# Patient Record
Sex: Male | Born: 1965 | Race: White | Hispanic: No | Smoking: Current every day smoker
Health system: Southern US, Community
[De-identification: ages and names within clinical notes are randomized; demographics above are authoritative.]

## PROBLEM LIST (undated history)

## (undated) ENCOUNTER — Emergency Department (HOSPITAL_COMMUNITY): Admission: EM | Source: Home / Self Care

## (undated) DIAGNOSIS — M4800 Spinal stenosis, site unspecified: Secondary | ICD-10-CM

## (undated) DIAGNOSIS — K219 Gastro-esophageal reflux disease without esophagitis: Secondary | ICD-10-CM

## (undated) DIAGNOSIS — F4310 Post-traumatic stress disorder, unspecified: Secondary | ICD-10-CM

## (undated) DIAGNOSIS — K5900 Constipation, unspecified: Secondary | ICD-10-CM

## (undated) DIAGNOSIS — I2699 Other pulmonary embolism without acute cor pulmonale: Secondary | ICD-10-CM

## (undated) DIAGNOSIS — I10 Essential (primary) hypertension: Secondary | ICD-10-CM

## (undated) DIAGNOSIS — IMO0002 Reserved for concepts with insufficient information to code with codable children: Secondary | ICD-10-CM

## (undated) DIAGNOSIS — IMO0001 Reserved for inherently not codable concepts without codable children: Secondary | ICD-10-CM

## (undated) DIAGNOSIS — Z720 Tobacco use: Secondary | ICD-10-CM

## (undated) DIAGNOSIS — F419 Anxiety disorder, unspecified: Secondary | ICD-10-CM

## (undated) DIAGNOSIS — F1910 Other psychoactive substance abuse, uncomplicated: Secondary | ICD-10-CM

## (undated) DIAGNOSIS — F32A Depression, unspecified: Secondary | ICD-10-CM

## (undated) DIAGNOSIS — G709 Myoneural disorder, unspecified: Secondary | ICD-10-CM

## (undated) DIAGNOSIS — R000 Tachycardia, unspecified: Secondary | ICD-10-CM

## (undated) DIAGNOSIS — F329 Major depressive disorder, single episode, unspecified: Secondary | ICD-10-CM

## (undated) DIAGNOSIS — R569 Unspecified convulsions: Secondary | ICD-10-CM

## (undated) DIAGNOSIS — R079 Chest pain, unspecified: Secondary | ICD-10-CM

## (undated) DIAGNOSIS — E669 Obesity, unspecified: Secondary | ICD-10-CM

## (undated) DIAGNOSIS — T1491XA Suicide attempt, initial encounter: Secondary | ICD-10-CM

## (undated) DIAGNOSIS — R32 Unspecified urinary incontinence: Secondary | ICD-10-CM

## (undated) HISTORY — DX: Chest pain, unspecified: R07.9

## (undated) HISTORY — DX: Other psychoactive substance abuse, uncomplicated: F19.10

## (undated) HISTORY — PX: WISDOM TOOTH EXTRACTION: SHX21

## (undated) HISTORY — DX: Major depressive disorder, single episode, unspecified: F32.9

## (undated) HISTORY — DX: Myoneural disorder, unspecified: G70.9

## (undated) HISTORY — DX: Depression, unspecified: F32.A

---

## 1998-02-05 ENCOUNTER — Emergency Department (HOSPITAL_COMMUNITY): Admission: EM | Admit: 1998-02-05 | Discharge: 1998-02-05 | Payer: Self-pay | Admitting: Internal Medicine

## 1998-05-21 ENCOUNTER — Emergency Department (HOSPITAL_COMMUNITY): Admission: EM | Admit: 1998-05-21 | Discharge: 1998-05-21 | Payer: Self-pay | Admitting: Emergency Medicine

## 1999-01-12 ENCOUNTER — Emergency Department (HOSPITAL_COMMUNITY): Admission: EM | Admit: 1999-01-12 | Discharge: 1999-01-12 | Payer: Self-pay | Admitting: *Deleted

## 1999-01-19 ENCOUNTER — Encounter: Payer: Self-pay | Admitting: Emergency Medicine

## 1999-01-19 ENCOUNTER — Emergency Department (HOSPITAL_COMMUNITY): Admission: EM | Admit: 1999-01-19 | Discharge: 1999-01-20 | Payer: Self-pay | Admitting: Emergency Medicine

## 1999-08-01 ENCOUNTER — Encounter: Payer: Self-pay | Admitting: Emergency Medicine

## 1999-08-01 ENCOUNTER — Emergency Department (HOSPITAL_COMMUNITY): Admission: EM | Admit: 1999-08-01 | Discharge: 1999-08-01 | Payer: Self-pay

## 1999-08-04 ENCOUNTER — Emergency Department (HOSPITAL_COMMUNITY): Admission: EM | Admit: 1999-08-04 | Discharge: 1999-08-04 | Payer: Self-pay | Admitting: Emergency Medicine

## 1999-08-04 ENCOUNTER — Encounter: Payer: Self-pay | Admitting: Emergency Medicine

## 2000-08-03 ENCOUNTER — Emergency Department (HOSPITAL_COMMUNITY): Admission: EM | Admit: 2000-08-03 | Discharge: 2000-08-03 | Payer: Self-pay | Admitting: Emergency Medicine

## 2000-09-05 ENCOUNTER — Emergency Department (HOSPITAL_COMMUNITY): Admission: EM | Admit: 2000-09-05 | Discharge: 2000-09-05 | Payer: Self-pay | Admitting: Emergency Medicine

## 2000-09-05 ENCOUNTER — Encounter: Payer: Self-pay | Admitting: Emergency Medicine

## 2000-09-16 ENCOUNTER — Encounter: Admission: RE | Admit: 2000-09-16 | Discharge: 2000-09-16 | Payer: Self-pay | Admitting: Internal Medicine

## 2000-09-30 ENCOUNTER — Encounter: Admission: RE | Admit: 2000-09-30 | Discharge: 2000-09-30 | Payer: Self-pay | Admitting: Internal Medicine

## 2000-10-17 ENCOUNTER — Encounter: Admission: RE | Admit: 2000-10-17 | Discharge: 2000-10-17 | Payer: Self-pay | Admitting: Internal Medicine

## 2000-11-19 ENCOUNTER — Encounter: Admission: RE | Admit: 2000-11-19 | Discharge: 2000-11-19 | Payer: Self-pay | Admitting: Internal Medicine

## 2000-12-02 ENCOUNTER — Encounter: Admission: RE | Admit: 2000-12-02 | Discharge: 2000-12-02 | Payer: Self-pay | Admitting: Internal Medicine

## 2001-03-13 ENCOUNTER — Encounter: Admission: RE | Admit: 2001-03-13 | Discharge: 2001-03-13 | Payer: Self-pay | Admitting: *Deleted

## 2001-03-31 ENCOUNTER — Emergency Department (HOSPITAL_COMMUNITY): Admission: EM | Admit: 2001-03-31 | Discharge: 2001-03-31 | Payer: Self-pay | Admitting: *Deleted

## 2001-04-10 ENCOUNTER — Encounter: Admission: RE | Admit: 2001-04-10 | Discharge: 2001-04-10 | Payer: Self-pay | Admitting: Internal Medicine

## 2001-04-21 ENCOUNTER — Emergency Department (HOSPITAL_COMMUNITY): Admission: EM | Admit: 2001-04-21 | Discharge: 2001-04-21 | Payer: Self-pay

## 2001-05-08 ENCOUNTER — Ambulatory Visit (HOSPITAL_COMMUNITY): Admission: RE | Admit: 2001-05-08 | Discharge: 2001-05-08 | Payer: Self-pay | Admitting: *Deleted

## 2001-05-17 ENCOUNTER — Ambulatory Visit (HOSPITAL_COMMUNITY): Admission: RE | Admit: 2001-05-17 | Discharge: 2001-05-17 | Payer: Self-pay | Admitting: *Deleted

## 2001-05-17 ENCOUNTER — Encounter: Payer: Self-pay | Admitting: *Deleted

## 2001-05-25 ENCOUNTER — Encounter: Admission: RE | Admit: 2001-05-25 | Discharge: 2001-05-25 | Payer: Self-pay | Admitting: *Deleted

## 2001-07-09 ENCOUNTER — Encounter: Admission: RE | Admit: 2001-07-09 | Discharge: 2001-07-09 | Payer: Self-pay | Admitting: Internal Medicine

## 2001-07-15 ENCOUNTER — Encounter: Admission: RE | Admit: 2001-07-15 | Discharge: 2001-07-15 | Payer: Self-pay | Admitting: Internal Medicine

## 2001-07-19 ENCOUNTER — Encounter: Payer: Self-pay | Admitting: Emergency Medicine

## 2001-07-19 ENCOUNTER — Emergency Department (HOSPITAL_COMMUNITY): Admission: EM | Admit: 2001-07-19 | Discharge: 2001-07-19 | Payer: Self-pay | Admitting: Emergency Medicine

## 2001-07-22 ENCOUNTER — Encounter: Admission: RE | Admit: 2001-07-22 | Discharge: 2001-07-22 | Payer: Self-pay | Admitting: Internal Medicine

## 2001-07-30 ENCOUNTER — Emergency Department (HOSPITAL_COMMUNITY): Admission: EM | Admit: 2001-07-30 | Discharge: 2001-07-30 | Payer: Self-pay | Admitting: Emergency Medicine

## 2001-07-30 ENCOUNTER — Encounter: Payer: Self-pay | Admitting: Emergency Medicine

## 2001-07-31 ENCOUNTER — Encounter: Admission: RE | Admit: 2001-07-31 | Discharge: 2001-07-31 | Payer: Self-pay | Admitting: Internal Medicine

## 2001-09-10 ENCOUNTER — Encounter: Admission: RE | Admit: 2001-09-10 | Discharge: 2001-09-10 | Payer: Self-pay | Admitting: Internal Medicine

## 2001-09-25 ENCOUNTER — Emergency Department (HOSPITAL_COMMUNITY): Admission: EM | Admit: 2001-09-25 | Discharge: 2001-09-25 | Payer: Self-pay | Admitting: Emergency Medicine

## 2001-09-30 ENCOUNTER — Encounter: Admission: RE | Admit: 2001-09-30 | Discharge: 2001-09-30 | Payer: Self-pay | Admitting: Internal Medicine

## 2001-11-23 ENCOUNTER — Encounter: Admission: RE | Admit: 2001-11-23 | Discharge: 2001-11-23 | Payer: Self-pay | Admitting: Internal Medicine

## 2002-01-11 ENCOUNTER — Encounter: Admission: RE | Admit: 2002-01-11 | Discharge: 2002-01-11 | Payer: Self-pay | Admitting: Internal Medicine

## 2002-02-21 ENCOUNTER — Emergency Department (HOSPITAL_COMMUNITY): Admission: EM | Admit: 2002-02-21 | Discharge: 2002-02-21 | Payer: Self-pay | Admitting: Emergency Medicine

## 2002-02-21 ENCOUNTER — Encounter: Payer: Self-pay | Admitting: *Deleted

## 2002-03-05 ENCOUNTER — Encounter: Admission: RE | Admit: 2002-03-05 | Discharge: 2002-03-05 | Payer: Self-pay | Admitting: Internal Medicine

## 2002-03-12 ENCOUNTER — Encounter: Admission: RE | Admit: 2002-03-12 | Discharge: 2002-03-12 | Payer: Self-pay | Admitting: Internal Medicine

## 2002-03-26 ENCOUNTER — Encounter: Admission: RE | Admit: 2002-03-26 | Discharge: 2002-03-26 | Payer: Self-pay | Admitting: Internal Medicine

## 2002-06-10 ENCOUNTER — Encounter: Admission: RE | Admit: 2002-06-10 | Discharge: 2002-06-10 | Payer: Self-pay | Admitting: Internal Medicine

## 2002-07-07 ENCOUNTER — Encounter: Admission: RE | Admit: 2002-07-07 | Discharge: 2002-07-07 | Payer: Self-pay | Admitting: Internal Medicine

## 2002-07-17 ENCOUNTER — Emergency Department (HOSPITAL_COMMUNITY): Admission: EM | Admit: 2002-07-17 | Discharge: 2002-07-17 | Payer: Self-pay | Admitting: Emergency Medicine

## 2002-08-06 ENCOUNTER — Encounter: Admission: RE | Admit: 2002-08-06 | Discharge: 2002-08-06 | Payer: Self-pay | Admitting: Internal Medicine

## 2002-09-16 ENCOUNTER — Encounter: Admission: RE | Admit: 2002-09-16 | Discharge: 2002-09-16 | Payer: Self-pay | Admitting: Internal Medicine

## 2002-11-10 ENCOUNTER — Encounter: Admission: RE | Admit: 2002-11-10 | Discharge: 2002-11-10 | Payer: Self-pay | Admitting: Internal Medicine

## 2002-11-10 ENCOUNTER — Encounter: Payer: Self-pay | Admitting: Internal Medicine

## 2002-11-10 ENCOUNTER — Ambulatory Visit (HOSPITAL_COMMUNITY): Admission: RE | Admit: 2002-11-10 | Discharge: 2002-11-10 | Payer: Self-pay | Admitting: Internal Medicine

## 2002-11-15 ENCOUNTER — Encounter: Payer: Self-pay | Admitting: Internal Medicine

## 2002-11-15 ENCOUNTER — Ambulatory Visit (HOSPITAL_COMMUNITY): Admission: RE | Admit: 2002-11-15 | Discharge: 2002-11-15 | Payer: Self-pay | Admitting: Internal Medicine

## 2002-11-17 ENCOUNTER — Encounter: Admission: RE | Admit: 2002-11-17 | Discharge: 2002-11-17 | Payer: Self-pay | Admitting: Internal Medicine

## 2002-11-30 ENCOUNTER — Encounter: Admission: RE | Admit: 2002-11-30 | Discharge: 2002-11-30 | Payer: Self-pay | Admitting: Internal Medicine

## 2002-12-19 ENCOUNTER — Emergency Department (HOSPITAL_COMMUNITY): Admission: EM | Admit: 2002-12-19 | Discharge: 2002-12-19 | Payer: Self-pay | Admitting: Emergency Medicine

## 2002-12-21 ENCOUNTER — Encounter: Payer: Self-pay | Admitting: Emergency Medicine

## 2002-12-21 ENCOUNTER — Inpatient Hospital Stay (HOSPITAL_COMMUNITY): Admission: EM | Admit: 2002-12-21 | Discharge: 2002-12-22 | Payer: Self-pay | Admitting: Emergency Medicine

## 2002-12-23 ENCOUNTER — Encounter: Admission: RE | Admit: 2002-12-23 | Discharge: 2002-12-23 | Payer: Self-pay | Admitting: Internal Medicine

## 2003-02-04 ENCOUNTER — Emergency Department (HOSPITAL_COMMUNITY): Admission: EM | Admit: 2003-02-04 | Discharge: 2003-02-04 | Payer: Self-pay | Admitting: Emergency Medicine

## 2003-04-07 ENCOUNTER — Encounter: Admission: RE | Admit: 2003-04-07 | Discharge: 2003-04-07 | Payer: Self-pay | Admitting: Internal Medicine

## 2003-04-13 ENCOUNTER — Encounter: Admission: RE | Admit: 2003-04-13 | Discharge: 2003-04-13 | Payer: Self-pay | Admitting: *Deleted

## 2003-06-13 ENCOUNTER — Encounter: Admission: RE | Admit: 2003-06-13 | Discharge: 2003-06-13 | Payer: Self-pay | Admitting: Internal Medicine

## 2003-06-16 ENCOUNTER — Encounter: Admission: RE | Admit: 2003-06-16 | Discharge: 2003-06-16 | Payer: Self-pay | Admitting: Internal Medicine

## 2003-06-16 ENCOUNTER — Ambulatory Visit (HOSPITAL_COMMUNITY): Admission: RE | Admit: 2003-06-16 | Discharge: 2003-06-16 | Payer: Self-pay | Admitting: Internal Medicine

## 2003-08-10 ENCOUNTER — Encounter: Admission: RE | Admit: 2003-08-10 | Discharge: 2003-08-10 | Payer: Self-pay | Admitting: Internal Medicine

## 2003-10-10 ENCOUNTER — Encounter: Admission: RE | Admit: 2003-10-10 | Discharge: 2003-10-10 | Payer: Self-pay | Admitting: Internal Medicine

## 2003-11-09 ENCOUNTER — Ambulatory Visit: Payer: Self-pay | Admitting: Internal Medicine

## 2003-12-19 ENCOUNTER — Emergency Department (HOSPITAL_COMMUNITY): Admission: EM | Admit: 2003-12-19 | Discharge: 2003-12-20 | Payer: Self-pay | Admitting: Emergency Medicine

## 2003-12-31 ENCOUNTER — Emergency Department (HOSPITAL_COMMUNITY): Admission: EM | Admit: 2003-12-31 | Discharge: 2003-12-31 | Payer: Self-pay | Admitting: Emergency Medicine

## 2004-02-08 ENCOUNTER — Emergency Department (HOSPITAL_COMMUNITY): Admission: EM | Admit: 2004-02-08 | Discharge: 2004-02-08 | Payer: Self-pay | Admitting: Emergency Medicine

## 2004-03-18 ENCOUNTER — Emergency Department (HOSPITAL_COMMUNITY): Admission: EM | Admit: 2004-03-18 | Discharge: 2004-03-18 | Payer: Self-pay | Admitting: Emergency Medicine

## 2004-05-22 ENCOUNTER — Emergency Department (HOSPITAL_COMMUNITY): Admission: EM | Admit: 2004-05-22 | Discharge: 2004-05-22 | Payer: Self-pay | Admitting: Emergency Medicine

## 2004-07-02 ENCOUNTER — Emergency Department (HOSPITAL_COMMUNITY): Admission: EM | Admit: 2004-07-02 | Discharge: 2004-07-02 | Payer: Self-pay | Admitting: Emergency Medicine

## 2004-08-20 ENCOUNTER — Ambulatory Visit: Payer: Self-pay | Admitting: Internal Medicine

## 2005-10-14 ENCOUNTER — Emergency Department (HOSPITAL_COMMUNITY): Admission: EM | Admit: 2005-10-14 | Discharge: 2005-10-14 | Payer: Self-pay | Admitting: Emergency Medicine

## 2005-10-20 ENCOUNTER — Emergency Department (HOSPITAL_COMMUNITY): Admission: EM | Admit: 2005-10-20 | Discharge: 2005-10-20 | Payer: Self-pay | Admitting: Emergency Medicine

## 2005-10-24 ENCOUNTER — Emergency Department (HOSPITAL_COMMUNITY): Admission: EM | Admit: 2005-10-24 | Discharge: 2005-10-24 | Payer: Self-pay | Admitting: Emergency Medicine

## 2005-10-28 ENCOUNTER — Emergency Department (HOSPITAL_COMMUNITY): Admission: EM | Admit: 2005-10-28 | Discharge: 2005-10-28 | Payer: Self-pay | Admitting: Emergency Medicine

## 2006-02-18 DIAGNOSIS — K219 Gastro-esophageal reflux disease without esophagitis: Secondary | ICD-10-CM | POA: Insufficient documentation

## 2006-02-18 DIAGNOSIS — F3289 Other specified depressive episodes: Secondary | ICD-10-CM | POA: Insufficient documentation

## 2006-02-18 DIAGNOSIS — R519 Headache, unspecified: Secondary | ICD-10-CM | POA: Insufficient documentation

## 2006-02-18 DIAGNOSIS — R51 Headache: Secondary | ICD-10-CM

## 2006-02-18 DIAGNOSIS — E7800 Pure hypercholesterolemia, unspecified: Secondary | ICD-10-CM | POA: Insufficient documentation

## 2006-02-18 DIAGNOSIS — F329 Major depressive disorder, single episode, unspecified: Secondary | ICD-10-CM

## 2006-02-18 DIAGNOSIS — E876 Hypokalemia: Secondary | ICD-10-CM

## 2006-03-06 ENCOUNTER — Ambulatory Visit: Payer: Self-pay | Admitting: Internal Medicine

## 2006-04-01 ENCOUNTER — Ambulatory Visit: Payer: Self-pay | Admitting: Internal Medicine

## 2006-04-03 ENCOUNTER — Ambulatory Visit (HOSPITAL_COMMUNITY): Admission: RE | Admit: 2006-04-03 | Discharge: 2006-04-03 | Payer: Self-pay | Admitting: Internal Medicine

## 2006-05-02 ENCOUNTER — Ambulatory Visit: Payer: Self-pay | Admitting: Internal Medicine

## 2006-05-22 ENCOUNTER — Ambulatory Visit: Payer: Self-pay | Admitting: Internal Medicine

## 2006-10-20 ENCOUNTER — Ambulatory Visit: Payer: Self-pay | Admitting: Internal Medicine

## 2006-11-19 ENCOUNTER — Encounter (INDEPENDENT_AMBULATORY_CARE_PROVIDER_SITE_OTHER): Payer: Self-pay | Admitting: *Deleted

## 2007-03-25 ENCOUNTER — Ambulatory Visit: Payer: Self-pay | Admitting: Internal Medicine

## 2007-03-25 LAB — CONVERTED CEMR LAB
BUN: 8 mg/dL (ref 6–23)
CO2: 27 meq/L (ref 19–32)
Chloride: 96 meq/L (ref 96–112)
Cholesterol: 184 mg/dL (ref 0–200)
Creatinine, Ser: 0.96 mg/dL (ref 0.40–1.50)
Glucose, Bld: 133 mg/dL — ABNORMAL HIGH (ref 70–99)
Sodium: 138 meq/L (ref 135–145)
Total Bilirubin: 0.7 mg/dL (ref 0.3–1.2)
Total CHOL/HDL Ratio: 5.6
VLDL: 58 mg/dL — ABNORMAL HIGH (ref 0–40)

## 2007-05-28 ENCOUNTER — Ambulatory Visit: Payer: Self-pay | Admitting: Internal Medicine

## 2007-05-29 ENCOUNTER — Ambulatory Visit: Payer: Self-pay | Admitting: *Deleted

## 2007-07-23 ENCOUNTER — Ambulatory Visit: Payer: Self-pay | Admitting: Internal Medicine

## 2007-08-26 ENCOUNTER — Emergency Department (HOSPITAL_COMMUNITY): Admission: EM | Admit: 2007-08-26 | Discharge: 2007-08-26 | Payer: Self-pay | Admitting: Emergency Medicine

## 2007-11-24 ENCOUNTER — Ambulatory Visit: Payer: Self-pay | Admitting: Internal Medicine

## 2007-11-25 ENCOUNTER — Encounter (INDEPENDENT_AMBULATORY_CARE_PROVIDER_SITE_OTHER): Payer: Self-pay | Admitting: Internal Medicine

## 2007-11-25 LAB — CONVERTED CEMR LAB
Leukocytes, UA: NEGATIVE
PSA: 0.33 ng/mL (ref 0.10–4.00)
Protein, ur: 30 mg/dL — AB

## 2008-01-07 ENCOUNTER — Emergency Department (HOSPITAL_COMMUNITY): Admission: EM | Admit: 2008-01-07 | Discharge: 2008-01-07 | Payer: Self-pay | Admitting: Emergency Medicine

## 2008-01-13 ENCOUNTER — Emergency Department (HOSPITAL_COMMUNITY): Admission: EM | Admit: 2008-01-13 | Discharge: 2008-01-13 | Payer: Self-pay | Admitting: Emergency Medicine

## 2008-03-27 ENCOUNTER — Emergency Department (HOSPITAL_COMMUNITY): Admission: EM | Admit: 2008-03-27 | Discharge: 2008-03-27 | Payer: Self-pay | Admitting: Emergency Medicine

## 2008-04-07 ENCOUNTER — Emergency Department (HOSPITAL_COMMUNITY): Admission: EM | Admit: 2008-04-07 | Discharge: 2008-04-08 | Payer: Self-pay | Admitting: Emergency Medicine

## 2008-04-11 ENCOUNTER — Emergency Department (HOSPITAL_COMMUNITY): Admission: EM | Admit: 2008-04-11 | Discharge: 2008-04-11 | Payer: Self-pay | Admitting: Emergency Medicine

## 2008-04-20 ENCOUNTER — Ambulatory Visit: Payer: Self-pay | Admitting: Internal Medicine

## 2008-11-15 ENCOUNTER — Ambulatory Visit: Payer: Self-pay | Admitting: Internal Medicine

## 2008-11-15 LAB — CONVERTED CEMR LAB
ALT: 25 units/L (ref 0–53)
AST: 32 units/L (ref 0–37)
Albumin: 3.9 g/dL (ref 3.5–5.2)
Alkaline Phosphatase: 107 units/L (ref 39–117)
CO2: 29 meq/L (ref 19–32)
Chloride: 101 meq/L (ref 96–112)
Glucose, Bld: 115 mg/dL — ABNORMAL HIGH (ref 70–99)
Total Bilirubin: 0.4 mg/dL (ref 0.3–1.2)
Triglycerides: 337 mg/dL — ABNORMAL HIGH (ref <150)

## 2009-07-24 ENCOUNTER — Ambulatory Visit: Payer: Self-pay | Admitting: Internal Medicine

## 2009-11-30 ENCOUNTER — Telehealth (INDEPENDENT_AMBULATORY_CARE_PROVIDER_SITE_OTHER): Payer: Self-pay | Admitting: *Deleted

## 2010-03-24 ENCOUNTER — Encounter: Payer: Self-pay | Admitting: *Deleted

## 2010-04-03 NOTE — Progress Notes (Signed)
   Walk in Patient Form Recieved " Pt dropped Off paper for Travis [name]" sent to [facility]  November 30, 2009 2:07 PM

## 2010-07-20 NOTE — Discharge Summary (Signed)
Travis [name], MINIX NO.:  [id number]   MEDICAL RECORD NO.:  [id number]                   PATIENT TYPE:  INP   LOCATION:  3738                                 FACILITY:  [facility]   PHYSICIAN:  Fransisco Hertz, M.D.               DATE OF BIRTH:  Jan 04, 1966   DATE OF ADMISSION:  12/21/2002  DATE OF DISCHARGE:  12/22/2002                                 DISCHARGE SUMMARY   DISCHARGE DIAGNOSES:  1. Chest pain, noncardiac.  2. Chronic low back pain.  3. Hypertension.  4. Depression.  5. Anxiety.   There were no consults or procedures done while in the hospital.   MEDICATIONS:  1. Hydrochlorothiazide 25 mg p.o. q.d.  2. Cozaar 100 mg p.o. q.d. The patient was to fill this for a few days and     discuss with Dr. Alfonse Alpers about the possibility of changing to an ACE     inhibitor secondary to cost reasons.  3. Vicodin HP; the patient given #6 with no refills as he has a pain     contract with Dr. Alfonse Alpers in the outpatient clinic.  4. K-Dur 10 mEq one p.o. q.d.  5. Prilosec OTC one q.d.   DIET:  Low fat, low sugar, and carbohydrate diet.   FOLLOW UP:  With Dr. Alfonse Alpers at 3:30 p.m. on December 23, 2002.   BRIEF HISTORY:  Travis [name] is a 45 year old white male who fell on his  sacrum two days prior to admission and then several hours later started  having chest pain and pressure like someone sitting on his chest. It was  nonradiating, substernal, associated with nausea but no diaphoresis,  worsened with staying in one position but was better with movement. He  states the pain was not relieved by his pain medications, Vicodin HP, which  he was taking at home but was relieved by the Vicodin in the emergency  department. He was admitted for rule out MI secondary to his story as well  as his risk factors of hypertension, obesity, and smoking two packs a day  for eight years for which he quit two months ago and positive family history  of cardiac disease in both  parents and siblings. He also had initial blood  pressure of 165/119 which was relieved by his home doses of his medications;  had not been taking them secondary to financial reasons for the past one  week.   HOSPITAL COURSE:  1. Chest pain. The patient was admitted to rule out MI with serial enzymes     as well as EKGs. MI was ruled out, and the patient was sent home with     precautions and reassurance and without chest pain as it was improved and     relieved with Vicodin.  2. Hypertension. Not well controlled likely secondary to not taking     medications secondary to  cost.  Is awaiting health insurance in the next     few weeks. Continued him on his home medications in hospital with     improved but still not ideally controlled of 140/98. He had an already     scheduled appointment the day after discharge with his primary care     Sharyah Bostwick, and they will discuss this at this time. He was discharged on     hospital day #2 in good     condition. He states that he was back at baseline with no chest pain and     would followup with Dr. Alfonse Alpers. Followup should especially focus on     patient's psychological depression and anxiety symptoms as this seems to     be quite an issue for him in his every day life as well as how he     functions with his chronic pain.      Ace Gins, MD                        Fransisco Hertz, M.D.    JS/MEDQ  D:  01/18/2003  T:  01/19/2003  Job:  [id number]

## 2010-12-04 LAB — POCT CARDIAC MARKERS
CKMB, poc: <1 — ABNORMAL LOW
Myoglobin, poc: 99.2

## 2010-12-04 LAB — POCT I-STAT, CHEM 8
Chloride: 101
Creatinine, Ser: 0.8
Glucose, Bld: 126 — ABNORMAL HIGH
HCT: 48
Potassium: 3.4 — ABNORMAL LOW

## 2010-12-11 ENCOUNTER — Other Ambulatory Visit: Payer: Self-pay | Admitting: Internal Medicine

## 2010-12-11 NOTE — Telephone Encounter (Signed)
Not an [facility] patient.

## 2010-12-17 ENCOUNTER — Other Ambulatory Visit: Payer: Self-pay | Admitting: Internal Medicine

## 2011-03-20 ENCOUNTER — Emergency Department (HOSPITAL_COMMUNITY)
Admission: EM | Admit: 2011-03-20 | Discharge: 2011-03-20 | Disposition: A | Discharge status: Home / Self Care | Payer: Self-pay | Attending: Emergency Medicine | Admitting: Emergency Medicine

## 2011-03-20 ENCOUNTER — Encounter (HOSPITAL_COMMUNITY): Payer: Self-pay | Admitting: Emergency Medicine

## 2011-03-20 ENCOUNTER — Other Ambulatory Visit: Payer: Self-pay

## 2011-03-20 DIAGNOSIS — F410 Panic disorder [episodic paroxysmal anxiety] without agoraphobia: Secondary | ICD-10-CM

## 2011-03-20 DIAGNOSIS — I10 Essential (primary) hypertension: Secondary | ICD-10-CM | POA: Insufficient documentation

## 2011-03-20 DIAGNOSIS — R4589 Other symptoms and signs involving emotional state: Secondary | ICD-10-CM | POA: Insufficient documentation

## 2011-03-20 DIAGNOSIS — I498 Other specified cardiac arrhythmias: Secondary | ICD-10-CM | POA: Insufficient documentation

## 2011-03-20 HISTORY — DX: Essential (primary) hypertension: I10

## 2011-03-20 MED ORDER — LORAZEPAM 1 MG PO TABS
1.0000 mg | ORAL_TABLET | Freq: Once | ORAL | Status: AC
  Administered 2011-03-20: 1 mg via ORAL
  Filled 2011-03-20: qty 1

## 2011-03-20 MED ORDER — LORAZEPAM 1 MG PO TABS: 1.0000 mg | ORAL_TABLET | Freq: Three times a day (TID) | ORAL | Status: AC | PRN

## 2011-03-20 NOTE — ED Notes (Signed)
Pt states that his father had a stroke on fri and is in the hospital and that today he began having a racing heart and breathing heavy, states that he has been dx with panic attacks and is suppose to see md on 1/21 for this but states that he can not calm down, was on bp meds and ativan previously but not now.

## 2011-03-20 NOTE — ED Provider Notes (Signed)
History     CSN: [id number]  Arrival date & time 03/20/11  0825   First MD Initiated Contact with Patient 03/20/11 [phone]      Chief Complaint  Patient presents with  . Panic Attack    (Consider location/radiation/quality/duration/timing/severity/associated sxs/prior treatment) HPI  46 year old male presenting to the ED complaining of panic attack. Patient states he has a history of panic attack. His father was recently diagnosed with having a stroke and currently in the hospital. Patient believes that this may be the precipitant to his panic attack. He described sensation of heart beating fast, increase anxiety, and unable to sleep.  Patient denies smoking, alcohol use, or recreational drug use. He denies homicidal or suicidal ideation. He denies any change in his medication. Patient denies headache, chest pain, abdominal pain, or dysuria. Patient denies rash. Patient is scheduled to see his primary care doctor on January 21 for further management of his panic attack.  Past Medical History  Diagnosis Date  . Thyroid disease   . Hypertension     No past surgical history on file.  No family history on file.  History  Substance Use Topics  . Smoking status: Former Games developer  . Smokeless tobacco: Not on file  . Alcohol Use: No      Review of Systems  All other systems reviewed and are negative.    Allergies  Penicillins  Home Medications   Current Outpatient Rx  Name Route Sig Dispense Refill  . ACETAMINOPHEN 500 MG PO TABS Oral Take 1,000 mg by mouth every 6 (six) hours as needed. For pain.    Marland Kitchen VITAMIN B-12 PO Oral Take 1 tablet by mouth daily.    Marland Kitchen HYDROCHLOROTHIAZIDE PO Oral Take 1 tablet by mouth daily.    . IBUPROFEN 200 MG PO TABS Oral Take 600 mg by mouth every 6 (six) hours as needed. For pain.    Marland Kitchen FISH OIL PO Oral Take 1 capsule by mouth daily.      BP 148/102  Pulse 122  Temp(Src) 98.4 F (36.9 C) (Oral)  Resp 24  SpO2 98%  Physical Exam  Nursing  note and vitals reviewed. Constitutional:       Awake, alert, nontoxic appearance  HENT:  Head: Atraumatic.  Eyes: Right eye exhibits no discharge. Left eye exhibits no discharge.  Neck: Neck supple.  Pulmonary/Chest: Effort normal. He exhibits no tenderness.  Abdominal: There is no tenderness. There is no rebound.  Musculoskeletal: He exhibits no tenderness.       Baseline ROM, no obvious new focal weakness  Neurological:       Mental status and motor strength appears baseline for patient and situation  Skin: No rash noted.  Psychiatric: He has a normal mood and affect.    ED Course  Procedures (including critical care time)  Labs Reviewed - No data to display No results found.   No diagnosis found.   Date: 03/20/2011  Rate: 107  Rhythm: sinus tachycardia  QRS Axis: normal  Intervals: normal  ST/T Wave abnormalities: nonspecific T wave changes  Conduction Disutrbances: normal  Narrative Interpretation:   Old EKG Reviewed: unchanged    MDM  Hx of panic attack, with recent exacerbation.  No concerning behavioral red flags finding.  Tachycardia and tremors but otherwise in no other acute distress.  PO ativan given.  Will give short course of ativan at discharge.  Pt agrees to f/u with PCP as previously scheduled for further evaluation.    11:32 AM Patient  state he felt much better after the administrations of Ativan. Patient is to follow up with his doctor shortly.      Fayrene Helper, PA-C 03/20/11 1132

## 2011-03-21 NOTE — ED Provider Notes (Signed)
Medical screening examination/treatment/procedure(s) were conducted as a shared visit with non-physician practitioner(s) and myself.  I personally evaluated the patient during the encounter  Pt c/o 'anxiety attack', states long hx same and feels the same. Recent stressors. Denies depression or thoughts of self harm. No head intol, sweats, wt change. No change in meds x states has no medication for anxiety. Alert, anxious. Chest cta. No thyromegaly. Rrr. No g/r/m. abd soft nt.   Suzi Roots, MD 03/21/11 0730

## 2011-04-15 ENCOUNTER — Emergency Department (HOSPITAL_COMMUNITY)
Admission: EM | Admit: 2011-04-15 | Discharge: 2011-04-15 | Disposition: A | Discharge status: Home / Self Care | Payer: Self-pay | Attending: Emergency Medicine | Admitting: Emergency Medicine

## 2011-04-15 ENCOUNTER — Encounter (HOSPITAL_COMMUNITY): Payer: Self-pay | Admitting: Emergency Medicine

## 2011-04-15 DIAGNOSIS — F419 Anxiety disorder, unspecified: Secondary | ICD-10-CM

## 2011-04-15 DIAGNOSIS — I10 Essential (primary) hypertension: Secondary | ICD-10-CM | POA: Insufficient documentation

## 2011-04-15 DIAGNOSIS — F17200 Nicotine dependence, unspecified, uncomplicated: Secondary | ICD-10-CM | POA: Insufficient documentation

## 2011-04-15 DIAGNOSIS — E079 Disorder of thyroid, unspecified: Secondary | ICD-10-CM | POA: Insufficient documentation

## 2011-04-15 DIAGNOSIS — F411 Generalized anxiety disorder: Secondary | ICD-10-CM | POA: Insufficient documentation

## 2011-04-15 DIAGNOSIS — Z79899 Other long term (current) drug therapy: Secondary | ICD-10-CM | POA: Insufficient documentation

## 2011-04-15 HISTORY — DX: Tachycardia, unspecified: R00.0

## 2011-04-15 HISTORY — DX: Anxiety disorder, unspecified: F41.9

## 2011-04-15 MED ORDER — LORAZEPAM 1 MG PO TABS: 1.0000 mg | ORAL_TABLET | Freq: Three times a day (TID) | ORAL | Status: AC | PRN

## 2011-04-15 NOTE — ED Notes (Signed)
Pt stated that he has had and increase in financial, medical, and family concerns. "scheduled for appt at [facility]"

## 2011-04-15 NOTE — ED Provider Notes (Signed)
History     CSN: [id number]  Arrival date & time 04/15/11  0907   First MD Initiated Contact with Patient 04/15/11 [phone]      Chief Complaint  Patient presents with  . Anxiety    Pt reports hx of anxiety.Increased shaking over last 24 hrs. Out of medication. [facility] pt    (Consider location/radiation/quality/duration/timing/severity/associated sxs/prior treatment) Patient is a 46 y.o. male presenting with anxiety. The history is provided by the patient.  Anxiety   The patient states that he has had increased anxiety over the last week due to some personal financial issues and his mother moving in with him. He states that he has no suicidal or homicidal tendencies. The patient states that he usually takes ativan for his anxiety. He states that he has an appointment set up with his doctor and [employer] psych services.  No hallucinations or substance abuse Past Medical History  Diagnosis Date  . Thyroid disease   . Hypertension   . Anxiety   . Tachycardia - pulse     History reviewed. No pertinent past surgical history.  Family History  Problem Relation Age of Onset  . Stroke Mother   . Hypertension Mother   . Stroke Father   . Hypertension Father     History  Substance Use Topics  . Smoking status: Current Some Day Smoker  . Smokeless tobacco: Not on file  . Alcohol Use: No      Review of Systems All pertinent positives and negatives reviewed in the history of present illness  Allergies  Penicillins  Home Medications   Current Outpatient Rx  Name Route Sig Dispense Refill  . ACETAMINOPHEN 500 MG PO TABS Oral Take 1,000 mg by mouth every 6 (six) hours as needed. For pain.    Marland Kitchen VITAMIN B-12 PO Oral Take 1 tablet by mouth daily.    Marland Kitchen HYDROCHLOROTHIAZIDE 25 MG PO TABS Oral Take 25 mg by mouth daily.    . IBUPROFEN 200 MG PO TABS Oral Take 600 mg by mouth every 6 (six) hours as needed. For pain.    Marland Kitchen LISINOPRIL 20 MG PO TABS Oral Take 20 mg by mouth daily.      Marland Kitchen FISH OIL PO Oral Take 1 capsule by mouth daily.      BP 145/103  Pulse 120  Temp(Src) 98.1 F (36.7 C) (Oral)  Resp 16  Wt 300 lb (136.079 kg)  SpO2 93%  Physical Exam  Constitutional: He appears well-developed and well-nourished. No distress.  Cardiovascular: Normal rate, regular rhythm and normal heart sounds.  Exam reveals no gallop and no friction rub.   No murmur heard. Pulmonary/Chest: Effort normal and breath sounds normal. No respiratory distress. He has no wheezes. He has no rales.  Psychiatric: His speech is normal and behavior is normal. Judgment and thought content normal. His mood appears anxious. Cognition and memory are normal. He does not exhibit a depressed mood.    ED Course  Procedures (including critical care time)  The patient asked to call his PCP and psych doctor for possible move of his appointment. The patient is stable at this time. Told to return here as needed.         MDM          Carlyle Dolly, PA-C 04/15/11 1014

## 2011-04-15 NOTE — ED Provider Notes (Signed)
Medical screening examination/treatment/procedure(s) were performed by non-physician practitioner and as supervising physician I was immediately available for consultation/collaboration. Devoria Albe, MD, Armando Gang   Ward Givens, MD 04/15/11 [phone]

## 2011-05-02 ENCOUNTER — Encounter (HOSPITAL_COMMUNITY): Payer: Self-pay | Admitting: *Deleted

## 2011-05-02 ENCOUNTER — Emergency Department (HOSPITAL_COMMUNITY)
Admission: EM | Admit: 2011-05-02 | Discharge: 2011-05-02 | Disposition: A | Discharge status: Home / Self Care | Payer: Self-pay | Attending: Emergency Medicine | Admitting: Emergency Medicine

## 2011-05-02 DIAGNOSIS — F410 Panic disorder [episodic paroxysmal anxiety] without agoraphobia: Secondary | ICD-10-CM | POA: Insufficient documentation

## 2011-05-02 DIAGNOSIS — F17200 Nicotine dependence, unspecified, uncomplicated: Secondary | ICD-10-CM | POA: Insufficient documentation

## 2011-05-02 MED ORDER — LORAZEPAM 1 MG PO TABS: 1.0000 mg | ORAL_TABLET | Freq: Three times a day (TID) | ORAL | Status: AC | PRN
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
## 2011-05-02 NOTE — Discharge Instructions (Signed)
Anxiety and Panic Attacks     Anxiety is your body's way of reacting to real danger or something you think is a danger. It may be fear or worry over a situation like losing your job. Sometimes the cause is not known. A panic attack is made up of physical signs like sweating, shaking, or chest pain. Anxiety and panic attacks may start suddenly. They may be strong. They may come at any time of day, even while sleeping. They may come at any time of life. Panic attacks are scary, but they do not harm you physically.   HOME CARE  · Avoid any known causes of your anxiety.   · Try to relax. Yoga may help. Tell yourself everything will be okay.   · Exercise often.   · Get expert advice and help (therapy) to stop anxiety or attacks from happening.   · Avoid caffeine, alcohol, and drugs.   · Only take medicine as told by your doctor.   GET HELP RIGHT AWAY IF:  · Your attacks seem different than normal attacks.   · Your problems are getting worse or concern you.   MAKE SURE YOU:  · Understand these instructions.   · Will watch your condition.   · Will get help right away if you are not doing well or get worse.   Document Released: 03/23/2010 Document Revised: 10/31/2010 Document Reviewed: 03/23/2010  ExitCare® Patient Information ©2012 [employer].

## 2011-05-02 NOTE — ED Notes (Signed)
Pt states "I am supposed to go to court for disability in 2 wks, I'm trying to get [employer], the last time I was here I got 15 ativan and I've made them last until now"

## 2011-05-02 NOTE — ED Provider Notes (Signed)
History     CSN: [id number]  Arrival date & time 05/02/11  1211   First MD Initiated Contact with Patient 05/02/11 1433      Chief Complaint  Patient presents with  . Panic Attack    (Consider location/radiation/quality/duration/timing/severity/associated sxs/prior treatment) HPI  46 year old male with history of anxiety, panic attack, and multiple prior ER visits for panic attack is here presents ED with chief complaints of panic attack. She states that for the past 3 months he has been having increased panic attack. He accounts for this attack due to family stress with his dad having strokes and other family issues.  States that he feels like he may die whenever the panic attack  strikes oh he knows that he will not die. He denies SI/HI. He denies self medicating with anything else. He has been to the ED 3 times recently for same complaints.  Each time he was instructed to followup with a primary care provider for further management. Patient states [facility] has [facility] however he is no longer able to afford care. He states "I am scheduling for a disability court in 2 weeks so that I can get Medicaid".  Denies fever, headache, chest pain, shortness of breath, nausea, vomiting, diarrhea, abdominal pain.  Past Medical History  Diagnosis Date  . Thyroid disease   . Hypertension   . Anxiety   . Tachycardia - pulse     History reviewed. No pertinent past surgical history.  Family History  Problem Relation Age of Onset  . Stroke Mother   . Hypertension Mother   . Stroke Father   . Hypertension Father     History  Substance Use Topics  . Smoking status: Current Some Day Smoker -- 0.5 packs/day  . Smokeless tobacco: Not on file  . Alcohol Use: No      Review of Systems  All other systems reviewed and are negative.    Allergies  Penicillins  Home Medications   Current Outpatient Rx  Name Route Sig Dispense Refill  . ACETAMINOPHEN 500 MG PO TABS Oral  Take 1,000 mg by mouth every 6 (six) hours as needed. For pain.    Marland Kitchen VITAMIN B-12 PO Oral Take 1 tablet by mouth daily.    Marland Kitchen HYDROCHLOROTHIAZIDE 25 MG PO TABS Oral Take 25 mg by mouth daily.    . IBUPROFEN 200 MG PO TABS Oral Take 600 mg by mouth every 6 (six) hours as needed. For pain.    Marland Kitchen LISINOPRIL 20 MG PO TABS Oral Take 20 mg by mouth daily.    Marland Kitchen FISH OIL PO Oral Take 1 capsule by mouth daily.      BP 166/112  Pulse 84  Temp(Src) 98.8 F (37.1 C) (Oral)  Resp 18  Wt 308 lb (139.708 kg)  SpO2 99%  Physical Exam  Nursing note and vitals reviewed. Constitutional: He appears well-developed and well-nourished. No distress.       Awake, alert, nontoxic appearance  HENT:  Head: Atraumatic.  Eyes: Conjunctivae are normal. Right eye exhibits no discharge. Left eye exhibits no discharge.  Neck: Normal range of motion. Neck supple.  Cardiovascular: Normal rate and regular rhythm.   Pulmonary/Chest: Effort normal. No respiratory distress. He exhibits no tenderness.  Abdominal: Soft. There is no tenderness. There is no rebound.  Musculoskeletal: He exhibits no tenderness.       ROM appears intact, no obvious focal weakness  Neurological: He is alert.  Skin: Skin is warm and  dry. No rash noted.  Psychiatric: He has a normal mood and affect. His speech is normal and behavior is normal. Judgment and thought content normal. His mood appears not anxious. Cognition and memory are normal.    ED Course  Procedures (including critical care time)  Labs Reviewed - No data to display No results found.   No diagnosis found.    MDM  Patient with history of panic attack, however he is in no acute distress. Moderate amount of time was spent discussing the risk of prolonged benzodiazepine use, including risk, with emphasis dependencies and withdrawal. I also want to encourage patient to have further follow up by a specialist to manage his chronic condition and to treat the source of his panic  attack. Patient promised to seek further care for his condition. I agree to provide with him in a short course of Ativan and states that this is probably the last time will offer benzodiazepine in the ER setting.  Pt voice understanding and agrees with plan.  He is currently safe to be discharge.    Resource information was given by nurse with clear follow up instruction.      Fayrene Helper, PA-C 05/02/11 1457  Fayrene Helper, PA-C 05/02/11 1459

## 2011-05-02 NOTE — ED Provider Notes (Signed)
Medical screening examination/treatment/procedure(s) were performed by non-physician practitioner and as supervising physician I was immediately available for consultation/collaboration. Devoria Albe, MD, Armando Gang   Ward Givens, MD 05/02/11 [phone]

## 2011-05-17 ENCOUNTER — Encounter (HOSPITAL_COMMUNITY): Payer: Self-pay | Admitting: Emergency Medicine

## 2011-05-17 ENCOUNTER — Emergency Department (HOSPITAL_COMMUNITY)
Admission: EM | Admit: 2011-05-17 | Discharge: 2011-05-18 | Disposition: A | Discharge status: Other Acute Inpatient Hospital | Payer: Self-pay | Attending: Family Medicine | Admitting: Family Medicine

## 2011-05-17 DIAGNOSIS — Z79899 Other long term (current) drug therapy: Secondary | ICD-10-CM | POA: Insufficient documentation

## 2011-05-17 DIAGNOSIS — M549 Dorsalgia, unspecified: Secondary | ICD-10-CM | POA: Insufficient documentation

## 2011-05-17 DIAGNOSIS — I10 Essential (primary) hypertension: Secondary | ICD-10-CM | POA: Insufficient documentation

## 2011-05-17 DIAGNOSIS — F1110 Opioid abuse, uncomplicated: Secondary | ICD-10-CM | POA: Insufficient documentation

## 2011-05-17 LAB — COMPREHENSIVE METABOLIC PANEL WITH GFR
Alkaline Phosphatase: 118 U/L — ABNORMAL HIGH (ref 39–117)
BUN: 7 mg/dL (ref 6–23)
Calcium: 8.8 mg/dL (ref 8.4–10.5)
Creatinine, Ser: 0.69 mg/dL (ref 0.50–1.35)
GFR calc Af Amer: >90 mL/min (ref >90)
Glucose, Bld: 125 mg/dL — ABNORMAL HIGH (ref 70–99)
Potassium: 3.9 meq/L (ref 3.5–5.1)
Total Protein: 7.1 g/dL (ref 6.0–8.3)

## 2011-05-17 LAB — RAPID URINE DRUG SCREEN, HOSP PERFORMED
Amphetamines: NOT DETECTED
Barbiturates: NOT DETECTED
Benzodiazepines: POSITIVE — AB
Cocaine: NOT DETECTED
Opiates: NOT DETECTED
Tetrahydrocannabinol: NOT DETECTED

## 2011-05-17 LAB — DIFFERENTIAL
Basophils Absolute: 0 10*3/uL (ref 0.0–0.1)
Basophils Relative: 0 % (ref 0–1)
Eosinophils Absolute: 0.3 K/uL (ref 0.0–0.7)
Eosinophils Relative: 4 % (ref 0–5)
Lymphocytes Relative: 46 % (ref 12–46)
Lymphs Abs: 2.9 K/uL (ref 0.7–4.0)
Monocytes Absolute: 0.5 K/uL (ref 0.1–1.0)
Monocytes Relative: 8 % (ref 3–12)
Neutro Abs: 2.7 10*3/uL (ref 1.7–7.7)
Neutrophils Relative %: 42 % — ABNORMAL LOW (ref 43–77)

## 2011-05-17 LAB — CBC
HCT: 40.3 % (ref 39.0–52.0)
Hemoglobin: 13 g/dL (ref 13.0–17.0)
MCH: 28.7 pg (ref 26.0–34.0)
MCHC: 32.3 g/dL (ref 30.0–36.0)
MCV: 89 fL (ref 78.0–100.0)
Platelets: 128 10*3/uL — ABNORMAL LOW (ref 150–400)
RBC: 4.53 MIL/uL (ref 4.22–5.81)
RDW: 14.2 % (ref 11.5–15.5)
WBC: 6.4 10*3/uL (ref 4.0–10.5)

## 2011-05-17 LAB — ETHANOL: Alcohol, Ethyl (B): <11 mg/dL (ref 0–11)

## 2011-05-17 LAB — COMPREHENSIVE METABOLIC PANEL
ALT: 17 U/L (ref 0–53)
AST: 23 U/L (ref 0–37)
Albumin: 3.3 g/dL — ABNORMAL LOW (ref 3.5–5.2)
CO2: 30 mEq/L (ref 19–32)
Chloride: 100 mEq/L (ref 96–112)
GFR calc non Af Amer: >90 mL/min (ref >90)
Sodium: 136 mEq/L (ref 135–145)
Total Bilirubin: 0.3 mg/dL (ref 0.3–1.2)

## 2011-05-17 MED ORDER — ALUM & MAG HYDROXIDE-SIMETH 200-200-20 MG/5ML PO SUSP
30.0000 mL | ORAL | Status: DC | PRN
  Administered 2011-05-18: 30 mL via ORAL
  Filled 2011-05-17: qty 30

## 2011-05-17 MED ORDER — ZOLPIDEM TARTRATE 5 MG PO TABS: 5.0000 mg | ORAL_TABLET | Freq: Every evening | ORAL | Status: DC | PRN

## 2011-05-17 MED ORDER — LORAZEPAM 1 MG PO TABS
1.0000 mg | ORAL_TABLET | Freq: Three times a day (TID) | ORAL | Status: DC | PRN
  Administered 2011-05-17 – 2011-05-18 (×2): 1 mg via ORAL
  Filled 2011-05-17 (×2): qty 1

## 2011-05-17 MED ORDER — ACETAMINOPHEN 325 MG PO TABS: 650.0000 mg | ORAL_TABLET | ORAL | Status: DC | PRN

## 2011-05-17 MED ORDER — IBUPROFEN 200 MG PO TABS: 600.0000 mg | ORAL_TABLET | Freq: Three times a day (TID) | ORAL | Status: DC | PRN

## 2011-05-17 MED ORDER — ONDANSETRON HCL 8 MG PO TABS: 4.0000 mg | ORAL_TABLET | Freq: Three times a day (TID) | ORAL | Status: DC | PRN

## 2011-05-17 NOTE — ED Notes (Signed)
Diet ordered 

## 2011-05-17 NOTE — ED Provider Notes (Signed)
History     CSN: [id number]  Arrival date & time 05/17/11  8469   First MD Initiated Contact with Patient 05/17/11 [phone]      Chief Complaint  Patient presents with  . Medical Clearance    (Consider location/radiation/quality/duration/timing/severity/associated sxs/prior treatment) The history is provided by the patient.   46 year old male with a history of hypertension and anxiety presents to the emergency department with a chief complaint of methadone addiction and a request for medical clearance so that he can have detox at [facility] in [place]. Denies SI, HI, or hallucinations. Denies use of alcohol or any recreational drugs. C/o back pain, which he reports is chronic and unchanged without new injury.  Past Medical History  Diagnosis Date  . Thyroid disease   . Hypertension   . Anxiety   . Tachycardia - pulse     History reviewed. No pertinent past surgical history.  Family History  Problem Relation Age of Onset  . Stroke Mother   . Hypertension Mother   . Stroke Father   . Hypertension Father     History  Substance Use Topics  . Smoking status: Current Some Day Smoker -- 0.5 packs/day  . Smokeless tobacco: Not on file  . Alcohol Use: No      Review of Systems  Constitutional: Negative for fever and chills.  HENT: Negative for sore throat, neck pain and neck stiffness.   Respiratory: Negative for cough and shortness of breath.   Cardiovascular: Negative for chest pain.  Gastrointestinal: Negative for nausea, vomiting and abdominal pain.  Genitourinary: Negative for dysuria and flank pain.  Musculoskeletal: Positive for back pain. Negative for gait problem.  Skin: Negative for rash and wound.  Neurological: Negative for dizziness, syncope, weakness and headaches.  Psychiatric/Behavioral:       See HPI, otherwise negative    Allergies  Penicillins  Home Medications   Current Outpatient Rx  Name Route Sig Dispense Refill  . ACETAMINOPHEN 500 MG PO TABS  Oral Take 1,000 mg by mouth every 6 (six) hours as needed. For pain.    Marland Kitchen HYDROCHLOROTHIAZIDE 25 MG PO TABS Oral Take 25 mg by mouth daily.    . IBUPROFEN 200 MG PO TABS Oral Take 600 mg by mouth every 6 (six) hours as needed. For pain.    Marland Kitchen LISINOPRIL 20 MG PO TABS Oral Take 20 mg by mouth daily.    Marland Kitchen LORAZEPAM 1 MG PO TABS Oral Take 1 mg by mouth 3 (three) times daily.    Marland Kitchen METHADONE HCL 10 MG/ML PO CONC Oral Take 110 mg by mouth daily.    Marland Kitchen VITAMIN B-12 PO Oral Take 1 tablet by mouth daily.    Marland Kitchen FISH OIL PO Oral Take 1 capsule by mouth daily.      BP 138/88  Pulse 96  Temp 98.8 F (37.1 C)  Resp 16  SpO2 99%  Physical Exam  Nursing note and vitals reviewed. Constitutional: He is oriented to person, place, and time. He appears well-developed and well-nourished. No distress.  HENT:  Head: Normocephalic and atraumatic.  Right Ear: External ear normal.  Left Ear: External ear normal.  Mouth/Throat: Oropharynx is clear and moist.  Eyes: Conjunctivae are normal. Pupils are equal, round, and reactive to light.  Neck: Normal range of motion. Neck supple.  Cardiovascular: Normal rate and regular rhythm.   Pulmonary/Chest: Effort normal. No respiratory distress.  Abdominal: Soft. Bowel sounds are normal. He exhibits no distension. There is no tenderness.  Musculoskeletal:  Normal range of motion. He exhibits no edema and no tenderness.  Neurological: He is alert and oriented to person, place, and time. No cranial nerve deficit.  Skin: Skin is warm and dry.  Psychiatric: He has a normal mood and affect.    ED Course  Procedures (including critical care time)  Labs Reviewed  CBC - Abnormal; Notable for the following:    Platelets 128 (*)    All other components within normal limits  DIFFERENTIAL - Abnormal; Notable for the following:    Neutrophils Relative 42 (*)    All other components within normal limits  COMPREHENSIVE METABOLIC PANEL - Abnormal; Notable for the following:     Glucose, Bld 125 (*)    Albumin 3.3 (*)    Alkaline Phosphatase 118 (*)    All other components within normal limits  URINE RAPID DRUG SCREEN (HOSP PERFORMED) - Abnormal; Notable for the following:    Benzodiazepines POSITIVE (*)    All other components within normal limits  ETHANOL   No results found.     MDM  Medical clearance, voluntary, would like to go to [facility]. Clearance labs ordered.    12:17 PM Spoke with Irving Burton, ACT assessment counselor who will see pt as he is medically cleared.   4:04 PM Report given to EDP Steinl, who will set dispo once pt is cleared to go to [facility] (or other facility if unable to be placed at [facility]).  Shaaron Adler, [place] 05/17/11 1605

## 2011-05-17 NOTE — BH Assessment (Addendum)
Assessment Note   Travis [name] is an 46 y.o. male that is requesting detox from Methadone.  Pt reports daily use of 110 mg Methadone by the [facility].  Pt has begun to seek treatment for his use because "I cannot afford it and I cannot function with it."  Pt reports using this for three years after attempting to quit his Opiate Dependence, including Hydrocodone and Oxycontin.  Pt reports increased panic attacks x3 when attempting to quit and withdrawals, including crawling skin, nausea, shakiness, fever/chills, and decreased appetite.  Pt denies any SI, HI, or any active psychosis and is able to contract for safety.  Pt is in the process of applying for disability, but has not yet been accepted.  Writer will refer to [facility] and [facility] for possible inpatient treatment and detox.  Dr. And nursing staff notified and agreeable with pending disposition.     Axis I: Opioid Dependence and GAD Axis II: Deferred Axis III:  Past Medical History  Diagnosis Date  . Thyroid disease   . Hypertension   . Anxiety   . Tachycardia - pulse    Axis IV: other psychosocial or environmental problems, problems related to social environment and problems with primary support group Axis V: 31-40 impairment in reality testing  Past Medical History:  Past Medical History  Diagnosis Date  . Thyroid disease   . Hypertension   . Anxiety   . Tachycardia - pulse     History reviewed. No pertinent past surgical history.  Family History:  Family History  Problem Relation Age of Onset  . Stroke Mother   . Hypertension Mother   . Stroke Father   . Hypertension Father     Social History:  reports that he has been smoking.  He does not have any smokeless tobacco history on file. He reports that he does not drink alcohol or use illicit drugs.  Additional Social History:  Alcohol / Drug Use Pain Medications: yes Prescriptions: not prescribed Over the Counter: no History of alcohol / drug use?:  Yes Substance #1 Name of Substance 1: Methadone 1 - Age of First Use: 37 1 - Amount (size/oz): 110mg  1 - Frequency: QD 1 - Duration: six years + 1 - Last Use / Amount: yesterday Substance #2 Name of Substance 2: Opiates  2 - Age of First Use: 20's 2 - Amount (size/oz): Codones and Oxycontin "as much as I could get" 2 - Frequency: QD 2 - Duration: years ago 2 - Last Use / Amount: several years ago Allergies:  Allergies  Allergen Reactions  . Penicillins Hives, Itching and Other (See Comments)    Hallucinations.    Home Medications:  No current facility-administered medications on file as of 05/17/2011.   Medications Prior to Admission  Medication Sig Dispense Refill  . acetaminophen (TYLENOL) 500 MG tablet Take 1,000 mg by mouth every 6 (six) hours as needed. For pain.      . hydrochlorothiazide (HYDRODIURIL) 25 MG tablet Take 25 mg by mouth daily.      Marland Kitchen ibuprofen (ADVIL,MOTRIN) 200 MG tablet Take 600 mg by mouth every 6 (six) hours as needed. For pain.      Marland Kitchen lisinopril (PRINIVIL,ZESTRIL) 20 MG tablet Take 20 mg by mouth daily.      . Cyanocobalamin (VITAMIN B-12 PO) Take 1 tablet by mouth daily.      . Omega-3 Fatty Acids (FISH OIL PO) Take 1 capsule by mouth daily.        OB/GYN  Status:  No LMP for male patient.        Risk to self Substance abuse history and/or treatment for substance abuse?: Yes        Mental Status Report Motor Activity: Unremarkable                         Advance Directives (For Healthcare) Advance Directive: Patient does not have advance directive          Disposition:     On Site Evaluation by:   Reviewed with Physician:     Angelica Ran 05/17/2011 3:37 PM  Addendum:  Writer contacted both [facility] and [facility] for possible treatment and pt was declined by both facilities for the amount that he is using.  Needs a higher level of medical care per both facilities.

## 2011-05-17 NOTE — ED Provider Notes (Signed)
Act team indicates pt accepted to [facility], bed ready, dr Corliss Parish.  Pt alert, content, no distress.   Suzi Roots, MD 05/17/11 2252

## 2011-05-17 NOTE — ED Notes (Signed)
Received report assumed patient care, walking round completed,  Patient resting.

## 2011-05-17 NOTE — ED Notes (Signed)
Denies SI or HI 

## 2011-05-17 NOTE — ED Notes (Signed)
Informed patient and/or family of status. Resting, eyes closed, easily aroused.  No voiced complaints presently. NAD. Awaiting bed assignment.

## 2011-05-17 NOTE — ED Notes (Signed)
Here for med clearance states was told by  [facility] to come here for detox for methadone

## 2011-05-17 NOTE — ED Notes (Signed)
Pt requesting detox from methadone. 3 yr history daily methadone use of 110mg  every morning. Reports has chronic back pain, was taking oxycontin & then placed on methadone instead. Pt states has tried on own to quit but unsuccessful.  Also states was informed by [facility] to go to ED to be medically cleared first & then he could go to [facility]. Denies SI, HI, A/V hallucinations. Denies alcohol, any other substance abuse.

## 2011-05-18 ENCOUNTER — Inpatient Hospital Stay (HOSPITAL_COMMUNITY)
Admission: AD | Admit: 2011-05-18 | Discharge: 2011-05-21 | DRG: 897 | Disposition: A | Discharge status: Home / Self Care | Payer: PRIVATE HEALTH INSURANCE | Source: Ambulatory Visit | Attending: Psychiatry | Admitting: Psychiatry

## 2011-05-18 ENCOUNTER — Encounter (HOSPITAL_COMMUNITY): Payer: Self-pay | Admitting: *Deleted

## 2011-05-18 DIAGNOSIS — Z880 Allergy status to penicillin: Secondary | ICD-10-CM

## 2011-05-18 DIAGNOSIS — F329 Major depressive disorder, single episode, unspecified: Secondary | ICD-10-CM

## 2011-05-18 DIAGNOSIS — F1123 Opioid dependence with withdrawal: Secondary | ICD-10-CM

## 2011-05-18 DIAGNOSIS — R51 Headache: Secondary | ICD-10-CM

## 2011-05-18 DIAGNOSIS — M48061 Spinal stenosis, lumbar region without neurogenic claudication: Secondary | ICD-10-CM

## 2011-05-18 DIAGNOSIS — K219 Gastro-esophageal reflux disease without esophagitis: Secondary | ICD-10-CM

## 2011-05-18 DIAGNOSIS — F19939 Other psychoactive substance use, unspecified with withdrawal, unspecified: Secondary | ICD-10-CM

## 2011-05-18 DIAGNOSIS — F17200 Nicotine dependence, unspecified, uncomplicated: Secondary | ICD-10-CM

## 2011-05-18 DIAGNOSIS — E079 Disorder of thyroid, unspecified: Secondary | ICD-10-CM

## 2011-05-18 DIAGNOSIS — I10 Essential (primary) hypertension: Secondary | ICD-10-CM

## 2011-05-18 DIAGNOSIS — M545 Low back pain: Secondary | ICD-10-CM

## 2011-05-18 DIAGNOSIS — F1120 Opioid dependence, uncomplicated: Principal | ICD-10-CM

## 2011-05-18 DIAGNOSIS — E876 Hypokalemia: Secondary | ICD-10-CM

## 2011-05-18 DIAGNOSIS — E7800 Pure hypercholesterolemia, unspecified: Secondary | ICD-10-CM

## 2011-05-18 DIAGNOSIS — F411 Generalized anxiety disorder: Secondary | ICD-10-CM

## 2011-05-18 MED ORDER — DICYCLOMINE HCL 20 MG PO TABS: 20.0000 mg | ORAL_TABLET | ORAL | Status: DC | PRN

## 2011-05-18 MED ORDER — ACETAMINOPHEN 325 MG PO TABS
650.0000 mg | ORAL_TABLET | Freq: Four times a day (QID) | ORAL | Status: DC | PRN
  Administered 2011-05-18 – 2011-05-20 (×9): 650 mg via ORAL

## 2011-05-18 MED ORDER — LISINOPRIL 20 MG PO TABS
20.0000 mg | ORAL_TABLET | Freq: Every day | ORAL | Status: DC
  Administered 2011-05-18 – 2011-05-21 (×5): 20 mg via ORAL
  Filled 2011-05-18 (×2): qty 1
  Filled 2011-05-18: qty 14
  Filled 2011-05-18 (×3): qty 1

## 2011-05-18 MED ORDER — NICOTINE 21 MG/24HR TD PT24
MEDICATED_PATCH | TRANSDERMAL | Status: AC
  Administered 2011-05-19: 21 mg via TRANSDERMAL
  Filled 2011-05-18: qty 1

## 2011-05-18 MED ORDER — CLONIDINE HCL 0.1 MG PO TABS
0.1000 mg | ORAL_TABLET | Freq: Every day | ORAL | Status: DC
  Filled 2011-05-18 (×2): qty 1

## 2011-05-18 MED ORDER — METHOCARBAMOL 500 MG PO TABS
500.0000 mg | ORAL_TABLET | Freq: Three times a day (TID) | ORAL | Status: DC | PRN
  Administered 2011-05-18 – 2011-05-21 (×8): 500 mg via ORAL
  Filled 2011-05-18 (×9): qty 1

## 2011-05-18 MED ORDER — CLONIDINE HCL 0.1 MG PO TABS
0.1000 mg | ORAL_TABLET | ORAL | Status: DC
  Administered 2011-05-20 – 2011-05-21 (×3): 0.1 mg via ORAL
  Filled 2011-05-18 (×4): qty 1

## 2011-05-18 MED ORDER — NICOTINE 21 MG/24HR TD PT24: 21.0000 mg | MEDICATED_PATCH | Freq: Every day | TRANSDERMAL | Status: DC

## 2011-05-18 MED ORDER — LOPERAMIDE HCL 2 MG PO CAPS: 2.0000 mg | ORAL_CAPSULE | ORAL | Status: DC | PRN

## 2011-05-18 MED ORDER — HYDROCHLOROTHIAZIDE 25 MG PO TABS
25.0000 mg | ORAL_TABLET | Freq: Every day | ORAL | Status: DC
  Administered 2011-05-18 – 2011-05-20 (×3): 25 mg via ORAL
  Filled 2011-05-18: qty 14
  Filled 2011-05-18 (×5): qty 1

## 2011-05-18 MED ORDER — CLONIDINE HCL 0.1 MG PO TABS
0.1000 mg | ORAL_TABLET | Freq: Four times a day (QID) | ORAL | Status: AC
  Administered 2011-05-18 – 2011-05-19 (×7): 0.1 mg via ORAL
  Filled 2011-05-18 (×8): qty 1

## 2011-05-18 MED ORDER — NAPROXEN 500 MG PO TABS
500.0000 mg | ORAL_TABLET | Freq: Two times a day (BID) | ORAL | Status: DC | PRN
  Administered 2011-05-18: 500 mg via ORAL
  Filled 2011-05-18: qty 1

## 2011-05-18 MED ORDER — ONDANSETRON 4 MG PO TBDP
4.0000 mg | ORAL_TABLET | Freq: Four times a day (QID) | ORAL | Status: DC | PRN
  Administered 2011-05-18: 4 mg via ORAL

## 2011-05-18 MED ORDER — MAGNESIUM HYDROXIDE 400 MG/5ML PO SUSP: 30.0000 mL | Freq: Every day | ORAL | Status: DC | PRN

## 2011-05-18 MED ORDER — ALUM & MAG HYDROXIDE-SIMETH 200-200-20 MG/5ML PO SUSP: 30.0000 mL | ORAL | Status: DC | PRN

## 2011-05-18 MED ORDER — HYDROXYZINE HCL 25 MG PO TABS
25.0000 mg | ORAL_TABLET | Freq: Four times a day (QID) | ORAL | Status: DC | PRN
  Administered 2011-05-18 – 2011-05-21 (×7): 25 mg via ORAL
  Filled 2011-05-18: qty 1

## 2011-05-18 MED ORDER — NICOTINE 21 MG/24HR TD PT24
21.0000 mg | MEDICATED_PATCH | Freq: Every day | TRANSDERMAL | Status: DC
  Administered 2011-05-19 – 2011-05-21 (×3): 21 mg via TRANSDERMAL
  Filled 2011-05-18 (×6): qty 1

## 2011-05-18 NOTE — H&P (Signed)
  Pt was seen by me today and I agree with the key elements documented in H&P.  

## 2011-05-18 NOTE — Progress Notes (Signed)
Pt isolative to room this am.  Explained that this would be a difficult 3 days and allowed pt to sleep this shift due to late early am admit.  Rates depression at 7 and hopelessness at 3. Denies SI. C/O tremors,agitation and chills r/t withdrawal.  Compliant with scheduled protocols.  Appetite is poor.  Cont to assess detox.  Support offered and 15' checks cont for safety.

## 2011-05-18 NOTE — BH Assessment (Signed)
Pt has been accepted to [facility].  Support paperwork complete and faxed to [facility].  Travis [name] is an 46 y.o. male that is requesting detox from Methadone. Pt reports daily use of 110 mg Methadone by the [facility]. Pt has begun to seek treatment for his use because "I cannot afford it and I cannot function with it." Pt reports using this for three years after attempting to quit his Opiate Dependence, including Hydrocodone and Oxycontin. Pt reports increased panic attacks x3 when attempting to quit and withdrawals, including crawling skin, nausea, shakiness, fever/chills, and decreased appetite. Pt denies any SI, HI, or any active psychosis and is able to contract for safety. Pt is in the process of applying for disability, but has not yet been accepted. Writer will refer to [facility] and [facility] for possible inpatient treatment and detox. Dr. And nursing staff notified and agreeable with pending disposition.  Axis I: Opioid Dependence and GAD  Axis II: Deferred  Axis III:  Past Medical History   Diagnosis  Date   .  Thyroid disease    .  Hypertension    .  Anxiety    .  Tachycardia - pulse     Axis IV: other psychosocial or environmental problems, problems related to social environment and problems with primary support group  Axis V: 31-40 impairment in reality testing   Past Medical History:  Past Medical History  Diagnosis Date  . Thyroid disease   . Hypertension   . Anxiety   . Tachycardia - pulse     History reviewed. No pertinent past surgical history.  Family History:  Family History  Problem Relation Age of Onset  . Stroke Mother   . Hypertension Mother   . Stroke Father   . Hypertension Father     Social History:  reports that he has been smoking.  He does not have any smokeless tobacco history on file. He reports that he does not drink alcohol or use illicit drugs.  Additional Social History:  Alcohol / Drug Use Pain Medications: yes Prescriptions: not  prescribed Over the Counter: no History of alcohol / drug use?: Yes Substance #1 Name of Substance 1: Methadone 1 - Age of First Use: 37 1 - Amount (size/oz): 110mg  1 - Frequency: QD 1 - Duration: six years + 1 - Last Use / Amount: yesterday Substance #2 Name of Substance 2: Opiates  2 - Age of First Use: 20's 2 - Amount (size/oz): Codones and Oxycontin "as much as I could get" 2 - Frequency: QD 2 - Duration: years ago 2 - Last Use / Amount: several years ago Allergies:  Allergies  Allergen Reactions  . Penicillins Hives, Itching and Other (See Comments)    Hallucinations.    Home Medications:  Medications Prior to Admission  Medication Dose Route Frequency Provider Last Rate Last Dose  . acetaminophen (TYLENOL) tablet 650 mg  650 mg Oral Q4H PRN Shaaron Adler, PA-C      . alum & mag hydroxide-simeth (MAALOX/MYLANTA) 200-200-20 MG/5ML suspension 30 mL  30 mL Oral PRN Shaaron Adler, PA-C      . ibuprofen (ADVIL,MOTRIN) tablet 600 mg  600 mg Oral Q8H PRN Shaaron Adler, PA-C      . LORazepam (ATIVAN) tablet 1 mg  1 mg Oral Q8H PRN Shaaron Adler, PA-C   1 mg at 05/17/11 1727  . ondansetron (ZOFRAN) tablet 4 mg  4 mg Oral Q8H PRN Shaaron Adler, PA-C      .  zolpidem (AMBIEN) tablet 5 mg  5 mg Oral QHS PRN Shaaron Adler, PA-C       Medications Prior to Admission  Medication Sig Dispense Refill  . acetaminophen (TYLENOL) 500 MG tablet Take 1,000 mg by mouth every 6 (six) hours as needed. For pain.      . hydrochlorothiazide (HYDRODIURIL) 25 MG tablet Take 25 mg by mouth daily.      Marland Kitchen ibuprofen (ADVIL,MOTRIN) 200 MG tablet Take 600 mg by mouth every 6 (six) hours as needed. For pain.      Marland Kitchen lisinopril (PRINIVIL,ZESTRIL) 20 MG tablet Take 20 mg by mouth daily.      . Cyanocobalamin (VITAMIN B-12 PO) Take 1 tablet by mouth daily.      . Omega-3 Fatty Acids (FISH OIL PO) Take 1 capsule by mouth daily.        OB/GYN Status:   No LMP for male patient.  General Assessment Data Location of Assessment: [facility] ED     Risk to self Substance abuse history and/or treatment for substance abuse?: Yes        Mental Status Report Motor Activity: Unremarkable                         Advance Directives (For Healthcare) Advance Directive: Patient does not have advance directive          Disposition:  Disposition Disposition of Patient: Inpatient treatment program Type of inpatient treatment program: Adult  On Site Evaluation by:   Reviewed with Physician:     Steward Ros 05/18/2011 12:48 AM

## 2011-05-18 NOTE — Progress Notes (Signed)
Patient ID: Travis [name], male   DOB: 01-29-1966, 46 y.o.   MRN: [id number]  [facility] Notes:  (Counselor/Nursing/MHT/Case Management/Adjunct)  05/18/2011 1:15 PM  Type of Therapy:  Group Therapy, Dance/Movement Therapy   Participation Level:  Did Not Attend  Kym Groom
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
## 2011-05-18 NOTE — ED Provider Notes (Signed)
Medical screening examination/treatment/procedure(s) were performed by non-physician practitioner and as supervising physician I was immediately available for consultation/collaboration.   Gavin Pound. Oletta Lamas, MD 05/18/11 [phone]

## 2011-05-18 NOTE — BHH Counselor (Addendum)
Adult Comprehensive Assessment  Patient ID: Jelan Batterton, male   DOB: 11-Mar-1965, 46 y.o.   MRN: [id number]  Information Source: Information source: Patient  Current Stressors:  Educational / Learning stressors: 11th grade education Employment / Job issues: Pt. does  not work Family Relationships: Divored for 20 yrs. Financial / Lack of resources (include bankruptcy): Disability case is pending  Housing / Lack of housing: Lives with mother Physical health (include injuries & life threatening diseases): Has back problems.  Has been out of work for 8 yrs. due to back problems Social relationships: N/A Substance abuse: Detoxing from [employer] Bereavement / Loss: N/A  Living/Environment/Situation:  Living Arrangements: Parent (mother) Living conditions (as described by patient or guardian): OK How long has patient lived in current situation?: 10 yrs What is atmosphere in current home: Supportive  Family History:  Marital status: Divorced Divorced, when?: 20 yrs What types of issues is patient dealing with in the relationship?: Difference of opinions Additional relationship information: N/A Does patient have children?: No  Childhood History:  By whom was/is the patient raised?: Both parents Additional childhood history information: N/A Description of patient's relationship with caregiver when they were a child: Good Patient's description of current relationship with people who raised him/her: Good Does patient have siblings?: Yes Number of Siblings: 7  (2 brothers and 5 sisters) Description of patient's current relationship with siblings: Good Did patient suffer any verbal/emotional/physical/sexual abuse as a child?: No Did patient suffer from severe childhood neglect?: No Has patient ever been sexually abused/assaulted/raped as an adolescent or adult?: No Was the patient ever a victim of a crime or a disaster?: No Witnessed domestic violence?: No Has patient been effected by  domestic violence as an adult?: No  Education:  Highest grade of school patient has completed: 11 th grade Currently a student?: No Learning disability?: No  Employment/Work Situation:   Employment situation: Unemployed Patient's job has been impacted by current illness: No What is the longest time patient has a held a job?: 8 yrs ago 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) Has patient ever been in the [employer]?: No Has patient ever served in Buyer, retail?: No  Financial Resources:   Surveyor, quantity resources: Sales executive Does patient have a Lawyer or guardian?: No  Alcohol/Substance Abuse:   What has been your use of drugs/alcohol within the last 12 months?:  110 millagrams  of methodone a day If attempted suicide, did drugs/alcohol play a role in this?: No Alcohol/Substance Abuse Treatment Hx: Denies past history If yes, describe treatment: N/A Has alcohol/substance abuse ever caused legal problems?: No  Social Support System:   Conservation officer, nature Support System: Good Describe Community Support System: Mother Type of faith/religion: N/A How does patient's faith help to cope with current illness?: N/A  Leisure/Recreation:   Leisure and Hobbies: Watch television  Strengths/Needs:   What things does the patient do well?: Can't think of any In what areas does patient struggle / problems for patient: Addiction  Discharge Plan:   Does patient have access to transportation?: Yes (Mother) Will patient be returning to same living situation after discharge?: Yes Currently receiving community mental health services: Yes (From Whom) ([facility]) If no, would patient like referral for services when discharged?: Yes (What county?) Medical sales representative) Does patient have financial barriers related to discharge medications?: No  Summary/Recommendations:   Summary and Recommendations (to be completed by the evaluator): Pt is a 46 yr. old male.   Recommendations for treatment include crisis stabilization, case  mgmt, medication mgmt. psycoeduction to teach coping skills and group therapy.  Rhunette Croft. 05/18/2011 Signed by Gevena Mart

## 2011-05-18 NOTE — ED Notes (Signed)
ACT team member Marchelle Folks talking with patient about transfer to [facility]

## 2011-05-18 NOTE — BHH Suicide Risk Assessment (Signed)
Suicide Risk Assessment  Admission Assessment     Demographic factors:   male 4 Current Mental Status:     Objective: Appearance: Disheveled   Psychomotor Activity: Increased   Eye Contact:: Fair   Speech: Clear and Coherent   Volume: Normal   Mood: anxious/depressed   Affect: Congruent   Thought Process: clear rational goal oriented - get detoxed so he can go to [facility]   Orientation: Full   Thought Content: No AVH/psychosis   Suicidal Thoughts: No   Homicidal Thoughts: No   Judgement: Intact   Insight: Good     Loss Factors:   family life, conflict with family due to methadone and drugs Historical Factors:   opiods Risk Reduction Factors:   family, good social support  CLINICAL FACTORS:   Alcohol/Substance Abuse/Dependencies  COGNITIVE FEATURES THAT CONTRIBUTE TO RISK:  Closed-mindedness    SUICIDE RISK:   Mild:  Suicidal ideation of limited frequency, intensity, duration, and specificity.  There are no identifiable plans, no associated intent, mild dysphoria and related symptoms, good self-control (both objective and subjective assessment), few other risk factors, and identifiable protective factors, including available and accessible social support.  PLAN OF CARE:   Admit to medically support through methadone withdrawal using the clonidine protocol.  Help get to bed at [facility].   Wonda Cerise 05/18/2011, 5:48 PM
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
## 2011-05-19 MED ORDER — LIDOCAINE 5 % EX PTCH
1.0000 | MEDICATED_PATCH | CUTANEOUS | Status: DC
  Administered 2011-05-19 – 2011-05-20 (×2): 1 via TRANSDERMAL
  Filled 2011-05-19 (×5): qty 1

## 2011-05-19 NOTE — Progress Notes (Signed)
Patient ID: Travis [name], male   DOB: [birth year], 46 y.o.   MRN: [id number] Has been in bed during the evening, states the ativan that he had been on had helped his back spasms and has had trouble getting comfortable, and trouble with his anxiety.  Has been medicated for back spasms and given ice packs as well.  Sleeping at intervals. Remains depressed , sad, flat. Will continue to monitor.

## 2011-05-19 NOTE — Progress Notes (Signed)
[facility] MD Progress Note  05/19/2011 10:27 AM  Diagnosis:   Axis I: See current hospital problem list Axis II: Deferred Axis III:  Past Medical History  Diagnosis Date  . Thyroid disease   . Hypertension   . Anxiety   . Tachycardia - pulse    Axis IV: Unchanged Axis V: 41-50 serious symptoms  ADL's:  Intact  Sleep: Poor  Appetite:  Poor  Suicidal Ideation:  None Homicidal Ideation:  None  Subjective: Pt lying in bed c/o back pain, and symptoms typical of opioid w/d.  Poor sleep, poor appetite, cramps, sweats, chills, vomiting.  Does not understand why he was taken off Ativan.  Denies SI/HI or AVH.  AEB (as evidenced by):  Mental Status Examination/Evaluation: Objective:  Appearance: Disheveled  Eye Contact::  Good  Speech:  Clear and Coherent  Volume:  Normal  Mood:  Anxious, Depressed and Irritable  Affect:  Congruent  Thought Process:  Circumstantial  Orientation:  Full  Thought Content:  WDL  Suicidal Thoughts:  No  Homicidal Thoughts:  No  Memory:  Remote;   Good  Judgement:  Impaired  Insight:  Lacking  Psychomotor Activity:  Decreased and Psychomotor Retardation  Concentration:  Good  Recall:  Good  Akathisia:  No  Handed:    AIMS (if indicated):     Assets:  Communication Skills Desire for Improvement  Sleep:  Number of Hours: 6.75    Vital Signs:Blood pressure 113/79, pulse 85, temperature 98 F (36.7 C), temperature source Oral, resp. rate 18, height 5\' 11"  (1.803 m), weight 145.151 kg (320 lb). Current Medications: Current Facility-Administered Medications  Medication Dose Route Frequency Provider Last Rate Last Dose  . acetaminophen (TYLENOL) tablet 650 mg  650 mg Oral Q6H PRN Ronny Bacon, MD   650 mg at 05/19/11 0626  . alum & mag hydroxide-simeth (MAALOX/MYLANTA) 200-200-20 MG/5ML suspension 30 mL  30 mL Oral Q4H PRN Curlene Labrum Readling, MD      . cloNIDine (CATAPRES) tablet 0.1 mg  0.1 mg Oral QID Curlene Labrum Readling, MD   0.1 mg at 05/19/11  1610   Followed by  . cloNIDine (CATAPRES) tablet 0.1 mg  0.1 mg Oral BH-qamhs Randy D Readling, MD       Followed by  . cloNIDine (CATAPRES) tablet 0.1 mg  0.1 mg Oral QAC breakfast Curlene Labrum Readling, MD      . dicyclomine (BENTYL) tablet 20 mg  20 mg Oral Q4H PRN Curlene Labrum Readling, MD      . hydrochlorothiazide (HYDRODIURIL) tablet 25 mg  25 mg Oral Q breakfast Curlene Labrum Readling, MD   25 mg at 05/19/11 9604  . hydrOXYzine (ATARAX/VISTARIL) tablet 25 mg  25 mg Oral Q6H PRN Ronny Bacon, MD   25 mg at 05/18/11 2100  . lisinopril (PRINIVIL,ZESTRIL) tablet 20 mg  20 mg Oral Q breakfast Curlene Labrum Readling, MD   20 mg at 05/19/11 0811  . loperamide (IMODIUM) capsule 2-4 mg  2-4 mg Oral PRN Curlene Labrum Readling, MD      . magnesium hydroxide (MILK OF MAGNESIA) suspension 30 mL  30 mL Oral Daily PRN Curlene Labrum Readling, MD      . methocarbamol (ROBAXIN) tablet 500 mg  500 mg Oral Q8H PRN Curlene Labrum Readling, MD   500 mg at 05/19/11 5409  . naproxen (NAPROSYN) tablet 500 mg  500 mg Oral BID PRN Ronny Bacon, MD   500 mg at 05/18/11 2100  . nicotine (NICODERM  CQ - dosed in mg/24 hours) patch 21 mg  21 mg Transdermal Daily Alyson Kuroski-Mazzei, DO   21 mg at 05/19/11 0817  . ondansetron (ZOFRAN-ODT) disintegrating tablet 4 mg  4 mg Oral Q6H PRN Ronny Bacon, MD   4 mg at 05/18/11 0749  . DISCONTD: nicotine (NICODERM CQ - dosed in mg/24 hours) patch 21 mg  21 mg Transdermal Daily Mickie D. Adams, PA        Lab Results: No results found for this or any previous visit (from the past 48 hour(s)).  Physical Findings: AIMS:  , ,  ,  ,    CIWA:    COWS:  COWS Total Score: 8   Treatment Plan Summary: Daily contact with patient to assess and evaluate symptoms and progress in treatment Medication management  Plan: Continue medical detox.  Consider follow-up referrals.  Mardi Cannady 05/19/2011, 10:27 AM

## 2011-05-19 NOTE — Progress Notes (Signed)
Pt is experiencing severe opiate withdrawal with symptoms of tremor,fatigue,irritability,chills and anxiety.  Compliant with scheduled detox protocol.  Pt states that prn pain medications are ineffective.  Support offered as well as heat packs.  Suggested warm bath.  Fluids encouraged.  Cont to ensure physical safety and cont 15' checks for safety.

## 2011-05-19 NOTE — Progress Notes (Signed)
Patient ID: Travis [name], male   DOB: 1965/10/01, 46 y.o.   MRN: [id number]  [facility] Group Notes:  (Counselor/Nursing/MHT/Case Management/Adjunct)  05/19/2011 1:15 PM  Type of Therapy:  Group Therapy, Dance/Movement Therapy   Participation Level:  Did Not Attend   Rhunette Croft

## 2011-05-19 NOTE — Progress Notes (Signed)
[facility] Adult Inpatient Family/Significant Other Suicide Prevention Education  Suicide Prevention Education:  Contact Attempts: Lucendia Herrlich [phone]- has been identified by the patient as the family member/significant other with whom the patient will be residing, and identified as the person(s) who will aid the patient in the event of a mental health crisis.  With written consent from the patient, two attempts were made to provide suicide prevention education, prior to and/or following the patient's discharge.  We were unsuccessful in providing suicide prevention education.  A suicide education pamphlet was given to the patient to share with family/significant other.  Date and time of first attempt: by Lamar Blinks on 05/19/11 at 5:14 p.m. Date and time of second attempt  Neila Gear 05/19/2011, 5:24 PM

## 2011-05-20 DIAGNOSIS — F1120 Opioid dependence, uncomplicated: Principal | ICD-10-CM

## 2011-05-20 MED ORDER — CHLORDIAZEPOXIDE HCL 25 MG PO CAPS
25.0000 mg | ORAL_CAPSULE | Freq: Four times a day (QID) | ORAL | Status: AC
  Administered 2011-05-20 (×3): 25 mg via ORAL
  Filled 2011-05-20 (×2): qty 1

## 2011-05-20 MED ORDER — GABAPENTIN 400 MG PO CAPS
400.0000 mg | ORAL_CAPSULE | Freq: Three times a day (TID) | ORAL | Status: DC
  Administered 2011-05-20 – 2011-05-21 (×3): 400 mg via ORAL
  Filled 2011-05-20 (×2): qty 1
  Filled 2011-05-20 (×3): qty 42
  Filled 2011-05-20 (×3): qty 1

## 2011-05-20 MED ORDER — CHLORDIAZEPOXIDE HCL 25 MG PO CAPS: 25.0000 mg | ORAL_CAPSULE | Freq: Once | ORAL | Status: DC

## 2011-05-20 MED ORDER — CHLORDIAZEPOXIDE HCL 25 MG PO CAPS
25.0000 mg | ORAL_CAPSULE | Freq: Three times a day (TID) | ORAL | Status: DC
  Administered 2011-05-21 (×2): 25 mg via ORAL
  Filled 2011-05-20 (×3): qty 1

## 2011-05-20 MED ORDER — CHLORDIAZEPOXIDE HCL 25 MG PO CAPS: 25.0000 mg | ORAL_CAPSULE | ORAL | Status: DC

## 2011-05-20 MED ORDER — CHLORDIAZEPOXIDE HCL 25 MG PO CAPS: 25.0000 mg | ORAL_CAPSULE | Freq: Every day | ORAL | Status: DC

## 2011-05-20 NOTE — Progress Notes (Signed)
[facility] Group Notes:  (Counselor/Nursing/MHT/Case Management/Adjunct)  05/20/2011 2:19 PM  Type of Therapy:  Group Therapy at 11:00 AM and  1:15PM  Participation Level:  Did Not Attend    Clide Dales 05/20/2011, 2:19 PM

## 2011-05-20 NOTE — Progress Notes (Signed)
[facility] MD Progress Note  05/20/2011 1:41 PM  S/O: Pt in pain, discussed non addictive alternatives for management.  Still in sig opioid and benzo w/d.  Patient seen and evaluated. Chart and labs reviewed. Patient stated that his mood was "not good". His affect was mood congruent and anxious re pain. He denied any current thoughts of self injurious behavior, suicidal ideation or homicidal ideation. There were no auditory or visual hallucinations, paranoia, delusional thought processes, or mania noted.  Thought process was linear and goal directed.  His speech was normal rate, tone and volume. Eye contact was good. Judgment and insight are fair.  Patient has been up and engaged on the unit.  No acute safety concerns reported from team.  Sleep:  Number of Hours: 6.75    Vital Signs:Blood pressure 152/104, pulse 81, temperature 97 F (36.1 C), temperature source Oral, resp. rate 18, height 5\' 11"  (1.803 m), weight 145.151 kg (320 lb).  Lab Results: No results found for this or any previous visit (from the past 48 hour(s)).  Physical Findings: CIWA:  CIWA-Ar Total: 4  COWS:  COWS Total Score: 11   A/P:  Opioid Dependence & W/D; Benzodiazepine W/D; Chronic Pain   -Initiated librium taper for w/d off Ativan.  Will consult cardiology, will obtain collateral re potential cardiac Hx. WPW.  Tx options pending consultation. - Neurontin initiated for pain control.  Medication education completed.  Pros, cons, risks, potential side effects and benefits (including no treatment) were discussed with pt.  Pt agreeable with the plan.  See orders.  Discussed with team. -Pt requesting d/s tomorrow, will see in team.  Current issues with family and living situation.  Lupe Carney 05/20/2011, 1:41 PM

## 2011-05-20 NOTE — Progress Notes (Signed)
Counselor spoke with patient's mother, Ms Kapena Hamme, at [phone] who reports patient needs to complete a treatment program after completing detox before returning to her home.   Mother was also concerned about repair at her apartment, requested counselor ask patient to contact her. Counselor gave patient message.  Clide Dales 05/20/2011.2:15 PM

## 2011-05-20 NOTE — Progress Notes (Addendum)
Patient ID: Travis [name], male   DOB: 05-03-1965, 46 y.o.   MRN: [id number] Has spent most of the day and evening in bed, states is having a great deal of pain and spasms in his back, usually rates it at 10 nearly always, and at best around 7-8.Has been pleasant, but minimal participation, didn't attend group, and states if tomorrow isn't better, may have to sign himself out. Has received several prn meds for pain and anxiety, lido patch was started tonight. Has been appropriate and appreciative,but remains uncomfortable. Will continue to monitor.

## 2011-05-20 NOTE — Discharge Planning (Signed)
Farid attended AM group, good participation.  Here for "methadone taper."  Then went on to explain that his last dose of methadone from [facility] was last Wed., and he is not on a taper.  Requested to d/c while also c/o significant pain.  Assured him he would see Dr today to find out about possible pain management trials here, and he could then make a decision about whether it makes sense to leave or stay.  Agreeable to this.

## 2011-05-20 NOTE — H&P (Signed)
Cosigned for Dr. Rasul. 

## 2011-05-20 NOTE — Progress Notes (Signed)
Pt was in bed upon first assessment.  He rated his depression a 1 and hopelessness a 3 and his anxiety a 10 on his self-inventory.  He signed his 72 hour request for discharge at 1100 today doctor aware.  He is worried about his 44 year old mother that he usually takes care of but his brother is looking after her while he is in the hospital.  He is also wanting his pain medications back he stated,"I didn't realize how much the medications help my pain until I got off them".  He was placed on the librium protocol to cover the ativan that he was taking at home and started on neurontin to help with his pain and anxiety.  Pt has had several prn medications today mainly for his pain he denies any symptoms of withdrawal thus far.  He feels all his anxiety and pain is related to his back problems of his L4-L5 disks.  See mar and pain for his meds given.

## 2011-05-20 NOTE — Progress Notes (Signed)
[facility] Adult Inpatient Family/Significant Other Suicide Prevention Education  Suicide Prevention Education:  Education Completed;Kahlil Cowans, pt's mother, at [phone]  has been identified by the patient as the family member/significant other with whom the patient will be residing, and identified as the person(s) who will aid the patient in the event of a mental health crisis (suicidal ideations/suicide attempt).  With written consent from the patient, the family member/significant other has been provided the following suicide prevention education, prior to the and/or following the discharge of the patient.  The suicide prevention education provided includes the following:  Suicide risk factors  Suicide prevention and interventions  National Suicide Hotline telephone number  [facility] assessment telephone number  [facility] Emergency Assistance 911  [facility] and/or Residential Mobile Crisis Unit telephone number  Request made of family/significant other to:  Remove weapons (e.g., guns, rifles, knives), all items previously/currently identified as safety concern.    Remove drugs/medications (over-the-counter, prescriptions, illicit drugs), all items previously/currently identified as a safety concern.  The family member/significant other verbalizes understanding of the suicide prevention education information provided.  The family member/significant other agrees to remove the items of safety concern listed above.  Clide Dales 05/20/2011, 1:03 PM

## 2011-05-21 MED ORDER — GABAPENTIN 400 MG PO CAPS: 400.0000 mg | ORAL_CAPSULE | Freq: Three times a day (TID) | ORAL | Status: DC

## 2011-05-21 NOTE — Progress Notes (Signed)
[facility] Group Notes:  (Counselor/Nursing/MHT/Case Management/Adjunct)  05/21/2011 12:53 PM  Type of Therapy:  Group Therapy  Participation Level:  Active  Participation Quality:  Appropriate  Affect:  Appropriate  Cognitive:  Alert and Oriented  Insight:  Limited  Engagement in Group:  Good  Engagement in Therapy:  Limited  Modes of Intervention:  Clarification, Education and Support  Summary of Progress/Problems:  This was patient's first counseling group attendance that writer is aware of for stay. Patient contributed at several points and shared "difficulty dealing with perceptions and judgements form others, especially family members."  Patient shared one of the biggest obstacles for him in goal of recovery will be "physical pain, and then there is the emotional pain."   Chanetta Marshall asked if what he heard was true 'that people with addiction cannot experience joy like normal people.'   Clide Dales 05/21/2011, 1:02 PM

## 2011-05-21 NOTE — Treatment Plan (Signed)
Interdisciplinary Treatment Plan Update (Adult)  Date: 05/21/2011  Time Reviewed: 8:22 AM   Progress in Treatment: Attending groups: Yes Participating in groups: Yes Taking medication as prescribed: Yes Tolerating medication: Yes   Family/Significant other contact made: Yes   Patient understands diagnosis:  Yes  As evidenced by asking for help with getting off methadone, pain management Discussing patient identified problems/goals with staff:  Yes Medical problems stabilized or resolved:  Yes Denies suicidal/homicidal ideation: Yes  On self inventory and in tx team Issues/concerns per patient self-inventory:  Yes  Anxiety 5, but this is down from 10 yesterday  Rates pain at a 9  Concerned about affording meds as he has no medicaid Other:  New problem(s) identified: N/A  Reason for Continuation of Hospitalization: Other; describe D/C today  Interventions implemented related to continuation of hospitalization:   Additional comments:  Estimated length of stay:D/C today  Discharge Plan: Return home  Follow up [facility], [facility]  New goal(s): N/A  Review of initial/current patient goals per problem list:   1.  Goal(s):Get d/ced ASAP  Met:  Yes  Target date:3/19  As evidenced by:D/C order  2.  Goal (s): safely detox from benzos and opiates  Met:  Yes  Target date:3/21  As evidenced ZO:XWRUE states he is no longer experiencing "cravings", and plans to use benzos at d/c  3.  Goal(s):  Met:  Yes  Target date:  As evidenced by:  4.  Goal(s):  Met:  Yes  Target date:  As evidenced by:  Attendees: Patient:  Travis [name] 05/21/2011 8:22 AM  Family:     Physician:  Lupe Carney 05/21/2011 8:22 AM   Nursing:  Roswell Miners  05/21/2011 8:22 AM   Case Manager:  Richelle Ito, LCSW 05/21/2011 8:22 AM   Counselor:  Ronda Fairly, LCSWA 05/21/2011 8:22 AM   Other: Verne Spurr  05/21/2011 8:22 AM  Other:     Other:     Other:      Scribe for Treatment  Team:   Ida Rogue, 05/21/2011 8:22 AM

## 2011-05-21 NOTE — Progress Notes (Signed)
[facility] Case Management Discharge Plan:  Will you be returning to the same living situation after discharge: Yes,  home At discharge, do you have transportation home?:Yes,  family Do you have the ability to pay for your medications:Yes,  from [facility] and [employer] Information:     Release of information consent forms completed and in the chart;  [name].  [name]:  Follow-up Information    Follow up with [facility].      Follow up with [facility] on 05/21/2011.   Contact information:   [name]  [phone]         Patient denies SI/HI:   Yes,  yes    Safety Planning and Suicide Prevention discussed:  Yes,  yes  Barrier to discharge identified:No.  Summary and Recommendations:   Travis [name] 05/21/2011, 10:11 AM

## 2011-05-21 NOTE — Discharge Summary (Signed)
Physician Discharge Summary Note  Patient:  Travis [name] is an 46 y.o., male MRN:  [id number] DOB:  [birth year] Patient phone:  [phone] (home)  Patient address:   [address] [name] [address],   Date of Admission:  05/18/2011 Date of Discharge: 05/21/2011  Reason for Admission:Detox  Discharge Diagnoses: Principal Problem:  *Methadone withdrawal   Axis Diagnosis:  Discharge Diagnoses:  AXIS I: Opioid Dependence; Benzodiazepine Dependence; Chronic Pain; r/o Anxiety Disorder NOS  AXIS II: Deferred  AXIS III:  Past Medical History   Diagnosis  Date   .  Thyroid disease    .  Hypertension    .  Anxiety    .  Tachycardia - pulse    AXIS IV: moderate  AXIS V: 50  Level of Care:  OP  Hospital Course:  [place] was admitted for detox from Methadone.  He was placed on a standard Clonidine protocol and supportive medication for symptoms.  He was also placed on a Librium protocol for the increased use of benzodiazepines as well.  He participated in group and unit programming including AA and NA.  He signed a 72 hour request for discharge shortly after arriving stating that he needed to get home to take care of his elderly mother.  He met with the treatment team and requested discharge, even though he was not through his detox protocol.  He denied SI/HI and showed limited insight into his illness.  He was felt safe to be discharged home as he did not meet the requirements for involuntary commitment in [place].  He was informed that medically he would benefit from completion of his detox, but was not interested and wanted to leave.  Consults:  None  Significant Diagnostic Studies:  None  Discharge Vitals:   Blood pressure 132/92, pulse 83, temperature 97.5 F (36.4 C), temperature source Oral, resp. rate 20, height 5\' 11"  (1.803 m), weight 145.151 kg (320 lb).  Mental Status Exam: See Mental Status Examination and Suicide Risk Assessment completed by Attending  Physician prior to discharge.  Discharge destination:  Home  Is patient on multiple antipsychotic therapies at discharge:  No   Has Patient had three or more failed trials of antipsychotic monotherapy by history:  No  Recommended Plan for Multiple Antipsychotic Therapies: none  Discharge Orders    Future Orders Please Complete By Expires   Diet - low sodium heart healthy      Increase activity slowly      Discharge instructions      Comments:   Take all medication as prescribed.     Medication List  As of 05/21/2011 10:20 AM   STOP taking these medications         acetaminophen 500 MG tablet      ibuprofen 200 MG tablet      LORazepam 1 MG tablet      methadone 10 MG/ML solution         TAKE these medications      Indication    gabapentin 400 MG capsule   Commonly known as: NEURONTIN   Take 1 capsule (400 mg total) by mouth 3 (three) times daily.    Indication: Neuropathic Pain      hydrochlorothiazide 25 MG tablet   Commonly known as: HYDRODIURIL   Take 25 mg by mouth daily.       lisinopril 20 MG tablet   Commonly known as: PRINIVIL,ZESTRIL   Take 20 mg by mouth daily.  VITAMIN B-12 PO   Take 1 tablet by mouth daily.          ASK your doctor about these medications      Indication    FISH OIL PO   Take 1 capsule by mouth daily.            Follow-up Information    Follow up with [facility].      Follow up with [facility] on 05/21/2011.   Contact information:   [address]  [phone]         Follow-up recommendations:  Travis:  It was recommended that [facility] stay in detox but he declined.  He was educated that he could return to the ED if he changed his mind.  Comments:  Mandy is at increased risk for relapse due to limited insight.  Signed: Rona Ravens. Ernestyne Caldwell PAC For Dr. Lupe Carney 05/21/2011, 10:20 AM

## 2011-05-21 NOTE — Progress Notes (Signed)
Pt was discharged home today.  He denied any S/I H/I or A/V hallucinations.    He was given f/u appointment, rx, sample medications, and hotline info booklet.  He voiced understanding to all instructions provided.  He declined the need for smoking cessation materials.  

## 2011-05-21 NOTE — Progress Notes (Signed)
Patient ID: Travis [name], male   DOB: [birth year], 46 y.o.   MRN: [id number]  Patient was pleasant and cooperative during the assessment. Pt informed writer to keep the lidoderm patch, because "It doesn't work anyway".  Writer suggested to pt that the patch along with other meds and position changes (bed adj) may alleviate some of the pain. Pt accepted the suggestion and writer went to the room and showed pt how to adj his bed. Support and encouragement was offered.

## 2011-05-21 NOTE — [facility] Suicide Risk Assessment (Addendum)
Suicide Risk Assessment  Discharge Assessment      Demographic factors: male, age 46  Current Mental Status Per Physician: Patient seen and evaluated in treatment team. Chart reviewed. Patient stated that his mood was "fine".  Denied w/d s/s and requested discharge. His affect was mood congruent and euthymic. He denied any current thoughts of self injurious behavior, suicidal ideation or homicidal ideation. He denied any significant depressive signs or symptoms at this time.  C/o pain and in need of further pain management with non addictive medication options.  Discussed combination of Cymbalta and Neurontin.  Pt not willing to stay for further Tx. There were no auditory or visual hallucinations, paranoia, delusional thought processes, or mania noted.  Thought process was linear and goal directed.  No psychomotor agitation or retardation was noted. His speech was normal rate, tone and volume. Eye contact was good. Judgment and insight are limited.  Patient has been up and engaged on the unit.  No acute safety concerns reported from team.  Slept better with use of neurontin.  Denied any current cravings or w/d s/s.    Loss Factors: family life, conflict with family due to methadone use and drugs; mother supported residential Tx which pt declined  Historical Factors: Opioid and Benzodiazepine Dependence  Risk Reduction Factors:  family, good social support; willing to f/u with [facility] and [facility]; applying for Medicaid  Discharge Diagnoses: AXIS I: Opioid Dependence; Benzodiazepine Dependence; Chronic Pain; r/o Anxiety Disorder NOS  AXIS II:  Deferred AXIS III:   Past Medical History  Diagnosis Date  . Thyroid disease   . Hypertension   . Anxiety   . Tachycardia - pulse   AXIS IV:  moderate AXIS V: 50  Cognitive Features That Contribute To Risk: limited insight.   Suicide Risk: Patient is currently viewed as a low risk of harm to himself and others in light of his history and risk  factors. There are no acute safety concerns and he is stable for discharge. His continued sobriety, medication management and followup will mitigate against any potential risk in the future. He is agreeable with the plan.  Discussed with the team.  Plan Of Care/Follow-up Recommendations: Pt seen and evaluated in treatment team. Chart reviewed.  Pt requesting discharge. Pt contracting for safety and does not currently meet [place] involuntary commitment criteria for continued hospitalization against his will.  Mental health treatment, medication management and continued sobriety will mitigate against the potential increased risk of harm to self and/or others in the future.  Discussed the importance of recovery further with pt, as well as, tools to move forward in a healthy & safe manner.  Pt agreeable with the plan.  Discussed with the team.  Please see orders, follow up appointments per AVS and full discharge summary to be completed by physician extender.  Recommend follow up with AA/NA.  Diet: Low sodium.  Activity: As tolerated.     Kuroski-Mazzei, Purva Vessell 05/21/2011, 10:30 AM

## 2011-05-24 NOTE — Progress Notes (Signed)
Patient Discharge Instructions:  Psychiatric Admission Assessment Note Provided,  05/24/2011 After Visit Summary (AVS) Provided,  05/24/2011 Face Sheet Provided, 05/24/2011 Faxed/Sent to the Next Level Care provider:  05/24/2011 Sent Suicide Risk Assessment - Discharge Assessment 05/24/2011  Faxed to [facility] @ [phone] And to [facility] @ [phone]  Wandra Scot, 05/24/2011, 6:01 PM

## 2011-06-06 ENCOUNTER — Emergency Department (HOSPITAL_COMMUNITY): Payer: Self-pay

## 2011-06-06 ENCOUNTER — Encounter (HOSPITAL_COMMUNITY): Payer: Self-pay | Admitting: Emergency Medicine

## 2011-06-06 ENCOUNTER — Emergency Department (HOSPITAL_COMMUNITY)
Admission: EM | Admit: 2011-06-06 | Discharge: 2011-06-06 | Disposition: A | Discharge status: Home / Self Care | Payer: Self-pay | Attending: Emergency Medicine | Admitting: Emergency Medicine

## 2011-06-06 DIAGNOSIS — Z79899 Other long term (current) drug therapy: Secondary | ICD-10-CM | POA: Insufficient documentation

## 2011-06-06 DIAGNOSIS — R141 Gas pain: Secondary | ICD-10-CM | POA: Insufficient documentation

## 2011-06-06 DIAGNOSIS — R142 Eructation: Secondary | ICD-10-CM | POA: Insufficient documentation

## 2011-06-06 DIAGNOSIS — I10 Essential (primary) hypertension: Secondary | ICD-10-CM | POA: Insufficient documentation

## 2011-06-06 DIAGNOSIS — R109 Unspecified abdominal pain: Secondary | ICD-10-CM | POA: Insufficient documentation

## 2011-06-06 LAB — DIFFERENTIAL
Basophils Absolute: 0 10*3/uL (ref 0.0–0.1)
Basophils Relative: 0 % (ref 0–1)
Eosinophils Relative: 3 % (ref 0–5)
Lymphocytes Relative: 36 % (ref 12–46)
Neutro Abs: 4 10*3/uL (ref 1.7–7.7)

## 2011-06-06 LAB — CBC
MCHC: 32.3 g/dL (ref 30.0–36.0)
Platelets: 157 10*3/uL (ref 150–400)
RDW: 14.1 % (ref 11.5–15.5)
WBC: 7.2 10*3/uL (ref 4.0–10.5)

## 2011-06-06 LAB — COMPREHENSIVE METABOLIC PANEL
ALT: 19 U/L (ref 0–53)
AST: 23 U/L (ref 0–37)
Albumin: 3.8 g/dL (ref 3.5–5.2)
CO2: 28 mEq/L (ref 19–32)
Calcium: 9.3 mg/dL (ref 8.4–10.5)
Chloride: 101 mEq/L (ref 96–112)
Creatinine, Ser: 0.87 mg/dL (ref 0.50–1.35)
GFR calc non Af Amer: >90 mL/min (ref >90)
Sodium: 137 mEq/L (ref 135–145)

## 2011-06-06 MED ORDER — ONDANSETRON HCL 4 MG/2ML IJ SOLN
4.0000 mg | Freq: Once | INTRAMUSCULAR | Status: AC
  Administered 2011-06-06: 4 mg via INTRAVENOUS
  Filled 2011-06-06: qty 2

## 2011-06-06 MED ORDER — MORPHINE SULFATE 4 MG/ML IJ SOLN
6.0000 mg | Freq: Once | INTRAMUSCULAR | Status: AC
  Administered 2011-06-06: 6 mg via INTRAVENOUS
  Filled 2011-06-06: qty 2

## 2011-06-06 MED ORDER — IOHEXOL 300 MG/ML  SOLN
125.0000 mL | Freq: Once | INTRAMUSCULAR | Status: AC | PRN
  Administered 2011-06-06: 125 mL via INTRAVENOUS

## 2011-06-06 MED ORDER — HYDROCODONE-ACETAMINOPHEN 5-325 MG PO TABS: 1.0000 | ORAL_TABLET | Freq: Four times a day (QID) | ORAL | Status: DC | PRN

## 2011-06-06 NOTE — ED Provider Notes (Signed)
Medical screening examination/treatment/procedure(s) were performed by non-physician practitioner and as supervising physician I was immediately available for consultation/collaboration.   Gavin Pound. [address]ne [address], MD 06/06/11 1055

## 2011-06-06 NOTE — ED Provider Notes (Signed)
History     CSN: [id number]  Arrival date & time 06/06/11  0407   First MD Initiated Contact with Patient 06/06/11 [phone]      Chief Complaint  Patient presents with  . Abdominal Pain    (Consider location/radiation/quality/duration/timing/severity/associated sxs/prior treatment) HPI Comments: The patient reports he has had 10/10 sharp constant abdominal pain and swelling x 3 days.  States he is scared to eat because he doesn't want to make it worse, is not taking his medications (except HCTZ) for same reason.  He took milk of magnesia to try to "clear it all out" and it caused diarrhea but did not help the pain.  States he did not feel constipated at the time. Denies fevers, N/V, hematochezia, melena, urinary symptoms.  Has hx ulcers, states this is much worse than any pain he had from ulcers.   Patient is a 46 y.o. male presenting with abdominal pain. The history is provided by the patient.  Abdominal Pain The primary symptoms of the illness include abdominal pain. The primary symptoms of the illness do not include fever or dysuria.  Symptoms associated with the illness do not include urgency or frequency.    Past Medical History  Diagnosis Date  . Thyroid disease   . Hypertension   . Anxiety   . Tachycardia - pulse     History reviewed. No pertinent past surgical history.  Family History  Problem Relation Age of Onset  . Stroke Mother   . Hypertension Mother   . Stroke Father   . Hypertension Father     History  Substance Use Topics  . Smoking status: Current Everyday Smoker -- 1.0 packs/day    Types: Cigarettes  . Smokeless tobacco: Not on file  . Alcohol Use: No      Review of Systems  Constitutional: Negative for fever.  Gastrointestinal: Positive for abdominal pain. Negative for blood in stool and anal bleeding.  Genitourinary: Negative for dysuria, urgency and frequency.  All other systems reviewed and are negative.    Allergies  Penicillins  Home  Medications   Current Outpatient Rx  Name Route Sig Dispense Refill  . VITAMIN B-12 PO Oral Take 1 tablet by mouth daily.    Marland Kitchen GABAPENTIN 400 MG PO CAPS Oral Take 1 capsule (400 mg total) by mouth 3 (three) times daily. 90 capsule 0  . HYDROCHLOROTHIAZIDE 25 MG PO TABS Oral Take 25 mg by mouth daily.    Marland Kitchen LISINOPRIL 20 MG PO TABS Oral Take 20 mg by mouth daily.    Marland Kitchen LORAZEPAM 1 MG PO TABS Oral Take 1 mg by mouth 3 (three) times daily. Panic attacks      BP 136/84  Pulse 78  Temp(Src) 98.1 F (36.7 C) (Oral)  Resp 20  SpO2 99%  Physical Exam  Nursing note and vitals reviewed. Constitutional: He is oriented to person, place, and time. He appears well-developed and well-nourished. No distress.  HENT:  Head: Normocephalic and atraumatic.  Neck: Neck supple.  Cardiovascular: Normal rate, regular rhythm and normal heart sounds.   Pulmonary/Chest: Breath sounds normal. No respiratory distress. He has no wheezes. He has no rales. He exhibits no tenderness.  Abdominal: Soft. Bowel sounds are normal. He exhibits no distension and no mass. There is tenderness in the left upper quadrant and left lower quadrant. There is no rigidity, no rebound and no guarding.  Neurological: He is alert and oriented to person, place, and time. He exhibits normal muscle tone.  Psychiatric: He  has a normal mood and affect. His behavior is normal. Judgment and thought content normal.    ED Course  Procedures (including critical care time)   Labs Reviewed  CBC  DIFFERENTIAL  COMPREHENSIVE METABOLIC PANEL  LIPASE, BLOOD   Ct Abdomen Pelvis W Contrast  06/06/2011  *RADIOLOGY REPORT*  Clinical Data: Left abdominal pain  CT ABDOMEN AND PELVIS WITH CONTRAST  Technique:  Multidetector CT imaging of the abdomen and pelvis was performed following the standard protocol during bolus administration of intravenous contrast.  Contrast:  125 ml Omnipaque-300  Comparison: 06/06/2011  Findings: Minimal dependent subpleural  atelectasis.  Normal heart size.  No pericardial or pleural effusion.  No hiatal hernia.  Abdomen:  Liver, gallbladder, slightly prominent biliary system, pancreas, spleen, adrenal glands, and left kidney are within normal limits and demonstrate no acute finding.  Incidental hypodense 4.7- cm right renal cyst in the mid to lower pole.  No obstructing urinary tract, mass or hydronephrosis. Also, there is a small 12 mm parapelvic cyst in the right kidney lower pole, image 19 on the delayed series.  Negative for free fluid, fluid collection, hemorrhage, hematoma, or adenopathy.  No bowel obstruction, dilatation, ileus or free air.  Normal appendix.  Pelvis:  No pelvic free fluid, fluid collection, hemorrhage, hematoma, distal bowel acute process, inguinal abnormality, or hernia.  Urinary bladder unremarkable.  Mild degenerative changes of the spine.  No acute osseous abnormality.  IMPRESSION: Nonspecific mild biliary prominence.  Right renal cysts  No acute intra-abdominal or pelvic process  Original Report Authenticated By: Judie Petit. Ruel Favors, M.D.   Dg Abd Acute W/chest  06/06/2011  *RADIOLOGY REPORT*  Clinical Data: Left lower quadrant pain for 3 days, history hypertension  ACUTE ABDOMEN SERIES (ABDOMEN 2 VIEW & CHEST 1 VIEW)  Comparison: Chest radiograph 01/13/2008  Findings: Upper-normal size of cardiac silhouette. Mediastinal contours and pulmonary vascularity normal. Lungs clear. No pleural effusion or pneumothorax. Diffuse osseous demineralization. Contrast within small bowel loops. Nonobstructive bowel gas pattern. No bowel dilatation, bowel wall thickening or free intraperitoneal air. No definite urinary tract calcification.  IMPRESSION: No acute abnormalities.  Original Report Authenticated By: Lollie Marrow, M.D.    7:31 AM Patient seen and examined, labs, imaging, medications ordered.  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      1. Abdominal pain       MDM    Afebrile patient with 3 days of abdominal pain, no associated symptoms.  Workup unremarkable.  Pt d/c home with pain medication, PCP follow up, to return for worsening condition.  Patient verbalizes understanding and agrees with plan.          Travis [name], [place] 06/06/11 1046

## 2011-06-06 NOTE — ED Notes (Signed)
Brought in by EMS from home with c/o abdominal pain. Pt reports that he has been having abdominal; pain for 2 days now, states he had a good BM yesterday, states "I have chronic back pain but I never had this pain before", abdominal pain most severe on epigastric area. Denies nausea, vomiting or diarrhea.

## 2011-06-06 NOTE — ED Notes (Signed)
[id number]<AV> Expected date:06/06/11<BR> Expected time: 3:49 AM<BR> Means of arrival:Ambulance<BR> Comments:<BR> abd pain

## 2011-06-06 NOTE — Discharge Instructions (Signed)
Please read the information below.  Follow up with [facility] as planned, or sooner if possible.  You may return to the ER at any time for worsening condition or any new symptoms that concern you.    Abdominal Pain Abdominal pain can be caused by many things. Your caregiver decides the seriousness of your pain by an examination and possibly blood tests and X-rays. Many cases can be observed and treated at home. Most abdominal pain is not caused by a disease and will probably improve without treatment. However, in many cases, more time must pass before a clear cause of the pain can be found. Before that point, it may not be known if you need more testing, or if hospitalization or surgery is needed. HOME CARE INSTRUCTIONS   Do not take laxatives unless directed by your caregiver.   Take pain medicine only as directed by your caregiver.   Only take over-the-counter or prescription medicines for pain, discomfort, or fever as directed by your caregiver.   Try a clear liquid diet (broth, tea, or water) for as long as directed by your caregiver. Slowly move to a bland diet as tolerated.  SEEK IMMEDIATE MEDICAL CARE IF:   The pain does not go away.   You have a fever.   You keep throwing up (vomiting).   The pain is felt only in portions of the abdomen. Pain in the right side could possibly be appendicitis. In an adult, pain in the left lower portion of the abdomen could be colitis or diverticulitis.   You pass bloody or black tarry stools.  MAKE SURE YOU:   Understand these instructions.   Will watch your condition.   Will get help right away if you are not doing well or get worse.  Document Released: 11/28/2004 Document Revised: 02/07/2011 Document Reviewed: 10/07/2007 [facility] Patient Information 2012 [place], [place].

## 2011-06-06 NOTE — ED Notes (Signed)
Patient transported to CT 

## 2011-06-06 NOTE — ED Notes (Signed)
Patient brought in from home by EMS with c/o peri-umbilical ABD pain. Denies N/V/D/fever/chills. Hx of stomach ulcers and was recently discharged from a methadone clinic.

## 2011-06-14 ENCOUNTER — Encounter (HOSPITAL_COMMUNITY): Payer: Self-pay | Admitting: Emergency Medicine

## 2011-06-14 ENCOUNTER — Emergency Department (HOSPITAL_COMMUNITY)
Admission: EM | Admit: 2011-06-14 | Discharge: 2011-06-14 | Disposition: A | Discharge status: Home / Self Care | Payer: Self-pay | Attending: Emergency Medicine | Admitting: Emergency Medicine

## 2011-06-14 DIAGNOSIS — E079 Disorder of thyroid, unspecified: Secondary | ICD-10-CM | POA: Insufficient documentation

## 2011-06-14 DIAGNOSIS — I10 Essential (primary) hypertension: Secondary | ICD-10-CM | POA: Insufficient documentation

## 2011-06-14 DIAGNOSIS — Z79899 Other long term (current) drug therapy: Secondary | ICD-10-CM | POA: Insufficient documentation

## 2011-06-14 DIAGNOSIS — F17200 Nicotine dependence, unspecified, uncomplicated: Secondary | ICD-10-CM | POA: Insufficient documentation

## 2011-06-14 MED ORDER — CLONIDINE HCL 0.1 MG PO TABS: 0.1000 mg | ORAL_TABLET | Freq: Two times a day (BID) | ORAL | Status: DC

## 2011-06-14 NOTE — ED Provider Notes (Signed)
History     CSN: [id number]  Arrival date & time 06/14/11  1336   First MD Initiated Contact with Patient 06/14/11 1528      Chief Complaint  Patient presents with  . Neck Pain  . Hypertension    (Consider location/radiation/quality/duration/timing/severity/associated sxs/prior treatment) HPI Comments: Patient presents with hypertension and medication refill. Patient states that he has taken lisinopril, HCTZ, and clonidine for blood pressure for the past 8 years. Patient was recently had his medications refilled at [facility] however he was not given a prescription for clonidine. Patient notes that his blood pressure was elevated last night to 180 systolic. This caused him to be anxious. He comes to the emergency department today for his clonidine or prescription. Patient also states he's been on narcotic pain medications in the past for chronic neck pain however he is now off of these and is not here for pain medicine today. Patient denies severe headache, trouble talking, weakness in extremities, loss of eyesight, chest pain, shortness of breath, change in urination.  The history is provided by the patient.    Past Medical History  Diagnosis Date  . Thyroid disease   . Hypertension   . Anxiety   . Tachycardia - pulse     History reviewed. No pertinent past surgical history.  Family History  Problem Relation Age of Onset  . Stroke Mother   . Hypertension Mother   . Stroke Father   . Hypertension Father     History  Substance Use Topics  . Smoking status: Current Everyday Smoker -- 1.0 packs/day    Types: Cigarettes  . Smokeless tobacco: Not on file  . Alcohol Use: No      Review of Systems  HENT: Positive for neck pain.   Eyes: Negative for visual disturbance.  Respiratory: Negative for shortness of breath.   Cardiovascular: Negative for chest pain and leg swelling.  Genitourinary: Negative for decreased urine volume.  Neurological: Negative for dizziness,  syncope, speech difficulty, weakness, numbness and headaches.    Allergies  Penicillins  Home Medications   Current Outpatient Rx  Name Route Sig Dispense Refill  . HYDROCHLOROTHIAZIDE 25 MG PO TABS Oral Take 25 mg by mouth daily.    Marland Kitchen LISINOPRIL 20 MG PO TABS Oral Take 20 mg by mouth daily.    Marland Kitchen METHOCARBAMOL 500 MG PO TABS Oral Take 1,000 mg by mouth 4 (four) times daily as needed. For muscle relaxant.    . TRAMADOL HCL 50 MG PO TABS Oral Take 50 mg by mouth every 6 (six) hours as needed. For pain.    Marland Kitchen CLONIDINE HCL 0.1 MG PO TABS Oral Take 1 tablet (0.1 mg total) by mouth 2 (two) times daily. 60 tablet 0    BP 140/103  Pulse 102  Resp 18  SpO2 100%  Physical Exam  Nursing note and vitals reviewed. Constitutional: He is oriented to person, place, and time. He appears well-developed and well-nourished.  HENT:  Head: Normocephalic and atraumatic.  Eyes: Conjunctivae are normal.  Neck: Normal range of motion. Neck supple.  Cardiovascular: Normal rate and intact distal pulses.   No murmur heard. Pulmonary/Chest: Effort normal and breath sounds normal. No respiratory distress. He has no wheezes.  Neurological: He is alert and oriented to person, place, and time. He has normal strength. No cranial nerve deficit or sensory deficit. Gait normal. GCS eye subscore is 4. GCS verbal subscore is 5. GCS motor subscore is 6.  Skin: Skin is warm and  dry.  Psychiatric: He has a normal mood and affect.    ED Course  Procedures (including critical care time)  Labs Reviewed - No data to display No results found.   1. Hypertension    3:46 PM Patient seen and examined.    Vital signs reviewed and are as follows: Filed Vitals:   06/14/11 1543  BP: 140/103  Pulse:   Resp:   BP 140/103  Pulse 102  Resp 18  SpO2 100%  Prescription for clonidine given. Patient counseled on warning signs of high blood pressure that should cause him to return to the emergency department. Patient  urged to follow up with his primary care doctor to get all of his prescriptions properly refill. Patient verbalizes understanding and agrees with this plan.   MDM  Patient here for blood pressure medication refill, given. Patient has no signs of end organ damage in relation to his high blood pressure. Blood pressure is 140/103 today. Patient appears well at discharge.        [place], [place] 06/14/11 [phone]

## 2011-06-14 NOTE — Progress Notes (Signed)
self pay [facility] with [facility] came in with high bp [facility] with [facility] Referral to [facility] case manager-Pt confirms he is self pay [place] resident CM and [facility] community liaison spoke with him Pt offered [facility] services to assist with finding a [place] self pay provider Pt accepted information

## 2011-06-14 NOTE — ED Notes (Signed)
Pt states that his bp has been high and was started on medication yesterday and bp 135/97 now , states that he was on methadone for this pain and that he forgot to ask for his clonidine refill for the pst 3 weeks now. No headache

## 2011-06-14 NOTE — Discharge Instructions (Signed)
Please read and follow all provided instructions.  Your diagnoses today include:  1. Hypertension     Tests performed today include:  Vital signs. See below for your results today.   Medications prescribed:   Clonidine - blood pressure medication  Home care instructions:  Follow any educational materials contained in this packet.  Follow-up instructions: Please follow-up with your primary care provider in the next 1 week for further evaluation of your symptoms and medication refills.   If you do not have a primary care doctor -- see below for referral information.   Return instructions:   Please return to the Emergency Department if you experience worsening symptoms.   Return with severe headache, weakness in extremities, trouble talking, vision loss, chest pain, trouble breathing, or difficulty urinating.  Please return if you have any other emergent concerns.  Additional Information:  Your vital signs today were: BP 140/103  Pulse 102  Resp 18  SpO2 100% If your blood pressure (BP) was elevated above 135/85 this visit, please have this repeated by your doctor within one month. -------------- No Primary Care Doctor Call [facility]  [phone] Other agencies that provide inexpensive medical care    [facility]  [phone]    [facility]  [phone]    [facility]  [phone]    [facility]  [phone]    [facility]  [phone]    [facility]  [phone] -------------- RESOURCE GUIDE:  Dental Problems  Patients with Medicaid: [facility] [phone] [address]                                            [phone] [address] Phone:  [phone]                                                   Phone:  [phone]  If unable to pay or uninsured, contact:  [facility] or [facility]. to become qualified for the adult dental clinic.  Chronic Pain  Problems Contact [facility]  [phone] Patients need to be referred by their primary care doctor.  Insufficient Money for Medicine Contact [employer]:  call "211" or [facility] [phone].  Psychological Services [facility]  [phone] [name]  [phone] [facility]   [phone] (emergency services [phone])  Substance Abuse Resources [facility]  [phone] [facility] [phone] [facility] [phone] Floydene Flock [phone] Residential & Outpatient Substance Abuse Program  [phone]  Abuse/Neglect [facility] Child Abuse Hotline 501-531-1516 [facility] Child Abuse Hotline [phone] (After Hours)  Emergency Shelter [facility] [phone]  Maternity Homes [facility] [phone] [facility] [phone]  [facility] Resources  [facility]     [employer]                          [facility] Dept. [address] [place]  [address]      [name]  Blondell Reveal Phone:  [phone]                                   Phone:  [phone]                 Phone:  [phone]  [facility] Phone:  [phone]  [facility] Child Abuse Hotline 867-151-8639 [phone] (After Hours)

## 2011-06-15 NOTE — ED Provider Notes (Signed)
Medical screening examination/treatment/procedure(s) were performed by non-physician practitioner and as supervising physician I was immediately available for consultation/collaboration.  Raeford Razor, MD 06/15/11 [phone]

## 2011-07-10 ENCOUNTER — Emergency Department (HOSPITAL_COMMUNITY)
Admission: EM | Admit: 2011-07-10 | Discharge: 2011-07-10 | Disposition: A | Discharge status: Home / Self Care | Payer: Self-pay | Attending: Emergency Medicine | Admitting: Emergency Medicine

## 2011-07-10 ENCOUNTER — Encounter (HOSPITAL_COMMUNITY): Payer: Self-pay | Admitting: Emergency Medicine

## 2011-07-10 ENCOUNTER — Emergency Department (HOSPITAL_COMMUNITY): Payer: Self-pay

## 2011-07-10 DIAGNOSIS — Z79899 Other long term (current) drug therapy: Secondary | ICD-10-CM | POA: Insufficient documentation

## 2011-07-10 DIAGNOSIS — M545 Low back pain, unspecified: Secondary | ICD-10-CM | POA: Insufficient documentation

## 2011-07-10 DIAGNOSIS — M549 Dorsalgia, unspecified: Secondary | ICD-10-CM

## 2011-07-10 DIAGNOSIS — F17200 Nicotine dependence, unspecified, uncomplicated: Secondary | ICD-10-CM | POA: Insufficient documentation

## 2011-07-10 DIAGNOSIS — F411 Generalized anxiety disorder: Secondary | ICD-10-CM | POA: Insufficient documentation

## 2011-07-10 DIAGNOSIS — W19XXXA Unspecified fall, initial encounter: Secondary | ICD-10-CM

## 2011-07-10 DIAGNOSIS — I10 Essential (primary) hypertension: Secondary | ICD-10-CM | POA: Insufficient documentation

## 2011-07-10 DIAGNOSIS — R209 Unspecified disturbances of skin sensation: Secondary | ICD-10-CM | POA: Insufficient documentation

## 2011-07-10 DIAGNOSIS — E079 Disorder of thyroid, unspecified: Secondary | ICD-10-CM | POA: Insufficient documentation

## 2011-07-10 DIAGNOSIS — IMO0002 Reserved for concepts with insufficient information to code with codable children: Secondary | ICD-10-CM | POA: Insufficient documentation

## 2011-07-10 HISTORY — DX: Reserved for concepts with insufficient information to code with codable children: IMO0002

## 2011-07-10 HISTORY — DX: Spinal stenosis, site unspecified: M48.00

## 2011-07-10 MED ORDER — OXYCODONE-ACETAMINOPHEN 5-325 MG PO TABS
1.0000 | ORAL_TABLET | ORAL | Status: AC | PRN
Start: 2011-07-10 — End: 2011-07-20

## 2011-07-10 MED ORDER — ONDANSETRON 8 MG PO TBDP
8.0000 mg | ORAL_TABLET | Freq: Once | ORAL | Status: AC
  Administered 2011-07-10: 8 mg via ORAL
  Filled 2011-07-10: qty 1

## 2011-07-10 MED ORDER — HYDROMORPHONE HCL PF 2 MG/ML IJ SOLN
2.0000 mg | Freq: Once | INTRAMUSCULAR | Status: AC
  Administered 2011-07-10: 2 mg via INTRAMUSCULAR
  Filled 2011-07-10: qty 1

## 2011-07-10 NOTE — ED Notes (Signed)
Pt presenting to ed with c/o fall last night with left leg pain 10/10. Pt states his leg gave out and he fell

## 2011-07-10 NOTE — ED Notes (Signed)
[id number]<JX> Expected date:<BR> Expected time:<BR> Means of arrival:Ambulance<BR> Comments:<BR> Back Pain

## 2011-07-10 NOTE — ED Provider Notes (Signed)
History     CSN: [id number]  Arrival date & time 07/10/11  4098   First MD Initiated Contact with Patient 07/10/11 [phone]      Chief Complaint  Patient presents with  . Fall  . Back Pain    (Consider location/radiation/quality/duration/timing/severity/associated sxs/prior treatment) HPI Comments: Anay Rathe is a 46 y.o. male who had sudden onset of worsening low back pain last night. This occurred after a fall. The fall was caused by left leg numbness. This is a recurrent problem for the patient. He has spinal stenosis causing numbness. He is awaiting treatment by a neurosurgeon after his disability insurance becomes active.  He has also been troubled by problems in the home, including having to move because of a sewer leak. He tried Ultram for the pain, but it did not work. He denies bowel or urine incontinence at this time. There is no perianal anesthesia.  Patient is a 46 y.o. male presenting with fall and back pain. The history is provided by the patient.  Fall  Back Pain     Past Medical History  Diagnosis Date  . Thyroid disease   . Hypertension   . Anxiety   . Tachycardia - pulse   . Degenerative disk disease   . Spinal stenosis     No past surgical history on file.  Family History  Problem Relation Age of Onset  . Stroke Mother   . Hypertension Mother   . Stroke Father   . Hypertension Father     History  Substance Use Topics  . Smoking status: Current Everyday Smoker -- 1.0 packs/day    Types: Cigarettes  . Smokeless tobacco: Not on file  . Alcohol Use: No      Review of Systems  Musculoskeletal: Positive for back pain.  All other systems reviewed and are negative.    Allergies  Penicillins  Home Medications   Current Outpatient Rx  Name Route Sig Dispense Refill  . CLONIDINE HCL 0.1 MG PO TABS Oral Take 0.1 mg by mouth 2 (two) times daily.    . IBUPROFEN 200 MG PO TABS Oral Take 600 mg by mouth every 6 (six) hours as needed. For pain.      Marland Kitchen LISINOPRIL 20 MG PO TABS Oral Take 20 mg by mouth daily.    Marland Kitchen LORAZEPAM 1 MG PO TABS Oral Take 1 mg by mouth daily.    Marland Kitchen METHOCARBAMOL 500 MG PO TABS Oral Take 1,000 mg by mouth 4 (four) times daily as needed. For muscle relaxant.    . TRAMADOL HCL 50 MG PO TABS Oral Take 50 mg by mouth every 6 (six) hours as needed. For pain.    . OXYCODONE-ACETAMINOPHEN 5-325 MG PO TABS Oral Take 1 tablet by mouth every 4 (four) hours as needed for pain. 20 tablet 0    BP 128/89  Pulse 109  Temp(Src) 98.5 F (36.9 C) (Oral)  Resp 20  SpO2 95%  Physical Exam  Nursing note and vitals reviewed. Constitutional: He is oriented to person, place, and time. He appears well-developed and well-nourished.  HENT:  Head: Normocephalic and atraumatic.  Right Ear: External ear normal.  Left Ear: External ear normal.  Eyes: Conjunctivae and EOM are normal. Pupils are equal, round, and reactive to light.  Neck: Normal range of motion and phonation normal. Neck supple.  Cardiovascular: Normal rate, regular rhythm, normal heart sounds and intact distal pulses.   Pulmonary/Chest: Effort normal and breath sounds normal. He exhibits no bony tenderness.  Abdominal: Soft. Normal appearance. There is no tenderness.  Musculoskeletal: Normal range of motion.       Bilateral lumbar tenderness to light touch. He is able to sit on the stretcher without difficulty. Positive straight leg raising on the left at 15 while sitting.   Neurological: He is alert and oriented to person, place, and time. He has normal strength. No cranial nerve deficit or sensory deficit. He exhibits normal muscle tone. Coordination normal.       Intact extremity sensation  Skin: Skin is warm, dry and intact.  Psychiatric: He has a normal mood and affect. His behavior is normal. Judgment and thought content normal.    ED Course  Procedures (including critical care time)   Emergency department treatment: IM Dilaudid and oral Zofran  Labs  Reviewed - No data to display No results found.   1. Fall   2. Back pain       MDM  Aggravation of chronic low back pain, secondary to fall, doubt fracture, worsening spinal stenosis, cauda equina syndrome, discitis. Patient stable for discharge with outpatient management.   Plan: Home Medications- Percocet; Home Treatments- heat treatment; Recommended follow up- PCP prn        Flint Melter, MD 07/13/11 2101

## 2011-07-10 NOTE — Discharge Instructions (Signed)
Use heat on the sore area 3 times a day.  Back Exercises Back exercises help treat and prevent back injuries. The goal of back exercises is to increase the strength of your abdominal and back muscles and the flexibility of your back. These exercises should be started when you no longer have back pain. Back exercises include:  Pelvic Tilt. Lie on your back with your knees bent. Tilt your pelvis until the lower part of your back is against the floor. Hold this position 5 to 10 sec and repeat 5 to 10 times.   Knee to Chest. Pull first 1 knee up against your chest and hold for 20 to 30 seconds, repeat this with the other knee, and then both knees. This may be done with the other leg straight or bent, whichever feels better.   Sit-Ups or Curl-Ups. Bend your knees 90 degrees. Start with tilting your pelvis, and do a partial, slow sit-up, lifting your trunk only 30 to 45 degrees off the floor. Take at least 2 to 3 seconds for each sit-up. Do not do sit-ups with your knees out straight. If partial sit-ups are difficult, simply do the above but with only tightening your abdominal muscles and holding it as directed.   Hip-Lift. Lie on your back with your knees flexed 90 degrees. Push down with your feet and shoulders as you raise your hips a couple inches off the floor; hold for 10 seconds, repeat 5 to 10 times.   Back arches. Lie on your stomach, propping yourself up on bent elbows. Slowly press on your hands, causing an arch in your low back. Repeat 3 to 5 times. Any initial stiffness and discomfort should lessen with repetition over time.   Shoulder-Lifts. Lie face down with arms beside your body. Keep hips and torso pressed to floor as you slowly lift your head and shoulders off the floor.  Do not overdo your exercises, especially in the beginning. Exercises may cause you some mild back discomfort which lasts for a few minutes; however, if the pain is more severe, or lasts for more than 15 minutes, do not  continue exercises until you see your caregiver. Improvement with exercise therapy for back problems is slow.  See your caregivers for assistance with developing a proper back exercise program. Document Released: 03/28/2004 Document Revised: 02/07/2011 Document Reviewed: 02/18/2005 [facility] Patient Information 2012 [place], [place].Back Pain, Adult Low back pain is very common. About 1 in 5 people have back pain.The cause of low back pain is rarely dangerous. The pain often gets better over time.About half of people with a sudden onset of back pain feel better in just 2 weeks. About 8 in 10 people feel better by 6 weeks.  CAUSES Some common causes of back pain include:  Strain of the muscles or ligaments supporting the spine.   Wear and tear (degeneration) of the spinal discs.   Arthritis.   Direct injury to the back.  DIAGNOSIS Most of the time, the direct cause of low back pain is not known.However, back pain can be treated effectively even when the exact cause of the pain is unknown.Answering your caregiver's questions about your overall health and symptoms is one of the most accurate ways to make sure the cause of your pain is not dangerous. If your caregiver needs more information, he or she may order lab work or imaging tests (X-rays or MRIs).However, even if imaging tests show changes in your back, this usually does not require surgery. HOME CARE INSTRUCTIONS  For many people, back pain returns.Since low back pain is rarely dangerous, it is often a condition that people can learn to [facility] their own.   Remain active. It is stressful on the back to sit or stand in one place. Do not sit, drive, or stand in one place for more than 30 minutes at a time. Take short walks on level surfaces as soon as pain allows.Try to increase the length of time you walk each day.   Do not stay in bed.Resting more than 1 or 2 days can delay your recovery.   Do not avoid exercise or work.Your body  is made to move.It is not dangerous to be active, even though your back may hurt.Your back will likely heal faster if you return to being active before your pain is gone.   Pay attention to your body when you bend and lift. Many people have less discomfortwhen lifting if they bend their knees, keep the load close to their bodies,and avoid twisting. Often, the most comfortable positions are those that put less stress on your recovering back.   Find a comfortable position to sleep. Use a firm mattress and lie on your side with your knees slightly bent. If you lie on your back, put a pillow under your knees.   Only take over-the-counter or prescription medicines as directed by your caregiver. Over-the-counter medicines to reduce pain and inflammation are often the most helpful.Your caregiver may prescribe muscle relaxant drugs.These medicines help dull your pain so you can more quickly return to your normal activities and healthy exercise.   Put ice on the injured area.   Put ice in a plastic bag.   Place a towel between your skin and the bag.   Leave the ice on for 15 to 20 minutes, 3 to 4 times a day for the first 2 to 3 days. After that, ice and heat may be alternated to reduce pain and spasms.   Ask your caregiver about trying back exercises and gentle massage. This may be of some benefit.   Avoid feeling anxious or stressed.Stress increases muscle tension and can worsen back pain.It is important to recognize when you are anxious or stressed and learn ways to manage it.Exercise is a great option.  SEEK MEDICAL CARE IF:  You have pain that is not relieved with rest or medicine.   You have pain that does not improve in 1 week.   You have new symptoms.   You are generally not feeling well.  SEEK IMMEDIATE MEDICAL CARE IF:   You have pain that radiates from your back into your legs.   You develop new bowel or bladder control problems.   You have unusual weakness or numbness  in your arms or legs.   You develop nausea or vomiting.   You develop abdominal pain.   You feel faint.  Document Released: 02/18/2005 Document Revised: 02/07/2011 Document Reviewed: 07/09/2010 [facility] Patient Information 2012 [place], [place].

## 2011-07-25 ENCOUNTER — Encounter (HOSPITAL_COMMUNITY): Payer: Self-pay | Admitting: Emergency Medicine

## 2011-07-25 ENCOUNTER — Emergency Department (HOSPITAL_COMMUNITY)
Admission: EM | Admit: 2011-07-25 | Discharge: 2011-07-25 | Disposition: A | Discharge status: Home / Self Care | Payer: Self-pay | Attending: Emergency Medicine | Admitting: Emergency Medicine

## 2011-07-25 DIAGNOSIS — M549 Dorsalgia, unspecified: Secondary | ICD-10-CM | POA: Insufficient documentation

## 2011-07-25 DIAGNOSIS — I10 Essential (primary) hypertension: Secondary | ICD-10-CM | POA: Insufficient documentation

## 2011-07-25 DIAGNOSIS — T148XXA Other injury of unspecified body region, initial encounter: Secondary | ICD-10-CM | POA: Insufficient documentation

## 2011-07-25 DIAGNOSIS — G8929 Other chronic pain: Secondary | ICD-10-CM | POA: Insufficient documentation

## 2011-07-25 DIAGNOSIS — W108XXA Fall (on) (from) other stairs and steps, initial encounter: Secondary | ICD-10-CM | POA: Insufficient documentation

## 2011-07-25 DIAGNOSIS — F17200 Nicotine dependence, unspecified, uncomplicated: Secondary | ICD-10-CM | POA: Insufficient documentation

## 2011-07-25 MED ORDER — HYDROCODONE-ACETAMINOPHEN 5-325 MG PO TABS
2.0000 | ORAL_TABLET | Freq: Once | ORAL | Status: AC
  Administered 2011-07-25: 2 via ORAL
  Filled 2011-07-25: qty 2

## 2011-07-25 MED ORDER — KETOROLAC TROMETHAMINE 30 MG/ML IJ SOLN: 30.0000 mg | Freq: Once | INTRAMUSCULAR | Status: DC

## 2011-07-25 MED ORDER — HYDROCODONE-ACETAMINOPHEN 5-500 MG PO TABS: 1.0000 | ORAL_TABLET | Freq: Four times a day (QID) | ORAL | Status: AC | PRN

## 2011-07-25 MED ORDER — CYCLOBENZAPRINE HCL 10 MG PO TABS: 10.0000 mg | ORAL_TABLET | Freq: Two times a day (BID) | ORAL | Status: AC | PRN

## 2011-07-25 MED ORDER — IBUPROFEN 800 MG PO TABS: 800.0000 mg | ORAL_TABLET | Freq: Three times a day (TID) | ORAL | Status: DC

## 2011-07-25 NOTE — ED Notes (Signed)
Pt with 10 year back pain hx reports that his back started hurting last night and this am more than usual at 10/10. Pain is in the middle of his back and radiates down left leg. Pt also reports he slipped this am and the sudden movement made him hurt worse.

## 2011-07-25 NOTE — ED Provider Notes (Signed)
History     CSN: [id number]  Arrival date & time 07/25/11  1157   First MD Initiated Contact with Patient 07/25/11 1219      Chief Complaint  Patient presents with  . Back Pain    (Consider location/radiation/quality/duration/timing/severity/associated sxs/prior treatment) Patient is a 46 y.o. male presenting with back pain. The history is provided by the patient and medical records.  Back Pain   Patient with hx of chronic back pain presents to the ER today complaining that he slipped and twisted his lower back while in the shower this morning which created acute on chronic back pain. Patient denies falling to ground or hitting head. He took nothing for pain PTA. States pain is aggravated by movement. Patient had MRI ordered by his PCP recently which he brought with him to ER. Patient denies extremity numbness/tingling/weakness, loss of bowel or bladder function or saddle seat paresthesias. Patient states he is applying for disability so he can pay for the pain management that was recommended by PCP.   Past Medical History  Diagnosis Date  . Thyroid disease   . Hypertension   . Anxiety   . Tachycardia - pulse   . Degenerative disk disease   . Spinal stenosis     History reviewed. No pertinent past surgical history.  Family History  Problem Relation Age of Onset  . Stroke Mother   . Hypertension Mother   . Stroke Father   . Hypertension Father     History  Substance Use Topics  . Smoking status: Current Everyday Smoker -- 1.0 packs/day    Types: Cigarettes  . Smokeless tobacco: Not on file  . Alcohol Use: No      Review of Systems  Musculoskeletal: Positive for back pain.  All other systems reviewed and are negative.    Allergies  Penicillins  Home Medications   Current Outpatient Rx  Name Route Sig Dispense Refill  . CLONIDINE HCL 0.1 MG PO TABS Oral Take 0.1 mg by mouth 2 (two) times daily.    . CYCLOBENZAPRINE HCL 10 MG PO TABS Oral Take 1 tablet (10  mg total) by mouth 2 (two) times daily as needed for muscle spasms. 20 tablet 0  . HYDROCODONE-ACETAMINOPHEN 5-500 MG PO TABS Oral Take 1-2 tablets by mouth every 6 (six) hours as needed for pain. 6 tablet 0  . IBUPROFEN 200 MG PO TABS Oral Take 600 mg by mouth every 6 (six) hours as needed. For pain.    . IBUPROFEN 800 MG PO TABS Oral Take 1 tablet (800 mg total) by mouth 3 (three) times daily. Take 800mg  by mouth at breakfast, lunch and dinner for the next 5 days 21 tablet 0  . LISINOPRIL 20 MG PO TABS Oral Take 20 mg by mouth daily.    Marland Kitchen LORAZEPAM 1 MG PO TABS Oral Take 1 mg by mouth daily.    Marland Kitchen METHOCARBAMOL 500 MG PO TABS Oral Take 1,000 mg by mouth 4 (four) times daily as needed. For muscle relaxant.    . TRAMADOL HCL 50 MG PO TABS Oral Take 50 mg by mouth every 6 (six) hours as needed. For pain.      BP 149/115  Pulse 117  Temp(Src) 98.7 F (37.1 C) (Oral)  Resp 18  SpO2 99%  Physical Exam  Nursing note and vitals reviewed. Constitutional: He is oriented to person, place, and time. He appears well-developed and well-nourished. No distress.  HENT:  Head: Normocephalic and atraumatic.  Eyes: Conjunctivae are  normal.  Neck: Normal range of motion. Neck supple.  Cardiovascular: Normal rate, regular rhythm, normal heart sounds and intact distal pulses.  Exam reveals no gallop and no friction rub.   No murmur heard. Pulmonary/Chest: Effort normal and breath sounds normal. No respiratory distress. He has no wheezes. He has no rales. He exhibits no tenderness.  Abdominal: Soft. Bowel sounds are normal. He exhibits no distension and no mass. There is no tenderness. There is no rebound and no guarding.  Musculoskeletal: Normal range of motion. He exhibits tenderness. He exhibits no edema.       TTP of lumbar paraspinal region but no midline spinal TTP. 5/5 strength of bilateral UE and LE with normal reflexes and sensation.   Neurological: He is alert and oriented to person, place, and  time. He has normal reflexes. Coordination normal.  Skin: Skin is warm and dry. No rash noted. He is not diaphoretic. No erythema.  Psychiatric: He has a normal mood and affect.    ED Course  Procedures (including critical care time)  IM toradol, PO hydrocodone-acetaminophen  Labs Reviewed - No data to display No results found.   1. Chronic back pain   2. Muscle strain       MDM  Acute on chronic back pain with muscle strain like injury but no signs or symptoms of cauda equine or central cord compression. ambulating without difficulty. I spoke at length with patient about need for follow up with PCP or pain management for future management of chronic back pain. He voices his understanding and is agreeable to plan. Will prescribe limited number of narcotic pain meds.   Patient brought copy of recent MRI with no urgent or emergent findings but some degenerative disk and mild stenosis.        [place], [place] 07/25/11 1241

## 2011-07-25 NOTE — Discharge Instructions (Signed)
Take ibuprofen as directed for inflammation and pain with hydrocodone-acetaminophen for breakthrough pain and flexeril for muscle relaxation but do not drive or operate machinery with hydrocodone or flexeril use. Ice to areas of soreness for the next few days and then may move to heat. Expect to be sore for the next few day and follow up with primary care physician for recheck of ongoing symptoms and for further discussion of your chronic pain management but return to ER for emergent changing or worsening of symptoms.    Chronic Back Pain When back pain lasts longer than 3 months, it is called chronic back pain.This pain can be frustrating, but the cause of the pain is rarely dangerous.People with chronic back pain often go through certain periods that are more intense (flare-ups). CAUSES Chronic back pain can be caused by wear and tear (degeneration) on different structures in your back. These structures may include bones, ligaments, or discs. This degeneration may result in more pressure being placed on the nerves that travel to your legs and feet. This can lead to pain traveling from the low back down the back of the legs. When pain lasts longer than 3 months, it is not unusual for people to experience anxiety or depression. Anxiety and depression can also contribute to low back pain. TREATMENT  Establish a regular exercise plan. This is critical to improving your functional level.   Have a self-management plan for when you flare-up. Flare-ups rarely require a medical visit. Regular exercise will help reduce the intensity and frequency of your flare-ups.   Manage how you feel about your back pain and the rest of your life. Anxiety, depression, and feeling that you cannot alter your back pain have been shown to make back pain more intense and debilitating.   Medicines should never be your only treatment. They should be used along with other treatments to help you return to a more active lifestyle.    Procedures such as injections or surgery may be helpful but are rarely necessary. You may be able to get the same results with physical therapy or chiropractic care.  HOME CARE INSTRUCTIONS  Avoid bending, heavy lifting, prolonged sitting, and activities which make the problem worse.   Continue normal activity as much as possible.   Take brief periods of rest throughout the day to reduce your pain during flare-ups.   Follow your back exercise rehabilitation program. This can help reduce symptoms and prevent more pain.   Only take over-the-counter or prescription medicines as directed by your caregiver. Muscle relaxants are sometimes prescribed. Narcotic pain medicine is discouraged for long-term pain, since addiction is a possible outcome.   If you smoke, quit.   Eat healthy foods and maintain a recommended body weight.  SEEK IMMEDIATE MEDICAL CARE IF:   You have weakness or numbness in one of your legs or feet.   You have trouble controlling your bladder or bowels.   You develop nausea, vomiting, abdominal pain, shortness of breath, or fainting.  Document Released: 03/28/2004 Document Revised: 02/07/2011 Document Reviewed: 02/02/2011 [facility] Patient Information 2012 [place], [place].

## 2011-07-25 NOTE — ED Notes (Signed)
Hx back pain. Had fall this am which pulled back and increased pain.

## 2011-07-27 NOTE — ED Provider Notes (Signed)
Medical screening examination/treatment/procedure(s) were performed by non-physician practitioner and as supervising physician I was immediately available for consultation/collaboration.   Akeira Lahm L Harly Pipkins, MD 07/27/11 0737 

## 2011-09-27 ENCOUNTER — Emergency Department (HOSPITAL_COMMUNITY)
Admission: EM | Admit: 2011-09-27 | Discharge: 2011-09-27 | Disposition: A | Discharge status: Home / Self Care | Payer: Self-pay | Attending: Emergency Medicine | Admitting: Emergency Medicine

## 2011-09-27 ENCOUNTER — Encounter (HOSPITAL_COMMUNITY): Payer: Self-pay | Admitting: *Deleted

## 2011-09-27 ENCOUNTER — Emergency Department (HOSPITAL_COMMUNITY): Payer: Self-pay

## 2011-09-27 DIAGNOSIS — E079 Disorder of thyroid, unspecified: Secondary | ICD-10-CM | POA: Insufficient documentation

## 2011-09-27 DIAGNOSIS — M25529 Pain in unspecified elbow: Secondary | ICD-10-CM | POA: Insufficient documentation

## 2011-09-27 DIAGNOSIS — I10 Essential (primary) hypertension: Secondary | ICD-10-CM | POA: Insufficient documentation

## 2011-09-27 DIAGNOSIS — F17200 Nicotine dependence, unspecified, uncomplicated: Secondary | ICD-10-CM | POA: Insufficient documentation

## 2011-09-27 DIAGNOSIS — IMO0002 Reserved for concepts with insufficient information to code with codable children: Secondary | ICD-10-CM | POA: Insufficient documentation

## 2011-09-27 MED ORDER — CYCLOBENZAPRINE HCL 10 MG PO TABS
5.0000 mg | ORAL_TABLET | Freq: Once | ORAL | Status: AC
  Administered 2011-09-27: 5 mg via ORAL
  Filled 2011-09-27: qty 1

## 2011-09-27 MED ORDER — TRAMADOL HCL 50 MG PO TABS
50.0000 mg | ORAL_TABLET | Freq: Four times a day (QID) | ORAL | Status: AC | PRN
Start: 2011-09-27 — End: 2011-10-07

## 2011-09-27 MED ORDER — PREDNISONE 20 MG PO TABS
60.0000 mg | ORAL_TABLET | Freq: Once | ORAL | Status: AC
Start: 2011-09-27 — End: 2011-09-27
  Administered 2011-09-27: 60 mg via ORAL
  Filled 2011-09-27: qty 3

## 2011-09-27 MED ORDER — CYCLOBENZAPRINE HCL 10 MG PO TABS: 10.0000 mg | ORAL_TABLET | Freq: Two times a day (BID) | ORAL | Status: AC | PRN

## 2011-09-27 MED ORDER — PREDNISONE 10 MG PO TABS: 60.0000 mg | ORAL_TABLET | Freq: Every day | ORAL | Status: DC

## 2011-09-27 NOTE — ED Provider Notes (Signed)
Medical screening examination/treatment/procedure(s) were performed by non-physician practitioner and as supervising physician I was immediately available for consultation/collaboration.  Cheri Guppy, MD 09/27/11 [phone]

## 2011-09-27 NOTE — ED Notes (Signed)
Pt reports pain to left arm/shoulder x 4 weeks, pain worse over last few days. Hx wrist fracture. No new injury. Took hydrocodone x 2 last night, with some relief.

## 2011-09-27 NOTE — ED Provider Notes (Signed)
History     CSN: [id number]  Arrival date & time 09/27/11  1420   First MD Initiated Contact with Patient 09/27/11 1507      Chief Complaint  Patient presents with  . Arm Pain  . Shoulder Pain    (Consider location/radiation/quality/duration/timing/severity/associated sxs/prior treatment) HPI  Pt to the ER with complaints of left elbow pain for 1 month that has been much worse over the past couple of days. He denies injury. He denies swelling. He denies redness or rash. He states it hurts when he moves it even the little bit. He has a shoulder sling at home which she says did not help his pain actually made it worse. In states that the pain is shooting down towards his wrist. 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 NAD and VSS  Past Medical History  Diagnosis Date  . Thyroid disease   . Hypertension   . Anxiety   . Tachycardia - pulse   . Degenerative disk disease   . Spinal stenosis     History reviewed. No pertinent past surgical history.  Family History  Problem Relation Age of Onset  . Stroke Mother   . Hypertension Mother   . Stroke Father   . Hypertension Father     History  Substance Use Topics  . Smoking status: Current Everyday Smoker -- 1.0 packs/day    Types: Cigarettes  . Smokeless tobacco: Not on file  . Alcohol Use: No      Review of Systems   HEENT: denies blurry vision or change in hearing PULMONARY: Denies difficulty breathing and SOB CARDIAC: denies chest pain or heart palpitations MUSCULOSKELETAL:  denies being unable to ambulate ABDOMEN AL: denies abdominal pain GU: denies loss of bowel or urinary control NEURO: denies numbness and tingling in extremities SKIN: no new rashes PSYCH: patient denies anxiety or depression. NECK: Pt denies having neck pain     Allergies  Penicillins  Home Medications   Current Outpatient Rx  Name Route Sig Dispense Refill  . CLONIDINE HCL 0.1 MG PO TABS Oral Take 0.1  mg by mouth 2 (two) times daily.    Marland Kitchen HYDROCODONE-ACETAMINOPHEN 5-325 MG PO TABS Oral Take 2 tablets by mouth every 6 (six) hours as needed. For pain.    . IBUPROFEN 200 MG PO TABS Oral Take 600 mg by mouth every 6 (six) hours as needed. For pain.    Marland Kitchen LISINOPRIL 20 MG PO TABS Oral Take 20 mg by mouth daily.    . TRAMADOL HCL 50 MG PO TABS Oral Take 50 mg by mouth every 6 (six) hours as needed. For pain.    . CYCLOBENZAPRINE HCL 10 MG PO TABS Oral Take 1 tablet (10 mg total) by mouth 2 (two) times daily as needed for muscle spasms. 20 tablet 0  . PREDNISONE 10 MG PO TABS Oral Take 6 tablets (60 mg total) by mouth daily. 18 tablet 0  . TRAMADOL HCL 50 MG PO TABS Oral Take 1 tablet (50 mg total) by mouth every 6 (six) hours as needed for pain. 15 tablet 0    BP 157/109  Pulse 72  Temp 98.4 F (36.9 C)  Resp 16  SpO2 97%  Physical Exam  Nursing note and vitals reviewed. Constitutional: He appears well-developed and well-nourished. No distress.  HENT:  Head: Normocephalic and atraumatic.  Eyes: Pupils are equal, round, and reactive to light.  Neck: Normal range of motion. Neck supple.  Cardiovascular: Normal  rate and regular rhythm.   Pulmonary/Chest: Effort normal.  Abdominal: Soft.  Musculoskeletal:       Left forearm: He exhibits tenderness. He exhibits no bony tenderness, no swelling, no edema, no deformity and no laceration.       Pt has range with motion, muscle is soft, no rash, redness, induration, erythema or swelling. Pain with all ROM.  Neurological: He is alert.  Skin: Skin is warm and dry.    ED Course  Procedures (including critical care time)  Labs Reviewed - No data to display Dg Elbow Complete Left  09/27/2011  *RADIOLOGY REPORT*  Clinical Data: 46 year old male with left elbow pain and swelling.  LEFT ELBOW - COMPLETE 3+ VIEW  Comparison: 12/19/2003  Findings: No evidence of acute fracture, subluxation or dislocation identified.  No joint effusion noted.  No  radio-opaque foreign bodies are present.  No focal bony lesions are noted.  Minimal degenerative changes of the humeral-ulnar joint noted.  IMPRESSION: No evidence of acute abnormality.  Minimal degenerative changes.  Original Report Authenticated By: Rosendo Gros, M.D.     1. Elbow pain       MDM  Pt requests Rx for Tramadol, which I have prescribed for him. He has been given a referral to Dr. Turner Daniels with Ortho for further work-up. He has also been given flexeril and prednisone for 5 days. Pt has shoulder sling at home.   Pt has been advised of the symptoms that warrant their return to the ED. Patient has voiced understanding and has agreed to follow-up with the PCP or specialist.      Dorthula Matas, PA 09/27/11 1625

## 2011-10-28 ENCOUNTER — Encounter (HOSPITAL_COMMUNITY): Payer: Self-pay | Admitting: Emergency Medicine

## 2011-10-28 ENCOUNTER — Emergency Department (HOSPITAL_COMMUNITY)
Admission: EM | Admit: 2011-10-28 | Discharge: 2011-10-28 | Disposition: A | Discharge status: Home / Self Care | Payer: Self-pay | Attending: Emergency Medicine | Admitting: Emergency Medicine

## 2011-10-28 DIAGNOSIS — IMO0002 Reserved for concepts with insufficient information to code with codable children: Secondary | ICD-10-CM | POA: Insufficient documentation

## 2011-10-28 DIAGNOSIS — F411 Generalized anxiety disorder: Secondary | ICD-10-CM | POA: Insufficient documentation

## 2011-10-28 DIAGNOSIS — F17200 Nicotine dependence, unspecified, uncomplicated: Secondary | ICD-10-CM | POA: Insufficient documentation

## 2011-10-28 DIAGNOSIS — I10 Essential (primary) hypertension: Secondary | ICD-10-CM | POA: Insufficient documentation

## 2011-10-28 DIAGNOSIS — E079 Disorder of thyroid, unspecified: Secondary | ICD-10-CM | POA: Insufficient documentation

## 2011-10-28 DIAGNOSIS — M79603 Pain in arm, unspecified: Secondary | ICD-10-CM

## 2011-10-28 DIAGNOSIS — M79609 Pain in unspecified limb: Secondary | ICD-10-CM | POA: Insufficient documentation

## 2011-10-28 MED ORDER — LISINOPRIL 20 MG PO TABS: 20.0000 mg | ORAL_TABLET | Freq: Every day | ORAL | Status: DC

## 2011-10-28 MED ORDER — OXYCODONE-ACETAMINOPHEN 5-325 MG PO TABS
2.0000 | ORAL_TABLET | Freq: Once | ORAL | Status: AC
  Administered 2011-10-28: 2 via ORAL
  Filled 2011-10-28: qty 2

## 2011-10-28 MED ORDER — CLONIDINE HCL 0.2 MG PO TABS: 0.2000 mg | ORAL_TABLET | Freq: Two times a day (BID) | ORAL | Status: DC

## 2011-10-28 MED ORDER — TRAMADOL HCL 50 MG PO TABS: 50.0000 mg | ORAL_TABLET | Freq: Four times a day (QID) | ORAL | Status: AC | PRN

## 2011-10-28 NOTE — ED Notes (Signed)
Pt reports he is also out of BP meds for 2 weeks.

## 2011-10-28 NOTE — ED Notes (Signed)
Pt told to see DR Ranell Patrick and left with 3 prescriptions and walked to D/C window

## 2011-10-28 NOTE — ED Notes (Signed)
Pt reports 4 week hx of left arm pain that radiates to his neck and back. Pt came here to and was told to go to [facility] but it has closed. Pt reports pain 10/10 left arm.

## 2011-10-28 NOTE — ED Provider Notes (Signed)
History     CSN: [id number]  Arrival date & time 10/28/11  1114   First MD Initiated Contact with Patient 10/28/11 1215      No chief complaint on file.   (Consider location/radiation/quality/duration/timing/severity/associated sxs/prior treatment) HPI Comments: Travis [name] 46 y.o. male   The chief complaint is :Left arm pain    Patient with history of chronic neck and back pain presents with cc 2 months of left arm pain radiating from his neck and upper back.  He was seen here 2 months ago for the same complaint.  He has a history of chronic pain/DDD of the spine and was followed at [facility] for his chronic conditions which include anxiety and HTN.  Patient states that tramadol he was given at last visit was effective in managing his pain.  He rates pain at 10/10 today.  Describes it as sharp and shooting. Denies paresthesia or weakness.Pain is unchanged since last visit. He also states that he has not taken his antihypertensives for the last 2 weeks.  Denies fevers, chills, nausea, vomiting, diarrhea, abdominal pain, CP, SOB, Visual changes, Headache, dizzinesss, lightheadedness, or weakness.    The history is provided by the patient and medical records. No language interpreter was used.    Past Medical History  Diagnosis Date  . Thyroid disease   . Hypertension   . Anxiety   . Tachycardia - pulse   . Degenerative disk disease   . Spinal stenosis     History reviewed. No pertinent past surgical history.  Family History  Problem Relation Age of Onset  . Stroke Mother   . Hypertension Mother   . Stroke Father   . Hypertension Father     History  Substance Use Topics  . Smoking status: Current Everyday Smoker -- 1.0 packs/day    Types: Cigarettes  . Smokeless tobacco: Not on file  . Alcohol Use: No      Review of Systems  Constitutional: Negative for fever and chills.  Eyes: Negative for visual disturbance.  Respiratory: Negative for shortness of  breath.   Cardiovascular: Negative for chest pain.  Gastrointestinal: Negative for nausea, abdominal pain, diarrhea and constipation.  Musculoskeletal: Positive for back pain. Negative for joint swelling.  Psychiatric/Behavioral: The patient is nervous/anxious.     Allergies  Penicillins  Home Medications   Current Outpatient Rx  Name Route Sig Dispense Refill  . ALPRAZOLAM 1 MG PO TABS Oral Take 1 mg by mouth daily.    Marland Kitchen CLONIDINE HCL 0.1 MG PO TABS Oral Take 0.1 mg by mouth 2 (two) times daily.    . IBUPROFEN 200 MG PO TABS Oral Take 600 mg by mouth every 6 (six) hours as needed. For pain    . LISINOPRIL 20 MG PO TABS Oral Take 20 mg by mouth daily.    Marland Kitchen CLONIDINE HCL 0.2 MG PO TABS Oral Take 1 tablet (0.2 mg total) by mouth 2 (two) times daily. 60 tablet 0  . LISINOPRIL 20 MG PO TABS Oral Take 1 tablet (20 mg total) by mouth daily. 30 tablet 0  . PREDNISONE 10 MG PO TABS Oral Take 6 tablets (60 mg total) by mouth daily. 18 tablet 0  . TRAMADOL HCL 50 MG PO TABS Oral Take 1 tablet (50 mg total) by mouth every 6 (six) hours as needed for pain. 30 tablet 0    BP 158/104  Pulse 107  Temp 98.7 F (37.1 C)  Resp 18  SpO2 100%  Physical Exam  Nursing note and vitals reviewed. Constitutional: He appears well-developed and well-nourished. No distress.  HENT:  Head: Normocephalic and atraumatic.  Eyes: Conjunctivae are normal. No scleral icterus.  Neck: Normal range of motion. Neck supple.  Cardiovascular: Normal rate, regular rhythm and normal heart sounds.   Pulmonary/Chest: Effort normal and breath sounds normal. No respiratory distress.  Abdominal: Soft. There is no tenderness.  Musculoskeletal: Normal range of motion. He exhibits no edema.       ROM is at baseline per patient. ROM limited by pain.  Neurological: He is alert.  Skin: Skin is warm and dry. He is not diaphoretic.  Psychiatric: His behavior is normal.    ED Course  Procedures (including critical care  time)  Labs Reviewed - No data to display No results found.  BP 158/104  Pulse 107  Temp 98.7 F (37.1 C)  Resp 18  SpO2 100%  Imaging at las visit did not reveal acute pathology. Pain is likely radicular as a result of his DDD.  There are no red flag sxs that warrant further imaging at this time.  I will refill the patient's tramadol and antihypertensives.  He has asxs hypertension at this time. i have instrcuted him to take his medication as prescried and discussed the sxs of stroke. I also discussed checking BP daily and reurning if his pressures do not resolve after taking his medications. All questions answered fully.  1. Arm pain   2. Hypertension       MDM  Discussed reasons to seek immediate care. Patient expresses understanding and agrees with plan.         Arthor Captain, PA-C 10/28/11 2235

## 2011-10-29 NOTE — ED Provider Notes (Signed)
Medical screening examination/treatment/procedure(s) were performed by non-physician practitioner and as supervising physician I was immediately available for consultation/collaboration.   Marwan T Powers, MD 10/29/11 0708 

## 2011-11-23 ENCOUNTER — Encounter (HOSPITAL_COMMUNITY): Payer: Self-pay | Admitting: Emergency Medicine

## 2011-11-23 ENCOUNTER — Emergency Department (HOSPITAL_COMMUNITY)
Admission: EM | Admit: 2011-11-23 | Discharge: 2011-11-23 | Disposition: A | Discharge status: Home / Self Care | Payer: Self-pay | Attending: Emergency Medicine | Admitting: Emergency Medicine

## 2011-11-23 DIAGNOSIS — IMO0002 Reserved for concepts with insufficient information to code with codable children: Secondary | ICD-10-CM | POA: Insufficient documentation

## 2011-11-23 DIAGNOSIS — M549 Dorsalgia, unspecified: Secondary | ICD-10-CM

## 2011-11-23 DIAGNOSIS — F411 Generalized anxiety disorder: Secondary | ICD-10-CM | POA: Insufficient documentation

## 2011-11-23 DIAGNOSIS — G8929 Other chronic pain: Secondary | ICD-10-CM | POA: Insufficient documentation

## 2011-11-23 DIAGNOSIS — E079 Disorder of thyroid, unspecified: Secondary | ICD-10-CM | POA: Insufficient documentation

## 2011-11-23 DIAGNOSIS — S335XXA Sprain of ligaments of lumbar spine, initial encounter: Secondary | ICD-10-CM | POA: Insufficient documentation

## 2011-11-23 DIAGNOSIS — X500XXA Overexertion from strenuous movement or load, initial encounter: Secondary | ICD-10-CM | POA: Insufficient documentation

## 2011-11-23 DIAGNOSIS — F17200 Nicotine dependence, unspecified, uncomplicated: Secondary | ICD-10-CM | POA: Insufficient documentation

## 2011-11-23 DIAGNOSIS — S39012A Strain of muscle, fascia and tendon of lower back, initial encounter: Secondary | ICD-10-CM

## 2011-11-23 DIAGNOSIS — Z880 Allergy status to penicillin: Secondary | ICD-10-CM | POA: Insufficient documentation

## 2011-11-23 DIAGNOSIS — I10 Essential (primary) hypertension: Secondary | ICD-10-CM | POA: Insufficient documentation

## 2011-11-23 MED ORDER — TRAMADOL HCL 50 MG PO TABS: 50.0000 mg | ORAL_TABLET | Freq: Four times a day (QID) | ORAL | Status: DC | PRN

## 2011-11-23 NOTE — ED Provider Notes (Signed)
Medical screening examination/treatment/procedure(s) were performed by non-physician practitioner and as supervising physician I was immediately available for consultation/collaboration.    Jacobus Colvin R Dajanay Northrup, MD 11/23/11 1634 

## 2011-11-23 NOTE — ED Provider Notes (Signed)
History     CSN: [id number]  Arrival date & time 11/23/11  1113   First MD Initiated Contact with Patient 11/23/11 1144      Chief Complaint  Patient presents with  . Back Pain    (Consider location/radiation/quality/duration/timing/severity/associated sxs/prior treatment) HPI Comments: 46 year old male presents emergency department with low back pain that he has had for 15 years worsening after pulling a muscle while tying his shoe yesterday. [facility], however they closed. He received one month worth of tramadol one month ago when he was seen in the emergency department. He was given the name of clinics to followup with, and states he is on the list to be seen at the outpatient clinic at [facility]. Denies any numbness or tingling down his legs. Admits to pain shooting down the left leg with ambulation. Denies any bowel or bladder dysfunction or saddle anesthesia.  Patient is a 46 y.o. male presenting with back pain. The history is provided by the patient.  Back Pain  Pertinent negatives include no fever, no numbness and no abdominal pain.    Past Medical History  Diagnosis Date  . Thyroid disease   . Hypertension   . Anxiety   . Tachycardia - pulse   . Degenerative disk disease   . Spinal stenosis   . Degenerative disk disease     History reviewed. No pertinent past surgical history.  Family History  Problem Relation Age of Onset  . Stroke Mother   . Hypertension Mother   . Stroke Father   . Hypertension Father     History  Substance Use Topics  . Smoking status: Current Every Day Smoker -- 0.2 packs/day    Types: Cigarettes  . Smokeless tobacco: Not on file  . Alcohol Use: No      Review of Systems  Constitutional: Negative for fever and chills.  HENT: Negative for neck pain and neck stiffness.   Gastrointestinal: Negative for nausea and abdominal pain.       No bowel dysfunction  Genitourinary:       No  bladder dysfunction.  Musculoskeletal: Positive for back pain. Negative for gait problem.  Skin: Negative for color change.  Neurological: Negative for numbness.    Allergies  Penicillins and Toradol  Home Medications   Current Outpatient Rx  Name Route Sig Dispense Refill  . CLONIDINE HCL 0.2 MG PO TABS Oral Take 0.2 mg by mouth 2 (two) times daily.    . IBUPROFEN 200 MG PO TABS Oral Take 600 mg by mouth every 6 (six) hours as needed. For pain    . LISINOPRIL 20 MG PO TABS Oral Take 20 mg by mouth daily.      BP 106/85  Pulse 76  Temp 98.2 F (36.8 C) (Oral)  Resp 16  Ht 5\' 10"  (1.778 m)  Wt 280 lb (127.007 kg)  BMI 40.18 kg/m2  SpO2 96%  Physical Exam  Constitutional: He is oriented to person, place, and time. He appears well-developed and well-nourished. No distress.  HENT:  Head: Normocephalic and atraumatic.  Eyes: Conjunctivae normal are normal.  Neck: Normal range of motion.  Cardiovascular: Normal rate, regular rhythm, normal heart sounds and intact distal pulses.   Pulmonary/Chest: Effort normal and breath sounds normal.  Musculoskeletal:       Lumbar back: He exhibits decreased range of motion (due to pain) and tenderness (left paraspinal muscles). He exhibits no bony tenderness, no edema,  no deformity, no spasm and normal pulse.  Neurological: He is alert and oriented to person, place, and time. He has normal strength and normal reflexes. No sensory deficit. Gait normal.  Psychiatric: His speech is normal. His affect is blunt. He is slowed. He exhibits a depressed mood.       Poor eye contact    ED Course  Procedures (including critical care time)  Labs Reviewed - No data to display No results found.   1. Back strain   2. Chronic back pain       MDM  45 year old male with chronic pain. No red flags concerning patient's back pain. No s/s of central cord compression or cauda equina. Lower extremities are neurovascularly intact and patient is  ambulating without difficulty. He is aware that he can keep getting his pain medication in the emergency department. I will give him some tramadol, however he understands he is to followup with the primary care physician.        Trevor Mace, PA-C 11/23/11 1228

## 2011-11-23 NOTE — ED Notes (Signed)
Pt presents to ED c/o lower back pain. Pt has DDD and spinal stenosis-chronic back pain that has increased this am. Pt denies any urinary symptoms,N/V or fever. Pt in NAD, A&Ox4.

## 2011-12-10 ENCOUNTER — Encounter (HOSPITAL_COMMUNITY): Payer: Self-pay | Admitting: Physical Medicine and Rehabilitation

## 2011-12-10 ENCOUNTER — Emergency Department (HOSPITAL_COMMUNITY)
Admission: EM | Admit: 2011-12-10 | Discharge: 2011-12-10 | Disposition: A | Discharge status: Home / Self Care | Payer: Self-pay | Attending: Emergency Medicine | Admitting: Emergency Medicine

## 2011-12-10 DIAGNOSIS — I10 Essential (primary) hypertension: Secondary | ICD-10-CM | POA: Insufficient documentation

## 2011-12-10 DIAGNOSIS — E079 Disorder of thyroid, unspecified: Secondary | ICD-10-CM | POA: Insufficient documentation

## 2011-12-10 DIAGNOSIS — M48061 Spinal stenosis, lumbar region without neurogenic claudication: Secondary | ICD-10-CM

## 2011-12-10 DIAGNOSIS — IMO0002 Reserved for concepts with insufficient information to code with codable children: Secondary | ICD-10-CM | POA: Insufficient documentation

## 2011-12-10 DIAGNOSIS — F17200 Nicotine dependence, unspecified, uncomplicated: Secondary | ICD-10-CM | POA: Insufficient documentation

## 2011-12-10 LAB — POCT I-STAT, CHEM 8
BUN: 4 mg/dL — ABNORMAL LOW (ref 6–23)
Creatinine, Ser: 0.9 mg/dL (ref 0.50–1.35)
Hemoglobin: 17.3 g/dL — ABNORMAL HIGH (ref 13.0–17.0)
Potassium: 3.4 mEq/L — ABNORMAL LOW (ref 3.5–5.1)
Sodium: 141 mEq/L (ref 135–145)

## 2011-12-10 MED ORDER — CLONIDINE HCL 0.2 MG PO TABS: 0.2000 mg | ORAL_TABLET | Freq: Two times a day (BID) | ORAL | Status: DC

## 2011-12-10 MED ORDER — HYDROMORPHONE HCL PF 1 MG/ML IJ SOLN
1.0000 mg | Freq: Once | INTRAMUSCULAR | Status: AC
  Administered 2011-12-10: 1 mg via INTRAMUSCULAR
  Filled 2011-12-10: qty 1

## 2011-12-10 MED ORDER — LISINOPRIL 20 MG PO TABS: 20.0000 mg | ORAL_TABLET | Freq: Every day | ORAL | Status: DC

## 2011-12-10 MED ORDER — TRAMADOL HCL 50 MG PO TABS: 50.0000 mg | ORAL_TABLET | Freq: Four times a day (QID) | ORAL | Status: DC | PRN

## 2011-12-10 NOTE — ED Notes (Signed)
Pt presents to department for evaluation of chronic lower back pain and hypertension. States back pain recently became worse after he ran out of pain medications. Also states he hasn't taken his BP medications in several weeks. 10/10 pain at the time. He is alert and oriented x4. Ambulatory to triage.

## 2011-12-10 NOTE — ED Notes (Signed)
Patient is also here due to not having any access to get his bp medications.  He was being seen by [facility] and now has no resources

## 2011-12-10 NOTE — ED Provider Notes (Signed)
History     CSN: [id number]  Arrival date & time 12/10/11  0740   First MD Initiated Contact with Patient 12/10/11 0818      Chief Complaint  Patient presents with  . Back Pain  . Hypertension    (Consider location/radiation/quality/duration/timing/severity/associated sxs/prior treatment) Patient is a 46 y.o. male presenting with back pain. The history is provided by the patient.  Back Pain  This is a chronic problem. Episode onset: worse for the last 2 days. The problem occurs constantly. The problem has not changed since onset.The pain is associated with falling. The pain is present in the lumbar spine. The pain radiates to the left thigh, left knee and left foot. The pain is at a severity of 10/10. The pain is severe. The symptoms are aggravated by bending, twisting and certain positions. The pain is the same all the time. Stiffness is present all day. Pertinent negatives include no chest pain, no fever, no numbness, no weight loss, no headaches, no abdominal pain, no abdominal swelling, no bowel incontinence, no perianal numbness, no bladder incontinence, no paresthesias, no paresis and no tingling. He has tried nothing for the symptoms. The treatment provided no relief. Risk factors include obesity, lack of exercise and a history of steroid use.    Past Medical History  Diagnosis Date  . Thyroid disease   . Hypertension   . Anxiety   . Tachycardia - pulse   . Degenerative disk disease   . Spinal stenosis   . Degenerative disk disease     No past surgical history on file.  Family History  Problem Relation Age of Onset  . Stroke Mother   . Hypertension Mother   . Stroke Father   . Hypertension Father     History  Substance Use Topics  . Smoking status: Current Every Day Smoker -- 0.2 packs/day    Types: Cigarettes  . Smokeless tobacco: Not on file  . Alcohol Use: No      Review of Systems  Constitutional: Negative for fever and weight loss.  Respiratory:  Negative for cough and shortness of breath.   Cardiovascular: Negative for chest pain.  Gastrointestinal: Negative for nausea, vomiting, abdominal pain, diarrhea and bowel incontinence.  Genitourinary: Negative for bladder incontinence.  Musculoskeletal: Positive for back pain.  Neurological: Negative for tingling, numbness, headaches and paresthesias.  All other systems reviewed and are negative.    Allergies  Penicillins and Toradol  Home Medications   Current Outpatient Rx  Name Route Sig Dispense Refill  . CLONIDINE HCL 0.2 MG PO TABS Oral Take 0.2 mg by mouth 2 (two) times daily.    . IBUPROFEN 200 MG PO TABS Oral Take 600 mg by mouth every 6 (six) hours as needed. For pain    . LISINOPRIL 20 MG PO TABS Oral Take 20 mg by mouth daily.    . TRAMADOL HCL 50 MG PO TABS Oral Take 1 tablet (50 mg total) by mouth every 6 (six) hours as needed for pain. 20 tablet 0    BP 171/121  Pulse 119  Temp 98.7 F (37.1 C) (Oral)  Resp 20  SpO2 97%  Physical Exam  Nursing note and vitals reviewed. Constitutional: He is oriented to person, place, and time. He appears well-developed and well-nourished. No distress.  HENT:  Head: Normocephalic and atraumatic.  Eyes: Pupils are equal, round, and reactive to light.  Cardiovascular: Normal rate and normal heart sounds.   Pulmonary/Chest: Effort normal and breath sounds normal. No  respiratory distress.  Abdominal: Soft. He exhibits no distension. There is no tenderness.  Musculoskeletal: Normal range of motion.  Neurological: He is alert and oriented to person, place, and time. No cranial nerve deficit. He exhibits normal muscle tone. Coordination normal.  Skin: Skin is warm and dry.     Psychiatric: He has a normal mood and affect.    ED Course  Procedures (including critical care time)  Labs Reviewed  POCT I-STAT, CHEM 8 - Abnormal; Notable for the following:    Potassium 3.4 (*)     BUN 4 (*)     Glucose, Bld 104 (*)      Calcium, Ion 1.10 (*)     Hemoglobin 17.3 (*)     All other components within normal limits   No results found.   1. HYPERTENSION   2. STENOSIS, LUMBAR SPINE       MDM  8:18 AM Pt seen and examined. Pt with chronic back pain related to stenosis and degenerative disc disease. He used to get tramadol and BP meds [facility] but has not been able to find a doctor since (is on list at outpatient clinic). Will treat acute pain (had a fall two days ago. Pt with mild decrease in strength of leg leg likely related to pain and chronic left leg pain. No red flag symptoms so do not feel patient needs imaging at this point. Will get Istat Cr before putting patient back on his lisinopril. Will give short Rx (15 pills) of tramadol to get patient through acute pain.  Cr normal. Pt requests clonidine not lisinopril.        Daleen Bo, MD 12/10/11 [phone]

## 2011-12-11 NOTE — ED Provider Notes (Signed)
I saw and evaluated the patient, reviewed the resident's note and I agree with the findings and plan.  Pain treated. Likely msk back pain. Abdomen benign. Well appearing. Home with BP meds  Lyanne Co, MD 12/11/11 [phone]

## 2011-12-17 ENCOUNTER — Emergency Department (HOSPITAL_COMMUNITY)
Admission: EM | Admit: 2011-12-17 | Discharge: 2011-12-17 | Disposition: A | Discharge status: Home / Self Care | Payer: Self-pay | Attending: Emergency Medicine | Admitting: Emergency Medicine

## 2011-12-17 DIAGNOSIS — IMO0002 Reserved for concepts with insufficient information to code with codable children: Secondary | ICD-10-CM | POA: Insufficient documentation

## 2011-12-17 DIAGNOSIS — F411 Generalized anxiety disorder: Secondary | ICD-10-CM | POA: Insufficient documentation

## 2011-12-17 DIAGNOSIS — I10 Essential (primary) hypertension: Secondary | ICD-10-CM | POA: Insufficient documentation

## 2011-12-17 DIAGNOSIS — M549 Dorsalgia, unspecified: Secondary | ICD-10-CM

## 2011-12-17 DIAGNOSIS — F17200 Nicotine dependence, unspecified, uncomplicated: Secondary | ICD-10-CM | POA: Insufficient documentation

## 2011-12-17 DIAGNOSIS — E079 Disorder of thyroid, unspecified: Secondary | ICD-10-CM | POA: Insufficient documentation

## 2011-12-17 MED ORDER — TRAMADOL HCL 50 MG PO TABS: 50.0000 mg | ORAL_TABLET | Freq: Four times a day (QID) | ORAL | Status: DC | PRN

## 2011-12-17 NOTE — ED Provider Notes (Signed)
History     CSN: [id number]  Arrival date & time 12/17/11  0711   First MD Initiated Contact with Patient 12/17/11 0719      Chief Complaint  Patient presents with  . Back Pain    (Consider location/radiation/quality/duration/timing/severity/associated sxs/prior treatment) Patient is a 46 y.o. male presenting with back pain. The history is provided by the patient.  Back Pain  Pertinent negatives include no chest pain, no fever, no numbness, no headaches, no abdominal pain and no weakness.  pt with hx ddd, chronic low back pain states is out of meds, pcp used to be [facility], and needs refill of his pain med. States had near fall recently that exacerbated his pain. Constant, dull, non radiating. Worse w bending, turning. No radicular pain. No numbness/weakness. No gi or gu c/o. No fever or chills.     Past Medical History  Diagnosis Date  . Thyroid disease   . Hypertension   . Anxiety   . Tachycardia - pulse   . Degenerative disk disease   . Spinal stenosis   . Degenerative disk disease     No past surgical history on file.  Family History  Problem Relation Age of Onset  . Stroke Mother   . Hypertension Mother   . Stroke Father   . Hypertension Father     History  Substance Use Topics  . Smoking status: Current Every Day Smoker -- 0.2 packs/day    Types: Cigarettes  . Smokeless tobacco: Not on file  . Alcohol Use: No      Review of Systems  Constitutional: Negative for fever.  HENT: Negative for neck pain.   Eyes: Negative for redness.  Respiratory: Negative for shortness of breath.   Cardiovascular: Negative for chest pain.  Gastrointestinal: Negative for abdominal pain.  Genitourinary: Negative for flank pain.  Musculoskeletal: Positive for back pain.  Skin: Negative for rash.  Neurological: Negative for weakness, numbness and headaches.  Hematological: Does not bruise/bleed easily.  Psychiatric/Behavioral: Negative for confusion.    Allergies    Blueberry flavor; Penicillins; and Toradol  Home Medications   Current Outpatient Rx  Name Route Sig Dispense Refill  . CLONIDINE HCL 0.2 MG PO TABS Oral Take 1 tablet (0.2 mg total) by mouth 2 (two) times daily. 60 tablet 1  . IBUPROFEN 200 MG PO TABS Oral Take 600 mg by mouth every 6 (six) hours as needed. For pain    . LISINOPRIL 20 MG PO TABS Oral Take 1 tablet (20 mg total) by mouth daily. 30 tablet 0  . SALINE NASAL SPRAY 0.65 % NA SOLN Nasal Place 1 spray into the nose as needed. For stuffy nose    . TRAMADOL HCL 50 MG PO TABS Oral Take 1 tablet (50 mg total) by mouth every 6 (six) hours as needed for pain. 15 tablet 0    BP 139/105  Pulse 101  Temp 98.3 F (36.8 C) (Oral)  Resp 20  SpO2 98%  Physical Exam  Nursing note and vitals reviewed. Constitutional: He is oriented to person, place, and time. He appears well-developed and well-nourished. No distress.  HENT:  Head: Atraumatic.  Eyes: Conjunctivae normal are normal.  Neck: Neck supple. No tracheal deviation present.  Cardiovascular: Normal rate.   Pulmonary/Chest: Effort normal. No accessory muscle usage. No respiratory distress.  Abdominal: Soft. Bowel sounds are normal. He exhibits no distension and no mass. There is no tenderness. There is no rebound and no guarding.  Genitourinary:  No cva tenderness  Musculoskeletal: Normal range of motion. He exhibits no edema and no tenderness.       CTLS spine, non tender, aligned, no step off. Lumbar muscle tenderness  Neurological: He is alert and oriented to person, place, and time. He displays normal reflexes.       Motor intact bil. Steady gait   Skin: Skin is warm and dry.  Psychiatric: He has a normal mood and affect.    ED Course  Procedures (including critical care time)     MDM  Spine nt.  negative  Pt says has his bp med, needs refill of his pain med.   Discussed need pcp follow up, incl for future med refills.         Suzi Roots,  MD 12/17/11 [phone]

## 2011-12-17 NOTE — ED Notes (Addendum)
Pt [facility] pain that radiates down left leg.  Pt was seen here at this ED last week for same and got his tramdol pain meds filled at that time.  Pt has run out of pain med and states he cannot walk well when doesn't have meds.  Pt has MRI report.  Pt states no dr in [place] can take new patient.  Pt does not have a confirmed dr appt.  Pt also co muscle spasms in lower back

## 2011-12-22 ENCOUNTER — Encounter (HOSPITAL_COMMUNITY): Payer: Self-pay | Admitting: Nurse Practitioner

## 2011-12-22 ENCOUNTER — Emergency Department (HOSPITAL_COMMUNITY)
Admission: EM | Admit: 2011-12-22 | Discharge: 2011-12-22 | Disposition: A | Discharge status: Home / Self Care | Payer: Self-pay | Attending: Emergency Medicine | Admitting: Emergency Medicine

## 2011-12-22 DIAGNOSIS — E079 Disorder of thyroid, unspecified: Secondary | ICD-10-CM | POA: Insufficient documentation

## 2011-12-22 DIAGNOSIS — E669 Obesity, unspecified: Secondary | ICD-10-CM | POA: Insufficient documentation

## 2011-12-22 DIAGNOSIS — M549 Dorsalgia, unspecified: Secondary | ICD-10-CM | POA: Insufficient documentation

## 2011-12-22 DIAGNOSIS — F17200 Nicotine dependence, unspecified, uncomplicated: Secondary | ICD-10-CM | POA: Insufficient documentation

## 2011-12-22 DIAGNOSIS — I10 Essential (primary) hypertension: Secondary | ICD-10-CM | POA: Insufficient documentation

## 2011-12-22 MED ORDER — TRAMADOL HCL 50 MG PO TABS: 50.0000 mg | ORAL_TABLET | Freq: Four times a day (QID) | ORAL | Status: DC | PRN

## 2011-12-22 MED ORDER — LISINOPRIL 20 MG PO TABS: 20.0000 mg | ORAL_TABLET | Freq: Every day | ORAL | Status: DC

## 2011-12-22 MED ORDER — CLONIDINE HCL 0.2 MG PO TABS: 0.2000 mg | ORAL_TABLET | Freq: Two times a day (BID) | ORAL | Status: DC

## 2011-12-22 NOTE — ED Notes (Signed)
Pt reports hx spinal stenosis and deg disk. Larey Seat a few weeks ago and having severe lower back, shoulder, upper back pain since. States his pain had been controlled with daily tramadol but [facility] closed and he cant get a refill. States he was here earlier in week for pain shot but pain persists. ambulatory with cane

## 2011-12-22 NOTE — ED Provider Notes (Signed)
History   This chart was scribed for Travis Booze, MD by Sofie Rower. The patient was seen in room TR08C/TR08C and the patient's care was started at 11:11AM.     CSN: [id number]  Arrival date & time 12/22/11  1004   First MD Initiated Contact with Patient 12/22/11 1111      Chief Complaint  Patient presents with  . Back Pain    (Consider location/radiation/quality/duration/timing/severity/associated sxs/prior treatment) Patient is a 46 y.o. male presenting with back pain. The history is provided by the patient. No language interpreter was used.  Back Pain  This is a recurrent problem. The current episode started more than 1 week ago. The problem occurs constantly. The problem has been gradually worsening. The pain is associated with falling. The pain is present in the lumbar spine. The pain does not radiate. The pain is moderate. The pain is the same all the time. Pertinent negatives include no tingling and no weakness. He has tried nothing for the symptoms. The treatment provided no relief.    Travis [name] is a 46 y.o. male , with a hx of spinal stenosis and degenerative disk disease, who presents to the Emergency Department complaining of  sudden, progressively worsening, back pain, located at the lower back, onset one week ago.  Associated symptoms include weakness within the lower back. The pt reports he fell last week, impacted upon his lower back, and has been in pain ever since. Additionally, the pt informs he has been seen at [facility], however, has been unable to get his medications refilled.  Modifying factors include taking tramadol which provides moderate relief of back pain.   The pt denies tingling and bowel problems associated with the back pain.   The pt is a current everyday smoker (0.2 packs/day), however, he does not drink alcohol.     Past Medical History  Diagnosis Date  . Thyroid disease   . Hypertension   . Anxiety   . Tachycardia - pulse   . Degenerative  disk disease   . Spinal stenosis   . Degenerative disk disease     History reviewed. No pertinent past surgical history.  Family History  Problem Relation Age of Onset  . Stroke Mother   . Hypertension Mother   . Stroke Father   . Hypertension Father     History  Substance Use Topics  . Smoking status: Current Every Day Smoker -- 0.2 packs/day    Types: Cigarettes  . Smokeless tobacco: Not on file  . Alcohol Use: No      Review of Systems  Musculoskeletal: Positive for back pain.  Neurological: Negative for tingling and weakness.  All other systems reviewed and are negative.    Allergies  Blueberry flavor; Penicillins; and Toradol  Home Medications   Current Outpatient Rx  Name Route Sig Dispense Refill  . CLONIDINE HCL 0.2 MG PO TABS Oral Take 1 tablet (0.2 mg total) by mouth 2 (two) times daily. 60 tablet 1  . IBUPROFEN 200 MG PO TABS Oral Take 600 mg by mouth every 6 (six) hours as needed. For pain    . LISINOPRIL 20 MG PO TABS Oral Take 1 tablet (20 mg total) by mouth daily. 30 tablet 0  . SALINE NASAL SPRAY 0.65 % NA SOLN Nasal Place 1 spray into the nose as needed. For stuffy nose    . TRAMADOL HCL 50 MG PO TABS Oral Take 50 mg by mouth every 4 (four) hours as needed. pain  BP 160/109  Pulse 88  Temp 98.9 F (37.2 C) (Oral)  Resp 20  SpO2 98%  Physical Exam  Nursing note and vitals reviewed. Constitutional: He is oriented to person, place, and time. He appears well-developed and well-nourished.       Pt is obese.   HENT:  Head: Atraumatic.  Nose: Nose normal.  Eyes: Conjunctivae normal and EOM are normal.  Neck: Normal range of motion.  Cardiovascular: Normal rate, regular rhythm and normal heart sounds.   Pulmonary/Chest: Effort normal and breath sounds normal.  Abdominal: Soft. Bowel sounds are normal.  Musculoskeletal: Normal range of motion. He exhibits tenderness.       Lumbar back: He exhibits tenderness and spasm.       Moderate  Lumbar tenderness detected, mild peri lumbar spasm.   Neurological: He is alert and oriented to person, place, and time.  Skin: Skin is warm and dry.  Psychiatric: He has a normal mood and affect. His behavior is normal.       ED Course  Procedures (including critical care time)  DIAGNOSTIC STUDIES: Oxygen Saturation is 98% on room air, normal by my interpretation.    COORDINATION OF CARE:   11:20 AM- Treatment plan concerning pain management discussed with patient. Pt agrees with treatment.     1. Back pain   2. Hypertension       MDM  Chronic back pain. Prior records are reviewed and he has multiple ED visits for back pain. Last MRI was done in 2008 and showed only mild to spinal stenosis but multiple areas of arthritis and disc degeneration. He states he has an appointment with a new PCP on November 1, so he is given a prescription for sufficient tramadol and clonidine and lisinopril to last until then.    I personally performed the services described in this documentation, which was scribed in my presence. The recorded information has been reviewed and considered.     Travis Booze, MD 12/22/11 1225

## 2012-01-04 ENCOUNTER — Emergency Department (HOSPITAL_COMMUNITY)
Admission: EM | Admit: 2012-01-04 | Discharge: 2012-01-04 | Disposition: A | Discharge status: Home / Self Care | Payer: Self-pay | Attending: Emergency Medicine | Admitting: Emergency Medicine

## 2012-01-04 ENCOUNTER — Encounter (HOSPITAL_COMMUNITY): Payer: Self-pay | Admitting: *Deleted

## 2012-01-04 ENCOUNTER — Emergency Department (HOSPITAL_COMMUNITY): Payer: Self-pay

## 2012-01-04 DIAGNOSIS — IMO0002 Reserved for concepts with insufficient information to code with codable children: Secondary | ICD-10-CM | POA: Insufficient documentation

## 2012-01-04 DIAGNOSIS — F411 Generalized anxiety disorder: Secondary | ICD-10-CM | POA: Insufficient documentation

## 2012-01-04 DIAGNOSIS — G8929 Other chronic pain: Secondary | ICD-10-CM | POA: Insufficient documentation

## 2012-01-04 DIAGNOSIS — M549 Dorsalgia, unspecified: Secondary | ICD-10-CM

## 2012-01-04 DIAGNOSIS — I10 Essential (primary) hypertension: Secondary | ICD-10-CM | POA: Insufficient documentation

## 2012-01-04 DIAGNOSIS — F17200 Nicotine dependence, unspecified, uncomplicated: Secondary | ICD-10-CM | POA: Insufficient documentation

## 2012-01-04 DIAGNOSIS — Z79899 Other long term (current) drug therapy: Secondary | ICD-10-CM | POA: Insufficient documentation

## 2012-01-04 DIAGNOSIS — Z791 Long term (current) use of non-steroidal anti-inflammatories (NSAID): Secondary | ICD-10-CM | POA: Insufficient documentation

## 2012-01-04 MED ORDER — TRAMADOL HCL 50 MG PO TABS
100.0000 mg | ORAL_TABLET | Freq: Once | ORAL | Status: AC
  Administered 2012-01-04: 100 mg via ORAL
  Filled 2012-01-04: qty 2

## 2012-01-04 MED ORDER — TRAMADOL HCL 50 MG PO TABS: 50.0000 mg | ORAL_TABLET | Freq: Four times a day (QID) | ORAL | Status: DC | PRN

## 2012-01-04 NOTE — ED Notes (Signed)
Reports in chronic lower/middle back pain radiates to left leg for 8 years.  Rated pain scale 7/10 on a good day. Seen @ [facility] ED 2 weeks ago had all prescriptions filled. Ultram 50mg  rx is out for the last 2 days. Stated he is suppose to take 50mg  QID however has been instructed to take 6 tablets doing this for 6 years.

## 2012-01-04 NOTE — ED Provider Notes (Signed)
History     CSN: [id number]  Arrival date & time 01/04/12  4098   First MD Initiated Contact with Patient 01/04/12 1030      Chief Complaint  Patient presents with  . Back Pain    (Consider location/radiation/quality/duration/timing/severity/associated sxs/prior treatment) HPI Patient reports he tripped and fell 2 days ago landing on his buttocks. Since the event he complains of low nonradiating back pain worse with movement or changing positions. No loss of bladder or bowel control no new weakness patient suffers from chronic left leg weakness which is unchanged. He also reports spasms in both arms which he's had for greater than 6 months, also unchanged no treatment prior to coming here. Has not had any pain medicine. Has run out of the Tramadol prescribed in the past. This is his tramadol works well for his chronic back pain   Past Medical History  Diagnosis Date  . Thyroid disease   . Hypertension   . Anxiety   . Tachycardia - pulse   . Degenerative disk disease   . Spinal stenosis   . Degenerative disk disease     History reviewed. No pertinent past surgical history.  Family History  Problem Relation Age of Onset  . Stroke Mother   . Hypertension Mother   . Stroke Father   . Hypertension Father   . Dementia Father   . Diabetes Father     History  Substance Use Topics  . Smoking status: Current Every Day Smoker -- 0.2 packs/day    Types: Cigarettes  . Smokeless tobacco: Not on file  . Alcohol Use: No      Review of Systems  Constitutional: Negative.   HENT: Negative.   Respiratory: Negative.   Cardiovascular: Negative.   Gastrointestinal: Negative.   Musculoskeletal: Positive for myalgias and back pain.       Walks with cane  Skin: Negative.   Neurological: Negative.   Hematological: Negative.   Psychiatric/Behavioral: Negative.   All other systems reviewed and are negative.    Allergies  Blueberry flavor; Penicillins; and Toradol  Home  Medications   Current Outpatient Rx  Name Route Sig Dispense Refill  . CLONIDINE HCL 0.2 MG PO TABS Oral Take 0.2 mg by mouth 2 (two) times daily.    . IBUPROFEN 200 MG PO TABS Oral Take 600 mg by mouth every 6 (six) hours as needed. For pain    . LISINOPRIL 20 MG PO TABS Oral Take 20 mg by mouth daily.    . TRAMADOL HCL 50 MG PO TABS Oral Take 50 mg by mouth every 4 (four) hours as needed. pain      BP 167/111  Pulse 102  Temp 98.7 F (37.1 C) (Oral)  Resp 16  SpO2 100%  Physical Exam  Nursing note and vitals reviewed. Constitutional: He appears well-developed and well-nourished. He appears distressed.       Appears mildly uncomfortable Glasgow Coma Score 15  HENT:  Head: Normocephalic and atraumatic.  Eyes: Conjunctivae normal are normal. Pupils are equal, round, and reactive to light.  Neck: Neck supple. No tracheal deviation present. No thyromegaly present.  Cardiovascular: Normal rate and regular rhythm.   No murmur heard. Pulmonary/Chest: Effort normal and breath sounds normal.  Abdominal: Soft. Bowel sounds are normal. He exhibits no distension. There is no tenderness.  Musculoskeletal: Normal range of motion. He exhibits tenderness. He exhibits no edema.       Pelvis stable nontender genitalia normal male spine is tender over lumbar  area  Neurological: He is alert. He has normal reflexes. No cranial nerve deficit. Coordination normal.       dtrs symmetric bilaterally knee to ankle jerk and biceps was bilaterally walks with cane, gait steady  Skin: Skin is warm and dry. No rash noted.  Psychiatric: He has a normal mood and affect. Judgment and thought content normal.    ED Course  Procedures (including critical care time)  Labs Reviewed - No data to display No results found.   No diagnosis found. Results for orders placed during the hospital encounter of 12/10/11  POCT I-STAT, CHEM 8      Component Value Range   Sodium 141  135 - 145 mEq/L   Potassium 3.4 (*)  3.5 - 5.1 mEq/L   Chloride 103  96 - 112 mEq/L   BUN 4 (*) 6 - 23 mg/dL   Creatinine, Ser 1.61  0.50 - 1.35 mg/dL   Glucose, Bld 096 (*) 70 - 99 mg/dL   Calcium, Ion 0.45 (*) 1.12 - 1.23 mmol/L   TCO2 25  0 - 100 mmol/L   Hemoglobin 17.3 (*) 13.0 - 17.0 g/dL   HCT 40.9  81.1 - 91.4 %   Dg Lumbar Spine Complete  01/04/2012  *RADIOLOGY REPORT*  Clinical Data: Fall.  Low back pain.  Degenerative disc disease and spinal stenosis.  LUMBAR SPINE - COMPLETE 4+ VIEW  Comparison: 07/10/2011  Findings: No evidence of acute fracture, spondylolysis, or spondylolisthesis.  Mild degenerative disc disease again seen at multiple levels in the upper lumbar and lower thoracic spine, without change.  No other significant bone abnormality identified.  IMPRESSION:  1.  No acute findings. 2.  Stable upper lumbar and lower thoracic degenerative disc disease.   Original Report Authenticated By: Myles Rosenthal, M.D.      20 p.m. pain improved after treatment with tramadol. X-rays reviewed by me MDM  Plan prescription tramadol Patient has list of numbers which he's going to get a new primary care physician. [facility] He has prescriptions for lisinopril and clonidine Diagnosis #1 chronic back pain #16fall #3 hypertension        Doug Sou, MD 01/04/12 1330

## 2012-01-04 NOTE — ED Notes (Signed)
Pt states that he has chronic back pain, but manages with home meds. However, pt fell three days ago and pain is exponentially worse, causing him to lose sleep and meds not working. Pt also reports new L leg weakness and spasms in both arms since fall.

## 2012-01-04 NOTE — ED Notes (Signed)
Patient transported to X-ray 

## 2012-01-10 ENCOUNTER — Encounter (HOSPITAL_COMMUNITY): Payer: Self-pay | Admitting: Emergency Medicine

## 2012-01-10 ENCOUNTER — Emergency Department (HOSPITAL_COMMUNITY)
Admission: EM | Admit: 2012-01-10 | Discharge: 2012-01-10 | Disposition: A | Discharge status: Home / Self Care | Payer: Self-pay | Attending: Emergency Medicine | Admitting: Emergency Medicine

## 2012-01-10 DIAGNOSIS — F17200 Nicotine dependence, unspecified, uncomplicated: Secondary | ICD-10-CM | POA: Insufficient documentation

## 2012-01-10 DIAGNOSIS — Z79899 Other long term (current) drug therapy: Secondary | ICD-10-CM | POA: Insufficient documentation

## 2012-01-10 DIAGNOSIS — M129 Arthropathy, unspecified: Secondary | ICD-10-CM | POA: Insufficient documentation

## 2012-01-10 DIAGNOSIS — I10 Essential (primary) hypertension: Secondary | ICD-10-CM | POA: Insufficient documentation

## 2012-01-10 DIAGNOSIS — Z8679 Personal history of other diseases of the circulatory system: Secondary | ICD-10-CM | POA: Insufficient documentation

## 2012-01-10 DIAGNOSIS — G8929 Other chronic pain: Secondary | ICD-10-CM

## 2012-01-10 DIAGNOSIS — F411 Generalized anxiety disorder: Secondary | ICD-10-CM | POA: Insufficient documentation

## 2012-01-10 DIAGNOSIS — M2550 Pain in unspecified joint: Secondary | ICD-10-CM

## 2012-01-10 DIAGNOSIS — IMO0002 Reserved for concepts with insufficient information to code with codable children: Secondary | ICD-10-CM | POA: Insufficient documentation

## 2012-01-10 MED ORDER — TRAMADOL HCL 50 MG PO TABS
100.0000 mg | ORAL_TABLET | Freq: Once | ORAL | Status: AC
  Administered 2012-01-10: 100 mg via ORAL
  Filled 2012-01-10: qty 2

## 2012-01-10 MED ORDER — TRAMADOL HCL 50 MG PO TABS: 50.0000 mg | ORAL_TABLET | Freq: Four times a day (QID) | ORAL | Status: DC | PRN

## 2012-01-10 MED ORDER — LISINOPRIL 20 MG PO TABS: 20.0000 mg | ORAL_TABLET | Freq: Every day | ORAL | Status: DC

## 2012-01-10 NOTE — ED Notes (Signed)
Per EMS, pt has had increasing joint pain for the past 3 days.  Pain in elbows new onset.  Pt states left lower leg weakness, new onset.
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
## 2012-01-10 NOTE — ED Provider Notes (Signed)
History     CSN: [id number]  Arrival date & time 01/10/12  0630   First MD Initiated Contact with Patient 01/10/12 0710      Chief Complaint  Patient presents with  . Arthritis    (Consider location/radiation/quality/duration/timing/severity/associated sxs/prior treatment) HPI Comments: Stella Encarnacion is a 46 y.o. Male who is here for generalized joint achiness. He has been here 5 times in the last 2 months for similar complaints. He continues to try to find a primary care Dr. he has his name on the list at several clinics, but been unable to be seen. He has successfully signed up for [employer], by phone. He is out of his tramadol. His pain is worse with movement. He had told the nurse, that he had abdominal pain, but tells me. His belly does not hurt. He came here by EMS. He denies recent fever, chills, nausea, vomiting, weakness, or dizziness. He's been taking his medicine. Patients as prescribed. He has received his blood pressure medicine  prescriptions, twice in the last month  In the emergency department. There are no other modifying factors.  Patient is a 46 y.o. male presenting with arthritis. The history is provided by the patient.  Arthritis    Past Medical History  Diagnosis Date  . Hypertension   . Anxiety   . Tachycardia - pulse   . Degenerative disk disease   . Spinal stenosis   . Degenerative disk disease     History reviewed. No pertinent past surgical history.  Family History  Problem Relation Age of Onset  . Stroke Mother   . Hypertension Mother   . Stroke Father   . Hypertension Father   . Dementia Father   . Diabetes Father     History  Substance Use Topics  . Smoking status: Current Every Day Smoker -- 0.2 packs/day    Types: Cigarettes  . Smokeless tobacco: Not on file  . Alcohol Use: No      Review of Systems  Musculoskeletal: Positive for arthritis.  All other systems reviewed and are negative.    Allergies  Blueberry flavor;  Penicillins; and Toradol  Home Medications   Current Outpatient Rx  Name  Route  Sig  Dispense  Refill  . CLONIDINE HCL 0.2 MG PO TABS   Oral   Take 0.2 mg by mouth 2 (two) times daily.         . IBUPROFEN 200 MG PO TABS   Oral   Take 600 mg by mouth every 6 (six) hours as needed. For pain         . TRAMADOL HCL 50 MG PO TABS   Oral   Take 50 mg by mouth every 4 (four) hours as needed. pain         . LISINOPRIL 20 MG PO TABS   Oral   Take 1 tablet (20 mg total) by mouth daily.   30 tablet   0   . TRAMADOL HCL 50 MG PO TABS   Oral   Take 1 tablet (50 mg total) by mouth every 6 (six) hours as needed for pain.   30 tablet   0     BP 158/102  Pulse 75  Temp 98.3 F (36.8 C) (Oral)  Resp 20  Ht 5\' 10"  (1.778 m)  Wt 285 lb (129.275 kg)  BMI 40.89 kg/m2  SpO2 97%  Physical Exam  Nursing note and vitals reviewed. Constitutional: He is oriented to person, place, and time. He appears well-developed  and well-nourished.  HENT:  Head: Normocephalic and atraumatic.  Right Ear: External ear normal.  Left Ear: External ear normal.  Eyes: Conjunctivae normal and EOM are normal. Pupils are equal, round, and reactive to light.  Neck: Normal range of motion and phonation normal. Neck supple.  Cardiovascular: Normal rate, regular rhythm, normal heart sounds and intact distal pulses.   Pulmonary/Chest: Effort normal and breath sounds normal. He exhibits no bony tenderness.  Abdominal: Soft. Normal appearance. There is no tenderness.  Musculoskeletal: Normal range of motion.       Mild right elbow tenderness to palpation, and decreased motion secondary to pain. No associated joint swelling. Mildly limited lumbar range of motion secondary to pain.  Neurological: He is alert and oriented to person, place, and time. He has normal strength. No cranial nerve deficit or sensory deficit. He exhibits normal muscle tone. Coordination normal.       Normal memory. Normal insight.  Skin:  Skin is warm, dry and intact.  Psychiatric: He has a normal mood and affect. His behavior is normal. Judgment and thought content normal.    ED Course  Procedures (including critical care time)   Nursing notes, applicable records and vitals reviewed.      1. Arthralgia   2. Chronic pain   3. Hypertension       MDM  Hypertension, persistent, despite taking prescribed doses of medication. Will increase lisinopril to 40 mg. Will refill tramadol for chronic pain. Patient is trying to get a PCP.  Doubt metabolic instability, serious bacterial infection or impending vascular collapse; the patient is stable for discharge.   Plan: Home Medications- increase Lisinopril; Home Treatments- rest; Recommended follow up- find PCP asap        Flint Melter, MD 01/10/12 [phone]

## 2012-01-21 ENCOUNTER — Ambulatory Visit (INDEPENDENT_AMBULATORY_CARE_PROVIDER_SITE_OTHER): Payer: Self-pay | Admitting: Family Medicine

## 2012-01-21 ENCOUNTER — Encounter: Payer: Self-pay | Admitting: Family Medicine

## 2012-01-21 VITALS — BP 147/108 | HR 120 | Temp 99.0°F | Ht 70.0 in | Wt 287.0 lb

## 2012-01-21 DIAGNOSIS — F17200 Nicotine dependence, unspecified, uncomplicated: Secondary | ICD-10-CM

## 2012-01-21 DIAGNOSIS — F329 Major depressive disorder, single episode, unspecified: Secondary | ICD-10-CM

## 2012-01-21 DIAGNOSIS — M545 Low back pain: Secondary | ICD-10-CM

## 2012-01-21 DIAGNOSIS — M48061 Spinal stenosis, lumbar region without neurogenic claudication: Secondary | ICD-10-CM

## 2012-01-21 DIAGNOSIS — F410 Panic disorder [episodic paroxysmal anxiety] without agoraphobia: Secondary | ICD-10-CM | POA: Insufficient documentation

## 2012-01-21 DIAGNOSIS — I10 Essential (primary) hypertension: Secondary | ICD-10-CM

## 2012-01-21 MED ORDER — TRAMADOL HCL 50 MG PO TABS: 50.0000 mg | ORAL_TABLET | ORAL | Status: DC | PRN

## 2012-01-21 MED ORDER — IBUPROFEN 600 MG PO TABS: 600.0000 mg | ORAL_TABLET | Freq: Four times a day (QID) | ORAL | Status: DC | PRN

## 2012-01-21 MED ORDER — LISINOPRIL 20 MG PO TABS: 20.0000 mg | ORAL_TABLET | Freq: Every day | ORAL | Status: DC

## 2012-01-21 MED ORDER — CLONIDINE HCL 0.2 MG PO TABS: 0.2000 mg | ORAL_TABLET | Freq: Two times a day (BID) | ORAL | Status: DC

## 2012-01-21 MED ORDER — LORAZEPAM 1 MG PO TABS: 1.0000 mg | ORAL_TABLET | Freq: Two times a day (BID) | ORAL | Status: DC | PRN

## 2012-01-21 NOTE — Assessment & Plan Note (Signed)
Trial of Ativan--has been on previously which worked in the past.  30 pills last 45 days.

## 2012-01-21 NOTE — Patient Instructions (Signed)
Anxiety and Panic Attacks Your caregiver has informed you that you are having an anxiety or panic attack. There may be many forms of this. Most of the time these attacks come suddenly and without warning. They come at any time of day, including periods of sleep, and at any time of life. They may be strong and unexplained. Although panic attacks are very scary, they are physically harmless. Sometimes the cause of your anxiety is not known. Anxiety is a protective mechanism of the body in its fight or flight mechanism. Most of these perceived danger situations are actually nonphysical situations (such as anxiety over losing a job). CAUSES  The causes of an anxiety or panic attack are many. Panic attacks may occur in otherwise healthy people given a certain set of circumstances. There may be a genetic cause for panic attacks. Some medications may also have anxiety as a side effect. SYMPTOMS  Some of the most common feelings are:  Intense terror.  Dizziness, feeling faint.  Hot and cold flashes.  Fear of going crazy.  Feelings that nothing is real.  Sweating.  Shaking.  Chest pain or a fast heartbeat (palpitations).  Smothering, choking sensations.  Feelings of impending doom and that death is near.  Tingling of extremities, this may be from over-breathing.  Altered reality (derealization).  Being detached from yourself (depersonalization). Several symptoms can be present to make up anxiety or panic attacks. DIAGNOSIS  The evaluation by your caregiver will depend on the type of symptoms you are experiencing. The diagnosis of anxiety or panic attack is made when no physical illness can be determined to be a cause of the symptoms. TREATMENT  Treatment to prevent anxiety and panic attacks may include:  Avoidance of circumstances that cause anxiety.  Reassurance and relaxation.  Regular exercise.  Relaxation therapies, such as yoga.  Psychotherapy with a psychiatrist or  therapist.  Avoidance of caffeine, alcohol and illegal drugs.  Prescribed medication. SEEK IMMEDIATE MEDICAL CARE IF:   You experience panic attack symptoms that are different than your usual symptoms.  You have any worsening or concerning symptoms. Document Released: 02/18/2005 Document Revised: 05/13/2011 Document Reviewed: 06/22/2009 [facility] Patient Information 2013 [place], [place]. Hypertension As your heart beats, it forces blood through your arteries. This force is your blood pressure. If the pressure is too high, it is called hypertension (HTN) or high blood pressure. HTN is dangerous because you may have it and not know it. High blood pressure may mean that your heart has to work harder to pump blood. Your arteries may be narrow or stiff. The extra work puts you at risk for heart disease, stroke, and other problems.  Blood pressure consists of two numbers, a higher number over a lower, 110/72, for example. It is stated as "110 over 72." The ideal is below 120 for the top number (systolic) and under 80 for the bottom (diastolic). Write down your blood pressure today. You should pay close attention to your blood pressure if you have certain conditions such as:  Heart failure.  Prior heart attack.  Diabetes  Chronic kidney disease.  Prior stroke.  Multiple risk factors for heart disease. To see if you have HTN, your blood pressure should be measured while you are seated with your arm held at the level of the heart. It should be measured at least twice. A one-time elevated blood pressure reading (especially in the Emergency Department) does not mean that you need treatment. There may be conditions in which the blood pressure  is different between your right and left arms. It is important to see your caregiver soon for a recheck. Most people have essential hypertension which means that there is not a specific cause. This type of high blood pressure may be lowered by changing lifestyle  factors such as:  Stress.  Smoking.  Lack of exercise.  Excessive weight.  Drug/tobacco/alcohol use.  Eating less salt. Most people do not have symptoms from high blood pressure until it has caused damage to the body. Effective treatment can often prevent, delay or reduce that damage. TREATMENT  When a cause has been identified, treatment for high blood pressure is directed at the cause. There are a large number of medications to treat HTN. These fall into several categories, and your caregiver will help you select the medicines that are best for you. Medications may have side effects. You should review side effects with your caregiver. If your blood pressure stays high after you have made lifestyle changes or started on medicines,   Your medication(s) may need to be changed.  Other problems may need to be addressed.  Be certain you understand your prescriptions, and know how and when to take your medicine.  Be sure to follow up with your caregiver within the time frame advised (usually within two weeks) to have your blood pressure rechecked and to review your medications.  If you are taking more than one medicine to lower your blood pressure, make sure you know how and at what times they should be taken. Taking two medicines at the same time can result in blood pressure that is too low. SEEK IMMEDIATE MEDICAL CARE IF:  You develop a severe headache, blurred or changing vision, or confusion.  You have unusual weakness or numbness, or a faint feeling.  You have severe chest or abdominal pain, vomiting, or breathing problems. MAKE SURE YOU:   Understand these instructions.  Will watch your condition.  Will get help right away if you are not doing well or get worse. Document Released: 02/18/2005 Document Revised: 05/13/2011 Document Reviewed: 10/09/2007 [facility] Patient Information 2013 [place], [place]. Smoking Cessation Quitting smoking is important to your health and has  many advantages. However, it is not always easy to quit since nicotine is a very addictive drug. Often times, people try 3 times or more before being able to quit. This document explains the best ways for you to prepare to quit smoking. Quitting takes hard work and a lot of effort, but you can do it. ADVANTAGES OF QUITTING SMOKING  You will live longer, feel better, and live better.  Your body will feel the impact of quitting smoking almost immediately.  Within 20 minutes, blood pressure decreases. Your pulse returns to its normal level.  After 8 hours, carbon monoxide levels in the blood return to normal. Your oxygen level increases.  After 24 hours, the chance of having a heart attack starts to decrease. Your breath, hair, and body stop smelling like smoke.  After 48 hours, damaged nerve endings begin to recover. Your sense of taste and smell improve.  After 72 hours, the body is virtually free of nicotine. Your bronchial tubes relax and breathing becomes easier.  After 2 to 12 weeks, lungs can hold more air. Exercise becomes easier and circulation improves.  The risk of having a heart attack, stroke, cancer, or lung disease is greatly reduced.  After 1 year, the risk of coronary heart disease is cut in half.  After 5 years, the risk of stroke falls  to the same as a nonsmoker.  After 10 years, the risk of lung cancer is cut in half and the risk of other cancers decreases significantly.  After 15 years, the risk of coronary heart disease drops, usually to the level of a nonsmoker.  If you are pregnant, quitting smoking will improve your chances of having a healthy baby.  The people you live with, especially any children, will be healthier.  You will have extra money to spend on things other than cigarettes. QUESTIONS TO THINK ABOUT BEFORE ATTEMPTING TO QUIT You may want to talk about your answers with your caregiver.  Why do you want to quit?  If you tried to quit in the past,  what helped and what did not?  What will be the most difficult situations for you after you quit? How will you plan to handle them?  Who can help you through the tough times? Your family? Friends? A caregiver?  What pleasures do you get from smoking? What ways can you still get pleasure if you quit? Here are some questions to ask your caregiver:  How can you help me to be successful at quitting?  What medicine do you think would be best for me and how should I take it?  What should I do if I need more help?  What is smoking withdrawal like? How can I get information on withdrawal? GET READY  Set a quit date.  Change your environment by getting rid of all cigarettes, ashtrays, matches, and lighters in your home, car, or work. Do not let people smoke in your home.  Review your past attempts to quit. Think about what worked and what did not. GET SUPPORT AND ENCOURAGEMENT You have a better chance of being successful if you have help. You can get support in many ways.  Tell your family, friends, and co-workers that you are going to quit and need their support. Ask them not to smoke around you.  Get individual, group, or telephone counseling and support. Programs are available at [employer] and health centers. Call your local health department for information about programs in your area.  Spiritual beliefs and practices may help some smokers quit.  Download a "quit meter" on your computer to keep track of quit statistics, such as how long you have gone without smoking, cigarettes not smoked, and money saved.  Get a self-help book about quitting smoking and staying off of tobacco. LEARN NEW SKILLS AND BEHAVIORS  Distract yourself from urges to smoke. Talk to someone, go for a walk, or occupy your time with a task.  Change your normal routine. Take a different route to work. Drink tea instead of coffee. Eat breakfast in a different place.  Reduce your stress. Take a hot bath,  exercise, or read a book.  Plan something enjoyable to do every day. Reward yourself for not smoking.  Explore interactive web-based programs that specialize in helping you quit. GET MEDICINE AND USE IT CORRECTLY Medicines can help you stop smoking and decrease the urge to smoke. Combining medicine with the above behavioral methods and support can greatly increase your chances of successfully quitting smoking.  Nicotine replacement therapy helps deliver nicotine to your body without the negative effects and risks of smoking. Nicotine replacement therapy includes nicotine gum, lozenges, inhalers, nasal sprays, and skin patches. Some may be available over-the-counter and others require a prescription.  Antidepressant medicine helps people abstain from smoking, but how this works is unknown. This medicine is available by  prescription.  Nicotinic receptor partial agonist medicine simulates the effect of nicotine in your brain. This medicine is available by prescription. Ask your caregiver for advice about which medicines to use and how to use them based on your health history. Your caregiver will tell you what side effects to look out for if you choose to be on a medicine or therapy. Carefully read the information on the package. Do not use any other product containing nicotine while using a nicotine replacement product.  RELAPSE OR DIFFICULT SITUATIONS Most relapses occur within the first 3 months after quitting. Do not be discouraged if you start smoking again. Remember, most people try several times before finally quitting. You may have symptoms of withdrawal because your body is used to nicotine. You may crave cigarettes, be irritable, feel very hungry, cough often, get headaches, or have difficulty concentrating. The withdrawal symptoms are only temporary. They are strongest when you first quit, but they will go away within 10 14 days. To reduce the chances of relapse, try to:  Avoid drinking  alcohol. Drinking lowers your chances of successfully quitting.  Reduce the amount of caffeine you consume. Once you quit smoking, the amount of caffeine in your body increases and can give you symptoms, such as a rapid heartbeat, sweating, and anxiety.  Avoid smokers because they can make you want to smoke.  Do not let weight gain distract you. Many smokers will gain weight when they quit, usually less than 10 pounds. Eat a healthy diet and stay active. You can always lose the weight gained after you quit.  Find ways to improve your mood other than smoking. FOR MORE INFORMATION  www.smokefree.gov  Document Released: 02/12/2001 Document Revised: 08/20/2011 Document Reviewed: 05/30/2011 [facility] Patient Information 2013 [place], [place].

## 2012-01-21 NOTE — Progress Notes (Signed)
  Subjective:    Patient ID: Travis [name], male    DOB: May 09, 1965, 46 y.o.   MRN: [id number]  HPI  New pt. transferred from [facility]  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  He is trying to get disability.  He has HTN and has been out of meds for several weeks.  Reports h/o degenerative disc disease and had MRI to prove this.  Has h/o panic attacks and palpitations. Reports arthritis in hip as well.  Review of MRI shows spinal stenosis, disc disease at L5-S1 and arthritic changes L>R. Past Medical History  Diagnosis Date  . Hypertension   . Anxiety   . Tachycardia - pulse   . Degenerative disk disease   . Spinal stenosis   . Degenerative disk disease    History reviewed. No pertinent past surgical history. Family History  Problem Relation Age of Onset  . Stroke Mother   . Hypertension Mother   . Stroke Father   . Hypertension Father   . Dementia Father   . Diabetes Father   . Heart disease Father   . Cancer Sister     brain  . Heart disease Brother    History   Social History  . Marital Status: Divorced    Spouse Name: N/A    Number of Children: N/A  . Years of Education: N/A   Occupational History  . Not on file.   Social History Main Topics  . Smoking status: Current Every Day Smoker -- 0.2 packs/day    Types: Cigarettes  . Smokeless tobacco: Not on file  . Alcohol Use: No  . Drug Use: No  . Sexually Active: No   Other Topics Concern  . Not on file   Social History Narrative  . No narrative on file   Current Outpatient Prescriptions on File Prior to Visit  Medication Sig Dispense Refill  . ibuprofen (ADVIL,MOTRIN) 800 MG tablet Take 1 tablet (800 mg total) by mouth 3 (three) times daily. Take 800mg  by mouth at breakfast, lunch and dinner for the next 5 days  21 tablet  0  . [DISCONTINUED] ibuprofen (ADVIL,MOTRIN) 200 MG tablet Take 600 mg by mouth every 6 (six) hours as needed. For pain       Allergies    Allergen Reactions  . Blueberry Flavor Hives  . Penicillins Hives, Itching and Other (See Comments)    Hallucinations.  . Toradol (Ketorolac Tromethamine) Other (See Comments)    Headache     Review of Systems  Constitutional: Negative for chills and fatigue.  Respiratory: Negative for shortness of breath.   Cardiovascular: Positive for palpitations. Negative for chest pain.  Genitourinary: Negative for dysuria.  Musculoskeletal: Positive for back pain and arthralgias.  Neurological: Positive for light-headedness.       Objective:   Physical Exam  Vitals reviewed. Constitutional: He is oriented to person, place, and time. He appears well-developed and well-nourished.  HENT:  Head: Normocephalic and atraumatic.  Eyes: No scleral icterus.  Neck: Neck supple.  Cardiovascular: Normal rate and regular rhythm.   No murmur heard. Pulmonary/Chest: Effort normal and breath sounds normal. He has no wheezes.  Abdominal: Soft. There is no tenderness.  Neurological: He is alert and oriented to person, place, and time.  Skin: Skin is warm and dry.  Psychiatric: He has a normal mood and affect. His behavior is normal.          Assessment & Plan:

## 2012-01-21 NOTE — Assessment & Plan Note (Signed)
To continue to work on smoking cessation.

## 2012-01-21 NOTE — Assessment & Plan Note (Addendum)
BP poorly controlled.  Re-start BP meds.  According to pt. He has been tried on multiple medications and clonidine works the best.

## 2012-01-21 NOTE — Assessment & Plan Note (Addendum)
Still with chronic pain--working on disability. He needs pain meds but nothing more than Ultram 120/mo.  Nothing stronger per pt. Wishes.

## 2012-01-21 NOTE — Assessment & Plan Note (Deleted)
Chronic pain--will refill Ultram 120/mo.  Nothing stronger per pt. Wishes.

## 2012-02-03 ENCOUNTER — Ambulatory Visit (INDEPENDENT_AMBULATORY_CARE_PROVIDER_SITE_OTHER): Payer: Self-pay | Admitting: Family Medicine

## 2012-02-03 ENCOUNTER — Encounter: Payer: Self-pay | Admitting: Family Medicine

## 2012-02-03 VITALS — BP 153/114 | HR 121 | Temp 98.7°F | Wt 287.0 lb

## 2012-02-03 DIAGNOSIS — M48061 Spinal stenosis, lumbar region without neurogenic claudication: Secondary | ICD-10-CM

## 2012-02-03 DIAGNOSIS — M545 Low back pain: Secondary | ICD-10-CM

## 2012-02-03 DIAGNOSIS — I10 Essential (primary) hypertension: Secondary | ICD-10-CM

## 2012-02-03 MED ORDER — TRAMADOL HCL 50 MG PO TABS: 50.0000 mg | ORAL_TABLET | ORAL | Status: DC | PRN

## 2012-02-03 MED ORDER — CLONIDINE HCL 0.2 MG PO TABS: 0.2000 mg | ORAL_TABLET | Freq: Two times a day (BID) | ORAL | Status: DC

## 2012-02-03 NOTE — Patient Instructions (Signed)
Smoking Cessation Quitting smoking is important to your health and has many advantages. However, it is not always easy to quit since nicotine is a very addictive drug. Often times, people try 3 times or more before being able to quit. This document explains the best ways for you to prepare to quit smoking. Quitting takes hard work and a lot of effort, but you can do it. ADVANTAGES OF QUITTING SMOKING  You will live longer, feel better, and live better.  Your body will feel the impact of quitting smoking almost immediately.  Within 20 minutes, blood pressure decreases. Your pulse returns to its normal level.  After 8 hours, carbon monoxide levels in the blood return to normal. Your oxygen level increases.  After 24 hours, the chance of having a heart attack starts to decrease. Your breath, hair, and body stop smelling like smoke.  After 48 hours, damaged nerve endings begin to recover. Your sense of taste and smell improve.  After 72 hours, the body is virtually free of nicotine. Your bronchial tubes relax and breathing becomes easier.  After 2 to 12 weeks, lungs can hold more air. Exercise becomes easier and circulation improves.  The risk of having a heart attack, stroke, cancer, or lung disease is greatly reduced.  After 1 year, the risk of coronary heart disease is cut in half.  After 5 years, the risk of stroke falls to the same as a nonsmoker.  After 10 years, the risk of lung cancer is cut in half and the risk of other cancers decreases significantly.  After 15 years, the risk of coronary heart disease drops, usually to the level of a nonsmoker.  If you are pregnant, quitting smoking will improve your chances of having a healthy baby.  The people you live with, especially any children, will be healthier.  You will have extra money to spend on things other than cigarettes. QUESTIONS TO THINK ABOUT BEFORE ATTEMPTING TO QUIT You may want to talk about your answers with your  caregiver.  Why do you want to quit?  If you tried to quit in the past, what helped and what did not?  What will be the most difficult situations for you after you quit? How will you plan to handle them?  Who can help you through the tough times? Your family? Friends? A caregiver?  What pleasures do you get from smoking? What ways can you still get pleasure if you quit? Here are some questions to ask your caregiver:  How can you help me to be successful at quitting?  What medicine do you think would be best for me and how should I take it?  What should I do if I need more help?  What is smoking withdrawal like? How can I get information on withdrawal? GET READY  Set a quit date.  Change your environment by getting rid of all cigarettes, ashtrays, matches, and lighters in your home, car, or work. Do not let people smoke in your home.  Review your past attempts to quit. Think about what worked and what did not. GET SUPPORT AND ENCOURAGEMENT You have a better chance of being successful if you have help. You can get support in many ways.  Tell your family, friends, and co-workers that you are going to quit and need their support. Ask them not to smoke around you.  Get individual, group, or telephone counseling and support. Programs are available at local hospitals and health centers. Call your local health department for   information about programs in your area.  Spiritual beliefs and practices may help some smokers quit.  Download a "quit meter" on your computer to keep track of quit statistics, such as how long you have gone without smoking, cigarettes not smoked, and money saved.  Get a self-help book about quitting smoking and staying off of tobacco. LEARN NEW SKILLS AND BEHAVIORS  Distract yourself from urges to smoke. Talk to someone, go for a walk, or occupy your time with a task.  Change your normal routine. Take a different route to work. Drink tea instead of coffee.  Eat breakfast in a different place.  Reduce your stress. Take a hot bath, exercise, or read a book.  Plan something enjoyable to do every day. Reward yourself for not smoking.  Explore interactive web-based programs that specialize in helping you quit. GET MEDICINE AND USE IT CORRECTLY Medicines can help you stop smoking and decrease the urge to smoke. Combining medicine with the above behavioral methods and support can greatly increase your chances of successfully quitting smoking.  Nicotine replacement therapy helps deliver nicotine to your body without the negative effects and risks of smoking. Nicotine replacement therapy includes nicotine gum, lozenges, inhalers, nasal sprays, and skin patches. Some may be available over-the-counter and others require a prescription.  Antidepressant medicine helps people abstain from smoking, but how this works is unknown. This medicine is available by prescription.  Nicotinic receptor partial agonist medicine simulates the effect of nicotine in your brain. This medicine is available by prescription. Ask your caregiver for advice about which medicines to use and how to use them based on your health history. Your caregiver will tell you what side effects to look out for if you choose to be on a medicine or therapy. Carefully read the information on the package. Do not use any other product containing nicotine while using a nicotine replacement product.  RELAPSE OR DIFFICULT SITUATIONS Most relapses occur within the first 3 months after quitting. Do not be discouraged if you start smoking again. Remember, most people try several times before finally quitting. You may have symptoms of withdrawal because your body is used to nicotine. You may crave cigarettes, be irritable, feel very hungry, cough often, get headaches, or have difficulty concentrating. The withdrawal symptoms are only temporary. They are strongest when you first quit, but they will go away within  10 14 days. To reduce the chances of relapse, try to:  Avoid drinking alcohol. Drinking lowers your chances of successfully quitting.  Reduce the amount of caffeine you consume. Once you quit smoking, the amount of caffeine in your body increases and can give you symptoms, such as a rapid heartbeat, sweating, and anxiety.  Avoid smokers because they can make you want to smoke.  Do not let weight gain distract you. Many smokers will gain weight when they quit, usually less than 10 pounds. Eat a healthy diet and stay active. You can always lose the weight gained after you quit.  Find ways to improve your mood other than smoking. FOR MORE INFORMATION  www.smokefree.gov  Document Released: 02/12/2001 Document Revised: 08/20/2011 Document Reviewed: 05/30/2011 [employer] Patient Information 2013 [employer]. Hypertension As your heart beats, it forces blood through your arteries. This force is your blood pressure. If the pressure is too high, it is called hypertension (HTN) or high blood pressure. HTN is dangerous because you may have it and not know it. High blood pressure may mean that your heart has to work harder to pump   blood. Your arteries may be narrow or stiff. The extra work puts you at risk for heart disease, stroke, and other problems.  Blood pressure consists of two numbers, a higher number over a lower, 110/72, for example. It is stated as "110 over 72." The ideal is below 120 for the top number (systolic) and under 80 for the bottom (diastolic). Write down your blood pressure today. You should pay close attention to your blood pressure if you have certain conditions such as:  Heart failure.  Prior heart attack.  Diabetes  Chronic kidney disease.  Prior stroke.  Multiple risk factors for heart disease. To see if you have HTN, your blood pressure should be measured while you are seated with your arm held at the level of the heart. It should be measured at least twice. A  one-time elevated blood pressure reading (especially in the Emergency Department) does not mean that you need treatment. There may be conditions in which the blood pressure is different between your right and left arms. It is important to see your caregiver soon for a recheck. Most people have essential hypertension which means that there is not a specific cause. This type of high blood pressure may be lowered by changing lifestyle factors such as:  Stress.  Smoking.  Lack of exercise.  Excessive weight.  Drug/tobacco/alcohol use.  Eating less salt. Most people do not have symptoms from high blood pressure until it has caused damage to the body. Effective treatment can often prevent, delay or reduce that damage. TREATMENT  When a cause has been identified, treatment for high blood pressure is directed at the cause. There are a large number of medications to treat HTN. These fall into several categories, and your caregiver will help you select the medicines that are best for you. Medications may have side effects. You should review side effects with your caregiver. If your blood pressure stays high after you have made lifestyle changes or started on medicines,   Your medication(s) may need to be changed.  Other problems may need to be addressed.  Be certain you understand your prescriptions, and know how and when to take your medicine.  Be sure to follow up with your caregiver within the time frame advised (usually within two weeks) to have your blood pressure rechecked and to review your medications.  If you are taking more than one medicine to lower your blood pressure, make sure you know how and at what times they should be taken. Taking two medicines at the same time can result in blood pressure that is too low. SEEK IMMEDIATE MEDICAL CARE IF:  You develop a severe headache, blurred or changing vision, or confusion.  You have unusual weakness or numbness, or a faint feeling.  You  have severe chest or abdominal pain, vomiting, or breathing problems. MAKE SURE YOU:   Understand these instructions.  Will watch your condition.  Will get help right away if you are not doing well or get worse. Document Released: 02/18/2005 Document Revised: 05/13/2011 Document Reviewed: 10/09/2007 [employer] Patient Information 2013 [employer].  

## 2012-02-03 NOTE — Progress Notes (Signed)
  Subjective:    Patient ID: Travis [name], male    DOB: 02-25-1966, 46 y.o.   MRN: [id number]  HPI Here today because he lost his medication while visiting over [place]. Did manage to get his lispinopril and his lorazepam filled.  Needs Clonidine and Ultram until able to get refill in 2 wks.  No other new issues.   Review of Systems  Constitutional: Negative for fever and chills.  HENT: Negative for rhinorrhea and sneezing.   Respiratory: Negative for shortness of breath.   Cardiovascular: Negative for chest pain.  Gastrointestinal: Negative for nausea, vomiting and abdominal pain.       Objective:   Physical Exam  Vitals reviewed. Constitutional: He appears well-developed and well-nourished.  HENT:  Head: Normocephalic and atraumatic.  Eyes: No scleral icterus.  Cardiovascular: Normal rate.   Pulmonary/Chest: Effort normal.  Abdominal: Soft.  Neurological:       Tremulous          Assessment & Plan:  Flu and TDaP and pneumonia shot next visit.

## 2012-02-03 NOTE — Assessment & Plan Note (Signed)
Rebound HTN secondary to not being on medication--will give 2 wk supply.

## 2012-02-03 NOTE — Assessment & Plan Note (Signed)
2 wk supply of Ultram given.  Minimal use of Ibuprofen with food prn.

## 2012-02-11 ENCOUNTER — Telehealth: Payer: Self-pay | Admitting: Family Medicine

## 2012-02-11 NOTE — Telephone Encounter (Signed)
Patient is asking to speak to the nurse about his blood pressure being high.  It is currently 172/124.

## 2012-02-11 NOTE — Telephone Encounter (Signed)
Patient states he is taking meds as directed.  States at last visit Dr. Shawnie Pons told him to increase lisinopril to 20 mg twice daily and return for lab visit to make sure kidneys are doing OK on this dose. I am unable to see this  notation in chart about this however.  States he fell flat on his back yesterday and is having much pain in back in addition to BP elevation. Advised patient to go to ED to be evaluated today  Regarding BP eleavtion and back injury.. We do not have any available appointment left today.Travis [name] He voices understanding.

## 2012-02-19 ENCOUNTER — Ambulatory Visit: Payer: Self-pay | Admitting: Family Medicine

## 2012-02-20 ENCOUNTER — Ambulatory Visit (INDEPENDENT_AMBULATORY_CARE_PROVIDER_SITE_OTHER): Payer: Self-pay | Admitting: Family Medicine

## 2012-02-20 ENCOUNTER — Encounter: Payer: Self-pay | Admitting: Family Medicine

## 2012-02-20 VITALS — BP 149/109 | HR 101 | Ht 71.0 in | Wt 297.0 lb

## 2012-02-20 DIAGNOSIS — I10 Essential (primary) hypertension: Secondary | ICD-10-CM

## 2012-02-20 DIAGNOSIS — Z23 Encounter for immunization: Secondary | ICD-10-CM

## 2012-02-20 DIAGNOSIS — F410 Panic disorder [episodic paroxysmal anxiety] without agoraphobia: Secondary | ICD-10-CM

## 2012-02-20 LAB — COMPREHENSIVE METABOLIC PANEL
AST: 22 U/L (ref 0–37)
Albumin: 4.1 g/dL (ref 3.5–5.2)
Alkaline Phosphatase: 80 U/L (ref 39–117)
BUN: 6 mg/dL (ref 6–23)
Creat: 0.9 mg/dL (ref 0.50–1.35)
Potassium: 4.2 mEq/L (ref 3.5–5.3)
Total Bilirubin: 0.6 mg/dL (ref 0.3–1.2)

## 2012-02-20 MED ORDER — LORAZEPAM 1 MG PO TABS: 1.0000 mg | ORAL_TABLET | Freq: Two times a day (BID) | ORAL | Status: DC | PRN

## 2012-02-20 MED ORDER — TETANUS-DIPHTH-ACELL PERTUSSIS 5-2.5-18.5 LF-MCG/0.5 IM SUSP: 0.5000 mL | Freq: Once | INTRAMUSCULAR | Status: DC

## 2012-02-20 MED ORDER — LISINOPRIL 20 MG PO TABS: 20.0000 mg | ORAL_TABLET | Freq: Every day | ORAL | Status: DC

## 2012-02-20 NOTE — Patient Instructions (Addendum)

## 2012-02-24 ENCOUNTER — Encounter: Payer: Self-pay | Admitting: Family Medicine

## 2012-02-24 NOTE — Assessment & Plan Note (Signed)
Somewhat improved

## 2012-02-24 NOTE — Assessment & Plan Note (Signed)
May need additional agent if BP remains high.

## 2012-02-24 NOTE — Progress Notes (Signed)
  Subjective:    Patient ID: Travis [name], male    DOB: 1965-03-30, 46 y.o.   MRN: [id number]  HPI  Back for f/u.  Needs vaccination.  Then, reports a blister type outbreak on the chin.  He reports having this type of outbreak in other areas of his face.  It is self-limited and tends to go away. He is otherwise doing well.  Review of Systems  Constitutional: Negative for fever and chills.  HENT: Negative for congestion and rhinorrhea.   Respiratory: Negative for chest tightness and shortness of breath.   Cardiovascular: Negative for chest pain and leg swelling.  Gastrointestinal: Negative for nausea, vomiting, abdominal pain, diarrhea and constipation.  Genitourinary: Negative for dysuria and hematuria.  Neurological: Negative for headaches.       Objective:   Physical Exam  Vitals reviewed. Constitutional: He appears well-developed and well-nourished. No distress.  HENT:  Head: Normocephalic and atraumatic.       Healing blister noted on chin  Eyes: No scleral icterus.  Cardiovascular: Normal rate.   Pulmonary/Chest: Effort normal.  Abdominal: There is no tenderness.  Musculoskeletal: Normal range of motion.  Neurological: He is alert.  Skin: Skin is warm and dry.  Psychiatric: He has a normal mood and affect.          Assessment & Plan:

## 2012-03-20 ENCOUNTER — Other Ambulatory Visit: Payer: Self-pay | Admitting: *Deleted

## 2012-03-20 DIAGNOSIS — M48061 Spinal stenosis, lumbar region without neurogenic claudication: Secondary | ICD-10-CM

## 2012-03-20 MED ORDER — TRAMADOL HCL 50 MG PO TABS: 50.0000 mg | ORAL_TABLET | ORAL | Status: DC | PRN

## 2012-03-24 ENCOUNTER — Other Ambulatory Visit: Payer: Self-pay | Admitting: Family Medicine

## 2012-03-30 ENCOUNTER — Emergency Department (HOSPITAL_COMMUNITY): Payer: Self-pay

## 2012-03-30 ENCOUNTER — Encounter (HOSPITAL_COMMUNITY): Payer: Self-pay | Admitting: Emergency Medicine

## 2012-03-30 ENCOUNTER — Emergency Department (HOSPITAL_COMMUNITY)
Admission: EM | Admit: 2012-03-30 | Discharge: 2012-03-30 | Disposition: A | Discharge status: Home / Self Care | Payer: Self-pay | Attending: Emergency Medicine | Admitting: Emergency Medicine

## 2012-03-30 DIAGNOSIS — R000 Tachycardia, unspecified: Secondary | ICD-10-CM | POA: Insufficient documentation

## 2012-03-30 DIAGNOSIS — W19XXXA Unspecified fall, initial encounter: Secondary | ICD-10-CM

## 2012-03-30 DIAGNOSIS — G8929 Other chronic pain: Secondary | ICD-10-CM | POA: Insufficient documentation

## 2012-03-30 DIAGNOSIS — Y92009 Unspecified place in unspecified non-institutional (private) residence as the place of occurrence of the external cause: Secondary | ICD-10-CM | POA: Insufficient documentation

## 2012-03-30 DIAGNOSIS — I10 Essential (primary) hypertension: Secondary | ICD-10-CM | POA: Insufficient documentation

## 2012-03-30 DIAGNOSIS — M4800 Spinal stenosis, site unspecified: Secondary | ICD-10-CM | POA: Insufficient documentation

## 2012-03-30 DIAGNOSIS — F17200 Nicotine dependence, unspecified, uncomplicated: Secondary | ICD-10-CM | POA: Insufficient documentation

## 2012-03-30 DIAGNOSIS — F411 Generalized anxiety disorder: Secondary | ICD-10-CM | POA: Insufficient documentation

## 2012-03-30 DIAGNOSIS — IMO0002 Reserved for concepts with insufficient information to code with codable children: Secondary | ICD-10-CM | POA: Insufficient documentation

## 2012-03-30 DIAGNOSIS — S20229A Contusion of unspecified back wall of thorax, initial encounter: Secondary | ICD-10-CM | POA: Insufficient documentation

## 2012-03-30 DIAGNOSIS — W108XXA Fall (on) (from) other stairs and steps, initial encounter: Secondary | ICD-10-CM | POA: Insufficient documentation

## 2012-03-30 DIAGNOSIS — Z79899 Other long term (current) drug therapy: Secondary | ICD-10-CM | POA: Insufficient documentation

## 2012-03-30 DIAGNOSIS — S300XXA Contusion of lower back and pelvis, initial encounter: Secondary | ICD-10-CM

## 2012-03-30 DIAGNOSIS — Y9389 Activity, other specified: Secondary | ICD-10-CM | POA: Insufficient documentation

## 2012-03-30 MED ORDER — CLONIDINE HCL 0.2 MG PO TABS: 0.2000 mg | ORAL_TABLET | Freq: Two times a day (BID) | ORAL | Status: DC

## 2012-03-30 MED ORDER — MORPHINE SULFATE 4 MG/ML IJ SOLN
8.0000 mg | Freq: Once | INTRAMUSCULAR | Status: AC
  Administered 2012-03-30: 8 mg via INTRAMUSCULAR
  Filled 2012-03-30: qty 2

## 2012-03-30 MED ORDER — HYDROCODONE-ACETAMINOPHEN 5-325 MG PO TABS: 2.0000 | ORAL_TABLET | Freq: Four times a day (QID) | ORAL | Status: DC | PRN

## 2012-03-30 NOTE — ED Notes (Signed)
JWJ:[id number]<NW> Expected date:03/30/12<BR> Expected time: 9:00 AM<BR> Means of arrival:Ambulance<BR> Comments:<BR> Lower back pain from a fall yesterday

## 2012-03-30 NOTE — ED Provider Notes (Addendum)
History     CSN: [id number]  Arrival date & time 03/30/12  4098   First MD Initiated Contact with Patient 03/30/12 [phone]      Chief Complaint  Patient presents with  . Fall  . Back Pain    (Consider location/radiation/quality/duration/timing/severity/associated sxs/prior treatment) HPI Comments: Patient with history of chronic low back pain due to spinal stenosis and degenerative disc disease.  Presents with low back pain after a fall at home.  He says that he fell on the stairs and landed on his lower back.  There are no bowel or bladder complaints and he denies and radiation to the legs or weakness in the legs.  "Tramadol not touching the pain".    Patient is a 47 y.o. male presenting with fall and back pain. The history is provided by the patient.  Fall Incident onset: last night. The fall occurred while walking. Impact surface: steps. There was no blood loss. Point of impact: low back. Pain location: low back. The pain is severe. He was ambulatory at the scene. The symptoms are aggravated by activity, standing, pressure on the injury and sitting.  Back Pain     Past Medical History  Diagnosis Date  . Hypertension   . Anxiety   . Tachycardia - pulse   . Degenerative disk disease   . Spinal stenosis   . Degenerative disk disease     History reviewed. No pertinent past surgical history.  Family History  Problem Relation Age of Onset  . Stroke Mother   . Hypertension Mother   . Stroke Father   . Hypertension Father   . Dementia Father   . Diabetes Father   . Heart disease Father   . Cancer Sister     brain  . Heart disease Brother     History  Substance Use Topics  . Smoking status: Current Every Day Smoker -- 0.2 packs/day    Types: Cigarettes  . Smokeless tobacco: Not on file  . Alcohol Use: No      Review of Systems  Musculoskeletal: Positive for back pain.  All other systems reviewed and are negative.    Allergies  Blueberry flavor; Penicillins;  and Toradol  Home Medications   Current Outpatient Rx  Name  Route  Sig  Dispense  Refill  . CLONIDINE HCL 0.2 MG PO TABS   Oral   Take 1 tablet (0.2 mg total) by mouth 2 (two) times daily.   30 tablet   0   . IBUPROFEN 600 MG PO TABS   Oral   Take 1 tablet (600 mg total) by mouth every 6 (six) hours as needed for pain. For pain   60 tablet   3   . IBUPROFEN 800 MG PO TABS   Oral   Take 1 tablet (800 mg total) by mouth 3 (three) times daily. Take 800mg  by mouth at breakfast, lunch and dinner for the next 5 days   21 tablet   0   . LISINOPRIL 20 MG PO TABS   Oral   Take 1 tablet (20 mg total) by mouth daily.   30 tablet   3   . LORAZEPAM 1 MG PO TABS   Oral   Take 1 tablet (1 mg total) by mouth 2 (two) times daily as needed for anxiety.   45 tablet   2   . TRAMADOL HCL 50 MG PO TABS      TAKE ONE TABLET BY MOUTH EVERY 4 HOURS AS NEEDED  FOR PAIN   60 tablet   0     BP 149/100  Pulse 120  Temp 98 F (36.7 C)  Resp 20  SpO2 99%  Physical Exam  Nursing note and vitals reviewed. Constitutional: He is oriented to person, place, and time. He appears well-developed and well-nourished.  HENT:  Head: Normocephalic and atraumatic.  Neck: Normal range of motion. Neck supple.  Cardiovascular: Regular rhythm.        Tachycardic.  Pulmonary/Chest: Effort normal and breath sounds normal. No respiratory distress. He has no wheezes.  Musculoskeletal:       There is ttp in the lumbar region.  There is no bony ttp or stepoffs.    Neurological: He is alert and oriented to person, place, and time.       DTR's are trace and equal in the ble.  Strength is 5/5 in the ble.  Skin: Skin is warm.    ED Course  Procedures (including critical care time)  Labs Reviewed - No data to display No results found.   No diagnosis found.   Date: 03/30/2012  Rate: 127  Rhythm: sinus tachycardia  QRS Axis: normal  Intervals: normal  ST/T Wave abnormalities: normal  Conduction  Disutrbances:none  Narrative Interpretation:   Old EKG Reviewed: unchanged    MDM  The patient was given morphine IM for his pain.  The xrays do not reveal a fracture or misalignment.  There is no evidence for an emergent problem in the history or exam.  He will be given a prescription for norco and have requested that he gets future prescriptions from his pcp.  I will also refill his clonidine at his request.        Geoffery Lyons, MD 03/30/12 1111  Geoffery Lyons, MD 03/30/12 1112

## 2012-03-30 NOTE — ED Notes (Signed)
Per EMS pt came from home where he fall down stairs yesterday, been in severe pain in lower thoracic/lumbar area ever since.  Has PMH left leg pain and spinal stenosis.

## 2012-04-01 ENCOUNTER — Other Ambulatory Visit: Payer: Self-pay | Admitting: Family Medicine

## 2012-04-01 ENCOUNTER — Other Ambulatory Visit: Payer: Self-pay | Admitting: *Deleted

## 2012-04-02 MED ORDER — TRAMADOL HCL 50 MG PO TABS: 100.0000 mg | ORAL_TABLET | Freq: Four times a day (QID) | ORAL | Status: DC | PRN

## 2012-04-07 ENCOUNTER — Telehealth: Payer: Self-pay | Admitting: Family Medicine

## 2012-04-07 NOTE — Telephone Encounter (Signed)
Will forward to PCP 
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
## 2012-04-16 ENCOUNTER — Other Ambulatory Visit: Payer: Self-pay | Admitting: Family Medicine

## 2012-04-19 ENCOUNTER — Encounter (HOSPITAL_COMMUNITY): Payer: Self-pay | Admitting: *Deleted

## 2012-04-19 ENCOUNTER — Emergency Department (HOSPITAL_COMMUNITY)
Admission: EM | Admit: 2012-04-19 | Discharge: 2012-04-19 | Disposition: A | Discharge status: Home / Self Care | Payer: Self-pay | Attending: Emergency Medicine | Admitting: Emergency Medicine

## 2012-04-19 ENCOUNTER — Telehealth: Payer: Self-pay | Admitting: Sports Medicine

## 2012-04-19 DIAGNOSIS — Z8679 Personal history of other diseases of the circulatory system: Secondary | ICD-10-CM | POA: Insufficient documentation

## 2012-04-19 DIAGNOSIS — F17200 Nicotine dependence, unspecified, uncomplicated: Secondary | ICD-10-CM | POA: Insufficient documentation

## 2012-04-19 DIAGNOSIS — I10 Essential (primary) hypertension: Secondary | ICD-10-CM | POA: Insufficient documentation

## 2012-04-19 DIAGNOSIS — M545 Low back pain, unspecified: Secondary | ICD-10-CM | POA: Insufficient documentation

## 2012-04-19 DIAGNOSIS — F411 Generalized anxiety disorder: Secondary | ICD-10-CM | POA: Insufficient documentation

## 2012-04-19 DIAGNOSIS — M5430 Sciatica, unspecified side: Secondary | ICD-10-CM | POA: Insufficient documentation

## 2012-04-19 DIAGNOSIS — G8929 Other chronic pain: Secondary | ICD-10-CM | POA: Insufficient documentation

## 2012-04-19 DIAGNOSIS — Z79899 Other long term (current) drug therapy: Secondary | ICD-10-CM | POA: Insufficient documentation

## 2012-04-19 DIAGNOSIS — Z8739 Personal history of other diseases of the musculoskeletal system and connective tissue: Secondary | ICD-10-CM | POA: Insufficient documentation

## 2012-04-19 MED ORDER — METHOCARBAMOL 750 MG PO TABS: 750.0000 mg | ORAL_TABLET | Freq: Four times a day (QID) | ORAL | Status: DC | PRN

## 2012-04-19 MED ORDER — HYDROCODONE-ACETAMINOPHEN 5-325 MG PO TABS
2.0000 | ORAL_TABLET | Freq: Once | ORAL | Status: AC
  Administered 2012-04-19: 2 via ORAL
  Filled 2012-04-19: qty 2

## 2012-04-19 NOTE — ED Notes (Signed)
Pt is out of pain meds for chronic back pain x 2 days and began having pain last night.

## 2012-04-19 NOTE — ED Provider Notes (Signed)
History    This chart was scribed for non-physician practitioner working with Travis Most B. Bernette Mayers, MD by Donne Anon, ED Scribe. This patient was seen in room WTR8/WTR8 and the patient's care was started at 2234.   CSN: [id number]  Arrival date & time 04/19/12  2057   First MD Initiated Contact with Patient 04/19/12 2234      Chief Complaint  Patient presents with  . Back Pain     The history is provided by the patient and medical records. No language interpreter was used.   Travis [name] is a 47 y.o. male brought in by ambulance, who presents to the Emergency Department complaining of gradual onset, constant, chronic  lower back pain which radiates down his left leg with the most recent episode beginning yesterday. He states the pain is worse with movement. He denies any other pain. He took Tramadol yesterday with no relief. He states he is out of his pain medication (Tramadol) for his back pain. He states he has been out of work for 10 years on disability. He has a h/o of spinal cenosis degenorative disc disease and HTN.   Past Medical History  Diagnosis Date  . Hypertension   . Anxiety   . Tachycardia - pulse   . Degenerative disk disease   . Spinal stenosis   . Degenerative disk disease     History reviewed. No pertinent past surgical history.  Family History  Problem Relation Age of Onset  . Stroke Mother   . Hypertension Mother   . Stroke Father   . Hypertension Father   . Dementia Father   . Diabetes Father   . Heart disease Father   . Cancer Sister     brain  . Heart disease Brother     History  Substance Use Topics  . Smoking status: Current Every Day Smoker -- 0.25 packs/day    Types: Cigarettes  . Smokeless tobacco: Not on file  . Alcohol Use: No      Review of Systems  Constitutional: Negative for fever and fatigue.  HENT: Negative for neck pain and neck stiffness.   Respiratory: Negative for chest tightness and shortness of breath.    Cardiovascular: Negative for chest pain.  Gastrointestinal: Negative for nausea, vomiting, abdominal pain and diarrhea.  Genitourinary: Negative for dysuria, urgency, frequency and hematuria.  Musculoskeletal: Positive for back pain and gait problem ( 2/2 pain). Negative for joint swelling.  Skin: Negative for rash.  Neurological: Negative for weakness, light-headedness, numbness and headaches.  All other systems reviewed and are negative.    Allergies  Blueberry flavor; Penicillins; and Toradol  Home Medications   Current Outpatient Rx  Name  Route  Sig  Dispense  Refill  . cloNIDine (CATAPRES) 0.2 MG tablet   Oral   Take 1 tablet (0.2 mg total) by mouth 2 (two) times daily.   30 tablet   0   . ibuprofen (ADVIL,MOTRIN) 600 MG tablet   Oral   Take 1 tablet (600 mg total) by mouth every 6 (six) hours as needed for pain. For pain   60 tablet   3   . lisinopril (PRINIVIL,ZESTRIL) 20 MG tablet   Oral   Take 1 tablet (20 mg total) by mouth daily.   30 tablet   3   . LORazepam (ATIVAN) 1 MG tablet   Oral   Take 1 tablet (1 mg total) by mouth 2 (two) times daily as needed for anxiety.   45 tablet  2   . ranitidine (ZANTAC) 150 MG tablet   Oral   Take 150 mg by mouth 2 (two) times daily.         . traMADol (ULTRAM) 50 MG tablet   Oral   Take 2 tablets (100 mg total) by mouth every 6 (six) hours as needed for pain.   60 tablet   2   . EXPIRED: ibuprofen (ADVIL,MOTRIN) 800 MG tablet   Oral   Take 1 tablet (800 mg total) by mouth 3 (three) times daily. Take 800mg  by mouth at breakfast, lunch and dinner for the next 5 days   21 tablet   0   . methocarbamol (ROBAXIN) 750 MG tablet   Oral   Take 1 tablet (750 mg total) by mouth 4 (four) times daily as needed (Take 1 tablet every 6 hours as needed for muscle spasms.).   20 tablet   0     BP 176/109  Pulse 116  Temp(Src) 98.7 F (37.1 C) (Oral)  Resp 19  Ht 5\' 11"  (1.803 m)  Wt 274 lb (124.286 kg)  BMI  38.23 kg/m2  SpO2 97%  Physical Exam  Nursing note and vitals reviewed. Constitutional: He appears well-developed and well-nourished. No distress.  HENT:  Head: Normocephalic and atraumatic.  Mouth/Throat: Oropharynx is clear and moist. No oropharyngeal exudate.  Eyes: Conjunctivae are normal. No scleral icterus.  Neck: Normal range of motion. Neck supple.  Cardiovascular: Normal rate, regular rhythm, normal heart sounds and intact distal pulses.  Exam reveals no gallop and no friction rub.   No murmur heard. Pulmonary/Chest: Effort normal and breath sounds normal. No respiratory distress. He has no wheezes.  Abdominal: Soft. Bowel sounds are normal. He exhibits no mass. There is no tenderness. There is no rebound and no guarding.  Musculoskeletal: He exhibits no edema.       Lumbar back: He exhibits decreased range of motion, tenderness and pain. He exhibits no bony tenderness, no swelling, no edema, no deformity, no laceration and no spasm.  Decreased ROM when bending secondary to pain Full ROM in all other joints Left paraspinal tenderness. Reproducible side pain to palpation of left buttock. Sensation in tact.  Neurological: He is alert. He has normal strength and normal reflexes. No cranial nerve deficit or sensory deficit. He exhibits normal muscle tone. GCS eye subscore is 4. GCS verbal subscore is 5. GCS motor subscore is 6.  Reflex Scores:      Patellar reflexes are 2+ on the right side and 2+ on the left side.      Achilles reflexes are 2+ on the right side and 2+ on the left side. Speech is clear and goal oriented, follows commands Normal strength in upper and lower extremities bilaterally, strong and equal grip strength Sensation normal to light and sharp touch Moves extremities without ataxia, coordination intact Normal balance   Skin: Skin is warm and dry. No rash noted. He is not diaphoretic.  Psychiatric: He has a normal mood and affect.    ED Course  Procedures  (including critical care time) DIAGNOSTIC STUDIES: Oxygen Saturation is 97% on room air, adequate by my interpretation.    COORDINATION OF CARE: 11:12 PM Discussed treatment plan which includes pain medication and a resource guide with pt at bedside and pt agreed to plan.     Labs Reviewed - No data to display No results found.   1. Low back pain   2. Sciatica       MDM  Irene Limbo with chronic back pain. He is without injury today.  No neurological deficits and normal neuro exam.  Patient can walk but states is painful.  No loss of bowel or bladder control.  No concern for cauda equina.  No fever, night sweats, weight loss, h/o cancer, IVDU.  RICE protocol and pain medicine indicated and discussed with patient. Patient seen previously and informed that we would no longer be older right further pain medication for him and he was to find a primary care physician. Patient states that his primary care physician requires some money up front and he cannot afford this. I have referred him to a pain clinic as well as discussed with him the importance of pain management with primary care. I again stressed to him that we would be unable to continue to write pain medication for him I have written a prescription for muscle relaxers today  1. Medications: robaxin, usual home medications - including tramadol 2. Treatment: rest, drink plenty of fluids, gentle stretching as discussed, alternate ice and heat 3. Follow Up: Please followup with your primary doctor for discussion of your diagnoses and further evaluation after today's visit; if you do not have a primary care doctor use the resource guide provided to find one;  I personally performed the services described in this documentation, which was scribed in my presence. The recorded information has been reviewed and is accurate.         Dahlia Client Hassen Bruun, PA-C 04/20/12 [phone]

## 2012-04-19 NOTE — Telephone Encounter (Signed)
24 Hour Emergency Line (Weekend):  Pt called stating that his chronic pain has flared up and is currently out of tramadol.  Has sent in refill request but has been unable to fill.  Reports having fallen 2-3 weeks ago and has been having increased back pain since then.  Informed him that I am unfortunately unable to refill pain medications per policy and that if his pain is severe enough I would encourage him to be evaluated in the Urgent Care.  Otherwise call and try to be seen in clinic on Monday or Tuesday or if felt appropriate PCP can refill Tramadol.  Can use Tylenol (1000mg  qid) and Mobic (600 tid).  Kidney function good.  Pt voices understanding and agreeable to try to follow up in AM with Clinic.  Andrena Mews, DO [facility] Resident - PGY-2 04/19/2012 4:41 PM

## 2012-04-19 NOTE — ED Notes (Signed)
Per EMS, Pt from home.  C/O back pain.  HX chronic back pain, degenerative disc dz, HTN.  Pt states he strained his back a couple of days ago.  Called EMS for transport.  Vitals:  176/94, hr 76, resp 18.

## 2012-04-20 ENCOUNTER — Encounter: Payer: Self-pay | Admitting: Family Medicine

## 2012-04-20 ENCOUNTER — Telehealth: Payer: Self-pay | Admitting: Family Medicine

## 2012-04-20 ENCOUNTER — Ambulatory Visit (INDEPENDENT_AMBULATORY_CARE_PROVIDER_SITE_OTHER): Payer: Self-pay | Admitting: Family Medicine

## 2012-04-20 VITALS — BP 158/109 | HR 94 | Temp 99.1°F | Ht 71.0 in | Wt 298.0 lb

## 2012-04-20 DIAGNOSIS — M48061 Spinal stenosis, lumbar region without neurogenic claudication: Secondary | ICD-10-CM

## 2012-04-20 DIAGNOSIS — I10 Essential (primary) hypertension: Secondary | ICD-10-CM

## 2012-04-20 MED ORDER — AMITRIPTYLINE HCL 25 MG PO TABS: 25.0000 mg | ORAL_TABLET | Freq: Every day | ORAL | Status: DC

## 2012-04-20 MED ORDER — CLONIDINE HCL 0.2 MG PO TABS: 0.2000 mg | ORAL_TABLET | Freq: Two times a day (BID) | ORAL | Status: DC

## 2012-04-20 MED ORDER — TRAMADOL HCL 50 MG PO TABS
100.0000 mg | ORAL_TABLET | Freq: Four times a day (QID) | ORAL | Status: DC | PRN
Start: 2012-04-20 — End: 2012-05-06

## 2012-04-20 NOTE — ED Provider Notes (Signed)
Medical screening examination/treatment/procedure(s) were performed by non-physician practitioner and as supervising physician I was immediately available for consultation/collaboration.   Charles B. Sheldon, MD 04/20/12 1417 

## 2012-04-20 NOTE — Telephone Encounter (Signed)
Received after hours emergency line call: patient was seen in clinic today and all his medications were refilled.  He thought he had more refills on clonidine, but did not.  He says they were worried about his blood pressure in clinic today.  He denies chest pain, vision changes, palpitations, LE edema.  Reports BP is 180/110.   Chart reviewed, Patient with BP in clinic was 158/109.  He was seen for back pain by Dr. Louanne Belton.  He is suppsed to follow up with Dr. Shawnie Pons, his PCP in 2 weeks, he says he will make that appointment.  I told him I would refill clonidine x2 weeks, but he needed to make a follow up appointment to have his blood pressure checked.  He voices understanding.   Jennae Hakeem 04/20/2012 8:37 PM

## 2012-04-20 NOTE — Patient Instructions (Signed)
It was good to see you today. Start taking Elavil at night.  Give it 2-3 weeks and see if it helps with your back pain. I have sent in a refill on your tramadol. Once you are back taking your blood pressure medications, please come in for a nurse visit to check your blood pressure.

## 2012-04-20 NOTE — Assessment & Plan Note (Signed)
Refill tramadol.  Patient will try elavil again (last used it ~6 years ago) as a adjuvant medication.  X-ray reviewed.  No evidence of fracture.

## 2012-04-20 NOTE — Progress Notes (Signed)
Patient ID: Travis [name], male   DOB: 1965/09/23, 47 y.o.   MRN: [id number] Subjective: The patient is a 47 y.o. year old male who presents today for back pain.  1. Back pain: Patient has a history of spinal stenosis with a long history of chronic pain. He is currently being maintained on tramadol. He has run out of this medication, and he has had several falls in the last several weeks. As a result his back is been hurting more than normal. He went to the emergency department yesterday and was given a muscle relaxer but she refuses to take as it has previously cause problems with palpitations.  Patient's past medical, social, and family history were reviewed and updated as appropriate. History  Substance Use Topics  . Smoking status: Current Every Day Smoker -- 0.25 packs/day    Types: Cigarettes  . Smokeless tobacco: Not on file  . Alcohol Use: No   Objective:  Filed Vitals:   04/20/12 1011  BP: 158/109  Pulse: 94  Temp: 99.1 F (37.3 C)   Gen: NAD, obese Back: Diffuse pain without significant muscle spasm.  Pain located in low back, paraspinous region along with midline.  Pain to palpation.  No obvious deformity.  Assessment/Plan:  Please also see individual problems in problem list for problem-specific plans.

## 2012-05-04 ENCOUNTER — Ambulatory Visit: Payer: Self-pay | Admitting: Family Medicine

## 2012-05-05 ENCOUNTER — Other Ambulatory Visit: Payer: Self-pay | Admitting: Family Medicine

## 2012-05-05 ENCOUNTER — Encounter (HOSPITAL_COMMUNITY): Payer: Self-pay | Admitting: *Deleted

## 2012-05-05 ENCOUNTER — Emergency Department (INDEPENDENT_AMBULATORY_CARE_PROVIDER_SITE_OTHER)
Admission: EM | Admit: 2012-05-05 | Discharge: 2012-05-05 | Disposition: A | Discharge status: Home / Self Care | Payer: Self-pay | Source: Home / Self Care | Attending: Family Medicine | Admitting: Family Medicine

## 2012-05-05 ENCOUNTER — Other Ambulatory Visit: Payer: Self-pay | Admitting: *Deleted

## 2012-05-05 DIAGNOSIS — G8929 Other chronic pain: Secondary | ICD-10-CM

## 2012-05-05 DIAGNOSIS — F410 Panic disorder [episodic paroxysmal anxiety] without agoraphobia: Secondary | ICD-10-CM

## 2012-05-05 DIAGNOSIS — M549 Dorsalgia, unspecified: Secondary | ICD-10-CM

## 2012-05-05 DIAGNOSIS — I10 Essential (primary) hypertension: Secondary | ICD-10-CM

## 2012-05-05 MED ORDER — LORAZEPAM 1 MG PO TABS: 1.0000 mg | ORAL_TABLET | Freq: Two times a day (BID) | ORAL | Status: DC

## 2012-05-05 MED ORDER — LISINOPRIL 20 MG PO TABS: 20.0000 mg | ORAL_TABLET | Freq: Every day | ORAL | Status: DC

## 2012-05-05 MED ORDER — CLONIDINE HCL 0.2 MG PO TABS: 0.2000 mg | ORAL_TABLET | Freq: Two times a day (BID) | ORAL | Status: DC

## 2012-05-05 MED ORDER — TRAMADOL HCL 50 MG PO TABS: 50.0000 mg | ORAL_TABLET | Freq: Four times a day (QID) | ORAL | Status: DC | PRN

## 2012-05-05 NOTE — ED Provider Notes (Signed)
History     CSN: [id number]  Arrival date & time 05/05/12  1106   First MD Initiated Contact with Patient 05/05/12 1138      Chief Complaint  Patient presents with  . Medication Refill    (Consider location/radiation/quality/duration/timing/severity/associated sxs/prior treatment) Patient is a 47 y.o. male presenting with hypertension. The history is provided by the patient.  Hypertension This is a chronic problem. Episode onset: ran out of bp meds amd fp clinic is closed to refill meds. Pertinent negatives include no chest pain and no shortness of breath.    Past Medical History  Diagnosis Date  . Hypertension   . Anxiety   . Tachycardia - pulse   . Degenerative disk disease   . Spinal stenosis   . Degenerative disk disease     History reviewed. No pertinent past surgical history.  Family History  Problem Relation Age of Onset  . Stroke Mother   . Hypertension Mother   . Stroke Father   . Hypertension Father   . Dementia Father   . Diabetes Father   . Heart disease Father   . Cancer Sister     brain  . Heart disease Brother     History  Substance Use Topics  . Smoking status: Current Every Day Smoker -- 0.25 packs/day    Types: Cigarettes  . Smokeless tobacco: Not on file  . Alcohol Use: No      Review of Systems  Constitutional: Negative.   Respiratory: Negative.  Negative for shortness of breath.   Cardiovascular: Negative.  Negative for chest pain.    Allergies  Blueberry flavor; Penicillins; and Toradol  Home Medications   Current Outpatient Rx  Name  Route  Sig  Dispense  Refill  . cloNIDine (CATAPRES) 0.2 MG tablet   Oral   Take 1 tablet (0.2 mg total) by mouth 2 (two) times daily.   30 tablet   0   . ibuprofen (ADVIL,MOTRIN) 600 MG tablet   Oral   Take 1 tablet (600 mg total) by mouth every 6 (six) hours as needed for pain. For pain   60 tablet   3   . lisinopril (PRINIVIL,ZESTRIL) 20 MG tablet   Oral   Take 1 tablet (20 mg  total) by mouth daily.   30 tablet   3   . LORazepam (ATIVAN) 1 MG tablet   Oral   Take 1 tablet (1 mg total) by mouth 2 (two) times daily as needed for anxiety.   45 tablet   2   . ranitidine (ZANTAC) 150 MG tablet   Oral   Take 150 mg by mouth 2 (two) times daily.         . traMADol (ULTRAM) 50 MG tablet   Oral   Take 2 tablets (100 mg total) by mouth every 6 (six) hours as needed for pain.   60 tablet   2   . amitriptyline (ELAVIL) 25 MG tablet   Oral   Take 1 tablet (25 mg total) by mouth at bedtime.   30 tablet   1   . cloNIDine (CATAPRES) 0.2 MG tablet   Oral   Take 1 tablet (0.2 mg total) by mouth 2 (two) times daily.   60 tablet   0   . EXPIRED: ibuprofen (ADVIL,MOTRIN) 800 MG tablet   Oral   Take 1 tablet (800 mg total) by mouth 3 (three) times daily. Take 800mg  by mouth at breakfast, lunch and dinner for the next 5  days   21 tablet   0   . lisinopril (PRINIVIL,ZESTRIL) 20 MG tablet   Oral   Take 1 tablet (20 mg total) by mouth daily.   30 tablet   0   . LORazepam (ATIVAN) 1 MG tablet   Oral   Take 1 tablet (1 mg total) by mouth 2 (two) times daily.   15 tablet   0   . traMADol (ULTRAM) 50 MG tablet   Oral   Take 1 tablet (50 mg total) by mouth every 6 (six) hours as needed for pain.   15 tablet   0     BP 144/115  Pulse 127  Temp(Src) 97.9 F (36.6 C) (Oral)  Resp 18  SpO2 99%  Physical Exam  Nursing note and vitals reviewed. Constitutional: He is oriented to person, place, and time. He appears well-developed and well-nourished.  HENT:  Head: Normocephalic.  Eyes: Conjunctivae and EOM are normal. Pupils are equal, round, and reactive to light.  Neck: Normal range of motion. Neck supple.  Cardiovascular: Regular rhythm and normal heart sounds.   Pulmonary/Chest: Breath sounds normal.  Musculoskeletal: He exhibits no edema.  Lymphadenopathy:    He has no cervical adenopathy.  Neurological: He is alert and oriented to person,  place, and time.  Skin: Skin is warm and dry.    ED Course  Procedures (including critical care time)  Labs Reviewed - No data to display No results found.   1. Hypertension, essential, benign   2. Anxiety attack   3. Back pain, chronic       MDM          Linna Hoff, MD 05/05/12 1246

## 2012-05-05 NOTE — ED Notes (Signed)
Pt      Ran out  Of   His  meds      yest  Had  An  appt  With  [facility]  Today    But they  Are  Closed   He  Reports  Is   anxious

## 2012-05-06 ENCOUNTER — Ambulatory Visit (INDEPENDENT_AMBULATORY_CARE_PROVIDER_SITE_OTHER): Payer: Self-pay | Admitting: Family Medicine

## 2012-05-06 ENCOUNTER — Encounter: Payer: Self-pay | Admitting: Family Medicine

## 2012-05-06 VITALS — BP 121/89 | HR 108 | Ht 71.0 in | Wt 286.0 lb

## 2012-05-06 DIAGNOSIS — F410 Panic disorder [episodic paroxysmal anxiety] without agoraphobia: Secondary | ICD-10-CM

## 2012-05-06 DIAGNOSIS — I10 Essential (primary) hypertension: Secondary | ICD-10-CM

## 2012-05-06 DIAGNOSIS — M48061 Spinal stenosis, lumbar region without neurogenic claudication: Secondary | ICD-10-CM

## 2012-05-06 DIAGNOSIS — M545 Low back pain: Secondary | ICD-10-CM

## 2012-05-06 MED ORDER — TRAMADOL HCL 50 MG PO TABS: 100.0000 mg | ORAL_TABLET | Freq: Four times a day (QID) | ORAL | Status: DC | PRN

## 2012-05-06 MED ORDER — LORAZEPAM 1 MG PO TABS: 1.0000 mg | ORAL_TABLET | Freq: Two times a day (BID) | ORAL | Status: DC | PRN

## 2012-05-06 MED ORDER — LISINOPRIL 20 MG PO TABS: 20.0000 mg | ORAL_TABLET | Freq: Every day | ORAL | Status: DC

## 2012-05-06 MED ORDER — CLONIDINE HCL 0.2 MG PO TABS: 0.2000 mg | ORAL_TABLET | Freq: Two times a day (BID) | ORAL | Status: DC

## 2012-05-06 NOTE — Assessment & Plan Note (Signed)
Refill provided today, enough for rest of the month.

## 2012-05-06 NOTE — Patient Instructions (Addendum)
Blood pressure looks great today.  Refill for the Tramadol, Ativan.  It was good to meet you today.

## 2012-05-06 NOTE — Progress Notes (Signed)
  Subjective:    Patient ID: Travis [name], male    DOB: 1965/09/20, 47 y.o.   MRN: [id number]  HPI  Hypertension:  Long-term problem for this patient.  No adverse effects from medication.  Not checking it regularly.  No HA, CP, dizziness, shortness of breath, palpitations, or LE swelling.   BP Readings from Last 3 Encounters:  05/06/12 121/89  05/05/12 144/115  04/20/12 158/109   2.  Chronic back pain:  Chronic BL lumbar back pain.  Has been on Tramadol for some time with relief.  Refills were scheduled for yesterday, however unable to make it to clinic secondary to snowfall.  No bladder/bowel incontinence, no acute worsening.   3.  Anxiety:  Takes Ativan for relief of this.  History of panic attacks.  No acute worsening, stable.  Received several days worth of tablets yesterday at Urgent Care, but not enough to last him until the end of the month.    Review of Systems See HPI above for review of systems.       Objective:   Physical Exam  BP 121/89  Pulse 108  Ht 5\' 11"  (1.803 m)  Wt 286 lb (129.729 kg)  BMI 39.91 kg/m2 Gen: Well NAD HEENT: EOMI,  MMM Lungs: CTABL Nl WOB Heart: RRR no MRG Abd: NABS, NT, ND Exts: Non edematous BL  LE, warm and well perfused.  Psych:  Not depressed or anxious appearing.      Assessment & Plan:

## 2012-05-06 NOTE — Assessment & Plan Note (Signed)
Continues to improve with changes in diet, weight loss, stress relief. Refill for ativan provided.

## 2012-05-06 NOTE — Assessment & Plan Note (Signed)
BP much improved today.  Restarted his Clonidine and Lisinopril.

## 2012-05-06 NOTE — Assessment & Plan Note (Signed)
Nothing acute today. Refill for Tramadol.  FU with PCP

## 2012-05-12 ENCOUNTER — Telehealth: Payer: Self-pay | Admitting: Family Medicine

## 2012-05-12 NOTE — Telephone Encounter (Signed)
Last filled tramadol on 03/05  Patient states he puts all  medications  in a pill box for a week and puts the bottles back in the closet.  He realized  today that  all his Tramadol are gone.  Advised  I will have to send message to Dr. Shawnie Pons and will call him back when she has responded. He is agreeable with this plan.
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
## 2012-05-13 ENCOUNTER — Other Ambulatory Visit: Payer: Self-pay | Admitting: *Deleted

## 2012-05-13 DIAGNOSIS — F410 Panic disorder [episodic paroxysmal anxiety] without agoraphobia: Secondary | ICD-10-CM

## 2012-05-13 NOTE — Telephone Encounter (Signed)
This seems to be a recurring theme with this pt.  We will not be refilling this medication.

## 2012-05-13 NOTE — Telephone Encounter (Signed)
Spoke with patient and he wants to know if coming in again to see another  doctor tomorrow  will make any difference because last time someone else refilled it for him,, because he is out and really needs it.  I ask him if this has happened before and he states , no this has never happened before  , and  that by that comment he meant that he had seen another doctor in the past  and medication was refilled. Again gave him message from Dr. Shawnie Pons. Told him I will send this  message back to Dr. Shawnie Pons but she will not refill,.

## 2012-05-13 NOTE — Telephone Encounter (Signed)
Please see note from 12/2.  Also phone note from brother.

## 2012-05-14 ENCOUNTER — Ambulatory Visit: Payer: Self-pay | Admitting: Family Medicine

## 2012-05-14 MED ORDER — LORAZEPAM 1 MG PO TABS: 1.0000 mg | ORAL_TABLET | Freq: Two times a day (BID) | ORAL | Status: DC | PRN

## 2012-05-18 ENCOUNTER — Telehealth: Payer: Self-pay | Admitting: Family Medicine

## 2012-05-18 ENCOUNTER — Ambulatory Visit (INDEPENDENT_AMBULATORY_CARE_PROVIDER_SITE_OTHER): Payer: Self-pay | Admitting: Family Medicine

## 2012-05-18 ENCOUNTER — Encounter: Payer: Self-pay | Admitting: Family Medicine

## 2012-05-18 VITALS — BP 164/121 | HR 124 | Temp 98.3°F | Ht 71.0 in | Wt 281.0 lb

## 2012-05-18 DIAGNOSIS — M48061 Spinal stenosis, lumbar region without neurogenic claudication: Secondary | ICD-10-CM

## 2012-05-18 DIAGNOSIS — I10 Essential (primary) hypertension: Secondary | ICD-10-CM

## 2012-05-18 MED ORDER — TRAMADOL HCL 50 MG PO TABS: 100.0000 mg | ORAL_TABLET | Freq: Four times a day (QID) | ORAL | Status: DC | PRN

## 2012-05-18 MED ORDER — METOPROLOL TARTRATE 25 MG PO TABS: 25.0000 mg | ORAL_TABLET | Freq: Two times a day (BID) | ORAL | Status: DC

## 2012-05-18 MED ORDER — CLONIDINE HCL 0.2 MG PO TABS: 0.2000 mg | ORAL_TABLET | Freq: Two times a day (BID) | ORAL | Status: DC
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
## 2012-05-18 NOTE — Telephone Encounter (Signed)
Pt is asking for his pain and and/or to be seen by Shawnie Pons this afternoon- as I was starting this phn note, he said forget it, I am coming here this morning - appt sched for 10:15 w/ Ritch

## 2012-05-18 NOTE — Assessment & Plan Note (Addendum)
Currently poor control.  No signs of end organ dysfunction. Refill clonidine.  Add metoprolol for tachycardia.  RTC 2 weeks for nurse bp check.

## 2012-05-18 NOTE — Patient Instructions (Signed)
I have refilled your medications. Please take the new medicine (metoprolol) two times per day until we see you again. Please come back to have your blood pressure checked in 2 weeks.

## 2012-05-18 NOTE — Assessment & Plan Note (Signed)
Refill tramadol today

## 2012-05-18 NOTE — Telephone Encounter (Signed)
Consulted with Dr. Deirdre Priest about  appointment this morning and he advises please advise patient that we will be happy to assess his pain but since PCP would not refill pain medication early cannot say for sure that  Doctor that will see him this morning will either . Patient advised.

## 2012-05-20 ENCOUNTER — Encounter: Payer: Self-pay | Admitting: Home Health Services

## 2012-05-26 ENCOUNTER — Telehealth: Payer: Self-pay | Admitting: *Deleted

## 2012-05-26 ENCOUNTER — Other Ambulatory Visit: Payer: Self-pay | Admitting: *Deleted

## 2012-05-26 DIAGNOSIS — F410 Panic disorder [episodic paroxysmal anxiety] without agoraphobia: Secondary | ICD-10-CM

## 2012-05-26 NOTE — Telephone Encounter (Signed)
I can't tell either.  Call in the refill.

## 2012-05-26 NOTE — Telephone Encounter (Signed)
Rx for lorazepam 1 mg called in and given verbally to pharmacist.

## 2012-05-26 NOTE — Telephone Encounter (Signed)
Just need clarification about Ativan. The RX from 03/12 states "printed ". I could not find in  faxed papers from 03/12 or 03/13.  I called pharmacy and was told they do not have an RX for Ativan from 03/12. Last they filled was 03/04. Please advise . Shall I call in or was it given to patient. Tried to call patient , no answer.

## 2012-05-28 ENCOUNTER — Telehealth: Payer: Self-pay | Admitting: Family Medicine

## 2012-05-28 NOTE — Telephone Encounter (Signed)
Will forward to patients PCP, [name].  Emilie Rutter, Darlyne Russian

## 2012-05-28 NOTE — Telephone Encounter (Signed)
It seem Dr Shawnie Pons has been giving patient Ativan,could you please send this message to her to check what she will like to do about this, has I have not assessed patient.Also patient might benefit from tapering off Ativan and starting Buspar or other non-addictive anxiolytic.Have patient follow up and let him know I do not intend to keep him on Ativan unless Dr Shawnie Pons plans to continue it.
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
## 2012-05-29 ENCOUNTER — Inpatient Hospital Stay (HOSPITAL_COMMUNITY)
Admission: EM | Admit: 2012-05-29 | Discharge: 2012-05-30 | DRG: 313 | Disposition: A | Discharge status: Home / Self Care | Payer: 59 | Attending: Family Medicine | Admitting: Family Medicine

## 2012-05-29 ENCOUNTER — Encounter (HOSPITAL_COMMUNITY): Payer: Self-pay | Admitting: Vascular Surgery

## 2012-05-29 ENCOUNTER — Telehealth: Payer: Self-pay | Admitting: *Deleted

## 2012-05-29 ENCOUNTER — Emergency Department (HOSPITAL_COMMUNITY): Payer: 59

## 2012-05-29 DIAGNOSIS — F410 Panic disorder [episodic paroxysmal anxiety] without agoraphobia: Secondary | ICD-10-CM

## 2012-05-29 DIAGNOSIS — Z8249 Family history of ischemic heart disease and other diseases of the circulatory system: Secondary | ICD-10-CM

## 2012-05-29 DIAGNOSIS — E7800 Pure hypercholesterolemia, unspecified: Secondary | ICD-10-CM

## 2012-05-29 DIAGNOSIS — M51379 Other intervertebral disc degeneration, lumbosacral region without mention of lumbar back pain or lower extremity pain: Secondary | ICD-10-CM | POA: Diagnosis present

## 2012-05-29 DIAGNOSIS — F329 Major depressive disorder, single episode, unspecified: Secondary | ICD-10-CM

## 2012-05-29 DIAGNOSIS — Z823 Family history of stroke: Secondary | ICD-10-CM

## 2012-05-29 DIAGNOSIS — Z886 Allergy status to analgesic agent status: Secondary | ICD-10-CM

## 2012-05-29 DIAGNOSIS — R079 Chest pain, unspecified: Principal | ICD-10-CM

## 2012-05-29 DIAGNOSIS — M48061 Spinal stenosis, lumbar region without neurogenic claudication: Secondary | ICD-10-CM

## 2012-05-29 DIAGNOSIS — M5137 Other intervertebral disc degeneration, lumbosacral region: Secondary | ICD-10-CM | POA: Diagnosis present

## 2012-05-29 DIAGNOSIS — R0602 Shortness of breath: Secondary | ICD-10-CM | POA: Diagnosis present

## 2012-05-29 DIAGNOSIS — F411 Generalized anxiety disorder: Secondary | ICD-10-CM | POA: Diagnosis present

## 2012-05-29 DIAGNOSIS — Z6839 Body mass index (BMI) 39.0-39.9, adult: Secondary | ICD-10-CM

## 2012-05-29 DIAGNOSIS — G8929 Other chronic pain: Secondary | ICD-10-CM | POA: Diagnosis present

## 2012-05-29 DIAGNOSIS — K219 Gastro-esophageal reflux disease without esophagitis: Secondary | ICD-10-CM

## 2012-05-29 DIAGNOSIS — Z833 Family history of diabetes mellitus: Secondary | ICD-10-CM

## 2012-05-29 DIAGNOSIS — F3289 Other specified depressive episodes: Secondary | ICD-10-CM

## 2012-05-29 DIAGNOSIS — E785 Hyperlipidemia, unspecified: Secondary | ICD-10-CM | POA: Diagnosis present

## 2012-05-29 DIAGNOSIS — R51 Headache: Secondary | ICD-10-CM

## 2012-05-29 DIAGNOSIS — Z880 Allergy status to penicillin: Secondary | ICD-10-CM

## 2012-05-29 DIAGNOSIS — F17200 Nicotine dependence, unspecified, uncomplicated: Secondary | ICD-10-CM

## 2012-05-29 DIAGNOSIS — Z79899 Other long term (current) drug therapy: Secondary | ICD-10-CM

## 2012-05-29 DIAGNOSIS — I10 Essential (primary) hypertension: Secondary | ICD-10-CM

## 2012-05-29 HISTORY — DX: Tobacco use: Z72.0

## 2012-05-29 HISTORY — DX: Obesity, unspecified: E66.9

## 2012-05-29 LAB — COMPREHENSIVE METABOLIC PANEL
AST: 22 U/L (ref 0–37)
Albumin: 3.9 g/dL (ref 3.5–5.2)
Alkaline Phosphatase: 85 U/L (ref 39–117)
Chloride: 101 mEq/L (ref 96–112)
Potassium: 4 mEq/L (ref 3.5–5.1)
Total Bilirubin: 0.7 mg/dL (ref 0.3–1.2)

## 2012-05-29 LAB — URINE MICROSCOPIC-ADD ON

## 2012-05-29 LAB — CBC
HCT: 47.9 % (ref 39.0–52.0)
Platelets: 238 10*3/uL (ref 150–400)
RDW: 13.4 % (ref 11.5–15.5)
WBC: 13.2 10*3/uL — ABNORMAL HIGH (ref 4.0–10.5)

## 2012-05-29 LAB — RAPID URINE DRUG SCREEN, HOSP PERFORMED
Barbiturates: NOT DETECTED
Tetrahydrocannabinol: POSITIVE — AB

## 2012-05-29 LAB — POCT I-STAT TROPONIN I: Troponin i, poc: 0 ng/mL (ref 0.00–0.08)

## 2012-05-29 LAB — URINALYSIS, ROUTINE W REFLEX MICROSCOPIC
Glucose, UA: NEGATIVE mg/dL
Hgb urine dipstick: NEGATIVE
Ketones, ur: NEGATIVE mg/dL
Protein, ur: NEGATIVE mg/dL

## 2012-05-29 LAB — PROTIME-INR: INR: 1.21 (ref 0.00–1.49)

## 2012-05-29 MED ORDER — SODIUM CHLORIDE 0.9 % IJ SOLN: 3.0000 mL | INTRAMUSCULAR | Status: DC | PRN

## 2012-05-29 MED ORDER — ATORVASTATIN CALCIUM 40 MG PO TABS
40.0000 mg | ORAL_TABLET | Freq: Every day | ORAL | Status: DC
  Filled 2012-05-29: qty 1

## 2012-05-29 MED ORDER — NITROGLYCERIN 0.4 MG SL SUBL: 0.4000 mg | SUBLINGUAL_TABLET | SUBLINGUAL | Status: DC | PRN

## 2012-05-29 MED ORDER — METOPROLOL TARTRATE 25 MG PO TABS
25.0000 mg | ORAL_TABLET | Freq: Two times a day (BID) | ORAL | Status: DC
  Administered 2012-05-29 – 2012-05-30 (×2): 25 mg via ORAL
  Filled 2012-05-29 (×3): qty 1

## 2012-05-29 MED ORDER — MORPHINE SULFATE 4 MG/ML IJ SOLN
4.0000 mg | INTRAMUSCULAR | Status: DC | PRN
  Administered 2012-05-29 – 2012-05-30 (×5): 4 mg via INTRAVENOUS
  Filled 2012-05-29 (×5): qty 1

## 2012-05-29 MED ORDER — ASPIRIN EC 81 MG PO TBEC
81.0000 mg | DELAYED_RELEASE_TABLET | Freq: Every day | ORAL | Status: DC
  Administered 2012-05-30: 81 mg via ORAL
  Filled 2012-05-29: qty 1

## 2012-05-29 MED ORDER — REGADENOSON 0.4 MG/5ML IV SOLN
0.4000 mg | Freq: Once | INTRAVENOUS | Status: DC
  Filled 2012-05-29: qty 5

## 2012-05-29 MED ORDER — ACETAMINOPHEN 325 MG PO TABS: 650.0000 mg | ORAL_TABLET | ORAL | Status: DC | PRN

## 2012-05-29 MED ORDER — SODIUM CHLORIDE 0.9 % IJ SOLN
3.0000 mL | Freq: Two times a day (BID) | INTRAMUSCULAR | Status: DC
  Administered 2012-05-30: 3 mL via INTRAVENOUS

## 2012-05-29 MED ORDER — TRAMADOL HCL 50 MG PO TABS
100.0000 mg | ORAL_TABLET | Freq: Once | ORAL | Status: AC
  Administered 2012-05-29: 100 mg via ORAL
  Filled 2012-05-29: qty 2

## 2012-05-29 MED ORDER — LISINOPRIL 20 MG PO TABS
20.0000 mg | ORAL_TABLET | Freq: Every day | ORAL | Status: DC
  Administered 2012-05-29 – 2012-05-30 (×2): 20 mg via ORAL
  Filled 2012-05-29 (×2): qty 1

## 2012-05-29 MED ORDER — HYDRALAZINE HCL 20 MG/ML IJ SOLN: 5.0000 mg | INTRAMUSCULAR | Status: DC | PRN

## 2012-05-29 MED ORDER — ONDANSETRON HCL 4 MG/2ML IJ SOLN
4.0000 mg | Freq: Once | INTRAMUSCULAR | Status: AC
  Administered 2012-05-29: 4 mg via INTRAVENOUS
  Filled 2012-05-29: qty 2

## 2012-05-29 MED ORDER — PANTOPRAZOLE SODIUM 40 MG IV SOLR
40.0000 mg | Freq: Once | INTRAVENOUS | Status: AC
  Administered 2012-05-29: 40 mg via INTRAVENOUS
  Filled 2012-05-29: qty 40

## 2012-05-29 MED ORDER — LORAZEPAM 2 MG/ML IJ SOLN
1.0000 mg | Freq: Once | INTRAMUSCULAR | Status: AC
  Administered 2012-05-29: 1 mg via INTRAVENOUS
  Filled 2012-05-29: qty 1

## 2012-05-29 MED ORDER — SODIUM CHLORIDE 0.9 % IV SOLN: 250.0000 mL | INTRAVENOUS | Status: DC | PRN

## 2012-05-29 MED ORDER — TRAMADOL HCL 50 MG PO TABS
100.0000 mg | ORAL_TABLET | Freq: Four times a day (QID) | ORAL | Status: DC | PRN
  Administered 2012-05-30: 100 mg via ORAL
  Filled 2012-05-29: qty 2

## 2012-05-29 MED ORDER — MORPHINE SULFATE 4 MG/ML IJ SOLN
4.0000 mg | Freq: Once | INTRAMUSCULAR | Status: AC
  Administered 2012-05-29: 4 mg via INTRAVENOUS
  Filled 2012-05-29: qty 1

## 2012-05-29 MED ORDER — CLONIDINE HCL 0.2 MG PO TABS
0.2000 mg | ORAL_TABLET | Freq: Two times a day (BID) | ORAL | Status: DC
  Administered 2012-05-29 – 2012-05-30 (×2): 0.2 mg via ORAL
  Filled 2012-05-29 (×3): qty 1

## 2012-05-29 MED ORDER — HEPARIN SODIUM (PORCINE) 5000 UNIT/ML IJ SOLN
5000.0000 [IU] | Freq: Three times a day (TID) | INTRAMUSCULAR | Status: DC
  Administered 2012-05-29 – 2012-05-30 (×2): 5000 [IU] via SUBCUTANEOUS
  Filled 2012-05-29 (×5): qty 1

## 2012-05-29 MED ORDER — PANTOPRAZOLE SODIUM 40 MG PO TBEC
40.0000 mg | DELAYED_RELEASE_TABLET | Freq: Every day | ORAL | Status: DC
  Administered 2012-05-29 – 2012-05-30 (×2): 40 mg via ORAL
  Filled 2012-05-29 (×2): qty 1

## 2012-05-29 MED ORDER — LORAZEPAM 1 MG PO TABS
1.0000 mg | ORAL_TABLET | Freq: Two times a day (BID) | ORAL | Status: DC | PRN
  Administered 2012-05-29 – 2012-05-30 (×3): 1 mg via ORAL
  Filled 2012-05-29 (×3): qty 1

## 2012-05-29 MED ORDER — ONDANSETRON HCL 4 MG/2ML IJ SOLN: 4.0000 mg | Freq: Four times a day (QID) | INTRAMUSCULAR | Status: DC | PRN

## 2012-05-29 NOTE — ED Provider Notes (Addendum)
History     CSN: [id number]  Arrival date & time 05/29/12  1133   None     Chief Complaint  Patient presents with  . Chest Pain    (Consider location/radiation/quality/duration/timing/severity/associated sxs/prior treatment) Patient is a 47 y.o. male presenting with chest pain. The history is provided by the patient and medical records. No language interpreter was used.  Chest Pain Pain location:  Substernal area, epigastric and L chest Pain quality: aching, crushing and radiating   Pain quality: not shooting, not stabbing and not tearing   Pain radiates to:  Mid back Pain radiates to the back: yes   Pain severity:  Severe Onset quality:  Sudden Duration:  2 hours Timing:  Constant Progression:  Improving Chronicity:  New Context: at rest   Context: not eating   Relieved by:  Aspirin and nitroglycerin Worsened by:  Nothing tried Associated symptoms: anxiety, back pain, diaphoresis and nausea   Associated symptoms: no abdominal pain, no cough, no fever, no headache, no heartburn, no lower extremity edema, no near-syncope, no numbness, no palpitations, no shortness of breath and not vomiting   Risk factors: high cholesterol, hypertension, male sex, obesity and smoking     Travis [name] is a 47 year old gentleman with a past medical history of obesity, spinal stenosis and degenerative disc disease, chronic back pain, 34 pack year smoking history, hypertension and anxiety who presents to the emergency department with chief complaint of chest pain.  Patient has been under significant stress as his father died last week and he was asked to identify his body.  He states that he has been ruminating about father and has been getting very little sleep.  Patient states he did fall asleep briefly this morning was awoken from sleep around 6 AM with severe pain in his chest which he felt radiated to his back.  He states that this is different from his normal chronic back pain.  He denies any  radiation to left arm he denies any paresthesias in the hands, he denies any jaw pain, nausea, vomiting, or diaphoresis. He has a medical history of GERD he does not take any medication for this.  Patient takes tramadol for his chronic pain.  She also has a history of anxiety attacks but states that the pain that he feels today is nothing like his previous anxiety attacks.  Patient states that his pain as well as 10 out of 10.  324 mg of aspirin and 2 nitroglycerin as before arrival with mild relief of symptoms down to 8/10.  Patient denies a history of unilateral leg swelling, history of clotting or DVT.   Past Medical History  Diagnosis Date  . Hypertension   . Anxiety   . Tachycardia - pulse   . Degenerative disk disease   . Spinal stenosis   . Degenerative disk disease     History reviewed. No pertinent past surgical history.  Family History  Problem Relation Age of Onset  . Stroke Mother   . Hypertension Mother   . Stroke Father   . Hypertension Father   . Dementia Father   . Diabetes Father   . Heart disease Father   . Cancer Sister     brain  . Heart disease Brother     History  Substance Use Topics  . Smoking status: Current Every Day Smoker -- 0.25 packs/day    Types: Cigarettes  . Smokeless tobacco: Not on file  . Alcohol Use: No      Review  of Systems  Constitutional: Positive for diaphoresis. Negative for fever and chills.  Respiratory: Negative for cough, shortness of breath and wheezing.   Cardiovascular: Positive for chest pain. Negative for palpitations, leg swelling and near-syncope.       Patient complains of heavy chest pressure.  He denies shortness of breath.  Gastrointestinal: Positive for nausea. Negative for heartburn, vomiting, abdominal pain, diarrhea and constipation.  Genitourinary: Negative for dysuria, urgency and frequency.  Musculoskeletal: Positive for back pain. Negative for myalgias and arthralgias.  Skin: Negative for rash.   Neurological: Negative for numbness and headaches.  Psychiatric/Behavioral:       Anxious    Allergies  Blueberry flavor; Penicillins; and Toradol  Home Medications   Current Outpatient Rx  Name  Route  Sig  Dispense  Refill  . amitriptyline (ELAVIL) 25 MG tablet   Oral   Take 1 tablet (25 mg total) by mouth at bedtime.   30 tablet   1   . cloNIDine (CATAPRES) 0.2 MG tablet   Oral   Take 1 tablet (0.2 mg total) by mouth 2 (two) times daily.   60 tablet   1   . lisinopril (PRINIVIL,ZESTRIL) 20 MG tablet   Oral   Take 1 tablet (20 mg total) by mouth daily.   30 tablet   1   . LORazepam (ATIVAN) 1 MG tablet   Oral   Take 1 tablet (1 mg total) by mouth 2 (two) times daily as needed for anxiety.   45 tablet   1   . metoprolol tartrate (LOPRESSOR) 25 MG tablet   Oral   Take 1 tablet (25 mg total) by mouth 2 (two) times daily.   60 tablet   0   . ranitidine (ZANTAC) 150 MG tablet   Oral   Take 150 mg by mouth 2 (two) times daily.         . traMADol (ULTRAM) 50 MG tablet   Oral   Take 2 tablets (100 mg total) by mouth every 6 (six) hours as needed for pain.   120 tablet   1     BP 125/91  Pulse 86  Temp(Src) 98.7 F (37.1 C) (Oral)  Resp 18  SpO2 98%  Physical Exam  Nursing note and vitals reviewed. Constitutional: He appears well-developed and well-nourished. No distress.  Morbidly obese, unkempt  HENT:  Head: Normocephalic and atraumatic.  Eyes: Conjunctivae are normal. No scleral icterus.  Neck: Normal range of motion. Neck supple.  Cardiovascular: Normal rate, regular rhythm, normal heart sounds and intact distal pulses.  Exam reveals no gallop and no friction rub.   No murmur heard. Pulmonary/Chest: Effort normal and breath sounds normal. No respiratory distress.  Abdominal: Soft. He exhibits distension. There is no tenderness. There is no guarding.  Musculoskeletal: He exhibits no edema.  Neurological: He is alert.  Skin: Skin is warm and  dry. He is not diaphoretic.  Psychiatric: His behavior is normal.    ED Course  Procedures (including critical care time)  Labs Reviewed  CBC - Abnormal; Notable for the following:    WBC 13.2 (*)    All other components within normal limits  COMPREHENSIVE METABOLIC PANEL  URINALYSIS, ROUTINE W REFLEX MICROSCOPIC   No results found.   Date: 05/29/2012  Rate: 77  Rhythm: normal sinus rhythm  QRS Axis: normal  Intervals: normal  ST/T Wave abnormalities: nonspecific T wave changes  Conduction Disutrbances:none  Narrative Interpretation:   Old EKG Reviewed: changes noted  1. Chest pain   2. Headache   3. Spinal stenosis, lumbar region, without neurogenic claudication   4. Tobacco use disorder   5. Unspecified essential hypertension   6. Depressive disorder, not elsewhere classified   7. Esophageal reflux   8. Pure hypercholesterolemia   9. Panic attack       MDM  MDM Number of Diagnoses or Management Options Chest pain:  Depressive disorder, not elsewhere classified:  Esophageal reflux:  Headache:  Panic attack:  Pure hypercholesterolemia:  Spinal stenosis, lumbar region, without neurogenic claudication:  Tobacco use disorder:  Unspecified essential hypertension:     Patient With chest pain and psychiatric history including significant stress which may be a contributing factor to presenting complaint.Patient has pain that radiates to the back, but I do not suspect aortic dissection.Differential includes, anxiety, GERD,ACS, PE, and  The patient also has significant risk factors for ACS including smoking, obesity, htn, hyperlipidemia. Patient is low risk wells and PERC negative.  Patient has T wave inversions in Lead III not seen on previous EKG, but without reciprocal changes.  Will begin work up for ACS.     Patient with some bronchitic changes on Chest xray which was viewed by myself.  Negative troponin. Elevated Glucose but paitnet's CMP is otherwise  unremarkable. I have spoken with Dr. Rodman Pickle about the patient and have expressed that i believe he needs admissions and cradiac rule out. Dr. Mikel Cella agrees and will accept the patient for admission, although UA is still pending.  I have also discussed the case with Dr. Deretha Emory who agrees with my plan of care. The patient appears reasonably stabilized for admission considering the current resources, flow, and capabilities available in the ED at this time, and I doubt any other [facility] requiring further screening and/or treatment in the ED prior to admission.      Arthor Captain, PA-C 06/02/12 [id number], PA-C 06/15/12 [phone]

## 2012-05-29 NOTE — Consult Note (Signed)
Cardiology Consult Note   Patient ID: Travis [name] MRN: [id number], DOB/AGE: 1965/05/19   Admit date: 05/29/2012 Date of Consult: 05/29/2012  Primary Physician: [name], MD Primary Cardiologist: New   Reason for consult: evaluation/management of chest pain  HPI: Travis [name] is a 47 y.o. male with PMHx s/f tobacco abuse, HTN, dyslipidemia, obesity, GERD, chronic back pain- spinal stenosis, DDD, anxiety and h/o opiate and polysubstance abuse who was admitted to [facility] by the family medicine teaching service today for chest pain.    He reports being under significant stress and grief. His father recently passed away. He has chronic back pain, and has been pleading his case for disability. He is currently in the process of applying to Medicaid. He had an argument with his grandmother last night and went to bed. He suddenly awoke around 6:00 AM this morning complaining of both a sharp and heavy substernal chest discomfort rated at a 10/10 without radiation with associated shortness of breath, lightheadedness and nausea. He denies prior episodes of chest discomfort. Unable to associate with exertion as he is functionally limited due to chronic pain. No change with position. No relation to meals. He denies worsening SOB/DOE, LE edema, PND, orthopnea or syncope. He does report having an episode of tachy-palpitations in the past (HR in the 200s) for which he was admitted and placed on a beta blocker- unable to find this in the chart. Reviewing his chart, he has several ED presentations for back pain, panic attacks, anxiety and opiate abuse. No fevers, chills, diarrhea, active bleeding, unilateral leg swelling, pain or redness. He denies EtOH or elicit drug use. 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 Admits to smoking 4-5 cigarettes a day. Family history of CAD- father with  multiple MIs, first at 71. Mother with TIA/CVA. Diabetes on the father's side.    In the ED, EKG demonstrates no ischemic changes. POC trop-I x 3 WNL. CXR reveals stable bronchitic changes, interstitial prominence and no acute superimposed process. CBC- mild leukocytosis at 13.2. BMET unremarkable. UDS + for THC, benzos, opiates. U/A unremarkable. VS fairly stable since admission. Currently complaining of back pain, but no chest pain.   Problem List: Past Medical History  Diagnosis Date  . Hypertension   . Anxiety   . Tachycardia - pulse   . Degenerative disk disease   . Spinal stenosis   . Degenerative disk disease     History reviewed. No pertinent past surgical history.   Allergies:  Allergies  Allergen Reactions  . Blueberry Flavor Hives  . Penicillins Hives, Itching and Other (See Comments)    Hallucinations.  . Toradol (Ketorolac Tromethamine) Hives and Other (See Comments)    Headache    Home Medications: Prior to Admission medications   Medication Sig Start Date End Date Taking? Authorizing Provider  cloNIDine (CATAPRES) 0.2 MG tablet Take 1 tablet (0.2 mg total) by mouth 2 (two) times daily. 05/18/12  Yes Brent Bulla, MD  lisinopril (PRINIVIL,ZESTRIL) 20 MG tablet Take 1 tablet (20 mg total) by mouth daily. 05/06/12  Yes Tobey Grim, MD  LORazepam (ATIVAN) 1 MG tablet Take 1 tablet (1 mg total) by mouth 2 (two) times daily as needed for anxiety. 05/13/12  Yes Reva Bores, MD  metoprolol tartrate (LOPRESSOR) 25 MG tablet Take 1 tablet (25 mg total) by mouth 2 (two) times daily. 05/18/12  Yes  Brent Bulla, MD  ranitidine (ZANTAC) 150 MG tablet Take 150 mg by mouth 2 (two) times daily.   Yes Historical Provider, MD  traMADol (ULTRAM) 50 MG tablet Take 2 tablets (100 mg total) by mouth every 6 (six) hours as needed for pain. 05/18/12  Yes Brent Bulla, MD    Inpatient Medications:  . [START ON 05/30/2012] aspirin EC  81 mg Oral Daily  . [START ON 05/30/2012] atorvastatin   40 mg Oral q1800  . cloNIDine  0.2 mg Oral BID  . heparin  5,000 Units Subcutaneous Q8H  . lisinopril  20 mg Oral Daily  . metoprolol tartrate  25 mg Oral BID  . pantoprazole  40 mg Oral Daily  . sodium chloride  3 mL Intravenous Q12H   Prescriptions prior to admission  Medication Sig Dispense Refill  . cloNIDine (CATAPRES) 0.2 MG tablet Take 1 tablet (0.2 mg total) by mouth 2 (two) times daily.  60 tablet  1  . lisinopril (PRINIVIL,ZESTRIL) 20 MG tablet Take 1 tablet (20 mg total) by mouth daily.  30 tablet  1  . LORazepam (ATIVAN) 1 MG tablet Take 1 tablet (1 mg total) by mouth 2 (two) times daily as needed for anxiety.  45 tablet  1  . metoprolol tartrate (LOPRESSOR) 25 MG tablet Take 1 tablet (25 mg total) by mouth 2 (two) times daily.  60 tablet  0  . ranitidine (ZANTAC) 150 MG tablet Take 150 mg by mouth 2 (two) times daily.      . traMADol (ULTRAM) 50 MG tablet Take 2 tablets (100 mg total) by mouth every 6 (six) hours as needed for pain.  120 tablet  1    Family History  Problem Relation Age of Onset  . Stroke Mother   . Hypertension Mother   . Stroke Father   . Hypertension Father   . Dementia Father   . Diabetes Father   . Heart disease Father   . Cancer Sister     brain  . Heart disease Brother      History   Social History  . Marital Status: Divorced    Spouse Name: N/A    Number of Children: N/A  . Years of Education: N/A   Occupational History  . Not on file.   Social History Main Topics  . Smoking status: Current Every Day Smoker -- 0.25 packs/day    Types: Cigarettes  . Smokeless tobacco: Not on file  . Alcohol Use: No  . Drug Use: No  . Sexually Active: No   Other Topics Concern  . Not on file   Social History Narrative  . No narrative on file     Review of Systems: General: negative for chills, fever, night sweats or weight changes.  Cardiovascular: positive for chest pain, shortness of breath, negative for dyspnea on exertion, edema,  orthopnea, palpitations, paroxysmal nocturnal dyspnea  Dermatological: negative for rash Respiratory: negative for cough or wheezing Urologic: negative for hematuria Abdominal: positive for nausea, negative for vomiting, diarrhea, bright red blood per rectum, melena, or hematemesis Neurologic: positive for lightheadedness, negative for visual changes, syncope Psych: positive for anxiety All other systems reviewed and are otherwise negative except as noted above.  Physical Exam: Blood pressure 157/104, pulse 107, temperature 98.9 F (37.2 C), temperature source Oral, resp. rate 20, height 5\' 10"  (1.778 m), weight 125.3 kg (276 lb 3.8 oz), SpO2 97.00%.    General: Well developed, well nourished, in no acute distress. Head: Normocephalic,  atraumatic, sclera non-icteric, no xanthomas, nares are without discharge. Neck:  Negative for carotid bruits. JVD not elevated. Lungs: Clear bilaterally to auscultation without wheezes, rales, or rhonchi. Breathing is unlabored. Heart: RRR with S1 S2. No murmurs, rubs, or gallops appreciated. Abdomen: Soft, non-tender, non-distended with normoactive bowel sounds. No hepatomegaly. No rebound/guarding. No obvious abdominal masses. Msk:  Strength and tone appears normal for age. Extremities: No clubbing, cyanosis or edema.  Distal pedal pulses are 2+ and equal bilaterally. Neuro:  Alert and oriented X 3. Moves all extremities spontaneously. Psych:  Anxious, responds to questions appropriately with a normal affect.  Labs: Recent Labs     05/29/12  1211  WBC  13.2*  HGB  16.9  HCT  47.9  MCV  83.2  PLT  238   Recent Labs Lab 05/29/12 1211  NA 137  K 4.0  CL 101  CO2 24  BUN 7  CREATININE 1.04  CALCIUM 9.5  PROT 8.3  BILITOT 0.7  ALKPHOS 85  ALT 15  AST 22  GLUCOSE 115*   Radiology/Studies: Dg Chest 2 View  05/29/2012  *RADIOLOGY REPORT*  Clinical Data: Left chest pain, shortness of breath, nausea, dizziness  CHEST - 2 VIEW  Comparison:  01/13/2008  Findings: Normal heart size and vascularity.  Stable chronic bronchitic changes and mild interstitial prominence as before.  No interval change.  No focal pneumonia, CHF, collapse, consolidation, effusion or pneumothorax.  Trachea is midline.  No osseous abnormality.  IMPRESSION: Stable bronchitic changes and interstitial prominence.  No superimposed acute process.   Original Report Authenticated By: Judie Petit. Shick, M.D.     EKG: NSR, 77 bpm, TWI III, TW flattening aVF, RAE, no ST changes (inferior TW flattening on prior tracings)  ASSESSMENT AND PLAN:   47 y.o. male with PMHx s/f tobacco abuse, HTN, dyslipidemia, obesity, GERD, chronic back pain- spinal stenosis, DDD, anxiety and h/o opiate and polysubstance abuse who was admitted to [facility] by the family medicine teaching service today for chest pain.    1. Chest pain, moderate risk of cardiac etiology 2. Dyslipidemia 3. Hypertension 4. Tobacco abuse 5. H/o unspecified tachycardia 6. Family history of CAD   7. GERD 8. Obesity  9. Generalized anxiety disorder 10. Grief 11. Spinal stenosis 12. DDD 13. H/o opiate abuse  14. H/o polysubstance abuse  The patient's chest pain description contains both typical and atypical features of ACS. Given the surrounding circumstances- patient with underlying anxiety, punctuated by stress and grief- discomfort could certainly represent anxiety. He is at moderate pretest probability however and has never had an ischemic evaluation. His father had a MI in his early 9s. Would benefit from formal rule out and nuclear stress testing tomorrow. Will arrange for this. Continue ASA, ACEi, BB, statin, NTG SL PRN. If normal stress test, would investigate non-cardiac etiologies.    Signed, R. Hurman Horn, PA-C 05/29/2012, 7:18 PM  Patient seen and examined. I agree with the assessment and plan as detailed above. See also my additional thoughts below.   The patient is stable at this time. I  agree with the complete note above. I reviewed all of the information. I examined the patient and spoke to him. At this point there is no definite evidence of an acute coronary syndrome. However I do feel it would be appropriate to proceed with exercise testing in the hospital tomorrow. We will arrange this.  Willa Rough, MD, [facility] 05/29/2012 8:08 PM

## 2012-05-29 NOTE — ED Provider Notes (Signed)
Medical screening examination/treatment/procedure(s) were conducted as a shared visit with non-physician practitioner(s) and myself.  I personally evaluated the patient during the encounter  Patient seen by me. Patient took concerning chest pain for a cardiac equivalent. He awoke this morning with chest pain patient states a rate pain radiates through to his chest and into the back area and down his left arm. Patient denies any numbness or tingling. Pain was initially 10 out of 10 when down to the knee 10. Patient has a strong family history of cardiac problems. Patient will need admission and rule out. Labs initially were all negative for any acute cardiac event. The patient will be admitted by teaching service.   Results for orders placed during the hospital encounter of 05/29/12  CBC      Result Value Range   WBC 13.2 (*) 4.0 - 10.5 K/uL   RBC 5.76  4.22 - 5.81 MIL/uL   Hemoglobin 16.9  13.0 - 17.0 g/dL   HCT 16.1  09.6 - 04.5 %   MCV 83.2  78.0 - 100.0 fL   MCH 29.3  26.0 - 34.0 pg   MCHC 35.3  30.0 - 36.0 g/dL   RDW 40.9  81.1 - 91.4 %   Platelets 238  150 - 400 K/uL  COMPREHENSIVE METABOLIC PANEL      Result Value Range   Sodium 137  135 - 145 mEq/L   Potassium 4.0  3.5 - 5.1 mEq/L   Chloride 101  96 - 112 mEq/L   CO2 24  19 - 32 mEq/L   Glucose, Bld 115 (*) 70 - 99 mg/dL   BUN 7  6 - 23 mg/dL   Creatinine, Ser 7.82  0.50 - 1.35 mg/dL   Calcium 9.5  8.4 - 95.6 mg/dL   Total Protein 8.3  6.0 - 8.3 g/dL   Albumin 3.9  3.5 - 5.2 g/dL   AST 22  0 - 37 U/L   ALT 15  0 - 53 U/L   Alkaline Phosphatase 85  39 - 117 U/L   Total Bilirubin 0.7  0.3 - 1.2 mg/dL   GFR calc non Af Amer 85 (*) >90 mL/min   GFR calc Af Amer >90  >90 mL/min  URINALYSIS, ROUTINE W REFLEX MICROSCOPIC      Result Value Range   Color, Urine YELLOW  YELLOW   APPearance CLEAR  CLEAR   Specific Gravity, Urine 1.017  1.005 - 1.030   pH 6.0  5.0 - 8.0   Glucose, UA NEGATIVE  NEGATIVE mg/dL   Hgb urine dipstick  NEGATIVE  NEGATIVE   Bilirubin Urine NEGATIVE  NEGATIVE   Ketones, ur NEGATIVE  NEGATIVE mg/dL   Protein, ur NEGATIVE  NEGATIVE mg/dL   Urobilinogen, UA 1.0  0.0 - 1.0 mg/dL   Nitrite NEGATIVE  NEGATIVE   Leukocytes, UA TRACE (*) NEGATIVE  URINE MICROSCOPIC-ADD ON      Result Value Range   Squamous Epithelial / LPF RARE  RARE   WBC, UA 0-2  <3 WBC/hpf   Bacteria, UA RARE  RARE   Urine-Other MUCOUS PRESENT    POCT I-STAT TROPONIN I      Result Value Range   Troponin i, poc 0.00  0.00 - 0.08 ng/mL   Comment 3           POCT I-STAT TROPONIN I      Result Value Range   Troponin i, poc 0.00  0.00 - 0.08 ng/mL   Comment 3  POCT I-STAT TROPONIN I      Result Value Range   Troponin i, poc 0.00  0.00 - 0.08 ng/mL   Comment 3              Shelda Jakes, MD 05/29/12 [phone]

## 2012-05-29 NOTE — Telephone Encounter (Signed)
Noted.  Will call patient next week to schedule follow-up appt with PCP.  Gaylene Brooks, RN

## 2012-05-29 NOTE — Telephone Encounter (Signed)
It seems like pt. Continues to have issues with medication.  Several different family members have called in with complaints.  He also calls or comes in with stories of losing his medication and prescriptions, them being stolen, etc.  I will stop refilling his medication at this point.  He is exhibiting drug seeking behavior.  Chart flagged.

## 2012-05-29 NOTE — Telephone Encounter (Signed)
Patient calling because BP--191/154 and pulse 65.  Takes Clonidine.  C/o left "chest pain and pressure", nausea.  Denies any dizziness, blurred vision, numbness, or tingling.  No shortness of breath.  Informed patient to call EMS.  Patient agrees and will call EMS.  Gaylene Brooks, RN

## 2012-05-29 NOTE — Telephone Encounter (Signed)
Noted  

## 2012-05-29 NOTE — ED Notes (Addendum)
Pt reports to the ED for eval of left sided CP and pressure that awoke him from sleep this am. Pt reports the pain radiates through his chest into his back and down into his left leg. Pt denies any numbness, tingling, bowel or bladder changes, or paralysis. Pt ambulatory PTA. Pt denies any  Pt has had 324 of ASA and 2 nitros PTA. Pt reports a decrease in pain from 10/10 to 8/10. 12 lead unremarkable per EMS. Pt also reports SOB, nausea and diarrhea, and diaphoresis. Pt reports he has family hx of cardiac problems and he has a hx of rapid HR. Pt also reports his father passed away about a week ago and he has not been sleeping or eating well and he reports increased stress. Pt is A&O x4.

## 2012-05-29 NOTE — Telephone Encounter (Signed)
Thanks you jeannette, have patient follow up with me after ER discharge to reassess for his BP,please also alert patient I will not be filling his Ambien,as per Dr Shawnie Pons he is exibiting drug seeking behavior,hence she would not be filling his meds,i do not intend to take over the refilling of his Ambien either,I am concern he might overdose on it at some point.
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
## 2012-05-29 NOTE — ED Notes (Signed)
Heart healthy meal tray ordered for pt.  

## 2012-05-30 ENCOUNTER — Other Ambulatory Visit: Payer: Self-pay | Admitting: Physician Assistant

## 2012-05-30 DIAGNOSIS — F410 Panic disorder [episodic paroxysmal anxiety] without agoraphobia: Secondary | ICD-10-CM

## 2012-05-30 DIAGNOSIS — I10 Essential (primary) hypertension: Secondary | ICD-10-CM

## 2012-05-30 DIAGNOSIS — K219 Gastro-esophageal reflux disease without esophagitis: Secondary | ICD-10-CM

## 2012-05-30 DIAGNOSIS — E7800 Pure hypercholesterolemia, unspecified: Secondary | ICD-10-CM

## 2012-05-30 DIAGNOSIS — F17200 Nicotine dependence, unspecified, uncomplicated: Secondary | ICD-10-CM

## 2012-05-30 DIAGNOSIS — F329 Major depressive disorder, single episode, unspecified: Secondary | ICD-10-CM

## 2012-05-30 LAB — BASIC METABOLIC PANEL
CO2: 29 mEq/L (ref 19–32)
Calcium: 8.6 mg/dL (ref 8.4–10.5)
Chloride: 102 mEq/L (ref 96–112)
Glucose, Bld: 83 mg/dL (ref 70–99)
Sodium: 138 mEq/L (ref 135–145)

## 2012-05-30 LAB — CBC
Hemoglobin: 15 g/dL (ref 13.0–17.0)
MCH: 29.1 pg (ref 26.0–34.0)
MCV: 82 fL (ref 78.0–100.0)
RBC: 5.16 MIL/uL (ref 4.22–5.81)
WBC: 13.8 10*3/uL — ABNORMAL HIGH (ref 4.0–10.5)

## 2012-05-30 LAB — TROPONIN I
Troponin I: <0.3 ng/mL (ref <0.30)
Troponin I: <0.3 ng/mL (ref <0.30)

## 2012-05-30 LAB — LIPID PANEL
HDL: 25 mg/dL — ABNORMAL LOW (ref >39)
Total CHOL/HDL Ratio: 4.6 RATIO
Triglycerides: 87 mg/dL (ref <150)

## 2012-05-30 LAB — HEMOGLOBIN A1C: Hgb A1c MFr Bld: 5.9 % — ABNORMAL HIGH (ref <5.7)

## 2012-05-30 MED ORDER — SODIUM CHLORIDE 0.9 % IV BOLUS (SEPSIS): 1000.0000 mL | Freq: Once | INTRAVENOUS | Status: DC

## 2012-05-30 MED ORDER — PRAVASTATIN SODIUM 40 MG PO TABS: 40.0000 mg | ORAL_TABLET | Freq: Every day | ORAL | Status: DC

## 2012-05-30 MED ORDER — ASPIRIN 81 MG PO TBEC: 81.0000 mg | DELAYED_RELEASE_TABLET | Freq: Every day | ORAL | Status: DC

## 2012-05-30 NOTE — H&P (Signed)
Family Medicine Teaching Service Attending Note  I interviewed and examined patient Travis [name] and reviewed their tests and x-rays.  I discussed with Dr. Mikel Cella and reviewed their note for today.  I agree with their assessment and plan.     Additionally  No chest pain this AM.  Was brought on by emotion No signs of ACS.   Dorna Bloom as per cardiology - appreciate their input  Hopefully he will work on risk reduction as outpatient

## 2012-05-30 NOTE — Progress Notes (Signed)
   Primary cardiologist: Dr. Zackery Barefoot  Subjective:   No further chest pain. Wants to go home.   Objective:   Temp:  [98.3 F (36.8 C)-98.9 F (37.2 C)] 98.3 F (36.8 C) (03/29 0520) Pulse Rate:  [66-107] 68 (03/29 0520) Resp:  [12-22] 18 (03/29 0520) BP: (108-157)/(71-132) 154/110 mmHg (03/29 1016) SpO2:  [96 %-100 %] 98 % (03/29 0520) Weight:  [276 lb 3.8 oz (125.3 kg)] 276 lb 3.8 oz (125.3 kg) (03/28 1821) Last BM Date: 05/29/12  Filed Weights   05/29/12 1821  Weight: 276 lb 3.8 oz (125.3 kg)    Intake/Output Summary (Last 24 hours) at 05/30/12 1201 Last data filed at 05/30/12 0820  Gross per 24 hour  Intake    360 ml  Output      1 ml  Net    359 ml   Telemetry: Sinus rhythm.  Exam:  General: NAD.  Lungs: Clear, nonlabored.  Cardiac: RRR, no gallop.  Lab Results:  Basic Metabolic Panel:  Recent Labs Lab 05/29/12 1211 05/29/12 2053 05/30/12 0500  NA 137  --  138  K 4.0  --  3.6  CL 101  --  102  CO2 24  --  29  GLUCOSE 115*  --  83  BUN 7  --  10  CREATININE 1.04  --  1.19  CALCIUM 9.5  --  8.6  MG  --  2.0  --     Liver Function Tests:  Recent Labs Lab 05/29/12 1211  AST 22  ALT 15  ALKPHOS 85  BILITOT 0.7  PROT 8.3  ALBUMIN 3.9    CBC:  Recent Labs Lab 05/29/12 1211 05/30/12 0500  WBC 13.2* 13.8*  HGB 16.9 15.0  HCT 47.9 42.3  MCV 83.2 82.0  PLT 238 171    Cardiac Enzymes:  Recent Labs Lab 05/29/12 2054 05/30/12 0122 05/30/12 0727  TROPONINI <0.30 <0.30 <0.30    BNP:  Recent Labs  05/29/12 2054  PROBNP 85.1    Coagulation:  Recent Labs Lab 05/29/12 2053  INR 1.21    ECG: Sinus rhythm with NSST changes, PVC.   Medications:   Scheduled Medications: . aspirin EC  81 mg Oral Daily  . atorvastatin  40 mg Oral q1800  . cloNIDine  0.2 mg Oral BID  . heparin  5,000 Units Subcutaneous Q8H  . lisinopril  20 mg Oral Daily  . metoprolol tartrate  25 mg Oral BID  . pantoprazole  40 mg Oral Daily  .  regadenoson  0.4 mg Intravenous Once  . sodium chloride  3 mL Intravenous Q12H      PRN Medications:  sodium chloride, acetaminophen, hydrALAZINE, LORazepam, morphine injection, nitroGLYCERIN, ondansetron (ZOFRAN) IV, sodium chloride, traMADol   Assessment:   1. Chest pain syndrome, ruled out for MI with normal troponin I and BNP, ECG nonspecific. No further chest pain. Recent psychosocial stress and grief.  2. Morbid obesity, weight 276 pounds, will need two day protocol Myoview.  3. HTN.  4. Tobacco abuse.  5. Family history of CAD.   Plan/Discussion:    Patient does not want to stay for two day protocol Myoview. He want to go home today. Suggest medical therapy and we will arrange an outpatient two day protocol Lexiscan Myoview in our office. Followup with PA to review the study also arranged. Discussed with Ms. Raford Pitcher PAC who will be making arrangements.   Jonelle Sidle, M.D., F.A.C.C.
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
## 2012-05-30 NOTE — Progress Notes (Signed)
Daily Progress Note Family Medicine Teaching Service Service Pager: [phone]  Subjective: Pt is chest pain free this morning with no further episodes of chest pain since admission. Continues to have chronic back pain, but no other complaints other than hunger this morning  Objective: Vital signs in last 24 hours: Temp:  [98.3 F (36.8 C)-98.9 F (37.2 C)] 98.3 F (36.8 C) (03/29 0520) Pulse Rate:  [66-107] 68 (03/29 0520) Resp:  [12-22] 18 (03/29 0520) BP: (108-157)/(71-132) 135/88 mmHg (03/29 0520) SpO2:  [96 %-100 %] 98 % (03/29 0520) Weight:  [276 lb 3.8 oz (125.3 kg)] 276 lb 3.8 oz (125.3 kg) (03/28 1821) Weight change:  Last BM Date: 05/29/12  Intake/Output from previous day: 03/28 0701 - 03/29 0700 In: 360 [P.O.:360] Out: -  Intake/Output this shift:   Gen: Lying flat in bed watching TV, NAD. HEENT: AT, [place]. MMM Cardio: RRR, no murmur Lungs: CTAB Extremities: No edema. Moves all extremities Neuro: Grossly intact but slow to move due to pain  Lab Results:  Recent Labs  05/29/12 1211 05/30/12 0500  WBC 13.2* 13.8*  HGB 16.9 15.0  HCT 47.9 42.3  PLT 238 171   BMET  Recent Labs  05/29/12 1211 05/30/12 0500  NA 137 138  K 4.0 3.6  CL 101 102  CO2 24 29  GLUCOSE 115* 83  BUN 7 10  CREATININE 1.04 1.19  CALCIUM 9.5 8.6    Studies/Results: Dg Chest 2 View  05/29/2012  *RADIOLOGY REPORT*  Clinical Data: Left chest pain, shortness of breath, nausea, dizziness  CHEST - 2 VIEW  Comparison: 01/13/2008  Findings: Normal heart size and vascularity.  Stable chronic bronchitic changes and mild interstitial prominence as before.  No interval change.  No focal pneumonia, CHF, collapse, consolidation, effusion or pneumothorax.  Trachea is midline.  No osseous abnormality.  IMPRESSION: Stable bronchitic changes and interstitial prominence.  No superimposed acute process.   Original Report Authenticated By: Judie Petit. Miles Costain, M.D.     Medications: I have reviewed the patient's  current medications.  Assessment/Plan: 47 yo M with chest pain   # Chest pain- Troponin negative, no events on tele. EKG unchanged  - TSH normal, A1c 5.9, Lipid profile with normal LDL - ASA 81mg  daily  - Lipitor 40mg  daily (new medication, but may not need as outpatient given his lipid profile)  - Awaiting stress test today by [facility] cardiology. Appreciate their consult. Will await further recommendations  # HTN- Stable - Continue home Clonidine, Metoprolol and Lisinopril  - Hydralazine prn for SBP >200 or DBP >120  - Monitor per floor protocol   # Anxiety- chronic anxiety disorder with recently increased stress. Will avoid increasing medication or prescribing new medication  - Continue home Ativan as needed for anxiety   # Chronic pain- Mostly in his back, but endorses pain all over. - Continue home Tramadol  - Avoid new narcotics, and we will not d/c with new prescriptions   # GERD- Pt states he used to take Zantac but does not have current symptoms  - Treat with Protonix while inpatient  # Tobacco abuse- Smokes 2-4 cigarettes/day  - Nurse to provide smoking cessation education   FEN/GI: Saline lock IV. NPO for stress test today then resume heart healthy diet PPx: Heparin subq  Dispo: Pending further work up and clinical improvement.    LOS: 1 day   [place], AMBER 05/30/2012, 7:11 AM

## 2012-05-31 NOTE — Discharge Summary (Signed)
I have reviewed this discharge summary and agree.    

## 2012-06-01 NOTE — Telephone Encounter (Signed)
Returned call to patient to schedule follow-up appt with [name].  Patient is supposed to have a "2-day stress test" with [facility] and wants appt next week.  Scheduled appt with [name] for 06/11/12 at 3:00 pm. Gaylene Brooks, RN

## 2012-06-02 ENCOUNTER — Telehealth: Payer: Self-pay | Admitting: Family Medicine

## 2012-06-02 DIAGNOSIS — I10 Essential (primary) hypertension: Secondary | ICD-10-CM

## 2012-06-02 MED ORDER — CLONIDINE HCL 0.2 MG PO TABS: 0.2000 mg | ORAL_TABLET | Freq: Two times a day (BID) | ORAL | Status: DC

## 2012-06-02 NOTE — ED Provider Notes (Signed)
Medical screening examination/treatment/procedure(s) were conducted as a shared visit with non-physician practitioner(s) and myself.  I personally evaluated the patient during the encounter   Shelda Jakes, MD 06/02/12 2218

## 2012-06-02 NOTE — Telephone Encounter (Signed)
Patient calling the emergency line due to blood pressure.  Seeing [name] on 06/11/12.  Patient has been taking clonidine 0.2mg  three times a day, as opposed to the twice a day he was discharged from the hospital with. He ran out of medicine, and has a refill available but it is too early for approval by the pharmacy.   Told patient that he needs to take clonidine 0.2mg  twice a day. I will send an Rx for clonidine 0.2mg  bid 20 tablets to hold patient over until next appointment.   Marena Chancy, PGY-2 Family Medicine Resident

## 2012-06-09 ENCOUNTER — Encounter (HOSPITAL_COMMUNITY): Payer: Self-pay

## 2012-06-11 ENCOUNTER — Encounter: Payer: Self-pay | Admitting: Family Medicine

## 2012-06-11 ENCOUNTER — Ambulatory Visit (INDEPENDENT_AMBULATORY_CARE_PROVIDER_SITE_OTHER): Payer: Self-pay | Admitting: Family Medicine

## 2012-06-11 ENCOUNTER — Ambulatory Visit: Payer: Self-pay | Admitting: Family Medicine

## 2012-06-11 VITALS — BP 138/96 | HR 105 | Temp 98.8°F | Ht 71.0 in | Wt 271.0 lb

## 2012-06-11 DIAGNOSIS — R112 Nausea with vomiting, unspecified: Secondary | ICD-10-CM

## 2012-06-11 DIAGNOSIS — M549 Dorsalgia, unspecified: Secondary | ICD-10-CM

## 2012-06-11 DIAGNOSIS — F17200 Nicotine dependence, unspecified, uncomplicated: Secondary | ICD-10-CM

## 2012-06-11 DIAGNOSIS — I10 Essential (primary) hypertension: Secondary | ICD-10-CM

## 2012-06-11 DIAGNOSIS — F4321 Adjustment disorder with depressed mood: Secondary | ICD-10-CM | POA: Insufficient documentation

## 2012-06-11 DIAGNOSIS — M48061 Spinal stenosis, lumbar region without neurogenic claudication: Secondary | ICD-10-CM

## 2012-06-11 DIAGNOSIS — G8929 Other chronic pain: Secondary | ICD-10-CM

## 2012-06-11 MED ORDER — CLONIDINE HCL 0.1 MG PO TABS
0.2000 mg | ORAL_TABLET | Freq: Once | ORAL | Status: AC
  Administered 2012-06-11: 0.2 mg via ORAL

## 2012-06-11 MED ORDER — TRAMADOL HCL 50 MG PO TABS: 50.0000 mg | ORAL_TABLET | Freq: Four times a day (QID) | ORAL | Status: DC | PRN

## 2012-06-11 MED ORDER — CLONIDINE HCL 0.2 MG PO TABS: 0.2000 mg | ORAL_TABLET | Freq: Two times a day (BID) | ORAL | Status: DC

## 2012-06-11 NOTE — Assessment & Plan Note (Signed)
  Grief  Patient seem to be doing well with the counseling he is receiving,he will continue this for now.

## 2012-06-11 NOTE — Patient Instructions (Signed)

## 2012-06-11 NOTE — Assessment & Plan Note (Signed)
  Nausea & vomiting: Improved  Diet counseling done,I suggested that he eats little at increase frequency. I will reassess him at next visit.

## 2012-06-11 NOTE — Assessment & Plan Note (Signed)
HTN: Uncontrolled Patient was given Clonidine 0.2 mg x1 at the clinic,his repeat BP improved. I refilled his Clonidine and encouraged him to take all other BP med as well as instructed. He is also instructed to check BP at home and keep a log.He will RTC next Monday for reassessment. i referred him to health coach.

## 2012-06-11 NOTE — Assessment & Plan Note (Signed)
  Smoking cessation  I commended him on quitting tobacco.

## 2012-06-11 NOTE — Assessment & Plan Note (Signed)
Back pain: Chronic/Spondylosis As discussed with patient,I prefer to gradually take him of pain med and have him try PT.He agreed with plan,I referred him to PT and reduced his Tramadol to 50mg  prn pain.I reviewed his previous xray and MRI,he does have mild spondylosis with lumbar stenosis.

## 2012-06-11 NOTE — Progress Notes (Signed)
Subjective:     Patient ID: Travis [name], male   DOB: [birth year], 47 y.o.   MRN: [id number]  HPI HTN:He was recently d/c from the hospital for elevated BP and chest pain,cardiac work up done was normal,he is scheduled for outpatient stress test,he has up coming cardiology appointment scheduled.Patient took all BP med this morning except his Clonidine which he ran out off since he was taking more than the prescribed dose. GI Problem:In the last few days after taking his medication he will feel nauseous and throw up his clonidine,hence he has been doubling up the dose of his clonidine,but now he is out of medication.he denies N/V at the moment,no change in his bowel movement. Grief: Father pass away on the 19th of March, buried yesterday,he is missing his father a lot and this is really affecting him.He had grief counseling at home by  His sister's  pastor every Sunday evening,this is really helping him a lot. Smoking:D/C yesterday.He smoked  2 sticks per day since 2024/03/24after the death of his father until yesterday when he quit all together . He had smoked for about 17 yrs in total and attempted 3 times to quit. Back pain:C/O low back pain constantly for years,this started after he had an accident at work 24 yrs ago.Pain is progressive with a severity of 10/10 most of the time,he has been on tramadol 100 mg qd which had helped but now he is out of his medication.Pain radiates to left LL occasionally,he denies any limb weakness or numbness.He had PT last 10 yrs ago.    Review of Systems  Respiratory: Negative.   Cardiovascular: Negative.   Gastrointestinal: Positive for nausea.  Musculoskeletal: Positive for back pain.  Neurological: Negative for dizziness, seizures and headaches.  Psychiatric/Behavioral: Negative for suicidal ideas and self-injury. The patient is not nervous/anxious.   All other systems reviewed and are negative.   Filed Vitals:   06/11/12 0952 06/11/12 0953  BP: 159/114  138/96  Pulse: 105   Temp: 98.8 F (37.1 C)   TempSrc: Oral   Height: 5\' 11"  (1.803 m)   Weight: 271 lb (122.925 kg)        Objective:   Physical Exam  Nursing note and vitals reviewed. Constitutional: He is oriented to person, place, and time. He appears well-developed. No distress.  Eyes: Pupils are equal, round, and reactive to light.  Cardiovascular: Normal rate, regular rhythm, normal heart sounds and intact distal pulses.   No murmur heard. Pulmonary/Chest: Effort normal and breath sounds normal. No respiratory distress. He has no wheezes.  Abdominal: Soft. Bowel sounds are normal. He exhibits no distension and no mass. There is no tenderness.  Musculoskeletal:       Lumbar back: He exhibits tenderness. He exhibits normal range of motion.  Neurological: He is alert and oriented to person, place, and time. He has normal strength. No cranial nerve deficit or sensory deficit.       Assessment:     HTN: Uncontrolled Nausea: Improved Grief Smoking cessation Back pain: Chronic/Spondylosis    Plan:     1. Patient was given Clonidine 0.2 mg x1 at the clinic,his repeat BP improved. I refilled his Clonidine and encouraged him to take all other BP med as well as instructed. He is also instructed to check BP at home and keep a log.He will RTC next Monday for reassessment. i referred him to health coach.  2. Diet counseling done,I suggested that he eats little at increase frequency. I will  reassess him at next visit.  3. Patient seem to be doing well with the counseling he is receiving,he will continue this for now.  4. I commended him on quitting tobacco.    5. As discussed with patient,I prefer to gradually take him of pain med and have him try PT.He agreed with plan,I referred him to PT and reduced his Tramadol to 50mg  prn pain.I reviewed his previous xray and MRI,he does have mild spondylosis with lumbar stenosis.

## 2012-06-16 ENCOUNTER — Telehealth: Payer: Self-pay | Admitting: Sports Medicine

## 2012-06-16 ENCOUNTER — Ambulatory Visit (INDEPENDENT_AMBULATORY_CARE_PROVIDER_SITE_OTHER): Payer: Self-pay | Admitting: Family Medicine

## 2012-06-16 ENCOUNTER — Encounter: Payer: Self-pay | Admitting: Family Medicine

## 2012-06-16 VITALS — BP 119/86 | HR 114 | Temp 98.5°F | Ht 71.0 in | Wt 278.0 lb

## 2012-06-16 DIAGNOSIS — M545 Low back pain, unspecified: Secondary | ICD-10-CM

## 2012-06-16 DIAGNOSIS — I10 Essential (primary) hypertension: Secondary | ICD-10-CM

## 2012-06-16 DIAGNOSIS — M549 Dorsalgia, unspecified: Secondary | ICD-10-CM

## 2012-06-16 DIAGNOSIS — F411 Generalized anxiety disorder: Secondary | ICD-10-CM

## 2012-06-16 DIAGNOSIS — F410 Panic disorder [episodic paroxysmal anxiety] without agoraphobia: Secondary | ICD-10-CM

## 2012-06-16 NOTE — ED Provider Notes (Signed)
Medical screening examination/treatment/procedure(s) were conducted as a shared visit with non-physician practitioner(s) and myself.  I personally evaluated the patient during the encounter    Shelda Jakes, MD 06/16/12 1256

## 2012-06-16 NOTE — Assessment & Plan Note (Signed)
HTN BP improved a lot from last visit,continue current dose of Clonidine,metoprolol and lisinopril.

## 2012-06-16 NOTE — Progress Notes (Signed)
Subjective:     Patient ID: Travis [name], male   DOB: 1965-06-01, 47 y.o.   MRN: [id number]  HPI HTN;He is here for follow up with his BP,compliant with Clonidine,Metoprolol and Lisinopril. Back pain:Patient is here for follow up,his Tramadol was reduced to 50 mg q6hr from last visit,but he went back on 100 mg q6hrs since this was not helping,he currently have a pain of 9/10 in severity. He stated he will like to get back on tramadol 100 mg and he is running out of his med since he has been taking 2 tab q6hr. He mentioned he will like to change his pharmacy to [facility] which is cheaper. Panic attack/Anxiety:Here for follow up for chronic anxiety,he has been on Ativan,he is requesting refill.  Past Medical History  Diagnosis Date  . Hypertension   . Anxiety   . Tachycardia - pulse   . Degenerative disk disease   . Spinal stenosis   . Degenerative disk disease   . Tobacco abuse   . Obesity   . Dyslipidemia       Review of Systems  Constitutional: Negative for fever.  Respiratory: Negative.   Cardiovascular: Negative.   Gastrointestinal: Negative.   Musculoskeletal: Positive for back pain.  Psychiatric/Behavioral: Negative for suicidal ideas, sleep disturbance and self-injury. The patient is nervous/anxious.   All other systems reviewed and are negative.   Filed Vitals:   06/16/12 1119  BP: 119/86  Pulse: 114  Temp: 98.5 F (36.9 C)  TempSrc: Oral  Height: 5\' 11"  (1.803 m)  Weight: 278 lb (126.1 kg)       Objective:   Physical Exam  Nursing note and vitals reviewed. Constitutional: He is oriented to person, place, and time. He appears well-developed. No distress.  Cardiovascular: Normal rate, regular rhythm, normal heart sounds and intact distal pulses.   No murmur heard. Pulmonary/Chest: Effort normal and breath sounds normal. No respiratory distress. He has no wheezes.  Abdominal: Soft.  Musculoskeletal: Normal range of motion. He exhibits no edema.       Lumbar  back: Normal.  Neurological: He is alert and oriented to person, place, and time. He has normal reflexes.  Psychiatric: He has a normal mood and affect. His behavior is normal. Judgment and thought content normal.       Assessment:     HTN Back pain: Chronic lumbago Panic attack/Anxiety: Currently asymptomatic     Plan:     1. BP improved a lot from last visit,continue current dose of Clonidine,metoprolol and lisinopril.  2. Patient seem to be taking too much pain medicine,as per patient he is not doing well on Tramadol 50 mg,I will change him back to tramadol 100 mg,have him start PT. He had xray back few months ago which showed mild lumbar spondylosis. If patient continues not to do well on med,I will refer to pain clinic for trial of intra-articular injection. I will refill his med whenever he calls for his new pharmacy address and number.  3. As discussed with patient,I do not intend to continue him on  Ativan,I initially planned to gradually taper him off. Urine drug screen checked today. I reviewed his previous urine drug screen in March,he was positive for cannabis.This was discussed with him and I made it clear I will not be filling his Ativan since his utox was abnormal,I prefer to refer to Psychiatrist,he agreed with plan.

## 2012-06-16 NOTE — Patient Instructions (Addendum)

## 2012-06-16 NOTE — Assessment & Plan Note (Signed)
  Panic attack/Anxiety: Currently asymptomatic   As discussed with patient,I do not intend to continue him on  Ativan,I initially planned to gradually taper him off. Urine drug screen checked today. I reviewed his previous urine drug screen in March,he was positive for cannabis.This was discussed with him and I made it clear I will not be filling his Ativan since his utox was abnormal,I prefer to refer to Psychiatrist,he agreed with plan.

## 2012-06-16 NOTE — Assessment & Plan Note (Signed)
  Back pain: Chronic lumbago  Patient seem to be taking too much pain medicine,as per patient he is not doing well on Tramadol 50 mg,I will change him back to tramadol 100 mg,have him start PT. He had xray back few months ago which showed mild lumbar spondylosis. If patient continues not to do well on med,I will refer to pain clinic for trial of intra-articular injection. I will refill his med whenever he calls for his new pharmacy address and number.

## 2012-06-16 NOTE — Telephone Encounter (Signed)
[facility] 24 Hour After-hours Emergency Line  Patient name: Travis [name] Medical record number: [id number] Date of birth: 1965/06/21 Age: 47 y.o. Gender: male  Primary Care Provider: [name], MD    Patient culture for emergency line following visit with his new PCP [name].  Voices concern for having a seizure following being stopped on his lorazepam.  He is demonstrating no symptoms at this time concerned disease been on this medication for a long time.  Upon further review is noted he has been on 2 mg of Ativan daily.  Reassured him that abrupt cessation at this dose is not likely to cause a seizure.  Reviewed signs and symptoms for withdrawal/DTs  Patient did have some further questions regarding urine drug screen that showed positive for cannabis.  He denies smoking marijuana however has been around people who were smoking and believes this is may be why his drug screen is positive.  Disposition  I reassured the patient and encouraged him to schedule an appointment with a psychiatrist as requested by [name].   Encouraged him to continue routine followup with [name] as discussed earlier today.   Andrena Mews, DO [facility] Resident - PGY-2  06/16/2012 8:45 PM

## 2012-06-17 ENCOUNTER — Telehealth: Payer: Self-pay | Admitting: Family Medicine

## 2012-06-17 LAB — DRUG SCR UR, PAIN MGMT, REFLEX CONF
Creatinine,U: 699.23 mg/dL
Marijuana Metabolite: NEGATIVE
Methadone: NEGATIVE
Opiates: NEGATIVE
Propoxyphene: NEGATIVE

## 2012-06-17 MED ORDER — TRAMADOL HCL 50 MG PO TABS: 100.0000 mg | ORAL_TABLET | Freq: Four times a day (QID) | ORAL | Status: DC | PRN

## 2012-06-17 NOTE — Telephone Encounter (Signed)
Patient is calling back not happy that he isn't getting his pain medicine today.  He doesn't have enough to last until 4/22 and he really wants to talk to [name] or make an appt to be seen tomorrow, but [name] doesn't have an opening tomorrow.  I could schedule him with another doctor, but I don't think that would help this situation considering all of the previous documentation from yesterday and today alone.

## 2012-06-17 NOTE — Telephone Encounter (Signed)
Patient calling emergency line.  He is calling regarding his tramadol. He informs me that [name] refilled his tramadol on 06/11/12 and that he will be out of tramadol by Friday the 18th of April but doesn't have a refill until 06/23/12. He takes 8 tablets of tramadol 50mg  per day and will run out by 06/19/12. He would like me to transmit the message to [name].  Will forward message to [name]. I also told him he could call the office tomorrow during office hours.   Marena Chancy, PGY-2 Family Medicine Resident

## 2012-06-17 NOTE — Telephone Encounter (Signed)
I made it clear to patient I will not fill med since urine test was positive,recheck neg yesterday but pos for amphetamine. Ativan was started by Dr Shawnie Pons which I think should not have been started in the first place. I got a message from Dr Shawnie Pons that patient has drug seeking behavior hence she will not continue his Ativan. Below is the message from Dr Shawnie Pons.  If patient becomes symptomatic to go to the ER pending psychiatry appointment.  Reva Bores, MD at 05/29/2012 9:50 AM  Status: Signed          It seems like pt. Continues to have issues with medication. Several different family members have called in with complaints. He also calls or comes in with stories of losing his medication and prescriptions, them being stolen, etc. I will stop refilling his medication at this point. He is exhibiting drug seeking behavior. Chart flagged.

## 2012-06-17 NOTE — Telephone Encounter (Signed)
I e-scribed his tramadol today,i called the pharmacy, I was informed he got  120 tablets on 06/02/12 and then 60 tablets on 06/11/12,according to calculation he should still have pills to last him till 06/23/12,pharmacy will not release medication till then.

## 2012-06-17 NOTE — Telephone Encounter (Signed)
Patient informed.  Gaylene Brooks, RN

## 2012-06-17 NOTE — Telephone Encounter (Signed)
Patient is calling after speaking to [name] this morning with a complaint about not being able to get his Tramadol tomorrow.  He wants to speak to the Triage nurse but there is an encounter from the conversation with [name] from their previous conversation that needs to be looked at first.

## 2012-06-17 NOTE — Telephone Encounter (Signed)
I will call his pharmacy tomorrow to refill med.

## 2012-06-17 NOTE — Telephone Encounter (Signed)
I made it clear to patient I will not fill med since urine test was positive,recheck neg yesterday but pos for amphetamine. Ativan was started by Dr [name] which I think should not have been started in the first place. I got a message from Dr [name] that patient has drug seeking behavior hence she will not continue his Ativan. Below is the message from Dr [name].  If patient becomes symptomatic to go to the ER pending psychiatry appointment.  Travis S Pratt, MD at 05/29/2012 9:50 AM  Status: Signed          It seems like pt. Continues to have issues with medication. Several different family members have called in with complaints. He also calls or comes in with stories of losing his medication and prescriptions, them being stolen, etc. I will stop refilling his medication at this point. He is exhibiting drug seeking behavior. Chart flagged.   

## 2012-06-18 ENCOUNTER — Telehealth: Payer: Self-pay | Admitting: Family Medicine

## 2012-06-18 NOTE — Telephone Encounter (Signed)
I spoke with patient,he may pick up Tramadol at the pharmacy today,I did warned him to take it sparingly and if he run out of Med before May17th it will not be refilled and I will not be seeing him again,he agreed with plan. He also mentioned he scheduled appointment for next Tuesday at [facility] for his anxiety management.

## 2012-06-18 NOTE — Telephone Encounter (Signed)
Pt is asking about his meds- concerned because we are closed tomorrow

## 2012-06-19 LAB — BENZODIAZEPINES (GC/LC/MS), URINE
Alprazolam (GC/LC/MS), ur confirm: NEGATIVE ng/mL
Alprazolam metabolite (GC/LC/MS), ur confirm: 89 ng/mL
Estazolam (GC/LC/MS), ur confirm: NEGATIVE ng/mL
Flunitrazepam metabolite (GC/LC/MS), ur confirm: NEGATIVE ng/mL
Flurazepam metabolite (GC/LC/MS), ur confirm: NEGATIVE ng/mL
Midazolam (GC/LC/MS), ur confirm: NEGATIVE ng/mL

## 2012-06-19 LAB — AMPHETAMINES (GC/LC/MS), URINE
Amphetamine GC/MS Conf: NEGATIVE ng/mL
Methamphetamine Quant, Ur: NEGATIVE ng/mL

## 2012-06-20 ENCOUNTER — Encounter: Payer: Self-pay | Admitting: Cardiology

## 2012-06-20 DIAGNOSIS — M4800 Spinal stenosis, site unspecified: Secondary | ICD-10-CM | POA: Insufficient documentation

## 2012-06-20 DIAGNOSIS — I10 Essential (primary) hypertension: Secondary | ICD-10-CM | POA: Insufficient documentation

## 2012-06-20 DIAGNOSIS — E785 Hyperlipidemia, unspecified: Secondary | ICD-10-CM | POA: Insufficient documentation

## 2012-06-20 DIAGNOSIS — E669 Obesity, unspecified: Secondary | ICD-10-CM | POA: Insufficient documentation

## 2012-06-20 DIAGNOSIS — F1910 Other psychoactive substance abuse, uncomplicated: Secondary | ICD-10-CM | POA: Insufficient documentation

## 2012-06-20 DIAGNOSIS — Z720 Tobacco use: Secondary | ICD-10-CM | POA: Insufficient documentation

## 2012-06-20 DIAGNOSIS — R079 Chest pain, unspecified: Secondary | ICD-10-CM | POA: Insufficient documentation

## 2012-06-20 DIAGNOSIS — F419 Anxiety disorder, unspecified: Secondary | ICD-10-CM | POA: Insufficient documentation

## 2012-06-22 ENCOUNTER — Encounter: Payer: Self-pay | Admitting: Cardiology

## 2012-06-22 ENCOUNTER — Encounter: Payer: Self-pay | Admitting: *Deleted

## 2012-06-22 ENCOUNTER — Ambulatory Visit (INDEPENDENT_AMBULATORY_CARE_PROVIDER_SITE_OTHER): Payer: Self-pay | Admitting: Cardiology

## 2012-06-22 VITALS — BP 132/88 | HR 79 | Ht 70.0 in | Wt 281.8 lb

## 2012-06-22 DIAGNOSIS — R079 Chest pain, unspecified: Secondary | ICD-10-CM

## 2012-06-22 DIAGNOSIS — I10 Essential (primary) hypertension: Secondary | ICD-10-CM

## 2012-06-22 NOTE — Assessment & Plan Note (Signed)
Blood pressure is controlled today. No change in therapy. 

## 2012-06-22 NOTE — Patient Instructions (Addendum)
Your physician recommends that you schedule a follow-up appointment in: AS NEEDED  Your physician recommends that you continue on your current medications as directed. Please refer to the Current Medication list given to you today.  

## 2012-06-22 NOTE — Progress Notes (Signed)
HPI  The patient is seen today for a post hospital visit for the evaluation of chest pain. He was seen in consultation at the hospital May 29, 2012. His father had recently passed away and he had significant stress and grief. He had chest pain and was admitted to the hospital. There was no evidence of an MI. The plan had been to try to follow through with an in hospital stress test. The patient decided that he did not want this. The next plan was to followup with an outpatient stress test. This was scheduled but the patient canceled and did not reschedule.  Patient returns today and he feels well. He is not having any chest discomfort.  Allergies  Allergen Reactions  . Blueberry Flavor Hives  . Penicillins Hives, Itching and Other (See Comments)    Hallucinations.  . Toradol (Ketorolac Tromethamine) Hives and Other (See Comments)    Headache    Current Outpatient Prescriptions  Medication Sig Dispense Refill  . aspirin EC 81 MG EC tablet Take 1 tablet (81 mg total) by mouth daily.      . cloNIDine (CATAPRES) 0.2 MG tablet Take 1 tablet (0.2 mg total) by mouth 2 (two) times daily.  60 tablet  3  . lisinopril (PRINIVIL,ZESTRIL) 20 MG tablet Take 1 tablet (20 mg total) by mouth daily.  30 tablet  1  . lovastatin (MEVACOR) 20 MG tablet Take 20 mg by mouth at bedtime.      . metoprolol tartrate (LOPRESSOR) 25 MG tablet Take 1 tablet (25 mg total) by mouth 2 (two) times daily.  60 tablet  0  . ranitidine (ZANTAC) 150 MG tablet Take 150 mg by mouth 2 (two) times daily.      . traMADol (ULTRAM) 50 MG tablet Take 2 tablets (100 mg total) by mouth every 6 (six) hours as needed for pain.  120 tablet  0   No current facility-administered medications for this visit.    History   Social History  . Marital Status: Divorced    Spouse Name: N/A    Number of Children: N/A  . Years of Education: N/A   Occupational History  . Not on file.   Social History Main Topics  . Smoking status: Light  Tobacco Smoker -- 0.25 packs/day    Types: Cigarettes    Last Attempt to Quit: 06/10/2012  . Smokeless tobacco: Not on file  . Alcohol Use: No  . Drug Use: No  . Sexually Active: No   Other Topics Concern  . Not on file   Social History Narrative  . No narrative on file    Family History  Problem Relation Age of Onset  . Stroke Mother   . Hypertension Mother   . Stroke Father   . Hypertension Father   . Dementia Father   . Diabetes Father   . Heart disease Father   . Cancer Sister     brain  . Heart disease Brother     Past Medical History  Diagnosis Date  . Hypertension   . Anxiety   . Tachycardia - pulse   . Degenerative disk disease   . Spinal stenosis   . Degenerative disk disease   . Tobacco abuse   . Obesity   . Polysubstance abuse   . Chest pain     Hospital, March, 2014, negative enzymes, patient refused in-hospital  stress test, patient canceled outpatient stress test    History reviewed. No pertinent past surgical history.  Patient Active Problem List  Diagnosis  . HYPERCHOLESTEROLEMIA  . DEPRESSION  . GERD  . HEADACHE  . Panic attack  . Uncontrolled hypertension  . Grief  . Hypertension  . Anxiety  . Spinal stenosis  . Tobacco abuse  . Obesity  . Dyslipidemia  . Polysubstance abuse  . Chest pain    ROS   Patient denies fever, chills, headache, sweats, rash, change in vision, change in hearing, chest pain, cough, nausea vomiting, urinary symptoms. All other systems are reviewed and are negative.  PHYSICAL EXAM  The patient is overweight but greatly improved from the past. There is no jugulovenous distention. Lungs are clear. Respiratory effort is not labored. Cardiac exam reveals S1 and S2. There no clicks or significant murmurs. The abdomen is soft. There is no peripheral edema.  Filed Vitals:   06/22/12 1614  BP: 132/88  Pulse: 79  Height: 5\' 10"  (1.778 m)  Weight: 281 lb 12.8 oz (127.824 kg)   EKG is done today and reviewed by  me. It is completely normal.  ASSESSMENT & PLAN

## 2012-06-22 NOTE — Assessment & Plan Note (Signed)
The patient had some chest discomfort under very stressful situation. Enzymes in the hospital were negative. There was never any EKG change. His symptoms have resolved. The patient has been hesitant to proceed with an exercise test. He is not having any problems at this time. We have decided to watch him over time. If he has any recurrent symptoms I would be happy to reevaluate him at any time. At this point he has no definite proof of ischemic heart disease.

## 2012-06-27 ENCOUNTER — Telehealth: Payer: Self-pay | Admitting: Family Medicine

## 2012-06-27 NOTE — Telephone Encounter (Signed)
Patient calling the emergency line about his clonidine stating that he received 30 tablets instead of the 60 tablets that were prescribed on 06/11/12.  Called the pharmacy to authorize a refill now. Pharmacy agreed with this.  Patient expressed understanding.   Marena Chancy, PGY-2 Family Medicine Resident

## 2012-07-01 ENCOUNTER — Encounter (HOSPITAL_COMMUNITY): Payer: Self-pay

## 2012-07-02 ENCOUNTER — Ambulatory Visit: Payer: Self-pay | Admitting: Family Medicine

## 2012-07-09 ENCOUNTER — Encounter (HOSPITAL_COMMUNITY): Payer: Self-pay | Admitting: *Deleted

## 2012-07-09 ENCOUNTER — Emergency Department (HOSPITAL_COMMUNITY)
Admission: EM | Admit: 2012-07-09 | Discharge: 2012-07-09 | Disposition: A | Discharge status: Home / Self Care | Payer: Self-pay | Attending: Emergency Medicine | Admitting: Emergency Medicine

## 2012-07-09 DIAGNOSIS — Z8679 Personal history of other diseases of the circulatory system: Secondary | ICD-10-CM | POA: Insufficient documentation

## 2012-07-09 DIAGNOSIS — K047 Periapical abscess without sinus: Secondary | ICD-10-CM

## 2012-07-09 DIAGNOSIS — K0889 Other specified disorders of teeth and supporting structures: Secondary | ICD-10-CM

## 2012-07-09 DIAGNOSIS — Z880 Allergy status to penicillin: Secondary | ICD-10-CM | POA: Insufficient documentation

## 2012-07-09 DIAGNOSIS — F411 Generalized anxiety disorder: Secondary | ICD-10-CM | POA: Insufficient documentation

## 2012-07-09 DIAGNOSIS — Z79899 Other long term (current) drug therapy: Secondary | ICD-10-CM | POA: Insufficient documentation

## 2012-07-09 DIAGNOSIS — E669 Obesity, unspecified: Secondary | ICD-10-CM | POA: Insufficient documentation

## 2012-07-09 DIAGNOSIS — Z8739 Personal history of other diseases of the musculoskeletal system and connective tissue: Secondary | ICD-10-CM | POA: Insufficient documentation

## 2012-07-09 DIAGNOSIS — I10 Essential (primary) hypertension: Secondary | ICD-10-CM | POA: Insufficient documentation

## 2012-07-09 DIAGNOSIS — K089 Disorder of teeth and supporting structures, unspecified: Secondary | ICD-10-CM | POA: Insufficient documentation

## 2012-07-09 DIAGNOSIS — K044 Acute apical periodontitis of pulpal origin: Secondary | ICD-10-CM | POA: Insufficient documentation

## 2012-07-09 DIAGNOSIS — Z7982 Long term (current) use of aspirin: Secondary | ICD-10-CM | POA: Insufficient documentation

## 2012-07-09 DIAGNOSIS — F17200 Nicotine dependence, unspecified, uncomplicated: Secondary | ICD-10-CM | POA: Insufficient documentation

## 2012-07-09 DIAGNOSIS — K029 Dental caries, unspecified: Secondary | ICD-10-CM | POA: Insufficient documentation

## 2012-07-09 MED ORDER — CLINDAMYCIN HCL 150 MG PO CAPS: 150.0000 mg | ORAL_CAPSULE | Freq: Four times a day (QID) | ORAL | Status: DC

## 2012-07-09 MED ORDER — HYDROCODONE-ACETAMINOPHEN 5-325 MG PO TABS: ORAL_TABLET | ORAL | Status: DC

## 2012-07-09 NOTE — ED Notes (Addendum)
Left side: Upper, lateral, front tooth is broken. It is infected. Root turn side ways. Broke last night. Been infected before. Severe pain. Appt. With dental clinic next week.

## 2012-07-09 NOTE — ED Provider Notes (Signed)
History    This chart was scribed for non-physician practitioner Junius Finner working with Gerhard Munch, MD by [name], ED Scribe. This patient was seen in room TR05C/TR05C and the patient's care was started at 11:22 PM .   CSN: [id number]  Arrival date & time 07/09/12  2150      Chief Complaint  Patient presents with  . Dental Pain     The history is provided by the patient. No language interpreter was used.    HPI Comments: Travis [name] is a 47 y.o. male who presents to the Emergency Department complaining of severe, gradual-onset, gradually-worsening dental pain associated with a dental injury he sustained last night.  Pt states that last night while he was eating he felt a tooth pop, and subsequently noticed the tooth sticking out sideways.  He has had infection in the area for some time, with associated mild pain, but states that the pain has become much more severe since last night.  Pt has not received antibiotic treatment for the infection.  Pt took ibuprofen at home, which failed to relieve pain.  Pt denies fever, chills, neck pain, sore throat, CP, cough, SOB, abdominal pain, nausea, emesis, diarrhea, urinary symptoms, HA, weakness, numbness or any other associated symptoms.  He has a dental appointment next week.   PCP is [name]. PCP is allergic to Penicillin and Toradol.   Past Medical History  Diagnosis Date  . Hypertension   . Anxiety   . Tachycardia - pulse   . Degenerative disk disease   . Spinal stenosis   . Degenerative disk disease   . Tobacco abuse   . Obesity   . Polysubstance abuse   . Chest pain     Hospital, March, 2014, negative enzymes, patient refused in-hospital  stress test, patient canceled outpatient stress test    History reviewed. No pertinent past surgical history.  Family History  Problem Relation Age of Onset  . Stroke Mother   . Hypertension Mother   . Stroke Father   . Hypertension Father   . Dementia Father   .  Diabetes Father   . Heart disease Father   . Cancer Sister     brain  . Heart disease Brother     History  Substance Use Topics  . Smoking status: Light Tobacco Smoker -- 0.25 packs/day    Types: Cigarettes    Last Attempt to Quit: 06/10/2012  . Smokeless tobacco: Not on file  . Alcohol Use: No      Review of Systems  Constitutional: Negative for fever and chills.  HENT: Positive for dental problem. Negative for sore throat and neck pain.   Respiratory: Negative for cough and shortness of breath.   Cardiovascular: Negative for chest pain.  Gastrointestinal: Negative for nausea, vomiting, abdominal pain and diarrhea.  Genitourinary: Negative for dysuria and difficulty urinating.  Neurological: Negative for weakness, numbness and headaches.  All other systems reviewed and are negative.    Allergies  Blueberry flavor; Penicillins; and Toradol  Home Medications   Current Outpatient Rx  Name  Route  Sig  Dispense  Refill  . aspirin 81 MG EC tablet   Oral   Take 81 mg by mouth daily.         . cloNIDine (CATAPRES) 0.2 MG tablet   Oral   Take 0.2 mg by mouth 3 (three) times daily.         Marland Kitchen lisinopril (PRINIVIL,ZESTRIL) 20 MG tablet   Oral  Take 1 tablet (20 mg total) by mouth daily.   30 tablet   1   . lovastatin (MEVACOR) 20 MG tablet   Oral   Take 20 mg by mouth at bedtime.         . metoprolol tartrate (LOPRESSOR) 25 MG tablet   Oral   Take 1 tablet (25 mg total) by mouth 2 (two) times daily.   60 tablet   0   . ranitidine (ZANTAC) 150 MG tablet   Oral   Take 150 mg by mouth 2 (two) times daily.         . traMADol (ULTRAM) 50 MG tablet   Oral   Take 100 mg by mouth every 6 (six) hours as needed for pain.         . clindamycin (CLEOCIN) 150 MG capsule   Oral   Take 1 capsule (150 mg total) by mouth every 6 (six) hours.   28 capsule   0   . HYDROcodone-acetaminophen (NORCO/VICODIN) 5-325 MG per tablet      Take 1-2 tabs every 6 hours  as needed for pain.   6 tablet   0     BP 167/113  Pulse 120  Temp(Src) 98.7 F (37.1 C) (Oral)  Resp 18  SpO2 98%  Physical Exam  Nursing note and vitals reviewed. Constitutional: He is oriented to person, place, and time. He appears well-developed and well-nourished. No distress.  HENT:  Head: Normocephalic and atraumatic. No trismus in the jaw.  Mouth/Throat: Uvula is midline, oropharynx is clear and moist and mucous membranes are normal. He does not have dentures. No oral lesions. Abnormal dentition. Dental abscesses and dental caries present. No edematous or lacerations. No oropharyngeal exudate, posterior oropharyngeal edema, posterior oropharyngeal erythema or tonsillar abscesses.    Extensive gingivitis with dental caries and multiple missing teeth with exposed roots.   Eyes: EOM are normal.  Neck: Normal range of motion. Neck supple. No tracheal deviation present.  Cardiovascular: Normal rate and normal heart sounds.   Pulmonary/Chest: Effort normal and breath sounds normal. No respiratory distress. He has no wheezes. He has no rales.  Musculoskeletal: Normal range of motion.  Neurological: He is alert and oriented to person, place, and time.  Skin: Skin is warm and dry.  Psychiatric: He has a normal mood and affect. His behavior is normal.    ED Course  Procedures (including critical care time)  DIAGNOSTIC STUDIES: Oxygen Saturation is 98% on room air, normal by my interpretation.    COORDINATION OF CARE: 11:26 PM-Discussed treatment plan which includes pain medication and antibiotics with pt at bedside and pt agreed to plan.      Labs Reviewed - No data to display No results found.   1. Pain, dental   2. Dental infection       MDM  Pt with extensive dental caries and gingivitis c/o increased dental pain.  Has had dental infection in the past.  He has appointment with dental clinic next week.  Needs something for pain to help him sleep. Allergic to  PCN.  Pt is willing to be seen sooner if possible.   Rx: clindamycin and norco.  Follow up with Dr. Leanord Asal in 24-48hrs or keep appointment with other dentist previously scheduled for Thursday 07/16/12 I personally performed the services described in this documentation, which was scribed in my presence. The recorded information has been reviewed and is accurate.    Junius Finner, PA-C 07/10/12 1725

## 2012-07-10 NOTE — ED Provider Notes (Signed)
  Medical screening examination/treatment/procedure(s) were performed by non-physician practitioner and as supervising physician I was immediately available for consultation/collaboration.    Taziyah Iannuzzi, MD 07/10/12 1905 

## 2012-07-14 ENCOUNTER — Encounter: Payer: Self-pay | Admitting: Family Medicine

## 2012-07-14 ENCOUNTER — Ambulatory Visit (INDEPENDENT_AMBULATORY_CARE_PROVIDER_SITE_OTHER): Payer: Self-pay | Admitting: Family Medicine

## 2012-07-14 VITALS — BP 140/98 | HR 84 | Temp 98.4°F | Ht 70.0 in | Wt 283.0 lb

## 2012-07-14 DIAGNOSIS — I10 Essential (primary) hypertension: Secondary | ICD-10-CM

## 2012-07-14 DIAGNOSIS — F4321 Adjustment disorder with depressed mood: Secondary | ICD-10-CM

## 2012-07-14 DIAGNOSIS — K047 Periapical abscess without sinus: Secondary | ICD-10-CM | POA: Insufficient documentation

## 2012-07-14 DIAGNOSIS — M4800 Spinal stenosis, site unspecified: Secondary | ICD-10-CM

## 2012-07-14 DIAGNOSIS — G8929 Other chronic pain: Secondary | ICD-10-CM

## 2012-07-14 DIAGNOSIS — M549 Dorsalgia, unspecified: Secondary | ICD-10-CM

## 2012-07-14 MED ORDER — METOPROLOL TARTRATE 25 MG PO TABS: 25.0000 mg | ORAL_TABLET | Freq: Two times a day (BID) | ORAL | Status: DC

## 2012-07-14 MED ORDER — TRAMADOL HCL 50 MG PO TABS: 100.0000 mg | ORAL_TABLET | Freq: Four times a day (QID) | ORAL | Status: DC | PRN

## 2012-07-14 MED ORDER — LISINOPRIL 20 MG PO TABS: 20.0000 mg | ORAL_TABLET | Freq: Every day | ORAL | Status: DC

## 2012-07-14 NOTE — Patient Instructions (Addendum)

## 2012-07-14 NOTE — Assessment & Plan Note (Signed)
  Grief;     Counseling done briefly and I agree he may continue to see his pastor for grief counseling since he does better with him.

## 2012-07-14 NOTE — Assessment & Plan Note (Signed)
HTN: BP improved a lot,I refilled his Lisinopril and Metoprolol today.

## 2012-07-14 NOTE — Assessment & Plan Note (Signed)
  Back pain: Spinal stenosis  He has up coming PT appointment,I recommended he follow up with them.     I refilled his Tramadol which he will pick up May 17th.     I also suggested second opinion for his back pain with the sport medicine doc,he agreed and will like to try this,I referred him today.     Consider MRI back if PT is not helpful.

## 2012-07-14 NOTE — Assessment & Plan Note (Signed)
  Toothache:Dental abscess  Continue dentist recommendation. To complete Cleocin and f/u as instructed.

## 2012-07-14 NOTE — Progress Notes (Signed)
Subjective:     Patient ID: Travis [name], male   DOB: [birth year], 47 y.o.   MRN: [id number]  HPI WUJ:[id number] has been taking Clonidine 0.2 mg BID,Metoprolol 25 mg BID and Lisinopril 20 mg qd,doing well at home and his BP has been running between 119/70-120/76. He denies any other concern. Back pain:He continues to have low back pain controlled with tramadol,pain radiates to his right hip at times,he is wondering if he can get an MRI of the back.He denies bowel or bladder incontinence,no limb weakness or numbness. Toothache:He had tooth pain last week for which he saw dentist who started him on antibiotic and pain med,his tooth was also pulled,he feels much better today. Grief;Patient stated he just lost his baby sister and his aunt hence he started to smoke tobacco again. He has been receiving counseling from his pastor. He also attended grief counseling meeting with Dr Ladona Ridgel at [place] but prefer not to go back to him again,he stated he feels better now and will rather talk to his pastor.  Current Outpatient Prescriptions on File Prior to Visit  Medication Sig Dispense Refill  . aspirin 81 MG EC tablet Take 81 mg by mouth daily.      . clindamycin (CLEOCIN) 150 MG capsule Take 1 capsule (150 mg total) by mouth every 6 (six) hours.  28 capsule  0  . cloNIDine (CATAPRES) 0.2 MG tablet Take 0.2 mg by mouth 2 (two) times daily.       Marland Kitchen lovastatin (MEVACOR) 20 MG tablet Take 20 mg by mouth at bedtime.      . ranitidine (ZANTAC) 150 MG tablet Take 150 mg by mouth 2 (two) times daily.      . traMADol (ULTRAM) 50 MG tablet Take 100 mg by mouth every 6 (six) hours as needed for pain.       No current facility-administered medications on file prior to visit.     Past Medical History  Diagnosis Date  . Hypertension   . Anxiety   . Tachycardia - pulse   . Degenerative disk disease   . Spinal stenosis   . Degenerative disk disease   . Tobacco abuse   . Obesity   . Polysubstance abuse   . Chest  pain     Hospital, March, 2014, negative enzymes, patient refused in-hospital  stress test, patient canceled outpatient stress test      Review of Systems  Constitutional: Negative for fever.  HENT: Positive for dental problem.   Respiratory: Negative.   Cardiovascular: Negative.   Gastrointestinal: Negative.   Musculoskeletal: Positive for back pain.  Neurological: Negative.   All other systems reviewed and are negative.    Filed Vitals:   07/14/12 1012  BP: 140/98  Pulse: 84  Temp: 98.4 F (36.9 C)  TempSrc: Oral  Height: 5\' 10"  (1.778 m)  Weight: 283 lb (128.368 kg)        Objective:   Physical Exam  Nursing note and vitals reviewed. Constitutional: He is oriented to person, place, and time. He appears well-developed. No distress.  Cardiovascular: Normal rate, regular rhythm, normal heart sounds and intact distal pulses.   No murmur heard. Pulmonary/Chest: Effort normal and breath sounds normal. No respiratory distress. He has no wheezes.  Abdominal: Soft. Bowel sounds are normal. He exhibits no distension and no mass. There is no tenderness. There is no rebound.  Musculoskeletal: He exhibits no edema.       Lumbar back: He exhibits decreased range of motion and tenderness.  Neurological: He is alert and oriented to person, place, and time. No cranial nerve deficit.  Psychiatric: He has a normal mood and affect. His behavior is normal.       Assessment:     HTN: Back pain: Toothache: Grief;    Plan:     1. BP improved a lot,I refilled his Lisinopril and Metoprolol today.  2. He has up coming PT appointment,I recommended he follow up with them.     I refilled his Tramadol which he will pick up May 17th.     I also suggested second opinion for his back pain with the sport medicine doc,he agreed and will like to try this,I referred him today.     Consider MRI back if PT is not helpful.  3. Continue dentist recommendation. To complete Cleocin and f/u as  instructed.  4. Counseling done briefly and I agree he may continue to see his pastor for grief counseling since he does better with him.

## 2012-07-21 ENCOUNTER — Telehealth: Payer: Self-pay | Admitting: Family Medicine

## 2012-07-21 MED ORDER — CLONIDINE HCL 0.2 MG PO TABS: 0.2000 mg | ORAL_TABLET | Freq: Two times a day (BID) | ORAL | Status: DC

## 2012-07-21 MED ORDER — TRAMADOL HCL 50 MG PO TABS: 100.0000 mg | ORAL_TABLET | Freq: Three times a day (TID) | ORAL | Status: DC | PRN

## 2012-07-21 NOTE — Telephone Encounter (Signed)
meds routed to MD for refill at [facility] . Wyatt Haste, RN-BSN

## 2012-07-21 NOTE — Telephone Encounter (Signed)
Patient is calling because he has tried to have [facility] call about Clonidine and Tramadol and there was a reply sent back to the pharmacy that said "Patient Unknown", so now he can't get any of his refills.

## 2012-07-21 NOTE — Telephone Encounter (Signed)
Refill completed.

## 2012-07-22 ENCOUNTER — Telehealth: Payer: Self-pay | Admitting: Family Medicine

## 2012-07-22 NOTE — Telephone Encounter (Signed)
Pt is needing to talk to nurse about why his Tramadol was changed from taking it every 6 hrs to every 8 hrs - he really needs this so that when he runs out he can get it filled without any problems.  He is continuing to take it every 6 hrs.

## 2012-07-22 NOTE — Telephone Encounter (Signed)
He states that he only got #30 because he didn't have enough money to get the 120 - needs for [place] to call pharmacy to let them know to change to every 6 hrs so he can get the rest when needed.

## 2012-07-22 NOTE — Telephone Encounter (Signed)
Will FWD to MD for clarification.  Garielle Mroz, Darlyne Russian, CMA

## 2012-07-22 NOTE — Telephone Encounter (Signed)
Thanks

## 2012-07-22 NOTE — Telephone Encounter (Signed)
Rx written yesterday was for quantity of #100.  I called pharmacy and clarified directions of Tramadol two tablets by mouth every 6(six) hours prn , disp #120 with 3 refills per [name].  Shaquelle Hernon, Darlyne Russian, CMA

## 2012-07-22 NOTE — Telephone Encounter (Signed)
He can take his Tramadol Q6 hrs prn as well,but I gave him 120 tablet which I have always given so he should have enough to last him for the months,it is a prn medication.

## 2012-08-05 ENCOUNTER — Ambulatory Visit (INDEPENDENT_AMBULATORY_CARE_PROVIDER_SITE_OTHER): Payer: Self-pay | Admitting: Sports Medicine

## 2012-08-05 VITALS — BP 141/99 | Ht 71.0 in | Wt 281.0 lb

## 2012-08-05 DIAGNOSIS — M545 Low back pain: Secondary | ICD-10-CM
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
## 2012-08-05 NOTE — Patient Instructions (Addendum)
MRI [facility] June 20TH 4PM [phone]

## 2012-08-18 ENCOUNTER — Telehealth: Payer: Self-pay | Admitting: Family Medicine

## 2012-08-18 NOTE — Telephone Encounter (Signed)
Patient has been scheduled for a MRI on 6/20. States he has high anxiety when being placed in MRI machine and would like som medicine called in to help calm him during procedure.

## 2012-08-18 NOTE — Telephone Encounter (Signed)
Spoke with patient.  He has had an MRI before and has taken Ativan prior with relief.  Pt uses [employer].  Will FWD to MD for review.  Alane Hanssen, Darlyne Russian, CMA

## 2012-08-19 ENCOUNTER — Other Ambulatory Visit: Payer: Self-pay | Admitting: Family Medicine

## 2012-08-19 MED ORDER — LORAZEPAM 2 MG PO TABS: 2.0000 mg | ORAL_TABLET | Freq: Once | ORAL | Status: AC

## 2012-08-19 NOTE — Telephone Encounter (Signed)
Faxed Rx to [facility] [phone]) [name] wrote Rx for Lorazepam 2mg  tab, one by mouth one time 15-81min prior to MRI, disp one pill.Tabb Croghan, Darlyne Russian, CMA

## 2012-08-19 NOTE — Telephone Encounter (Signed)
Will bring you the prescription to fax to pharmacy.

## 2012-08-19 NOTE — Telephone Encounter (Signed)
Will FWD to MD high priority.  Dnya Hickle, Darlyne Russian, CMA

## 2012-08-19 NOTE — Telephone Encounter (Signed)
Patient calls again about needing Ativan to take during MRI on Friday. Is thinking about canceling procedure altogether. Would like to know something today from his PCP.

## 2012-08-21 ENCOUNTER — Ambulatory Visit (HOSPITAL_COMMUNITY): Payer: Self-pay

## 2012-08-27 ENCOUNTER — Encounter (HOSPITAL_COMMUNITY): Payer: Self-pay | Admitting: *Deleted

## 2012-08-27 ENCOUNTER — Emergency Department (HOSPITAL_COMMUNITY)
Admission: EM | Admit: 2012-08-27 | Discharge: 2012-08-27 | Disposition: A | Discharge status: Home / Self Care | Payer: Self-pay | Attending: Emergency Medicine | Admitting: Emergency Medicine

## 2012-08-27 DIAGNOSIS — F1910 Other psychoactive substance abuse, uncomplicated: Secondary | ICD-10-CM | POA: Insufficient documentation

## 2012-08-27 DIAGNOSIS — Z7982 Long term (current) use of aspirin: Secondary | ICD-10-CM | POA: Insufficient documentation

## 2012-08-27 DIAGNOSIS — Z8739 Personal history of other diseases of the musculoskeletal system and connective tissue: Secondary | ICD-10-CM | POA: Insufficient documentation

## 2012-08-27 DIAGNOSIS — Z8659 Personal history of other mental and behavioral disorders: Secondary | ICD-10-CM | POA: Insufficient documentation

## 2012-08-27 DIAGNOSIS — F17200 Nicotine dependence, unspecified, uncomplicated: Secondary | ICD-10-CM | POA: Insufficient documentation

## 2012-08-27 DIAGNOSIS — E669 Obesity, unspecified: Secondary | ICD-10-CM | POA: Insufficient documentation

## 2012-08-27 DIAGNOSIS — I10 Essential (primary) hypertension: Secondary | ICD-10-CM | POA: Insufficient documentation

## 2012-08-27 DIAGNOSIS — M546 Pain in thoracic spine: Secondary | ICD-10-CM | POA: Insufficient documentation

## 2012-08-27 DIAGNOSIS — Z79899 Other long term (current) drug therapy: Secondary | ICD-10-CM | POA: Insufficient documentation

## 2012-08-27 DIAGNOSIS — Z8679 Personal history of other diseases of the circulatory system: Secondary | ICD-10-CM | POA: Insufficient documentation

## 2012-08-27 DIAGNOSIS — M549 Dorsalgia, unspecified: Secondary | ICD-10-CM

## 2012-08-27 DIAGNOSIS — M6281 Muscle weakness (generalized): Secondary | ICD-10-CM | POA: Insufficient documentation

## 2012-08-27 MED ORDER — HYDROCODONE-ACETAMINOPHEN 5-325 MG PO TABS: 1.0000 | ORAL_TABLET | Freq: Four times a day (QID) | ORAL | Status: DC | PRN

## 2012-08-27 MED ORDER — DIAZEPAM 5 MG PO TABS: 5.0000 mg | ORAL_TABLET | Freq: Three times a day (TID) | ORAL | Status: DC | PRN

## 2012-08-27 MED ORDER — DIAZEPAM 5 MG PO TABS
10.0000 mg | ORAL_TABLET | Freq: Once | ORAL | Status: AC
  Administered 2012-08-27: 10 mg via ORAL
  Filled 2012-08-27: qty 2

## 2012-08-27 MED ORDER — OXYCODONE-ACETAMINOPHEN 5-325 MG PO TABS
2.0000 | ORAL_TABLET | Freq: Once | ORAL | Status: AC
  Administered 2012-08-27: 2 via ORAL
  Filled 2012-08-27: qty 2

## 2012-08-27 NOTE — ED Provider Notes (Signed)
History    This chart was scribed for non-physician practitioner [name], PA working with Travis [name], * by Travis [name], ED Scribe. This patient was seen in room TR09C/TR09C and the patient's care was started at 4:48 PM .    CSN: [id number]  Arrival date & time 08/27/12  1321    Chief Complaint  Patient presents with  . Shoulder Pain    The history is provided by the patient. No language interpreter was used.    HPI Comments: Travis [name] is a 47 y.o. male with h/o degenerative disk disease, spinal stenosis and chronic pain who presents to the Emergency Department complaining of 2 days of constant, severe pain to the upper back, bilateral shoulders, and bilateral upper arms.  Upper back pain is generalized and radiates up each side of the neck.  He states that pain is "excruciating," and characterized as cramping and "like I've got pressure on the inside of my arms and my shoulders and it's wanting to get out."  He states that pain is exacerbated by staying in one position for a prolonged period of timd and by raising his arms, and he notes "I can barely get a shirt on by myself."  Arm pain is relieved somewhat by wrapping the elbows with ACE bandages.   He attempted to treat pain with Tramadol 50 mg, without relief.  He also reports that his arms felt cold to the touch last night.  He denies weakness, numbness or tingling to arms and denies dropping objects recently.  He notes chronic weakness to the left leg that is at baselines.  He denies weakness to the right leg, urinary or bowel incontinence, or recent falls.  Pt has h/o chronic lower back and left leg pain but states his present upper body complaints are new.  He admits to h/o moderate, intermittent pain to the upper back and shoulders that was similar in quality to his present pain but that resolved on its own and was much less severe in the past.  He denies h/o similar arm pain.   Pt is scheduled to receive  an MRI of the entire back on 09/11/12, and he has an appointment with his PCP next week.  Additional medical history includes HTN, obesity, anxiety, and polysubstance abuse.  In addition to tramadol he medicates regularly with clonidine, lisinopril, metoprolol, and aspirin.  He does not take narcotic medications.  He notes that he has had adverse reactions (tachycardia) to muscle relaxants including methocarbamol and Flexeril.  He has not been given Valium.     Past Medical History  Diagnosis Date  . Hypertension   . Anxiety   . Tachycardia - pulse   . Degenerative disk disease   . Spinal stenosis   . Degenerative disk disease   . Tobacco abuse   . Obesity   . Polysubstance abuse   . Chest pain     Hospital, March, 2014, negative enzymes, patient refused in-hospital  stress test, patient canceled outpatient stress test    History reviewed. No pertinent past surgical history. Family History  Problem Relation Age of Onset  . Stroke Mother   . Hypertension Mother   . Stroke Father   . Hypertension Father   . Dementia Father   . Diabetes Father   . Heart disease Father   . Cancer Sister     brain  . Heart disease Brother     History  Substance Use Topics  . Smoking status: Light Tobacco Smoker --  0.25 packs/day    Types: Cigarettes    Last Attempt to Quit: 06/10/2012  . Smokeless tobacco: Not on file  . Alcohol Use: No       Review of Systems  HENT: Negative for neck pain and neck stiffness.   Musculoskeletal: Positive for back pain and arthralgias.  Skin: Negative for wound.  Neurological: Positive for weakness (Left leg, chronic). Negative for numbness.  All other systems reviewed and are negative.      Allergies  Blueberry flavor; Other; Penicillins; and Toradol   Home Medications   Current Outpatient Rx  Name  Route  Sig  Dispense  Refill  . aspirin 81 MG EC tablet   Oral   Take 81 mg by mouth daily.         . cloNIDine (CATAPRES) 0.2 MG tablet    Oral   Take 1 tablet (0.2 mg total) by mouth 2 (two) times daily.   60 tablet   3   . lisinopril (PRINIVIL,ZESTRIL) 20 MG tablet   Oral   Take 1 tablet (20 mg total) by mouth daily.   30 tablet   3   . metoprolol tartrate (LOPRESSOR) 25 MG tablet   Oral   Take 1 tablet (25 mg total) by mouth 2 (two) times daily.   60 tablet   3   . ranitidine (ZANTAC) 75 MG tablet   Oral   Take 150 mg by mouth 2 (two) times daily.         . traMADol (ULTRAM) 50 MG tablet   Oral   Take 100 mg by mouth every 6 (six) hours as needed for pain.         . diazepam (VALIUM) 5 MG tablet   Oral   Take 1 tablet (5 mg total) by mouth every 8 (eight) hours as needed for anxiety (Take 1 tablet every 8 hours as needed for muscle spasms.).   9 tablet   0   . HYDROcodone-acetaminophen (NORCO/VICODIN) 5-325 MG per tablet   Oral   Take 1 tablet by mouth every 6 (six) hours as needed for pain (Take 1 - 2 tablets every 4 - 6 hours.).   5 tablet   0     BP 142/107  Pulse 108  Temp(Src) 98.4 F (36.9 C) (Oral)  Resp 16  SpO2 95%   Physical Exam  Nursing note and vitals reviewed. Constitutional: He is oriented to person, place, and time. He appears well-developed and well-nourished. No distress.  HENT:  Head: Normocephalic and atraumatic.  Mouth/Throat: Oropharynx is clear and moist. No oropharyngeal exudate.  Eyes: Conjunctivae are normal.  Neck: Normal range of motion. Neck supple.  Full ROM without pain  Cardiovascular: Normal rate, regular rhythm and intact distal pulses.   Capillary refill <3 seconds  Pulmonary/Chest: Effort normal and breath sounds normal. No respiratory distress. He has no wheezes.  Abdominal: Soft. He exhibits no distension. There is no tenderness.  Musculoskeletal: He exhibits tenderness. He exhibits no edema.  No midline tenderness Paraspinal tenderness of the upper T-spine Tenderness to bilateral trapezius Decreased abduction in shoulders secondary to  pain Full range of motion of the T-spine  No tenderness to palpation of the spinous processes of the L-spine No tenderness to palpation of the paraspinous muscles of the L-spine  Lymphadenopathy:    He has no cervical adenopathy.  Neurological: He is alert and oriented to person, place, and time. He has normal reflexes. Coordination normal.  Speech is clear and goal  oriented, follows commands Normal strength in upper extremities bilaterally including  strong and equal grip strength Right lower extremity slightly stronger than left lower extremity including dorsiflexion and plantar flexion (pt states this is chronic and unchanged from baseline) Sensation normal to light and sharp touch Moves extremities without ataxia, coordination intact Normal finger to nose and rapid alternating movements Normal gait and balance  Skin: Skin is warm and dry. No rash noted. He is not diaphoretic. No erythema.  No tenting of the skin  Psychiatric: He has a normal mood and affect.    ED Course  Procedures (including critical care time)   DIAGNOSTIC STUDIES: Oxygen Saturation is 95% on room air, adequate by my interpretation.     COORDINATION OF CARE: 4:56 PM-Discussed treatment plan which includes pain medications and muscle relaxants with pt at bedside and pt agreed to plan.      Labs Reviewed - No data to display   No results found.   1. Thoracic back pain   2. Back pain      MDM  Marsean Elkhatib presents with upper back pain.  NO CP, SOB, hypoxia, tearing sensation in the chest.  No neurological deficits from baseline.  Patient can walk without difficulty with cane to baseline.  No loss of bowel or bladder control.  No concern for cauda equina.  No fever, night sweats, weight loss, h/o cancer, IVDU.  RICE protocol and pain medicine indicated and discussed with patient. Pt has MRI scheduled for July 11.  Will d/c home with pain medication and close follow-up.  I have also discussed reasons  to return immediately to the ER.  Patient expresses understanding and agrees with plan.  I personally performed the services described in this documentation, which was scribed in my presence. The recorded information has been reviewed and is accurate.'   [name], PA-C 08/27/12 2350

## 2012-08-27 NOTE — ED Notes (Signed)
Pt reports bilateral shoulder, neck and arm pain x 2-3 days.  Reports that his arms have been cold.  States that wrapping his elbows with ace bandage helps with his pain.  Pt scheduled for an MRI of back and neck on July 11.

## 2012-08-27 NOTE — ED Notes (Signed)
C/o bilateral shoulder and neck pain, right > left. Has had on and off "for a while". No known injury.

## 2012-08-30 ENCOUNTER — Telehealth: Payer: Self-pay | Admitting: Family Medicine

## 2012-08-30 NOTE — Telephone Encounter (Signed)
Patient calling the emergency line regarding his neck pain. He was seen in the ED on Friday and was discharged with vicodin and muscle relaxer. He has finished the vicodin and is in a lot of pain. He was calling for advice as to what to do.  He has a h/o lower back pain and has a full back MRI scheduled for 09/11/12 for evaluation.  He currently denies any upper extremity weakness, numbness or tingling. He has pain in his arms and upper back. I told him that if he were to start having weakness or numbness and tingling to be seen in ED or urgent care. Regarding his pain, I told him I could not prescribe anything over the phone and if he was in an unsurmountable amount of pain, he should go to urgent care to be evaluated.  Patient expressed understanding and agreed with plan.   Travis [name], PGY-2 Family Medicine Resident

## 2012-08-31 NOTE — ED Provider Notes (Signed)
Medical screening examination/treatment/procedure(s) were performed by non-physician practitioner and as supervising physician I was immediately available for consultation/collaboration.    Hershey Knauer J. Reeshemah Nazaryan, MD 08/31/12 1537 

## 2012-09-10 ENCOUNTER — Telehealth: Payer: Self-pay | Admitting: Family Medicine

## 2012-09-10 NOTE — Telephone Encounter (Signed)
Patient is calling because last Tuesday he got 30 Clonidine and he take 2 a day.  He got up this morning and he doesn't have any left and he doesn't know what happened to them.  He is hoping that his doctor will send some to his pharmacy, [facility].

## 2012-09-10 NOTE — Telephone Encounter (Signed)
Pt called back wanting to know that status of his missing meds. He says that his BP is high because he has not taking any today. He was wanting to see if another Dr. Cleon Dew call this in if his DR. Is not here. JW

## 2012-09-10 NOTE — Telephone Encounter (Signed)
I called his pharmacy and told told he requested for 30 pills instead of 60,but he still has his refill at the pharmacy open for pick up,please advise patient to go pick up med at the pharmacy.

## 2012-09-11 ENCOUNTER — Ambulatory Visit (HOSPITAL_COMMUNITY)
Admission: RE | Admit: 2012-09-11 | Discharge: 2012-09-11 | Disposition: A | Discharge status: Home / Self Care | Payer: Self-pay | Source: Ambulatory Visit | Attending: Sports Medicine | Admitting: Sports Medicine

## 2012-09-11 ENCOUNTER — Other Ambulatory Visit: Payer: Self-pay | Admitting: Family Medicine

## 2012-09-11 DIAGNOSIS — M5126 Other intervertebral disc displacement, lumbar region: Secondary | ICD-10-CM | POA: Insufficient documentation

## 2012-09-11 DIAGNOSIS — M545 Low back pain: Secondary | ICD-10-CM

## 2012-09-11 DIAGNOSIS — G8929 Other chronic pain: Secondary | ICD-10-CM | POA: Insufficient documentation

## 2012-09-14 ENCOUNTER — Telehealth: Payer: Self-pay | Admitting: Family Medicine

## 2012-09-14 NOTE — Telephone Encounter (Signed)
Pt is having pain in upper back with bad spasm's. Pt had a MRI on 7/11. Pt was told by [facility] that when they received the MRI results they would call him with results and let him know about further appointments. Pt would like [name] to send him something for the pain until [facility] calls him with an appointment. If the Dr. Aloha Gell  Call in prescription send this to [facility]. JW

## 2012-09-14 NOTE — Telephone Encounter (Signed)
Will forward to pcp

## 2012-09-14 NOTE — Telephone Encounter (Signed)
Patient has prescription for Toradol at [facility], have him use this, I can also give Flexeril prn if he will like to try this. Please let me know.

## 2012-09-15 NOTE — Telephone Encounter (Signed)
Pt states that [facility] that does help a little.  Was advised by [name] that he has a lumbar herniation.  Advised pt to call sports medicine to schedule a follow up.  Pt is ok with this and plans to give them a call.  He also was informed that he could be sent to pain clinic to help manage his pain, but states that he cannot afford to pay them out of pocket.  Travis [name],  Travis [name]

## 2012-09-15 NOTE — Telephone Encounter (Signed)
Pt states that he has taken toradol and flexeril with no relief.  They also make his heart race.  Please advise if there is something else that he can do.

## 2012-09-16 ENCOUNTER — Emergency Department (HOSPITAL_COMMUNITY)
Admission: EM | Admit: 2012-09-16 | Discharge: 2012-09-16 | Disposition: A | Discharge status: Home / Self Care | Payer: Self-pay | Attending: Emergency Medicine | Admitting: Emergency Medicine

## 2012-09-16 ENCOUNTER — Encounter (HOSPITAL_COMMUNITY): Payer: Self-pay | Admitting: Nurse Practitioner

## 2012-09-16 DIAGNOSIS — Z79899 Other long term (current) drug therapy: Secondary | ICD-10-CM | POA: Insufficient documentation

## 2012-09-16 DIAGNOSIS — Z880 Allergy status to penicillin: Secondary | ICD-10-CM | POA: Insufficient documentation

## 2012-09-16 DIAGNOSIS — Z7982 Long term (current) use of aspirin: Secondary | ICD-10-CM | POA: Insufficient documentation

## 2012-09-16 DIAGNOSIS — E669 Obesity, unspecified: Secondary | ICD-10-CM | POA: Insufficient documentation

## 2012-09-16 DIAGNOSIS — Z8739 Personal history of other diseases of the musculoskeletal system and connective tissue: Secondary | ICD-10-CM | POA: Insufficient documentation

## 2012-09-16 DIAGNOSIS — G8929 Other chronic pain: Secondary | ICD-10-CM | POA: Insufficient documentation

## 2012-09-16 DIAGNOSIS — I10 Essential (primary) hypertension: Secondary | ICD-10-CM | POA: Insufficient documentation

## 2012-09-16 DIAGNOSIS — R209 Unspecified disturbances of skin sensation: Secondary | ICD-10-CM | POA: Insufficient documentation

## 2012-09-16 DIAGNOSIS — Z8679 Personal history of other diseases of the circulatory system: Secondary | ICD-10-CM | POA: Insufficient documentation

## 2012-09-16 DIAGNOSIS — F411 Generalized anxiety disorder: Secondary | ICD-10-CM | POA: Insufficient documentation

## 2012-09-16 DIAGNOSIS — Z760 Encounter for issue of repeat prescription: Secondary | ICD-10-CM | POA: Insufficient documentation

## 2012-09-16 DIAGNOSIS — R202 Paresthesia of skin: Secondary | ICD-10-CM

## 2012-09-16 DIAGNOSIS — F17200 Nicotine dependence, unspecified, uncomplicated: Secondary | ICD-10-CM | POA: Insufficient documentation

## 2012-09-16 MED ORDER — DIAZEPAM 5 MG PO TABS: 5.0000 mg | ORAL_TABLET | Freq: Three times a day (TID) | ORAL | Status: DC | PRN

## 2012-09-16 MED ORDER — TRAMADOL HCL 50 MG PO TABS: 100.0000 mg | ORAL_TABLET | Freq: Four times a day (QID) | ORAL | Status: DC | PRN

## 2012-09-16 NOTE — ED Notes (Signed)
Pt had MRI on Friday. Has follow up appointment with Dr. Margaretha Sheffield on 7/28. Here today for pain control. States he doesn't want a narcotic to take home because of past hx of narcotic dependency. States would like "something her.Marland KitchenMarland KitchenI didn't drive". States we gave him Diazepam last time and it really helped.

## 2012-09-16 NOTE — ED Notes (Signed)
Pt with hx degenerative disk disease with chronic pain. Reports he was seen here for bilateral shoulder pain last week, had MRIs on Friday. Reports for past 2 days he has been having intermittent weakness in his R arm and the arm feels numb. Pt is being followed by sports medcine MD for same. CMS intact now, no neuro deficits

## 2012-09-16 NOTE — ED Provider Notes (Signed)
Medical screening examination/treatment/procedure(s) were performed by non-physician practitioner and as supervising physician I was immediately available for consultation/collaboration.   Junetta Hearn, MD 09/16/12 1511 

## 2012-09-16 NOTE — ED Provider Notes (Signed)
History    CSN: [id number] Arrival date & time 09/16/12  1135  First MD Initiated Contact with Patient 09/16/12 1226     Chief Complaint  Patient presents with  . Shoulder Pain   (Consider location/radiation/quality/duration/timing/severity/associated sxs/prior Treatment) HPI  Travis [name] is a(n) 47 y.o. male who presents to the emergency department for medication course change.  The patient is followed by [name] in the internal medicine teaching department. Patient states that he spent several hours yesterday on the phone and there was some confusion concerning his medications.  Review of chart states that he was prescribed Toradol and got tramadol.  He states that this medication does not work well for him and get him care of or rebound headaches.  Patient comes in today seeking change in medication from portal to tramadol and diazepam for muscle relaxation.  He has a history of chronic back pain and degenerative disc disease.  The patient complains of worsening weakness on the right hand with loss of grip strength and paresthesia.  The patient is being followed by Dr. Margaretha Sheffield.  He just had an MRI last week.  He is to follow up with Dr. Margaretha Sheffield next week. Denies  facial asymmetry, difficulty with speech, change in gait, or vertigo.  Past Medical History  Diagnosis Date  . Hypertension   . Anxiety   . Tachycardia - pulse   . Degenerative disk disease   . Spinal stenosis   . Degenerative disk disease   . Tobacco abuse   . Obesity   . Polysubstance abuse   . Chest pain     Hospital, March, 2014, negative enzymes, patient refused in-hospital  stress test, patient canceled outpatient stress test   History reviewed. No pertinent past surgical history. Family History  Problem Relation Age of Onset  . Stroke Mother   . Hypertension Mother   . Stroke Father   . Hypertension Father   . Dementia Father   . Diabetes Father   . Heart disease Father   . Cancer Sister     brain  .  Heart disease Brother    History  Substance Use Topics  . Smoking status: Light Tobacco Smoker -- 0.25 packs/day    Types: Cigarettes    Last Attempt to Quit: 06/10/2012  . Smokeless tobacco: Not on file  . Alcohol Use: No    Review of Systems Ten systems reviewed and are negative for acute change, except as noted in the HPI.   Allergies  Blueberry flavor; Other; Penicillins; and Toradol  Home Medications   Current Outpatient Rx  Name  Route  Sig  Dispense  Refill  . aspirin 81 MG EC tablet   Oral   Take 81 mg by mouth daily.         . cloNIDine (CATAPRES) 0.2 MG tablet   Oral   Take 1 tablet (0.2 mg total) by mouth 2 (two) times daily.   60 tablet   3   . diazepam (VALIUM) 5 MG tablet   Oral   Take 5 mg by mouth every 8 (eight) hours as needed for anxiety (Take 1 tablet every 8 hours as needed for muscle spasms.).         Marland Kitchen HYDROcodone-acetaminophen (NORCO/VICODIN) 5-325 MG per tablet   Oral   Take 1 tablet by mouth every 6 (six) hours as needed for pain (Take 1 - 2 tablets every 4 - 6 hours.).   5 tablet   0   . lisinopril (  PRINIVIL,ZESTRIL) 20 MG tablet   Oral   Take 1 tablet (20 mg total) by mouth daily.   30 tablet   3   . metoprolol tartrate (LOPRESSOR) 25 MG tablet   Oral   Take 1 tablet (25 mg total) by mouth 2 (two) times daily.   60 tablet   3   . ranitidine (ZANTAC) 75 MG tablet   Oral   Take 75 mg by mouth 2 (two) times daily.          . traMADol (ULTRAM) 50 MG tablet                BP 145/107  Pulse 96  Temp(Src) 98.7 F (37.1 C) (Oral)  Resp 18  Ht 5\' 11"  (1.803 m)  Wt 282 lb (127.914 kg)  BMI 39.35 kg/m2  SpO2 98% Physical Exam Physical Exam  Nursing note and vitals reviewed. Constitutional: He appears well-developed and well-nourished. No distress.  HENT:  Head: Normocephalic and atraumatic.  Eyes: Conjunctivae normal are normal. No scleral icterus.  Neck: Normal range of motion. Neck supple.  Cardiovascular:  Normal rate, regular rhythm and normal heart sounds.   Pulmonary/Chest: Effort normal and breath sounds normal. No respiratory distress.  Abdominal: Soft. There is no tenderness.  Musculoskeletal: He exhibits no edema.  Neurological: He is alert.  Weakness of grip on the right hand Skin: Skin is warm and dry. He is not diaphoretic.  Psychiatric: His behavior is normal.    ED Course  Procedures (including critical care time) Labs Reviewed - No data to display No results found. 1. Medication course changed   2. Chronic pain   3. Paresthesia     MDM  1:27 PM Filed Vitals:   09/16/12 1141  BP: 145/107  Pulse: 96  Temp: 98.7 F (37.1 C)  Resp: 18   Patient medication course change the to tramadol and diazepam.  He is to followup with his care providers in the outpatient setting.  The patient is understanding and agrees with plan of care.  Do not suspect TIA or stroke symptoms.  This is a worsening of his chronic condition.  Arthor Captain, PA-C 09/16/12 1328

## 2012-09-17 ENCOUNTER — Telehealth: Payer: Self-pay | Admitting: Family Medicine

## 2012-09-17 NOTE — Telephone Encounter (Signed)
Pt is requesting a referral for the pain clinic. He called the clinic at [facility] and they said they only take 2 orange card appointments twice a month. They have an opening for September and needs the referral before then to get in. JW

## 2012-09-18 ENCOUNTER — Encounter: Payer: Self-pay | Admitting: *Deleted

## 2012-09-18 ENCOUNTER — Telehealth: Payer: Self-pay | Admitting: Sports Medicine

## 2012-09-18 ENCOUNTER — Other Ambulatory Visit: Payer: Self-pay | Admitting: *Deleted

## 2012-09-18 ENCOUNTER — Other Ambulatory Visit: Payer: Self-pay | Admitting: Family Medicine

## 2012-09-18 ENCOUNTER — Telehealth: Payer: Self-pay | Admitting: Family Medicine

## 2012-09-18 DIAGNOSIS — M4800 Spinal stenosis, site unspecified: Secondary | ICD-10-CM

## 2012-09-18 DIAGNOSIS — M549 Dorsalgia, unspecified: Secondary | ICD-10-CM

## 2012-09-18 MED ORDER — TRAMADOL HCL 50 MG PO TABS: 100.0000 mg | ORAL_TABLET | Freq: Four times a day (QID) | ORAL | Status: DC | PRN

## 2012-09-18 NOTE — Telephone Encounter (Signed)
Will forward to Dr Lum Babe for review.

## 2012-09-18 NOTE — Progress Notes (Signed)
     Referral to pain clinic made.        Barnie Alderman, CMA at 09/18/2012 8:52 AM    Status: Signed             Will forward to Dr Lum Babe for review.        Caren Macadam at 09/17/2012 3:33 PM    Status: Signed             Pt is requesting a referral for the pain clinic. He called the clinic at [facility] and they said they only take 2 orange card appointments twice a month. They have an opening for September and needs the referral before then to get in. JW

## 2012-09-18 NOTE — Telephone Encounter (Signed)
Emergency Line Phone Call:   Pain in both shoulders for which he went to the emergency room. Received diazepam and tramadol for it. Right hand is weak and he is dropping things.  He is asking me if the flexeril had been called in. He had called earlier to see if flexeril could be called in so that he could take it instead of valium which is not working as well.  I looked it up on the medication list and did not see that flexeril was sent.  I recommended that he try aspecreme or heating pads for the shoulder pain. Patient to call on Monday for clarification regarding flexeril.  Patient expressed understanding and agreed to plan.   Marena Chancy, PGY-3 Family Medicine Resident

## 2012-09-18 NOTE — Telephone Encounter (Signed)
I spoke with the patient on the phone today regarding MRI findings of his lumbar spine. When compared to the MRI in 2008 there's been very little change. He has stable multilevel degenerative changes without evidence of severe spinal or foraminal stenosis. Treatment options are limited given the fact that he has the "orange card". However, he tells me that he spoke with someone at [facility] and that they will take this form of "insurance". Therefore I will make the referral to this pain management clinic for him. In the meantime I have agreed to call in a few more Ultram for him to take 50 mg twice a day when necessary. I would defer further treatment to the discretion of the pain clinic and the patient will followup with me when necessary.

## 2012-09-18 NOTE — Telephone Encounter (Signed)
Referral to pain clinic made

## 2012-09-20 ENCOUNTER — Telehealth: Payer: Self-pay | Admitting: Family Medicine

## 2012-09-20 MED ORDER — CLONIDINE HCL 0.2 MG PO TABS: 0.2000 mg | ORAL_TABLET | Freq: Two times a day (BID) | ORAL | Status: DC

## 2012-09-20 NOTE — Telephone Encounter (Signed)
Patient calls the emergency line stating that he can not find his clonidine. He has not been able to take this medication since yesterday and is very concerned about his blood pressure rebounding the opposite direction. Asking that enough be sent in to get him to a point where [facility]. I sent in clonidine 0.2 mg BID #10 pills and advised the patient that he still has refills on his original prescription once he is able to get [facility]. Advised if he need additional refills to call our office.  Marikay Alar, MD PGY2, [facility]

## 2012-09-22 ENCOUNTER — Other Ambulatory Visit: Payer: Self-pay | Admitting: Family Medicine

## 2012-09-23 ENCOUNTER — Encounter: Payer: Self-pay | Admitting: *Deleted

## 2012-09-23 ENCOUNTER — Other Ambulatory Visit: Payer: Self-pay | Admitting: Family Medicine

## 2012-09-23 NOTE — Telephone Encounter (Signed)
This refill was done earlier today.

## 2012-09-23 NOTE — Telephone Encounter (Signed)
This encounter was created in error - please disregard.

## 2012-09-24 ENCOUNTER — Telehealth: Payer: Self-pay | Admitting: Family Medicine

## 2012-09-24 ENCOUNTER — Other Ambulatory Visit: Payer: Self-pay | Admitting: Family Medicine

## 2012-09-24 MED ORDER — TRAMADOL HCL 50 MG PO TABS: 100.0000 mg | ORAL_TABLET | Freq: Four times a day (QID) | ORAL | Status: DC | PRN

## 2012-09-24 NOTE — Telephone Encounter (Signed)
Reviewed refill.  According to our records the rx was printed.  Called in [place]. [place], Maryjo [place]

## 2012-09-24 NOTE — Telephone Encounter (Signed)
[facility] and [facility] said [facility] received the [facility] and could we re-fax this to them today. JW

## 2012-09-24 NOTE — Telephone Encounter (Signed)
Pt is stating that [facility] filled his prescription of Clondine, but he is saying that he didn't get it. This was last week. [facility] will not refill this because they said they already did. So he is requesting that [name] send another refill to them. JW

## 2012-09-28 ENCOUNTER — Ambulatory Visit (INDEPENDENT_AMBULATORY_CARE_PROVIDER_SITE_OTHER): Payer: Self-pay | Admitting: Sports Medicine

## 2012-09-28 ENCOUNTER — Telehealth: Payer: Self-pay | Admitting: Family Medicine

## 2012-09-28 VITALS — BP 150/120 | Ht 71.0 in | Wt 282.0 lb

## 2012-09-28 DIAGNOSIS — M545 Low back pain: Secondary | ICD-10-CM

## 2012-09-28 MED ORDER — TRAMADOL HCL 50 MG PO TABS: 100.0000 mg | ORAL_TABLET | Freq: Four times a day (QID) | ORAL | Status: DC | PRN

## 2012-09-28 NOTE — Telephone Encounter (Signed)
Patient is calling because he lost his Tramadal and Clonidine.  The pharmacy refilled the Clonidine, but won't refilled the Tramadol without permission from [name].

## 2012-09-29 ENCOUNTER — Telehealth: Payer: Self-pay | Admitting: Family Medicine

## 2012-09-29 NOTE — Progress Notes (Signed)
Patient ID: Travis [name], male   DOB: 1965-09-23, 47 y.o.   MRN: [id number]  Patient comes in today with persistent low back pain. My recommendation to him previously was for pain management referral. Dr. Patrina Levering has agreed to see him in consultation for possible epidural injections but will not provide narcotic medicine for him. As I explained to the patient previously there is not much else I can offer him in the form of treatment. He initially asked me for a refill on Ultram which I did provide but after a phone call from the pharmacy telling me that he had picked up 120 of these pills a few days ago I canceled this refill. Patient is discharged from my care to followup prn.

## 2012-09-29 NOTE — Telephone Encounter (Signed)
Callled after hours line  Calling to complain about his medical care at this point. Feels like no one is doing anything to properly Dx pain. Proud that we has able to wean himself off of pain meds and recognizes previous narcotics abuse requiring Methadone clinic to come off. Today pain is out of control Mother w/ dementia threw away his clonidine and tramadol. Tonight in a lot of pain and requesting tramadol for pain. Told that his previously written tramadol refill must be cleared by PCP by pharmacy as the refill would be too soon. I told refills must come from PCP, and will rout message to PCP.  Shelly Flatten, MD Family Medicine PGY-3 09/29/2012, 8:00 PM

## 2012-09-30 ENCOUNTER — Telehealth: Payer: Self-pay | Admitting: Family Medicine

## 2012-09-30 NOTE — Telephone Encounter (Signed)
I spoke with patient to discuss his medication,he stated his mother threw away his Clonidine and Tramadol, I advised him it is dangerous for his mom who has dementia to have access to his medication, also this has been a common pattern for patient to call that he lost his medication, I had given him the last chance, as discussed with him, I will refill his medication this one time, he need to keep his medications in a safe place where only him has access to it. Next time he calls for similar issue, as discussed with him, I will not refill medication and I will discharge him from my care. He verbalized understanding and agreed with plan. I called his pharmacy for early release of his refill since he just picked up 120 tablet of Tramadol on 09/24/12.

## 2012-10-07 ENCOUNTER — Encounter: Payer: Self-pay | Admitting: Physical Medicine & Rehabilitation

## 2012-10-08 ENCOUNTER — Encounter (HOSPITAL_COMMUNITY): Payer: Self-pay | Admitting: Emergency Medicine

## 2012-10-08 ENCOUNTER — Emergency Department (HOSPITAL_COMMUNITY)
Admission: EM | Admit: 2012-10-08 | Discharge: 2012-10-08 | Disposition: A | Discharge status: Home / Self Care | Payer: Self-pay | Attending: Emergency Medicine | Admitting: Emergency Medicine

## 2012-10-08 DIAGNOSIS — G8929 Other chronic pain: Secondary | ICD-10-CM | POA: Insufficient documentation

## 2012-10-08 DIAGNOSIS — Z79899 Other long term (current) drug therapy: Secondary | ICD-10-CM | POA: Insufficient documentation

## 2012-10-08 DIAGNOSIS — F411 Generalized anxiety disorder: Secondary | ICD-10-CM | POA: Insufficient documentation

## 2012-10-08 DIAGNOSIS — Z8739 Personal history of other diseases of the musculoskeletal system and connective tissue: Secondary | ICD-10-CM | POA: Insufficient documentation

## 2012-10-08 DIAGNOSIS — Z880 Allergy status to penicillin: Secondary | ICD-10-CM | POA: Insufficient documentation

## 2012-10-08 DIAGNOSIS — E669 Obesity, unspecified: Secondary | ICD-10-CM | POA: Insufficient documentation

## 2012-10-08 DIAGNOSIS — M545 Low back pain, unspecified: Secondary | ICD-10-CM | POA: Insufficient documentation

## 2012-10-08 DIAGNOSIS — M79609 Pain in unspecified limb: Secondary | ICD-10-CM | POA: Insufficient documentation

## 2012-10-08 DIAGNOSIS — R209 Unspecified disturbances of skin sensation: Secondary | ICD-10-CM | POA: Insufficient documentation

## 2012-10-08 DIAGNOSIS — F17200 Nicotine dependence, unspecified, uncomplicated: Secondary | ICD-10-CM | POA: Insufficient documentation

## 2012-10-08 DIAGNOSIS — Z7982 Long term (current) use of aspirin: Secondary | ICD-10-CM | POA: Insufficient documentation

## 2012-10-08 DIAGNOSIS — M6281 Muscle weakness (generalized): Secondary | ICD-10-CM | POA: Insufficient documentation

## 2012-10-08 DIAGNOSIS — Z8679 Personal history of other diseases of the circulatory system: Secondary | ICD-10-CM | POA: Insufficient documentation

## 2012-10-08 DIAGNOSIS — I10 Essential (primary) hypertension: Secondary | ICD-10-CM | POA: Insufficient documentation

## 2012-10-08 MED ORDER — DIAZEPAM 5 MG PO TABS
5.0000 mg | ORAL_TABLET | Freq: Once | ORAL | Status: AC
  Administered 2012-10-08: 5 mg via ORAL
  Filled 2012-10-08: qty 1

## 2012-10-08 MED ORDER — HYDROCODONE-ACETAMINOPHEN 5-325 MG PO TABS
2.0000 | ORAL_TABLET | Freq: Once | ORAL | Status: AC
  Administered 2012-10-08: 2 via ORAL
  Filled 2012-10-08: qty 2

## 2012-10-08 MED ORDER — HYDROCODONE-ACETAMINOPHEN 5-325 MG PO TABS: 1.0000 | ORAL_TABLET | Freq: Four times a day (QID) | ORAL | Status: DC | PRN

## 2012-10-08 MED ORDER — DIAZEPAM 5 MG PO TABS: 5.0000 mg | ORAL_TABLET | Freq: Two times a day (BID) | ORAL | Status: DC

## 2012-10-08 NOTE — ED Notes (Signed)
Per patient, he is supposed to start going to pain clinic in 09/14.   Patient states he cant get in to see his doctor until 10/19/12.  Patient upset and wants pain management for chronic pain.

## 2012-10-08 NOTE — ED Notes (Signed)
Has been seen brfore for same arm pain has had mri tramdol not helping he states rt hurts woorse than left

## 2012-10-08 NOTE — ED Provider Notes (Signed)
CSN: [id number]     Arrival date & time 10/08/12  1042 History     First MD Initiated Contact with Patient 10/08/12 1128     Chief Complaint  Patient presents with  . Arm Pain   (Consider location/radiation/quality/duration/timing/severity/associated sxs/prior Treatment) HPI Comments: Patient presents with chronic pain and muscle spasms of his right arm.  He reports that the pain has been present for years, but worse over the past 6-8 months.  He has been taking Tramadol for the pain without relief.  He denies any acute injury or trauma to the back.  He also reports that Valium has helped for his muscle spasms in the past, but he does not have any at this time.  He reports that he also has some numbness and weakness of his right arm, which he report is also chronic and has been present for the past year.  He called his PCP today, but was unable to get an appointment scheduled until next week.  He also reports that he tried to schedule a first time appointment with Pain Management, but was not able to be seen until September.  He is not currently being seen by Pain Management.  He is also complaining of lower back pain.  This pain has been present for years.  No change in the pain.  He denies fever, chills, saddle anaesthesia, or bowel and bladder incontinence.    Patient is a 47 y.o. male presenting with arm pain. The history is provided by the patient.  Arm Pain    Past Medical History  Diagnosis Date  . Hypertension   . Anxiety   . Tachycardia - pulse   . Degenerative disk disease   . Spinal stenosis   . Degenerative disk disease   . Tobacco abuse   . Obesity   . Polysubstance abuse   . Chest pain     Hospital, March, 2014, negative enzymes, patient refused in-hospital  stress test, patient canceled outpatient stress test   History reviewed. No pertinent past surgical history. Family History  Problem Relation Age of Onset  . Stroke Mother   . Hypertension Mother   . Stroke  Father   . Hypertension Father   . Dementia Father   . Diabetes Father   . Heart disease Father   . Cancer Sister     brain  . Heart disease Brother    History  Substance Use Topics  . Smoking status: Light Tobacco Smoker -- 0.25 packs/day    Types: Cigarettes    Last Attempt to Quit: 06/10/2012  . Smokeless tobacco: Not on file  . Alcohol Use: No    Review of Systems  Musculoskeletal: Positive for back pain.       Right arm pain  All other systems reviewed and are negative.    Allergies  Blueberry flavor; Other; Penicillins; and Toradol  Home Medications   Current Outpatient Rx  Name  Route  Sig  Dispense  Refill  . aspirin 81 MG EC tablet   Oral   Take 81 mg by mouth daily.         . cloNIDine (CATAPRES) 0.2 MG tablet   Oral   Take 0.2 mg by mouth 2 (two) times daily.         . diazepam (VALIUM) 5 MG tablet   Oral   Take 5 mg by mouth every 8 (eight) hours as needed (muscle spams).         Marland Kitchen lisinopril (PRINIVIL,ZESTRIL) 20  MG tablet   Oral   Take 20 mg by mouth daily.         . metoprolol tartrate (LOPRESSOR) 25 MG tablet   Oral   Take 25 mg by mouth 2 (two) times daily.         . ranitidine (ZANTAC) 75 MG tablet   Oral   Take 75 mg by mouth 2 (two) times daily as needed for heartburn.          . traMADol (ULTRAM) 50 MG tablet   Oral   Take 100 mg by mouth every 6 (six) hours as needed for pain.          BP 154/107  Pulse 100  Temp(Src) 98.5 F (36.9 C)  Resp 16  SpO2 100% Physical Exam  Nursing note and vitals reviewed. Constitutional: He appears well-developed and well-nourished.  HENT:  Head: Normocephalic and atraumatic.  Neck: Normal range of motion. Neck supple.  Cardiovascular: Normal rate, regular rhythm and normal heart sounds.   Pulmonary/Chest: Effort normal and breath sounds normal.  Musculoskeletal:  Limited ROM of the right arm and lower back, which patient reports is at baseline.  Neurological: He is alert.  No cranial nerve deficit. Gait (Patient walks with a cane.) abnormal.  Reflex Scores:      Brachioradialis reflexes are 2+ on the right side and 2+ on the left side.      Patellar reflexes are 2+ on the right side and 2+ on the left side.      Achilles reflexes are 2+ on the right side and 2+ on the left side. Decreased sensation of the right hand, which patient reports is at baseline. Grip strength 3/5 right hand, 5/5/ left hand, which patient also reports is at baseline  Skin: Skin is warm and dry.    ED Course   Procedures (including critical care time)  Labs Reviewed - No data to display No results found. No diagnosis found.  MDM  Patient presenting with chronic lower back pain.  Patient with back pain.  Neurological exam unchanged from baseline..  Patient can walk but states is painful.  No loss of bowel or bladder control.  No concern for cauda equina.  No fever, night sweats, weight loss, h/o cancer, IVDU.  RICE protocol and pain medicine indicated and discussed with patient. Patient also with chronic right arm pain and spasms.  Patient given short course of Valium for spasms and pain medication.  Instructed patient to follow up with PCP next week as scheduled.  Patient stable for discharge.  Return precautions given.  Pascal Lux [place], PA-C 10/09/12 [phone]

## 2012-10-09 ENCOUNTER — Other Ambulatory Visit: Payer: Self-pay | Admitting: *Deleted

## 2012-10-09 NOTE — Telephone Encounter (Signed)
Has this been addressed?

## 2012-10-09 NOTE — Telephone Encounter (Signed)
Please advise as to refill on Tramadol. Wyatt Haste, RN-BSN

## 2012-10-10 ENCOUNTER — Telehealth: Payer: Self-pay | Admitting: Family Medicine

## 2012-10-10 DIAGNOSIS — I10 Essential (primary) hypertension: Secondary | ICD-10-CM

## 2012-10-10 MED ORDER — CLONIDINE HCL 0.2 MG PO TABS: 0.2000 mg | ORAL_TABLET | Freq: Two times a day (BID) | ORAL | Status: DC

## 2012-10-10 NOTE — Telephone Encounter (Signed)
Yes this has been addressed.

## 2012-10-10 NOTE — Telephone Encounter (Signed)
Patient moved, and his clonidine was accidentally packed a box instead of going in his med bag. He needs clonidine called in to hold him over until he can find med. Called in 1 week script. Encouraged him to seek help if he feels his BP does not respond to clonidine, he gets dizzy, changes of vision, headache or he is unable to get script.  Claiborne Billings, ReneeDO PGY-2

## 2012-10-11 MED ORDER — METOPROLOL TARTRATE 25 MG PO TABS: 25.0000 mg | ORAL_TABLET | Freq: Two times a day (BID) | ORAL | Status: DC

## 2012-10-12 ENCOUNTER — Other Ambulatory Visit: Payer: Self-pay | Admitting: Family Medicine

## 2012-10-13 ENCOUNTER — Ambulatory Visit: Payer: Self-pay | Admitting: Family Medicine

## 2012-10-14 NOTE — ED Provider Notes (Signed)
Medical screening examination/treatment/procedure(s) were performed by non-physician practitioner and as supervising physician I was immediately available for consultation/collaboration.   Kwame Ryland B. Bernette Mayers, MD 10/14/12 1114

## 2012-10-25 ENCOUNTER — Encounter (HOSPITAL_COMMUNITY): Payer: Self-pay | Admitting: *Deleted

## 2012-10-25 ENCOUNTER — Emergency Department (HOSPITAL_COMMUNITY)
Admission: EM | Admit: 2012-10-25 | Discharge: 2012-10-25 | Disposition: A | Discharge status: Home / Self Care | Payer: Self-pay | Attending: Emergency Medicine | Admitting: Emergency Medicine

## 2012-10-25 DIAGNOSIS — S4980XA Other specified injuries of shoulder and upper arm, unspecified arm, initial encounter: Secondary | ICD-10-CM | POA: Insufficient documentation

## 2012-10-25 DIAGNOSIS — M549 Dorsalgia, unspecified: Secondary | ICD-10-CM

## 2012-10-25 DIAGNOSIS — W010XXA Fall on same level from slipping, tripping and stumbling without subsequent striking against object, initial encounter: Secondary | ICD-10-CM | POA: Insufficient documentation

## 2012-10-25 DIAGNOSIS — IMO0002 Reserved for concepts with insufficient information to code with codable children: Secondary | ICD-10-CM | POA: Insufficient documentation

## 2012-10-25 DIAGNOSIS — S46909A Unspecified injury of unspecified muscle, fascia and tendon at shoulder and upper arm level, unspecified arm, initial encounter: Secondary | ICD-10-CM | POA: Insufficient documentation

## 2012-10-25 DIAGNOSIS — Y92009 Unspecified place in unspecified non-institutional (private) residence as the place of occurrence of the external cause: Secondary | ICD-10-CM | POA: Insufficient documentation

## 2012-10-25 DIAGNOSIS — F411 Generalized anxiety disorder: Secondary | ICD-10-CM | POA: Insufficient documentation

## 2012-10-25 DIAGNOSIS — E669 Obesity, unspecified: Secondary | ICD-10-CM | POA: Insufficient documentation

## 2012-10-25 DIAGNOSIS — Z8679 Personal history of other diseases of the circulatory system: Secondary | ICD-10-CM | POA: Insufficient documentation

## 2012-10-25 DIAGNOSIS — Z79899 Other long term (current) drug therapy: Secondary | ICD-10-CM | POA: Insufficient documentation

## 2012-10-25 DIAGNOSIS — W19XXXA Unspecified fall, initial encounter: Secondary | ICD-10-CM

## 2012-10-25 DIAGNOSIS — F17200 Nicotine dependence, unspecified, uncomplicated: Secondary | ICD-10-CM | POA: Insufficient documentation

## 2012-10-25 DIAGNOSIS — Z8739 Personal history of other diseases of the musculoskeletal system and connective tissue: Secondary | ICD-10-CM | POA: Insufficient documentation

## 2012-10-25 DIAGNOSIS — Z880 Allergy status to penicillin: Secondary | ICD-10-CM | POA: Insufficient documentation

## 2012-10-25 DIAGNOSIS — Z7982 Long term (current) use of aspirin: Secondary | ICD-10-CM | POA: Insufficient documentation

## 2012-10-25 DIAGNOSIS — Y9389 Activity, other specified: Secondary | ICD-10-CM | POA: Insufficient documentation

## 2012-10-25 DIAGNOSIS — I10 Essential (primary) hypertension: Secondary | ICD-10-CM | POA: Insufficient documentation

## 2012-10-25 MED ORDER — DIAZEPAM 5 MG PO TABS: 5.0000 mg | ORAL_TABLET | Freq: Two times a day (BID) | ORAL | Status: DC | PRN

## 2012-10-25 MED ORDER — TRAMADOL HCL 50 MG PO TABS: 50.0000 mg | ORAL_TABLET | Freq: Four times a day (QID) | ORAL | Status: DC | PRN

## 2012-10-25 MED ORDER — DIAZEPAM 5 MG PO TABS
5.0000 mg | ORAL_TABLET | Freq: Once | ORAL | Status: AC
  Administered 2012-10-25: 5 mg via ORAL
  Filled 2012-10-25: qty 1

## 2012-10-25 NOTE — ED Notes (Signed)
Pt states that his leg went out on him last nite from previous back problems and pt landed partially on his back.  Pt is having lower, upper and shoulder pain from spasms.

## 2012-10-25 NOTE — ED Notes (Addendum)
Patient presents to the ED complaining of 10/10 pain in his back, arms, and legs. Patient alert and oriented. He states that he fell yesterday in the bathroom because he did not have his cane present with him and his legs went numb. Patient says he landed mostly on this back and side. He now says that he is experiencing spasms. The back pain has been present for over 15 years. Patient reports history of spinal stenosis. The onset of shoulder and arm pain has been within the last 9 months. Weakness in all extremities with increasing pain during movement.Vital signs are stable. Patient says that he has not taken any medication in the past few days. He reports to have used Tramadol in the past which seemed to help. Patient states that he " does not like to take Flexeril and muscle relaxants, it makes my heart do crazy things".

## 2012-10-25 NOTE — ED Provider Notes (Signed)
CSN: [id number]     Arrival date & time 10/25/12  1104 History     First MD Initiated Contact with Patient 10/25/12 1146     Chief Complaint  Patient presents with  . Fall   (Consider location/radiation/quality/duration/timing/severity/associated sxs/prior Treatment) HPI  Travis [name] is a 47 y.o. male complaining of low back pain status post slip and fall last night. Patient states that his liquid now on him last night, states that this happens occasionally from previous back issues. Patient denies any head trauma, LOC, cervicalgia, chest pain, shortness of breath. Patient is having low back pain, rated at 8/10, described as aching, exacerbated by movement and palpation. Patient also reports a left shoulder pain.   Past Medical History  Diagnosis Date  . Hypertension   . Anxiety   . Tachycardia - pulse   . Degenerative disk disease   . Spinal stenosis   . Degenerative disk disease   . Tobacco abuse   . Obesity   . Polysubstance abuse   . Chest pain     Hospital, March, 2014, negative enzymes, patient refused in-hospital  stress test, patient canceled outpatient stress test   History reviewed. No pertinent past surgical history. Family History  Problem Relation Age of Onset  . Stroke Mother   . Hypertension Mother   . Stroke Father   . Hypertension Father   . Dementia Father   . Diabetes Father   . Heart disease Father   . Cancer Sister     brain  . Heart disease Brother    History  Substance Use Topics  . Smoking status: Light Tobacco Smoker -- 0.25 packs/day    Types: Cigarettes    Last Attempt to Quit: 06/10/2012  . Smokeless tobacco: Not on file  . Alcohol Use: No    Review of Systems 10 systems reviewed and found to be negative, except as noted in the HPI   Allergies  Blueberry flavor; Other; Penicillins; and Toradol  Home Medications   Current Outpatient Rx  Name  Route  Sig  Dispense  Refill  . aspirin 81 MG EC tablet   Oral   Take 81 mg by mouth  daily.         . cloNIDine (CATAPRES) 0.2 MG tablet   Oral   Take 0.2 mg by mouth 2 (two) times daily.         . diazepam (VALIUM) 5 MG tablet   Oral   Take 5 mg by mouth every 8 (eight) hours as needed (muscle spams).         Marland Kitchen HYDROcodone-acetaminophen (NORCO/VICODIN) 5-325 MG per tablet   Oral   Take 1-2 tablets by mouth every 6 (six) hours as needed for pain.   20 tablet   0   . ibuprofen (ADVIL,MOTRIN) 200 MG tablet   Oral   Take 600 mg by mouth every 6 (six) hours as needed for pain.         Marland Kitchen lisinopril (PRINIVIL,ZESTRIL) 20 MG tablet   Oral   Take 20 mg by mouth daily.         . metoprolol tartrate (LOPRESSOR) 25 MG tablet   Oral   Take 1 tablet (25 mg total) by mouth 2 (two) times daily.   180 tablet   1   . ranitidine (ZANTAC) 75 MG tablet   Oral   Take 75 mg by mouth 2 (two) times daily as needed for heartburn.          Marland Kitchen  traMADol (ULTRAM) 50 MG tablet   Oral   Take 100 mg by mouth every 6 (six) hours as needed for pain.          BP 143/104  Pulse 76  Temp(Src) 98.3 F (36.8 C) (Oral)  Resp 18  SpO2 99% Physical Exam  Nursing note and vitals reviewed. Constitutional: He is oriented to person, place, and time. He appears well-developed and well-nourished. No distress.  HENT:  Head: Normocephalic.  Eyes: Conjunctivae and EOM are normal.  Cardiovascular: Normal rate.   Pulmonary/Chest: Effort normal. No stridor.  Abdominal: Soft.  Musculoskeletal: Normal range of motion.  Full range of motion to left shoulder, drop arm is negative, no tenderness to palpation of rotator cuff musculature. Neurovascularly intact.  Strength is 5 out of 5 to bilateral lower extremities at hip and knee, extensor hallucis longus 5 out of 5. Ankle strength 5 out of 5, no clonus, neurovascularly intact.   Neurological: He is alert and oriented to person, place, and time.  Follows commands, Goal oriented speech, Strength is 5 out of 5x4 extremities, patient  ambulates with a coordinated in nonantalgic gait. Sensation is grossly intact.    Psychiatric: He has a normal mood and affect.    ED Course   Procedures (including critical care time)  Labs Reviewed - No data to display No results found. 1. Back pain   2. Fall at home, initial encounter     MDM   Filed Vitals:   10/25/12 1112 10/25/12 1206 10/25/12 1238  BP: 180/104 143/104 147/88  Pulse: 78 76 77  Temp: 98.3 F (36.8 C)    TempSrc: Oral    Resp: 18    SpO2: 98% 99% 98%     Travis [name] is a 47 y.o. male Patient with back pain.  No neurological deficits and normal neuro exam.  Patient can walk but states is painful.  No loss of bowel or bladder control.  No concern for cauda equina.  No fever, night sweats, weight loss, h/o cancer, IVDU.  RICE protocol and pain medicine indicated and discussed with patient.    Medications  diazepam (VALIUM) tablet 5 mg (5 mg Oral Given 10/25/12 1242)    Pt is hemodynamically stable, appropriate for, and amenable to discharge at this time. Pt verbalized understanding and agrees with care plan. All questions answered. Outpatient follow-up and specific return precautions discussed.    Discharge Medication List as of 10/25/2012 12:37 PM      Note: Portions of this report may have been transcribed using voice recognition software. Every effort was made to ensure accuracy; however, inadvertent computerized transcription errors may be present    Wynetta Emery, PA-C 10/28/12 1610

## 2012-10-26 ENCOUNTER — Telehealth: Payer: Self-pay | Admitting: Family Medicine

## 2012-10-26 ENCOUNTER — Other Ambulatory Visit: Payer: Self-pay | Admitting: *Deleted

## 2012-10-26 NOTE — Telephone Encounter (Signed)
Pt called and informed. Elizabeth Vianey Caniglia, RN-BSN  

## 2012-10-26 NOTE — Telephone Encounter (Signed)
Travis [name].   Patient called inquiring about tramadol. Had to go to ED yesterday due to pain. Was told by RN that he had Rx at [facility] available. He called them at 4 45 today and states they have no record of the rx. I told patient there is little I can do in this case as it is a controlled substance and the only record in the computer states  "Reason for Refusal: Refill not appropriate Reason for Refusal Comment: He has refill to pick up at [facility],he need to use one pharmacy only ".   Told patient I cannot override message from PCP and attending physician. He has an appointment in the morning .  Travis [name]. Travis Sleigh, MD, PGY3 10/26/2012 5:36 PM

## 2012-10-27 ENCOUNTER — Encounter: Payer: Self-pay | Admitting: Family Medicine

## 2012-10-27 ENCOUNTER — Ambulatory Visit (INDEPENDENT_AMBULATORY_CARE_PROVIDER_SITE_OTHER): Payer: No Typology Code available for payment source | Admitting: Family Medicine

## 2012-10-27 ENCOUNTER — Ambulatory Visit: Payer: Self-pay

## 2012-10-27 VITALS — BP 162/126 | HR 89 | Temp 98.1°F | Wt 282.0 lb

## 2012-10-27 DIAGNOSIS — M25519 Pain in unspecified shoulder: Secondary | ICD-10-CM

## 2012-10-27 DIAGNOSIS — F17200 Nicotine dependence, unspecified, uncomplicated: Secondary | ICD-10-CM | POA: Insufficient documentation

## 2012-10-27 DIAGNOSIS — M4800 Spinal stenosis, site unspecified: Secondary | ICD-10-CM

## 2012-10-27 DIAGNOSIS — Z23 Encounter for immunization: Secondary | ICD-10-CM

## 2012-10-27 DIAGNOSIS — I10 Essential (primary) hypertension: Secondary | ICD-10-CM

## 2012-10-27 DIAGNOSIS — M25511 Pain in right shoulder: Secondary | ICD-10-CM | POA: Insufficient documentation

## 2012-10-27 DIAGNOSIS — IMO0001 Reserved for inherently not codable concepts without codable children: Secondary | ICD-10-CM | POA: Insufficient documentation

## 2012-10-27 MED ORDER — TRAMADOL HCL 50 MG PO TABS: 100.0000 mg | ORAL_TABLET | Freq: Four times a day (QID) | ORAL | Status: DC | PRN

## 2012-10-27 NOTE — Assessment & Plan Note (Signed)
Chronic back pain due to spinal stenosis>No neurologic deficit. I refilled his Tramadol,called in to the pharmacy. I made him sign pain contract,he should always get his pain medicine from [employer]. Only get pain medicine from me till he establishes care with the pain clinic. MRI spine reviewed and discussed with patient,he will benefit from intraarticular steroid injection to be done at the pain clinic. F/U with me in 2 months.

## 2012-10-27 NOTE — Patient Instructions (Addendum)

## 2012-10-27 NOTE — Assessment & Plan Note (Signed)
Etiology unclear,likely arthritis. Xray of the shoulder ordered. Tramadol prn pain.

## 2012-10-27 NOTE — Assessment & Plan Note (Addendum)
BP elevated today due to medication non-adherence. He has refill at the pharmacy to pick up at the pharmacy. I stressed the importance of compliance. Continue home BP check,if elevated despite med to follow up sooner. He verbalized understanding.

## 2012-10-27 NOTE — Assessment & Plan Note (Signed)
Smoking cessation counseling done. Pneumovax shot given today.

## 2012-10-27 NOTE — Progress Notes (Signed)
Subjective:     Patient ID: Travis [name], male   DOB: Dec 07, 1965, 47 y.o.   MRN: [id number]  HPI Back pain: patient fell 3 days ago which made his back pain worsen,he went to the ED and was discharged home on pain medicine which he is almost out of,pain is improved on Tramadol. He has been assessed by Dr Margaretha Sheffield [facility],referral to pain clinic recommended,he has an appointment in September for this pending insurance approval.Denies numbness or weakness of his limbs,no fecal or urine problem or retention. Shoulder pain: C/O B/L shoulder pain for over 9 months but worsening in the last few weeks,pain is about 10/10 in severity,worse with movement or lifting heavy object.Apart from fall from last week no trauma or injury to his shoulders. HTN:He is out of his Clonidine,did not take it this morning,he denies headache,no blurry vision,no limb weakness or facial weakness,feels well otherwise. Smoking:He continues to smokes 1 stick every other day due to stress.  Current Outpatient Prescriptions on File Prior to Visit  Medication Sig Dispense Refill  . aspirin 81 MG EC tablet Take 81 mg by mouth daily.      . cloNIDine (CATAPRES) 0.2 MG tablet Take 0.2 mg by mouth 2 (two) times daily.      . diazepam (VALIUM) 5 MG tablet Take 5 mg by mouth every 8 (eight) hours as needed (muscle spams).      Marland Kitchen HYDROcodone-acetaminophen (NORCO/VICODIN) 5-325 MG per tablet Take 1-2 tablets by mouth every 6 (six) hours as needed for pain.  20 tablet  0  . ibuprofen (ADVIL,MOTRIN) 200 MG tablet Take 600 mg by mouth every 6 (six) hours as needed for pain.      Marland Kitchen lisinopril (PRINIVIL,ZESTRIL) 20 MG tablet Take 20 mg by mouth daily.      . metoprolol tartrate (LOPRESSOR) 25 MG tablet Take 1 tablet (25 mg total) by mouth 2 (two) times daily.  180 tablet  1  . ranitidine (ZANTAC) 75 MG tablet Take 75 mg by mouth 2 (two) times daily as needed for heartburn.       . traMADol (ULTRAM) 50 MG tablet Take 100 mg by  mouth every 6 (six) hours as needed for pain.      . traMADol (ULTRAM) 50 MG tablet Take 1 tablet (50 mg total) by mouth every 6 (six) hours as needed for pain.  8 tablet  0   No current facility-administered medications on file prior to visit.   Past Medical History  Diagnosis Date  . Hypertension   . Anxiety   . Tachycardia - pulse   . Degenerative disk disease   . Spinal stenosis   . Degenerative disk disease   . Tobacco abuse   . Obesity   . Polysubstance abuse   . Chest pain     Hospital, March, 2014, negative enzymes, patient refused in-hospital  stress test, patient canceled outpatient stress test      Review of Systems  Respiratory: Negative.   Cardiovascular: Negative.   Musculoskeletal: Positive for back pain and arthralgias.  All other systems reviewed and are negative.   Filed Vitals:   10/27/12 1024  BP: 162/126  Pulse: 89  Temp: 98.1 F (36.7 C)  TempSrc: Oral  Weight: 282 lb (127.914 kg)       Objective:   Physical Exam  Nursing note and vitals reviewed. Constitutional: He is oriented to person, place, and time. He appears well-developed. No distress.  Cardiovascular: Normal rate, regular rhythm, normal  heart sounds and intact distal pulses.   No murmur heard. Pulmonary/Chest: Effort normal and breath sounds normal. No respiratory distress. He has no wheezes. He exhibits no tenderness.  Abdominal: Soft. He exhibits no distension. There is no tenderness.  Musculoskeletal: He exhibits tenderness. He exhibits no edema.       Right shoulder: He exhibits decreased range of motion and tenderness.       Left shoulder: He exhibits decreased range of motion and tenderness. He exhibits no deformity.       Lumbar back: He exhibits decreased range of motion and tenderness. He exhibits no deformity.  Ambulates with a cane as support.  Neurological: He is alert and oriented to person, place, and time. He has normal reflexes. No cranial nerve deficit.        Assessment/Plan:     Chronic back pain: Spinal stenosis B/L Shoulder pain HTN Smoking

## 2012-10-29 ENCOUNTER — Ambulatory Visit (HOSPITAL_COMMUNITY)
Admission: RE | Admit: 2012-10-29 | Discharge: 2012-10-29 | Disposition: A | Discharge status: Home / Self Care | Payer: Self-pay | Source: Ambulatory Visit | Attending: Family Medicine | Admitting: Family Medicine

## 2012-10-29 DIAGNOSIS — M25519 Pain in unspecified shoulder: Secondary | ICD-10-CM | POA: Insufficient documentation

## 2012-10-29 DIAGNOSIS — M25511 Pain in right shoulder: Secondary | ICD-10-CM

## 2012-11-02 NOTE — ED Provider Notes (Signed)
Medical screening examination/treatment/procedure(s) were performed by non-physician practitioner and as supervising physician I was immediately available for consultation/collaboration.  Papa [place]field T Savio Albrecht, MD 11/02/12 2057 

## 2012-11-11 ENCOUNTER — Encounter (HOSPITAL_COMMUNITY): Payer: Self-pay

## 2012-11-11 ENCOUNTER — Emergency Department (HOSPITAL_COMMUNITY)
Admission: EM | Admit: 2012-11-11 | Discharge: 2012-11-11 | Disposition: A | Discharge status: Home / Self Care | Payer: No Typology Code available for payment source | Attending: Emergency Medicine | Admitting: Emergency Medicine

## 2012-11-11 ENCOUNTER — Emergency Department (HOSPITAL_COMMUNITY): Payer: No Typology Code available for payment source

## 2012-11-11 DIAGNOSIS — M5380 Other specified dorsopathies, site unspecified: Secondary | ICD-10-CM | POA: Insufficient documentation

## 2012-11-11 DIAGNOSIS — F411 Generalized anxiety disorder: Secondary | ICD-10-CM | POA: Insufficient documentation

## 2012-11-11 DIAGNOSIS — I10 Essential (primary) hypertension: Secondary | ICD-10-CM | POA: Insufficient documentation

## 2012-11-11 DIAGNOSIS — F17200 Nicotine dependence, unspecified, uncomplicated: Secondary | ICD-10-CM | POA: Insufficient documentation

## 2012-11-11 DIAGNOSIS — Z7982 Long term (current) use of aspirin: Secondary | ICD-10-CM | POA: Insufficient documentation

## 2012-11-11 DIAGNOSIS — IMO0002 Reserved for concepts with insufficient information to code with codable children: Secondary | ICD-10-CM | POA: Insufficient documentation

## 2012-11-11 DIAGNOSIS — Z79899 Other long term (current) drug therapy: Secondary | ICD-10-CM | POA: Insufficient documentation

## 2012-11-11 DIAGNOSIS — M25511 Pain in right shoulder: Secondary | ICD-10-CM

## 2012-11-11 DIAGNOSIS — E669 Obesity, unspecified: Secondary | ICD-10-CM | POA: Insufficient documentation

## 2012-11-11 DIAGNOSIS — Z880 Allergy status to penicillin: Secondary | ICD-10-CM | POA: Insufficient documentation

## 2012-11-11 DIAGNOSIS — M546 Pain in thoracic spine: Secondary | ICD-10-CM | POA: Insufficient documentation

## 2012-11-11 DIAGNOSIS — M25519 Pain in unspecified shoulder: Secondary | ICD-10-CM | POA: Insufficient documentation

## 2012-11-11 LAB — BASIC METABOLIC PANEL
CO2: 27 mEq/L (ref 19–32)
Calcium: 9.2 mg/dL (ref 8.4–10.5)
Chloride: 98 mEq/L (ref 96–112)
GFR calc Af Amer: >90 mL/min (ref >90)
Sodium: 134 mEq/L — ABNORMAL LOW (ref 135–145)

## 2012-11-11 LAB — POCT I-STAT TROPONIN I: Troponin i, poc: 0 ng/mL (ref 0.00–0.08)

## 2012-11-11 LAB — CBC
Platelets: 173 10*3/uL (ref 150–400)
RBC: 5.66 MIL/uL (ref 4.22–5.81)
WBC: 11.2 10*3/uL — ABNORMAL HIGH (ref 4.0–10.5)

## 2012-11-11 LAB — D-DIMER, QUANTITATIVE: D-Dimer, Quant: 0.43 ug/mL-FEU (ref 0.00–0.48)

## 2012-11-11 MED ORDER — DIAZEPAM 5 MG PO TABS: 5.0000 mg | ORAL_TABLET | Freq: Two times a day (BID) | ORAL | Status: DC

## 2012-11-11 MED ORDER — HYDROCODONE-ACETAMINOPHEN 5-325 MG PO TABS: 2.0000 | ORAL_TABLET | ORAL | Status: DC | PRN

## 2012-11-11 NOTE — ED Notes (Signed)
ED NP at bedside

## 2012-11-11 NOTE — ED Notes (Addendum)
Pt returned from xray, phlebotomy at bedside

## 2012-11-11 NOTE — ED Notes (Signed)
Pt comfortable with d/c and f/u instructions. Prescriptions x2. 

## 2012-11-11 NOTE — ED Provider Notes (Signed)
CSN: [id number]     Arrival date & time 11/11/12  1603 History   First MD Initiated Contact with Patient 11/11/12 1659     Chief Complaint  Patient presents with  . Chest Pain   (Consider location/radiation/quality/duration/timing/severity/associated sxs/prior Treatment) Patient is a 47 y.o. male presenting with chest pain. The history is provided by the patient. No language interpreter was used.  Chest Pain Pt complain of pain centered in his upper back that radiates across his shoulder blades and radiates into his neck and arms. He describes this as a "crampy" like pain that is constant with spikes in pain with  associated back spasms. Denies chest pain, nausea, fever, difficulty breathing and shortness of breath. Reports diaphoresis when his pain increases.  He reports having back pain for the last 14 years due to spinal stenosis and degenerative disc disease with associated lower back pain. He is has a an initial appointment with the pain clinic scheduled for 02/15/2013.  Past Medical History  Diagnosis Date  . Hypertension   . Anxiety   . Tachycardia - pulse   . Degenerative disk disease   . Spinal stenosis   . Degenerative disk disease   . Tobacco abuse   . Obesity   . Polysubstance abuse   . Chest pain     Hospital, March, 2014, negative enzymes, patient refused in-hospital  stress test, patient canceled outpatient stress test   History reviewed. No pertinent past surgical history. Family History  Problem Relation Age of Onset  . Stroke Mother   . Hypertension Mother   . Stroke Father   . Hypertension Father   . Dementia Father   . Diabetes Father   . Heart disease Father   . Cancer Sister     brain  . Heart disease Brother    History  Substance Use Topics  . Smoking status: Light Tobacco Smoker -- 0.25 packs/day    Types: Cigarettes    Last Attempt to Quit: 06/10/2012  . Smokeless tobacco: Not on file  . Alcohol Use: No    Review of Systems  Cardiovascular:  Positive for chest pain.    Allergies  Blueberry flavor; Other; Penicillins; and Toradol  Home Medications   Current Outpatient Rx  Name  Route  Sig  Dispense  Refill  . aspirin 81 MG EC tablet   Oral   Take 81 mg by mouth daily.         . cloNIDine (CATAPRES) 0.2 MG tablet   Oral   Take 0.2 mg by mouth 2 (two) times daily.         . diazepam (VALIUM) 5 MG tablet   Oral   Take 5 mg by mouth every 8 (eight) hours as needed (muscle spams).         Marland Kitchen ibuprofen (ADVIL,MOTRIN) 200 MG tablet   Oral   Take 600 mg by mouth every 6 (six) hours as needed for pain.         Marland Kitchen lisinopril (PRINIVIL,ZESTRIL) 20 MG tablet   Oral   Take 20 mg by mouth daily.         . metoprolol tartrate (LOPRESSOR) 25 MG tablet   Oral   Take 1 tablet (25 mg total) by mouth 2 (two) times daily.   180 tablet   1   . ranitidine (ZANTAC) 75 MG tablet   Oral   Take 75 mg by mouth 2 (two) times daily as needed for heartburn.          Marland Kitchen  traMADol (ULTRAM) 50 MG tablet   Oral   Take 2 tablets (100 mg total) by mouth every 6 (six) hours as needed for pain.   240 tablet   4    BP 151/116  Pulse 117  Temp(Src) 98.3 F (36.8 C) (Oral)  Resp 19  SpO2 96% Physical Exam  ED Course  Procedures (including critical care time) Labs Review Labs Reviewed  CBC  BASIC METABOLIC PANEL   Imaging Review No results found.  MDM   1. Shoulder pain, bilateral   2. Hypertension    Plan: Follow-up with pain clinic appointment Return to ER if chest pain or if symptoms worsen     Irish Elders, NP 11/11/12 1922

## 2012-11-11 NOTE — ED Provider Notes (Signed)
47 year old male with a history of disability secondary to severe back pain including some chronic spinal stenosis. He has had 8 months of bilateral shoulder pain, neck pain and upper back pain. This pain radiates down both arms and is associated with muscle cramping. It is worse with any movement of the shoulders or the neck. He has been able to ambulate with his cane and uses both of his upper extremities though it does cause pain. He is currently being followed by his family doctor and has been referred to a pain clinic which she sees in December. He has not yet seen a spinal surgeon as he states that he does not have the finances nor does he have insurance to do this. He denies any worsening of his numbness, weakness or urinary incontinence. He does have things occasionally. On my exam he has a soft abdomen, clear heart and lung sounds, he is hypertensive but is taking medication for it, he has significant tenderness to palpation across his bilateral upper back including his bilateral shoulders, deltoids and biceps muscles. He states this is exactly the same pain that he is having. There is no weakness at his grips, no numbness. His laboratory workup shows a normal d-dimer, normal troponin, no acute findings on imaging. I do not think this is related to an aortic dissection, pulmonary embolism or acute coronary syndrome. He appears stable for discharge with a short description of pain medication and followup. He has expressed his understanding of the discharge instructions.  Medical screening examination/treatment/procedure(s) were conducted as a shared visit with non-physician practitioner(s) and myself.  I personally evaluated the patient during the encounter.  Clinical Impression: bialteral shoulder pain, hypertension     Vida Roller, MD 11/11/12 [phone]

## 2012-11-11 NOTE — ED Notes (Signed)
Pt c/o bilateral shoulder pain radiating to his back and around to his chest, pt reports this pain has been ongoing for 3 months seen here several times for the same. Pt denies SOB, lightheadedness, dizziness, n/v/d/

## 2012-11-11 NOTE — ED Notes (Signed)
EDNP aware of pt's BP, states ok for discharge pending papers at this time.

## 2012-11-13 NOTE — ED Provider Notes (Signed)
Medical screening examination/treatment/procedure(s) were conducted as a shared visit with non-physician practitioner(s) and myself.  I personally evaluated the patient during the encounter  Please see my separate respective documentation pertaining to this patient encounter   Vida Roller, MD 11/13/12 [phone]

## 2012-12-14 ENCOUNTER — Telehealth: Payer: Self-pay | Admitting: Family Medicine

## 2012-12-14 NOTE — Telephone Encounter (Signed)
Pt called and would like a prescription of flexeril called in to the [facility]. He has the orange card now so it will be better to have it sent there. JW

## 2012-12-14 NOTE — Telephone Encounter (Signed)
I had not previously prescribed Flexeril to him,he is also on medications that can react with Flexeril such as Clonidine and Tramadol.

## 2012-12-14 NOTE — Telephone Encounter (Signed)
Will forward to MD. Shyanne Mcclary,CMA  

## 2012-12-15 NOTE — Telephone Encounter (Signed)
Pt is aware of this.  He states that he needs his clonidine called in.  Only has 2 days left of it. Please advise since PCP is out of town until Monday.  Thanks [employer]

## 2012-12-16 MED ORDER — CLONIDINE HCL 0.2 MG PO TABS: 0.2000 mg | ORAL_TABLET | Freq: Two times a day (BID) | ORAL | Status: DC

## 2012-12-16 NOTE — Telephone Encounter (Signed)
Printed Clonidine and placed in "to be faxed" box for [facility].  Will not refill Flexeril as this was prescribed by ED physician and not PCP.

## 2012-12-28 ENCOUNTER — Other Ambulatory Visit: Payer: Self-pay | Admitting: Family Medicine

## 2013-01-02 ENCOUNTER — Telehealth: Payer: Self-pay | Admitting: Family Medicine

## 2013-01-02 MED ORDER — LISINOPRIL 20 MG PO TABS: 20.0000 mg | ORAL_TABLET | Freq: Every day | ORAL | Status: DC

## 2013-01-02 MED ORDER — METOPROLOL TARTRATE 25 MG PO TABS: 25.0000 mg | ORAL_TABLET | Freq: Two times a day (BID) | ORAL | Status: DC

## 2013-01-02 MED ORDER — CLONIDINE HCL 0.2 MG PO TABS: ORAL_TABLET | ORAL | Status: DC
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
## 2013-01-11 ENCOUNTER — Telehealth: Payer: Self-pay | Admitting: Family Medicine

## 2013-01-11 NOTE — Telephone Encounter (Signed)
Form is an FL2.  Pt states that he needs this for his pending disability.  Forward to MD and placed FL2 form in her box. Kamdon Reisig, Maryjo [place]

## 2013-01-11 NOTE — Telephone Encounter (Signed)
Pt called and stated that he was told that we need to fill out a WL1 form for him. He wasn't sure if we have it or if he has to pick it up. He will pick this up when it os ready. JW

## 2013-01-11 NOTE — Telephone Encounter (Signed)
Pt wants to pickup the form.  Jazmin (CMA) found and printed med list (as requested) and placed for pt to pick up. LMOVM informing patient. Fleeger, Maryjo [place]

## 2013-01-11 NOTE — Telephone Encounter (Signed)
Form completed and placed in the front office for faxing.

## 2013-01-14 ENCOUNTER — Ambulatory Visit (INDEPENDENT_AMBULATORY_CARE_PROVIDER_SITE_OTHER): Payer: No Typology Code available for payment source | Admitting: Family Medicine

## 2013-01-14 ENCOUNTER — Encounter: Payer: Self-pay | Admitting: Family Medicine

## 2013-01-14 VITALS — BP 135/85 | HR 76 | Temp 98.4°F | Wt 300.0 lb

## 2013-01-14 DIAGNOSIS — M4800 Spinal stenosis, site unspecified: Secondary | ICD-10-CM

## 2013-01-14 DIAGNOSIS — I10 Essential (primary) hypertension: Secondary | ICD-10-CM

## 2013-01-14 NOTE — Assessment & Plan Note (Addendum)
Stable on medication. Continue current BP regimen; Clonidine 0.2 mg TID,Metoprolol 25 mg BID and Lisinopril 20 mg qd. Continue home BP monitoring.
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
## 2013-01-14 NOTE — Patient Instructions (Addendum)
Travis [name], it was nice to see you today, we are sorry that you are having back pain and muscle spasms.  Please follow up with your Pain Clinic appointment on 02/15/13 for management of your pain. To help with your muscle spasms we recommend using a heating pad as well as Bengay cream.  For long-term relief we recommend weight loss and some of the follow exercises to help reduce your spasms.  Back Exercises Back exercises help treat and prevent back injuries. The goal of back exercises is to increase the strength of your abdominal and back muscles and the flexibility of your back. These exercises should be started when you no longer have back pain. Back exercises include:  Pelvic Tilt. Lie on your back with your knees bent. Tilt your pelvis until the lower part of your back is against the floor. Hold this position 5 to 10 sec and repeat 5 to 10 times.  Knee to Chest. Pull first 1 knee up against your chest and hold for 20 to 30 seconds, repeat this with the other knee, and then both knees. This may be done with the other leg straight or bent, whichever feels better.  Sit-Ups or Curl-Ups. Bend your knees 90 degrees. Start with tilting your pelvis, and do a partial, slow sit-up, lifting your trunk only 30 to 45 degrees off the floor. Take at least 2 to 3 seconds for each sit-up. Do not do sit-ups with your knees out straight. If partial sit-ups are difficult, simply do the above but with only tightening your abdominal muscles and holding it as directed.  Hip-Lift. Lie on your back with your knees flexed 90 degrees. Push down with your feet and shoulders as you raise your hips a couple inches off the floor; hold for 10 seconds, repeat 5 to 10 times.  Back arches. Lie on your stomach, propping yourself up on bent elbows. Slowly press on your hands, causing an arch in your low back. Repeat 3 to 5 times. Any initial stiffness and discomfort should lessen with repetition over time.  Shoulder-Lifts. Lie face  down with arms beside your body. Keep hips and torso pressed to floor as you slowly lift your head and shoulders off the floor. Do not overdo your exercises, especially in the beginning. Exercises may cause you some mild back discomfort which lasts for a few minutes; however, if the pain is more severe, or lasts for more than 15 minutes, do not continue exercises until you see your caregiver. Improvement with exercise therapy for back problems is slow.  See your caregivers for assistance with developing a proper back exercise program. Document Released: 03/28/2004 Document Revised: 05/13/2011 Document Reviewed: 12/20/2010 [facility] Patient Information 2014 [place], [place].

## 2013-01-14 NOTE — Assessment & Plan Note (Addendum)
With chronic back pain. Improves on Tramadol. Patient wants muscle relaxant but as discussed with him this can cause CNS depression in addition to his Clonidine and Tramadol; Graded back exercise,massaging and heat compression recommended. He is amendable to this. Prescription given for cane for support. F/U as scheduled with pain clinic.

## 2013-01-19 ENCOUNTER — Other Ambulatory Visit: Payer: Self-pay | Admitting: Family Medicine

## 2013-01-19 ENCOUNTER — Telehealth: Payer: Self-pay | Admitting: Family Medicine

## 2013-01-19 MED ORDER — CLONIDINE HCL 0.2 MG PO TABS: ORAL_TABLET | ORAL | Status: DC

## 2013-01-19 NOTE — Telephone Encounter (Signed)
Pt needs a refill on his clonidine. Jazmin Hartsell,CMA

## 2013-01-19 NOTE — Telephone Encounter (Signed)
Medication list has been update and disability letter is available on epic for patient. Thanks.

## 2013-01-19 NOTE — Telephone Encounter (Signed)
Refilled

## 2013-01-19 NOTE — Telephone Encounter (Signed)
Pt called because he said the information on the forms that [name] filled out are incorrect. He says that he was in the hospital this year. The medications on the list are incorrect. He did get some valium and hydrocodone but that was a one time thing. He needs all this corrected before Thursday 11/20. He has a hearing with disability. He also still needs a letter from [name] stating that he is disabled. JW

## 2013-01-21 ENCOUNTER — Telehealth: Payer: Self-pay | Admitting: Family Medicine

## 2013-01-21 NOTE — Telephone Encounter (Signed)
He has a disabiltiy hearing at 12 noon today. Please call at 3 or after

## 2013-01-21 NOTE — Telephone Encounter (Signed)
"  About a week and half ago, he strained to go to the bathroom. everytime he eats and is going thru his system, he has pain about his rectum This is an everyday pain. This is not back pain"

## 2013-01-21 NOTE — Telephone Encounter (Signed)
Please have patient schedule follow up if not feeling ok.

## 2013-01-25 ENCOUNTER — Encounter: Payer: Self-pay | Admitting: Family Medicine

## 2013-01-25 ENCOUNTER — Ambulatory Visit (INDEPENDENT_AMBULATORY_CARE_PROVIDER_SITE_OTHER): Payer: No Typology Code available for payment source | Admitting: Family Medicine

## 2013-01-25 VITALS — BP 148/97 | HR 67 | Temp 98.1°F | Ht 71.0 in | Wt 299.0 lb

## 2013-01-25 DIAGNOSIS — R1032 Left lower quadrant pain: Secondary | ICD-10-CM

## 2013-01-25 NOTE — Progress Notes (Signed)
Subjective:     Patient ID: Travis [name], male   DOB: [birth year], 47 y.o.   MRN: [id number]  HPI 47 y.o. M here for evaluation of straining when having a bowel movement. Pt reports that 11 November pt was having a BM and was straining. Then he felt a pain below his stomach. It is a cramping sensation that occurs 3-4 hrs after he eats. Moves some in his abdomen. Lasts 1-2 hrs. Worse with sharp foods like hamburger meat, Improves with laying down. Now taking regular stool softner and having soft BMs. Never had similar symptoms. No blood in stool.   Review of Systems No fevers or chills, No chest pain, SOB, no n/v, no d/c, no urinary symptoms but has saddle numbness. Occassional dribble after urination at base line.    Objective:   Physical Exam Filed Vitals:   01/25/13 1042  BP: 148/97  Pulse: 67  Temp: 98.1 F (36.7 C)  VSS - pt reports close to baseline, has taken meds today NAD CTAB NO WRC RRR no mgt Obese abdomen, minimal tenderness on midline and LLQ, no rebound No evidence of hernia on palpation or inspection     Assessment:     47 y.o. M with abdominal pain following eating. No evidence of obstruction or hernia at this time. Likely related to muscle strain with BM. Recommend continue stool softner, bland diet and monitor for continued improvement. Pt states understanding and reviewed risks and sx of hernia or obstruction. Pt will return to clinic or ED if worsening symtoms. Expect resolution over next 2-3 weeks.  Tawana Scale, MD OB Fellow

## 2013-02-15 ENCOUNTER — Encounter: Payer: Self-pay | Admitting: Physical Medicine & Rehabilitation

## 2013-02-15 ENCOUNTER — Encounter: Payer: No Typology Code available for payment source | Attending: Physical Medicine & Rehabilitation

## 2013-02-15 ENCOUNTER — Telehealth: Payer: Self-pay | Admitting: Family Medicine

## 2013-02-15 ENCOUNTER — Ambulatory Visit (HOSPITAL_BASED_OUTPATIENT_CLINIC_OR_DEPARTMENT_OTHER): Payer: No Typology Code available for payment source | Admitting: Physical Medicine & Rehabilitation

## 2013-02-15 VITALS — BP 167/117 | HR 103 | Resp 16 | Ht 71.5 in | Wt 297.0 lb

## 2013-02-15 DIAGNOSIS — E669 Obesity, unspecified: Secondary | ICD-10-CM | POA: Insufficient documentation

## 2013-02-15 DIAGNOSIS — F17200 Nicotine dependence, unspecified, uncomplicated: Secondary | ICD-10-CM | POA: Insufficient documentation

## 2013-02-15 DIAGNOSIS — I10 Essential (primary) hypertension: Secondary | ICD-10-CM | POA: Insufficient documentation

## 2013-02-15 DIAGNOSIS — M48061 Spinal stenosis, lumbar region without neurogenic claudication: Secondary | ICD-10-CM | POA: Insufficient documentation

## 2013-02-15 DIAGNOSIS — M5430 Sciatica, unspecified side: Secondary | ICD-10-CM

## 2013-02-15 DIAGNOSIS — M48062 Spinal stenosis, lumbar region with neurogenic claudication: Secondary | ICD-10-CM

## 2013-02-15 NOTE — Patient Instructions (Signed)
Will do epidural steroid injection starting at L5-S1 Please see preinjection instructions.  Will need a driver  If no significant relief from L5-S1, will move up to L4-L5

## 2013-02-15 NOTE — Telephone Encounter (Signed)
Patient seen at Pain clinic and was told that they would only see him for the "back pain" and to give her cortisone injections. They will not see him for his "cramps in his back". He would like [name] to refer him somewhere they will treat him for his entire back issue. Please call patient.
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
## 2013-03-16 ENCOUNTER — Other Ambulatory Visit: Payer: Self-pay | Admitting: Family Medicine

## 2013-03-16 ENCOUNTER — Telehealth: Payer: Self-pay | Admitting: Family Medicine

## 2013-03-16 MED ORDER — CLONIDINE HCL 0.2 MG PO TABS: ORAL_TABLET | ORAL | Status: DC

## 2013-03-16 NOTE — Telephone Encounter (Signed)
Thanks, I will send in his refill again. I appreciate your help Jazmin

## 2013-03-16 NOTE — Telephone Encounter (Signed)
Pt states that he has already informed you that he has lost his medication a few times since this was first filled and had to get them filled again early.  Verified this with pharmacy and he used all 4 refills within a 30 day span.  Pt is out of refills at pharmacy.  Please advise. Selma Rodelo,CMA

## 2013-03-16 NOTE — Telephone Encounter (Signed)
Pt is aware that rx was sent to pharmacy. Quindarrius Joplin,CMA  

## 2013-03-16 NOTE — Telephone Encounter (Signed)
Pt called and needs a refill on his Clonidine sent to his pharmacy. He is currently out of this medication. jw

## 2013-03-16 NOTE — Telephone Encounter (Signed)
Patient has refill till march  Prescribing Provider Encounter Provider   Travis PaganKehinde Tannie Koskela, MD Travis PaganKehinde Lavergne Hiltunen, MD         Medication Detail      Disp Refills Start End     cloNIDine (CATAPRES) 0.2 MG tablet 60 tablet 4 01/19/2013     Sig: TAKE ONE TABLET BY MOUTH TWICE DAILY    E-Prescribing Status: Receipt confirmed by pharmacy (01/19/2013 12:19 PM EST)

## 2013-03-17 ENCOUNTER — Other Ambulatory Visit: Payer: Self-pay | Admitting: Family Medicine

## 2013-03-18 ENCOUNTER — Telehealth: Payer: Self-pay | Admitting: Family Medicine

## 2013-03-18 ENCOUNTER — Other Ambulatory Visit: Payer: Self-pay | Admitting: Family Medicine

## 2013-03-18 MED ORDER — TRAMADOL HCL 50 MG PO TABS: 100.0000 mg | ORAL_TABLET | Freq: Four times a day (QID) | ORAL | Status: DC | PRN

## 2013-03-18 MED ORDER — METOPROLOL TARTRATE 25 MG PO TABS: 25.0000 mg | ORAL_TABLET | Freq: Two times a day (BID) | ORAL | Status: DC

## 2013-03-18 NOTE — Telephone Encounter (Signed)
Needs refills on tramadol and metoprolol [facility] [address]

## 2013-03-18 NOTE — Telephone Encounter (Signed)
Refill done to his pharmacy.

## 2013-03-18 NOTE — Telephone Encounter (Signed)
Pt is aware.  Jazmin Hartsell,CMA  

## 2013-03-19 ENCOUNTER — Telehealth: Payer: Self-pay | Admitting: Family Medicine

## 2013-03-19 NOTE — Telephone Encounter (Signed)
Pt is not sure if this can be done since he has a pain contract with you.  Please advise.  If you are ok with this and it is after 5pm can you call [facility] [address]?

## 2013-03-19 NOTE — Telephone Encounter (Signed)
Pt called and would like his tramadol called in to a different pharmacy because [facility] closed on Sundays. Please call him to get the name of the pharmacy that he wants to us. j w

## 2013-03-23 ENCOUNTER — Ambulatory Visit (HOSPITAL_BASED_OUTPATIENT_CLINIC_OR_DEPARTMENT_OTHER): Payer: No Typology Code available for payment source | Admitting: Physical Medicine & Rehabilitation

## 2013-03-23 ENCOUNTER — Encounter: Payer: No Typology Code available for payment source | Attending: Physical Medicine & Rehabilitation

## 2013-03-23 ENCOUNTER — Encounter: Payer: Self-pay | Admitting: Physical Medicine & Rehabilitation

## 2013-03-23 VITALS — BP 170/91 | HR 83 | Resp 14 | Ht 71.0 in | Wt 303.0 lb

## 2013-03-23 DIAGNOSIS — M48061 Spinal stenosis, lumbar region without neurogenic claudication: Secondary | ICD-10-CM | POA: Insufficient documentation

## 2013-03-23 DIAGNOSIS — F17200 Nicotine dependence, unspecified, uncomplicated: Secondary | ICD-10-CM | POA: Insufficient documentation

## 2013-03-23 DIAGNOSIS — IMO0002 Reserved for concepts with insufficient information to code with codable children: Secondary | ICD-10-CM

## 2013-03-23 DIAGNOSIS — E669 Obesity, unspecified: Secondary | ICD-10-CM | POA: Insufficient documentation

## 2013-03-23 DIAGNOSIS — I10 Essential (primary) hypertension: Secondary | ICD-10-CM | POA: Insufficient documentation

## 2013-03-23 DIAGNOSIS — M5416 Radiculopathy, lumbar region: Secondary | ICD-10-CM

## 2013-03-23 NOTE — Patient Instructions (Signed)

## 2013-03-23 NOTE — Progress Notes (Signed)
  PROCEDURE RECORD [facility]   Name: Travis [name] DOB:Aug 31, 1965 MRN: [id number]  Date:03/23/2013  Physician: Claudette LawsAndrew Kirsteins, MD    Nurse/CMA: Wynn MaudlinLevens,CMA(AAMA)  Allergies:  Allergies  Allergen Reactions  . Blueberry Flavor Hives  . Other     Muscle relaxants cause rapid heart rate   . Penicillins Hives, Itching and Other (See Comments)    Hallucinations.  . Toradol [Ketorolac Tromethamine] Hives and Other (See Comments)    Headache    Consent Signed: yes  Is patient diabetic? no   Pregnant: no LMP: No LMP for male patient. (age 46-55)  Anticoagulants: no Anti-inflammatory: no Antibiotics: no  Procedure: Translaminar Epidural steroid injection  Position: Prone Start Time:  244 End Time: 252  Fluoro Time: 21  RN/CMA Lomax Poehler,CMA Genavive Kubicki,CMA    Time 232 254    BP 170/91 154/104    Pulse 83 91    Respirations 14 16    O2 Sat 98 98    S/S 6 6    Pain Level 7/10 7/10     D/C home with Mother Travis [name], patient A & O X 3, D/C instructions reviewed, and sits independently.

## 2013-03-23 NOTE — Progress Notes (Signed)
Lumbar epidural steroid injection under fluoroscopic guidance  Indication: Lumbosacral radiculitis is not relieved by medication management or other conservative care and interfering with self-care and mobility.  Informed consent was obtained after describing risk and benefits of the procedure with the patient, this includes bleeding, bruising, infection, paralysis and medication side effects.  The patient wishes to proceed and has given written consent.  Patient was placed in a prone position.  The lumbar area was marked and prepped with Betadine.  It was entered with a 25-gauge 1-1/2 inch needle and one mL of 1% lidocaine was injected into the skin and subcutaneous tissue.  Then a 17-gauge spinal needle was inserted under fluoroscopic guidance into the L5-S1 interlaminar space under AP and Lateral imaging.  Once needle tip of approximated the posterior elements, a loss of resistance technique was utilized with lateral imaging.  A positive loss of resistance was obtained and then confirmed by injecting 2 mL's of Omnipaque 180.  Then a solution containing 1.5 mL's of 6mg /ml and 2.5 mL's of 1% lidocaine was injected.  The patient tolerated procedure well.  Post procedure instructions were given.  Please see post procedure form.  Preinjection pain 7/10 Post injection pain 7/10  Patient will followup with sports medicine, if no significant relief consider repeat injection at L4-L5

## 2013-03-25 ENCOUNTER — Telehealth: Payer: Self-pay | Admitting: Family Medicine

## 2013-03-25 NOTE — Telephone Encounter (Signed)
Received steriod injection in back yesterday. Woke up in middle of night with left arm hurting and now has a fever in it. It is bright red and he is bright red in the face. [facility] was at [facility] Please advise He can be reached at this cell phone today. Cell: [phone]

## 2013-03-25 NOTE — Telephone Encounter (Signed)
Patient call after hours line tonight. He reports he had his  1st L4 epidural 2 days ago, with Dr. Fritzi MandesKirsten, At [facility], for chronic back pain. Last night he states that he woke up in the middle of the night with a warm sensation in his face and arm. He states is arm and face appeared red. This morning he woke with pain in the middle of his forearm. He states it felt like the pain was in the bone. He called the doctor that performed the epidural and was advised to call his primary. He was unable to get a hold of his primary today. The pain it still in his forearm, but he states that it now has moved up to his elbow as well. He muscles in that arm hurt. He feels like it is swelling and fingers feel tight in that arm. He denies fevers, trauma, insect bites or laying on that arm for long periods of time. He endorses open sores on that arm, but does not think any are infected. Denies numbness or tingling. He states it feels a little better with an ACE wrap. His hand on that side is also more red than his other hand. I explained possible concern for infection, RSD, blood clot formation in his arm or even compartment syndrome. Advised him to come to ED for evaluation immediately.  Travis [name], Renee DO PGY-2 [facility].

## 2013-03-26 NOTE — Telephone Encounter (Signed)
Patient states he spoke with MD on call and was advised to schedule an appt.  Had "problems with arm."  Declined offer to go to UC or ED since "pain has eased up some."  Appt scheduled for 03/29/13 at 3:00 pm.  Patient informed to go to ED or UC if pain worsens and verbalized understanding.  Gaylene Brooksichardson, Jeannette Ann, RN

## 2013-03-29 ENCOUNTER — Encounter: Payer: Self-pay | Admitting: Family Medicine

## 2013-03-29 ENCOUNTER — Ambulatory Visit (INDEPENDENT_AMBULATORY_CARE_PROVIDER_SITE_OTHER): Payer: No Typology Code available for payment source | Admitting: Family Medicine

## 2013-03-29 VITALS — BP 160/100 | HR 79 | Ht 71.0 in | Wt 299.0 lb

## 2013-03-29 DIAGNOSIS — M79609 Pain in unspecified limb: Secondary | ICD-10-CM

## 2013-03-29 DIAGNOSIS — M79602 Pain in left arm: Secondary | ICD-10-CM | POA: Insufficient documentation

## 2013-03-29 LAB — CBC WITH DIFFERENTIAL/PLATELET
Basophils Absolute: 0.1 10*3/uL (ref 0.0–0.1)
Basophils Relative: 0 % (ref 0–1)
EOS PCT: 2 % (ref 0–5)
Eosinophils Absolute: 0.2 10*3/uL (ref 0.0–0.7)
HEMATOCRIT: 50.1 % (ref 39.0–52.0)
HEMOGLOBIN: 17.2 g/dL — AB (ref 13.0–17.0)
LYMPHS PCT: 27 % (ref 12–46)
Lymphs Abs: 4.4 10*3/uL — ABNORMAL HIGH (ref 0.7–4.0)
MCH: 29.2 pg (ref 26.0–34.0)
MCHC: 34.3 g/dL (ref 30.0–36.0)
MCV: 84.9 fL (ref 78.0–100.0)
MONO ABS: 1.3 10*3/uL — AB (ref 0.1–1.0)
MONOS PCT: 8 % (ref 3–12)
NEUTROS ABS: 10.3 10*3/uL — AB (ref 1.7–7.7)
Neutrophils Relative %: 63 % (ref 43–77)
Platelets: 246 10*3/uL (ref 150–400)
RBC: 5.9 MIL/uL — ABNORMAL HIGH (ref 4.22–5.81)
RDW: 13.8 % (ref 11.5–15.5)
WBC: 16.2 10*3/uL — ABNORMAL HIGH (ref 4.0–10.5)

## 2013-03-29 LAB — POCT SEDIMENTATION RATE: POCT SED RATE: 6 mm/hr (ref 0–22)

## 2013-03-29 LAB — BASIC METABOLIC PANEL
BUN: 13 mg/dL (ref 6–23)
CHLORIDE: 97 meq/L (ref 96–112)
CO2: 31 mEq/L (ref 19–32)
Calcium: 9.4 mg/dL (ref 8.4–10.5)
Creat: 0.99 mg/dL (ref 0.50–1.35)
GLUCOSE: 102 mg/dL — AB (ref 70–99)
POTASSIUM: 4.1 meq/L (ref 3.5–5.3)
Sodium: 135 mEq/L (ref 135–145)

## 2013-03-29 MED ORDER — DOXYCYCLINE HYCLATE 100 MG PO TABS: 100.0000 mg | ORAL_TABLET | Freq: Two times a day (BID) | ORAL | Status: DC

## 2013-03-29 MED ORDER — HYDROCODONE-ACETAMINOPHEN 10-325 MG PO TABS: 1.0000 | ORAL_TABLET | Freq: Four times a day (QID) | ORAL | Status: DC | PRN

## 2013-03-29 NOTE — Patient Instructions (Signed)
Mr. Freida BusmanDalton,  Thank you for coming in today. I am most concerned about cellulitis or myositis (soft tissue infection in your arm) For this reason: 1. Antibiotics: take doxycycline with a full glass of water. Wait at least 30 minutes after taking it to lay down.  2. Pain control: with short course of Vicodin. 3. Labs work  4. Close f/u with Dr. Lum BabeEniola or myself in 2-3 days call in sooner if you have worsening pain, swelling or develop fever.  Dr. Armen PickupFunches

## 2013-03-29 NOTE — Progress Notes (Signed)
   Subjective:    Patient ID: Travis [name], male    DOB: Sep 21, 1965, 48 y.o.   MRN: [id number] CC:  'Pain, redness and swelling in left arm' HPI: Patient received steroid spinal epidural on 03/25/13 for chronic back pain.  Patient awake the same night after receiving epidural with pain in left arm, radiating from shoulder to hand.  Patient describes left arm as inflamed, red, and hot to touch and pain level at 10/10. Patient currently taking Ultram for pain.    Review of Systems As per HPI      Objective:   Physical Exam: Positive radial pulses. Limited ROM in left arm.  Pain with both active and passive ROM. Arm minimal redness, scattered sores over entire left arm.   Heart sounds positive for S1 and S2. Lung sounds equal and regular, with no adventitious sound.     Assessment & Plan:  Plan:   Baseline CBC with diff, BMET Doxycycline 100mg  capsules BID X 10 days #20. Vicodin 100ng tables BID X 10 days #20. Sed Rate  Attending Addendum  I examined the patient and discussed the assessment and plan with Travis Student Raliegh IpNatalie Teriana Danker, RN. I have reviewed the note and agree. Additions are in blue above and in black below.  Briefly, 48 yo M patient with history of chronic back pain and polysubstance abuse  presents with acute onset of L arm pain from anterior shoulder to hand. Pain started on the evening of 03/24/13 and woke patient from sleep. Pain was associated with diffuse redness, swelling and warmth in extremity. Patient denies associated fever and chills.  Patient denies inciting injury, bug or animal bite, IV drug use or history of similar episodes previously. Of note, patient did have a L4 spinal epidural injection on 03/23/2013.   Patient reports pain has improved overall but he still experiences intermittent episode of pain, redness, swelling and warmth since 03/24/13. These episodes last for 1-2 hrs and occurs throughout the day w/o obvious trigger. He had one episode today. Patient  reports pain is currently 10/10. No improvement with tramadol.   BP 160/100  Pulse 79  Ht 5\' 11"  (1.803 m)  Wt 299 lb (135.626 kg)  BMI 41.72 kg/m2 General appearance: alert, cooperative and no distress Back: symmetric, no curvature. ROM normal. No CVA tenderness. Heart: regular rate and rhythm, S1, S2 normal, no murmur, click, rub or gallop Extremities: L arm with moderate swelling compared to R. Scattered papules over arm and forearm.  Blanching erythema and warmth. Tense skin. No fluctuance. TTP along arm and hand. 4/5 grip strength. 1+ radial pulse. No L axillary adenopathy.   Lab Results  Component Value Date   POCTSEDRATE 6 03/29/2013       Dessa PhiFUNCHES,JOSALYN, MD [facility]

## 2013-03-29 NOTE — Assessment & Plan Note (Signed)
A: L arm pain with swelling and erythema. Most concerning for infectious process, like cellulitus or myositis. Overall improving per patient. Other differentials: allergic reaction, unlikely given distribution. Occult fracture. Unlikely given lack of reported injury. P: Treat with doxycycline-covers MRSA vicodin for pain control  eval with sed rate-normal Cbc with diff and BMP Close f/u in 2-3 days Gave precautions per AVS CT limb of there is not significant improvement with rule out abscess.

## 2013-03-30 ENCOUNTER — Telehealth: Payer: Self-pay | Admitting: Family Medicine

## 2013-03-30 NOTE — Telephone Encounter (Signed)
Patient called Elevated WBC with neutrophil predominance Be sure to take antibiotic Make f/u appt with PCP  Normal BMP and sed rate. Call with questions.

## 2013-03-31 ENCOUNTER — Telehealth: Payer: Self-pay | Admitting: Family Medicine

## 2013-03-31 NOTE — Telephone Encounter (Signed)
Patient called back. He wanted to let me know  That he did obtain the doxycycline and the vicodin. He is taking both. Still has pain but overall a bit better. He thanked me for the care. F/u was scheduled with Dr. Lum BabeEniola for 04/08/13 at 1:45 PM.

## 2013-04-02 ENCOUNTER — Telehealth: Payer: Self-pay | Admitting: Family Medicine

## 2013-04-02 ENCOUNTER — Encounter: Payer: Self-pay | Admitting: Family Medicine

## 2013-04-02 ENCOUNTER — Ambulatory Visit (INDEPENDENT_AMBULATORY_CARE_PROVIDER_SITE_OTHER): Payer: No Typology Code available for payment source | Admitting: Family Medicine

## 2013-04-02 VITALS — BP 136/91 | HR 85 | Temp 98.2°F | Resp 18 | Wt 303.0 lb

## 2013-04-02 DIAGNOSIS — M79602 Pain in left arm: Secondary | ICD-10-CM

## 2013-04-02 DIAGNOSIS — H5710 Ocular pain, unspecified eye: Secondary | ICD-10-CM

## 2013-04-02 DIAGNOSIS — M79609 Pain in unspecified limb: Secondary | ICD-10-CM

## 2013-04-02 DIAGNOSIS — H5712 Ocular pain, left eye: Secondary | ICD-10-CM

## 2013-04-02 MED ORDER — ERYTHROMYCIN 5 MG/GM OP OINT: TOPICAL_OINTMENT | OPHTHALMIC | Status: DC

## 2013-04-02 MED ORDER — TRAMADOL HCL 50 MG PO TABS: 100.0000 mg | ORAL_TABLET | Freq: Four times a day (QID) | ORAL | Status: DC | PRN

## 2013-04-02 NOTE — Telephone Encounter (Signed)
Appt made for Dr Durene CalHunter [facility] this afternoon. Sadie Pickar, Maryjo RochesterJessica Dawn

## 2013-04-02 NOTE — Patient Instructions (Addendum)
Glad the arm pain is better. We gave you some tramadol to get you through to see Dr. Lum BabeEniola.   We did not see any scratch in the eye. If you develop fever, pain with moving your eyes, blurry vision, then we want you to go to the ED. If these things happen next week, call for earlier appointment, otherwise follow up with Dr. Lum BabeEniola. We are going to treat you for a bacterial eye infection given some redness in your eye and the yellowish discharge you are having.   Bacterial Conjunctivitis Bacterial conjunctivitis (commonly called pink eye) is redness, soreness, or puffiness (inflammation) of the white part of your eye. It is caused by a germ called bacteria. These germs can easily spread from person to person (contagious). Your eye often will become red or pink. Your eye may also become irritated, watery, or have a thick discharge.  HOME CARE   Apply a cool, clean washcloth over closed eyelids. Do this for 10 20 minutes, 3 4 times a day while you have pain.  Gently wipe away any fluid coming from the eye with a warm, wet washcloth or cotton ball.  Wash your hands often with soap and water. Use paper towels to dry your hands.  Do not share towels or washcloths.  Change or wash your pillowcase every day.  Do not use eye makeup until the infection is gone.  Do not use machines or drive if your vision is blurry.  Stop using contact lenses. Do not use them again until your doctor says it is okay.  Do not touch the tip of the eye drop bottle or medicine tube with your fingers when you put medicine on the eye. GET HELP RIGHT AWAY IF:   Your eye is not better after 3 days of starting your medicine.  You have a yellowish fluid coming out of the eye.  You have more pain in the eye.  Your eye redness is spreading.  Your vision becomes blurry.  You have a fever or lasting symptoms for more than 2-3 days.  You have a fever and your symptoms suddenly get worse.  You have pain in the  face.  Your face gets red or puffy (swollen). MAKE SURE YOU:   Understand these instructions.  Will watch this condition.  Will get help right away if you are not doing well or get worse. Document Released: 11/28/2007 Document Revised: 02/05/2012 Document Reviewed: 11/28/2007 [facility] Patient Information 2014 [employer] [place].

## 2013-04-02 NOTE — Progress Notes (Signed)
Travis Conch, MD Phone: [phone]  Subjective:  Chief complaint-noted  Left eye pain (also following up for Left arm cellulitis) Patient was treated for left arm cellulitis 4 days ago with doxycycline. States arm is feeling much better and swelling and redness have gone down. He is about to run out of tramadol and norco which he has been taking for pain. 2 days ago, patient states he woke up with yellow or greenish discharge in his left eye. He wiped his eye and the lower lid felt very tender. He denies foreign body exposure. He thinks his eye is only slightly red. The eye itself does not hurt but if he pushes on the lower lid it is very painful. No radiation of pain. Stable over last 2 days.   ROS-He denies blurry vision, double vision, pain with eye movement, fever floaters.  Past Medical History-chronic low back pain, hypertension, anxiety, tobacco abuse, history of substance abuse, HLD, depression, GERd.    Medications- reviewed and updated Current Outpatient Prescriptions  Medication Sig Dispense Refill  . aspirin 81 MG EC tablet Take 81 mg by mouth daily.      . cloNIDine (CATAPRES) 0.2 MG tablet TAKE ONE TABLET BY MOUTH TWICE DAILY  60 tablet  5  . doxycycline (VIBRA-TABS) 100 MG tablet Take 1 tablet (100 mg total) by mouth 2 (two) times daily.  20 tablet  0  . erythromycin ophthalmic ointment One-half inch (1.25 cm) to left eye four times daily for 5 to 7 days  3.5 g  0  . HYDROcodone-acetaminophen (NORCO) 10-325 MG per tablet Take 1 tablet by mouth every 6 (six) hours as needed.  20 tablet  0  . lisinopril (PRINIVIL,ZESTRIL) 20 MG tablet Take 1 tablet (20 mg total) by mouth daily.  30 tablet  0  . metoprolol tartrate (LOPRESSOR) 25 MG tablet Take 1 tablet (25 mg total) by mouth 2 (two) times daily.  60 tablet  6  . ranitidine (ZANTAC) 75 MG tablet Take 75 mg by mouth 2 (two) times daily as needed for heartburn.       . traMADol (ULTRAM) 50 MG tablet Take 2 tablets (100 mg  total) by mouth every 6 (six) hours as needed.  45 tablet  0   No current facility-administered medications for this visit.    Objective: BP 136/91  Pulse 85  Temp(Src) 98.2 F (36.8 C) (Oral)  Resp 18  Wt 303 lb (137.44 kg)  SpO2 98% Gen: NAD, resting comfortably HEENT: TM normal bilaterally, no facial swelling or asymetry, oropharynx normal (few teeth but none on upper side and no signs of infection). No pain with palpation of sinuses.  Eye: slight erythema in conjunctiva and lower sclera. Fluorescein eye stain performed with no signs of corneal or scleral abrasion.  Vision 20/60 in Left and 20/25 in right.  Extremity: left arm unwrapped and minimal erythema or edema noted. No pain to palpation.  Neuro: CN II-XII intact. No pain with eye movement during this exam.   Assessment/Plan:  Left Eye Pain No foregin body, no scleral or corneal abrasion. No eye pain or reported vision difficulties. Vision Testing was decreased in the left compared to right but this was not reported atypical per patient. Concern this may be a bacterial conjuctivitis that is just particularly tender. For this reason, treated with erythromycin x 5-7 days. Initially worry was for preseptal or orbital cellulitis but no redness around the area and no pain with eye movement so doubt this is unlikely. Also  already on doxycycline when this develops which I would think would make that less likely. Warning signs for return discussed and has visit next week with Dr. Lum BabeEniola.   Left arm pain Treated as cellulitis with doxycycline. Exam much improved today. As exam much improved as well as reported pain, will try to wean back to tramadol and off of the prescribed norco.    Meds ordered this encounter  Medications  . traMADol (ULTRAM) 50 MG tablet    Sig: Take 2 tablets (100 mg total) by mouth every 6 (six) hours as needed.    Dispense:  45 tablet    Refill:  0  . erythromycin ophthalmic ointment    Sig: One-half inch  (1.25 cm) to left eye four times daily for 5 to 7 days    Dispense:  3.5 g    Refill:  0

## 2013-04-02 NOTE — Telephone Encounter (Signed)
Pt now has pain in eye-real sore like somebody hit me in the eye. Pain in arm is not getting any better. Out of pain med-" i know i will be hurting this weekend and I dont want to go to the emergency room" Please advise

## 2013-04-02 NOTE — Telephone Encounter (Signed)
Please have patient come in to be seen today for same day visit,if not possible to use tramadol prn or go to the ED. We do not want him to be on Narcotic chronically if not necessary.

## 2013-04-02 NOTE — Assessment & Plan Note (Signed)
Treated as cellulitis with doxycycline. Exam much improved today. As exam much improved as well as reported pain, will try to wean back to tramadol and off of the prescribed norco.

## 2013-04-05 ENCOUNTER — Ambulatory Visit: Payer: Self-pay

## 2013-04-06 ENCOUNTER — Other Ambulatory Visit: Payer: Self-pay | Admitting: *Deleted

## 2013-04-06 ENCOUNTER — Other Ambulatory Visit: Payer: Self-pay | Admitting: Family Medicine

## 2013-04-06 ENCOUNTER — Telehealth: Payer: Self-pay | Admitting: Family Medicine

## 2013-04-06 MED ORDER — HYDROCHLOROTHIAZIDE 25 MG PO TABS: 25.0000 mg | ORAL_TABLET | Freq: Every day | ORAL | Status: DC

## 2013-04-06 MED ORDER — CHLORTHALIDONE 50 MG PO TABS: 50.0000 mg | ORAL_TABLET | Freq: Every day | ORAL | Status: DC

## 2013-04-06 MED ORDER — CHLORTHALIDONE 25 MG PO TABS: 25.0000 mg | ORAL_TABLET | Freq: Every day | ORAL | Status: DC

## 2013-04-06 NOTE — Telephone Encounter (Signed)
I got a call from the pharmacist, Chlorthalidone would cost patient $80, I suggested we switch him to HCTZ 25 mg qd.

## 2013-04-06 NOTE — Telephone Encounter (Addendum)
Patient calling to request an early refill on clonidine.  States his pills got wet in his bag and are now "mushy."  Just picked up med last week and too soon for another refill unless we authorize it.  Spoke with pharmacist and patient will have to pay $8.  Will route refill request to Dr. Lum BabeEniola and call patient back.  Altamese Dilling~Srah Ake, BSN, RN-BC

## 2013-04-06 NOTE — Telephone Encounter (Signed)
I called and spoke with [facility] pharmacist, plan to d/c clonidine and start Chlorthalidone 25 mg qd.

## 2013-04-06 NOTE — Telephone Encounter (Addendum)
Patient informed that clonidine has been d/c'd per Dr. Lum BabeEniola and changed to Chlorthalidone. Can pick up Rx later today at pharmacy.   Patient already has an appt scheduled on 04/08/13.  Altamese Dilling~Jeannette Richardson, BSN, RN-BC

## 2013-04-06 NOTE — Telephone Encounter (Signed)
Please let patient know we are changing his Clonidine to Chlorthalidone. He has called multiple times with hx of misplacing or losing his Clonidine, his other BP medications never gets stolen. I will send to his pharmacy Chlorthalidone and let them know we are d/c his Clonidine altogether. Advise patient to pick up his Chlorthalidone and schedule follow up with me for BP reassessment.

## 2013-04-06 NOTE — Telephone Encounter (Addendum)
[facility] pharmacist calling our office--Patient has habit of getting clonidine refill every 2 weeks.  Unsure if he is abusing med.  Will fax printout to our office for PCP to review.  Altamese Dilling~Winn Muehl, BSN, RN-BC

## 2013-04-07 ENCOUNTER — Telehealth: Payer: Self-pay | Admitting: Family Medicine

## 2013-04-07 NOTE — Telephone Encounter (Signed)
I just spoke with patient and advised him to take medication as instructed,his most recent BP was 160/90 as per patient,he has appointment to see me tomorrow, I will adjust his medications appropriately tomorrow.

## 2013-04-07 NOTE — Telephone Encounter (Signed)
Pt called because the medication that he was put on for high BP is not working. He is still having high BP. jw

## 2013-04-08 ENCOUNTER — Encounter: Payer: Self-pay | Admitting: Family Medicine

## 2013-04-08 ENCOUNTER — Ambulatory Visit (INDEPENDENT_AMBULATORY_CARE_PROVIDER_SITE_OTHER): Payer: Self-pay | Admitting: Family Medicine

## 2013-04-08 VITALS — BP 180/129 | HR 77 | Temp 97.6°F | Wt 296.0 lb

## 2013-04-08 DIAGNOSIS — I10 Essential (primary) hypertension: Secondary | ICD-10-CM

## 2013-04-08 MED ORDER — CLONIDINE HCL 0.2 MG PO TABS: 0.2000 mg | ORAL_TABLET | Freq: Two times a day (BID) | ORAL | Status: DC

## 2013-04-08 NOTE — Assessment & Plan Note (Signed)
As discussed with patient, I am concern of him abusing his Clonidine since he has reported his medication missing or stolen on multiple occassions. Patient denies any abuse of his medication. He promised he would be more careful with all his medications. I restarted him back on his clonidine and advised him to take his medication as soon as possible. Red flag symptom of elevated BP discussed with patient, he is advised to call or go to the ED if not feeling well. He verbalized understanding. Continue home BP check. F/U in 4 wks.

## 2013-04-08 NOTE — Progress Notes (Signed)
Subjective:     Patient ID: Travis [name], male   DOB: August 08, 1965, 48 y.o.   MRN: [id number]  HPI [id number]:Patient here for follow up,he stated his BP has been running high despite using his HCTZ 25 mg qd,Lisinopril 20 mg qd and metoprolol 25 mg BID, he stated he does not want to be on HCTZ since it makes him urinate a lot, he had tried it in the past he said. He stated he was doing well on Clonidine and would like to get back on it.With elevation of his BP at times he has headaches and dizziness, he feels ok now.  Current Outpatient Prescriptions on File Prior to Visit  Medication Sig Dispense Refill  . aspirin 81 MG EC tablet Take 81 mg by mouth daily.      Marland Kitchen. erythromycin ophthalmic ointment One-half inch (1.25 cm) to left eye four times daily for 5 to 7 days  3.5 g  0  . lisinopril (PRINIVIL,ZESTRIL) 20 MG tablet Take 1 tablet (20 mg total) by mouth daily.  30 tablet  0  . metoprolol tartrate (LOPRESSOR) 25 MG tablet Take 1 tablet (25 mg total) by mouth 2 (two) times daily.  60 tablet  6  . traMADol (ULTRAM) 50 MG tablet Take 2 tablets (100 mg total) by mouth every 6 (six) hours as needed.  45 tablet  0  . ranitidine (ZANTAC) 75 MG tablet Take 75 mg by mouth 2 (two) times daily as needed for heartburn.        No current facility-administered medications on file prior to visit.   Past Medical History  Diagnosis Date  . Hypertension   . Anxiety   . Tachycardia - pulse   . Degenerative disk disease   . Spinal stenosis   . Degenerative disk disease   . Tobacco abuse   . Obesity   . Polysubstance abuse   . Chest pain     Hospital, March, 2014, negative enzymes, patient refused in-hospital  stress test, patient canceled outpatient stress test       Review of Systems  Respiratory: Negative.   Cardiovascular: Negative.   Gastrointestinal: Negative.   Genitourinary: Negative.   Neurological: Negative for tremors, seizures, weakness, numbness and headaches.  All other systems reviewed and  are negative.   Filed Vitals:   04/08/13 1344  BP: 180/129  Pulse: 77  Temp: 97.6 F (36.4 C)  TempSrc: Oral  Weight: 296 lb (134.265 kg)       Objective:   Physical Exam  Nursing note and vitals reviewed. Constitutional: He is oriented to person, place, and time. He appears well-developed. No distress.  Cardiovascular: Normal rate, regular rhythm, normal heart sounds and intact distal pulses.   No murmur heard. Pulmonary/Chest: Effort normal and breath sounds normal. No respiratory distress. He has no wheezes.  Abdominal: Soft. Bowel sounds are normal. He exhibits no distension.  Musculoskeletal: Normal range of motion. He exhibits no edema.  Neurological: He is alert and oriented to person, place, and time. He has normal reflexes. No cranial nerve deficit.       Assessment:     HTN     Plan:     Check problem list

## 2013-04-08 NOTE — Patient Instructions (Signed)
Hypertension Hypertension is another name for high blood pressure. High blood pressure may mean that your heart needs to work harder to pump blood. Blood pressure consists of two numbers, which includes a higher number over a lower number (example: 110/72). HOME CARE   Make lifestyle changes as told by your doctor. This may include weight loss and exercise.  Take your blood pressure medicine every day.  Limit how much salt you use.  Stop smoking if you smoke.  Do not use drugs.  Talk to your doctor if you are using decongestants or birth control pills. These medicines might make blood pressure higher.  Females should not drink more than 1 alcoholic drink per day. Males should not drink more than 2 alcoholic drinks per day.  See your doctor as told. GET HELP RIGHT AWAY IF:   You have a blood pressure reading with a top number of 180 or higher.  You get a very bad headache.  You get blurred or changing vision.  You feel confused.  You feel weak, numb, or faint.  You get chest or belly (abdominal) pain.  You throw up (vomit).  You cannot breathe very well. MAKE SURE YOU:   Understand these instructions.  Will watch your condition.  Will get help right away if you are not doing well or get worse. Document Released: 08/07/2007 Document Revised: 05/13/2011 Document Reviewed: 08/07/2007 [employer] Patient Information 2014 [employer].  

## 2013-04-13 ENCOUNTER — Ambulatory Visit: Payer: Self-pay

## 2013-04-15 ENCOUNTER — Telehealth: Payer: Self-pay

## 2013-04-15 NOTE — Telephone Encounter (Signed)
My consultation is for injection only not med management ,needs to call sports med for medication. Schedule for L4-5 ESI with Kit

## 2013-04-15 NOTE — Telephone Encounter (Signed)
Patient c/o increased back pain that has doubled and tripled in the last couple of days. He is requesting pain medication and also an appointment.

## 2013-04-16 NOTE — Telephone Encounter (Signed)
Contacted patient to inform him that we could schedule him for a L4-5 ESI with kit and to call sports med for medication. Patient is going to call sports med first and then call back to make appt for injection if he decides to have it.  

## 2013-04-20 ENCOUNTER — Telehealth: Payer: Self-pay | Admitting: Family Medicine

## 2013-04-20 MED ORDER — CLONIDINE HCL 0.2 MG PO TABS: 0.2000 mg | ORAL_TABLET | Freq: Two times a day (BID) | ORAL | Status: DC
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
## 2013-04-20 NOTE — Telephone Encounter (Signed)
Rx sent into [facility]

## 2013-04-22 ENCOUNTER — Telehealth: Payer: Self-pay | Admitting: Family Medicine

## 2013-04-22 NOTE — Telephone Encounter (Signed)
Patient requesting a referral to a urologist. He states that Dr. Wynn BankerKirsteins told him that he needs to have a prostate checked out. He has been urinating on himself lately and it is only getting worse. I advised patient that he would need to see Dr. Lum BabeEniola about this issue before getting the referral but he stated that he just saw her last week and hopefully he could get the referral without another OV. Please advised.

## 2013-04-22 NOTE — Telephone Encounter (Signed)
Patient need diagnosis and documentation for referral. Please have him schedule f/u with me soon prior to referral to urologist. Thank you.

## 2013-05-30 ENCOUNTER — Emergency Department (HOSPITAL_COMMUNITY): Payer: Self-pay

## 2013-05-30 ENCOUNTER — Encounter (HOSPITAL_COMMUNITY): Payer: Self-pay | Admitting: Emergency Medicine

## 2013-05-30 ENCOUNTER — Emergency Department (HOSPITAL_COMMUNITY)
Admission: EM | Admit: 2013-05-30 | Discharge: 2013-05-30 | Disposition: A | Discharge status: Home / Self Care | Payer: Self-pay | Attending: Emergency Medicine | Admitting: Emergency Medicine

## 2013-05-30 DIAGNOSIS — E663 Overweight: Secondary | ICD-10-CM | POA: Insufficient documentation

## 2013-05-30 DIAGNOSIS — F17200 Nicotine dependence, unspecified, uncomplicated: Secondary | ICD-10-CM | POA: Insufficient documentation

## 2013-05-30 DIAGNOSIS — Z7982 Long term (current) use of aspirin: Secondary | ICD-10-CM | POA: Insufficient documentation

## 2013-05-30 DIAGNOSIS — Y9389 Activity, other specified: Secondary | ICD-10-CM | POA: Insufficient documentation

## 2013-05-30 DIAGNOSIS — W19XXXA Unspecified fall, initial encounter: Secondary | ICD-10-CM

## 2013-05-30 DIAGNOSIS — I10 Essential (primary) hypertension: Secondary | ICD-10-CM | POA: Insufficient documentation

## 2013-05-30 DIAGNOSIS — Z79899 Other long term (current) drug therapy: Secondary | ICD-10-CM | POA: Insufficient documentation

## 2013-05-30 DIAGNOSIS — E669 Obesity, unspecified: Secondary | ICD-10-CM | POA: Insufficient documentation

## 2013-05-30 DIAGNOSIS — E860 Dehydration: Secondary | ICD-10-CM | POA: Insufficient documentation

## 2013-05-30 DIAGNOSIS — W1809XA Striking against other object with subsequent fall, initial encounter: Secondary | ICD-10-CM | POA: Insufficient documentation

## 2013-05-30 DIAGNOSIS — S335XXA Sprain of ligaments of lumbar spine, initial encounter: Secondary | ICD-10-CM | POA: Insufficient documentation

## 2013-05-30 DIAGNOSIS — R000 Tachycardia, unspecified: Secondary | ICD-10-CM | POA: Insufficient documentation

## 2013-05-30 DIAGNOSIS — IMO0002 Reserved for concepts with insufficient information to code with codable children: Secondary | ICD-10-CM | POA: Insufficient documentation

## 2013-05-30 DIAGNOSIS — S39012A Strain of muscle, fascia and tendon of lower back, initial encounter: Secondary | ICD-10-CM

## 2013-05-30 DIAGNOSIS — F411 Generalized anxiety disorder: Secondary | ICD-10-CM | POA: Insufficient documentation

## 2013-05-30 DIAGNOSIS — Y92009 Unspecified place in unspecified non-institutional (private) residence as the place of occurrence of the external cause: Secondary | ICD-10-CM | POA: Insufficient documentation

## 2013-05-30 LAB — CBC WITH DIFFERENTIAL/PLATELET
BASOS ABS: 0 10*3/uL (ref 0.0–0.1)
BASOS PCT: 0 % (ref 0–1)
EOS ABS: 0.1 10*3/uL (ref 0.0–0.7)
EOS PCT: 1 % (ref 0–5)
HEMATOCRIT: 46 % (ref 39.0–52.0)
Hemoglobin: 15.8 g/dL (ref 13.0–17.0)
Lymphocytes Relative: 34 % (ref 12–46)
Lymphs Abs: 3 10*3/uL (ref 0.7–4.0)
MCH: 28.8 pg (ref 26.0–34.0)
MCHC: 34.3 g/dL (ref 30.0–36.0)
MCV: 83.9 fL (ref 78.0–100.0)
MONO ABS: 0.5 10*3/uL (ref 0.1–1.0)
Monocytes Relative: 6 % (ref 3–12)
Neutro Abs: 5.3 10*3/uL (ref 1.7–7.7)
Neutrophils Relative %: 59 % (ref 43–77)
Platelets: 185 10*3/uL (ref 150–400)
RBC: 5.48 MIL/uL (ref 4.22–5.81)
RDW: 13.1 % (ref 11.5–15.5)
WBC: 9 10*3/uL (ref 4.0–10.5)

## 2013-05-30 LAB — COMPREHENSIVE METABOLIC PANEL
ALBUMIN: 3.7 g/dL (ref 3.5–5.2)
ALT: 17 U/L (ref 0–53)
AST: 23 U/L (ref 0–37)
Alkaline Phosphatase: 90 U/L (ref 39–117)
BUN: 6 mg/dL (ref 6–23)
CALCIUM: 9.3 mg/dL (ref 8.4–10.5)
CO2: 27 meq/L (ref 19–32)
CREATININE: 0.88 mg/dL (ref 0.50–1.35)
Chloride: 99 mEq/L (ref 96–112)
GFR calc Af Amer: >90 mL/min (ref >90)
Glucose, Bld: 99 mg/dL (ref 70–99)
Potassium: 3.9 mEq/L (ref 3.7–5.3)
Sodium: 137 mEq/L (ref 137–147)
TOTAL PROTEIN: 7.9 g/dL (ref 6.0–8.3)
Total Bilirubin: 0.4 mg/dL (ref 0.3–1.2)

## 2013-05-30 LAB — I-STAT TROPONIN, ED: Troponin i, poc: 0.01 ng/mL (ref 0.00–0.08)

## 2013-05-30 MED ORDER — CLONIDINE HCL 0.2 MG PO TABS: 0.2000 mg | ORAL_TABLET | Freq: Two times a day (BID) | ORAL | Status: DC

## 2013-05-30 MED ORDER — METOPROLOL TARTRATE 1 MG/ML IV SOLN
5.0000 mg | Freq: Once | INTRAVENOUS | Status: DC
  Filled 2013-05-30: qty 5

## 2013-05-30 MED ORDER — CYCLOBENZAPRINE HCL 10 MG PO TABS: 10.0000 mg | ORAL_TABLET | Freq: Two times a day (BID) | ORAL | Status: DC | PRN

## 2013-05-30 MED ORDER — DIAZEPAM 5 MG/ML IJ SOLN
5.0000 mg | Freq: Once | INTRAMUSCULAR | Status: AC
  Administered 2013-05-30: 5 mg via INTRAVENOUS
  Filled 2013-05-30: qty 2

## 2013-05-30 MED ORDER — OXYCODONE-ACETAMINOPHEN 5-325 MG PO TABS: 2.0000 | ORAL_TABLET | Freq: Four times a day (QID) | ORAL | Status: DC | PRN

## 2013-05-30 MED ORDER — SODIUM CHLORIDE 0.9 % IV BOLUS (SEPSIS)
1000.0000 mL | Freq: Once | INTRAVENOUS | Status: DC
Start: 2013-05-30 — End: 2013-05-30

## 2013-05-30 MED ORDER — CLONIDINE HCL 0.1 MG PO TABS
0.2000 mg | ORAL_TABLET | Freq: Once | ORAL | Status: AC
  Administered 2013-05-30: 0.2 mg via ORAL
  Filled 2013-05-30: qty 2

## 2013-05-30 MED ORDER — ONDANSETRON HCL 4 MG/2ML IJ SOLN
4.0000 mg | Freq: Once | INTRAMUSCULAR | Status: AC
  Administered 2013-05-30: 4 mg via INTRAVENOUS
  Filled 2013-05-30: qty 2

## 2013-05-30 MED ORDER — HYDROMORPHONE HCL PF 1 MG/ML IJ SOLN
1.0000 mg | Freq: Once | INTRAMUSCULAR | Status: AC
  Administered 2013-05-30: 1 mg via INTRAVENOUS
  Filled 2013-05-30: qty 1

## 2013-05-30 MED ORDER — SODIUM CHLORIDE 0.9 % IV BOLUS (SEPSIS)
1000.0000 mL | Freq: Once | INTRAVENOUS | Status: AC
  Administered 2013-05-30: 1000 mL via INTRAVENOUS
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
## 2013-05-30 NOTE — Discharge Instructions (Signed)
Take your blood pressure medicines as prescribed.   Take percocet as prescribed for severe pain. Do NOT drive with it.   Take flexeril for muscle spasms.  Follow up with your doctor.   Return to ER if you have severe pain, worse dizziness, passing out

## 2013-05-30 NOTE — ED Notes (Signed)
Pt from home c/o pain located in middle of back. He reports that he got dizzy and tripped over the end of the couch. He states "dizziness is normal for me". Pt ambulates with a cane at home.

## 2013-06-15 ENCOUNTER — Ambulatory Visit: Payer: Self-pay | Admitting: Family Medicine

## 2013-06-27 ENCOUNTER — Emergency Department (HOSPITAL_COMMUNITY)
Admission: EM | Admit: 2013-06-27 | Discharge: 2013-06-27 | Disposition: A | Discharge status: Home / Self Care | Payer: Self-pay | Attending: Emergency Medicine | Admitting: Emergency Medicine

## 2013-06-27 ENCOUNTER — Encounter (HOSPITAL_COMMUNITY): Payer: Self-pay | Admitting: Emergency Medicine

## 2013-06-27 DIAGNOSIS — Z79899 Other long term (current) drug therapy: Secondary | ICD-10-CM | POA: Insufficient documentation

## 2013-06-27 DIAGNOSIS — E669 Obesity, unspecified: Secondary | ICD-10-CM | POA: Insufficient documentation

## 2013-06-27 DIAGNOSIS — R52 Pain, unspecified: Secondary | ICD-10-CM | POA: Insufficient documentation

## 2013-06-27 DIAGNOSIS — F17200 Nicotine dependence, unspecified, uncomplicated: Secondary | ICD-10-CM | POA: Insufficient documentation

## 2013-06-27 DIAGNOSIS — M549 Dorsalgia, unspecified: Secondary | ICD-10-CM

## 2013-06-27 DIAGNOSIS — M545 Low back pain, unspecified: Secondary | ICD-10-CM | POA: Insufficient documentation

## 2013-06-27 DIAGNOSIS — I10 Essential (primary) hypertension: Secondary | ICD-10-CM | POA: Insufficient documentation

## 2013-06-27 DIAGNOSIS — Z7982 Long term (current) use of aspirin: Secondary | ICD-10-CM | POA: Insufficient documentation

## 2013-06-27 DIAGNOSIS — F411 Generalized anxiety disorder: Secondary | ICD-10-CM | POA: Insufficient documentation

## 2013-06-27 DIAGNOSIS — G8929 Other chronic pain: Secondary | ICD-10-CM | POA: Insufficient documentation

## 2013-06-27 MED ORDER — OXYCODONE-ACETAMINOPHEN 5-325 MG PO TABS
1.0000 | ORAL_TABLET | Freq: Once | ORAL | Status: AC
  Administered 2013-06-27: 1 via ORAL
  Filled 2013-06-27: qty 1

## 2013-06-27 MED ORDER — CYCLOBENZAPRINE HCL 10 MG PO TABS: 10.0000 mg | ORAL_TABLET | Freq: Two times a day (BID) | ORAL | Status: DC | PRN

## 2013-06-27 NOTE — ED Notes (Signed)
C/o 2 days of back spasms, no sleep for 2 days.  States last tramadol  at 0800 this morning.

## 2013-06-27 NOTE — ED Notes (Signed)
Pt reports having chronic back pain that has been ongoing for 15 years, pt reports having spinal stenosis and degenerative disc disease. Pt reports being seen in late March for the same, pt states that over the past two days the pain has worsened.  Pt is A/O x4 and in NAD.

## 2013-06-27 NOTE — Discharge Instructions (Signed)

## 2013-06-27 NOTE — ED Provider Notes (Signed)
CSN: [id number]     Arrival date & time 06/27/13  1110 History   First MD Initiated Contact with Patient 06/27/13 1124     Chief Complaint  Patient presents with  . Back Pain     HPI Long-standing history of chronic back pain here with acute exacerbation of the past 24-48 hours.  No new weakness in his legs.  No fevers or chills.  No IV drug use.  He tries tramadol without improvement in his symptoms.  He's required steroid injections before in the past.  [facility].  Denies abdominal pain chest pain shortness of breath.  No recent injury or trauma.   Past Medical History  Diagnosis Date  . Hypertension   . Anxiety   . Tachycardia - pulse   . Degenerative disk disease   . Spinal stenosis   . Degenerative disk disease   . Tobacco abuse   . Obesity   . Polysubstance abuse   . Chest pain     Hospital, March, 2014, negative enzymes, patient refused in-hospital  stress test, patient canceled outpatient stress test   History reviewed. No pertinent past surgical history. Family History  Problem Relation Age of Onset  . Stroke Mother   . Hypertension Mother   . Stroke Father   . Hypertension Father   . Dementia Father   . Diabetes Father   . Heart disease Father   . Cancer Sister     brain  . Heart disease Brother    History  Substance Use Topics  . Smoking status: Light Tobacco Smoker -- 0.25 packs/day    Types: Cigarettes    Last Attempt to Quit: 06/10/2012  . Smokeless tobacco: Not on file  . Alcohol Use: No    Review of Systems  All other systems reviewed and are negative.     Allergies  Blueberry flavor; Other; Penicillins; Toradol; and Robaxin  Home Medications   Prior to Admission medications   Medication Sig Start Date End Date Taking? Authorizing Provider  aspirin 81 MG EC tablet Take 81 mg by mouth every evening.  05/30/12   Amber Nydia BoutonM Hairford, MD  cloNIDine (CATAPRES) 0.2 MG tablet Take 1 tablet (0.2 mg total) by mouth 2  (two) times daily. 04/20/13   Tommie SamsJayce G Cook, DO  cloNIDine (CATAPRES) 0.2 MG tablet Take 1 tablet (0.2 mg total) by mouth 2 (two) times daily. 05/30/13   Richardean Canalavid H Yao, MD  cyclobenzaprine (FLEXERIL) 10 MG tablet Take 1 tablet (10 mg total) by mouth 2 (two) times daily as needed for muscle spasms. 05/30/13   Richardean Canalavid H Yao, MD  lisinopril (PRINIVIL,ZESTRIL) 20 MG tablet Take 1 tablet (20 mg total) by mouth daily. 01/02/13   Latrelle DodrillBrittany J McIntyre, MD  metoprolol tartrate (LOPRESSOR) 25 MG tablet Take 1 tablet (25 mg total) by mouth 2 (two) times daily. 03/18/13   Janit PaganKehinde Eniola, MD  oxyCODONE-acetaminophen (PERCOCET) 5-325 MG per tablet Take 2 tablets by mouth every 6 (six) hours as needed. 05/30/13   Richardean Canalavid H Yao, MD  ranitidine (ZANTAC) 75 MG tablet Take 75 mg by mouth 2 (two) times daily as needed for heartburn.     Historical Provider, MD  traMADol (ULTRAM) 50 MG tablet Take 2 tablets (100 mg total) by mouth every 6 (six) hours as needed. 04/02/13   Shelva MajesticStephen O Hunter, MD   BP 160/104  Pulse 80  Temp(Src) 98.6 F (37 C) (Oral)  Resp 20  SpO2 98% Physical Exam  Nursing note and vitals reviewed. Constitutional: He is oriented to person, place, and time. He appears well-developed and well-nourished.  HENT:  Head: Normocephalic and atraumatic.  Eyes: EOM are normal.  Neck: Normal range of motion.  Cardiovascular: Normal rate, regular rhythm, normal heart sounds and intact distal pulses.   Pulmonary/Chest: Effort normal and breath sounds normal. No respiratory distress.  Abdominal: Soft. He exhibits no distension. There is no tenderness.  Musculoskeletal: Normal range of motion.  No thoracic or lumbar tenderness.  Mild paralumbar tenderness without spasm  Neurological: He is alert and oriented to person, place, and time.  Skin: Skin is warm and dry.  Psychiatric: He has a normal mood and affect. Judgment normal.    ED Course  Procedures (including critical care time) Labs Review Labs Reviewed - No  data to display  Imaging Review No results found.   EKG Interpretation None      MDM   Final diagnoses:  Chronic back pain    Acute on chronic back pain.  No back pain red flags to suggest need for emergent imaging today.  Normal strength in lower extremities.  Discharge home with PCP followup.    Lyanne CoKevin M Jaymar Loeber, MD 06/27/13 [phone]

## 2013-06-28 ENCOUNTER — Ambulatory Visit: Payer: No Typology Code available for payment source

## 2013-07-11 ENCOUNTER — Telehealth: Payer: Self-pay | Admitting: Family Medicine

## 2013-07-11 MED ORDER — CLONIDINE HCL 0.2 MG PO TABS: 0.2000 mg | ORAL_TABLET | Freq: Two times a day (BID) | ORAL | Status: DC

## 2013-07-11 NOTE — Telephone Encounter (Signed)
After Hours Emergency Line:  Patient called stating his Clonidine ran out yesterday morning. Has not taken to today and his blood pressure has rebounded to 178/120. He denies HA, CP, SOB or LE edema.  On review of medications, it appears his last Rx was only for 30 pills given in the ED in March. He states he has been on medication since then from a different refill.  Since his BP is so elevated,I feel it is best to give him a one month refill on his medication to prevent bad outcomes.  Clonidine 0.2mg  take po BID, #60/0R sent to [facility]. Patient will call Monday for follow up appointment with his PCP.  Michale Emmerich M. Lasonia Casino, M.D.

## 2013-07-23 ENCOUNTER — Telehealth: Payer: Self-pay | Admitting: Family Medicine

## 2013-07-23 MED ORDER — CLONIDINE HCL 0.2 MG PO TABS
0.2000 mg | ORAL_TABLET | Freq: Two times a day (BID) | ORAL | Status: DC
Start: 2013-07-23 — End: 2013-07-27
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
## 2013-07-27 ENCOUNTER — Encounter: Payer: Self-pay | Admitting: Family Medicine

## 2013-07-27 ENCOUNTER — Ambulatory Visit (INDEPENDENT_AMBULATORY_CARE_PROVIDER_SITE_OTHER): Payer: No Typology Code available for payment source | Admitting: Family Medicine

## 2013-07-27 ENCOUNTER — Ambulatory Visit (HOSPITAL_COMMUNITY)
Admission: RE | Admit: 2013-07-27 | Discharge: 2013-07-27 | Disposition: A | Discharge status: Home / Self Care | Payer: No Typology Code available for payment source | Source: Ambulatory Visit | Attending: Family Medicine | Admitting: Family Medicine

## 2013-07-27 VITALS — BP 144/110 | HR 144 | Temp 98.4°F | Wt 297.8 lb

## 2013-07-27 DIAGNOSIS — K625 Hemorrhage of anus and rectum: Secondary | ICD-10-CM

## 2013-07-27 DIAGNOSIS — M5416 Radiculopathy, lumbar region: Secondary | ICD-10-CM

## 2013-07-27 DIAGNOSIS — IMO0002 Reserved for concepts with insufficient information to code with codable children: Secondary | ICD-10-CM

## 2013-07-27 DIAGNOSIS — R000 Tachycardia, unspecified: Secondary | ICD-10-CM | POA: Insufficient documentation

## 2013-07-27 DIAGNOSIS — R32 Unspecified urinary incontinence: Secondary | ICD-10-CM

## 2013-07-27 DIAGNOSIS — I10 Essential (primary) hypertension: Secondary | ICD-10-CM

## 2013-07-27 LAB — CBC
HEMATOCRIT: 44.1 % (ref 39.0–52.0)
HEMOGLOBIN: 15.5 g/dL (ref 13.0–17.0)
MCH: 29 pg (ref 26.0–34.0)
MCHC: 35.1 g/dL (ref 30.0–36.0)
MCV: 82.6 fL (ref 78.0–100.0)
Platelets: 192 10*3/uL (ref 150–400)
RBC: 5.34 MIL/uL (ref 4.22–5.81)
RDW: 13.4 % (ref 11.5–15.5)
WBC: 8.7 10*3/uL (ref 4.0–10.5)

## 2013-07-27 MED ORDER — METOPROLOL TARTRATE 50 MG PO TABS: 50.0000 mg | ORAL_TABLET | Freq: Two times a day (BID) | ORAL | Status: DC

## 2013-07-27 MED ORDER — TRAMADOL HCL 50 MG PO TABS
100.0000 mg | ORAL_TABLET | Freq: Four times a day (QID) | ORAL | Status: DC | PRN
Start: 2013-07-27 — End: 2013-08-16

## 2013-07-27 MED ORDER — LISINOPRIL 20 MG PO TABS: 20.0000 mg | ORAL_TABLET | Freq: Every day | ORAL | Status: DC

## 2013-07-27 MED ORDER — CLONIDINE HCL 0.2 MG PO TABS: 0.2000 mg | ORAL_TABLET | Freq: Two times a day (BID) | ORAL | Status: DC

## 2013-07-27 NOTE — Patient Instructions (Addendum)
Your blood pressure and your heart rate are up today despite taking your medication. I will like you to go up on your Metoprolol to 50 mg BID, continue to check your BP at home and return to clinic tomorrow or Friday to check your BP. For your rectal bleeding, I will check if you can get referral to the gastroenterologist with your insurance otherwise if having any symptoms please go to the ER. I will like to see you back in 1wk.  Hypertension Hypertension is another name for high blood pressure. High blood pressure may mean that your heart needs to work harder to pump blood. Blood pressure consists of two numbers, which includes a higher number over a lower number (example: 110/72). HOME CARE   Make lifestyle changes as told by your doctor. This may include weight loss and exercise.  Take your blood pressure medicine every day.  Limit how much salt you use.  Stop smoking if you smoke.  Do not use drugs.  Talk to your doctor if you are using decongestants or birth control pills. These medicines might make blood pressure higher.  Females should not drink more than 1 alcoholic drink per day. Males should not drink more than 2 alcoholic drinks per day.  See your doctor as told. GET HELP RIGHT AWAY IF:   You have a blood pressure reading with a top number of 180 or higher.  You get a very bad headache.  You get blurred or changing vision.  You feel confused.  You feel weak, numb, or faint.  You get chest or belly (abdominal) pain.  You throw up (vomit).  You cannot breathe very well. MAKE SURE YOU:   Understand these instructions.  Will watch your condition.  Will get help right away if you are not doing well or get worse. Document Released: 08/07/2007 Document Revised: 05/13/2011 Document Reviewed: 08/07/2007 [facility] Patient Information 2014 [place], [place].

## 2013-07-27 NOTE — Assessment & Plan Note (Signed)
Tramadol refilled.
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
## 2013-07-27 NOTE — Assessment & Plan Note (Addendum)
Etiology unclear. Asymptomatic. Patient mentioned he has had issues with this in the past. EKG done today: NSR @ 124 bpm, T wave inversion on lead II with Q wave,non specific ST changes. EKG similar to that done in march 2015. TSH, Utox, CBC,BMET checked today. Plan to increase Metoprolol to 50 mg BID. F/U in 1-2 for HR check. F/U with me in 1 wk. Return precuation given. Return to clinic or ED if having chest pain or palpitations.
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
## 2013-07-27 NOTE — Assessment & Plan Note (Signed)
??   BPH. He declined rectal exam. I doubt if urologist will see with orange card. Referral order placed.

## 2013-07-27 NOTE — Assessment & Plan Note (Signed)
Currently asymptomatic. ?? Hemorrhoids. Patient declined rectal bleed. Instruction given to go straight to the ED if having symptoms again. CBC checked today to assess for anemia suggestive of acute blood loss. Patient agreed with plan.

## 2013-07-28 ENCOUNTER — Telehealth: Payer: Self-pay | Admitting: Family Medicine

## 2013-07-28 LAB — BASIC METABOLIC PANEL
BUN: 8 mg/dL (ref 6–23)
CALCIUM: 9 mg/dL (ref 8.4–10.5)
CO2: 28 mEq/L (ref 19–32)
Chloride: 99 mEq/L (ref 96–112)
Creat: 0.89 mg/dL (ref 0.50–1.35)
GLUCOSE: 92 mg/dL (ref 70–99)
Potassium: 3.8 mEq/L (ref 3.5–5.3)
Sodium: 135 mEq/L (ref 135–145)

## 2013-07-28 LAB — DRUG SCREEN URINE W/ALC, PAIN MGMT, REFLEX
AMPHETAMINE SCRN UR: NEGATIVE
BARBITURATE QUANT UR: NEGATIVE
COCAINE METABOLITES: NEGATIVE
CREATININE, U: 268.56 mg/dL
Marijuana Metabolite: NEGATIVE
Methadone: NEGATIVE
OPIATES: NEGATIVE
Phencyclidine (PCP): NEGATIVE
Propoxyphene: NEGATIVE

## 2013-07-28 LAB — TSH: TSH: 0.946 u[IU]/mL (ref 0.350–4.500)

## 2013-07-28 NOTE — Telephone Encounter (Signed)
Blood presurre 120/83 heartrate is 64 willl come by tomorrow to have them checke Pt says thank you to dr Lum Travis [name]

## 2013-07-28 NOTE — Telephone Encounter (Signed)
Thanks, that's a good news.

## 2013-07-30 ENCOUNTER — Telehealth: Payer: Self-pay | Admitting: Family Medicine

## 2013-07-30 LAB — BENZODIAZEPINES (GC/LC/MS), URINE
Alprazolam metabolite (GC/LC/MS), ur confirm: 788 ng/mL — ABNORMAL HIGH (ref <25)
Clonazepam metabolite (GC/LC/MS), ur confirm: 34 ng/mL — ABNORMAL HIGH (ref <25)
Flurazepam metabolite (GC/LC/MS), ur confirm: NEGATIVE ng/mL (ref <50)
Lorazepam (GC/LC/MS), ur confirm: 154 ng/mL — ABNORMAL HIGH (ref <50)
MIDAZOLAMU: NEGATIVE ng/mL (ref <50)
Nordiazepam (GC/LC/MS), ur confirm: NEGATIVE ng/mL (ref <50)
OXAZEPAMU: NEGATIVE ng/mL (ref <50)
Temazepam (GC/LC/MS), ur confirm: NEGATIVE ng/mL (ref <50)
Triazolam metabolite (GC/LC/MS), ur confirm: NEGATIVE ng/mL (ref <50)

## 2013-07-30 NOTE — Telephone Encounter (Signed)
I called and discussed his urine toxicology report with his, he has positive benzos, he stated he got this from his last visit to the ED, he still have some at home which he uses, i reviewed his record, last ED visit was March 29th and he was given valium. As discussed with his I will check his record on [employer] website and discuss with his at his subsequent visit. I might need to stop refilling his Tramadol.

## 2013-08-10 ENCOUNTER — Emergency Department (HOSPITAL_COMMUNITY)
Admission: EM | Admit: 2013-08-10 | Discharge: 2013-08-10 | Disposition: A | Discharge status: Home / Self Care | Payer: No Typology Code available for payment source | Source: Home / Self Care

## 2013-08-10 ENCOUNTER — Ambulatory Visit (INDEPENDENT_AMBULATORY_CARE_PROVIDER_SITE_OTHER): Payer: No Typology Code available for payment source | Admitting: Family Medicine

## 2013-08-10 VITALS — BP 140/98 | HR 80 | Temp 99.0°F | Ht 71.0 in | Wt 290.0 lb

## 2013-08-10 DIAGNOSIS — K625 Hemorrhage of anus and rectum: Secondary | ICD-10-CM

## 2013-08-10 DIAGNOSIS — R1032 Left lower quadrant pain: Secondary | ICD-10-CM

## 2013-08-10 DIAGNOSIS — R32 Unspecified urinary incontinence: Secondary | ICD-10-CM

## 2013-08-10 DIAGNOSIS — R300 Dysuria: Secondary | ICD-10-CM

## 2013-08-10 DIAGNOSIS — K6289 Other specified diseases of anus and rectum: Secondary | ICD-10-CM

## 2013-08-10 DIAGNOSIS — R319 Hematuria, unspecified: Secondary | ICD-10-CM

## 2013-08-10 LAB — POCT UA - MICROSCOPIC ONLY

## 2013-08-10 LAB — POCT URINALYSIS DIPSTICK
Blood, UA: NEGATIVE
GLUCOSE UA: NEGATIVE
LEUKOCYTES UA: NEGATIVE
NITRITE UA: NEGATIVE
Protein, UA: 100
Spec Grav, UA: 1.025
UROBILINOGEN UA: 1
pH, UA: 7

## 2013-08-10 MED ORDER — HYDROCORTISONE ACETATE 25 MG RE SUPP: 25.0000 mg | Freq: Two times a day (BID) | RECTAL | Status: DC

## 2013-08-10 MED ORDER — HYDROCODONE-ACETAMINOPHEN 5-325 MG PO TABS: 1.0000 | ORAL_TABLET | Freq: Four times a day (QID) | ORAL | Status: DC | PRN

## 2013-08-10 NOTE — Patient Instructions (Signed)
Please follow up in 1 week with [name].   It is very important to be seen by the GI doctor. If that doesn't work, call [facility] or [facility].   Use the anusol twice daily   Hemorrhoids Hemorrhoids are swollen veins around the rectum or anus. There are two types of hemorrhoids:   Internal hemorrhoids. These occur in the veins just inside the rectum. They may poke through to the outside and become irritated and painful.  External hemorrhoids. These occur in the veins outside the anus and can be felt as a painful swelling or hard lump near the anus. CAUSES  Pregnancy.   Obesity.   Constipation or diarrhea.   Straining to have a bowel movement.   Sitting for long periods on the toilet.  Heavy lifting or other activity that caused you to strain.  Anal intercourse. SYMPTOMS   Pain.   Anal itching or irritation.   Rectal bleeding.   Fecal leakage.   Anal swelling.   One or more lumps around the anus.  DIAGNOSIS  Your caregiver may be able to diagnose hemorrhoids by visual examination. Other examinations or tests that may be performed include:   Examination of the rectal area with a gloved hand (digital rectal exam).   Examination of anal canal using a small tube (scope).   A blood test if you have lost a significant amount of blood.  A test to look inside the colon (sigmoidoscopy or colonoscopy). TREATMENT Most hemorrhoids can be treated at home. However, if symptoms do not seem to be getting better or if you have a lot of rectal bleeding, your caregiver may perform a procedure to help make the hemorrhoids get smaller or remove them completely. Possible treatments include:   Placing a rubber band at the base of the hemorrhoid to cut off the circulation (rubber band ligation).   Injecting a chemical to shrink the hemorrhoid (sclerotherapy).   Using a tool to burn the hemorrhoid (infrared light therapy).   Surgically removing the hemorrhoid  (hemorrhoidectomy).   Stapling the hemorrhoid to block blood flow to the tissue (hemorrhoid stapling).  HOME CARE INSTRUCTIONS   Eat foods with fiber, such as whole grains, beans, nuts, fruits, and vegetables. Ask your doctor about taking products with added fiber in them (fibersupplements).  Increase fluid intake. Drink enough water and fluids to keep your urine clear or pale yellow.   Exercise regularly.   Go to the bathroom when you have the urge to have a bowel movement. Do not wait.   Avoid straining to have bowel movements.   Keep the anal area dry and clean. Use wet toilet paper or moist towelettes after a bowel movement.   Medicated creams and suppositories may be used or applied as directed.   Only take over-the-counter or prescription medicines as directed by your caregiver.   Take warm sitz baths for 15 20 minutes, 3 4 times a day to ease pain and discomfort.   Place ice packs on the hemorrhoids if they are tender and swollen. Using ice packs between sitz baths may be helpful.   Put ice in a plastic bag.   Place a towel between your skin and the bag.   Leave the ice on for 15 20 minutes, 3 4 times a day.   Do not use a donut-shaped pillow or sit on the toilet for long periods. This increases blood pooling and pain.  SEEK MEDICAL CARE IF:  You have increasing pain and swelling that  is not controlled by treatment or medicine.  You have uncontrolled bleeding.  You have difficulty or you are unable to have a bowel movement.  You have pain or inflammation outside the area of the hemorrhoids. MAKE SURE YOU:  Understand these instructions.  Will watch your condition.  Will get help right away if you are not doing well or get worse. Document Released: 02/16/2000 Document Revised: 02/05/2012 Document Reviewed: 12/24/2011 [facility] Patient Information 2014 [employer].

## 2013-08-10 NOTE — Assessment & Plan Note (Addendum)
Also associated with left lower quadrant pain. Rectal exam difficult to visualize.  - empirically treat with anusol - gave him 5 tablets of vicodin keeping in mind that this is only temporary since he has pain contract for tramadol with [name].  - ordered CBC to evaluate for anemia and possible elevated WBC but patient left without getting blood drawn - CT abdomen and pelvis - GI referral for colonoscopy.

## 2013-08-10 NOTE — Progress Notes (Signed)
I reviewed and discussed with Dr Gwenlyn Saran

## 2013-08-10 NOTE — ED Notes (Signed)
Called name and patient did not answer.   Informed front office if the patient becomes present to notify back crew

## 2013-08-10 NOTE — Progress Notes (Signed)
Patient ID: Travis [name]    DOB: 1965/05/15, 48 y.o.   MRN: [id number] --- Subjective:  Travis [name] is a 48 y.o.male with h/o chronic back pain, anxiety, HTN who presents with rectal pain and abdominal pain.  Rectal pain started Sunday. Worst with bowel movements, sitting and worst at night. Radiates to the perineum. crampy pain that occurs in bursts but never completely resolves. It has been associated with rectal bleeding. He first had rectal bleeding 2 months ago when he felt a pop in the rectal area. He then had 1-2 episodes of bloody stool and more recently has had 2 more bloody bowel movements, one on Friday and one last night. Blood on the stool and in the toilet bowl. No constipation. No straining. No tearing pain with bowel movement. Patient has a soft bowel movement daily. Takes stool softeners for this. Some chills. No documented fever. No anal intercourse. He had an episode of non bilious,. Non bloody emesis over the weekend but has otherwise been keeping fluids down.  Has never had a colonoscopy. Family history significant for grandfather with colon CA at 39yo.  He continues to have urinary incontinence which he has had for the last 1 year. He has not been able to see urology due to insurance concerns. He denies any dysuria or hematuria. He reports that he has been able to void and voids completely.   ROS: see HPI Past Medical History: reviewed and updated medications and allergies. Social History: Tobacco: 1/4 pack per day  Objective: Filed Vitals:   08/10/13 1026  BP: 140/98  Pulse: 80  Temp: 99 F (37.2 C)    Physical Examination:   General appearance - alert, in no acute distress Abdomen - soft, non distended, tender to palpation in left, right lower quadrants and suprapubic region, worst in the left lower quadrant, no rebound, no guarding Rectal exam: no external hemorrhoid but anal tag present, digital exam difficult due to discomfort and tightening of the sphincter muscles.  Prostate unable to assess. Unable to obtain stool for guaiac.  Anoscope: inserted 1-2 cm in. Inflamed internal hemorrhoidal tissue identified.    Results for BANKS, QU (MRN [id number]) as of 08/10/2013 11:44  Ref. Range 08/10/2013 10:33  Color, UA No range found AMBER  Specific Gravity, UA No range found 1.025  pH, UA No range found 7.0  Glucose Latest Range: NEGATIVE mg/dL NEG  Bilirubin, UA No range found SMALL  Ketones, UA No range found TRACE  Clarity, UA No range found CLEAR  Protein, UA No range found 100  Urobilinogen, UA Latest Range: 0.0-1.0 mg/dL 1.0  Nitrite, UA No range found NEG  Leukocytes, UA No range found Negative  RBC, UA No range found NEG

## 2013-08-10 NOTE — Assessment & Plan Note (Addendum)
Associated with rectal pain. Could be hemorrhoidal but given patient's age and given associated pain, will refer him to GI for colonoscopy.  Unfortunately, patient only has orange card and coverage may be a problem. Patient was given list of GI doctors to call. If unable to be seen, he will need to call [facility] or [facility]. - additionally, given left lower quadrant pain and rectal bleeding, cannot exclude diverticulitis. Will get CT abdomen for evaluation. Had initially scheduled it for today but patient refused secondary to not having a ride. Rescheduled for tomorrow.  - patient to return to care if worsening bleeding or pain  - Patient to follow up in 1 week with [name].

## 2013-08-10 NOTE — Assessment & Plan Note (Signed)
Unchanged since previous. Long standing problem. UA unremarkable today

## 2013-08-11 ENCOUNTER — Other Ambulatory Visit: Payer: No Typology Code available for payment source

## 2013-08-11 ENCOUNTER — Telehealth: Payer: Self-pay | Admitting: Family Medicine

## 2013-08-11 ENCOUNTER — Telehealth: Payer: Self-pay | Admitting: *Deleted

## 2013-08-11 DIAGNOSIS — K625 Hemorrhage of anus and rectum: Secondary | ICD-10-CM

## 2013-08-11 DIAGNOSIS — R1032 Left lower quadrant pain: Secondary | ICD-10-CM

## 2013-08-11 LAB — CBC WITH DIFFERENTIAL/PLATELET
BASOS PCT: 1 % (ref 0–1)
Basophils Absolute: 0.1 10*3/uL (ref 0.0–0.1)
EOS PCT: 1 % (ref 0–5)
Eosinophils Absolute: 0.1 10*3/uL (ref 0.0–0.7)
HEMATOCRIT: 44 % (ref 39.0–52.0)
Hemoglobin: 15 g/dL (ref 13.0–17.0)
Lymphocytes Relative: 42 % (ref 12–46)
Lymphs Abs: 3.3 10*3/uL (ref 0.7–4.0)
MCH: 28.5 pg (ref 26.0–34.0)
MCHC: 34.1 g/dL (ref 30.0–36.0)
MCV: 83.5 fL (ref 78.0–100.0)
MONO ABS: 0.6 10*3/uL (ref 0.1–1.0)
MONOS PCT: 7 % (ref 3–12)
NEUTROS ABS: 3.9 10*3/uL (ref 1.7–7.7)
Neutrophils Relative %: 49 % (ref 43–77)
Platelets: 184 10*3/uL (ref 150–400)
RBC: 5.27 MIL/uL (ref 4.22–5.81)
RDW: 13.6 % (ref 11.5–15.5)
WBC: 7.9 10*3/uL (ref 4.0–10.5)

## 2013-08-11 MED ORDER — HYDROCORTISONE ACETATE 25 MG RE SUPP: 25.0000 mg | Freq: Two times a day (BID) | RECTAL | Status: DC

## 2013-08-11 NOTE — Telephone Encounter (Signed)
Will forward to MD. Kingstyn Deruiter,CMA  

## 2013-08-11 NOTE — Telephone Encounter (Signed)
Ordered new CT abdomen and pelvis with contrast.  Was unable to discontinue previous order.   Travis [name], PGY-3 Family Medicine Resident

## 2013-08-11 NOTE — Telephone Encounter (Signed)
Will forward to Dr. Gwenlyn Saran who saw patient. Jazmin Hartsell,CMA

## 2013-08-11 NOTE — Telephone Encounter (Signed)
LMOVM for pt to return call.  Please inform of the below. Fleeger, Jessica Dawn  

## 2013-08-11 NOTE — Telephone Encounter (Signed)
Please let patient know that there are no good cheaper alternatives, but I have printed out for him coupons for [facility] where it would be 15$ with the coupon or [facility] where it would be 36$. He can pick these up at the front desk.  Thank you!  Marena Chancy, PGY-3 Family Medicine Resident

## 2013-08-11 NOTE — Telephone Encounter (Signed)
anusol prescription is $200. Would like something cheaper Has lab appt at 2 today Please advise

## 2013-08-11 NOTE — Telephone Encounter (Signed)
Pt has an appt for CT scan on 08/12/2013 at 10 AM.  Order for CT of Abd/pelvic with and without contrast ordered.  Per Selena Batten from radiology department this type of scan is done with contract only.   Please call department at ext [phone] to clarify order.  Clovis Pu, RN

## 2013-08-11 NOTE — Progress Notes (Signed)
CBC WITH DIFF DONE TODAY Travis [name] 

## 2013-08-12 ENCOUNTER — Telehealth: Payer: Self-pay | Admitting: Family Medicine

## 2013-08-12 ENCOUNTER — Ambulatory Visit (HOSPITAL_COMMUNITY)
Admission: RE | Admit: 2013-08-12 | Discharge: 2013-08-12 | Disposition: A | Discharge status: Home / Self Care | Payer: No Typology Code available for payment source | Source: Ambulatory Visit | Attending: Family Medicine | Admitting: Family Medicine

## 2013-08-12 ENCOUNTER — Other Ambulatory Visit (HOSPITAL_COMMUNITY): Payer: Self-pay

## 2013-08-12 ENCOUNTER — Ambulatory Visit (HOSPITAL_COMMUNITY): Payer: No Typology Code available for payment source

## 2013-08-12 DIAGNOSIS — R1032 Left lower quadrant pain: Secondary | ICD-10-CM

## 2013-08-12 DIAGNOSIS — K6289 Other specified diseases of anus and rectum: Secondary | ICD-10-CM | POA: Insufficient documentation

## 2013-08-12 DIAGNOSIS — K921 Melena: Secondary | ICD-10-CM | POA: Insufficient documentation

## 2013-08-12 MED ORDER — IOHEXOL 300 MG/ML  SOLN
100.0000 mL | Freq: Once | INTRAMUSCULAR | Status: AC | PRN
  Administered 2013-08-12: 100 mL via INTRAVENOUS

## 2013-08-12 NOTE — Telephone Encounter (Signed)
Please let patient know that the CT abdomen did not show anything to explain his pain. His blood work is normal.   Marena Chancy, PGY-3 Family Medicine Resident

## 2013-08-13 ENCOUNTER — Telehealth: Payer: Self-pay | Admitting: Family Medicine

## 2013-08-13 NOTE — Telephone Encounter (Signed)
Reviewed patient's chart. Last Tramadol prescription was on 07/28/14. Given 200 tablets. If he took 8 tablets per day (prescribed 2 tablets every 6 hours) he would have used 144 tablets from the last refill. This means that he would have 56 tablets left. He should have more than enough Tramadol to last him through the weekend. If he is having increased pain or taking more tablets than prescribed he will need an office visit.   Note to nursing staff - please call patient and offer him and appointment to discuss his pain, I can not refill his pain medications as he should have more than enough to cover him at this time (please see above). As I am not his PCP and have never seen him in office I can not send in a new narcotic without an office visit and evaluation.

## 2013-08-13 NOTE — Telephone Encounter (Signed)
Will forward to MD that is covering for Dr. Lum BabeEniola. Brittan Mapel,CMA

## 2013-08-13 NOTE — Telephone Encounter (Signed)
Called patient and read message to him from Dr Gwenlyn SaranLosq, also told him to schedule a follow up appointment with Dr Lum BabeEniola as instructed at his last office visit.Lee-Anne Flicker, Rodena Medinobert Lee

## 2013-08-13 NOTE — Telephone Encounter (Signed)
Patient states the Dr. Lum BabeEniola "shorted" him 40 tabs of Tramadol on his RX in May. He usually received 240 tab but only received 200 tab. Patient states he is still in a lot of pain since his visit on 08/10/13 with Losq and requesting additional meds because he will be out of his Tramadol by this weekend. Please advise.

## 2013-08-16 ENCOUNTER — Encounter: Payer: Self-pay | Admitting: Family Medicine

## 2013-08-16 ENCOUNTER — Ambulatory Visit (INDEPENDENT_AMBULATORY_CARE_PROVIDER_SITE_OTHER): Payer: No Typology Code available for payment source | Admitting: Family Medicine

## 2013-08-16 VITALS — BP 133/94 | HR 80 | Temp 98.7°F | Ht 71.5 in | Wt 293.6 lb

## 2013-08-16 DIAGNOSIS — R32 Unspecified urinary incontinence: Secondary | ICD-10-CM

## 2013-08-16 DIAGNOSIS — K625 Hemorrhage of anus and rectum: Secondary | ICD-10-CM

## 2013-08-16 LAB — POCT UA - MICROSCOPIC ONLY

## 2013-08-16 LAB — POCT URINALYSIS DIPSTICK
Blood, UA: NEGATIVE
Glucose, UA: NEGATIVE
KETONES UA: NEGATIVE
Leukocytes, UA: NEGATIVE
Nitrite, UA: NEGATIVE
PROTEIN UA: 30
Urobilinogen, UA: 1
pH, UA: 6

## 2013-08-16 MED ORDER — DILTIAZEM HCL POWD: Status: DC

## 2013-08-16 MED ORDER — TRAMADOL HCL 50 MG PO TABS: 100.0000 mg | ORAL_TABLET | Freq: Four times a day (QID) | ORAL | Status: DC | PRN

## 2013-08-16 NOTE — Patient Instructions (Signed)
Mr. Travis [name],  Thank you for coming in today. I am sorry you are not feeling well.  1. Diltiazem cream for anus to relax sphincter 2. Tramadol clarified with Dr. Lum BabeEniola, missing 40 pills prescribed.   Dr. Armen PickupFunches

## 2013-08-16 NOTE — Progress Notes (Signed)
   Subjective:    Patient ID: Irene LimboJames Gouge, male    DOB: [birth year], 48 y.o.   MRN: [id number] CC: rectal bleeding  HPI 48 yo M presents for SD visit:  1. Painful rectal bleeding: recurrent. Evaluated last week. Exam and history concerning for fissure. Patient using anusol with some relief of pain. Had a bleeding episode today. Not bleeding currently. Had a normal CT abdomen. Denies diarrhea, constipation and fever.   2. Tramadol:  Was given rx for 200 pills, usually gets 240 pills. Ask if I could clarify with his PCP. I spoke to his PCP, 200 was an error. His PCP ok'd an  Rx for the missing 40 pills.   Soc hx: current smoker  Review of Systems As per HPI     Objective:   Physical Exam BP 133/94  Pulse 80  Temp(Src) 98.7 F (37.1 C) (Oral)  Ht 5' 11.5" (1.816 m)  Wt 293 lb 9.6 oz (133.176 kg)  BMI 40.38 kg/m2 General appearance: alert, cooperative and no distress Lungs: normal WOB Rectal: deferred given recent evaluation and patient request.     Assessment & Plan:

## 2013-08-16 NOTE — Assessment & Plan Note (Signed)
History consistent with fissure. Plan: continue anusol Sitz baths rx for diltiazem cream 2% 4 x times daily recommended patient take rx to [facility].

## 2013-08-23 ENCOUNTER — Telehealth: Payer: Self-pay | Admitting: Family Medicine

## 2013-08-23 MED ORDER — TRAMADOL HCL 50 MG PO TABS: 100.0000 mg | ORAL_TABLET | Freq: Four times a day (QID) | ORAL | Status: DC | PRN

## 2013-08-23 NOTE — Telephone Encounter (Signed)
Tramadol refilled. I called it in to his pharmacy today.

## 2013-08-23 NOTE — Telephone Encounter (Signed)
Pt called and would like a refill on his tramadol left up front for pick up. jw

## 2013-08-23 NOTE — Telephone Encounter (Signed)
Refill done by phone in, please let patient know to pick up medication.

## 2013-08-28 ENCOUNTER — Telehealth: Payer: Self-pay | Admitting: Family Medicine

## 2013-08-28 MED ORDER — CLONIDINE HCL 0.2 MG PO TABS: 0.2000 mg | ORAL_TABLET | Freq: Two times a day (BID) | ORAL | Status: DC

## 2013-08-28 NOTE — Telephone Encounter (Signed)
Patient calling reporting increased BP, stating that he is out of his Clonidine. I refilled his Clonidine and advised him to go to the ED if BP is uncontrolled (and he develops other concerning symptoms: SOB, headache, etc). Patient in agreement.

## 2013-09-06 ENCOUNTER — Telehealth: Payer: Self-pay | Admitting: Family Medicine

## 2013-09-06 ENCOUNTER — Encounter: Payer: Self-pay | Admitting: *Deleted

## 2013-09-06 MED ORDER — CLONIDINE HCL 0.2 MG PO TABS: 0.2000 mg | ORAL_TABLET | Freq: Two times a day (BID) | ORAL | Status: DC
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
## 2013-09-06 NOTE — Telephone Encounter (Signed)
Pt called back to after hours line and states script is not at pharmacy. I personally called prescription into pharmacy and spoke with a pharmacist. He Seems to have a a history of "misplacing" his clonidine and is red flagged in their system. For this reason, when I verbally called in new prescription I only called #30 quantity and told him to go to ED, urgent care or clinic for evaluation.  Felix PaciniKuneff, Kassiah Mccrory DO PGY-3

## 2013-09-06 NOTE — Telephone Encounter (Signed)
Thanks Renee, I appreciate your help.

## 2013-09-12 ENCOUNTER — Emergency Department (HOSPITAL_COMMUNITY)
Admission: EM | Admit: 2013-09-12 | Discharge: 2013-09-12 | Disposition: A | Discharge status: Home / Self Care | Payer: No Typology Code available for payment source | Attending: Emergency Medicine | Admitting: Emergency Medicine

## 2013-09-12 ENCOUNTER — Encounter (HOSPITAL_COMMUNITY): Payer: Self-pay | Admitting: Emergency Medicine

## 2013-09-12 DIAGNOSIS — I10 Essential (primary) hypertension: Secondary | ICD-10-CM | POA: Insufficient documentation

## 2013-09-12 DIAGNOSIS — F411 Generalized anxiety disorder: Secondary | ICD-10-CM | POA: Insufficient documentation

## 2013-09-12 DIAGNOSIS — Z7982 Long term (current) use of aspirin: Secondary | ICD-10-CM | POA: Insufficient documentation

## 2013-09-12 DIAGNOSIS — IMO0002 Reserved for concepts with insufficient information to code with codable children: Secondary | ICD-10-CM | POA: Insufficient documentation

## 2013-09-12 DIAGNOSIS — E669 Obesity, unspecified: Secondary | ICD-10-CM | POA: Insufficient documentation

## 2013-09-12 DIAGNOSIS — M4800 Spinal stenosis, site unspecified: Secondary | ICD-10-CM | POA: Insufficient documentation

## 2013-09-12 DIAGNOSIS — F410 Panic disorder [episodic paroxysmal anxiety] without agoraphobia: Secondary | ICD-10-CM

## 2013-09-12 DIAGNOSIS — K625 Hemorrhage of anus and rectum: Secondary | ICD-10-CM | POA: Insufficient documentation

## 2013-09-12 DIAGNOSIS — R000 Tachycardia, unspecified: Secondary | ICD-10-CM | POA: Insufficient documentation

## 2013-09-12 DIAGNOSIS — F17200 Nicotine dependence, unspecified, uncomplicated: Secondary | ICD-10-CM | POA: Insufficient documentation

## 2013-09-12 DIAGNOSIS — Z79899 Other long term (current) drug therapy: Secondary | ICD-10-CM | POA: Insufficient documentation

## 2013-09-12 DIAGNOSIS — Z880 Allergy status to penicillin: Secondary | ICD-10-CM | POA: Insufficient documentation

## 2013-09-12 LAB — CBC
HEMATOCRIT: 48.3 % (ref 39.0–52.0)
Hemoglobin: 16.4 g/dL (ref 13.0–17.0)
MCH: 28.9 pg (ref 26.0–34.0)
MCHC: 34 g/dL (ref 30.0–36.0)
MCV: 85 fL (ref 78.0–100.0)
Platelets: 206 10*3/uL (ref 150–400)
RBC: 5.68 MIL/uL (ref 4.22–5.81)
RDW: 13.1 % (ref 11.5–15.5)
WBC: 9.9 10*3/uL (ref 4.0–10.5)

## 2013-09-12 LAB — COMPREHENSIVE METABOLIC PANEL
ALBUMIN: 4 g/dL (ref 3.5–5.2)
ALK PHOS: 88 U/L (ref 39–117)
ALT: 15 U/L (ref 0–53)
AST: 21 U/L (ref 0–37)
Anion gap: 15 (ref 5–15)
BILIRUBIN TOTAL: 0.5 mg/dL (ref 0.3–1.2)
BUN: 8 mg/dL (ref 6–23)
CHLORIDE: 98 meq/L (ref 96–112)
CO2: 25 mEq/L (ref 19–32)
CREATININE: 1.02 mg/dL (ref 0.50–1.35)
Calcium: 9.7 mg/dL (ref 8.4–10.5)
GFR, EST NON AFRICAN AMERICAN: 86 mL/min — AB (ref >90)
GLUCOSE: 116 mg/dL — AB (ref 70–99)
POTASSIUM: 3.8 meq/L (ref 3.7–5.3)
Sodium: 138 mEq/L (ref 137–147)
Total Protein: 8.3 g/dL (ref 6.0–8.3)

## 2013-09-12 MED ORDER — OXYCODONE-ACETAMINOPHEN 5-325 MG PO TABS
2.0000 | ORAL_TABLET | Freq: Once | ORAL | Status: AC
  Administered 2013-09-12: 2 via ORAL
  Filled 2013-09-12: qty 2

## 2013-09-12 MED ORDER — LORAZEPAM 1 MG PO TABS
1.0000 mg | ORAL_TABLET | Freq: Once | ORAL | Status: AC
  Administered 2013-09-12: 1 mg via ORAL
  Filled 2013-09-12: qty 1

## 2013-09-12 MED ORDER — CLONIDINE HCL 0.2 MG PO TABS: 0.2000 mg | ORAL_TABLET | Freq: Two times a day (BID) | ORAL | Status: DC

## 2013-09-12 MED ORDER — CLONIDINE HCL 0.1 MG PO TABS
0.3000 mg | ORAL_TABLET | Freq: Once | ORAL | Status: AC
  Administered 2013-09-12: 0.3 mg via ORAL
  Filled 2013-09-12: qty 3

## 2013-09-12 MED ORDER — TRAMADOL HCL 50 MG PO TABS: 50.0000 mg | ORAL_TABLET | Freq: Four times a day (QID) | ORAL | Status: DC | PRN

## 2013-09-12 MED ORDER — CLONIDINE HCL 0.3 MG PO TABS: 0.3000 mg | ORAL_TABLET | Freq: Once | ORAL | Status: DC

## 2013-09-12 NOTE — ED Provider Notes (Signed)
CSN: [id number]     Arrival date & time 09/12/13  1151 History   First MD Initiated Contact with Patient 09/12/13 1221     Chief Complaint  Patient presents with  . Back Pain  . Rectal Bleeding     (Consider location/radiation/quality/duration/timing/severity/associated sxs/prior Treatment) HPI Comments: Feels his BP and HR are elevated. Has been taking his 0.2 mg clonidine 3-4x per day without relief of his tachycardia and HTN. BPs in the 160s/110s.  Patient is a 48 y.o. male presenting with back pain and hematochezia. The history is provided by the patient.  Back Pain Location:  Lumbar spine Quality:  Aching Radiates to:  Does not radiate Pain severity:  Moderate Pain is:  Same all the time Onset quality:  Gradual Timing:  Constant Progression:  Worsening Chronicity:  Chronic Context: emotional stress (brother died last week)   Relieved by:  Nothing Worsened by:  Nothing tried Associated symptoms: no fever   Rectal Bleeding Associated symptoms: no fever     Past Medical History  Diagnosis Date  . Hypertension   . Anxiety   . Tachycardia - pulse   . Degenerative disk disease   . Spinal stenosis   . Degenerative disk disease   . Tobacco abuse   . Obesity   . Polysubstance abuse   . Chest pain     Hospital, March, 2014, negative enzymes, patient refused in-hospital  stress test, patient canceled outpatient stress test   No past surgical history on file. Family History  Problem Relation Age of Onset  . Stroke Mother   . Hypertension Mother   . Stroke Father   . Hypertension Father   . Dementia Father   . Diabetes Father   . Heart disease Father   . Cancer Sister     brain  . Heart disease Brother    History  Substance Use Topics  . Smoking status: Light Tobacco Smoker -- 0.25 packs/day    Types: Cigarettes    Last Attempt to Quit: 06/10/2012  . Smokeless tobacco: Not on file  . Alcohol Use: No    Review of Systems  Constitutional: Negative for  fever.  Respiratory: Negative for cough and shortness of breath.   Gastrointestinal: Positive for hematochezia.  Musculoskeletal: Positive for back pain.  All other systems reviewed and are negative.     Allergies  Blueberry flavor; Other; Penicillins; Toradol; and Robaxin  Home Medications   Prior to Admission medications   Medication Sig Start Date End Date Taking? Authorizing Provider  aspirin 81 MG EC tablet Take 81 mg by mouth at bedtime.  05/30/12  Yes Amber Nydia BoutonM Hairford, MD  cloNIDine (CATAPRES) 0.2 MG tablet Take 1 tablet (0.2 mg total) by mouth 2 (two) times daily. 09/06/13  Yes Renee A Kuneff, DO  lisinopril (PRINIVIL,ZESTRIL) 20 MG tablet Take 1 tablet (20 mg total) by mouth daily. 07/27/13  Yes Janit PaganKehinde Eniola, MD  metoprolol tartrate (LOPRESSOR) 50 MG tablet Take 1 tablet (50 mg total) by mouth 2 (two) times daily. 07/27/13  Yes Janit PaganKehinde Eniola, MD  ranitidine (ZANTAC) 75 MG tablet Take 75 mg by mouth daily as needed for heartburn.    Yes Historical Provider, MD  traMADol (ULTRAM) 50 MG tablet Take 2 tablets (100 mg total) by mouth every 6 (six) hours as needed. 08/23/13  Yes Janit PaganKehinde Eniola, MD   BP 156/115  Pulse 120  Temp(Src) 98.3 F (36.8 C) (Oral)  Resp 23  SpO2 98% Physical Exam  Nursing note and vitals  reviewed. Constitutional: He is oriented to person, place, and time. He appears well-developed and well-nourished. No distress.  HENT:  Head: Normocephalic and atraumatic.  Mouth/Throat: Oropharynx is clear and moist. No oropharyngeal exudate.  Eyes: EOM are normal. Pupils are equal, round, and reactive to light.  Neck: Normal range of motion. Neck supple.  Cardiovascular: Regular rhythm.  Tachycardia present.  Exam reveals no friction rub.   No murmur heard. Pulmonary/Chest: Effort normal and breath sounds normal. No respiratory distress. He has no wheezes. He has no rales.  Abdominal: Soft. He exhibits no distension. There is no tenderness. There is no rebound.   Musculoskeletal: Normal range of motion. He exhibits no edema.  Neurological: He is alert and oriented to person, place, and time.  Skin: No rash noted. He is not diaphoretic.    ED Course  Procedures (including critical care time) Labs Review Labs Reviewed  CBC  COMPREHENSIVE METABOLIC PANEL    Imaging Review No results found.   EKG Interpretation   Date/Time:  Sunday September 12 2013 13:25:16 EDT Ventricular Rate:  105 PR Interval:  180 QRS Duration: 86 QT Interval:  343 QTC Calculation: 453 R Axis:   3 Text Interpretation:  Sinus tachycardia Borderline T wave abnormalities  Baseline wander in lead(s) V1 V2 V4 Similar to prior Confirmed by Gwendolyn Grant   MD, Bosco Paparella ([id number]) on 09/12/2013 3:16:42 PM      MDM   Final diagnoses:  Anxiety attack  Rectal bleeding    10 are old male presents with multiple chronic complaints. They seem to have been exacerbated by the death of his mother one week ago. He states hypertension with most concerning elevation in the diastolics in the 110s. He has been on clonidine 0.2 mg taking it 3-4 times a day. He has followup with a doctor about his high blood pressure but he has not had his clonidine changed. He's also having a lot of back pain. Has been previously weaned off methadone. He doesn't want any meds to go home but would like medication here. He is also feeling a he's having a panic attack. He reports rectal bleeding for the past several years, roughly every other day. He has followup with GI and is now on a rectal exam today. Offered labs to look at his overall status and he agreed. Patient given clonidine, Ativan, Percocet. Patient is tachycardic in the 120s and having mild diastolic hypertension here in 161W. Will continue to monitor after giving clonidine. After clondine BPs improved, HRs improved into the 110s. Resting comfortably. Labs ok. Hgb stable, no sign of anemia due to his GI bleed. Stable for discharge, given clonidine and tramadol,  will f/u with PCP.   Dagmar Hait, MD 09/12/13 [phone]

## 2013-09-12 NOTE — ED Notes (Signed)
Pt c/o low back pain x 15 years.  Pt c/o rectal bleeding x 1 year.  Pt c/o panic attacks x 8 years.

## 2013-09-12 NOTE — Discharge Instructions (Signed)
Panic Attacks °Panic attacks are sudden, short-lived surges of severe anxiety, fear, or discomfort. They may occur for no reason when you are relaxed, when you are anxious, or when you are sleeping. Panic attacks may occur for a number of reasons:  °· Healthy people occasionally have panic attacks in extreme, life-threatening situations, such as war or natural disasters. Normal anxiety is a protective mechanism of the body that helps us react to danger (fight or flight response). °· Panic attacks are often seen with anxiety disorders, such as panic disorder, social anxiety disorder, generalized anxiety disorder, and phobias. Anxiety disorders cause excessive or uncontrollable anxiety. They may interfere with your relationships or other life activities. °· Panic attacks are sometimes seen with other mental illnesses, such as depression and posttraumatic stress disorder. °· Certain medical conditions, prescription medicines, and drugs of abuse can cause panic attacks. °SYMPTOMS  °Panic attacks start suddenly, peak within 20 minutes, and are accompanied by four or more of the following symptoms: °· Pounding heart or fast heart rate (palpitations). °· Sweating. °· Trembling or shaking. °· Shortness of breath or feeling smothered. °· Feeling choked. °· Chest pain or discomfort. °· Nausea or strange feeling in your stomach. °· Dizziness, light-headedness, or feeling like you will faint. °· Chills or hot flushes. °· Numbness or tingling in your lips or hands and feet. °· Feeling that things are not real or feeling that you are not yourself. °· Fear of losing control or going crazy. °· Fear of dying. °Some of these symptoms can mimic serious medical conditions. For example, you may think you are having a heart attack. Although panic attacks can be very scary, they are not life threatening. °DIAGNOSIS  °Panic attacks are diagnosed through an assessment by your health care provider. Your health care provider will ask  questions about your symptoms, such as where and when they occurred. Your health care provider will also ask about your medical history and use of alcohol and drugs, including prescription medicines. Your health care provider may order blood tests or other studies to rule out a serious medical condition. Your health care provider may refer you to a mental health professional for further evaluation. °TREATMENT  °· Most healthy people who have one or two panic attacks in an extreme, life-threatening situation will not require treatment. °· The treatment for panic attacks associated with anxiety disorders or other mental illness typically involves counseling with a mental health professional, medicine, or a combination of both. Your health care provider will help determine what treatment is best for you. °· Panic attacks due to physical illness usually go away with treatment of the illness. If prescription medicine is causing panic attacks, talk with your health care provider about stopping the medicine, decreasing the dose, or substituting another medicine. °· Panic attacks due to alcohol or drug abuse go away with abstinence. Some adults need professional help in order to stop drinking or using drugs. °HOME CARE INSTRUCTIONS  °· Take all medicines as directed by your health care provider.   °· Schedule and attend follow-up visits as directed by your health care provider. It is important to keep all your appointments. °SEEK MEDICAL CARE IF: °· You are not able to take your medicines as prescribed. °· Your symptoms do not improve or get worse. °SEEK IMMEDIATE MEDICAL CARE IF:  °· You experience panic attack symptoms that are different than your usual symptoms. °· You have serious thoughts about hurting yourself or others. °· You are taking medicine for panic attacks and   have a serious side effect. °MAKE SURE YOU: °· Understand these instructions. °· Will watch your condition. °· Will get help right away if you are not  doing well or get worse. °Document Released: 02/18/2005 Document Revised: 02/23/2013 Document Reviewed: 10/02/2012 °ExitCare® Patient Information ©2015 [employer]. This information is not intended to replace advice given to you by your health care provider. Make sure you discuss any questions you have with your health care provider. ° °Emergency Department Resource Guide °1) Find a Doctor and Pay Out of Pocket °Although you won't have to find out who is covered by your insurance plan, it is a good idea to ask around and get recommendations. You will then need to call the office and see if the doctor you have chosen will accept you as a new patient and what types of options they offer for patients who are self-pay. Some doctors offer discounts or will set up payment plans for their patients who do not have insurance, but you will need to ask so you aren't surprised when you get to your appointment. ° °2) Contact Your Local Health Department °Not all health departments have doctors that can see patients for sick visits, but many do, so it is worth a call to see if yours does. If you don't know where your local health department is, you can check in your phone book. The CDC also has a tool to help you locate your state's health department, and many state websites also have listings of all of their local health departments. ° °3) Find a Walk-in Clinic °If your illness is not likely to be very severe or complicated, you may want to try a walk in clinic. These are popping up all over the country in pharmacies, drugstores, and shopping centers. They're usually staffed by nurse practitioners or physician assistants that have been trained to treat common illnesses and complaints. They're usually fairly quick and inexpensive. However, if you have serious medical issues or chronic medical problems, these are probably not your best option. ° °No Primary Care Doctor: °- Call [facility] at  832-8000 - they can help you  locate a primary care doctor that  accepts your insurance, provides certain services, etc. °- Physician Referral Service- 1-800-533-3463 ° °Chronic Pain Problems: °Organization         Address  Phone   Notes  °[facility]  (336) 297-2271 Patients need to be referred by their primary care doctor.  ° °Medication Assistance: °Organization         Address  Phone   Notes  °[place] [facility] [address] °[place], [place] [address] (336) 641-8030 --Must be a resident of [place] °-- Must have NO insurance coverage whatsoever (no Medicaid/ Medicare, etc.) °-- The pt. MUST have a primary care doctor that directs their care regularly and follows them in the community °  °[facility]  (866) 331-1348   °[employer]  (888) 892-1162   ° °Agencies that provide inexpensive medical care: °Organization         Address  Phone   Notes  °[facility]  (336) 832-8035   °[facility]    (336) 832-7272   °[facility] [address] °[place], [place] [address] (336) 832-4777   °[facility] [address], °[place] (336) 271-4999   °[facility]    (336) 373-0678   °[facility]    (336) 272-1050   °  [facility] ° [address], [place] Phone:  (336) 832-4444, Fax:  (336) 832-4440 Hours of Operation:  9 am - 6 pm, M-F.  Also accepts Medicaid/Medicare and self-pay.  °[facility] ° [address], [place] Phone: (336) 832-3150, Fax: (336) 832-3151. Hours of Operation:  8:30 am - 5:30 pm, M-F.  Also accepts Medicaid and self-pay.  °[facility] [place] [address], [place] Phone: (336) 878-6027   °[facility] [address], [place], [place] (336)723-1848, Ext. 123 Mondays & Thursdays: 7-9 AM.  First 15 patients are seen on a first come, first serve basis. °  ° °Medicaid-accepting [place]  Providers: ° °Organization         Address  Phone   Notes  °[facility] [address], [place] (336) 641-2100 Also accepts self-pay patients.  °[facility] [address], [place] ° (336) 856-9996   °[facility] [address], [place] (336) 288-8857   °[facility] [address] [place] [address], [place] (336) 299-7000   °Veita Bland [address], [place]  ° (336) 373-1557 Only accepts [place] Access Medicaid patients after they have their name applied to their card.  ° °Self-Pay (no insurance) in [place]: ° °Organization         Address  Phone   Notes  °Sickle Cell Patients, [facility] [address], [place] (336) 832-1970   °[facility] [address], [place] (336) 832-4400   °[facility] ° [address],  (336) 992-4800   °[facility]/Dr. Osei-Bonsu ° [address] [place] [address], [place] or [address], [place] (336) 841-8500 Phone number for both [place] and [place] locations is the same.  °[facility] [address], [place] (336) 299-0000   °[facility] [address] [place] [address], [place] or [address] (336) 852-7530 °(336) 878-2260   °[facility] [address], [place] (336) 350-1642, phone; (336) 294-5005, fax Sees patients 1st and 3rd Saturday of every month.  Must not qualify for public or private insurance (i.e. Medicaid, Medicare, [place] Health Choice, Veterans' Benefits) • Household income should be no more than 200% of the poverty level •The clinic cannot treat you if you are pregnant or think you are pregnant • Sexually transmitted diseases are not treated at the clinic.  ° ° °Dental Care: °Organization         Address  Phone  Notes  °[facility] [address],  (336) 641-6152 Accepts children up to age 21 who are enrolled in Medicaid or [place] Health Choice; pregnant women with a Medicaid card; and children who have applied for Medicaid or [place] Health Choice, but were declined, whose parents can pay a reduced fee at time of service.  °[facility] [place]  [address], [place] (336) 641-7733 Accepts children up to age 21 who are enrolled in Medicaid or [place] Health Choice; pregnant women with a Medicaid card; and children who have applied for Medicaid or [place] Health Choice, but were declined, whose parents can pay a reduced fee at time of service.  °[facility] ° [address], [place] (336) 641-4533 Patients are seen by appointment only. Walk-ins are not accepted. [facility] will see patients 18 years of age and older. °Monday - Tuesday (8am-5pm) °Most Wednesdays (8:30-5pm) °$30 per visit, cash only  °[facility] ° [address], [place] (336) 641-4533 Patients are seen by appointment only. Walk-ins are not accepted. [facility] will see patients 18 years of age and older. °One Wednesday Evening (Monthly: Volunteer Based).  $30 per visit, cash only  °[facility]  (919) 537-3737 for adults; Children under age 4, call Graduate Pediatric Dentistry at (919) [phone]. Children aged 4-14, please call (919) [phone] to request a pediatric application. ° Dental services are provided in all areas of dental care including fillings, crowns and bridges, complete and partial dentures, implants, gum treatment, root canals, and extractions. Preventive care is also provided. Treatment is provided to both adults and children. °Patients are selected via a lottery and there is often a waiting list. °  °[facility] [address], °[place] ° (336) 763-8833 [email or web address] °  °[facility]  [address], [place], [place] (336)723-1848, Ext. 123 Second and Fourth Thursday of each month, opens at 6:30 AM; Clinic ends at 9 AM.  Patients are seen on a first-come first-served basis, and a limited number are seen during each clinic.  ° °[facility] ° [address], [place], [place] (336) 723-7904   Eligibility Requirements °You must have lived in [place], [place], or [place] counties for at least the last three months. °  You cannot be eligible for state or federal sponsored healthcare insurance, including Veterans Administration, Medicaid, or Medicare. °  You generally cannot be eligible for healthcare insurance through your employer.  °  How to apply: °Eligibility screenings are held every Tuesday and Wednesday afternoon from 1:00 pm until 4:00 pm. You do not need an appointment for the interview!  °[facility] [address], [place],  [place] 336-631-2330   °[facility]  336-342-8273   °[facility]  336-703-3100   °[facility]  336-570-6415   ° °Behavioral Health Resources in the Community: °Intensive Outpatient Programs °Organization         Address  Phone  Notes  °[facility] [address], [place], [place] 336-878-6098   °[facility] [address], [place], [place] 336-832-9800   °[facility] [address], [place], [place] ° 336-882-2125   °[facility] [address],  °[place], [place] 1-800-853-5163 or 336-641-4981   °Substance Abuse Resources °Organization         Address  Phone  Notes  °[facility]  336-882-2125   °[facility]  336-784-9470   °[facility]  336-285-9073   °[facility]  336-845-3988   °Residential & Outpatient Substance Abuse Program  1-800-659-3381   °Psychological Services °Organization         Address  Phone  Notes  °[facility]  336- 832-9600     °[facility]  336- 378-7881   °[facility] [address], [place] 1-800-853-5163 or 336-641-4981   ° °Mobile Crisis Teams °Organization         Address  Phone  Notes  °[facility], Mobile Crisis Care Unit  1-877-626-1772   °Assertive °[facility] ° [address] [place],  336-834-9664   °  Sharon DeEsch [address] °[place] 336-554-5454   ° °Self-Help/Support Groups °Organization         Address  Phone             Notes  °[facility] - variety of support groups  336- 373-1402 Call for more information  °Narcotics Anonymous (NA), [facility] [address], °[place]  2 meetings at this location  ° °Residential Treatment Programs °Organization         Address  Phone  Notes  °[facility] [address],    °[place]  1-866-801-8205   °[facility] ° [address], [place], [place] 704-293-8524   °[facility] [address], [place] 336-845-3988 Admissions: 8am-3pm M-F  °[facility] [address]    °[place], [place] 336-841-1104   °[facility] [address], [place], [place] 336-379-7146   °[facility] [address]  °[place], [place] 336-285-9073   °[facility] - Intensive Outpatient [address], [place], [place] 336-852-3033   °[facility] [address]  °[place], [place] 1-877-615-2722 or 336-784-9470   °[facility] [address] [place], [place] 336-227-7417 Accepts Medicaid  °[facility] [address]  °[place] 1-800-659-3381 Substance Abuse/Addiction Treatment  ° °[place] Behavioral Health Resources °Organization         Address  Phone  Notes  °[facility]  (888) 581-9988   °Julie Brannon, PhD [address] [place], [place]   (336) 349-5553 or (336) 951-0000    °[facility]   [address] °[place], [place] (336) 349-4454   °[facility] [address], [place], [place] (336) 342-8316 Insurance/Medicaid/sponsorship through [facility]  °[facility] [address]                                    [place], [place] (336) 342-8316 Therapy/tele-psych/case  °[facility] [address]  ° [place], [place] (336) 349-2233    °Dr. Arfeen  (336) 349-4544   °[facility] [place]  [employer] [place] [facility] 1) [address], [place] °2) [address], [place] °3)  [address], [place] (336) 349-3220 °(336) 342-7768 ° °(336) 342-8140   °[place] [facility] (336) 342-1394 or (336) 342-3537 (After Hours)    ° ° ° °

## 2013-09-25 ENCOUNTER — Encounter (HOSPITAL_COMMUNITY): Payer: Self-pay | Admitting: Emergency Medicine

## 2013-09-25 ENCOUNTER — Emergency Department (HOSPITAL_COMMUNITY)
Admission: EM | Admit: 2013-09-25 | Discharge: 2013-09-25 | Disposition: A | Discharge status: Home / Self Care | Payer: No Typology Code available for payment source | Attending: Emergency Medicine | Admitting: Emergency Medicine

## 2013-09-25 DIAGNOSIS — Z7982 Long term (current) use of aspirin: Secondary | ICD-10-CM | POA: Insufficient documentation

## 2013-09-25 DIAGNOSIS — Z880 Allergy status to penicillin: Secondary | ICD-10-CM | POA: Insufficient documentation

## 2013-09-25 DIAGNOSIS — Z79899 Other long term (current) drug therapy: Secondary | ICD-10-CM | POA: Insufficient documentation

## 2013-09-25 DIAGNOSIS — K625 Hemorrhage of anus and rectum: Secondary | ICD-10-CM

## 2013-09-25 DIAGNOSIS — E669 Obesity, unspecified: Secondary | ICD-10-CM | POA: Insufficient documentation

## 2013-09-25 DIAGNOSIS — K921 Melena: Secondary | ICD-10-CM | POA: Insufficient documentation

## 2013-09-25 DIAGNOSIS — M545 Low back pain, unspecified: Secondary | ICD-10-CM | POA: Insufficient documentation

## 2013-09-25 DIAGNOSIS — I10 Essential (primary) hypertension: Secondary | ICD-10-CM | POA: Insufficient documentation

## 2013-09-25 DIAGNOSIS — F17200 Nicotine dependence, unspecified, uncomplicated: Secondary | ICD-10-CM | POA: Insufficient documentation

## 2013-09-25 LAB — HEMOGLOBIN AND HEMATOCRIT, BLOOD
HEMATOCRIT: 42.7 % (ref 39.0–52.0)
Hemoglobin: 14.5 g/dL (ref 13.0–17.0)

## 2013-09-25 MED ORDER — ONDANSETRON 4 MG PO TBDP
4.0000 mg | ORAL_TABLET | Freq: Once | ORAL | Status: AC
  Administered 2013-09-25: 4 mg via ORAL
  Filled 2013-09-25: qty 1

## 2013-09-25 MED ORDER — OXYCODONE-ACETAMINOPHEN 5-325 MG PO TABS
2.0000 | ORAL_TABLET | Freq: Once | ORAL | Status: AC
  Administered 2013-09-25: 2 via ORAL
  Filled 2013-09-25: qty 2

## 2013-09-25 MED ORDER — TRAMADOL HCL 50 MG PO TABS: 100.0000 mg | ORAL_TABLET | Freq: Four times a day (QID) | ORAL | Status: DC | PRN

## 2013-09-25 MED ORDER — MORPHINE SULFATE 4 MG/ML IJ SOLN
4.0000 mg | Freq: Once | INTRAMUSCULAR | Status: AC
  Administered 2013-09-25: 4 mg via INTRAMUSCULAR
  Filled 2013-09-25: qty 1

## 2013-09-25 MED ORDER — LISINOPRIL 20 MG PO TABS: 20.0000 mg | ORAL_TABLET | Freq: Every day | ORAL | Status: DC

## 2013-09-25 NOTE — ED Provider Notes (Signed)
Medical screening examination/treatment/procedure(s) were performed by non-physician practitioner and as supervising physician I was immediately available for consultation/collaboration.   EKG Interpretation None        Urania Pearlman W Meyah Corle, MD 09/25/13 1612 

## 2013-09-25 NOTE — ED Provider Notes (Signed)
CSN: [id number]     Arrival date & time 09/25/13  1018 History   First MD Initiated Contact with Patient 09/25/13 1030     Chief Complaint  Patient presents with  . Rectal Bleeding  . Back Pain     (Consider location/radiation/quality/duration/timing/severity/associated sxs/prior Treatment) HPI  Patient presents to the ED with complaints of rectal bleeding and low back pain. He has chronic back pain and chronic rectal bleeding. He has been seen for both of these multiple times. He has a gastroenterologist appointment coming up on August 3 for colonoscopy and further evaluation. He has chronic low back pain with spinal stenosis and feels that this pain is worsened and different. His PCP and him recently had an argument about Clonidine, he says that he adjusted his medication doses himself and has been running out too early. He has Ultram at home for pain. He passes "regular colored" blood only with bowel movements only. Has a urology appointment coming up soon to have his prostate checked out as well. Hemodynamically stable.  Past Medical History  Diagnosis Date  . Hypertension   . Anxiety   . Tachycardia - pulse   . Degenerative disk disease   . Spinal stenosis   . Degenerative disk disease   . Tobacco abuse   . Obesity   . Polysubstance abuse   . Chest pain     Hospital, March, 2014, negative enzymes, patient refused in-hospital  stress test, patient canceled outpatient stress test   History reviewed. No pertinent past surgical history. Family History  Problem Relation Age of Onset  . Stroke Mother   . Hypertension Mother   . Stroke Father   . Hypertension Father   . Dementia Father   . Diabetes Father   . Heart disease Father   . Cancer Sister     brain  . Heart disease Brother    History  Substance Use Topics  . Smoking status: Light Tobacco Smoker -- 0.25 packs/day    Types: Cigarettes    Last Attempt to Quit: 06/10/2012  . Smokeless tobacco: Not on file  .  Alcohol Use: No    Review of Systems   Review of Systems  Gen: no weight loss, fevers, chills, night sweats  Eyes: no discharge or drainage, no occular pain or visual changes  Nose: no epistaxis or rhinorrhea  Mouth: no dental pain, no sore throat  Neck: no neck pain  Lungs:No wheezing or hemoptysis No coughing CV:  No palpitations, dependent edema or orthopnea. No chest pain Abd: no diarrhea. No nausea or vomiting, No abdominal pain + rectal bleeding GU: no dysuria or gross hematuria  MSK:  No muscle weakness, + back  pain Neuro: no headache, no focal neurologic deficits  Skin: no rash , no wounds Psyche: no complaints    Allergies  Blueberry flavor; Other; Penicillins; Robaxin; and Toradol  Home Medications   Prior to Admission medications   Medication Sig Start Date End Date Taking? Authorizing Provider  aspirin 81 MG EC tablet Take 81 mg by mouth at bedtime.  05/30/12  Yes Amber Nydia BoutonM [place], MD  cloNIDine (CATAPRES) 0.3 MG tablet Take 0.3 mg by mouth every morning.   Yes Historical Provider, MD  metoprolol tartrate (LOPRESSOR) 50 MG tablet Take 1 tablet (50 mg total) by mouth 2 (two) times daily. 07/27/13  Yes Janit PaganKehinde Eniola, MD  ranitidine (ZANTAC) 75 MG tablet Take 75 mg by mouth daily as needed for heartburn.    Yes Historical Provider, MD  lisinopril (PRINIVIL,ZESTRIL) 20 MG tablet Take 1 tablet (20 mg total) by mouth daily. 09/25/13   Rashed Edler Irine Seal, PA-C  traMADol (ULTRAM) 50 MG tablet Take 2 tablets (100 mg total) by mouth every 6 (six) hours as needed for moderate pain. 09/25/13   Jackelyne Sayer Irine Seal, PA-C   BP 124/80  Pulse 67  Temp(Src) 98.2 F (36.8 C) (Oral)  Resp 16  SpO2 96% Physical Exam  Nursing note and vitals reviewed. Constitutional: He appears well-developed and well-nourished. No distress.  HENT:  Head: Normocephalic and atraumatic.  Eyes: Pupils are equal, round, and reactive to light.  Neck: Normal range of motion. Neck supple.  Cardiovascular:  Normal rate and regular rhythm.   Pulmonary/Chest: Effort normal.  Abdominal: Soft.  Genitourinary: Guaiac positive stool.  No gross bleeding noted to rectum. No stool in rectal vault  Musculoskeletal:  Pt ambulates with cane at baseline.  Pt has equal strength to bilateral lower extremities.  Neurosensory function adequate to both legs No clonus on dorsiflextion Skin color is normal. Skin is warm and moist.  I see no step off deformity, no midline bony tenderness.  Pt is able to ambulate with cane  No crepitus, laceration, effusion, induration, lesions, swelling.   Pedal pulses are symmetrical and palpable bilaterally  Bilateral lower lumbar tenderness to palpation of paraspinel muscles   Neurological: He is alert.  Skin: Skin is warm and dry.    ED Course  Procedures (including critical care time) Labs Review Labs Reviewed  HEMOGLOBIN AND HEMATOCRIT, BLOOD  OCCULT BLOOD X 1 CARD TO LAB, STOOL  POC OCCULT BLOOD, ED    Imaging Review No results found.   EKG Interpretation None      MDM   Final diagnoses:  Bilateral low back pain without sciatica  Rectal bleeding    No gross bleeding, only with bowel movement and this is chronic. He is going to see GI on August 3 and a urologist soon as well. He is out of his Tramadol, lisinopril and Clonidine. Will refill Tramadol and Lisinopril.     She has had hemoccult positive.   Back pain appears chronic with no new symptoms and no weakness.   48 y.o.Travis [name]'s  with back pain. No neurological deficits and normal neuro exam. Patient can walk. No loss of bowel or bladder control. No concern for cauda equina at this time base on HPI and physical exam findings. No fever, night sweats, weight loss, h/o cancer, IVDU.   RICE protocol and pain medicine indicated and discussed with patient.   Patient Plan 1. Medications: pain medication Cont usual home medications unless otherwise directed. 2. Treatment: rest, drink plenty  of fluids, gentle stretching as discussed, alternate ice and heat  3. Follow Up: Please followup with your primary doctor for discussion of your diagnoses and further evaluation after today's visit; if you do not have a primary care doctor use the resource guide provided to find one   Vital signs are stable at discharge. Filed Vitals:   09/25/13 1245  BP: 124/80  Pulse: 67  Temp:   Resp:     Patient/guardian has voiced understanding and agreed to follow-up with the PCP or specialist.        Dorthula Matas, PA-C 09/25/13 1408

## 2013-09-25 NOTE — Discharge Instructions (Signed)
Back Pain, Adult °Low back pain is very common. About 1 in 5 people have back pain. The cause of low back pain is rarely dangerous. The pain often gets better over time. About half of people with a sudden onset of back pain feel better in just 2 weeks. About 8 in 10 people feel better by 6 weeks.  °CAUSES °Some common causes of back pain include: °· Strain of the muscles or ligaments supporting the spine. °· Wear and tear (degeneration) of the spinal discs. °· Arthritis. °· Direct injury to the back. °DIAGNOSIS °Most of the time, the direct cause of low back pain is not known. However, back pain can be treated effectively even when the exact cause of the pain is unknown. Answering your caregiver's questions about your overall health and symptoms is one of the most accurate ways to make sure the cause of your pain is not dangerous. If your caregiver needs more information, he or she may order lab work or imaging tests (X-rays or MRIs). However, even if imaging tests show changes in your back, this usually does not require surgery. °HOME CARE INSTRUCTIONS °For many people, back pain returns. Since low back pain is rarely dangerous, it is often a condition that people can learn to manage on their own.  °· Remain active. It is stressful on the back to sit or stand in one place. Do not sit, drive, or stand in one place for more than 30 minutes at a time. Take short walks on level surfaces as soon as pain allows. Try to increase the length of time you walk each day. °· Do not stay in bed. Resting more than 1 or 2 days can delay your recovery. °· Do not avoid exercise or work. Your body is made to move. It is not dangerous to be active, even though your back may hurt. Your back will likely heal faster if you return to being active before your pain is gone. °· Pay attention to your body when you  bend and lift. Many people have less discomfort when lifting if they bend their knees, keep the load close to their bodies, and  avoid twisting. Often, the most comfortable positions are those that put less stress on your recovering back. °· Find a comfortable position to sleep. Use a firm mattress and lie on your side with your knees slightly bent. If you lie on your back, put a pillow under your knees. °· Only take over-the-counter or prescription medicines as directed by your caregiver. Over-the-counter medicines to reduce pain and inflammation are often the most helpful. Your caregiver may prescribe muscle relaxant drugs. These medicines help dull your pain so you can more quickly return to your normal activities and healthy exercise. °· Put ice on the injured area. °¨ Put ice in a plastic bag. °¨ Place a towel between your skin and the bag. °¨ Leave the ice on for 15-20 minutes, 03-04 times a day for the first 2 to 3 days. After that, ice and heat may be alternated to reduce pain and spasms. °· Ask your caregiver about trying back exercises and gentle massage. This may be of some benefit. °· Avoid feeling anxious or stressed. Stress increases muscle tension and can worsen back pain. It is important to recognize when you are anxious or stressed and learn ways to manage it. Exercise is a great option. °SEEK MEDICAL CARE IF: °· You have pain that is not relieved with rest or medicine. °· You have pain that does not improve in 1 week. °· You have new symptoms. °· You are generally not feeling well. °SEEK   IMMEDIATE MEDICAL CARE IF:   You have pain that radiates from your back into your legs. Bloody Stools Bloody stools often mean that there is a problem in the digestive tract. Your caregiver may use the term "melena" to describe black, tarry, and bad smelling stools or "hematochezia" to describe red or maroon-colored stools. Blood seen in the stool can be caused by bleeding anywhere along the intestinal tract.  A black stool usually means that blood is coming from the upper part of the gastrointestinal tract (esophagus, stomach, or small  bowel). Passing maroon-colored stools or bright red blood usually means that blood is coming from lower down in the large bowel or the rectum. However, sometimes massive bleeding in the stomach or small intestine can cause bright red bloody stools.  Consuming black licorice, lead, iron pills, medicines containing bismuth subsalicylate, or blueberries can also cause black stools. Your caregiver can test black stools to see if blood is present. It is important that the cause of the bleeding be found. Treatment can then be started, and the problem can be corrected. Rectal bleeding may not be serious, but you should not assume everything is okay until you know the cause.It is very important to follow up with your caregiver or a specialist in gastrointestinal problems. CAUSES  Blood in the stools can come from various underlying causes.Often, the cause is not found during your first visit. Testing is often needed to discover the cause of bleeding in the gastrointestinal tract. Causes range from simple to serious or even life-threatening.Possible causes include: Hemorrhoids.These are veins that are full of blood (engorged) in the rectum. They cause pain, inflammation, and may bleed. Anal fissures.These are areas of painful tearing which may bleed. They are often caused by passing hard stool. Diverticulosis.These are pouches that form on the colon over time, with age, and may bleed significantly. Diverticulitis.This is inflammation in areas with diverticulosis. It can cause pain, fever, and bloody stools, although bleeding is rare. Proctitis and colitis. These are inflamed areas of the rectum or colon. They may cause pain, fever, and bloody stools. Polyps and cancer. Colon cancer is a leading cause of preventable cancer death.It often starts out as precancerous polyps that can be removed during a colonoscopy, preventing progression into cancer. Sometimes, polyps and cancer may cause rectal  bleeding. Gastritis and ulcers.Bleeding from the upper gastrointestinal tract (near the stomach) may travel through the intestines and produce black, sometimes tarry, often bad smelling stools. In certain cases, if the bleeding is fast enough, the stools may not be black, but red and the condition may be life-threatening. SYMPTOMS  You may have stools that are bright red and bloody, that are normal color with blood on them, or that are dark black and tarry. In some cases, you may only have blood in the toilet bowl. Any of these cases need medical care. You may also have: Pain at the anus or anywhere in the rectum. Lightheadedness or feeling faint. Extreme weakness. Nausea or vomiting. Fever. DIAGNOSIS Your caregiver may use the following methods to find the cause of your bleeding: Taking a medical history. Age is important. Older people tend to develop polyps and cancer more often. If there is anal pain and a hard, large stool associated with bleeding, a tear of the anus may be the cause. If blood drips into the toilet after a bowel movement, bleeding hemorrhoids may be the problem. The color and frequency of the bleeding are additional considerations. In most cases, the medical history provides  clues, but seldom the final answer. A visual and finger (digital) exam. Your caregiver will inspect the anal area, looking for tears and hemorrhoids. A finger exam can provide information when there is tenderness or a growth inside. In men, the prostate is also examined. Endoscopy. Several types of small, long scopes (endoscopes) are used to view the colon. In the office, your caregiver may use a rigid, or more commonly, a flexible viewing sigmoidoscope. This exam is called flexible sigmoidoscopy. It is performed in 5 to 10 minutes. A more thorough exam is accomplished with a colonoscope. It allows your caregiver to view the entire 5 to 6 foot long colon. Medicine to help you relax (sedative) is usually given  for this exam. Frequently, a bleeding lesion may be present beyond the reach of the sigmoidoscope. So, a colonoscopy may be the best exam to start with. Both exams are usually done on an outpatient basis. This means the patient does not stay overnight in the hospital or surgery center. An upper endoscopy may be needed to examine your stomach. Sedation is used and a flexible endoscope is put in your mouth, down to your stomach. A barium enema X-ray. This is an X-ray exam. It uses liquid barium inserted by enema into the rectum. This test alone may not identify an actual bleeding point. X-rays highlight abnormal shadows, such as those made by lumps (tumors), diverticuli, or colitis. TREATMENT  Treatment depends on the cause of your bleeding.  For bleeding from the stomach or colon, the caregiver doing your endoscopy or colonoscopy may be able to stop the bleeding as part of the procedure. Inflammation or infection of the colon can be treated with medicines. Many rectal problems can be treated with creams, suppositories, or warm baths. Surgery is sometimes needed. Blood transfusions are sometimes needed if you have lost a lot of blood. For any bleeding problem, let your caregiver know if you take aspirin or other blood thinners regularly. HOME CARE INSTRUCTIONS  Take any medicines exactly as prescribed. Keep your stools soft by eating a diet high in fiber. Prunes (1 to 3 a day) work well for many people. Drink enough water and fluids to keep your urine clear or pale yellow. Take sitz baths if advised. A sitz bath is when you sit in a bathtub with warm water for 10 to 15 minutes to soak, soothe, and cleanse the rectal area. If enemas or suppositories are advised, be sure you know how to use them. Tell your caregiver if you have problems with this. Monitor your bowel movements to look for signs of improvement or worsening. SEEK MEDICAL CARE IF:  You do not improve in the time expected. Your condition  worsens after initial improvement. You develop any new symptoms. SEEK IMMEDIATE MEDICAL CARE IF:  You develop severe or prolonged rectal bleeding. You vomit blood. You feel weak or faint. You have a fever. MAKE SURE YOU: Understand these instructions. Will watch your condition. Will get help right away if you are not doing well or get worse. Document Released: 02/08/2002 Document Revised: 05/13/2011 Document Reviewed: 07/06/2010 [facility] Patient Information 2015 [place], [place]. This information is not intended to replace advice given to you by your health care provider. Make sure you discuss any questions you have with your health care provider.   You develop new bowel or bladder control problems.  You have unusual weakness or numbness in your arms or legs.  You develop nausea or vomiting.  You develop abdominal pain.  You feel  faint. Document Released: 02/18/2005 Document Revised: 08/20/2011 Document Reviewed: 06/22/2013 [facility] Patient Information 2015 [place]. This information is not intended to replace advice given to you by your health care provider. Make sure you discuss any questions you have with your health care provider.

## 2013-09-25 NOTE — ED Notes (Signed)
Pt c/o rectal bleeding x 3 months and then lower back pain that is different than chronic back pain for 3-704months. Pt stated that pain has increased. Blood in stool is not dark and not bright but "normal red". Stated that he did see 1-2 blood clots in stool. CT scan done 5-6 weeks ago by PCP who is no longer his provider and it was normal.

## 2013-09-27 LAB — POC OCCULT BLOOD, ED: Fecal Occult Bld: POSITIVE — AB

## 2013-09-28 ENCOUNTER — Emergency Department (HOSPITAL_COMMUNITY)
Admission: EM | Admit: 2013-09-28 | Discharge: 2013-09-29 | Disposition: A | Discharge status: Home / Self Care | Payer: No Typology Code available for payment source | Attending: Emergency Medicine | Admitting: Emergency Medicine

## 2013-09-28 ENCOUNTER — Encounter (HOSPITAL_COMMUNITY): Payer: Self-pay | Admitting: Emergency Medicine

## 2013-09-28 DIAGNOSIS — S51812A Laceration without foreign body of left forearm, initial encounter: Secondary | ICD-10-CM

## 2013-09-28 DIAGNOSIS — Z880 Allergy status to penicillin: Secondary | ICD-10-CM | POA: Insufficient documentation

## 2013-09-28 DIAGNOSIS — E669 Obesity, unspecified: Secondary | ICD-10-CM | POA: Insufficient documentation

## 2013-09-28 DIAGNOSIS — F1310 Sedative, hypnotic or anxiolytic abuse, uncomplicated: Secondary | ICD-10-CM | POA: Insufficient documentation

## 2013-09-28 DIAGNOSIS — Z8739 Personal history of other diseases of the musculoskeletal system and connective tissue: Secondary | ICD-10-CM | POA: Insufficient documentation

## 2013-09-28 DIAGNOSIS — X789XXA Intentional self-harm by unspecified sharp object, initial encounter: Secondary | ICD-10-CM | POA: Insufficient documentation

## 2013-09-28 DIAGNOSIS — Z79899 Other long term (current) drug therapy: Secondary | ICD-10-CM | POA: Insufficient documentation

## 2013-09-28 DIAGNOSIS — F411 Generalized anxiety disorder: Secondary | ICD-10-CM | POA: Insufficient documentation

## 2013-09-28 DIAGNOSIS — F329 Major depressive disorder, single episode, unspecified: Secondary | ICD-10-CM | POA: Insufficient documentation

## 2013-09-28 DIAGNOSIS — F3289 Other specified depressive episodes: Secondary | ICD-10-CM | POA: Insufficient documentation

## 2013-09-28 DIAGNOSIS — Z008 Encounter for other general examination: Secondary | ICD-10-CM | POA: Insufficient documentation

## 2013-09-28 DIAGNOSIS — S51809A Unspecified open wound of unspecified forearm, initial encounter: Secondary | ICD-10-CM | POA: Insufficient documentation

## 2013-09-28 DIAGNOSIS — F17200 Nicotine dependence, unspecified, uncomplicated: Secondary | ICD-10-CM | POA: Insufficient documentation

## 2013-09-28 DIAGNOSIS — F32A Depression, unspecified: Secondary | ICD-10-CM

## 2013-09-28 DIAGNOSIS — I10 Essential (primary) hypertension: Secondary | ICD-10-CM | POA: Insufficient documentation

## 2013-09-28 LAB — ETHANOL: Alcohol, Ethyl (B): <11 mg/dL (ref 0–11)

## 2013-09-28 LAB — COMPREHENSIVE METABOLIC PANEL
ALT: 20 U/L (ref 0–53)
AST: 23 U/L (ref 0–37)
Albumin: 3.6 g/dL (ref 3.5–5.2)
Alkaline Phosphatase: 79 U/L (ref 39–117)
Anion gap: 10 (ref 5–15)
BILIRUBIN TOTAL: 0.5 mg/dL (ref 0.3–1.2)
BUN: 6 mg/dL (ref 6–23)
CALCIUM: 9.3 mg/dL (ref 8.4–10.5)
CHLORIDE: 97 meq/L (ref 96–112)
CO2: 28 meq/L (ref 19–32)
Creatinine, Ser: 0.99 mg/dL (ref 0.50–1.35)
GLUCOSE: 119 mg/dL — AB (ref 70–99)
Potassium: 4.5 mEq/L (ref 3.7–5.3)
SODIUM: 135 meq/L — AB (ref 137–147)
Total Protein: 7.6 g/dL (ref 6.0–8.3)

## 2013-09-28 LAB — CBC
HCT: 42.4 % (ref 39.0–52.0)
HEMOGLOBIN: 14.4 g/dL (ref 13.0–17.0)
MCH: 29.1 pg (ref 26.0–34.0)
MCHC: 34 g/dL (ref 30.0–36.0)
MCV: 85.8 fL (ref 78.0–100.0)
Platelets: 180 10*3/uL (ref 150–400)
RBC: 4.94 MIL/uL (ref 4.22–5.81)
RDW: 13.1 % (ref 11.5–15.5)
WBC: 8.3 10*3/uL (ref 4.0–10.5)

## 2013-09-28 LAB — RAPID URINE DRUG SCREEN, HOSP PERFORMED
Amphetamines: NOT DETECTED
Barbiturates: NOT DETECTED
Benzodiazepines: POSITIVE — AB
Cocaine: NOT DETECTED
Opiates: NOT DETECTED
Tetrahydrocannabinol: NOT DETECTED

## 2013-09-28 LAB — SALICYLATE LEVEL: Salicylate Lvl: <2 mg/dL — ABNORMAL LOW (ref 2.8–20.0)

## 2013-09-28 LAB — ACETAMINOPHEN LEVEL

## 2013-09-28 MED ORDER — ONDANSETRON HCL 4 MG PO TABS: 4.0000 mg | ORAL_TABLET | Freq: Three times a day (TID) | ORAL | Status: DC | PRN

## 2013-09-28 MED ORDER — CLONIDINE HCL 0.1 MG PO TABS: 0.3000 mg | ORAL_TABLET | ORAL | Status: DC

## 2013-09-28 MED ORDER — LORAZEPAM 1 MG PO TABS
1.0000 mg | ORAL_TABLET | Freq: Three times a day (TID) | ORAL | Status: DC | PRN
  Administered 2013-09-28: 1 mg via ORAL
  Filled 2013-09-28: qty 1

## 2013-09-28 MED ORDER — ZOLPIDEM TARTRATE 5 MG PO TABS
5.0000 mg | ORAL_TABLET | Freq: Every evening | ORAL | Status: DC | PRN
  Administered 2013-09-28: 5 mg via ORAL
  Filled 2013-09-28: qty 1

## 2013-09-28 MED ORDER — METOPROLOL TARTRATE 25 MG PO TABS
50.0000 mg | ORAL_TABLET | Freq: Two times a day (BID) | ORAL | Status: DC
  Administered 2013-09-28: 50 mg via ORAL
  Filled 2013-09-28: qty 2

## 2013-09-28 MED ORDER — ALUM & MAG HYDROXIDE-SIMETH 200-200-20 MG/5ML PO SUSP: 30.0000 mL | ORAL | Status: DC | PRN

## 2013-09-28 MED ORDER — HYDROCODONE-ACETAMINOPHEN 5-325 MG PO TABS: 1.0000 | ORAL_TABLET | Freq: Once | ORAL | Status: DC

## 2013-09-28 MED ORDER — NICOTINE 21 MG/24HR TD PT24
21.0000 mg | MEDICATED_PATCH | Freq: Every day | TRANSDERMAL | Status: DC
  Administered 2013-09-28: 21 mg via TRANSDERMAL
  Filled 2013-09-28: qty 1

## 2013-09-28 MED ORDER — TRAMADOL HCL 50 MG PO TABS: 100.0000 mg | ORAL_TABLET | Freq: Four times a day (QID) | ORAL | Status: DC | PRN

## 2013-09-28 MED ORDER — TRAMADOL HCL 50 MG PO TABS
100.0000 mg | ORAL_TABLET | Freq: Once | ORAL | Status: AC
  Administered 2013-09-28: 100 mg via ORAL
  Filled 2013-09-28: qty 2

## 2013-09-28 MED ORDER — ACETAMINOPHEN 325 MG PO TABS
650.0000 mg | ORAL_TABLET | Freq: Once | ORAL | Status: AC
  Administered 2013-09-28: 650 mg via ORAL
  Filled 2013-09-28: qty 2

## 2013-09-28 MED ORDER — LISINOPRIL 20 MG PO TABS: 20.0000 mg | ORAL_TABLET | Freq: Every morning | ORAL | Status: DC

## 2013-09-28 NOTE — ED Notes (Signed)
Bed: WA25 Expected date:  Expected time:  Means of arrival:  Comments: Hall B 

## 2013-09-28 NOTE — ED Notes (Signed)
Bed: WLPT2 Expected date:  Expected time:  Means of arrival:  Comments: EMS-wrist laceration

## 2013-09-28 NOTE — ED Provider Notes (Signed)
CSN: [id number]     Arrival date & time 09/28/13  1342 History   First MD Initiated Contact with Patient 09/28/13 1457     Chief Complaint  Patient presents with  . Extremity Laceration  . Psychiatric Evaluation     (Consider location/radiation/quality/duration/timing/severity/associated sxs/prior Treatment) HPI Comments: 48 year old male with a past medical history of anxiety, hypertension, degenerative disc disease and polysubstance abuse presents to the emergency department with multiple self-inflicted lacerations to his left forearm occurring in a few hours prior to arrival. Patient states that he was arguing with his mother, got angry and cut his arm to release stress and anger. States he does not want to harm himself or anybody else, denies suicidal ideations, states this was only to release his anger. States he and his mother tend to argue frequently, but this morning he "could not take it". He cut his arm with a razor blade. Last tetanus shot 2 years ago. Denies numbness or tingling. States he is in a great amount of pain at this time.  The history is provided by the patient.    Past Medical History  Diagnosis Date  . Hypertension   . Anxiety   . Tachycardia - pulse   . Degenerative disk disease   . Spinal stenosis   . Degenerative disk disease   . Tobacco abuse   . Obesity   . Polysubstance abuse   . Chest pain     Hospital, March, 2014, negative enzymes, patient refused in-hospital  stress test, patient canceled outpatient stress test   History reviewed. No pertinent past surgical history. Family History  Problem Relation Age of Onset  . Stroke Mother   . Hypertension Mother   . Stroke Father   . Hypertension Father   . Dementia Father   . Diabetes Father   . Heart disease Father   . Cancer Sister     brain  . Heart disease Brother    History  Substance Use Topics  . Smoking status: Light Tobacco Smoker -- 0.25 packs/day    Types: Cigarettes    Last Attempt  to Quit: 06/10/2012  . Smokeless tobacco: Not on file  . Alcohol Use: No    Review of Systems  Skin: Positive for wound.  Psychiatric/Behavioral: Positive for self-injury and dysphoric mood. The patient is nervous/anxious.   All other systems reviewed and are negative.     Allergies  Blueberry flavor; Other; Penicillins; Robaxin; and Toradol  Home Medications   Prior to Admission medications   Medication Sig Start Date End Date Taking? Authorizing Provider  cloNIDine (CATAPRES) 0.3 MG tablet Take 0.3 mg by mouth every morning.   Yes Historical Provider, MD  lisinopril (PRINIVIL,ZESTRIL) 20 MG tablet Take 20 mg by mouth every morning.   Yes Historical Provider, MD  metoprolol (LOPRESSOR) 50 MG tablet Take 50 mg by mouth 2 (two) times daily.   Yes Historical Provider, MD  ranitidine (ZANTAC) 75 MG tablet Take 75 mg by mouth daily as needed for heartburn.    Yes Historical Provider, MD  traMADol (ULTRAM) 50 MG tablet Take 100 mg by mouth every 6 (six) hours as needed for moderate pain.   Yes Historical Provider, MD   BP 137/92  Pulse 64  Temp(Src) 98.5 F (36.9 C) (Oral)  Resp 16  SpO2 96% Physical Exam  Nursing note and vitals reviewed. Constitutional: He is oriented to person, place, and time. He appears well-developed and well-nourished. No distress.  Obese. Unkempt.  HENT:  Head: Normocephalic  and atraumatic.  Mouth/Throat: Oropharynx is clear and moist.  Eyes: Conjunctivae are normal.  Neck: Normal range of motion. Neck supple.  Cardiovascular: Normal rate, regular rhythm, normal heart sounds and intact distal pulses.   +2 radial pulse on left. Capillary refill less than 3 seconds.  Pulmonary/Chest: Effort normal and breath sounds normal.  Musculoskeletal: Normal range of motion. He exhibits no edema.  Full flexion and extension of left wrist, full pulmonary deviation. Full range of motion of left hand. Full flexion and extension of hands. No evidence of tendon  disruption.  Neurological: He is alert and oriented to person, place, and time.  Sensation intact.  Skin: Skin is warm and dry. He is not diaphoretic.  3 linear horizontal lacerations, 3 cm, 5 cm and 6 cm in length to the anterior lateral left forearm, no active bleeding.  Psychiatric: He has a normal mood and affect. His behavior is normal.    ED Course  Procedures (including critical care time) LACERATION REPAIR Performed by: Johnnette GourdAlbert, Nechama Escutia, Vicki MalletJessica Stevenson PA student Authorized by: Johnnette GourdAlbert, Kenna Seward Consent: Verbal consent obtained. Risks and benefits: risks, benefits and alternatives were discussed Consent given by: patient Patient identity confirmed: provided demographic data Prepped and Draped in normal sterile fashion Wound explored  Laceration Location: left forearm  Laceration Length: 3 cm, 5 cm and 6 cm  No Foreign Bodies seen or palpated  Anesthesia: local infiltration  Local anesthetic: lidocaine 2% with epinephrine  Anesthetic total: 12 ml  Irrigation method: syringe Amount of cleaning: standard  Skin closure: 4-0 prolene  Number of sutures: 24  Technique: simple interrupted  Patient tolerance: Patient tolerated the procedure well with no immediate complications.  Labs Review Labs Reviewed  COMPREHENSIVE METABOLIC PANEL - Abnormal; Notable for the following:    Sodium 135 (*)    Glucose, Bld 119 (*)    All other components within normal limits  URINE RAPID DRUG SCREEN (HOSP PERFORMED) - Abnormal; Notable for the following:    Benzodiazepines POSITIVE (*)    All other components within normal limits  SALICYLATE LEVEL - Abnormal; Notable for the following:    Salicylate Lvl <2.0 (*)    All other components within normal limits  CBC  ETHANOL  ACETAMINOPHEN LEVEL    Imaging Review No results found.   EKG Interpretation None      MDM   Final diagnoses:  Forearm laceration, left, initial encounter  Depression   Patient presenting with  self-inflicted lacerations to his left forearm. Neurovascularly intact. No evidence of tendon disruption. Nontoxic appearing and in no apparent distress. Lacerations repaired. TTS consult appreciated. Inpatient treatment recommended. Awaiting placement. Medically cleared.  Trevor MaceRobyn M Albert, PA-C 10/01/13 1410
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
## 2013-09-28 NOTE — BH Assessment (Signed)
Assessment Note  Travis [name] is an 48 y.o. male. Patient presented to the ED via EMS initated by the patient for medical complications and lacerations to the forearm.  Patient is currently denying SI/HI, hallucinations, and hx other self-injurious behaviors.  Patient reports that he had an argument with his mother today over some of his past behaviors and he used a knife/blade to make 3 cuts to his forearm.  Patient reports that the cuts were to scare the mother into leaving him alone and stop arguing.  Patient denies a history of self-injurious behaviors or only 1 inpatient hospitalization for detox.    CSW ran patient with Karleen Hampshire, [place] who recommends inpatient hospitalization for safety and stabilization.    Axis I: Mood Disorder NOS Axis II: Deferred Axis III:  Past Medical History  Diagnosis Date  . Hypertension   . Anxiety   . Tachycardia - pulse   . Degenerative disk disease   . Spinal stenosis   . Degenerative disk disease   . Tobacco abuse   . Obesity   . Polysubstance abuse   . Chest pain     Hospital, March, 2014, negative enzymes, patient refused in-hospital  stress test, patient canceled outpatient stress test   Axis IV: housing problems, other psychosocial or environmental problems, problems related to social environment and problems with primary support group Axis V: 41-50 serious symptoms  Past Medical History:  Past Medical History  Diagnosis Date  . Hypertension   . Anxiety   . Tachycardia - pulse   . Degenerative disk disease   . Spinal stenosis   . Degenerative disk disease   . Tobacco abuse   . Obesity   . Polysubstance abuse   . Chest pain     Hospital, March, 2014, negative enzymes, patient refused in-hospital  stress test, patient canceled outpatient stress test    History reviewed. No pertinent past surgical history.  Family History:  Family History  Problem Relation Age of Onset  . Stroke Mother   . Hypertension Mother   . Stroke Father   .  Hypertension Father   . Dementia Father   . Diabetes Father   . Heart disease Father   . Cancer Sister     brain  . Heart disease Brother     Social History:  reports that he has been smoking Cigarettes.  He has been smoking about 0.25 packs per day. He does not have any smokeless tobacco history on file. He reports that he does not drink alcohol or use illicit drugs.  Additional Social History:     CIWA: CIWA-Ar BP: 112/72 mmHg Pulse Rate: 66 COWS:    Allergies:  Allergies  Allergen Reactions  . Blueberry Flavor Hives  . Other Other (See Comments)    Muscle relaxant Robaxin causes rapid heart rate   . Penicillins Hives, Itching and Other (See Comments)    Hallucinations.  . Robaxin [Methocarbamol] Palpitations  . Toradol [Ketorolac Tromethamine] Hives and Other (See Comments)    Headache    Home Medications:  (Not in a hospital admission)  OB/GYN Status:  No LMP for male patient.  General Assessment Data Location of Assessment: [facility] ACT Assessment: Yes Is this a Tele or Face-to-Face Assessment?: Face-to-Face Is this an Initial Assessment or a Re-assessment for this encounter?: Initial Assessment Living Arrangements: Parent Can pt return to current living arrangement?: Yes Admission Status: Voluntary Is patient capable of signing voluntary admission?: Yes Transfer from: Home Referral Source: Self/Family/Friend  Medical Screening  Exam [facility] Walk-in ONLY) [facility]: [facility] Living Arrangements: Parent Name of Psychiatrist: none Name of Therapist: none  Education Status Is patient currently in school?: No  Risk to self with the past 6 months Suicidal Ideation: No Suicidal Intent: No (Pt denies) Is patient at risk for suicide?:  (pt denies) Suicidal Plan?: No Access to Means: Yes Specify Access to Suicidal Means: knives, razor blades Previous Attempts/Gestures: No Intentional Self Injurious Behavior: Cutting Comment - Self  Injurious Behavior: cutting Family Suicide History: No Recent stressful life event(s): Conflict (Comment) Persecutory voices/beliefs?: No Depression: Yes Depression Symptoms: Tearfulness;Feeling angry/irritable Substance abuse history and/or treatment for substance abuse?: No  Risk to Others within the past 6 months Homicidal Ideation: No-Not Currently/Within Last 6 Months Thoughts of Harm to Others: No-Not Currently Present/Within Last 6 Months Current Homicidal Intent: No-Not Currently/Within Last 6 Months Assessment of Violence: None Noted  Psychosis Hallucinations: None noted Delusions: None noted  Mental Status Report Appear/Hygiene: In hospital gown;Disheveled Eye Contact: Good Motor Activity: Freedom of movement Speech: Logical/coherent Level of Consciousness: Alert Mood: Depressed Affect: Flat Anxiety Level: None Thought Processes: Coherent Judgement: Unimpaired Orientation: Person;Place;Time;Situation Obsessive Compulsive Thoughts/Behaviors: None  Cognitive Functioning Concentration: Fair Memory: Remote Intact;Recent Intact IQ: Average Insight: Good Impulse Control: Fair Appetite: Fair  ADLScreening [facility] Assessment Services) Patient's cognitive ability adequate to safely complete daily activities?: Yes Patient able to express need for assistance with ADLs?: Yes Independently performs ADLs?: Yes (appropriate for developmental age)  Prior Inpatient Therapy Prior Inpatient Therapy: Yes Prior Therapy Dates: 2013 [facility] Reason for Treatment: SA  Prior Outpatient Therapy Prior Outpatient Therapy: Yes Prior Therapy Dates: 2015 Prior Therapy Facilty/Provider(s): [facility] Reason for Treatment: Depression  ADL Screening (condition at time of admission) Patient's cognitive ability adequate to safely complete daily activities?: Yes Patient able to express need for assistance with ADLs?: Yes Independently performs ADLs?: Yes  (appropriate for developmental age)         Values / Beliefs Cultural Requests During Hospitalization: None Spiritual Requests During Hospitalization: None        Additional Information 1:1 In Past 12 Months?: No CIRT Risk: No Elopement Risk: No Does patient have medical clearance?: Yes     Disposition:  Disposition Initial Assessment Completed for this Encounter: Yes Disposition of Patient: Inpatient treatment program Type of inpatient treatment program: Adult  On Site Evaluation by:   Reviewed with Physician:    Maryelizabeth Rowanorbett, Noel Henandez A 09/28/2013 9:51 PM

## 2013-09-28 NOTE — ED Notes (Signed)
Per EMS, Pt, from home, presents after cutting L forearm/wrist.  Pt reports he had an altercation w/ his mother, got mad, and cut his arm.  Denies SI/HI/Hallucinations.  Hx of anxiety.  Pt is adamant that he was not trying to kill himself.  He was trying to release his anger.

## 2013-09-28 NOTE — ED Notes (Signed)
Bed [facility] VanKelly, [facility] and Dr. Criss AlvineGoldston informed of transfere.

## 2013-09-29 ENCOUNTER — Encounter (HOSPITAL_COMMUNITY): Payer: Self-pay | Admitting: Emergency Medicine

## 2013-09-29 ENCOUNTER — Inpatient Hospital Stay (HOSPITAL_COMMUNITY)
Admission: EM | Admit: 2013-09-29 | Discharge: 2013-09-30 | DRG: 885 | Disposition: A | Discharge status: Home / Self Care | Payer: No Typology Code available for payment source | Source: Intra-hospital | Attending: Psychiatry | Admitting: Psychiatry

## 2013-09-29 DIAGNOSIS — I10 Essential (primary) hypertension: Secondary | ICD-10-CM | POA: Diagnosis present

## 2013-09-29 DIAGNOSIS — F17200 Nicotine dependence, unspecified, uncomplicated: Secondary | ICD-10-CM | POA: Diagnosis present

## 2013-09-29 DIAGNOSIS — F1910 Other psychoactive substance abuse, uncomplicated: Secondary | ICD-10-CM

## 2013-09-29 DIAGNOSIS — F332 Major depressive disorder, recurrent severe without psychotic features: Principal | ICD-10-CM | POA: Diagnosis present

## 2013-09-29 DIAGNOSIS — Z8249 Family history of ischemic heart disease and other diseases of the circulatory system: Secondary | ICD-10-CM | POA: Diagnosis not present

## 2013-09-29 DIAGNOSIS — Z823 Family history of stroke: Secondary | ICD-10-CM

## 2013-09-29 DIAGNOSIS — Z833 Family history of diabetes mellitus: Secondary | ICD-10-CM | POA: Diagnosis not present

## 2013-09-29 DIAGNOSIS — F410 Panic disorder [episodic paroxysmal anxiety] without agoraphobia: Secondary | ICD-10-CM | POA: Diagnosis present

## 2013-09-29 DIAGNOSIS — F411 Generalized anxiety disorder: Secondary | ICD-10-CM | POA: Diagnosis present

## 2013-09-29 MED ORDER — METOPROLOL TARTRATE 50 MG PO TABS
50.0000 mg | ORAL_TABLET | Freq: Two times a day (BID) | ORAL | Status: DC
  Administered 2013-09-29 – 2013-09-30 (×3): 50 mg via ORAL
  Filled 2013-09-29 (×8): qty 1

## 2013-09-29 MED ORDER — TRAMADOL HCL 50 MG PO TABS
100.0000 mg | ORAL_TABLET | Freq: Four times a day (QID) | ORAL | Status: DC | PRN
  Administered 2013-09-29 – 2013-09-30 (×6): 100 mg via ORAL
  Filled 2013-09-29 (×6): qty 2

## 2013-09-29 MED ORDER — CLONIDINE HCL 0.3 MG PO TABS
0.3000 mg | ORAL_TABLET | ORAL | Status: DC
  Administered 2013-09-29 – 2013-09-30 (×2): 0.3 mg via ORAL
  Filled 2013-09-29 (×2): qty 1
  Filled 2013-09-29: qty 3
  Filled 2013-09-29 (×2): qty 1
  Filled 2013-09-29: qty 3

## 2013-09-29 MED ORDER — NICOTINE 21 MG/24HR TD PT24
MEDICATED_PATCH | TRANSDERMAL | Status: AC
  Filled 2013-09-29: qty 1

## 2013-09-29 MED ORDER — LISINOPRIL 20 MG PO TABS
20.0000 mg | ORAL_TABLET | Freq: Every day | ORAL | Status: DC
  Administered 2013-09-29 – 2013-09-30 (×2): 20 mg via ORAL
  Filled 2013-09-29 (×5): qty 1

## 2013-09-29 MED ORDER — SODIUM CHLORIDE 0.9 % IR SOLN
1.0000 "application " | Status: DC | PRN
  Filled 2013-09-29: qty 1000

## 2013-09-29 MED ORDER — HYDROXYZINE HCL 50 MG PO TABS
50.0000 mg | ORAL_TABLET | Freq: Every evening | ORAL | Status: DC | PRN
  Administered 2013-09-29 – 2013-09-30 (×2): 50 mg via ORAL
  Filled 2013-09-29 (×8): qty 1

## 2013-09-29 MED ORDER — ACETAMINOPHEN 325 MG PO TABS: 650.0000 mg | ORAL_TABLET | Freq: Four times a day (QID) | ORAL | Status: DC | PRN

## 2013-09-29 MED ORDER — LISINOPRIL 20 MG PO TABS
20.0000 mg | ORAL_TABLET | Freq: Every morning | ORAL | Status: DC
  Filled 2013-09-29: qty 1

## 2013-09-29 MED ORDER — MAGNESIUM HYDROXIDE 400 MG/5ML PO SUSP: 30.0000 mL | Freq: Every day | ORAL | Status: DC | PRN

## 2013-09-29 MED ORDER — HYDROXYZINE HCL 25 MG PO TABS
25.0000 mg | ORAL_TABLET | Freq: Four times a day (QID) | ORAL | Status: DC | PRN
  Administered 2013-09-29: 25 mg via ORAL
  Filled 2013-09-29: qty 1

## 2013-09-29 MED ORDER — NICOTINE 21 MG/24HR TD PT24
21.0000 mg | MEDICATED_PATCH | Freq: Every day | TRANSDERMAL | Status: DC
  Administered 2013-09-29 – 2013-09-30 (×2): 21 mg via TRANSDERMAL
  Filled 2013-09-29 (×4): qty 1

## 2013-09-29 MED ORDER — ALUM & MAG HYDROXIDE-SIMETH 200-200-20 MG/5ML PO SUSP: 30.0000 mL | ORAL | Status: DC | PRN

## 2013-09-29 MED ORDER — FLUOXETINE HCL 10 MG PO CAPS
10.0000 mg | ORAL_CAPSULE | Freq: Every day | ORAL | Status: DC
  Administered 2013-09-30: 10 mg via ORAL
  Filled 2013-09-29: qty 14
  Filled 2013-09-29 (×2): qty 1
  Filled 2013-09-29: qty 14

## 2013-09-29 MED ORDER — TRAZODONE HCL 50 MG PO TABS
50.0000 mg | ORAL_TABLET | Freq: Every evening | ORAL | Status: DC | PRN
  Filled 2013-09-29 (×4): qty 1

## 2013-09-29 MED ORDER — FAMOTIDINE 20 MG PO TABS
20.0000 mg | ORAL_TABLET | Freq: Two times a day (BID) | ORAL | Status: DC
Start: 2013-09-29 — End: 2013-09-30
  Administered 2013-09-29 – 2013-09-30 (×3): 20 mg via ORAL
  Filled 2013-09-29 (×8): qty 1

## 2013-09-29 MED ORDER — CLINDAMYCIN HCL 300 MG PO CAPS
300.0000 mg | ORAL_CAPSULE | Freq: Three times a day (TID) | ORAL | Status: DC
  Administered 2013-09-30 (×2): 300 mg via ORAL
  Filled 2013-09-29 (×4): qty 19
  Filled 2013-09-29: qty 1
  Filled 2013-09-29: qty 2
  Filled 2013-09-29: qty 1
  Filled 2013-09-29: qty 19
  Filled 2013-09-29 (×3): qty 1
  Filled 2013-09-29: qty 19

## 2013-09-29 MED ORDER — LOPERAMIDE HCL 2 MG PO CAPS
2.0000 mg | ORAL_CAPSULE | ORAL | Status: DC | PRN
  Administered 2013-09-29: 2 mg via ORAL
  Filled 2013-09-29: qty 1

## 2013-09-29 MED ORDER — BACITRACIN ZINC 500 UNIT/GM EX OINT
TOPICAL_OINTMENT | Freq: Two times a day (BID) | CUTANEOUS | Status: DC
  Administered 2013-09-30: 09:00:00 via TOPICAL
  Filled 2013-09-29: qty 28.35

## 2013-09-29 NOTE — Progress Notes (Signed)
Patient ID: Travis [name], male   DOB: 1965-08-12, 48 y.o.   MRN: [id number]   D: Writer introduced herself to the pt. Within 2 mins pt asked for a cane, explained that his bandage needed changing, and spoke of diarrhea.  Writer acknowledged pt's complaints and concerns, then asked about his day. Pt stated he'd had a "pretty good day".  Stated, "everything that happened and what went on. I wasn't trying to kill myself, I was trying to scare my momma".  Stated that the doctor wants him on prozac, but he's not depressed. Also wanted wanted to know why his ativan was canceled.   A: Writer received an order for vistaril. Encouraged pt to follow doctor's orders. 15 min checks continued for safety.   R: Pt remains safe.

## 2013-09-29 NOTE — H&P (Signed)
Psychiatric Admission Assessment Adult  Patient Identification:  Travis [name]  Date of Evaluation:  09/29/2013  Chief Complaint:  Depressive Disorder  History of Present Illness: Travis [name] is 48 years old, Caucasian male. He reports, "The ambulance took me to the hospital yesterday. Me and my mom had an argument. She got mad and wanted to leave. She is 46 years old, I did not want her to drive, not even so if she was upset. I used a knife and cut my arm just to scare and aggravate my mother. This is not an intent or attempt to kill myself.  I don't want to die. I did not know that the knife was that sharp. I have been having panic attacks for many years. I'm not here for drugs and or alcohol detox. I don't mess with those. 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  Elements:  Location:  Major depression. Quality:  Anger, irritabilty, agitation. Severity:  Severe. Timing:  Started yesterday. Duration:  Started yesterday. Context:  "Had an argument with my mother, got irritated, agitated, I cut my left arm with a knife just to aggravate my mother"..  Associated Signs/Synptoms:  Depression Symptoms:  Denies feeling or being depressed.  (Hypo) Manic Symptoms:  Impulsivity,  Anxiety Symptoms:  Worried about being in this hospital.  Psychotic Symptoms:  Denies  PTSD Symptoms: NA  Psychiatric Specialty Exam: Physical Exam  Constitutional: He is oriented to person, place, and time. He appears well-developed.  HENT:  Head: Normocephalic.  Eyes: Pupils are equal, round, and reactive to light.  Neck: Normal range of motion.  Cardiovascular: Normal rate.   Respiratory: Effort normal.  GI: Soft.  Musculoskeletal: Normal range of motion.       Right shoulder: He exhibits laceration.       Arms: Self inflicted laceration to left arm  Neurological: He is alert and oriented to person, place,  and time.  Skin: Skin is warm and dry.  Psychiatric: His speech is normal and behavior is normal. Thought content normal. His mood appears not anxious. His affect is not angry, not blunt, not labile and not inappropriate. Cognition and memory are normal. He expresses impulsivity. He exhibits a depressed mood.    Review of Systems  Constitutional: Negative.   HENT: Negative.   Eyes: Negative.   Respiratory: Negative.   Cardiovascular: Negative.   Gastrointestinal: Negative.   Genitourinary: Negative.   Musculoskeletal: Positive for myalgias.  Skin: Negative.   Neurological: Negative.   Endo/Heme/Allergies: Negative.   Psychiatric/Behavioral: Positive for depression. Negative for suicidal ideas, hallucinations, memory loss and substance abuse. The patient is nervous/anxious and has insomnia.     Blood pressure 121/80, pulse 65, temperature 97.7 F (36.5 C), temperature source Oral, resp. rate 20, height 5' 11"  (1.803 m), weight 131.09 kg (289 lb).Body mass index is 40.33 kg/(m^2).  General Appearance: Casual, Disheveled and obese  Eye Contact::  Fair  Speech:  Clear and Coherent  Volume:  Normal  Mood:  Denies feeling or being depressed  Affect:  Flat  Thought Process:  Coherent and Intact  Orientation:  Full (Time, Place, and Person)  Thought Content:  Rumination  Suicidal Thoughts:  No  Homicidal Thoughts:  No  Memory:  Immediate;   Good Recent;   Good Remote;   Fair  Judgement:  Impaired  Insight:  Lacking  Psychomotor Activity:  Normal  Concentration:  Fair  Recall:  Travis [name] of Knowledge:Poor  Language: Fair  Akathisia:  No  Handed:  Right  AIMS (if indicated):     Assets:  Desire for Improvement  Sleep:      Musculoskeletal: Strength & Muscle Tone: within normal limits Gait & Station: normal Patient leans: N/A  Past Psychiatric History: Diagnosis: MDD (major depressive disorder), recurrent episode, severe  Hospitalizations: [facility] adult unit  Outpatient Care:  [facility] with Dr. Lovena Le  Substance Abuse Care: Has been through Methadone clinic in the past.  Self-Mutilation: Yes, self inflicted laceration to left arm with a knife  Suicidal Attempts: "yes by inflicting laceration to left arm  Violent Behaviors:  Purposely inflicted a laceration to left arm   Past Medical History:   Past Medical History  Diagnosis Date  . Hypertension   . Anxiety   . Tachycardia - pulse   . Degenerative disk disease   . Spinal stenosis   . Degenerative disk disease   . Tobacco abuse   . Obesity   . Polysubstance abuse   . Chest pain     Hospital, March, 2014, negative enzymes, patient refused in-hospital  stress test, patient canceled outpatient stress test   Cardiac History:  HTN, Tachycardia  Allergies:   Allergies  Allergen Reactions  . Blueberry Flavor Hives  . Other Other (See Comments)    Muscle relaxant Robaxin causes rapid heart rate   . Penicillins Hives, Itching and Other (See Comments)    Hallucinations.  . Robaxin [Methocarbamol] Palpitations  . Toradol [Ketorolac Tromethamine] Hives and Other (See Comments)    Headache   PTA Medications: Prescriptions prior to admission  Medication Sig Dispense Refill  . cloNIDine (CATAPRES) 0.3 MG tablet Take 0.3 mg by mouth every morning.      Marland Kitchen lisinopril (PRINIVIL,ZESTRIL) 20 MG tablet Take 20 mg by mouth every morning.      . ranitidine (ZANTAC) 75 MG tablet Take 75 mg by mouth daily as needed for heartburn.       . traMADol (ULTRAM) 50 MG tablet Take 100 mg by mouth every 6 (six) hours as needed for moderate pain.      . metoprolol (LOPRESSOR) 50 MG tablet Take 50 mg by mouth 2 (two) times daily.       Previous Psychotropic Medications: Medication/Dose  See medication lists               Substance Abuse History in the last 12 months:  Yes.    Consequences of Substance Abuse: Medical Consequences:  Liver damage, Possible death by overdose Legal Consequences:  Arrests, jail time, Loss of  driving privilege. Family Consequences:  Family discord, divorce and or separation.  Social History:  reports that he has been smoking Cigarettes.  He has been smoking about 2.00 packs per day. He has never used smokeless tobacco. He reports that he does not drink alcohol or use illicit drugs. Additional Social History: History of alcohol / drug use?: Yes (over use of prescribed Methodone several years ago)  Current Place of Residence: [place], [place] of Birth:  [place], [place]  Family Members: "My mother"  Marital Status:  Divorced  Children: 0  Sons:0  Daughters: 0  Relationships: Divorced  Education:  GED  Educational Problems/Performance: Obtained GED  Religious Beliefs/Practices: NA  History of Abuse (Emotional/Phsycial/Sexual): denies  Occupational Experiences: Medical laboratory scientific officer History:  None.  Legal History: Denies any pending legal charges  Hobbies/Interests: NA  Family History:   Family History  Problem Relation Age of Onset  . Stroke Mother   . Hypertension Mother   . Stroke Father   . Hypertension Father   . Dementia Father   . Diabetes Father   . Heart disease Father   . Cancer Sister     brain  . Heart disease Brother     Results for orders placed during the hospital encounter of 09/28/13 (from the past 72 hour(s))  CBC     Status: None   Collection Time    09/28/13  3:15 PM      Result Value Ref Range   WBC 8.3  4.0 - 10.5 K/uL   RBC 4.94  4.22 - 5.81 MIL/uL   Hemoglobin 14.4  13.0 - 17.0 g/dL   HCT 42.4  39.0 - 52.0 %   MCV 85.8  78.0 - 100.0 fL   MCH 29.1  26.0 - 34.0 pg   MCHC 34.0  30.0 - 36.0 g/dL   RDW 13.1  11.5 - 15.5 %   Platelets 180  150 - 400 K/uL  COMPREHENSIVE METABOLIC PANEL     Status: Abnormal   Collection Time    09/28/13  3:15 PM      Result Value Ref Range   Sodium 135 (*) 137 - 147 mEq/L   Potassium 4.5  3.7 - 5.3 mEq/L   Chloride 97  96 - 112 mEq/L   CO2 28  19 - 32 mEq/L   Glucose, Bld 119 (*) 70  - 99 mg/dL   BUN 6  6 - 23 mg/dL   Creatinine, Ser 0.99  0.50 - 1.35 mg/dL   Calcium 9.3  8.4 - 10.5 mg/dL   Total Protein 7.6  6.0 - 8.3 g/dL   Albumin 3.6  3.5 - 5.2 g/dL   AST 23  0 - 37 U/L   ALT 20  0 - 53 U/L   Alkaline Phosphatase 79  39 - 117 U/L   Total Bilirubin 0.5  0.3 - 1.2 mg/dL   GFR calc non Af Amer >90  >90 mL/min   GFR calc Af Amer >90  >90 mL/min   Comment: (NOTE)     The eGFR has been calculated using the CKD EPI equation.     This calculation has not been validated in all clinical situations.     eGFR's persistently <90 mL/min signify possible Chronic Kidney     Disease.   Anion gap 10  5 - 15  ETHANOL     Status: None   Collection Time    09/28/13  3:15 PM      Result Value Ref Range   Alcohol, Ethyl (B) <11  0 - 11 mg/dL   Comment:            LOWEST DETECTABLE LIMIT FOR     SERUM ALCOHOL IS 11 mg/dL     FOR MEDICAL PURPOSES ONLY  ACETAMINOPHEN LEVEL     Status: None   Collection Time    09/28/13  3:15 PM      Result Value Ref Range   Acetaminophen (Tylenol), Serum <15.0  10 - 30 ug/mL   Comment:            THERAPEUTIC CONCENTRATIONS VARY     SIGNIFICANTLY. A RANGE OF 10-30     ug/mL MAY BE AN EFFECTIVE     CONCENTRATION FOR MANY PATIENTS.     HOWEVER, SOME ARE BEST TREATED     AT CONCENTRATIONS OUTSIDE THIS  RANGE.     ACETAMINOPHEN CONCENTRATIONS     >150 ug/mL AT 4 HOURS AFTER     INGESTION AND >50 ug/mL AT 12     HOURS AFTER INGESTION ARE     OFTEN ASSOCIATED WITH TOXIC     REACTIONS.  SALICYLATE LEVEL     Status: Abnormal   Collection Time    09/28/13  3:15 PM      Result Value Ref Range   Salicylate Lvl <4.5 (*) 2.8 - 20.0 mg/dL  URINE RAPID DRUG SCREEN (HOSP PERFORMED)     Status: Abnormal   Collection Time    09/28/13  7:36 PM      Result Value Ref Range   Opiates NONE DETECTED  NONE DETECTED   Cocaine NONE DETECTED  NONE DETECTED   Benzodiazepines POSITIVE (*) NONE DETECTED   Amphetamines NONE DETECTED  NONE DETECTED    Tetrahydrocannabinol NONE DETECTED  NONE DETECTED   Barbiturates NONE DETECTED  NONE DETECTED   Comment:            DRUG SCREEN FOR MEDICAL PURPOSES     ONLY.  IF CONFIRMATION IS NEEDED     FOR ANY PURPOSE, NOTIFY LAB     WITHIN 5 DAYS.                LOWEST DETECTABLE LIMITS     FOR URINE DRUG SCREEN     Drug Class       Cutoff (ng/mL)     Amphetamine      1000     Barbiturate      200     Benzodiazepine   625     Tricyclics       638     Opiates          300     Cocaine          300     THC              50   Psychological Evaluations:  Assessment:   DSM5: Schizophrenia Disorders:  NA Obsessive-Compulsive Disorders:  NA Trauma-Stressor Disorders:  NA Substance/Addictive Disorders:  Hx Polysubstance dependence Depressive Disorders:  MDD (major depressive disorder), recurrent episode, severe  AXIS I:  MDD (major depressive disorder), recurrent episode, severe, Hx. Polysubstance dependence AXIS II:  Deferred AXIS III:   Past Medical History  Diagnosis Date  . Hypertension   . Anxiety   . Tachycardia - pulse   . Degenerative disk disease   . Spinal stenosis   . Degenerative disk disease   . Tobacco abuse   . Obesity   . Polysubstance abuse   . Chest pain     Hospital, March, 2014, negative enzymes, patient refused in-hospital  stress test, patient canceled outpatient stress test   AXIS IV:  other psychosocial or environmental problems and familial stressors AXIS V:  11-20 some danger of hurting self or others possible OR occasionally fails to maintain minimal personal hygiene OR gross impairment in communication  Treatment Plan/Recommendations: 1. Admit for crisis management and stabilization, estimated length of stay 3-5 days.  2. Medication management to reduce current symptoms to base line and improve the patient's overall level of functioning, continue current treatment already in progress. 3. Treat health problems as indicated.  4. Develop treatment plan to  decrease risk of relapse upon discharge and the need for readmission.  5. Psycho-social education regarding relapse prevention and self care.  6. Health care follow up as needed for  medical problems.  7. Review, reconcile, and reinstate any pertinent home medications for other health issues where appropriate. 8. Call for consults with hospitalist for any additional specialty patient care services as needed.  Treatment Plan Summary: Daily contact with patient to assess and evaluate symptoms and progress in treatment Medication management  Current Medications:  Current Facility-Administered Medications  Medication Dose Route Frequency Provider Last Rate Last Dose  . acetaminophen (TYLENOL) tablet 650 mg  650 mg Oral Q6H PRN Laverle Hobby, PA-C      . alum & mag hydroxide-simeth (MAALOX/MYLANTA) 200-200-20 MG/5ML suspension 30 mL  30 mL Oral Q4H PRN Laverle Hobby, PA-C      . cloNIDine (CATAPRES) tablet 0.3 mg  0.3 mg Oral BH-q7a Laverle Hobby, PA-C   0.3 mg at 09/29/13 0569  . famotidine (PEPCID) tablet 20 mg  20 mg Oral BID Laverle Hobby, PA-C   20 mg at 09/29/13 7948  . hydrOXYzine (ATARAX/VISTARIL) tablet 25 mg  25 mg Oral Q6H PRN Laverle Hobby, PA-C   25 mg at 09/29/13 0152  . lisinopril (PRINIVIL,ZESTRIL) tablet 20 mg  20 mg Oral Daily Nicholaus Bloom, MD   20 mg at 09/29/13 0803  . magnesium hydroxide (MILK OF MAGNESIA) suspension 30 mL  30 mL Oral Daily PRN Laverle Hobby, PA-C      . metoprolol (LOPRESSOR) tablet 50 mg  50 mg Oral BID Laverle Hobby, PA-C   50 mg at 09/29/13 0803  . traMADol (ULTRAM) tablet 100 mg  100 mg Oral Q6H PRN Laverle Hobby, PA-C   100 mg at 09/29/13 0165  . traZODone (DESYREL) tablet 50 mg  50 mg Oral QHS,MR X 1 Laverle Hobby, PA-C        Observation Level/Precautions:  15 minute checks  Laboratory:  Per ED  Psychotherapy: Group sessions, AA/NA meetings    Medications:  See medication lists  Consultations:  As needed  Discharge Concerns:   Safety  Estimated LOS: 2-4 days   Other:     I certify that inpatient services furnished can reasonably be expected to improve the patient's condition.   Lindell Spar I, PMHNP-BC 7/29/201511:25 AM  I have reviewed NP's Note, assessement, diagnosis and plan, and agree. I have also met with patient and completed suicide risk assessment. Patient is a 48 year old man who has a history of depression and anxiety. Of note, he states he has no prior history of suicide attempts. He recently cut his arm, seriously enough to need sutures, which he states was not truly a suicide attempt, but an attempt to get his mother's attention after an argument. He presents depressed, slightly anxious, but not suicidal at this time and contracting for safety on the unit at present. No psychotic symptoms. Of note, patient had a recent (+) guaiac test- he states he has an appt with a GI specialist in a couple of weeks and he is currently on Pepcid. We discussed antidepressant treatment options, and agrees to Prozac . We discussed  Side effect profile.   Neita Garnet, MD

## 2013-09-29 NOTE — Progress Notes (Signed)
Dressing applied on self inflicted wound on lt forearm. Wound was dry, intact, and no significant swelling. Wound cleaned with saline solution bacitracin ointment, Telfa, and kerllix dressing applied.

## 2013-09-29 NOTE — Progress Notes (Signed)
Pt attended NA group this evening.  

## 2013-09-29 NOTE — Progress Notes (Signed)
Patient ID: Travis [name], male   DOB: [birth year], 48 y.o.   MRN: [id number]  Admission Note:   Patient is a 48 yo male admitted voluntarily for depression and a self-inflicted cut to his left forearm which required several stitches. Pt states it was not a suicide attempt, but that he did it only to upset his mother and get back at her for nagging him about his past constantly. Pt states he is tired of having his past thrown up to him everyday. Pt lives with his mother and is currently awaiting a court date for his disability on August 11th. 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 Pt states he has not been on it for years now. Pt denies SI or plans to harm himself and verbally contracts for safety while on unit.

## 2013-09-29 NOTE — ED Notes (Signed)
Patient informed of admission to [facility] and patient verbalized understanding. Patient did not voice any concerns at this time. Voluntary admission paper and consent to treat form given to patient and Clinical research associatewriter explain paper work to patient. Patient verbalized understanding and signed forms. AVS provided and reviewed. Understanding verbalized. Patient denied SI/HI/AVH at this time.He offered no questions or concerns. Pt transported to [facility] by [employer] service for continuation of specialized care. He left in no acute distress. Escorted off the unit, belongings given to [employer] transport staff and directed to the exit.

## 2013-09-29 NOTE — Progress Notes (Signed)
Patient ID: Travis [name], male   DOB: 1965/06/24, 48 y.o.   MRN: [id number]  D: Patient pleasant on approach today. Reports that he got into an argument with mother and cut him self impulsively. Dressing dry and intact to lt lower arm. Patient reports that it was a "dumb" mistake. Adamantly denies having any present suicidal ideations. Reports ongoing issues with blood in stool and says has an appointment soon. Reports he use to be on strong pain medications for his chronic pain but he weaned himself off himself. Currently taking tramadol for pain.  A: Staff will monitor on q 15 minute checks, follow treatment plan, and give meds as ordered. R: Cooperative on the unit and taking meds as ordered.
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
## 2013-09-29 NOTE — Tx Team (Signed)
Initial Interdisciplinary Treatment Plan   PATIENT STRESSORS: Health problems Marital or family conflict   PROBLEM LIST: Problem List/Patient Goals Date to be addressed Date deferred Reason deferred Estimated date of resolution  Depression 09/29/2013     Suicide Risk 09/29/2013     Self Harm Risk 09/29/2013                                          DISCHARGE CRITERIA:  Improved stabilization in mood, thinking, and/or behavior Need for constant or close observation no longer present Reduction of life-threatening or endangering symptoms to within safe limits Verbal commitment to aftercare and medication compliance  PRELIMINARY DISCHARGE PLAN: Outpatient therapy Return to previous living arrangement  PATIENT/FAMIILY INVOLVEMENT: This treatment plan has been presented to and reviewed with the patient, Travis [name], and/or family member.  The patient and family have been given the opportunity to ask questions and make suggestions.  [name], [name] 09/29/2013, 1:58 AM

## 2013-09-29 NOTE — B[facility] Group Notes (Signed)
B[facility] LCSW Group Therapy  09/29/2013 3:05 PM  Type of Therapy:  Group Therapy  Participation Level:  Did Not Attend-pt called out of room by RN/did not return.   Smart, Teshawn Moan LCSWA  09/29/2013, 3:05 PM

## 2013-09-29 NOTE — BHH Suicide Risk Assessment (Signed)
   Nursing information obtained from:  Patient Demographic factors:  Male;Unemployed Current Mental Status:  NA Loss Factors:  Decline in physical health Historical Factors:  Impulsivity Risk Reduction Factors:  Living with another person, especially a relative Total Time spent with patient: 45 minutes  CLINICAL FACTORS:   Depression:   Impulsivity  Psychiatric Specialty Exam: Physical Exam  ROS  Blood pressure 121/80, pulse 65, temperature 97.7 F (36.5 C), temperature source Oral, resp. rate 20, height 5\' 11"  (1.803 m), weight 131.09 kg (289 lb).Body mass index is 40.33 kg/(m^2).  General Appearance: Fairly Groomed  Patent attorneyye Contact::  Good  Speech:  Slow  Volume:  Normal  Mood:  Depressed  Affect:  Constricted  Thought Process:  Linear  Orientation:  NA- fully alert  And attentive   Thought Content:  denies hallucinations, no delusions  Suicidal Thoughts:  No- denies any current any suicidal or homicidal ideations, contracts for safety at this time  Homicidal Thoughts:  No  Memory:  NA  Judgement:  Fair  Insight:  Fair  Psychomotor Activity:  Decreased  Concentration:  Good  Recall:  Good  Fund of Knowledge:Good  Language: Good  Akathisia:  No  Handed:  Right  AIMS (if indicated):     Assets:  Communication Skills Desire for Improvement Resilience  Sleep:      Musculoskeletal: Strength & Muscle Tone: within normal limits Gait & Station: normal Patient leans: N/A  COGNITIVE FEATURES THAT CONTRIBUTE TO RISK:  Closed-mindedness    SUICIDE RISK:   Moderate:  Frequent suicidal ideation with limited intensity, and duration, some specificity in terms of plans, no associated intent, good self-control, limited dysphoria/symptomatology, some risk factors present, and identifiable protective factors, including available and accessible social support.  PLAN OF CARE:Patient will be admitted to inpatient psychiatric unit for stabilization and safety. Will provide and encourage  milieu participation. Provide medication management and maked adjustments as needed.  Will follow daily.    I certify that inpatient services furnished can reasonably be expected to improve the patient's condition.  Veronnica Hennings 09/29/2013, 4:13 PM

## 2013-09-29 NOTE — [facility] Group Notes (Signed)
[facility] LCS[facility] Aftercare Discharge Planning Group Note   09/29/2013 10:36 AM  Participation Quality:  Appropriate   Mood/Affect:  Depressed and Flat  Depression Rating:  0  Anxiety Rating:  2-3  Thoughts of Suicide:  No Will you contract for safety?   NA  Current AVH:  No  Plan for Discharge/Comments:  Pt reports that he and his mom got into verbal altercation and he cut wrists "to spite my mom." Pt adamant that this was not sucide attempt but rther an attempt to scare his mother, which he reports remorse for at this time. PT denies SI/HI/AVH. PT states that he has multiple medical issues and feels depressed due to medical problems. Pt plans to return home with mom and stated that she is agreeable to his return. Pt stated that he plans to go [facility] and [place] [facility]. Pt reports no SA.  Transportation Means: family member   Supports: mother/sister/some family supports.   Smart, American FinancialHeather LCSWA

## 2013-09-30 LAB — GLUCOSE, CAPILLARY: GLUCOSE-CAPILLARY: 67 mg/dL — AB (ref 70–99)

## 2013-09-30 MED ORDER — LISINOPRIL 20 MG PO TABS: 20.0000 mg | ORAL_TABLET | Freq: Every morning | ORAL | Status: DC

## 2013-09-30 MED ORDER — METOPROLOL TARTRATE 50 MG PO TABS: 50.0000 mg | ORAL_TABLET | Freq: Two times a day (BID) | ORAL | Status: DC

## 2013-09-30 MED ORDER — CLONIDINE HCL 0.3 MG PO TABS: 0.3000 mg | ORAL_TABLET | ORAL | Status: DC

## 2013-09-30 MED ORDER — HYDROXYZINE HCL 50 MG PO TABS: 50.0000 mg | ORAL_TABLET | Freq: Every day | ORAL | Status: DC

## 2013-09-30 MED ORDER — RANITIDINE HCL 75 MG PO TABS: 75.0000 mg | ORAL_TABLET | Freq: Every day | ORAL | Status: DC | PRN

## 2013-09-30 MED ORDER — FLUOXETINE HCL 10 MG PO CAPS
10.0000 mg | ORAL_CAPSULE | Freq: Every day | ORAL | Status: DC
Start: 2013-09-30 — End: 2013-12-16

## 2013-09-30 MED ORDER — BACITRACIN ZINC 500 UNIT/GM EX OINT: TOPICAL_OINTMENT | Freq: Two times a day (BID) | CUTANEOUS | Status: DC

## 2013-09-30 MED ORDER — HYDROXYZINE HCL 25 MG PO TABS
25.0000 mg | ORAL_TABLET | Freq: Three times a day (TID) | ORAL | Status: DC | PRN
  Filled 2013-09-30: qty 42

## 2013-09-30 MED ORDER — TRAMADOL HCL 50 MG PO TABS: 100.0000 mg | ORAL_TABLET | Freq: Four times a day (QID) | ORAL | Status: DC | PRN

## 2013-09-30 MED ORDER — HYDROXYZINE HCL 25 MG PO TABS: ORAL_TABLET | ORAL | Status: DC

## 2013-09-30 MED ORDER — CLINDAMYCIN HCL 300 MG PO CAPS: 300.0000 mg | ORAL_CAPSULE | Freq: Three times a day (TID) | ORAL | Status: DC
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
## 2013-09-30 NOTE — Progress Notes (Signed)
Patient discharged with all belongings including his bottle of clonidine which he was concerned about. Patient denies SI/HI or psychosis. AVS reviewed by Roswell Minersonna Shimp RN. No further  Questions, Mother arrive to transport patient home.

## 2013-09-30 NOTE — B[facility] Suicide Risk Assessment (Addendum)
Demographic Factors:  48 year old man, divorced , lives with mother.  Total Time spent with patient: 30 minutes  Psychiatric Specialty Exam: Physical Exam  ROS  Blood pressure 131/93, pulse 74, temperature 97.3 F (36.3 C), temperature source Oral, resp. rate 18, height 5\' 11"  (1.803 m), weight 131.09 kg (289 lb).Body mass index is 40.33 kg/(m^2).  General Appearance: improving grooming  Eye Contact::  Good  Speech:  Normal Rate  Volume:  Normal  Mood:  reports his mood is improved   Affect:  Appropriate  Thought Process:  Goal Directed and Linear  Orientation:  NA- fully alert and attentive   Thought Content:  no hallucinations, no delusions  Suicidal Thoughts:  No- denies any suicidal or homicidal ideations  Homicidal Thoughts:  No  Memory:  NA  Judgement:  Fair  Insight:  Fair  Psychomotor Activity:  Normal  Concentration:  Negative  Recall:  Good  Fund of Knowledge:Good  Language: Good  Akathisia:  Negative  Handed:  Right  AIMS (if indicated):     Assets:  Desire for Improvement Resilience  Sleep:  Number of Hours: 4    Musculoskeletal: Strength & Muscle Tone: within normal limits- walks with cane due to chronic pain ( R leg)  Gait & Station: normal Patient leans: N/A   Mental Status Per Nursing Assessment::   On Admission:  NA  Current Mental Status by Physician: At this time patient is alert and attentive, cooperative, calm, mood is " better" , affect is more reactive, no thought disorder, no suicidal or homicidal ideations, denies hallucinations, no delusions- he is currently future oriented , and states for example that 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) and meet with his lawyer to review disability application  Regarding recent self cutting, states" I was really not trying to hurt myself, it was a stupid way of trying to get my mother to stay and not leave and have to drive"   Loss Factors: limited support network, unemployed    Historical Factors: history of depression  Risk Reduction Factors:   Sense of responsibility to family and Living with another person, especially a relative  Continued Clinical Symptoms:  At this time denies any depression, no suicidal or homicidal ideations, no psychotic symptoms  Cognitive Features That Contribute To Risk:  No gross cognitive deficits noted upon discharge    Suicide Risk:  Mild:  Suicidal ideation of limited frequency, intensity, duration, and specificity.  There are no identifiable plans, no associated intent, mild dysphoria and related symptoms, good self-control (both objective and subjective assessment), few other risk factors, and identifiable protective factors, including available and accessible social support.  Discharge Diagnoses:   AXIS I:  Depressive Disorder NOS, Substance Abuse  AXIS II:  Deferred AXIS III:   Past Medical History  Diagnosis Date  . Hypertension   . Anxiety   . Tachycardia - pulse   . Degenerative disk disease   . Spinal stenosis   . Degenerative disk disease   . Tobacco abuse   . Obesity   . Polysubstance abuse   . Chest pain     Hospital, March, 2014, negative enzymes, patient refused in-hospital  stress test, patient canceled outpatient stress test   AXIS IV:  economic problems, occupational problems and limited support network AXIS V:  51-60 moderate symptoms ( 60 upon discharge)   Plan Of Care/Follow-up recommendations:  Activity:  As tolerated Diet:  Heart Healthy, low sodium Tests:  NA Other:  See below  Is patient on multiple antipsychotic therapies at discharge:  No   Has Patient had three or more failed trials of antipsychotic monotherapy by history:  No  Recommended Plan for Multiple Antipsychotic Therapies: NA  Patient has requested discharge and as reviewed with Nursing Staff and NP, there are no current grounds for involuntary commitment, so will discharge as per patient's request.  Patient plans to  return home , and SW has discussed this with patient's mother, who reported concern that patient has in the past taken her xanax. Patient denies , states he has no intentions of doing so or of taking any BZDs. At this time tolerating Prozac well. Repeat fasting glucose WNL. Of note, patient is aware of recent (+) Guaiac and states he has an appt with a GI Specialist on the 4th of August.  Follow up with [facility]. (Walk in between 8am-9am Monday through Friday for hospital follow-up, medication management/assessment for therapy services. ) - Patient states he plans to go tomorrow. Follow up with [facility]. (Call office on day of discharge to schedule follow-up for medical medication management.)  Plans to return to hospital in one week for suture removal.   Lillieann Pavlich 09/30/2013, 2:06 PM

## 2013-09-30 NOTE — B[facility] Group Notes (Signed)
BHH LCSW Group Therapy  09/30/2013 2:42 PM  Type of Therapy:  Group Therapy  Participation Level:  Active  Participation Quality:  Attentive  Affect:  Appropriate  Cognitive:  Alert and Oriented  Insight:  Improving  Engagement in Therapy:  Engaged  Modes of Intervention:  Confrontation, Discussion, Education, Exploration, Problem-solving, Rapport Building, Socialization and Support  Summary of Progress/Problems:  Finding Balance in Life. Today's group focused on defining balance in one's own words, identifying things that can knock one off balance, and exploring healthy ways to maintain balance in life. Group members were asked to provide an example of a time when they felt off balance, describe how they handled that situation,and process healthier ways to regain balance in the future. Group members were asked to share the most important tool for maintaining balance that they learned while at [facility] and how they plan to apply this method after discharge. Fayrene FearingJames was attentive and engaged throughout today's therapy group. He shared that he struggles with balance due to issues with pain management and issues with family stress. Fayrene FearingJames shows progress in the group setting and improving insight AEB his ability to process how "working on communication with my mom, staying away from negative people, and taking my medication like is prescribed" will help him reestablish a sense of balance in life and avoid relapse. Fayrene FearingJames shared that he feels intense guilt concerning cutting his wrist for the purpose of upsetting his mother and stated that he must work on mending his relationship with her when he returns home.     Smart, Heather LCSWA  09/30/2013, 2:42 PM

## 2013-09-30 NOTE — Discharge Summary (Signed)
Physician Discharge Summary Note  Patient:  Travis [name] is an 48 y.o., male MRN:  [id number] DOB:  12/12/1965 Patient phone:  [phone] (home)  Patient address:   [address] [place] [address],  Total Time spent with patient: Greater than 30 minutes  Date of Admission:  09/29/2013 Date of Discharge: 09/30/13  Reason for Admission: Mood stabilization  Discharge Diagnoses: Active Problems:   MDD (major depressive disorder), recurrent episode, severe   Psychiatric Specialty Exam: Physical Exam  Psychiatric: His speech is normal and behavior is normal. Judgment and thought content normal. His mood appears not anxious. His affect is not angry, not blunt, not labile and not inappropriate. Cognition and memory are normal. He does not exhibit a depressed mood.    Review of Systems  Constitutional: Negative.   HENT: Negative.   Eyes: Negative.   Respiratory: Negative.   Cardiovascular: Negative.   Gastrointestinal: Negative.   Genitourinary: Negative.   Musculoskeletal: Negative.   Skin: Negative.   Neurological: Negative.   Endo/Heme/Allergies: Negative.   Psychiatric/Behavioral: Positive for depression (Stable) and substance abuse (Hx opioid dependence). Negative for suicidal ideas, hallucinations and memory loss. The patient is not nervous/anxious and does not have insomnia.     Blood pressure 131/93, pulse 74, temperature 97.3 F (36.3 C), temperature source Oral, resp. rate 18, height 5' 11"  (1.803 m), weight 131.09 kg (289 lb).Body mass index is 40.33 kg/(m^2).   General Appearance: improving grooming   Eye Contact:: Good   Speech: Normal Rate   Volume: Normal   Mood: reports his mood is improved   Affect: Appropriate   Thought Process: Goal Directed and Linear   Orientation: NA- fully alert and attentive   Thought Content: no hallucinations, no delusions   Suicidal Thoughts: No- denies any suicidal or homicidal ideations   Homicidal Thoughts: No    Memory: NA   Judgement: Fair   Insight: Fair   Psychomotor Activity: Normal   Concentration: Negative   Recall: Good   Fund of Knowledge:Good   Language: Good   Akathisia: Negative   Handed: Right   AIMS (if indicated):   Assets: Desire for Improvement  Resilience   Sleep: Number of Hours: 4    Past Psychiatric History: Diagnosis: MDD (major depressive disorder), recurrent episode, severe  Hospitalizations: [facility] adult unit  Outpatient Care: [facility]  Substance Abuse Care: [facility]  Self-Mutilation: "Yes, self cutter"  Suicidal Attempts: Denies, although cut himself with a knife as suicide gesture  Violent Behaviors: NA   Musculoskeletal: Strength & Muscle Tone: within normal limits Gait & Station: normal Patient leans: N/A  DSM5: Schizophrenia Disorders:  NA Obsessive-Compulsive Disorders:  NA Trauma-Stressor Disorders:  NA Substance/Addictive Disorders:  Hx Polysubstance dependence Depressive Disorders:  MDD (major depressive disorder), recurrent episode, severe   Axis Diagnosis:  AXIS I:  MDD (major depressive disorder), recurrent episode, severe AXIS II:  Deferred AXIS III:   Past Medical History  Diagnosis Date  . Hypertension   . Anxiety   . Tachycardia - pulse   . Degenerative disk disease   . Spinal stenosis   . Degenerative disk disease   . Tobacco abuse   . Obesity   . Polysubstance abuse   . Chest pain     Hospital, March, 2014, negative enzymes, patient refused in-hospital  stress test, patient canceled outpatient stress test   AXIS IV:  other psychosocial or environmental problems and Hx. polysubstance dependence AXIS V:  62  Level of Care:  OP  Hospital Course: Travis [name] is 48 years old, Caucasian male. He reports, "The ambulance took me to the hospital yesterday. Me and my mom had an argument. She got mad and wanted to leave. She is 46 years old, I did not want her to drive, not even so if she was upset. I used a knife and cut  my arm just to scare and aggravate my mother. This is not an intent or attempt to kill myself. I don't want to die. I did not know that the knife was that sharp. I have been having panic attacks for many years. I'm not here for drugs and or alcohol detox.  Hajime was admitted to the hospital for making suicide gesture by lacerating his left arm with a knife. During the admission assessment, he stated that he really did not mean to kill himself, rather wanted to aggravate his mother, only that he did not realize that the knife he used was that sharp. He wanted to be discharged as soon as his arrival to this hospital. Travis [name] has been here for 2 days. He remains alert and oriented x 3, aware of situation and participating in group counseling sessions and activities. He learned coping skills.  While a patient in this hospital, Tarrin was medicated and discharged on; Prozac 10 mg daily for depressions and Hydroxyzine 25 mg three times daily as needed for anxiety/tension. He received medication management/wound care for the self inflicted laceration to his left arm. He was resumed in all his pertinent home medications for his other pre-existing medical conditions that he presented. He tolerated his treatment regimen without any adverse effects and or reactions.  Travis [name] is currently being discharged to his home to continue psychiatric treatment at the [facility] here in [place], [place]. He is provided with all the pertinent information required to make this appointment without problems. Upon discharge, he adamantly denies any SIHI, AVH, delusional thoughts and or paranoia. He was provided with a 14 days worth, supply samples of his [facility] discharge medications. He left [facility] with personal belongings in no apparent distress. Transportation per friend.  Consults:  psychiatry  Significant Diagnostic Studies:  labs: CBC with diff, CMP, UDS, toxicology tests, U/A  Discharge Vitals:   Blood pressure 131/93, pulse 74,  temperature 97.3 F (36.3 C), temperature source Oral, resp. rate 18, height 5' 11"  (1.803 m), weight 131.09 kg (289 lb). Body mass index is 40.33 kg/(m^2). Lab Results:   Results for orders placed during the hospital encounter of 09/28/13 (from the past 72 hour(s))  CBC     Status: None   Collection Time    09/28/13  3:15 PM      Result Value Ref Range   WBC 8.3  4.0 - 10.5 K/uL   RBC 4.94  4.22 - 5.81 MIL/uL   Hemoglobin 14.4  13.0 - 17.0 g/dL   HCT 42.4  39.0 - 52.0 %   MCV 85.8  78.0 - 100.0 fL   MCH 29.1  26.0 - 34.0 pg   MCHC 34.0  30.0 - 36.0 g/dL   RDW 13.1  11.5 - 15.5 %   Platelets 180  150 - 400 K/uL  COMPREHENSIVE METABOLIC PANEL     Status: Abnormal   Collection Time    09/28/13  3:15 PM      Result Value Ref Range   Sodium 135 (*) 137 - 147 mEq/L   Potassium 4.5  3.7 - 5.3 mEq/L   Chloride 97  96 - 112  mEq/L   CO2 28  19 - 32 mEq/L   Glucose, Bld 119 (*) 70 - 99 mg/dL   BUN 6  6 - 23 mg/dL   Creatinine, Ser 0.99  0.50 - 1.35 mg/dL   Calcium 9.3  8.4 - 10.5 mg/dL   Total Protein 7.6  6.0 - 8.3 g/dL   Albumin 3.6  3.5 - 5.2 g/dL   AST 23  0 - 37 U/L   ALT 20  0 - 53 U/L   Alkaline Phosphatase 79  39 - 117 U/L   Total Bilirubin 0.5  0.3 - 1.2 mg/dL   GFR calc non Af Amer >90  >90 mL/min   GFR calc Af Amer >90  >90 mL/min   Comment: (NOTE)     The eGFR has been calculated using the CKD EPI equation.     This calculation has not been validated in all clinical situations.     eGFR's persistently <90 mL/min signify possible Chronic Kidney     Disease.   Anion gap 10  5 - 15  ETHANOL     Status: None   Collection Time    09/28/13  3:15 PM      Result Value Ref Range   Alcohol, Ethyl (B) <11  0 - 11 mg/dL   Comment:            LOWEST DETECTABLE LIMIT FOR     SERUM ALCOHOL IS 11 mg/dL     FOR MEDICAL PURPOSES ONLY  ACETAMINOPHEN LEVEL     Status: None   Collection Time    09/28/13  3:15 PM      Result Value Ref Range   Acetaminophen (Tylenol), Serum <15.0   10 - 30 ug/mL   Comment:            THERAPEUTIC CONCENTRATIONS VARY     SIGNIFICANTLY. A RANGE OF 10-30     ug/mL MAY BE AN EFFECTIVE     CONCENTRATION FOR MANY PATIENTS.     HOWEVER, SOME ARE BEST TREATED     AT CONCENTRATIONS OUTSIDE THIS     RANGE.     ACETAMINOPHEN CONCENTRATIONS     >150 ug/mL AT 4 HOURS AFTER     INGESTION AND >50 ug/mL AT 12     HOURS AFTER INGESTION ARE     OFTEN ASSOCIATED WITH TOXIC     REACTIONS.  SALICYLATE LEVEL     Status: Abnormal   Collection Time    09/28/13  3:15 PM      Result Value Ref Range   Salicylate Lvl <5.9 (*) 2.8 - 20.0 mg/dL  URINE RAPID DRUG SCREEN (HOSP PERFORMED)     Status: Abnormal   Collection Time    09/28/13  7:36 PM      Result Value Ref Range   Opiates NONE DETECTED  NONE DETECTED   Cocaine NONE DETECTED  NONE DETECTED   Benzodiazepines POSITIVE (*) NONE DETECTED   Amphetamines NONE DETECTED  NONE DETECTED   Tetrahydrocannabinol NONE DETECTED  NONE DETECTED   Barbiturates NONE DETECTED  NONE DETECTED   Comment:            DRUG SCREEN FOR MEDICAL PURPOSES     ONLY.  IF CONFIRMATION IS NEEDED     FOR ANY PURPOSE, NOTIFY LAB     WITHIN 5 DAYS.                LOWEST DETECTABLE LIMITS     FOR URINE DRUG  SCREEN     Drug Class       Cutoff (ng/mL)     Amphetamine      1000     Barbiturate      200     Benzodiazepine   790     Tricyclics       383     Opiates          300     Cocaine          300     THC              50    Physical Findings: AIMS: Facial and Oral Movements Muscles of Facial Expression: None, normal Lips and Perioral Area: None, normal Jaw: None, normal Tongue: None, normal,Extremity Movements Upper (arms, wrists, hands, fingers): None, normal Lower (legs, knees, ankles, toes): None, normal, Trunk Movements Neck, shoulders, hips: None, normal, Overall Severity Severity of abnormal movements (highest score from questions above): None, normal Incapacitation due to abnormal movements: None,  normal Patient's awareness of abnormal movements (rate only patient's report): No Awareness, Dental Status Current problems with teeth and/or dentures?: No Does patient usually wear dentures?: No  CIWA:    COWS:     Psychiatric Specialty Exam: See Psychiatric Specialty Exam and Suicide Risk Assessment completed by Attending Physician prior to discharge.  Discharge destination:  Home  Is patient on multiple antipsychotic therapies at discharge:  No   Has Patient had three or more failed trials of antipsychotic monotherapy by history:  No  Recommended Plan for Multiple Antipsychotic Therapies: NA    Medication List       Indication   bacitracin ointment  Apply topically 2 (two) times daily. For wound care   Indication:  Wound care     clindamycin 300 MG capsule  Commonly known as:  CLEOCIN  Take 1 capsule (300 mg total) by mouth every 8 (eight) hours. For infection   Indication:  Infection     cloNIDine 0.3 MG tablet  Commonly known as:  CATAPRES  Take 1 tablet (0.3 mg total) by mouth every morning. For HTN   Indication:  High Blood Pressure     FLUoxetine 10 MG capsule  Commonly known as:  PROZAC  Take 1 capsule (10 mg total) by mouth daily. For depression   Indication:  Depression     hydrOXYzine 25 MG tablet  Commonly known as:  ATARAX/VISTARIL  Take 1 tablet (25 mg) three times daily as needed & 2 tablets (50 mg) at bedtime: For anxiety/tension   Indication:  Tension, Anxiety     lisinopril 20 MG tablet  Commonly known as:  PRINIVIL,ZESTRIL  Take 1 tablet (20 mg total) by mouth every morning. For high blood pressure   Indication:  High Blood Pressure     metoprolol 50 MG tablet  Commonly known as:  LOPRESSOR  Take 1 tablet (50 mg total) by mouth 2 (two) times daily. For high blood pressure   Indication:  High Blood Pressure     ranitidine 75 MG tablet  Commonly known as:  ZANTAC  Take 1 tablet (75 mg total) by mouth daily as needed for heartburn.    Indication:  Gastroesophageal Reflux Disease     traMADol 50 MG tablet  Commonly known as:  ULTRAM  Take 2 tablets (100 mg total) by mouth every 6 (six) hours as needed for moderate pain.   Indication:  Moderate to Moderately Severe Pain  Follow-up Information   Follow up with [facility]. (Walk in between 8am-9am Monday through Friday for hospital follow-up, medication management/assessment for therapy services. )    Contact information:   [address], [place] [address] Phone: [phone] Fax: [phone]      Follow up with [facility]. (Call office on day of discharge to schedule follow-up for medical medication management.)    Contact information:   [place],  [address] Phone: [phone] Fax: [phone]     Follow-up recommendations: Activity:  As tolerated Diet: As recommended by your primary care doctor. Keep all scheduled follow-up appointments as recommended.   Comments: Take all your medications as prescribed by your mental healthcare provider. Report any adverse effects and or reactions from your medicines to your outpatient provider promptly. Patient is instructed and cautioned to not engage in alcohol and or illegal drug use while on prescription medicines. In the event of worsening symptoms, patient is instructed to call the crisis hotline, 911 and or go to the nearest ED for appropriate evaluation and treatment of symptoms. Follow-up with your primary care provider for your other medical issues, concerns and or health care needs.     Total Discharge Time:  Greater than 30 minutes.  Signed: Encarnacion Slates, [place] 09/30/2013, 10:01 AM   Patient seen, Suicide Assessment Completed.  Disposition Plan Reviewed

## 2013-09-30 NOTE — Progress Notes (Signed)
Patient ID: Travis LimboJames [name], male   DOB: [birth year], 48 y.o.   MRN: [id number] He has been up and to groups interacting when spoken to. Did not fill out his self inventory> He denies SI thoughts and said he cut himself to keep his mother from driving the car because she is to old. Spoke of his being the only caregiver to her out of 7 kids.

## 2013-09-30 NOTE — [facility] Group Notes (Signed)
0900 nursing orientation group    The focus of this group is to educate the patient on the purpose and policies of crisis stabilization and provide a format to answer questions about their admission.  The group details unit policies and expectations of patients while admitted.  Pt was an active participant in group.  He was appropriate interacting with peers and sharing.  He talked about caring for his mother and his 7 siblings did not help him take care of her. He stated,on the days she is having a good day I am happy to be with her"

## 2013-09-30 NOTE — Progress Notes (Signed)
[facility] :  Will you be returning to the same living situation after discharge: Yes,  home with mom At discharge, do you have transportation home?:Yes,  "a friend" Do you have the ability to pay for your medications:Yes,  mental health  Release of information consent forms completed and submitted to Medical Records by CSW.  [name]: Follow-up Information   Follow up with [facility]. (Walk in between 8am-9am Monday through Friday for hospital follow-up, medication management/assessment for therapy services. )    Contact information:   [address] [place], [place] [address] Phone: [phone] Fax: [phone]      [facility] with [facility]. (Call office on day of discharge to schedule follow-up for medical medication management.)    Contact information:   [address] [place], [place] [address] Phone: [phone] Fax: [phone]      Patient denies SI/HI:   Yes,  during group/self report.     Safety Planning and Suicide Prevention discussed:  Yes,  contact attempts made with p'ts mother. SPE completed with pt and he was encouraged to share SPI pamphlet with his mother, ask questions, and talk about any concerns. CSW left voicemail on his mother's home number requesting call back.   Smart, Reshma Hoey LCSWA  09/30/2013, 9:30 AM

## 2013-10-02 NOTE — ED Provider Notes (Signed)
Medical screening examination/treatment/procedure(s) were performed by non-physician practitioner and as supervising physician I was immediately available for consultation/collaboration.   EKG Interpretation None        Glynn OctaveStephen Aliegha Paullin, MD 10/02/13 [phone]

## 2013-10-04 NOTE — Progress Notes (Addendum)
Patient Discharge Instructions:  After Visit Summary (AVS):   Faxed to:  10/04/13 Discharge Summary Note:   Faxed to:  10/04/13 Psychiatric Admission Assessment Note:   Faxed to:  10/04/13 Suicide Risk Assessment - Discharge Assessment:   Faxed to:  10/04/13 Faxed/Sent to the Next Level Care provider:  10/04/13 Next Level Care Provider Has Access to the EMR, 10/04/13 [facility] @ [phone] Records provided to [facility] via CH[facility]/Epic access  Jerelene ReddenSheena E Baudette, 10/04/2013, 3:33 PM

## 2013-10-06 ENCOUNTER — Telehealth: Payer: Self-pay | Admitting: Family Medicine

## 2013-10-06 NOTE — Telephone Encounter (Signed)
[facility] Outside Line Call  Pt reports concern for medication side-effects; [facility] (cutting on his arm, for which he was treated with antibiotics, and Prozac was started, last week). He states his arm is "healing fine. He thinks the Prozac is causing some "bad side effects." He has been taking it since last Thursday (7/30). He has intermittent episodes where "everything goes black," and he "feels like he might pass out." He denies frank syncope, falls, hitting his head, etc. Of note, he is a previous pt of [facility] (most recently Dr. Lum BabeEniola; I received this call as the resident responding to outside line calls made to the hospital forwarded to the [facility]) but he states [facility] is [facility] because "she [his doctor] and I disagree on some things," and he does not have an appointment until next week.  Advised pt to call Behavioral Health to discuss with them, as they are the ones that prescribed this medication. Did explain to him that Prozac is a medicine that may need to be tapered off, but that since he has been on it for less than a week he can likely just stop it until [facility] and/or his new PCP's office. Advised him to contact new PCP's office to see if he can be seen sooner. Also advised him to seek care at Urgent Care or the ED if he has worse symptoms, frank syncope, falls especially if he injuries his head, etc. Pt voiced understanding and expressed appreciation.  Bobbye Mortonhristopher M Street, MD PGY-3, [facility] 10/06/2013, 12:46 PM

## 2013-10-07 ENCOUNTER — Emergency Department (HOSPITAL_COMMUNITY)
Admission: EM | Admit: 2013-10-07 | Discharge: 2013-10-07 | Disposition: A | Discharge status: Home / Self Care | Payer: No Typology Code available for payment source | Attending: Emergency Medicine | Admitting: Emergency Medicine

## 2013-10-07 ENCOUNTER — Encounter (HOSPITAL_COMMUNITY): Payer: Self-pay | Admitting: Emergency Medicine

## 2013-10-07 DIAGNOSIS — Z760 Encounter for issue of repeat prescription: Secondary | ICD-10-CM | POA: Insufficient documentation

## 2013-10-07 DIAGNOSIS — F17200 Nicotine dependence, unspecified, uncomplicated: Secondary | ICD-10-CM | POA: Insufficient documentation

## 2013-10-07 DIAGNOSIS — F411 Generalized anxiety disorder: Secondary | ICD-10-CM | POA: Insufficient documentation

## 2013-10-07 DIAGNOSIS — K625 Hemorrhage of anus and rectum: Secondary | ICD-10-CM | POA: Insufficient documentation

## 2013-10-07 DIAGNOSIS — Z880 Allergy status to penicillin: Secondary | ICD-10-CM | POA: Insufficient documentation

## 2013-10-07 DIAGNOSIS — R32 Unspecified urinary incontinence: Secondary | ICD-10-CM | POA: Insufficient documentation

## 2013-10-07 DIAGNOSIS — Z789 Other specified health status: Secondary | ICD-10-CM

## 2013-10-07 DIAGNOSIS — Z79899 Other long term (current) drug therapy: Secondary | ICD-10-CM | POA: Insufficient documentation

## 2013-10-07 DIAGNOSIS — Z792 Long term (current) use of antibiotics: Secondary | ICD-10-CM | POA: Insufficient documentation

## 2013-10-07 DIAGNOSIS — E669 Obesity, unspecified: Secondary | ICD-10-CM | POA: Insufficient documentation

## 2013-10-07 DIAGNOSIS — Z8739 Personal history of other diseases of the musculoskeletal system and connective tissue: Secondary | ICD-10-CM | POA: Insufficient documentation

## 2013-10-07 DIAGNOSIS — I10 Essential (primary) hypertension: Secondary | ICD-10-CM | POA: Insufficient documentation

## 2013-10-07 DIAGNOSIS — G8929 Other chronic pain: Secondary | ICD-10-CM | POA: Insufficient documentation

## 2013-10-07 DIAGNOSIS — R000 Tachycardia, unspecified: Secondary | ICD-10-CM | POA: Insufficient documentation

## 2013-10-07 DIAGNOSIS — K921 Melena: Secondary | ICD-10-CM | POA: Insufficient documentation

## 2013-10-07 DIAGNOSIS — M549 Dorsalgia, unspecified: Secondary | ICD-10-CM | POA: Insufficient documentation

## 2013-10-07 LAB — COMPREHENSIVE METABOLIC PANEL
ALBUMIN: 3.4 g/dL — AB (ref 3.5–5.2)
ALT: 17 U/L (ref 0–53)
ANION GAP: 8 (ref 5–15)
AST: 24 U/L (ref 0–37)
Alkaline Phosphatase: 124 U/L — ABNORMAL HIGH (ref 39–117)
BUN: 7 mg/dL (ref 6–23)
CALCIUM: 8.8 mg/dL (ref 8.4–10.5)
CO2: 29 mEq/L (ref 19–32)
Chloride: 101 mEq/L (ref 96–112)
Creatinine, Ser: 0.89 mg/dL (ref 0.50–1.35)
GFR calc Af Amer: >90 mL/min (ref >90)
GFR calc non Af Amer: >90 mL/min (ref >90)
GLUCOSE: 179 mg/dL — AB (ref 70–99)
Potassium: 4.8 mEq/L (ref 3.7–5.3)
Sodium: 138 mEq/L (ref 137–147)
TOTAL PROTEIN: 7.4 g/dL (ref 6.0–8.3)
Total Bilirubin: 0.4 mg/dL (ref 0.3–1.2)

## 2013-10-07 LAB — URINALYSIS, ROUTINE W REFLEX MICROSCOPIC
Bilirubin Urine: NEGATIVE
Glucose, UA: 100 mg/dL — AB
Hgb urine dipstick: NEGATIVE
Ketones, ur: NEGATIVE mg/dL
LEUKOCYTES UA: NEGATIVE
Nitrite: NEGATIVE
PH: 7 (ref 5.0–8.0)
Protein, ur: NEGATIVE mg/dL
SPECIFIC GRAVITY, URINE: 1.018 (ref 1.005–1.030)
UROBILINOGEN UA: 1 mg/dL (ref 0.0–1.0)

## 2013-10-07 LAB — CBC WITH DIFFERENTIAL/PLATELET
BASOS PCT: 1 % (ref 0–1)
Basophils Absolute: 0 10*3/uL (ref 0.0–0.1)
EOS ABS: 0.2 10*3/uL (ref 0.0–0.7)
EOS PCT: 3 % (ref 0–5)
HCT: 42.2 % (ref 39.0–52.0)
Hemoglobin: 14.1 g/dL (ref 13.0–17.0)
LYMPHS ABS: 1.9 10*3/uL (ref 0.7–4.0)
Lymphocytes Relative: 29 % (ref 12–46)
MCH: 28.8 pg (ref 26.0–34.0)
MCHC: 33.4 g/dL (ref 30.0–36.0)
MCV: 86.3 fL (ref 78.0–100.0)
Monocytes Absolute: 0.4 10*3/uL (ref 0.1–1.0)
Monocytes Relative: 7 % (ref 3–12)
NEUTROS PCT: 60 % (ref 43–77)
Neutro Abs: 3.9 10*3/uL (ref 1.7–7.7)
Platelets: 170 10*3/uL (ref 150–400)
RBC: 4.89 MIL/uL (ref 4.22–5.81)
RDW: 13.2 % (ref 11.5–15.5)
WBC: 6.4 10*3/uL (ref 4.0–10.5)

## 2013-10-07 LAB — POC OCCULT BLOOD, ED: FECAL OCCULT BLD: POSITIVE — AB

## 2013-10-07 MED ORDER — METOPROLOL TARTRATE 50 MG PO TABS: 100.0000 mg | ORAL_TABLET | Freq: Two times a day (BID) | ORAL | Status: DC

## 2013-10-07 MED ORDER — LISINOPRIL 20 MG PO TABS: 20.0000 mg | ORAL_TABLET | Freq: Every day | ORAL | Status: DC

## 2013-10-07 NOTE — ED Provider Notes (Signed)
CSN: [id number]     Arrival date & time 10/07/13  1342 History   None    Chief Complaint  Patient presents with  . Rectal Bleeding  . Back Pain  . Medication Refill   Travis [name] is a 48 yo Caucasian male with PMH of HTN, DDD, spinal stenosis, and polysubstance abuse who presents today with complaints that his antihypertensive medications are out he would also like something for his chronic back pain. Patient says he takes tramadol but is out of this. He admits that he has been fired by his previous PCP due to clonidine misuse. He is supposed to establish care w/new PCP this coming week.   He endorses that he's had chronic nighttime urinary incontinence and has seen a urologist for this and has found nothing wrong. He at admits no recent back trauma and says this is his chronic pain. Located in his lower back, aching sensation, on both sides of low back. Worse with walking. Denies any saddle anesthesia symptoms and says that his urinary incontinence has been going on for several years. Not worsening.   Bloody BMs come and go, last was today. Blood is sometimes mixed in w/stool and sometimes on top of stool. No abd pain. No family hx of IBD. No diarrhea. Bloody BMs are not painful.   He denies drug use, focal neural deficits, cauda equina sx, numbness, tingling, presyncope, syncope, CP, SOB, fever, chills, constipation, hematemesis, dysuria, hematuria, sick contacts, or recent travel.   (Consider location/radiation/quality/duration/timing/severity/associated sxs/prior Treatment) Patient is a 48 y.o. male presenting with back pain.  Back Pain Location:  Lumbar spine Quality:  Aching Radiates to:  L posterior upper leg Pain severity:  Moderate Pain is:  Same all the time Onset quality: chronic. Timing:  Constant Progression:  Unchanged Chronicity:  Chronic Context: not falling and not lifting heavy objects   Relieved by:  Narcotics Ineffective treatments:  Being still and  NSAIDs Associated symptoms: bladder incontinence (at night, for several years)   Associated symptoms: no abdominal pain, no bowel incontinence, no chest pain, no dysuria, no fever, no headaches, no leg pain, no numbness, no paresthesias, no tingling and no weakness     Past Medical History  Diagnosis Date  . Hypertension   . Anxiety   . Tachycardia - pulse   . Degenerative disk disease   . Spinal stenosis   . Degenerative disk disease   . Tobacco abuse   . Obesity   . Polysubstance abuse   . Chest pain     Hospital, March, 2014, negative enzymes, patient refused in-hospital  stress test, patient canceled outpatient stress test   History reviewed. No pertinent past surgical history. Family History  Problem Relation Age of Onset  . Stroke Mother   . Hypertension Mother   . Stroke Father   . Hypertension Father   . Dementia Father   . Diabetes Father   . Heart disease Father   . Cancer Sister     brain  . Heart disease Brother    History  Substance Use Topics  . Smoking status: Current Every Day Smoker -- 0.50 packs/day    Types: Cigarettes  . Smokeless tobacco: Never Used  . Alcohol Use: No    Review of Systems  Unable to perform ROS Constitutional: Negative for fever and chills.  Respiratory: Negative for shortness of breath.   Cardiovascular: Negative for chest pain, palpitations and leg swelling.  Gastrointestinal: Positive for blood in stool. Negative for nausea, vomiting, abdominal  pain, diarrhea, constipation, abdominal distention and bowel incontinence.  Genitourinary: Positive for bladder incontinence (at night, for several years). Negative for dysuria, frequency, flank pain and decreased urine volume.  Musculoskeletal: Positive for back pain.  Skin: Negative for wound.  Neurological: Negative for dizziness, tingling, speech difficulty, weakness, light-headedness, numbness, headaches and paresthesias.  All other systems reviewed and are  negative.     Allergies  Blueberry flavor; Other; Penicillins; Robaxin; and Toradol  Home Medications   Prior to Admission medications   Medication Sig Start Date End Date Taking? Authorizing Provider  bacitracin ointment Apply topically 2 (two) times daily. For wound care 09/30/13   Sanjuana Kava, NP  clindamycin (CLEOCIN) 300 MG capsule Take 1 capsule (300 mg total) by mouth every 8 (eight) hours. For infection 09/30/13   Sanjuana Kava, NP  cloNIDine (CATAPRES) 0.3 MG tablet Take 1 tablet (0.3 mg total) by mouth every morning. For HTN 09/30/13   Sanjuana Kava, NP  FLUoxetine (PROZAC) 10 MG capsule Take 1 capsule (10 mg total) by mouth daily. For depression 09/30/13   Sanjuana Kava, NP  hydrOXYzine (ATARAX/VISTARIL) 25 MG tablet Take 1 tablet (25 mg) three times daily as needed & 2 tablets (50 mg) at bedtime: For anxiety/tension 09/30/13   Sanjuana Kava, NP  lisinopril (PRINIVIL,ZESTRIL) 20 MG tablet Take 1 tablet (20 mg total) by mouth every morning. For high blood pressure 09/30/13   Sanjuana Kava, NP  lisinopril (PRINIVIL,ZESTRIL) 20 MG tablet Take 1 tablet (20 mg total) by mouth daily. 10/07/13   Rachelle Hora, MD  metoprolol (LOPRESSOR) 50 MG tablet Take 1 tablet (50 mg total) by mouth 2 (two) times daily. For high blood pressure 09/30/13   Sanjuana Kava, NP  metoprolol (LOPRESSOR) 50 MG tablet Take 2 tablets (100 mg total) by mouth 2 (two) times daily. 10/07/13   Rachelle Hora, MD  ranitidine (ZANTAC) 75 MG tablet Take 1 tablet (75 mg total) by mouth daily as needed for heartburn. 09/30/13   Sanjuana Kava, NP  traMADol (ULTRAM) 50 MG tablet Take 2 tablets (100 mg total) by mouth every 6 (six) hours as needed for moderate pain. 09/30/13   Sanjuana Kava, NP   BP 147/96  Pulse 82  Temp(Src) 98.1 F (36.7 C) (Oral)  Resp 16  Wt 303 lb 8 oz (137.667 kg)  SpO2 96% Physical Exam  Nursing note and vitals reviewed. Constitutional: He is oriented to person, place, and time. He appears well-developed and  well-nourished. No distress.  HENT:  Head: Normocephalic and atraumatic.  Eyes: Pupils are equal, round, and reactive to light.  Neck: Normal range of motion.  Cardiovascular: Normal rate, regular rhythm, normal heart sounds and intact distal pulses.  Exam reveals no gallop and no friction rub.   No murmur heard. Pulmonary/Chest: Effort normal and breath sounds normal. No respiratory distress. He has no wheezes. He has no rales. He exhibits no tenderness.  Abdominal: Soft. Bowel sounds are normal. He exhibits no distension and no mass. There is no tenderness. There is no rebound and no guarding.  Genitourinary: Prostate normal and penis normal. Guaiac positive stool. No penile tenderness.  Musculoskeletal: Normal range of motion. He exhibits tenderness (lower paraspinal mm (lumbar)).  + straight leg raise on left  Lymphadenopathy:    He has no cervical adenopathy.  Neurological: He is alert and oriented to person, place, and time. He displays normal reflexes. No cranial nerve deficit. He exhibits normal muscle tone. Coordination normal.  Skin: Skin is warm and dry. No rash noted. He is not diaphoretic. No erythema.    ED Course  Procedures (including critical care time) Labs Review Labs Reviewed  COMPREHENSIVE METABOLIC PANEL - Abnormal; Notable for the following:    Glucose, Bld 179 (*)    Albumin 3.4 (*)    Alkaline Phosphatase 124 (*)    All other components within normal limits  URINALYSIS, ROUTINE W REFLEX MICROSCOPIC - Abnormal; Notable for the following:    Glucose, UA 100 (*)    All other components within normal limits  POC OCCULT BLOOD, ED - Abnormal; Notable for the following:    Fecal Occult Bld POSITIVE (*)    All other components within normal limits  CBC WITH DIFFERENTIAL    Imaging Review No results found.   EKG Interpretation None      MDM   48 year old Caucasian male here with chronic back pain. Please see history of present illness for details. On exam  patient in NAD, AF VSS. Physical exam shows mild tenderness to palpation on bilateral lower back. No signs of erythema or tenderness over her spine. No focal neural deficits on exam. No cauda equina symptoms including saddle anesthesia. No testicular tenderness. Physical exam benign. At this time will obtain CBC  to ensure patient's hemoglobin is stable. Will also obtain UA and CMP.   Hemoccult is positive for microscopic hematuria. No gross blood on exam. Likely small internal hemorrhoid. No family hx and exam inconsistent w/IBD. No abd pain or other GI sx. UA shows no sign of UTI. Electrolyte wnl.   Pt stable for DC home. Will not give narcotics for his chronic pain. Encouraged him to continue motrin scheduled. He will follow up with new PCP this coming week. Will give short course of BP medications until he sees his new provider: Lisinopril and Metoprolol.   Strict return precautions include any presyncope/syncope, extreme fatigue or heavy bleeding, severe abd pain, cauda equina sx (bladder/bowel incontinence or retention, or saddle anesthesia), or any focal neural deficits.   Final diagnoses:  Chronic back pain  Has run out of medications    Pt was seen under the supervision of Dr. Rosalia Hammers.     Rachelle Hora, MD 10/07/13 1545  Rachelle Hora, MD 10/07/13 [phone]

## 2013-10-07 NOTE — Discharge Instructions (Signed)

## 2013-10-07 NOTE — ED Notes (Signed)
Dr. Katrinka BlazingSmith back at the bedside to speak with pt before discharge

## 2013-10-07 NOTE — ED Notes (Signed)
Pt reports he is concerned about his rectal bleeding. Pt reports hx of hemorrhoids. Pt states he needs his chronic back pain medications refilled as well as his daily medications filled as he is in the process of switching doctors. Pt awake, alert, oriented x4, ambulatory with cane in waiting room.

## 2013-10-12 ENCOUNTER — Encounter: Payer: Self-pay | Admitting: Internal Medicine

## 2013-10-12 ENCOUNTER — Ambulatory Visit: Payer: No Typology Code available for payment source | Attending: Internal Medicine | Admitting: Internal Medicine

## 2013-10-12 VITALS — BP 141/106 | HR 107 | Temp 98.5°F | Resp 14 | Ht 71.0 in | Wt 295.0 lb

## 2013-10-12 DIAGNOSIS — R000 Tachycardia, unspecified: Secondary | ICD-10-CM | POA: Insufficient documentation

## 2013-10-12 DIAGNOSIS — F411 Generalized anxiety disorder: Secondary | ICD-10-CM | POA: Insufficient documentation

## 2013-10-12 DIAGNOSIS — I10 Essential (primary) hypertension: Secondary | ICD-10-CM | POA: Insufficient documentation

## 2013-10-12 DIAGNOSIS — Z8249 Family history of ischemic heart disease and other diseases of the circulatory system: Secondary | ICD-10-CM | POA: Insufficient documentation

## 2013-10-12 DIAGNOSIS — G894 Chronic pain syndrome: Secondary | ICD-10-CM | POA: Insufficient documentation

## 2013-10-12 DIAGNOSIS — Z91018 Allergy to other foods: Secondary | ICD-10-CM | POA: Insufficient documentation

## 2013-10-12 DIAGNOSIS — Z888 Allergy status to other drugs, medicaments and biological substances status: Secondary | ICD-10-CM | POA: Insufficient documentation

## 2013-10-12 DIAGNOSIS — Z880 Allergy status to penicillin: Secondary | ICD-10-CM | POA: Insufficient documentation

## 2013-10-12 DIAGNOSIS — IMO0002 Reserved for concepts with insufficient information to code with codable children: Secondary | ICD-10-CM | POA: Insufficient documentation

## 2013-10-12 DIAGNOSIS — F321 Major depressive disorder, single episode, moderate: Secondary | ICD-10-CM | POA: Insufficient documentation

## 2013-10-12 DIAGNOSIS — F17200 Nicotine dependence, unspecified, uncomplicated: Secondary | ICD-10-CM | POA: Insufficient documentation

## 2013-10-12 LAB — POCT GLYCOSYLATED HEMOGLOBIN (HGB A1C): HEMOGLOBIN A1C: 5.9

## 2013-10-12 MED ORDER — LISINOPRIL 20 MG PO TABS: 20.0000 mg | ORAL_TABLET | Freq: Every morning | ORAL | Status: DC

## 2013-10-12 MED ORDER — TRAMADOL HCL 50 MG PO TABS: 100.0000 mg | ORAL_TABLET | Freq: Two times a day (BID) | ORAL | Status: DC | PRN

## 2013-10-12 NOTE — Progress Notes (Signed)
Patient ID: Travis [name], male   DOB: [birth year], 48 y.o.   MRN: [id number]   Travis [name], is a 48 y.o. male  [id number]:[id number]  [id number]  DOB - 11/19/1965  CC:  Chief Complaint  Patient presents with  . Follow-up  . Hospitalization Follow-up       HPI: Travis [name] is a 48 y.o. male here today to establish medical care. Patient has medical history significant for hypertension, generalized anxiety her major depression, degenerative disc disease, polysubstance abuse, and morbid obesity. He was recently admitted to hospital for suicidal attempt involved with cutting of his own hand, because stitches and was discharged after appropriate behavioral and psychiatry therapy. Patient claims he does not want to die and his cutting gesture was to scare his 21 years old mother not to drive at night. She is here today for a follow up. He has no complaint today but needs stitch removal. She continue to smoke about one pack of cigarette per day, drinks alcohol. Patient has No headache, No chest pain, No abdominal pain - No Nausea, No new weakness tingling or numbness, No Cough - SOB.  Allergies  Allergen Reactions  . Blueberry Flavor Hives  . Other Other (See Comments)    Muscle relaxant Robaxin causes rapid heart rate   . Penicillins Hives, Itching and Other (See Comments)    Hallucinations.  . Robaxin [Methocarbamol] Palpitations  . Toradol [Ketorolac Tromethamine] Hives and Other (See Comments)    Headache   Past Medical History  Diagnosis Date  . Hypertension   . Anxiety   . Tachycardia - pulse   . Degenerative disk disease   . Spinal stenosis   . Degenerative disk disease   . Tobacco abuse   . Obesity   . Polysubstance abuse   . Chest pain     Hospital, March, 2014, negative enzymes, patient refused in-hospital  stress test, patient canceled outpatient stress test   Current Outpatient Prescriptions on File Prior to Visit  Medication Sig Dispense Refill  . bacitracin ointment  Apply topically 2 (two) times daily. For wound care  120 g  0  . cloNIDine (CATAPRES) 0.3 MG tablet Take 1 tablet (0.3 mg total) by mouth every morning. For HTN      . FLUoxetine (PROZAC) 10 MG capsule Take 1 capsule (10 mg total) by mouth daily. For depression  30 capsule  0  . hydrOXYzine (ATARAX/VISTARIL) 25 MG tablet Take 1 tablet (25 mg) three times daily as needed & 2 tablets (50 mg) at bedtime: For anxiety/tension  60 tablet  0  . metoprolol (LOPRESSOR) 50 MG tablet Take 1 tablet (50 mg total) by mouth 2 (two) times daily. For high blood pressure      . ranitidine (ZANTAC) 75 MG tablet Take 1 tablet (75 mg total) by mouth daily as needed for heartburn.      . clindamycin (CLEOCIN) 300 MG capsule Take 1 capsule (300 mg total) by mouth every 8 (eight) hours. For infection       No current facility-administered medications on file prior to visit.   Family History  Problem Relation Age of Onset  . Stroke Mother   . Hypertension Mother   . Stroke Father   . Hypertension Father   . Dementia Father   . Diabetes Father   . Heart disease Father   . Cancer Sister     brain  . Heart disease Brother    History   Social History  . Marital Status: Divorced  Spouse Name: N/A    Number of Children: N/A  . Years of Education: N/A   Occupational History  . Not on file.   Social History Main Topics  . Smoking status: Current Every Day Smoker -- 0.50 packs/day    Types: Cigarettes  . Smokeless tobacco: Never Used  . Alcohol Use: No  . Drug Use: No  . Sexual Activity: No   Other Topics Concern  . Not on file   Social History Narrative  . No narrative on file    Review of Systems: Constitutional: Negative for fever, chills, diaphoresis, activity change, appetite change and fatigue. HENT: Negative for ear pain, nosebleeds, congestion, facial swelling, rhinorrhea, neck pain, neck stiffness and ear discharge.  Eyes: Negative for pain, discharge, redness, itching and visual  disturbance. Respiratory: Negative for cough, choking, chest tightness, shortness of breath, wheezing and stridor.  Cardiovascular: Negative for chest pain, palpitations and leg swelling. Gastrointestinal: Negative for abdominal distention. Genitourinary: Negative for dysuria, urgency, frequency, hematuria, flank pain, decreased urine volume, difficulty urinating and dyspareunia.  Musculoskeletal: Negative for back pain, joint swelling, arthralgia and gait problem. Neurological: Negative for dizziness, tremors, seizures, syncope, facial asymmetry, speech difficulty, weakness, light-headedness, numbness and headaches.  Hematological: Negative for adenopathy. Does not bruise/bleed easily. Psychiatric/Behavioral: Negative for hallucinations, behavioral problems, confusion, dysphoric mood, decreased concentration and agitation.    Objective:   Filed Vitals:   10/12/13 1522  BP: 141/106  Pulse: 107  Temp: 98.5 F (36.9 C)  Resp: 14    Physical Exam: Constitutional: Patient appears well-developed and well-nourished. No distress. HENT: Normocephalic, atraumatic, External right and left ear normal. Oropharynx is clear and moist.  Eyes: Conjunctivae and EOM are normal. PERRLA, no scleral icterus. Neck: Normal ROM. Neck supple. No JVD. No tracheal deviation. No thyromegaly. CVS: RRR, S1/S2 +, no murmurs, no gallops, no carotid bruit.  Pulmonary: Effort and breath sounds normal, no stridor, rhonchi, wheezes, rales.  Abdominal: Soft. BS +, no distension, tenderness, rebound or guarding.  Musculoskeletal: Normal range of motion. No edema and no tenderness.  Lymphadenopathy: No lymphadenopathy noted, cervical, inguinal or axillary Neuro: Alert. Normal reflexes, muscle tone coordination. No cranial nerve deficit. Skin: 3 linear horizontal sutured lacerations on the anterior lateral left forearm, no active bleeding.   No rash noted. Not diaphoretic. No erythema. No pallor. Psychiatric: Normal mood  and affect. Behavior, judgment, thought content normal.  Lab Results  Component Value Date   WBC 6.4 10/07/2013   HGB 14.1 10/07/2013   HCT 42.2 10/07/2013   MCV 86.3 10/07/2013   PLT 170 10/07/2013   Lab Results  Component Value Date   CREATININE 0.89 10/07/2013   BUN 7 10/07/2013   NA 138 10/07/2013   K 4.8 10/07/2013   CL 101 10/07/2013   CO2 29 10/07/2013    Lab Results  Component Value Date   HGBA1C 5.9* 05/29/2012   Lipid Panel     Component Value Date/Time   CHOL 115 05/30/2012 0500   TRIG 87 05/30/2012 0500   HDL 25* 05/30/2012 0500   CHOLHDL 4.6 05/30/2012 0500   VLDL 17 05/30/2012 0500   LDLCALC 73 05/30/2012 0500       Assessment and plan:   1. Chronic pain syndrome  - traMADol (ULTRAM) 50 MG tablet; Take 2 tablets (100 mg total) by mouth every 12 (twelve) hours as needed for moderate pain.  Dispense: 90 tablet; Refill: 0  2. Essential hypertension  - lisinopril (PRINIVIL,ZESTRIL) 20 MG tablet; Take 1 tablet (20  mg total) by mouth every morning. For high blood pressure  Dispense: 90 tablet; Refill: 3  - POCT glycosylated hemoglobin (Hb A1C) - Urinalysis, Complete  3. Morbid obesity Patient was counseled extensively about nutrition and exercise  4. Major depressive disorder, single episode, moderate  Patient was counseled to continue his followup with psychiatrist and behavioral therapist  Stitch removal done in the clinic today  Return in about 3 months (around 01/12/2014), or if symptoms worsen or fail to improve, for Hemoglobin A1C and Follow up, DM, Follow up HTN, Follow up Pain and comorbidities.  The patient was given clear instructions to go to ER or return to medical center if symptoms don't improve, worsen or new problems develop. The patient verbalized understanding. The patient was told to call to get lab results if they haven't heard anything in the next week.     This note has been created with Education officer, environmental.  Any transcriptional errors are unintentional.    Jeanann Lewandowsky, MD, MHA, FACP, FAAP [facility] [place], [name] [phone]   10/12/2013, 4:11 PM

## 2013-10-12 NOTE — Patient Instructions (Signed)
DASH Eating Plan DASH stands for "Dietary Approaches to Stop Hypertension." The DASH eating plan is a healthy eating plan that has been shown to reduce high blood pressure (hypertension). Additional health benefits may include reducing the risk of type 2 diabetes mellitus, heart disease, and stroke. The DASH eating plan may also help with weight loss. WHAT DO I NEED TO KNOW ABOUT THE DASH EATING PLAN? For the DASH eating plan, you will follow these general guidelines:  Choose foods with a percent daily value for sodium of less than 5% (as listed on the food label).  Use salt-free seasonings or herbs instead of table salt or sea salt.  Check with your health care provider or pharmacist before using salt substitutes.  Eat lower-sodium products, often labeled as "lower sodium" or "no salt added."  Eat fresh foods.  Eat more vegetables, fruits, and low-fat dairy products.  Choose whole grains. Look for the word "whole" as the first word in the ingredient list.  Choose fish and skinless chicken or turkey more often than red meat. Limit fish, poultry, and meat to 6 oz (170 g) each day.  Limit sweets, desserts, sugars, and sugary drinks.  Choose heart-healthy fats.  Limit cheese to 1 oz (28 g) per day.  Eat more home-cooked food and less restaurant, buffet, and fast food.  Limit fried foods.  Cook foods using methods other than frying.  Limit canned vegetables. If you do use them, rinse them well to decrease the sodium.  When eating at a restaurant, ask that your food be prepared with less salt, or no salt if possible. WHAT FOODS CAN I EAT? Seek help from a dietitian for individual calorie needs. Grains Whole grain or whole wheat bread. Brown rice. Whole grain or whole wheat pasta. Quinoa, bulgur, and whole grain cereals. Low-sodium cereals. Corn or whole wheat flour tortillas. Whole grain cornbread. Whole grain crackers. Low-sodium crackers. Vegetables Fresh or frozen vegetables  (raw, steamed, roasted, or grilled). Low-sodium or reduced-sodium tomato and vegetable juices. Low-sodium or reduced-sodium tomato sauce and paste. Low-sodium or reduced-sodium canned vegetables.  Fruits All fresh, canned (in natural juice), or frozen fruits. Meat and Other Protein Products Ground beef (85% or leaner), grass-fed beef, or beef trimmed of fat. Skinless chicken or turkey. Ground chicken or turkey. Pork trimmed of fat. All fish and seafood. Eggs. Dried beans, peas, or lentils. Unsalted nuts and seeds. Unsalted canned beans. Dairy Low-fat dairy products, such as skim or 1% milk, 2% or reduced-fat cheeses, low-fat ricotta or cottage cheese, or plain low-fat yogurt. Low-sodium or reduced-sodium cheeses. Fats and Oils Tub margarines without trans fats. Light or reduced-fat mayonnaise and salad dressings (reduced sodium). Avocado. Safflower, olive, or canola oils. Natural peanut or almond butter. Other Unsalted popcorn and pretzels. The items listed above may not be a complete list of recommended foods or beverages. Contact your dietitian for more options. WHAT FOODS ARE NOT RECOMMENDED? Grains White bread. White pasta. White rice. Refined cornbread. Bagels and croissants. Crackers that contain trans fat. Vegetables Creamed or fried vegetables. Vegetables in a cheese sauce. Regular canned vegetables. Regular canned tomato sauce and paste. Regular tomato and vegetable juices. Fruits Dried fruits. Canned fruit in light or heavy syrup. Fruit juice. Meat and Other Protein Products Fatty cuts of meat. Ribs, chicken wings, bacon, sausage, bologna, salami, chitterlings, fatback, hot dogs, bratwurst, and packaged luncheon meats. Salted nuts and seeds. Canned beans with salt. Dairy Whole or 2% milk, cream, half-and-half, and cream cheese. Whole-fat or sweetened yogurt. Full-fat   cheeses or blue cheese. Nondairy creamers and whipped toppings. Processed cheese, cheese spreads, or cheese  curds. Condiments Onion and garlic salt, seasoned salt, table salt, and sea salt. Canned and packaged gravies. Worcestershire sauce. Tartar sauce. Barbecue sauce. Teriyaki sauce. Soy sauce, including reduced sodium. Steak sauce. Fish sauce. Oyster sauce. Cocktail sauce. Horseradish. Ketchup and mustard. Meat flavorings and tenderizers. Bouillon cubes. Hot sauce. Tabasco sauce. Marinades. Taco seasonings. Relishes. Fats and Oils Butter, stick margarine, lard, shortening, ghee, and bacon fat. Coconut, palm kernel, or palm oils. Regular salad dressings. Other Pickles and olives. Salted popcorn and pretzels. The items listed above may not be a complete list of foods and beverages to avoid. Contact your dietitian for more information. WHERE CAN I FIND MORE INFORMATION? National Heart, Lung, and Blood Institute: www.nhlbi.nih.gov/health/health-topics/topics/dash/ Document Released: 02/07/2011 Document Revised: 07/05/2013 Document Reviewed: 12/23/2012 ExitCare Patient Information 2015 [employer]. This information is not intended to replace advice given to you by your health care provider. Make sure you discuss any questions you have with your health care provider. Hypertension Hypertension, commonly called high blood pressure, is when the force of blood pumping through your arteries is too strong. Your arteries are the blood vessels that carry blood from your heart throughout your body. A blood pressure reading consists of a higher number over a lower number, such as 110/72. The higher number (systolic) is the pressure inside your arteries when your heart pumps. The lower number (diastolic) is the pressure inside your arteries when your heart relaxes. Ideally you want your blood pressure below 120/80. Hypertension forces your heart to work harder to pump blood. Your arteries may become narrow or stiff. Having hypertension puts you at risk for heart disease, stroke, and other problems.  RISK  FACTORS Some risk factors for high blood pressure are controllable. Others are not.  Risk factors you cannot control include:   Race. You may be at higher risk if you are African American.  Age. Risk increases with age.  Gender. Men are at higher risk than women before age 45 years. After age 65, women are at higher risk than men. Risk factors you can control include:  Not getting enough exercise or physical activity.  Being overweight.  Getting too much fat, sugar, calories, or salt in your diet.  Drinking too much alcohol. SIGNS AND SYMPTOMS Hypertension does not usually cause signs or symptoms. Extremely high blood pressure (hypertensive crisis) may cause headache, anxiety, shortness of breath, and nosebleed. DIAGNOSIS  To check if you have hypertension, your health care provider will measure your blood pressure while you are seated, with your arm held at the level of your heart. It should be measured at least twice using the same arm. Certain conditions can cause a difference in blood pressure between your right and left arms. A blood pressure reading that is higher than normal on one occasion does not mean that you need treatment. If one blood pressure reading is high, ask your health care provider about having it checked again. TREATMENT  Treating high blood pressure includes making lifestyle changes and possibly taking medicine. Living a healthy lifestyle can help lower high blood pressure. You may need to change some of your habits. Lifestyle changes may include:  Following the DASH diet. This diet is high in fruits, vegetables, and whole grains. It is low in salt, red meat, and added sugars.  Getting at least 2 hours of brisk physical activity every week.  Losing weight if necessary.  Not smoking.  Limiting   alcoholic beverages.  Learning ways to reduce stress. If lifestyle changes are not enough to get your blood pressure under control, your health care provider may  prescribe medicine. You may need to take more than one. Work closely with your health care provider to understand the risks and benefits. HOME CARE INSTRUCTIONS  Have your blood pressure rechecked as directed by your health care provider.   Take medicines only as directed by your health care provider. Follow the directions carefully. Blood pressure medicines must be taken as prescribed. The medicine does not work as well when you skip doses. Skipping doses also puts you at risk for problems.   Do not smoke.   Monitor your blood pressure at home as directed by your health care provider. SEEK MEDICAL CARE IF:   You think you are having a reaction to medicines taken.  You have recurrent headaches or feel dizzy.  You have swelling in your ankles.  You have trouble with your vision. SEEK IMMEDIATE MEDICAL CARE IF:  You develop a severe headache or confusion.  You have unusual weakness, numbness, or feel faint.  You have severe chest or abdominal pain.  You vomit repeatedly.  You have trouble breathing. MAKE SURE YOU:   Understand these instructions.  Will watch your condition.  Will get help right away if you are not doing well or get worse. Document Released: 02/18/2005 Document Revised: 07/05/2013 Document Reviewed: 12/11/2012 ExitCare Patient Information 2015 [employer]. This information is not intended to replace advice given to you by your health care provider. Make sure you discuss any questions you have with your health care provider.  

## 2013-10-12 NOTE — Progress Notes (Signed)
HFU Pt was in the ED with HTN and severe lower back pain. Pt suffers for depression and pt cut himself and had to get stiches in his arm in 3 places.

## 2013-10-13 LAB — URINALYSIS, COMPLETE
Bacteria, UA: NONE SEEN
CASTS: NONE SEEN
Crystals: NONE SEEN
Glucose, UA: NEGATIVE mg/dL
HGB URINE DIPSTICK: NEGATIVE
Leukocytes, UA: NEGATIVE
NITRITE: NEGATIVE
PH: 5.5 (ref 5.0–8.0)
PROTEIN: NEGATIVE mg/dL
Specific Gravity, Urine: >1.03 — ABNORMAL HIGH (ref 1.005–1.030)
Squamous Epithelial / LPF: NONE SEEN
Urobilinogen, UA: 1 mg/dL (ref 0.0–1.0)

## 2013-10-15 NOTE — ED Provider Notes (Signed)
48 y.o. Male requesting pain medicine refill.  Also complains of chronic back pain and rectal bleeding. Patient appears hemodynamically stable and no focal neuro deficits.   I performed a history and physical examination of Travis [name] and discussed his management with Dr. Katrinka BlazingSmith.  I agree with the history, physical, assessment, and plan of care, with the following exceptions: None  I was present for the following procedures: None Time Spent in Critical Care of the patient: None Time spent in discussions with the patient and family: 7  Travis [name]    Travis Quarryanielle [name] Shahira Fiske, MD 10/15/13 1019

## 2013-10-20 ENCOUNTER — Telehealth: Payer: Self-pay | Admitting: Emergency Medicine

## 2013-10-20 ENCOUNTER — Telehealth: Payer: Self-pay | Admitting: Internal Medicine

## 2013-10-20 NOTE — Telephone Encounter (Signed)
Message copied by Darlis LoanSMITH, Amaar Oshita D on Wed Oct 20, 2013  3:36 PM ------      Message from: Jeanann LewandowskyJEGEDE, OLUGBEMIGA E      Created: Wed Oct 20, 2013 10:59 AM       Please inform patient that his urine analysis shows mild dehydration otherwise normal, advised him to drink more water ------

## 2013-10-20 NOTE — Telephone Encounter (Signed)
Left message for pt to call when message received 

## 2013-10-20 NOTE — Telephone Encounter (Signed)
Pt missed call from office in regards to lab results. Pls contact pt.

## 2013-10-26 ENCOUNTER — Telehealth: Payer: Self-pay | Admitting: *Deleted

## 2013-10-26 ENCOUNTER — Telehealth: Payer: Self-pay | Admitting: Internal Medicine

## 2013-10-26 MED ORDER — CLONIDINE HCL 0.3 MG PO TABS: 0.3000 mg | ORAL_TABLET | Freq: Every day | ORAL | Status: DC

## 2013-10-26 NOTE — Telephone Encounter (Signed)
Pt. Calling to request a refill for traMADol (ULTRAM) 50 MG tablet, pt. States that he takes 8 pills a day, on a 24 hour period. Pt. States that he is in a lot of pain and has to take them. Pt also would like a refill for  cloNIDine (CATAPRES) 0.3 MG tablet. Please f/u with pt.

## 2013-10-26 NOTE — Telephone Encounter (Signed)
Pt requested Clonidine. Rx Clonidine 0.3mg  was e-scribe to [facility]

## 2013-11-04 ENCOUNTER — Ambulatory Visit: Payer: No Typology Code available for payment source | Attending: Internal Medicine | Admitting: Internal Medicine

## 2013-11-04 ENCOUNTER — Encounter: Payer: Self-pay | Admitting: Internal Medicine

## 2013-11-04 VITALS — BP 127/93 | HR 93 | Temp 98.3°F | Resp 16 | Ht 72.0 in | Wt 291.0 lb

## 2013-11-04 DIAGNOSIS — G894 Chronic pain syndrome: Secondary | ICD-10-CM

## 2013-11-04 DIAGNOSIS — I10 Essential (primary) hypertension: Secondary | ICD-10-CM | POA: Insufficient documentation

## 2013-11-04 DIAGNOSIS — M545 Low back pain, unspecified: Secondary | ICD-10-CM | POA: Insufficient documentation

## 2013-11-04 DIAGNOSIS — E669 Obesity, unspecified: Secondary | ICD-10-CM | POA: Insufficient documentation

## 2013-11-04 DIAGNOSIS — F17200 Nicotine dependence, unspecified, uncomplicated: Secondary | ICD-10-CM | POA: Insufficient documentation

## 2013-11-04 DIAGNOSIS — Z79899 Other long term (current) drug therapy: Secondary | ICD-10-CM | POA: Insufficient documentation

## 2013-11-04 DIAGNOSIS — F1910 Other psychoactive substance abuse, uncomplicated: Secondary | ICD-10-CM | POA: Insufficient documentation

## 2013-11-04 DIAGNOSIS — G8929 Other chronic pain: Secondary | ICD-10-CM | POA: Insufficient documentation

## 2013-11-04 DIAGNOSIS — F411 Generalized anxiety disorder: Secondary | ICD-10-CM | POA: Insufficient documentation

## 2013-11-04 MED ORDER — TRAMADOL HCL 50 MG PO TABS: 100.0000 mg | ORAL_TABLET | Freq: Two times a day (BID) | ORAL | Status: DC | PRN

## 2013-11-04 NOTE — Progress Notes (Signed)
MRN: [id number] Name: Travis [name]  Sex: male Age: 48 y.o. DOB: November 19, 1965  Allergies: Blueberry flavor; Other; Penicillins; Robaxin; and Toradol  Chief Complaint  Patient presents with  . Follow-up  . Back Pain    chronic    HPI: Patient is 48 y.o. male who was seen by Dr. Hyman Hopes last month, patient reports history of chronic lower back pain, he takes tramadol for pain as per patient he got the prescription 2 pills Q. 12 hourly and he ran out because as per patient he was taking every 6 hourly in the past, I have advised patient to take the medication as prescribed by his PCP, is requesting refill on the medication, as per patient he was seen a pain management in the past and is going to try to see a pain management.  Past Medical History  Diagnosis Date  . Hypertension   . Anxiety   . Tachycardia - pulse   . Degenerative disk disease   . Spinal stenosis   . Degenerative disk disease   . Tobacco abuse   . Obesity   . Polysubstance abuse   . Chest pain     Hospital, March, 2014, negative enzymes, patient refused in-hospital  stress test, patient canceled outpatient stress test    History reviewed. No pertinent past surgical history.    Medication List       This list is accurate as of: 11/04/13  3:33 PM.  Always use your most recent med list.               bacitracin ointment  Apply topically 2 (two) times daily. For wound care     clindamycin 300 MG capsule  Commonly known as:  CLEOCIN  Take 1 capsule (300 mg total) by mouth every 8 (eight) hours. For infection     cloNIDine 0.3 MG tablet  Commonly known as:  CATAPRES  Take 1 tablet (0.3 mg total) by mouth daily. For HTN     FLUoxetine 10 MG capsule  Commonly known as:  PROZAC  Take 1 capsule (10 mg total) by mouth daily. For depression     hydrOXYzine 25 MG tablet  Commonly known as:  ATARAX/VISTARIL  Take 1 tablet (25 mg) three times daily as needed & 2 tablets (50 mg) at bedtime: For anxiety/tension       lisinopril 20 MG tablet  Commonly known as:  PRINIVIL,ZESTRIL  Take 1 tablet (20 mg total) by mouth every morning. For high blood pressure     metoprolol 50 MG tablet  Commonly known as:  LOPRESSOR  Take 1 tablet (50 mg total) by mouth 2 (two) times daily. For high blood pressure     ranitidine 75 MG tablet  Commonly known as:  ZANTAC  Take 1 tablet (75 mg total) by mouth daily as needed for heartburn.     traMADol 50 MG tablet  Commonly known as:  ULTRAM  Take 2 tablets (100 mg total) by mouth every 12 (twelve) hours as needed for moderate pain.        Meds ordered this encounter  Medications  . traMADol (ULTRAM) 50 MG tablet    Sig: Take 2 tablets (100 mg total) by mouth every 12 (twelve) hours as needed for moderate pain.    Dispense:  90 tablet    Refill:  0    Immunization History  Administered Date(s) Administered  . Pneumococcal Polysaccharide-23 10/27/2012  . Tdap 02/20/2012    Family History  Problem Relation  Age of Onset  . Stroke Mother   . Hypertension Mother   . Stroke Father   . Hypertension Father   . Dementia Father   . Diabetes Father   . Heart disease Father   . Cancer Sister     brain  . Heart disease Brother     History  Substance Use Topics  . Smoking status: Current Every Day Smoker -- 0.50 packs/day    Types: Cigarettes  . Smokeless tobacco: Never Used  . Alcohol Use: No    Review of Systems   As noted in HPI  Filed Vitals:   11/04/13 1508  BP: 127/93  Pulse: 93  Temp: 98.3 F (36.8 C)  Resp: 16    Physical Exam  Physical Exam  Constitutional: No distress.  Cardiovascular: Normal rate and regular rhythm.   Pulmonary/Chest: Breath sounds normal. No respiratory distress. He has no wheezes.  Musculoskeletal:  Some lower lumbar spinal and paraspinal tenderness.    CBC    Component Value Date/Time   WBC 6.4 10/07/2013 1408   RBC 4.89 10/07/2013 1408   HGB 14.1 10/07/2013 1408   HCT 42.2 10/07/2013 1408   PLT 170  10/07/2013 1408   MCV 86.3 10/07/2013 1408   LYMPHSABS 1.9 10/07/2013 1408   MONOABS 0.4 10/07/2013 1408   EOSABS 0.2 10/07/2013 1408   BASOSABS 0.0 10/07/2013 1408    CMP     Component Value Date/Time   NA 138 10/07/2013 1408   K 4.8 10/07/2013 1408   CL 101 10/07/2013 1408   CO2 29 10/07/2013 1408   GLUCOSE 179* 10/07/2013 1408   BUN 7 10/07/2013 1408   CREATININE 0.89 10/07/2013 1408   CREATININE 0.89 07/27/2013 1440   CALCIUM 8.8 10/07/2013 1408   PROT 7.4 10/07/2013 1408   ALBUMIN 3.4* 10/07/2013 1408   AST 24 10/07/2013 1408   ALT 17 10/07/2013 1408   ALKPHOS 124* 10/07/2013 1408   BILITOT 0.4 10/07/2013 1408   GFRNONAA >90 10/07/2013 1408   GFRAA >90 10/07/2013 1408    Lab Results  Component Value Date/Time   CHOL 115 05/30/2012  5:00 AM    No components found with this basename: hga1c    Lab Results  Component Value Date/Time   AST 24 10/07/2013  2:08 PM    Assessment and Plan  Chronic pain syndrome - Plan: I have given patient refill on traMADol (ULTRAM) 50 MG tablet and counseled to take the medication as prescribed he'll followup her with his PCP for continuity  care of care.  Follow up as scheduled.  Doris Cheadle, MD

## 2013-11-04 NOTE — Progress Notes (Signed)
Pt here to f/u with chronic back pain with pain medications States the pain is constant effecting sleep Ran out of Tramadol last week Refused flu vaccine

## 2013-11-13 ENCOUNTER — Encounter (HOSPITAL_COMMUNITY): Payer: Self-pay | Admitting: Emergency Medicine

## 2013-11-13 ENCOUNTER — Emergency Department (HOSPITAL_COMMUNITY)
Admission: EM | Admit: 2013-11-13 | Discharge: 2013-11-13 | Disposition: A | Discharge status: Home / Self Care | Payer: No Typology Code available for payment source | Attending: Emergency Medicine | Admitting: Emergency Medicine

## 2013-11-13 DIAGNOSIS — R631 Polydipsia: Secondary | ICD-10-CM | POA: Insufficient documentation

## 2013-11-13 DIAGNOSIS — Z880 Allergy status to penicillin: Secondary | ICD-10-CM | POA: Insufficient documentation

## 2013-11-13 DIAGNOSIS — R630 Anorexia: Secondary | ICD-10-CM | POA: Insufficient documentation

## 2013-11-13 DIAGNOSIS — I160 Hypertensive urgency: Secondary | ICD-10-CM

## 2013-11-13 DIAGNOSIS — F411 Generalized anxiety disorder: Secondary | ICD-10-CM | POA: Insufficient documentation

## 2013-11-13 DIAGNOSIS — Z79899 Other long term (current) drug therapy: Secondary | ICD-10-CM | POA: Insufficient documentation

## 2013-11-13 DIAGNOSIS — I10 Essential (primary) hypertension: Secondary | ICD-10-CM | POA: Insufficient documentation

## 2013-11-13 DIAGNOSIS — F17200 Nicotine dependence, unspecified, uncomplicated: Secondary | ICD-10-CM | POA: Insufficient documentation

## 2013-11-13 DIAGNOSIS — Z8739 Personal history of other diseases of the musculoskeletal system and connective tissue: Secondary | ICD-10-CM | POA: Insufficient documentation

## 2013-11-13 DIAGNOSIS — E669 Obesity, unspecified: Secondary | ICD-10-CM | POA: Insufficient documentation

## 2013-11-13 DIAGNOSIS — Z792 Long term (current) use of antibiotics: Secondary | ICD-10-CM | POA: Insufficient documentation

## 2013-11-13 NOTE — ED Notes (Signed)
He monitors his b/p at home and states he has felt "tired" for past three days; and he got b/p earlier today of ~180/130.  He states he took an extra 1/2 tab of his Clonidine (1.5mg ).  He also cites much recent stress.

## 2013-11-13 NOTE — Discharge Instructions (Signed)
Start taking lisinopril  TWICE a day. Schedule an appointment to see your PCP as soon as possible to have your blood pressure medications adjusted.

## 2013-11-13 NOTE — ED Provider Notes (Signed)
CSN: [id number]     Arrival date & time 11/13/13  1601 History   First MD Initiated Contact with Patient 11/13/13 1649     Chief Complaint  Patient presents with  . Hypertension     (Consider location/radiation/quality/duration/timing/severity/associated sxs/prior Treatment) Patient is a 48 y.o. male presenting with hypertension.  Hypertension Pertinent negatives include no chest pain, headaches, nausea or vomiting.   Pt is a 48 y/o male w/ PMHx of HTN, anxiety, and depression who presents to the ED for elevated BP x 3 days. His BP this morning was 188/167, he then took half a tab of his 0.3mg  clonidine and came to the ED. He reports compliance with his BP medications: clonidine 0.3mg  BID, lisinopril 20qAM, and metoprolol  BID.  He states that his BP is usually well controlled and ranges from the 120-130/s/90. He admits to being under a lot of stress 2/2 applying for disability and family issues. He denies HA, pressure behind his eyes, N/V, recent illness, changes in vision, and SOB. He does not have hx of kidney problems. He follows w/ [facility] for depression and was recently on prozac but quit after 1 week due to side effects. Last dose of prozac was over 1 month ago. He has chronic pain but he reports pain is at baseline.   Past Medical History  Diagnosis Date  . Hypertension   . Anxiety   . Tachycardia - pulse   . Degenerative disk disease   . Spinal stenosis   . Degenerative disk disease   . Tobacco abuse   . Obesity   . Polysubstance abuse   . Chest pain     Hospital, March, 2014, negative enzymes, patient refused in-hospital  stress test, patient canceled outpatient stress test   No past surgical history on file. Family History  Problem Relation Age of Onset  . Stroke Mother   . Hypertension Mother   . Stroke Father   . Hypertension Father   . Dementia Father   . Diabetes Father   . Heart disease Father   . Cancer Sister     brain  . Heart disease Brother     History  Substance Use Topics  . Smoking status: Current Every Day Smoker -- 0.50 packs/day    Types: Cigarettes  . Smokeless tobacco: Never Used  . Alcohol Use: No    Review of Systems  Constitutional: Positive for appetite change (decreased).  Eyes: Negative for pain and visual disturbance.  Respiratory: Negative for shortness of breath.   Cardiovascular: Negative for chest pain and palpitations.  Gastrointestinal: Negative for nausea and vomiting.  Endocrine: Positive for polydipsia.  Neurological: Negative for headaches.      Allergies  Blueberry flavor; Other; Penicillins; Robaxin; and Toradol  Home Medications   Prior to Admission medications   Medication Sig Start Date End Date Taking? Authorizing Provider  bacitracin ointment Apply topically 2 (two) times daily. For wound care 09/30/13   Sanjuana Kava, NP  clindamycin (CLEOCIN) 300 MG capsule Take 1 capsule (300 mg total) by mouth every 8 (eight) hours. For infection 09/30/13   Sanjuana Kava, NP  cloNIDine (CATAPRES) 0.3 MG tablet Take 1 tablet (0.3 mg total) by mouth daily. For HTN 10/26/13   Quentin Angst, MD  FLUoxetine (PROZAC) 10 MG capsule Take 1 capsule (10 mg total) by mouth daily. For depression 09/30/13   Sanjuana Kava, NP  hydrOXYzine (ATARAX/VISTARIL) 25 MG tablet Take 1 tablet (25 mg) three times daily as needed &  2 tablets (50 mg) at bedtime: For anxiety/tension 09/30/13   Sanjuana Kava, NP  lisinopril (PRINIVIL,ZESTRIL) 20 MG tablet Take 1 tablet (20 mg total) by mouth every morning. For high blood pressure 10/12/13   Quentin Angst, MD  metoprolol (LOPRESSOR) 50 MG tablet Take 1 tablet (50 mg total) by mouth 2 (two) times daily. For high blood pressure 09/30/13   Sanjuana Kava, NP  ranitidine (ZANTAC) 75 MG tablet Take 1 tablet (75 mg total) by mouth daily as needed for heartburn. 09/30/13   Sanjuana Kava, NP  traMADol (ULTRAM) 50 MG tablet Take 2 tablets (100 mg total) by mouth every 12 (twelve)  hours as needed for moderate pain. 11/04/13   Doris Cheadle, MD   BP 145/102  Pulse 65  Temp(Src) 98.4 F (36.9 C) (Oral)  Resp 18  SpO2 98% Physical Exam  Constitutional: He appears well-developed and well-nourished. No distress.  Cardiovascular: Normal rate and regular rhythm.   No murmur heard. Pulmonary/Chest: Effort normal and breath sounds normal. He has no wheezes.  Abdominal: Soft. Bowel sounds are normal.  Psychiatric: He has a normal mood and affect.    ED Course  Procedures (including critical care time) Labs Review Labs Reviewed  I-STAT CHEM 8, ED     MDM   Final diagnoses:  Asymptomatic hypertensive urgency    Pt is a 48 y/o male who presents to the ED for hypertensive urgency. BPs on admission to ED were elevated but during end of exam pt's BP was rechecked and was 138/90. Pt states that he feels more relaxed being in the ED. DDx include medication non-compliance, adrenal insufficiency, renal stenosis, stress induced, pain induced, and although less likely serotonin withdrawal.  Will proceed w/ i-STAT 8 lab and recheck BP q 15 mins  Pt wants to leave, states his elderly mother is outside waiting for him. States that he feels fine and does not want blood labs drawn. BP was rechecked and was 136/98. Instructed patient to take lisinopril  BID and schedule an appt w/ PCP as soon as possible. Pt does not want a prescription for lisinopril, states he has more than enough at home. Will d/c home.     Gara Kroner, MD 11/13/13 [phone]

## 2013-11-14 NOTE — ED Provider Notes (Signed)
Patient left prior to me examining him    Nelia Shi, MD 11/14/13 1028

## 2013-11-16 ENCOUNTER — Telehealth: Payer: Self-pay | Admitting: Internal Medicine

## 2013-11-16 MED ORDER — CLONIDINE HCL 0.3 MG PO TABS: 0.3000 mg | ORAL_TABLET | Freq: Two times a day (BID) | ORAL | Status: DC

## 2013-11-16 NOTE — Telephone Encounter (Signed)
Left message on pt's VM that a one month supply of clonidine has been sent to [facility] pharmacy.  Also, for patient to schedule a one month f/u with PCP. Per provider, Clonidine 0.3 mg BID #60 with no refills e-scribed to [facility] pharmacy. Patient's request that lisinopril be changed to BID from daily was denied by PCP. Message instructed patient to call back to discuss this.

## 2013-11-16 NOTE — Telephone Encounter (Signed)
Second message left for patient that meds are ready for pick up at [facility] pharmacy prior to 6 PM. And for patient to return call to discuss prescriptions.

## 2013-11-16 NOTE — Telephone Encounter (Signed)
Pt. Is calling back again, he states he can't go without his medication past today, if so he would end up at the ED...please f/u with patient

## 2013-11-16 NOTE — Telephone Encounter (Signed)
Pt. Calling to state that he is running out of cloNIDine (CATAPRES) 0.3 MG tablet. Pt. States that the prescription was written wrong due to the fact that he has been taking the medication twice daily instead of once daily for about 10 years. Pt. Would like to get that prescription again for the right quantity. Pt. Also requesting lisinopril (PRINIVIL,ZESTRIL) 20 MG tablet changed to twice a day. Pt. States that he went to the ED and they changed the medication from once a day to twice a day. Please f/u with pt.

## 2013-11-22 ENCOUNTER — Other Ambulatory Visit: Payer: Self-pay | Admitting: Internal Medicine

## 2013-11-23 ENCOUNTER — Encounter: Payer: Self-pay | Admitting: *Deleted

## 2013-11-23 NOTE — Progress Notes (Signed)
Pt called requesting a refill of tramadol. It was too soon to give pt a refill.

## 2013-11-26 ENCOUNTER — Telehealth: Payer: Self-pay | Admitting: Internal Medicine

## 2013-11-26 NOTE — Telephone Encounter (Signed)
Patient calling to request referral to pain clinic. Please follow up.

## 2013-11-29 NOTE — Telephone Encounter (Signed)
Patient calling to request referral to pain clinic. Please follow up.    Please f/u

## 2013-12-01 ENCOUNTER — Other Ambulatory Visit: Payer: Self-pay | Admitting: Internal Medicine

## 2013-12-02 ENCOUNTER — Telehealth: Payer: Self-pay | Admitting: Emergency Medicine

## 2013-12-02 ENCOUNTER — Telehealth: Payer: Self-pay | Admitting: Internal Medicine

## 2013-12-02 NOTE — Telephone Encounter (Signed)
Pt requesting medication refill on Tramadol Last prescribed 11/04/13 #90 Pt requesting refill before weekend Pt also requesting pain clinic referral Dr. Penni HomansKiersten preferred

## 2013-12-02 NOTE — Telephone Encounter (Signed)
Patient needs refill for Tramadol 50 mg. Please f/u with Patient

## 2013-12-03 ENCOUNTER — Other Ambulatory Visit: Payer: Self-pay | Admitting: Emergency Medicine

## 2013-12-03 ENCOUNTER — Telehealth: Payer: Self-pay | Admitting: Emergency Medicine

## 2013-12-03 DIAGNOSIS — G894 Chronic pain syndrome: Secondary | ICD-10-CM

## 2013-12-03 MED ORDER — TRAMADOL HCL 50 MG PO TABS: 100.0000 mg | ORAL_TABLET | Freq: Two times a day (BID) | ORAL | Status: DC | PRN

## 2013-12-03 NOTE — Telephone Encounter (Signed)
Pt. Called again to check on the status of med refill for traMADol (ULTRAM) 50 MG tablet. Please f/u with pt.

## 2013-12-03 NOTE — Telephone Encounter (Signed)
I will recommend to check with his PCP for any more refills.

## 2013-12-03 NOTE — Telephone Encounter (Signed)
Pt informed he can pick medication Tramadol at pharmacy  Pain clinic referral placer per Dr.Jegede

## 2013-12-03 NOTE — Telephone Encounter (Signed)
[facility]

## 2013-12-03 NOTE — Telephone Encounter (Signed)
Patient calling to follow up on refill request and pain clinic referral. Patient transferred to nurse line. Please assist.

## 2013-12-14 ENCOUNTER — Encounter: Payer: Self-pay | Admitting: *Deleted

## 2013-12-14 ENCOUNTER — Telehealth: Payer: Self-pay | Admitting: Internal Medicine

## 2013-12-14 NOTE — Progress Notes (Signed)
Pt called stating that he fell and he is in a lot of pain. I he got the next available appointment with Dr. Hyman HopesJegede. I instructed him to go to the ED or the UC to have him checked out in case there was something serious.

## 2013-12-14 NOTE — Telephone Encounter (Signed)
Pt. Called stating that he fell this morning and states that his whole upper body is in pain, pt. States that the current pain medication he is on is not  Helping pain. Please f/u with pt.

## 2013-12-16 ENCOUNTER — Emergency Department (HOSPITAL_COMMUNITY)
Admission: EM | Admit: 2013-12-16 | Discharge: 2013-12-16 | Disposition: A | Discharge status: Home / Self Care | Payer: No Typology Code available for payment source | Attending: Emergency Medicine | Admitting: Emergency Medicine

## 2013-12-16 ENCOUNTER — Encounter (HOSPITAL_COMMUNITY): Payer: Self-pay | Admitting: Emergency Medicine

## 2013-12-16 ENCOUNTER — Emergency Department (HOSPITAL_COMMUNITY): Payer: No Typology Code available for payment source

## 2013-12-16 DIAGNOSIS — Z720 Tobacco use: Secondary | ICD-10-CM | POA: Insufficient documentation

## 2013-12-16 DIAGNOSIS — Y929 Unspecified place or not applicable: Secondary | ICD-10-CM | POA: Insufficient documentation

## 2013-12-16 DIAGNOSIS — Z880 Allergy status to penicillin: Secondary | ICD-10-CM | POA: Insufficient documentation

## 2013-12-16 DIAGNOSIS — Z8739 Personal history of other diseases of the musculoskeletal system and connective tissue: Secondary | ICD-10-CM | POA: Insufficient documentation

## 2013-12-16 DIAGNOSIS — L089 Local infection of the skin and subcutaneous tissue, unspecified: Secondary | ICD-10-CM | POA: Insufficient documentation

## 2013-12-16 DIAGNOSIS — R42 Dizziness and giddiness: Secondary | ICD-10-CM

## 2013-12-16 DIAGNOSIS — E669 Obesity, unspecified: Secondary | ICD-10-CM | POA: Insufficient documentation

## 2013-12-16 DIAGNOSIS — Y939 Activity, unspecified: Secondary | ICD-10-CM | POA: Insufficient documentation

## 2013-12-16 DIAGNOSIS — I10 Essential (primary) hypertension: Secondary | ICD-10-CM | POA: Insufficient documentation

## 2013-12-16 DIAGNOSIS — F419 Anxiety disorder, unspecified: Secondary | ICD-10-CM | POA: Insufficient documentation

## 2013-12-16 DIAGNOSIS — S40011A Contusion of right shoulder, initial encounter: Secondary | ICD-10-CM

## 2013-12-16 DIAGNOSIS — Z79899 Other long term (current) drug therapy: Secondary | ICD-10-CM | POA: Insufficient documentation

## 2013-12-16 DIAGNOSIS — W1830XA Fall on same level, unspecified, initial encounter: Secondary | ICD-10-CM | POA: Insufficient documentation

## 2013-12-16 LAB — COMPREHENSIVE METABOLIC PANEL
ALBUMIN: 3.5 g/dL (ref 3.5–5.2)
ALT: 28 U/L (ref 0–53)
AST: 32 U/L (ref 0–37)
Alkaline Phosphatase: 98 U/L (ref 39–117)
Anion gap: 11 (ref 5–15)
BUN: 5 mg/dL — ABNORMAL LOW (ref 6–23)
CO2: 27 meq/L (ref 19–32)
Calcium: 9.1 mg/dL (ref 8.4–10.5)
Chloride: 100 mEq/L (ref 96–112)
Creatinine, Ser: 0.78 mg/dL (ref 0.50–1.35)
GFR calc Af Amer: >90 mL/min (ref >90)
GFR calc non Af Amer: >90 mL/min (ref >90)
Glucose, Bld: 172 mg/dL — ABNORMAL HIGH (ref 70–99)
Potassium: 3.8 mEq/L (ref 3.7–5.3)
SODIUM: 138 meq/L (ref 137–147)
TOTAL PROTEIN: 7.7 g/dL (ref 6.0–8.3)
Total Bilirubin: 0.4 mg/dL (ref 0.3–1.2)

## 2013-12-16 LAB — CBC WITH DIFFERENTIAL/PLATELET
Basophils Absolute: 0 10*3/uL (ref 0.0–0.1)
Basophils Relative: 0 % (ref 0–1)
EOS ABS: 0.2 10*3/uL (ref 0.0–0.7)
Eosinophils Relative: 2 % (ref 0–5)
HCT: 45.8 % (ref 39.0–52.0)
Hemoglobin: 15.4 g/dL (ref 13.0–17.0)
LYMPHS PCT: 31 % (ref 12–46)
Lymphs Abs: 2.2 10*3/uL (ref 0.7–4.0)
MCH: 28.3 pg (ref 26.0–34.0)
MCHC: 33.6 g/dL (ref 30.0–36.0)
MCV: 84.2 fL (ref 78.0–100.0)
Monocytes Absolute: 0.6 10*3/uL (ref 0.1–1.0)
Monocytes Relative: 8 % (ref 3–12)
Neutro Abs: 4.3 10*3/uL (ref 1.7–7.7)
Neutrophils Relative %: 59 % (ref 43–77)
Platelets: 181 10*3/uL (ref 150–400)
RBC: 5.44 MIL/uL (ref 4.22–5.81)
RDW: 12.7 % (ref 11.5–15.5)
WBC: 7.2 10*3/uL (ref 4.0–10.5)

## 2013-12-16 LAB — CBG MONITORING, ED: GLUCOSE-CAPILLARY: 131 mg/dL — AB (ref 70–99)

## 2013-12-16 MED ORDER — HYDROMORPHONE HCL 2 MG/ML IJ SOLN
2.0000 mg | Freq: Once | INTRAMUSCULAR | Status: AC
  Administered 2013-12-16: 2 mg via INTRAMUSCULAR
  Filled 2013-12-16: qty 1

## 2013-12-16 MED ORDER — TRAMADOL HCL 50 MG PO TABS: 100.0000 mg | ORAL_TABLET | Freq: Two times a day (BID) | ORAL | Status: DC | PRN

## 2013-12-16 MED ORDER — ONDANSETRON HCL 4 MG/2ML IJ SOLN
4.0000 mg | Freq: Once | INTRAMUSCULAR | Status: AC
  Administered 2013-12-16: 4 mg via INTRAMUSCULAR
  Filled 2013-12-16: qty 2

## 2013-12-16 MED ORDER — MUPIROCIN CALCIUM 2 % EX CREA: 1.0000 "application " | TOPICAL_CREAM | Freq: Two times a day (BID) | CUTANEOUS | Status: DC

## 2013-12-16 NOTE — ED Notes (Signed)
Patient transported to CT via stretcher Patient in NAD upon leaving for testing 

## 2013-12-16 NOTE — ED Notes (Signed)
VS updated and patient noted to be hypertensive Patient reports that he takes his Lisinopril and Clonidine at 1900 Will inform EDP

## 2013-12-16 NOTE — ED Provider Notes (Signed)
CSN: [id number]     Arrival date & time 12/16/13  1523 History   First MD Initiated Contact with Patient 12/16/13 1633     No chief complaint on file.    (Consider location/radiation/quality/duration/timing/severity/associated sxs/prior Treatment) Patient is a 48 y.o. male presenting with dizziness. The history is provided by the patient. No language interpreter was used.  Dizziness Quality:  Lightheadedness Severity:  Moderate Onset quality:  Gradual Duration:  1 day Timing:  Constant Progression:  Improving Chronicity:  New Context: not with loss of consciousness   Relieved by:  Nothing Worsened by:  Nothing tried Ineffective treatments:  None tried Associated symptoms: no headaches   Pt reports   Past Medical History  Diagnosis Date  . Hypertension   . Anxiety   . Tachycardia - pulse   . Degenerative disk disease   . Spinal stenosis   . Degenerative disk disease   . Tobacco abuse   . Obesity   . Polysubstance abuse   . Chest pain     Hospital, March, 2014, negative enzymes, patient refused in-hospital  stress test, patient canceled outpatient stress test   History reviewed. No pertinent past surgical history. Family History  Problem Relation Age of Onset  . Stroke Mother   . Hypertension Mother   . Stroke Father   . Hypertension Father   . Dementia Father   . Diabetes Father   . Heart disease Father   . Cancer Sister     brain  . Heart disease Brother    History  Substance Use Topics  . Smoking status: Current Every Day Smoker -- 0.50 packs/day    Types: Cigarettes  . Smokeless tobacco: Never Used  . Alcohol Use: No    Review of Systems  Neurological: Positive for dizziness. Negative for headaches.  All other systems reviewed and are negative.     Allergies  Blueberry flavor; Other; Penicillins; Robaxin; Flexeril; and Toradol  Home Medications   Prior to Admission medications   Medication Sig Start Date End Date Taking? Authorizing Provider   cloNIDine (CATAPRES) 0.3 MG tablet Take 1 tablet (0.3 mg total) by mouth 2 (two) times daily. For HTN 11/16/13  Yes Quentin Angstlugbemiga E Jegede, MD  diphenhydrAMINE (SOMINEX) 25 MG tablet Take 25 mg by mouth at bedtime as needed for allergies or sleep (allergic reaction).   Yes Historical Provider, MD  lisinopril (PRINIVIL,ZESTRIL) 20 MG tablet Take 1 tablet (20 mg total) by mouth every morning. For high blood pressure 10/12/13  Yes Olugbemiga E Hyman HopesJegede, MD  metoprolol (LOPRESSOR) 50 MG tablet Take 1 tablet (50 mg total) by mouth 2 (two) times daily. For high blood pressure 09/30/13  Yes Sanjuana KavaAgnes I Nwoko, NP  ranitidine (ZANTAC) 75 MG tablet Take 75 mg by mouth daily as needed for heartburn (heartburn).   Yes Historical Provider, MD  traMADol (ULTRAM) 50 MG tablet Take 100 mg by mouth every 12 (twelve) hours as needed for severe pain (pain).   Yes Historical Provider, MD   BP 150/99  Pulse 97  Temp(Src) 97.8 F (36.6 C) (Oral)  Resp 20  SpO2 95% Physical Exam  Nursing note and vitals reviewed. Constitutional: He is oriented to person, place, and time. He appears well-developed and well-nourished.  HENT:  Head: Normocephalic and atraumatic.  Right Ear: External ear normal.  Left Ear: External ear normal.  Mouth/Throat: Oropharynx is clear and moist.  Eyes: Conjunctivae are normal. Pupils are equal, round, and reactive to light.  Neck: Normal range of motion. Neck  supple.  Cardiovascular: Normal rate and normal heart sounds.   Pulmonary/Chest: Effort normal and breath sounds normal.  Abdominal: Soft.  Musculoskeletal: Normal range of motion.  Neurological: He is alert and oriented to person, place, and time. He has normal reflexes.  Skin: Rash noted.  Psychiatric: He has a normal mood and affect.    ED Course  Procedures (including critical care time) Labs Review Labs Reviewed  COMPREHENSIVE METABOLIC PANEL - Abnormal; Notable for the following:    Glucose, Bld 172 (*)    BUN 5 (*)    All  other components within normal limits  CBG MONITORING, ED - Abnormal; Notable for the following:    Glucose-Capillary 131 (*)    All other components within normal limits  CBC WITH DIFFERENTIAL    Imaging Review Ct Head Wo Contrast  12/16/2013   CLINICAL DATA:  Dizziness.  EXAM: CT HEAD WITHOUT CONTRAST  TECHNIQUE: Contiguous axial images were obtained from the base of the skull through the vertex without intravenous contrast.  COMPARISON:  None.  FINDINGS: Bony calvarium appears intact. No mass effect or midline shift is noted. Ventricular size is within normal limits. There is no evidence of mass lesion, hemorrhage or acute infarction.  IMPRESSION: Normal head CT.   Electronically Signed   By: Roque LiasJames  Green M.D.   On: 12/16/2013 18:00     EKG Interpretation   Date/Time:  Thursday December 16 2013 15:35:38 EDT Ventricular Rate:  126 PR Interval:  149 QRS Duration: 86 QT Interval:  316 QTC Calculation: 457 R Axis:   -11 Text Interpretation:  Sinus tachycardia Probable left atrial enlargement  Borderline T abnormalities, diffuse leads Baseline wander in lead(s) V1  Confirmed by PICKERING  MD, NATHAN [phone]([id number]) on 12/16/2013 6:11:33 PM      MDM   Ct head is normal.   Pt request pain medication before ct.  Pt reports feeling better.   I don't think pt has had a cva.  Pt has chronic back problems.  I think tingling in feet probably related to back problems.   Pt reports he needs rx for tramadol.  Pt is out and he takes regularly.     Final diagnoses:  Essential hypertension  Skin infection  Contusion of right shoulder, initial encounter  Dizziness        Elson AreasLeslie K Sofia, PA-C 12/17/13 0045

## 2013-12-16 NOTE — ED Notes (Signed)
Pt states he experienced dizziness then fell at about 1800 yesterday, no head injury, c/o left leg numbness/tingling from tip of toes to pelvic area, low back pain, and shoulder pain. Denies loss of control of bowels or bladder.

## 2013-12-16 NOTE — Discharge Instructions (Signed)
Contusion A contusion is a deep bruise. Contusions are the result of an injury that caused bleeding under the skin. The contusion may turn blue, purple, or yellow. Minor injuries will give you a painless contusion, but more severe contusions may stay painful and swollen for a few weeks.  CAUSES  A contusion is usually caused by a blow, trauma, or direct force to an area of the body. SYMPTOMS   Swelling and redness of the injured area.  Bruising of the injured area.  Tenderness and soreness of the injured area.  Pain. DIAGNOSIS  The diagnosis can be made by taking a history and physical exam. An X-ray, CT scan, or MRI may be needed to determine if there were any associated injuries, such as fractures. TREATMENT  Specific treatment will depend on what area of the body was injured. In general, the best treatment for a contusion is resting, icing, elevating, and applying cold compresses to the injured area. Over-the-counter medicines may also be recommended for pain control. Ask your caregiver what the best treatment is for your contusion. HOME CARE INSTRUCTIONS   Put ice on the injured area.  Put ice in a plastic bag.  Place a towel between your skin and the bag.  Leave the ice on for 15-20 minutes, 3-4 times a day, or as directed by your health care provider.  Only take over-the-counter or prescription medicines for pain, discomfort, or fever as directed by your caregiver. Your caregiver may recommend avoiding anti-inflammatory medicines (aspirin, ibuprofen, and naproxen) for 48 hours because these medicines may increase bruising.  Rest the injured area.  If possible, elevate the injured area to reduce swelling. SEEK IMMEDIATE MEDICAL CARE IF:   You have increased bruising or swelling.  You have pain that is getting worse.  Your swelling or pain is not relieved with medicines. MAKE SURE YOU:   Understand these instructions.  Will watch your condition.  Will get help right  away if you are not doing well or get worse. Document Released: 11/28/2004 Document Revised: 02/23/2013 Document Reviewed: 12/24/2010 [facility] Patient Information 2015 [place], [place]. This information is not intended to replace advice given to you by your health care provider. Make sure you discuss any questions you have with your health care provider. Wound Infection A wound infection happens when a type of germ (bacteria) starts growing in the wound. In some cases, this can cause the wound to break open. If cared for properly, the infected wound will heal from the inside to the outside. Wound infections need treatment. CAUSES An infection is caused by bacteria growing in the wound.  SYMPTOMS  Increase in redness, swelling, or pain at the wound site. Increase in drainage at the wound site. Wound or bandage (dressing) starts to smell bad. Fever. Feeling tired or fatigued. Pus draining from the wound. TREATMENT  Your health care provider will prescribe antibiotic medicine. The wound infection should improve within 24 to 48 hours. Any redness around the wound should stop spreading and the wound should be less painful.  HOME CARE INSTRUCTIONS  Only take over-the-counter or prescription medicines for pain, discomfort, or fever as directed by your health care provider. Take your antibiotics as directed. Finish them even if you start to feel better. Gently wash the area with mild soap and water 2 times a day, or as directed. Rinse off the soap. Pat the area dry with a clean towel. Do not rub the wound. This may cause bleeding. Follow your health care provider's instructions for  how often you need to change the dressing. Apply ointment and a dressing to the wound as directed. If the dressing sticks, moisten it with soapy water and gently remove it. Change the bandage right away if it becomes wet, dirty, or develops a bad smell. Take showers. Do not take tub baths, swim, or do anything that may soak  the wound until it is healed. Avoid exercises that make you sweat heavily. Use anti-itch medicine as directed by your health care provider. The wound may itch when it is healing. Do not pick or scratch at the wound. Follow up with your health care provider to get your wound rechecked as directed. SEEK MEDICAL CARE IF: You have an increase in swelling, pain, or redness around the wound. You have an increase in the amount of pus coming from the wound. There is a bad smell coming from the wound. More of the wound breaks open. You have a fever. MAKE SURE YOU:  Understand these instructions. Will watch your condition. Will get help right away if you are not doing well or get worse. Document Released: 11/17/2002 Document Revised: 02/23/2013 Document Reviewed: 06/24/2010 [facility] Patient Information 2015 [place], [place]. This information is not intended to replace advice given to you by your health care provider. Make sure you discuss any questions you have with your health care provider. Dizziness Dizziness is a common problem. It is a feeling of unsteadiness or light-headedness. You may feel like you are about to faint. Dizziness can lead to injury if you stumble or fall. A person of any age group can suffer from dizziness, but dizziness is more common in older adults. CAUSES  Dizziness can be caused by many different things, including: Middle ear problems. Standing for too long. Infections. An allergic reaction. Aging. An emotional response to something, such as the sight of blood. Side effects of medicines. Tiredness. Problems with circulation or blood pressure. Excessive use of alcohol or medicines, or illegal drug use. Breathing too fast (hyperventilation). An irregular heart rhythm (arrhythmia). A low red blood cell count (anemia). Pregnancy. Vomiting, diarrhea, fever, or other illnesses that cause body fluid loss (dehydration). Diseases or conditions such as Parkinson's disease,  high blood pressure (hypertension), diabetes, and thyroid problems. Exposure to extreme heat. DIAGNOSIS  Your health care provider will ask about your symptoms, perform a physical exam, and perform an electrocardiogram (ECG) to record the electrical activity of your heart. Your health care provider may also perform other heart or blood tests to determine the cause of your dizziness. These may include: Transthoracic echocardiogram (TTE). During echocardiography, sound waves are used to evaluate how blood flows through your heart. Transesophageal echocardiogram (TEE). Cardiac monitoring. This allows your health care provider to monitor your heart rate and rhythm in real time. Holter monitor. This is a portable device that records your heartbeat and can help diagnose heart arrhythmias. It allows your health care provider to track your heart activity for several days if needed. Stress tests by exercise or by giving medicine that makes the heart beat faster. TREATMENT  Treatment of dizziness depends on the cause of your symptoms and can vary greatly. HOME CARE INSTRUCTIONS  Drink enough fluids to keep your urine clear or pale yellow. This is especially important in very hot weather. In older adults, it is also important in cold weather. Take your medicine exactly as directed if your dizziness is caused by medicines. When taking blood pressure medicines, it is especially important to get up slowly. Rise slowly from chairs  and steady yourself until you feel okay. In the morning, first sit up on the side of the bed. When you feel okay, stand slowly while holding onto something until you know your balance is fine. Move your legs often if you need to stand in one place for a long time. Tighten and relax your muscles in your legs while standing. Have someone stay with you for 1-2 days if dizziness continues to be a problem. Do this until you feel you are well enough to stay alone. Have the person call your  health care provider if he or she notices changes in you that are concerning. Do not drive or use heavy machinery if you feel dizzy. Do not drink alcohol. SEEK IMMEDIATE MEDICAL CARE IF:  Your dizziness or light-headedness gets worse. You feel nauseous or vomit. You have problems talking, walking, or using your arms, hands, or legs. You feel weak. You are not thinking clearly or you have trouble forming sentences. It may take a friend or family member to notice this. You have chest pain, abdominal pain, shortness of breath, or sweating. Your vision changes. You notice any bleeding. You have side effects from medicine that seems to be getting worse rather than better. MAKE SURE YOU:  Understand these instructions. Will watch your condition. Will get help right away if you are not doing well or get worse. Document Released: 08/14/2000 Document Revised: 02/23/2013 Document Reviewed: 09/07/2010 [facility] Patient Information 2015 [place], [place]. This information is not intended to replace advice given to you by your health care provider. Make sure you discuss any questions you have with your health care provider.

## 2013-12-16 NOTE — ED Notes (Signed)
EDP aware of patient's BP OK to DC patient home--patient to take BP medications once he gets home Patient in NAD upon leaving ED

## 2013-12-19 ENCOUNTER — Emergency Department (HOSPITAL_COMMUNITY): Payer: No Typology Code available for payment source

## 2013-12-19 ENCOUNTER — Encounter (HOSPITAL_COMMUNITY): Payer: Self-pay | Admitting: Emergency Medicine

## 2013-12-19 ENCOUNTER — Emergency Department (HOSPITAL_COMMUNITY): Payer: Self-pay

## 2013-12-19 ENCOUNTER — Emergency Department (HOSPITAL_COMMUNITY)
Admission: EM | Admit: 2013-12-19 | Discharge: 2013-12-19 | Disposition: A | Discharge status: Home / Self Care | Payer: No Typology Code available for payment source | Attending: Emergency Medicine | Admitting: Emergency Medicine

## 2013-12-19 DIAGNOSIS — I10 Essential (primary) hypertension: Secondary | ICD-10-CM | POA: Insufficient documentation

## 2013-12-19 DIAGNOSIS — Z720 Tobacco use: Secondary | ICD-10-CM | POA: Insufficient documentation

## 2013-12-19 DIAGNOSIS — S40011A Contusion of right shoulder, initial encounter: Secondary | ICD-10-CM | POA: Insufficient documentation

## 2013-12-19 DIAGNOSIS — Z765 Malingerer [conscious simulation]: Secondary | ICD-10-CM | POA: Insufficient documentation

## 2013-12-19 DIAGNOSIS — M9973 Connective tissue and disc stenosis of intervertebral foramina of lumbar region: Secondary | ICD-10-CM | POA: Insufficient documentation

## 2013-12-19 DIAGNOSIS — E669 Obesity, unspecified: Secondary | ICD-10-CM | POA: Insufficient documentation

## 2013-12-19 DIAGNOSIS — Y939 Activity, unspecified: Secondary | ICD-10-CM | POA: Insufficient documentation

## 2013-12-19 DIAGNOSIS — Z79899 Other long term (current) drug therapy: Secondary | ICD-10-CM | POA: Insufficient documentation

## 2013-12-19 DIAGNOSIS — M4807 Spinal stenosis, lumbosacral region: Secondary | ICD-10-CM

## 2013-12-19 DIAGNOSIS — Z880 Allergy status to penicillin: Secondary | ICD-10-CM | POA: Insufficient documentation

## 2013-12-19 DIAGNOSIS — Y929 Unspecified place or not applicable: Secondary | ICD-10-CM | POA: Insufficient documentation

## 2013-12-19 DIAGNOSIS — R531 Weakness: Secondary | ICD-10-CM | POA: Insufficient documentation

## 2013-12-19 DIAGNOSIS — R29898 Other symptoms and signs involving the musculoskeletal system: Secondary | ICD-10-CM

## 2013-12-19 DIAGNOSIS — M5136 Other intervertebral disc degeneration, lumbar region: Secondary | ICD-10-CM

## 2013-12-19 DIAGNOSIS — X58XXXA Exposure to other specified factors, initial encounter: Secondary | ICD-10-CM | POA: Insufficient documentation

## 2013-12-19 DIAGNOSIS — F419 Anxiety disorder, unspecified: Secondary | ICD-10-CM | POA: Insufficient documentation

## 2013-12-19 MED ORDER — LORAZEPAM 1 MG PO TABS
1.0000 mg | ORAL_TABLET | Freq: Once | ORAL | Status: AC
  Administered 2013-12-19: 1 mg via ORAL
  Filled 2013-12-19: qty 1

## 2013-12-19 NOTE — Discharge Instructions (Signed)
Call for a follow up appointment with a Family or Primary Care Provider.  Call a back specialist for further evaluation of your chronic back pain and lower extremity weakness. Return if Symptoms worsen.   Take medication as prescribed.  Ice your shoulder 3-4 times a day.

## 2013-12-19 NOTE — ED Provider Notes (Signed)
CSN: [id number]     Arrival date & time 12/19/13  1116 History   First MD Initiated Contact with Patient 12/19/13 1332     Chief Complaint  Patient presents with  . Numbness     (Consider location/radiation/quality/duration/timing/severity/associated sxs/prior Treatment) HPI Comments: The patient is a 48 year old male with known degenerative disc disease, with her encroachment presenting to emergency room chief complaint of increasing right lower extremity paresthesias. Patient reports right leg "not working" causing him to fall today. Patient reports similar symptoms earlier this week and was seen at [facility] long for fall, negative CT of head 12/19/2013. Patient reports bladder incontinence as "dribbling" unchanged for the past 10 years ago. Denies bowel incontinence, he reports he is unable to feel his penis while having a bowel movement but reports abdominal discomfort/cramping with bowel movement. Denies abdominal pain fever, chills. Denies new medication, reports compliance with medication. Patient has not followed up with a back specialist. Reports taking tramadol without full resolution of symptoms. Patient also reports right shoulder discomfort from falling worsened with movement. He reports similar symptoms in the past with previous falls.  The history is provided by the patient. No language interpreter was used.    Past Medical History  Diagnosis Date  . Hypertension   . Anxiety   . Tachycardia - pulse   . Degenerative disk disease   . Spinal stenosis   . Degenerative disk disease   . Tobacco abuse   . Obesity   . Polysubstance abuse   . Chest pain     Hospital, March, 2014, negative enzymes, patient refused in-hospital  stress test, patient canceled outpatient stress test  . Spinal stenosis    History reviewed. No pertinent past surgical history. Family History  Problem Relation Age of Onset  . Stroke Mother   . Hypertension Mother   . Stroke Father   . Hypertension  Father   . Dementia Father   . Diabetes Father   . Heart disease Father   . Cancer Sister     brain  . Heart disease Brother    History  Substance Use Topics  . Smoking status: Current Every Day Smoker -- 0.50 packs/day    Types: Cigarettes  . Smokeless tobacco: Never Used  . Alcohol Use: No    Review of Systems  Constitutional: Negative for fever and chills.  Cardiovascular: Negative for leg swelling.  Gastrointestinal: Negative for abdominal pain, diarrhea, constipation and blood in stool.  Musculoskeletal: Positive for back pain and gait problem. Negative for joint swelling.  Skin: Negative for color change and wound.  Neurological: Positive for weakness and numbness.      Allergies  Blueberry flavor; Other; Penicillins; Robaxin; Flexeril; and Toradol  Home Medications   Prior to Admission medications   Medication Sig Start Date End Date Taking? Authorizing Provider  cloNIDine (CATAPRES) 0.3 MG tablet Take 1 tablet (0.3 mg total) by mouth 2 (two) times daily. For HTN 11/16/13  Yes Olugbemiga E Hyman Hopes, MD  diazepam (VALIUM) 10 MG tablet Take 10 mg by mouth every 6 (six) hours as needed (for muscle pain).   Yes Historical Provider, MD  diphenhydrAMINE (SOMINEX) 25 MG tablet Take 25 mg by mouth at bedtime as needed for allergies or sleep (allergic reaction).   Yes Historical Provider, MD  lisinopril (PRINIVIL,ZESTRIL) 20 MG tablet Take 1 tablet (20 mg total) by mouth every morning. For high blood pressure 10/12/13  Yes Olugbemiga E Hyman Hopes, MD  metoprolol (LOPRESSOR) 50 MG tablet Take 1 tablet (  50 mg total) by mouth 2 (two) times daily. For high blood pressure 09/30/13  Yes Sanjuana KavaAgnes I Nwoko, NP  ranitidine (ZANTAC) 75 MG tablet Take 75 mg by mouth daily as needed for heartburn (heartburn).   Yes Historical Provider, MD  sodium chloride (OCEAN) 0.65 % SOLN nasal spray Place 2 sprays into both nostrils 2 (two) times daily.   Yes Historical Provider, MD  traMADol (ULTRAM) 50 MG tablet  Take 2 tablets (100 mg total) by mouth every 12 (twelve) hours as needed for severe pain (pain). 12/16/13  Yes Leslie K Sofia, PA-C   BP 131/73  Pulse 51  Temp(Src) 97.5 F (36.4 C) (Oral)  Resp 16  SpO2 95% Physical Exam  Nursing note and vitals reviewed. Constitutional: He is oriented to person, place, and time. He appears well-developed and well-nourished.  Non-toxic appearance. He does not have a sickly appearance. He does not appear ill. No distress.  HENT:  Head: Normocephalic and atraumatic.  Eyes: EOM are normal. Pupils are equal, round, and reactive to light.  Neck: Neck supple.  Cardiovascular: Normal rate and regular rhythm.   Pulmonary/Chest: Effort normal and breath sounds normal. No respiratory distress. He has no wheezes.  Abdominal: Soft. There is no tenderness. There is no rebound.  Genitourinary: Rectal exam shows anal tone abnormal.  Musculoskeletal:       Right shoulder: He exhibits decreased range of motion. He exhibits no tenderness, no swelling, no effusion, no crepitus, no deformity, no laceration and normal strength.       Arms: Neurological: He is alert and oriented to person, place, and time. A sensory deficit is present. GCS eye subscore is 4. GCS verbal subscore is 5. GCS motor subscore is 6.  Reflex Scores:      Patellar reflexes are 0 on the right side and 0 on the left side.      Achilles reflexes are 0 on the right side and 0 on the left side. Unable to check patellar reflexes due to patient unable to relax lower extremities. Decreased sensation to light and sharp touch bilaterally, right greater than left. Normal strength bilaterally.  Moves bilateral lower extremities equally.  Skin: Skin is warm and dry. He is not diaphoretic.  Psychiatric: He has a normal mood and affect. His behavior is normal.    ED Course  Procedures (including critical care time) Labs Review Labs Reviewed - No data to display  Imaging Review Dg Shoulder Right  12/19/2013    CLINICAL DATA:  Fall 2 days ago and again today with superior and anterior right shoulder pain and swelling.  EXAM: RIGHT SHOULDER - 2+ VIEW  COMPARISON:  10/29/2012  FINDINGS: Minimal degenerative change of the [facility]. No evidence of fracture or dislocation.  IMPRESSION: No acute findings.   Electronically Signed   By: Elberta Fortisaniel  Boyle M.D.   On: 12/19/2013 15:02   Mr Lumbar Spine Wo Contrast  12/19/2013   CLINICAL DATA:  Initial in counter. Fell on 12/16/2013 with subsequent back pain. Leg numbness.  EXAM: MRI LUMBAR SPINE WITHOUT CONTRAST  TECHNIQUE: Multiplanar, multisequence MR imaging of the lumbar spine was performed. No intravenous contrast was administered.  COMPARISON:  MRI 09/11/2012  FINDINGS: There is no abnormality at T12-L1 or L1-2.  L2-3: Shallow central disc herniation indents the thecal sac but does not appear to cause neural compression.  L3-4: Circumferential bulging of the disc. Mild facet and ligamentous hypertrophy. Mild narrowing of both lateral recesses but no definite neural compression.  L4-5:  Shallow protrusion of disc material. Mild facet and ligamentous hypertrophy. Mild stenosis of both lateral recesses but no definite neural compression.  L5-S1: Broad-based disc herniation more prominent in the right posterior lateral direction. Facet and ligamentous hypertrophy. Narrowing of the subarticular lateral recesses that could affect the S1 nerve roots. Foraminal narrowing on the right that could affect the exiting L5 nerve root.  Compared to the previous study, no changes demonstrated.  IMPRESSION: No change since the study of 09/11/2012.  At L4-5 and above, there disc bulges with mild narrowing of the lateral recesses but no definite neural compression.  At L5-S1, there is shallow broad-based herniation of disc material more prominent in the right posterior lateral direction. There is narrowing of both subarticular lateral recesses and of the foramen on the right that could possibly  cause neural compression. As noted above, however, this is unchanged since July 2014.   Electronically Signed   By: Paulina FusiMark  Shogry M.D.   On: 12/19/2013 16:14     EKG Interpretation None      MDM   Final diagnoses:  Lower extremity weakness  Degenerative disc disease, lumbar  Foraminal stenosis of lumbosacral region  Contusion of right shoulder, initial encounter   Patient presents with reported progressive weakness of right lower extremity and right shoulder pain due to a fall. Urology exam with subjective decrease in sensation to light and sharp touch, abnormal rectal tone no bowel incontinence in ED. Also unable to evaluate patellar reflexes due to patient unable to relax his legs. EMR shows MRI 09/11/2012 MRI: 1. Compared with the prior study from 2008, a broad-based disc  protrusion at L5-S1 is stable, contributing to chronic right  greater than left foraminal stenosis. 2. A disc protrusion at L4-L5 has slightly greater rightward extension and narrows the lateral recesses, right greater than left. There is possible L5 nerve root encroachment. 3. Increased central and left greater than right lateral recess stenosis at L3-L4 secondary to a broad-based central disc protrusion. 4. Shallow left paracentral disc protrusion at L2-L3 without resulting nerve root encroachment. MRI performed today without changes from 09/20/2012, negative shoulder x-ray. Discussed following up with a back specialist for further evaluation of his chronic back pain and known stenosis. Re-evaluation Pt resting comfortably in room. Discussed MRI and x-ray results with the patient. Patient requesting narcotic pain medication or refill on tramadol. Patient has history of substance abuse and discussed that provide will not prescribe narcotic or tramadol for chronic issues and to followup with PCP tomorrow. Discussed treatment plan with the patient. Return precautions given. Reports understanding and no other concerns at this  time.  Patient is stable for discharge at this time.  Patient left emergency department prior to discharge paperwork.   Mellody DrownLauren Samwise Eckardt, PA-C 12/19/13 1700

## 2013-12-19 NOTE — ED Notes (Signed)
Patient returned from MRI.

## 2013-12-19 NOTE — ED Notes (Signed)
Remains in Radiology still.

## 2013-12-19 NOTE — ED Notes (Signed)
Pt reports falling on 10/15 and had back pain and shoulder pain. Reports having leg numbness and pt went to [facility] on 10/15 and had negative ct scan of head but did not get MRI of his back. Pt reports increase in numbness that is progressively going up his legs to his waist and now more severe on right side. Hx of spinal stenosis.

## 2013-12-19 NOTE — ED Notes (Signed)
Went in room to discharge patient and he had left. PA had gone over discharge instructions; he did not wait for discharge papers.

## 2013-12-19 NOTE — ED Notes (Signed)
Patient transported to X-ray 

## 2013-12-19 NOTE — ED Notes (Signed)
Called from Radiology; patient nervous about MRI; requesting anxiety medication. Jimmey RalphParker, PA notified. Gave Ativan 1mg  PO in radiology at 1445.

## 2013-12-19 NOTE — ED Provider Notes (Signed)
Medical screening examination/treatment/procedure(s) were performed by non-physician practitioner and as supervising physician I was immediately available for consultation/collaboration.   EKG Interpretation   Date/Time:  Thursday December 16 2013 15:35:38 EDT Ventricular Rate:  126 PR Interval:  149 QRS Duration: 86 QT Interval:  316 QTC Calculation: 457 R Axis:   -11 Text Interpretation:  Sinus tachycardia Probable left atrial enlargement  Borderline T abnormalities, diffuse leads Baseline wander in lead(s) V1  Confirmed by Rubin PayorPICKERING  MD, Trisha Ken [phone] on 12/16/2013 6:11:33 PM       Juliet RudeNathan R. Rubin PayorPickering, MD 12/19/13 [phone]

## 2013-12-20 NOTE — ED Provider Notes (Signed)
Medical screening examination/treatment/procedure(s) were performed by non-physician practitioner and as supervising physician I was immediately available for consultation/collaboration.   EKG Interpretation None       Raeford RazorStephen Chelcey Caputo, MD 12/20/13 1517

## 2013-12-21 ENCOUNTER — Emergency Department (HOSPITAL_COMMUNITY)
Admission: EM | Admit: 2013-12-21 | Discharge: 2013-12-21 | Disposition: A | Discharge status: Home / Self Care | Payer: No Typology Code available for payment source | Attending: Emergency Medicine | Admitting: Emergency Medicine

## 2013-12-21 ENCOUNTER — Telehealth: Payer: Self-pay | Admitting: Internal Medicine

## 2013-12-21 ENCOUNTER — Other Ambulatory Visit: Payer: Self-pay | Admitting: *Deleted

## 2013-12-21 DIAGNOSIS — Z880 Allergy status to penicillin: Secondary | ICD-10-CM | POA: Insufficient documentation

## 2013-12-21 DIAGNOSIS — E669 Obesity, unspecified: Secondary | ICD-10-CM | POA: Insufficient documentation

## 2013-12-21 DIAGNOSIS — Y9389 Activity, other specified: Secondary | ICD-10-CM | POA: Insufficient documentation

## 2013-12-21 DIAGNOSIS — S4991XA Unspecified injury of right shoulder and upper arm, initial encounter: Secondary | ICD-10-CM | POA: Insufficient documentation

## 2013-12-21 DIAGNOSIS — R296 Repeated falls: Secondary | ICD-10-CM

## 2013-12-21 DIAGNOSIS — G8929 Other chronic pain: Secondary | ICD-10-CM

## 2013-12-21 DIAGNOSIS — M549 Dorsalgia, unspecified: Secondary | ICD-10-CM

## 2013-12-21 DIAGNOSIS — Y92012 Bathroom of single-family (private) house as the place of occurrence of the external cause: Secondary | ICD-10-CM | POA: Insufficient documentation

## 2013-12-21 DIAGNOSIS — M25511 Pain in right shoulder: Secondary | ICD-10-CM

## 2013-12-21 DIAGNOSIS — I10 Essential (primary) hypertension: Secondary | ICD-10-CM | POA: Insufficient documentation

## 2013-12-21 DIAGNOSIS — Z8739 Personal history of other diseases of the musculoskeletal system and connective tissue: Secondary | ICD-10-CM | POA: Insufficient documentation

## 2013-12-21 DIAGNOSIS — F419 Anxiety disorder, unspecified: Secondary | ICD-10-CM | POA: Insufficient documentation

## 2013-12-21 DIAGNOSIS — Z79899 Other long term (current) drug therapy: Secondary | ICD-10-CM | POA: Insufficient documentation

## 2013-12-21 DIAGNOSIS — Z720 Tobacco use: Secondary | ICD-10-CM | POA: Insufficient documentation

## 2013-12-21 DIAGNOSIS — S3992XA Unspecified injury of lower back, initial encounter: Secondary | ICD-10-CM | POA: Insufficient documentation

## 2013-12-21 DIAGNOSIS — W01198A Fall on same level from slipping, tripping and stumbling with subsequent striking against other object, initial encounter: Secondary | ICD-10-CM | POA: Insufficient documentation

## 2013-12-21 MED ORDER — OXYCODONE-ACETAMINOPHEN 5-325 MG PO TABS
2.0000 | ORAL_TABLET | Freq: Once | ORAL | Status: AC
  Administered 2013-12-21: 2 via ORAL
  Filled 2013-12-21: qty 2

## 2013-12-21 MED ORDER — ACETAMINOPHEN-CODEINE #3 300-30 MG PO TABS: 1.0000 | ORAL_TABLET | Freq: Three times a day (TID) | ORAL | Status: DC | PRN

## 2013-12-21 NOTE — ED Notes (Signed)
Patient walking around without any complaints.

## 2013-12-21 NOTE — Progress Notes (Signed)
  CARE MANAGEMENT ED NOTE 12/21/2013  Patient:  Travis LimboALTON,Akif   Account Number:  [id number]  Date Initiated:  12/21/2013  Documentation initiated by:  Ferdinand CavaSCHETTINO,Eldin Bonsell  Subjective/Objective Assessment:   48 yo male presenting to the ED after fall     Subjective/Objective Assessment Detail:     Action/Plan:   [facility]   Action/Plan Detail:   Anticipated DC Date:       Status Recommendation to Physician:   Result of Recommendation:  Agreed    DC Planning Services  Other  CM consult    Choice offered to / List presented to:            Status of service:  Completed, signed off  ED Comments:   ED Comments Detail:  CM spoke with patient regarding ED visit. 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 He stated that he has spinal stenosis and DJD which causes him to fall and have other issues, such as incontinence. He did state that he has and has had the orange card but there is not a neurosurgeon that accepts the orange card therefore he has not been able to treat the problem. He [place] card until the first of the month. He stated that once he gets his card he already has an appointment with a neurosurgeon, referred to by his PCP. The patient [facility]. The patient verbalized frustration at not being able to get a prescription for pain medications in the ED, but he stated he is unable to establish care with a pain clinic until he gets his medicaid card. This CM encouraged the [facility] after leaving the ED fur further assistance by his PCP if appropriate. Encouraged patient to work closely with his PCP for treatment, follow up, and referrals especially after he receives his Medicaid card as all referral will need to be made  by his PCP. The patient verbalized understanding and appreciation for assist and information and had no other questions or concerns.

## 2013-12-21 NOTE — Progress Notes (Signed)
Pt came into the office day requesting medication for pain. Pt stated that the tramadol wasn't working and Dr. Hyman HopesJegede authorized to switch his pain medication to tylenol #3

## 2013-12-21 NOTE — Discharge Instructions (Signed)
Please follow up with your neurosurgeon for further management of your low back pain, and recurrent fall.  Return if you have any concerns.  Follow up with your primary care provider for further pain management.   Back Injury Prevention The following tips can help you to prevent a back injury. PHYSICAL FITNESS  Exercise often. Try to develop strong stomach (abdominal) muscles.  Do aerobic exercises often. This includes walking, jogging, biking, swimming.  Do exercises that help with balance and strength often. This includes tai chi and yoga.  Stretch before and after you exercise.  Keep a healthy weight. DIET   Ask your doctor how much calcium and vitamin D you need every day.  Include calcium in your diet. Foods high in calcium include dairy products; green, leafy vegetables; and products with calcium added (fortified).  Include vitamin D in your diet. Foods high in vitamin D include milk and products with vitamin D added.  Think about taking a multivitamin or other nutritional products called " supplements."  Stop smoking if you smoke. POSTURE   Sit and stand up straight. Avoid leaning forward or hunching over.  Choose chairs that support your lower back.  If you work at a desk:  Sit close to your work so you do not lean over.  Keep your chin tucked in.  Keep your neck drawn back.  Keep your elbows bent at a right angle. Your arms should look like the letter "L."  Sit high and close to the steering wheel when you drive. Add low back support to your car seat if needed.  Avoid sitting or standing in one position for too long. Get up and move around every hour. Take breaks if you are driving for a long time.  Sleep on your side with your knees slightly bent. You can also sleep on your back with a pillow under your knees. Do not sleep on your stomach. LIFTING, TWISTING, AND REACHING  Avoid heavy lifting, especially lifting over and over again. If you must do heavy  lifting:  Stretch before lifting.  Work slowly.  Rest between lifts.  Use carts and dollies to move objects when possible.  Make several small trips instead of carrying 1 heavy load.  Ask for help when you need it.  Ask for help when moving big, awkward objects.  Follow these steps when lifting:  Stand with your feet shoulder-width apart.  Get as close to the object as you can. Do not pick up heavy objects that are far from your body.  Use handles or lifting straps when possible.  Bend at your knees. Squat down, but keep your heels off the floor.  Keep your shoulders back, your chin tucked in, and your back straight.  Lift the object slowly. Tighten the muscles in your legs, stomach, and butt. Keep the object as close to the center of your body as possible.  Reverse these directions when you put a load down.  Do not:  Lift the object above your waist.  Twist at the waist while lifting or carrying a load. Move your feet if you need to turn, not your waist.  Bend over without bending at your knees.  Avoid reaching over your head, across a table, or for an object on a high surface. OTHER TIPS  Avoid wet floors and keep sidewalks clear of ice.  Do not sleep on a mattress that is too soft or too hard.  Keep items that you use often within easy reach.  Put heavier objects on shelves at waist level. Put lighter objects on lower or higher shelves.  Find ways to lessen your stress. You can try exercise, massage, or relaxation.  Get help for depression or anxiety if needed. GET HELP IF:  You injure your back.  You have questions about diet, exercise, or other ways to prevent back injuries. MAKE SURE YOU:  Understand these instructions.  Will watch your condition.  Will get help right away if you are not doing well or get worse. Document Released: 08/07/2007 Document Revised: 05/13/2011 Document Reviewed: 04/01/2011 [facility] Patient Information 2015 [place],  [place]. This information is not intended to replace advice given to you by your health care provider. Make sure you discuss any questions you have with your health care provider.

## 2013-12-21 NOTE — ED Notes (Signed)
Per [facility], pt fell 1 week ago, has fallen 3x in the last week. Fell last night, the very first fall caused incontinence. Was seen here on Sunday and had an MRI. Pt is not sure if he blacked out last night before falling. Has chronic back pain and spinal stenosis. Having pain in the lumbar region.

## 2013-12-21 NOTE — ED Notes (Signed)
Sue LushAndrea, Case Management at the bedside with the patient.

## 2013-12-21 NOTE — ED Provider Notes (Signed)
CSN: [id number]     Arrival date & time 12/21/13  0912 History   First MD Initiated Contact with Patient 12/21/13 0914     Chief Complaint  Patient presents with  . Fall     (Consider location/radiation/quality/duration/timing/severity/associated sxs/prior Treatment) HPI  48 year old niece male with history of polysubstance abuse, spinal stenosis, anxiety, degenerative disc disease who is brought here via EMS for evaluation of a fall. Patient admits to having chronic back pain and spinal stenosis and states he has fallen 3 separate times for the past week.  Report having urinary incontinence, progressive weakness of RLE.  Pt was seen 2 days ago for the last fall.  An MRI of his lower back obtained demonstrating no significant changes compare to study from 09/20/2012.  Pt left AMA when he did not received narcotic pain meds as requested due to hx of polysubstance abuse.  Patient states since the first fall last week he has been having trouble with leg weakness, and bowel incontinence. This morning he wanted to use the bathroom, got up, but did not feel any sensation to his right leg and subsequently fell backward striking her back against report worsening low back pain and right shoulder pain from the fall. Pain is 10 out of 10. Patient states he [facility] and spoke with the nurse who recommend patient to come to the ER for further evaluation. At this time patient denies any headache, neck pain, chest pain, abdominal pain, or any other injury. Patient state he ran out of his tramadol medication.  He is scheduled to be seen by a neurosurgeon in 2 weeks and request pain medication until then.        Past Medical History  Diagnosis Date  . Hypertension   . Anxiety   . Tachycardia - pulse   . Degenerative disk disease   . Spinal stenosis   . Degenerative disk disease   . Tobacco abuse   . Obesity   . Polysubstance abuse   . Chest pain     Hospital, March,  2014, negative enzymes, patient refused in-hospital  stress test, patient canceled outpatient stress test  . Spinal stenosis    No past surgical history on file. Family History  Problem Relation Age of Onset  . Stroke Mother   . Hypertension Mother   . Stroke Father   . Hypertension Father   . Dementia Father   . Diabetes Father   . Heart disease Father   . Cancer Sister     brain  . Heart disease Brother    History  Substance Use Topics  . Smoking status: Current Every Day Smoker -- 0.50 packs/day    Types: Cigarettes  . Smokeless tobacco: Never Used  . Alcohol Use: No    Review of Systems  Constitutional: Negative for fever.  Musculoskeletal: Positive for back pain.  Skin: Negative for rash and wound.  Neurological: Positive for numbness.      Allergies  Blueberry flavor; Other; Penicillins; Robaxin; Flexeril; and Toradol  Home Medications   Prior to Admission medications   Medication Sig Start Date End Date Taking? Authorizing Provider  cloNIDine (CATAPRES) 0.3 MG tablet Take 1 tablet (0.3 mg total) by mouth 2 (two) times daily. For HTN 11/16/13   Olugbemiga E Hyman HopesJegede, MD  diazepam (VALIUM) 10 MG tablet Take 10 mg by mouth every 6 (six) hours as needed (for muscle pain).    Historical Provider, MD  diphenhydrAMINE (SOMINEX) 25 MG tablet  Take 25 mg by mouth at bedtime as needed for allergies or sleep (allergic reaction).    Historical Provider, MD  lisinopril (PRINIVIL,ZESTRIL) 20 MG tablet Take 1 tablet (20 mg total) by mouth every morning. For high blood pressure 10/12/13   Quentin Angstlugbemiga E Jegede, MD  metoprolol (LOPRESSOR) 50 MG tablet Take 1 tablet (50 mg total) by mouth 2 (two) times daily. For high blood pressure 09/30/13   Sanjuana KavaAgnes I Nwoko, NP  ranitidine (ZANTAC) 75 MG tablet Take 75 mg by mouth daily as needed for heartburn (heartburn).    Historical Provider, MD  sodium chloride (OCEAN) 0.65 % SOLN nasal spray Place 2 sprays into both nostrils 2 (two) times daily.     Historical Provider, MD  traMADol (ULTRAM) 50 MG tablet Take 2 tablets (100 mg total) by mouth every 12 (twelve) hours as needed for severe pain (pain). 12/16/13   Elson AreasLeslie K Sofia, PA-C   SpO2 96% Physical Exam  Constitutional: He appears well-developed and well-nourished. No distress.  Moderately obese Caucasian male appears to be in no acute distress   HENT:  Head: Normocephalic and atraumatic.  Eyes: Conjunctivae are normal.  Neck: Normal range of motion. Neck supple.  No cervical spine tenderness.  Cardiovascular: Normal rate, regular rhythm and intact distal pulses.   Pulmonary/Chest: Effort normal and breath sounds normal. He exhibits no tenderness.  Abdominal: Soft. There is no tenderness.  Musculoskeletal: He exhibits tenderness (Right shoulder: Mild diffuse tenderness to palpation of shoulder with full range of motion and no evidence of deformity or overlying skin changes. Tenderness lumbar and paralumbar spine with normal flexion extension and rotation.).  Neurological: He is alert.  Difficult to assess bilateral patellar deep tendon reflex. Bilateral feet with normal dorsi and plantar flexion  Skin: No rash noted.  Psychiatric: He has a normal mood and affect.    ED Course  Procedures (including critical care time)  9:46 AM Patient with chronic pain, presents for recurrent falls. He is complaining of right shoulder pain and low back pain. On exam, no significant finding to suggest acute fracture or dislocation. Patient was seen for essentially the same complaint 2 days ago after a fall. He did had a normal right shoulder x-ray, and a and L. spine MRI shows no significant changes as compared to prior. He has history of polysubstance abuse and is on chronic pain medication. At this time I have low suspicion for any acute fracture or dislocation, patient agrees. We'll provide pain medication and will attempt to ambulate patient. It will be for the patient's best interest to receive  his chronic narcotic pain medication from his primary care Dr. which I discussed this with him.  10:46 AM Pain improves, pt able to ambulate with assist.  Recommend close f/u with PCP and with neurosurgeon for further management.    Labs Review Labs Reviewed - No data to display  Imaging Review Dg Shoulder Right  12/19/2013   CLINICAL DATA:  Fall 2 days ago and again today with superior and anterior right shoulder pain and swelling.  EXAM: RIGHT SHOULDER - 2+ VIEW  COMPARISON:  10/29/2012  FINDINGS: Minimal degenerative change of the [facility]. No evidence of fracture or dislocation.  IMPRESSION: No acute findings.   Electronically Signed   By: Elberta Fortisaniel  Boyle M.D.   On: 12/19/2013 15:02   Mr Lumbar Spine Wo Contrast  12/19/2013   CLINICAL DATA:  Initial in counter. Fell on 12/16/2013 with subsequent back pain. Leg numbness.  EXAM: MRI LUMBAR  SPINE WITHOUT CONTRAST  TECHNIQUE: Multiplanar, multisequence MR imaging of the lumbar spine was performed. No intravenous contrast was administered.  COMPARISON:  MRI 09/11/2012  FINDINGS: There is no abnormality at T12-L1 or L1-2.  L2-3: Shallow central disc herniation indents the thecal sac but does not appear to cause neural compression.  L3-4: Circumferential bulging of the disc. Mild facet and ligamentous hypertrophy. Mild narrowing of both lateral recesses but no definite neural compression.  L4-5: Shallow protrusion of disc material. Mild facet and ligamentous hypertrophy. Mild stenosis of both lateral recesses but no definite neural compression.  L5-S1: Broad-based disc herniation more prominent in the right posterior lateral direction. Facet and ligamentous hypertrophy. Narrowing of the subarticular lateral recesses that could affect the S1 nerve roots. Foraminal narrowing on the right that could affect the exiting L5 nerve root.  Compared to the previous study, no changes demonstrated.  IMPRESSION: No change since the study of 09/11/2012.  At L4-5 and  above, there disc bulges with mild narrowing of the lateral recesses but no definite neural compression.  At L5-S1, there is shallow broad-based herniation of disc material more prominent in the right posterior lateral direction. There is narrowing of both subarticular lateral recesses and of the foramen on the right that could possibly cause neural compression. As noted above, however, this is unchanged since July 2014.   Electronically Signed   By: Paulina Fusi M.D.   On: 12/19/2013 16:14     EKG Interpretation None      MDM   Final diagnoses:  Recurrent falls  Chronic back pain  Right shoulder pain    BP 111/78  Pulse 55  Temp(Src) 98.5 F (36.9 C) (Oral)  Resp 16  SpO2 98%     Fayrene Helper, PA-C 12/21/13 1046

## 2013-12-21 NOTE — ED Provider Notes (Signed)
Medical screening examination/treatment/procedure(s) were performed by non-physician practitioner and as supervising physician I was immediately available for consultation/collaboration.   EKG Interpretation None        Vanetta MuldersScott Elleanna Melling, MD 12/21/13 1050

## 2013-12-21 NOTE — Telephone Encounter (Signed)
Pt calling from the ED and would like to speak to nurse. Call was transferred to nurse.

## 2013-12-22 NOTE — Telephone Encounter (Signed)
Left message to return call 

## 2013-12-24 ENCOUNTER — Other Ambulatory Visit: Payer: Self-pay

## 2013-12-24 ENCOUNTER — Telehealth: Payer: Self-pay

## 2013-12-24 DIAGNOSIS — R52 Pain, unspecified: Secondary | ICD-10-CM

## 2013-12-24 NOTE — Telephone Encounter (Signed)
Returned patient phone call Patient not available Left message on voice mail to return our call 

## 2013-12-24 NOTE — Telephone Encounter (Signed)
Patient called requesting referral to pain management and  GI Doctor Per patient he has letter from [employer] for 100% discount

## 2013-12-27 ENCOUNTER — Other Ambulatory Visit: Payer: Self-pay | Admitting: Internal Medicine

## 2013-12-27 ENCOUNTER — Telehealth: Payer: Self-pay | Admitting: Internal Medicine

## 2013-12-27 NOTE — Telephone Encounter (Signed)
Left message on VM for pt to call nurse line or go directly to Er if symptoms worsen with chest pain,increased numbness/tingling

## 2013-12-27 NOTE — Telephone Encounter (Signed)
Patient calling to speak to nurse in regards to current symptoms that he's experiencing. (pain in both arms, swelling in both legs. HTN). After listening to patient's concerns, I scheduled the earliest available appt for pt. (Nov. 2 at 5:00p) Patient would still like to speak to a nurse to discuss his symptoms and see if he should go to ED or Urgent care until he's able to be seen next week. Pls assist.

## 2013-12-28 ENCOUNTER — Emergency Department (INDEPENDENT_AMBULATORY_CARE_PROVIDER_SITE_OTHER)
Admission: EM | Admit: 2013-12-28 | Discharge: 2013-12-28 | Disposition: A | Discharge status: Home / Self Care | Payer: No Typology Code available for payment source | Source: Home / Self Care | Attending: Family Medicine | Admitting: Family Medicine

## 2013-12-28 ENCOUNTER — Encounter (HOSPITAL_COMMUNITY): Payer: Self-pay | Admitting: Emergency Medicine

## 2013-12-28 ENCOUNTER — Telehealth: Payer: Self-pay | Admitting: Internal Medicine

## 2013-12-28 DIAGNOSIS — R202 Paresthesia of skin: Secondary | ICD-10-CM

## 2013-12-28 MED ORDER — NORTRIPTYLINE HCL 10 MG PO CAPS: 10.0000 mg | ORAL_CAPSULE | Freq: Every day | ORAL | Status: DC

## 2013-12-28 NOTE — ED Provider Notes (Addendum)
Travis [name] is a 48 y.o. male who presents to Urgent Care today for numb tingling. Patient notes bilateral lower extremity numbness and tingling. This is been going on for weeks. Patient's been seen by the emergency room twice for this problem and had a lumbar MRI performed on October 18 which did not show any severe compression. He has an appointment coming up soon with a physical medicine rehabilitation doctor. The tingling sensation is quite bothersome. No weakness or numbness or bowel bladder dysfunction. He's been seen by a urologist as well past. He notes that he cannot tolerate gabapentin or amitriptyline. Amitriptyline caused significant sedation. He has never tried nortriptyline.   Past Medical History  Diagnosis Date  . Hypertension   . Anxiety   . Tachycardia - pulse   . Degenerative disk disease   . Spinal stenosis   . Degenerative disk disease   . Tobacco abuse   . Obesity   . Polysubstance abuse   . Chest pain     Hospital, March, 2014, negative enzymes, patient refused in-hospital  stress test, patient canceled outpatient stress test  . Spinal stenosis    History  Substance Use Topics  . Smoking status: Current Every Day Smoker -- 0.50 packs/day    Types: Cigarettes  . Smokeless tobacco: Never Used  . Alcohol Use: No   ROS as above Medications: No current facility-administered medications for this encounter.   Current Outpatient Prescriptions  Medication Sig Dispense Refill  . cloNIDine (CATAPRES) 0.3 MG tablet Take 1 tablet (0.3 mg total) by mouth 2 (two) times daily. For HTN  60 tablet  0  . diazepam (VALIUM) 10 MG tablet Take 10 mg by mouth every 6 (six) hours as needed (for muscle pain).      Marland Kitchen. diphenhydrAMINE (SOMINEX) 25 MG tablet Take 25 mg by mouth at bedtime as needed for allergies or sleep (allergic reaction).      Marland Kitchen. lisinopril (PRINIVIL,ZESTRIL) 20 MG tablet Take 1 tablet (20 mg total) by mouth every morning. For high blood pressure  90 tablet  3  .  metoprolol (LOPRESSOR) 50 MG tablet Take 1 tablet (50 mg total) by mouth 2 (two) times daily. For high blood pressure      . ranitidine (ZANTAC) 75 MG tablet Take 75 mg by mouth daily as needed for heartburn (heartburn).      . sodium chloride (OCEAN) 0.65 % SOLN nasal spray Place 2 sprays into both nostrils 2 (two) times daily.      . traMADol (ULTRAM) 50 MG tablet Take 100 mg by mouth every 6 (six) hours as needed for moderate pain.      Marland Kitchen. acetaminophen-codeine (TYLENOL #3) 300-30 MG per tablet Take 1 tablet by mouth every 8 (eight) hours as needed for moderate pain.  30 tablet  0  . nortriptyline (PAMELOR) 10 MG capsule Take 1 capsule (10 mg total) by mouth at bedtime.  30 capsule  0    Exam:  Pulse 71  Temp(Src) 98.3 F (36.8 C) (Oral)  Resp 12  SpO2 97% Gen: Well NAD Back: Nontender to spinal midline. Decreased range of motion of the low back. Lower extremity reflexes are diminished but equal bilaterally. Lower extremity strength is intact throughout. Patient has intact sensation to light touch bilateral lower extremities. The feet are normal-appearing without any lesions. Pulses are intact. Sensation is intact. Capillary refill is intact bilaterally.   EXAM:  MRI LUMBAR SPINE WITHOUT CONTRAST  TECHNIQUE:  Multiplanar, multisequence MR imaging of the lumbar  spine was  performed. No intravenous contrast was administered.  COMPARISON: MRI 09/11/2012  FINDINGS:  There is no abnormality at T12-L1 or L1-2.  L2-3: Shallow central disc herniation indents the thecal sac but  does not appear to cause neural compression.  L3-4: Circumferential bulging of the disc. Mild facet and  ligamentous hypertrophy. Mild narrowing of both lateral recesses but  no definite neural compression.  L4-5: Shallow protrusion of disc material. Mild facet and  ligamentous hypertrophy. Mild stenosis of both lateral recesses but  no definite neural compression.  L5-S1: Broad-based disc herniation more  prominent in the right  posterior lateral direction. Facet and ligamentous hypertrophy.  Narrowing of the subarticular lateral recesses that could affect the  S1 nerve roots. Foraminal narrowing on the right that could affect  the exiting L5 nerve root.  Compared to the previous study, no changes demonstrated.  IMPRESSION:  No change since the study of 09/11/2012.  At L4-5 and above, there disc bulges with mild narrowing of the  lateral recesses but no definite neural compression.  At L5-S1, there is shallow broad-based herniation of disc material  more prominent in the right posterior lateral direction. There is  narrowing of both subarticular lateral recesses and of the foramen  on the right that could possibly cause neural compression. As noted  above, however, this is unchanged since July 2014.  Electronically Signed  By: Paulina FusiMark Shogry M.D.  On: 12/19/2013 16:14   Assessment and Plan: 48 y.o. male with paresthesias. Possibly related to underlying spinal stenosis or peripheral neuropathy. Ultimately he will have to follow up with the physical medicine and rehabilitation doctor for this issue. However in the meantime we'll attempt low-dose nortriptyline. He has an allergy to Flexeril which may have some cross-reactivity with nortriptyline. We discussed this.  Discussed warning signs or symptoms. Please see discharge instructions. Patient expresses understanding.     Rodolph BongEvan S Monetta Lick, MD 12/28/13 1500  Rodolph BongEvan S Kennethia Lynes, MD 12/28/13 1500

## 2013-12-28 NOTE — Telephone Encounter (Signed)
Patient's

## 2013-12-28 NOTE — Telephone Encounter (Signed)
Patient came complaining about acute pain and he cannot wait until his appointment this Monday  11/2. Patient wants to get refill for Tramadol 50 mg. Please follow up with Patient.

## 2013-12-28 NOTE — ED Notes (Signed)
Reports numbness in both feet that radiates up legs.  States feels like sharp prickly sensations.  Also c/o mild ache in elbow joints.   No otc treatments tried.

## 2013-12-28 NOTE — Discharge Instructions (Signed)
Thank you for coming in today. Take nortriptyline at bedtime for numbness and tingling. If you get hives stopped taking this medication Go to your primary care provider Come back or go to the emergency room if you notice new weakness new numbness problems walking or bowel or bladder problems.   Peripheral Neuropathy Peripheral neuropathy is a type of nerve damage. It affects nerves that carry signals between the spinal cord and other parts of the body. These are called peripheral nerves. With peripheral neuropathy, one nerve or a group of nerves may be damaged.  CAUSES  Many things can damage peripheral nerves. For some people with peripheral neuropathy, the cause is unknown. Some causes include:  Diabetes. This is the most common cause of peripheral neuropathy.  Injury to a nerve.  Pressure or stress on a nerve that lasts a long time.  Too little vitamin B. Alcoholism can lead to this.  Infections.  Autoimmune diseases, such as multiple sclerosis and systemic lupus erythematosus.  Inherited nerve diseases.  Some medicines, such as cancer drugs.  Toxic substances, such as lead and mercury.  Too little blood flowing to the legs.  Kidney disease.  Thyroid disease. SIGNS AND SYMPTOMS  Different people have different symptoms. The symptoms you have will depend on which of your nerves is damaged. Common symptoms include:  Loss of feeling (numbness) in the feet and hands.  Tingling in the feet and hands.  Pain that burns.  Very sensitive skin.  Weakness.  Not being able to move a part of the body (paralysis).  Muscle twitching.  Clumsiness or poor coordination.  Loss of balance.  Not being able to control your bladder.  Feeling dizzy.  Sexual problems. DIAGNOSIS  Peripheral neuropathy is a symptom, not a disease. Finding the cause of peripheral neuropathy can be hard. To figure that out, your health care provider will take a medical history and do a physical  exam. A neurological exam will also be done. This involves checking things affected by your brain, spinal cord, and nerves (nervous system). For example, your health care provider will check your reflexes, how you move, and what you can feel.  Other types of tests may also be ordered, such as:  Blood tests.  A test of the fluid in your spinal cord.  Imaging tests, such as CT scans or an MRI.  Electromyography (EMG). This test checks the nerves that control muscles.  Nerve conduction velocity tests. These tests check how fast messages pass through your nerves.  Nerve biopsy. A small piece of nerve is removed. It is then checked under a microscope. TREATMENT   Medicine is often used to treat peripheral neuropathy. Medicines may include:  Pain-relieving medicines. Prescription or over-the-counter medicine may be suggested.  Antiseizure medicine. This may be used for pain.  Antidepressants. These also may help ease pain from neuropathy.  Lidocaine. This is a numbing medicine. You might wear a patch or be given a shot.  Mexiletine. This medicine is typically used to help control irregular heart rhythms.  Surgery. Surgery may be needed to relieve pressure on a nerve or to destroy a nerve that is causing pain.  Physical therapy to help movement.  Assistive devices to help movement. HOME CARE INSTRUCTIONS   Only take over-the-counter or prescription medicines as directed by your health care provider. Follow the instructions carefully for any given medicines. Do not take any other medicines without first getting approval from your health care provider.  If you have diabetes, work closely  with your health care provider to keep your blood sugar under control.  If you have numbness in your feet:  Check every day for signs of injury or infection. Watch for redness, warmth, and swelling.  Wear padded socks and comfortable shoes. These help protect your feet.  Do not do things that put  pressure on your damaged nerve.  Do not smoke. Smoking keeps blood from getting to damaged nerves.  Avoid or limit alcohol. Too much alcohol can cause a lack of B vitamins. These vitamins are needed for healthy nerves.  Develop a good support system. Coping with peripheral neuropathy can be stressful. Talk to a mental health specialist or join a support group if you are struggling.  Follow up with your health care provider as directed. SEEK MEDICAL CARE IF:   You have new signs or symptoms of peripheral neuropathy.  You are struggling emotionally from dealing with peripheral neuropathy.  You have a fever. SEEK IMMEDIATE MEDICAL CARE IF:   You have an injury or infection that is not healing.  You feel very dizzy or begin vomiting.  You have chest pain.  You have trouble breathing. Document Released: 02/08/2002 Document Revised: 10/31/2010 Document Reviewed: 10/26/2012 [facility] Patient Information 2015 [place]. This information is not intended to replace advice given to you by your health care provider. Make sure you discuss any questions you have with your health care provider.

## 2014-01-03 ENCOUNTER — Ambulatory Visit: Payer: No Typology Code available for payment source | Attending: Internal Medicine | Admitting: Internal Medicine

## 2014-01-03 ENCOUNTER — Encounter: Payer: Self-pay | Admitting: Internal Medicine

## 2014-01-03 VITALS — BP 135/107 | HR 118 | Temp 98.3°F | Resp 14 | Ht 71.5 in | Wt 286.0 lb

## 2014-01-03 DIAGNOSIS — R32 Unspecified urinary incontinence: Secondary | ICD-10-CM | POA: Insufficient documentation

## 2014-01-03 DIAGNOSIS — G894 Chronic pain syndrome: Secondary | ICD-10-CM

## 2014-01-03 DIAGNOSIS — F329 Major depressive disorder, single episode, unspecified: Secondary | ICD-10-CM | POA: Insufficient documentation

## 2014-01-03 DIAGNOSIS — K219 Gastro-esophageal reflux disease without esophagitis: Secondary | ICD-10-CM | POA: Insufficient documentation

## 2014-01-03 DIAGNOSIS — M549 Dorsalgia, unspecified: Secondary | ICD-10-CM | POA: Insufficient documentation

## 2014-01-03 DIAGNOSIS — M4800 Spinal stenosis, site unspecified: Secondary | ICD-10-CM | POA: Insufficient documentation

## 2014-01-03 DIAGNOSIS — G8929 Other chronic pain: Secondary | ICD-10-CM | POA: Insufficient documentation

## 2014-01-03 DIAGNOSIS — I10 Essential (primary) hypertension: Secondary | ICD-10-CM

## 2014-01-03 DIAGNOSIS — F410 Panic disorder [episodic paroxysmal anxiety] without agoraphobia: Secondary | ICD-10-CM | POA: Insufficient documentation

## 2014-01-03 MED ORDER — TRAMADOL HCL 50 MG PO TABS: 100.0000 mg | ORAL_TABLET | Freq: Four times a day (QID) | ORAL | Status: DC | PRN

## 2014-01-03 MED ORDER — CLONIDINE HCL 0.3 MG PO TABS: 0.3000 mg | ORAL_TABLET | Freq: Two times a day (BID) | ORAL | Status: DC

## 2014-01-03 MED ORDER — METOPROLOL TARTRATE 50 MG PO TABS: 50.0000 mg | ORAL_TABLET | Freq: Two times a day (BID) | ORAL | Status: DC

## 2014-01-03 NOTE — Progress Notes (Signed)
Pt is here following up on his chronic pain in his lower back, legs and feet. Pt states that he has to overdose on the tylenol #3s to help with the pain.

## 2014-01-03 NOTE — Progress Notes (Signed)
Patient ID: Travis [name], male   DOB: 1965-11-05, 48 y.o.   MRN: [id number]   Travis [name], is a 48 y.o. male  EXB:[id number]  NUU:[id number]  DOB - 1965-11-05  Chief Complaint  Patient presents with  . Follow-up        Subjective:   Travis [name] is a 48 y.o. male here today for a follow up visit. PMH significant for HTN, GERD, hypercholesterolemia, depression, panic attacks, chronic pain, and spinal stenosis that is unchanged from July 2014 based on most recent MRI results.  About 2 1/2 months ago, he reports awakening to severe back pain with radiation to bilateral legs.  She has stabbing sensations in his legs.  The pain has been present constantly since then and he was evaluated in ED 10/15 for potential worsening of spinal stenosis.  Results were consistent with previous imaging.  The patient was started on nortriptyline and encouraged to see PCP.  Since starting nortriptyline, he reports improvements in his insomnia but has not seen any change in his pain yet.  He endorses urinary incontinence that is unchanged from his baseline and he is currently being followed by urology.  During his appt he reports being in a panic attack, which he attributes his elevated BP and HR to.  He is compliant with his medications and reports normal BP readings at home when he is not panicked.  He has panic attacks frequently and it is associated with crowds.  He limits his activities because of this.   He is requesting a refill of his metoprolol and clonidine.  No problems updated.  ALLERGIES: Allergies  Allergen Reactions  . Blueberry Flavor Hives  . Other Other (See Comments)    Muscle relaxant Robaxin causes rapid heart rate   . Penicillins Hives, Itching and Other (See Comments)    Hallucinations.  . Robaxin [Methocarbamol] Palpitations  . Flexeril [Cyclobenzaprine] Hives  . Toradol [Ketorolac Tromethamine] Hives and Other (See Comments)    Headache    PAST MEDICAL HISTORY: Past Medical  History  Diagnosis Date  . Hypertension   . Anxiety   . Tachycardia - pulse   . Degenerative disk disease   . Spinal stenosis   . Degenerative disk disease   . Tobacco abuse   . Obesity   . Polysubstance abuse   . Chest pain     Hospital, March, 2014, negative enzymes, patient refused in-hospital  stress test, patient canceled outpatient stress test  . Spinal stenosis     MEDICATIONS AT HOME: Prior to Admission medications   Medication Sig Start Date End Date Taking? Authorizing Provider  acetaminophen-codeine (TYLENOL #3) 300-30 MG per tablet Take 1 tablet by mouth every 8 (eight) hours as needed for moderate pain. 12/21/13  Yes Quentin Angstlugbemiga E Jegede, MD  cloNIDine (CATAPRES) 0.3 MG tablet Take 1 tablet (0.3 mg total) by mouth 2 (two) times daily. For HTN 11/16/13  Yes Quentin Angstlugbemiga E Jegede, MD  diphenhydrAMINE (SOMINEX) 25 MG tablet Take 25 mg by mouth at bedtime as needed for allergies or sleep (allergic reaction).   Yes Historical Provider, MD  lisinopril (PRINIVIL,ZESTRIL) 20 MG tablet Take 1 tablet (20 mg total) by mouth every morning. For high blood pressure 10/12/13  Yes Olugbemiga E Hyman HopesJegede, MD  metoprolol (LOPRESSOR) 50 MG tablet Take 1 tablet (50 mg total) by mouth 2 (two) times daily. For high blood pressure 09/30/13  Yes Sanjuana KavaAgnes I Nwoko, NP  nortriptyline (PAMELOR) 10 MG capsule Take 1 capsule (10 mg total) by mouth at  bedtime. 12/28/13  Yes Rodolph BongEvan S Corey, MD  ranitidine (ZANTAC) 75 MG tablet Take 75 mg by mouth daily as needed for heartburn (heartburn).   Yes Historical Provider, MD  sodium chloride (OCEAN) 0.65 % SOLN nasal spray Place 2 sprays into both nostrils 2 (two) times daily.   Yes Historical Provider, MD  traMADol (ULTRAM) 50 MG tablet Take 100 mg by mouth every 6 (six) hours as needed for moderate pain.   Yes Historical Provider, MD  diazepam (VALIUM) 10 MG tablet Take 10 mg by mouth every 6 (six) hours as needed (for muscle pain).    Historical Provider, MD   Review of  Systems  Constitutional: Negative.   HENT: Negative.   Eyes: Negative.   Respiratory: Negative.   Cardiovascular: Positive for palpitations. Negative for chest pain.  Gastrointestinal: Negative.   Genitourinary: Negative.   Musculoskeletal: Positive for myalgias and back pain.  Skin: Negative.   Neurological: Negative.   Psychiatric/Behavioral: Negative for depression and suicidal ideas. The patient is nervous/anxious and has insomnia.       Objective:   Filed Vitals:   01/03/14 1725  BP: 135/107  Pulse: 118  Temp: 98.3 F (36.8 C)  TempSrc: Oral  Resp: 14  Height: 5' 11.5" (1.816 m)  Weight: 286 lb (129.729 kg)  SpO2: 100%    Exam General appearance : Awake, alert, not in any distress. Speech Clear. Not toxic looking HEENT: Atraumatic and Normocephalic, pupils equally reactive to light and accomodation. tachycardic Neck: supple, no JVD. No cervical lymphadenopathy.  Chest:Good air entry bilaterally, no added sounds  CVS: S1 S2 regular, no murmurs.  Abdomen: Bowel sounds present, Non tender and not distended with no gaurding, rigidity or rebound. Extremities: B/L Lower Ext shows no edema, both legs are warm to touch Neurology: Awake alert, and oriented X 3, CN II-XII intact, Non focal Skin:No Rash Wounds:N/A  Data Review Lab Results  Component Value Date   HGBA1C 5.9 10/12/2013   HGBA1C 5.9* 05/29/2012     Assessment & Plan   1. Chronic pain P: stop Tylenol #3. Restart tramadol 100 mg every 6 hours PRN pain He is currently in the referral process for pain management. Cont nortriptyline- pt was instructed that medications can take 4-6 weeks to see full effects and should continue taking the meds.  Verbalized understanding.   2. HTN P: BP @ recheck was 128/98 with pt still reporting being in a panic state.  Continue current regimen. Refill metoprolol and clonidine.  - We have discussed target BP range and blood pressure goal - I have advised patient to check  BP regularly and to call us back or report to clinic if the numbers are consistently higher than 140/90  - We discussed the importance of compliance with medical therapy and DASH diet recommended, consequences of uncontrolled hypertension discussed.  - continue current BP medications   The patient was given clear instructions to go to ER or return to medical center if symptoms don't improve, worsen or new problems develop. The patient verbalized understanding. The patient was told to call to get lab results if they haven't heard anything in the next week.   This note has been created with Education officer, environmentalDragon speech recognition software and smart phrase technology. Any transcriptional errors are unintentional.     Clydell Sposito, FNP-student  Evaluation and management procedures were performed by the Advanced Practitioner under my supervision and collaboration. I have reviewed the Advanced Practitioner's note and chart, and I agree with the management  and plan.   Jeanann Lewandowsky, MD, MHA, FACP, FAAP [facility] [place], [name] [phone]   01/03/2014, 5:48 PM

## 2014-02-15 ENCOUNTER — Encounter (HOSPITAL_COMMUNITY): Payer: Self-pay | Admitting: Emergency Medicine

## 2014-02-15 ENCOUNTER — Emergency Department (HOSPITAL_COMMUNITY)
Admission: EM | Admit: 2014-02-15 | Discharge: 2014-02-15 | Disposition: A | Discharge status: Home / Self Care | Payer: Self-pay | Attending: Emergency Medicine | Admitting: Emergency Medicine

## 2014-02-15 DIAGNOSIS — R61 Generalized hyperhidrosis: Secondary | ICD-10-CM | POA: Insufficient documentation

## 2014-02-15 DIAGNOSIS — I10 Essential (primary) hypertension: Secondary | ICD-10-CM | POA: Insufficient documentation

## 2014-02-15 DIAGNOSIS — Y9389 Activity, other specified: Secondary | ICD-10-CM | POA: Insufficient documentation

## 2014-02-15 DIAGNOSIS — Z720 Tobacco use: Secondary | ICD-10-CM | POA: Insufficient documentation

## 2014-02-15 DIAGNOSIS — R000 Tachycardia, unspecified: Secondary | ICD-10-CM | POA: Insufficient documentation

## 2014-02-15 DIAGNOSIS — Z79899 Other long term (current) drug therapy: Secondary | ICD-10-CM | POA: Insufficient documentation

## 2014-02-15 DIAGNOSIS — F419 Anxiety disorder, unspecified: Secondary | ICD-10-CM | POA: Insufficient documentation

## 2014-02-15 DIAGNOSIS — S39012A Strain of muscle, fascia and tendon of lower back, initial encounter: Secondary | ICD-10-CM | POA: Insufficient documentation

## 2014-02-15 DIAGNOSIS — Z880 Allergy status to penicillin: Secondary | ICD-10-CM | POA: Insufficient documentation

## 2014-02-15 DIAGNOSIS — Y929 Unspecified place or not applicable: Secondary | ICD-10-CM | POA: Insufficient documentation

## 2014-02-15 DIAGNOSIS — E669 Obesity, unspecified: Secondary | ICD-10-CM | POA: Insufficient documentation

## 2014-02-15 DIAGNOSIS — Y998 Other external cause status: Secondary | ICD-10-CM | POA: Insufficient documentation

## 2014-02-15 DIAGNOSIS — X58XXXA Exposure to other specified factors, initial encounter: Secondary | ICD-10-CM | POA: Insufficient documentation

## 2014-02-15 LAB — I-STAT CHEM 8, ED
BUN: 11 mg/dL (ref 6–23)
CHLORIDE: 97 meq/L (ref 96–112)
Calcium, Ion: 1.13 mmol/L (ref 1.12–1.23)
Creatinine, Ser: 1 mg/dL (ref 0.50–1.35)
GLUCOSE: 111 mg/dL — AB (ref 70–99)
HCT: 54 % — ABNORMAL HIGH (ref 39.0–52.0)
Hemoglobin: 18.4 g/dL — ABNORMAL HIGH (ref 13.0–17.0)
POTASSIUM: 3.5 meq/L — AB (ref 3.7–5.3)
Sodium: 140 mEq/L (ref 137–147)
TCO2: 24 mmol/L (ref 0–100)

## 2014-02-15 LAB — I-STAT TROPONIN, ED: Troponin i, poc: 0.01 ng/mL (ref 0.00–0.08)

## 2014-02-15 MED ORDER — SODIUM CHLORIDE 0.9 % IV BOLUS (SEPSIS)
1000.0000 mL | INTRAVENOUS | Status: AC
  Administered 2014-02-15: 1000 mL via INTRAVENOUS

## 2014-02-15 MED ORDER — HYDROMORPHONE HCL 1 MG/ML IJ SOLN
1.0000 mg | Freq: Once | INTRAMUSCULAR | Status: AC
  Administered 2014-02-15: 1 mg via INTRAVENOUS
  Filled 2014-02-15: qty 1

## 2014-02-15 MED ORDER — OXYCODONE-ACETAMINOPHEN 5-325 MG PO TABS
1.0000 | ORAL_TABLET | Freq: Four times a day (QID) | ORAL | Status: DC | PRN
Start: 2014-02-15 — End: 2014-02-27

## 2014-02-15 MED ORDER — CLONIDINE HCL 0.1 MG PO TABS
0.3000 mg | ORAL_TABLET | Freq: Once | ORAL | Status: AC
  Administered 2014-02-15: 0.3 mg via ORAL
  Filled 2014-02-15: qty 3

## 2014-02-15 MED ORDER — CLONIDINE HCL 0.3 MG PO TABS: 0.3000 mg | ORAL_TABLET | Freq: Two times a day (BID) | ORAL | Status: DC

## 2014-02-15 NOTE — ED Notes (Signed)
Pt states he woke up shaking, his HR was 140 BPM, BP was 172/152.  BP currently 187/127, HR 147. Pt c/o dizziness on standing.

## 2014-02-15 NOTE — ED Notes (Signed)
MD at bedside. EDP HARRISON PRESENT TO EVALUATE THIS PT 

## 2014-02-15 NOTE — ED Provider Notes (Signed)
CSN: [id number]     Arrival date & time 02/15/14  1808 History   First MD Initiated Contact with Patient 02/15/14 1845     Chief Complaint  Patient presents with  . Hypertension     (Consider location/radiation/quality/duration/timing/severity/associated sxs/prior Treatment) Patient is a 48 y.o. male presenting with hypertension and back pain. The history is provided by the patient.  Hypertension This is a chronic problem. The current episode started yesterday. The problem occurs constantly. The problem has not changed since onset.Pertinent negatives include no chest pain, no abdominal pain, no headaches and no shortness of breath. Nothing aggravates the symptoms. He has tried nothing for the symptoms. The treatment provided no relief.  Back Pain Location:  Lumbar spine Quality:  Aching Radiates to: buttocks. Pain severity:  Moderate Pain is:  Same all the time Onset quality:  Sudden Duration:  1 day Timing:  Constant Progression:  Unchanged Chronicity:  Chronic Context comment:  Fell onto his knee jolting his back Relieved by:  Nothing Worsened by:  Nothing tried Ineffective treatments:  None tried Associated symptoms: no abdominal pain, no chest pain, no dysuria, no fever, no headaches and no numbness     Past Medical History  Diagnosis Date  . Hypertension   . Anxiety   . Tachycardia - pulse   . Degenerative disk disease   . Spinal stenosis   . Degenerative disk disease   . Tobacco abuse   . Obesity   . Polysubstance abuse   . Chest pain     Hospital, March, 2014, negative enzymes, patient refused in-hospital  stress test, patient canceled outpatient stress test  . Spinal stenosis    No past surgical history on file. Family History  Problem Relation Age of Onset  . Stroke Mother   . Hypertension Mother   . Stroke Father   . Hypertension Father   . Dementia Father   . Diabetes Father   . Heart disease Father   . Cancer Sister     brain  . Heart disease  Brother    History  Substance Use Topics  . Smoking status: Current Every Day Smoker -- 0.50 packs/day    Types: Cigarettes  . Smokeless tobacco: Never Used  . Alcohol Use: No    Review of Systems  Constitutional: Negative for fever.  HENT: Negative for drooling and rhinorrhea.   Eyes: Negative for pain.  Respiratory: Negative for cough and shortness of breath.   Cardiovascular: Negative for chest pain and leg swelling.  Gastrointestinal: Negative for nausea, vomiting, abdominal pain and diarrhea.  Genitourinary: Negative for dysuria and hematuria.  Musculoskeletal: Positive for back pain. Negative for gait problem and neck pain.  Skin: Negative for color change.  Neurological: Negative for numbness and headaches.  Hematological: Negative for adenopathy.  Psychiatric/Behavioral: Negative for behavioral problems.  All other systems reviewed and are negative.     Allergies  Blueberry flavor; Other; Penicillins; Robaxin; Flexeril; and Toradol  Home Medications   Prior to Admission medications   Medication Sig Start Date End Date Taking? Authorizing Provider  cloNIDine (CATAPRES) 0.3 MG tablet Take 1 tablet (0.3 mg total) by mouth 2 (two) times daily. For HTN 01/03/14   Olugbemiga E Hyman HopesJegede, MD  diazepam (VALIUM) 10 MG tablet Take 10 mg by mouth every 6 (six) hours as needed (for muscle pain).    Historical Provider, MD  diphenhydrAMINE (SOMINEX) 25 MG tablet Take 25 mg by mouth at bedtime as needed for allergies or sleep (allergic reaction).  Historical Provider, MD  lisinopril (PRINIVIL,ZESTRIL) 20 MG tablet Take 1 tablet (20 mg total) by mouth every morning. For high blood pressure 10/12/13   Quentin Angst, MD  metoprolol (LOPRESSOR) 50 MG tablet Take 1 tablet (50 mg total) by mouth 2 (two) times daily. For high blood pressure 01/03/14   Quentin Angst, MD  nortriptyline (PAMELOR) 10 MG capsule Take 1 capsule (10 mg total) by mouth at bedtime. 12/28/13   Rodolph Bong,  MD  ranitidine (ZANTAC) 75 MG tablet Take 75 mg by mouth daily as needed for heartburn (heartburn).    Historical Provider, MD  sodium chloride (OCEAN) 0.65 % SOLN nasal spray Place 2 sprays into both nostrils 2 (two) times daily.    Historical Provider, MD  traMADol (ULTRAM) 50 MG tablet Take 2 tablets (100 mg total) by mouth every 6 (six) hours as needed. 01/03/14   Quentin Angst, MD   BP 163/114 mmHg  Pulse 107  Temp(Src) 98 F (36.7 C)  Resp 10  SpO2 100% Physical Exam  Constitutional: He is oriented to person, place, and time. He appears well-developed and well-nourished.  HENT:  Head: Normocephalic and atraumatic.  Right Ear: External ear normal.  Left Ear: External ear normal.  Nose: Nose normal.  Mouth/Throat: Oropharynx is clear and moist. No oropharyngeal exudate.  Eyes: Conjunctivae and EOM are normal. Pupils are equal, round, and reactive to light.  Neck: Normal range of motion. Neck supple.  Cardiovascular: Normal rate, regular rhythm, normal heart sounds and intact distal pulses.  Exam reveals no gallop and no friction rub.   No murmur heard. Pulmonary/Chest: Effort normal and breath sounds normal. No respiratory distress. He has no wheezes.  Abdominal: Soft. Bowel sounds are normal. He exhibits no distension. There is no tenderness. There is no rebound and no guarding.  Musculoskeletal: Normal range of motion. He exhibits tenderness. He exhibits no edema.  Mild tenderness of the lower midline lumbar spine. Patient notes this is not significantly changed from baseline.  Normal strength and sensation in all extremities.  Neurological: He is alert and oriented to person, place, and time.  Skin: Skin is warm. He is diaphoretic (mild).  Psychiatric: He has a normal mood and affect. His behavior is normal.  Nursing note and vitals reviewed.   ED Course  Procedures (including critical care time) Labs Review Labs Reviewed  I-STAT CHEM 8, ED - Abnormal; Notable for  the following:    Potassium 3.5 (*)    Glucose, Bld 111 (*)    Hemoglobin 18.4 (*)    HCT 54.0 (*)    All other components within normal limits  I-STAT TROPOININ, ED    Imaging Review No results found.   EKG Interpretation   Date/Time:  Tuesday February 15 2014 18:34:01 EST Ventricular Rate:  122 PR Interval:  125 QRS Duration: 99 QT Interval:  401 QTC Calculation: 571 R Axis:     Text Interpretation:  Sinus tachycardia Multiple ventricular premature  complexes Borderline T wave abnormalities Prolonged QT interval PVC's No  significant change since last tracing Reconfirmed by Jaivion Kingsley  MD, Maham Quintin  ([id number]) on 02/15/2014 7:20:50 PM      MDM   Final diagnoses:  Anxiety  Essential hypertension  Tachycardia  Low back strain, initial encounter    7:25 PM 48 y.o. male w hx of HTN, anxiety, polysubs absue, chronic low back pain who presents with multiple complaints. He states that he has been out of his clonidine since yesterday. Last  dose was yesterday morning. He notes that yesterday he had a mechanical fall in his living room pulling down to his knee and jolting his back. He denies any significant injury but states that he has chronic back pain which has worsened since that time. He states that he checked his blood pressure today and it was elevated. He is also had a lot of anxiety today. He denies any chest pain, shortness of breath, headache, or blurry vision. He is tachycardic and hypertensive here. We'll give him clonidine for his blood pressure and a lot of for his back pain. We'll get screening lab work.  9:16 PM: HR decreasing appropriately. Pt feeling much better. I have discussed the diagnosis/risks/treatment options with the patient and believe the pt to be eligible for discharge home to follow-up with his pcp. We also discussed returning to the ED immediately if new or worsening sx occur. We discussed the sx which are most concerning (e.g., worsening back pain, cp, sob)  that necessitate immediate return. Medications administered to the patient during their visit and any new prescriptions provided to the patient are listed below.  Medications given during this visit Medications  HYDROmorphone (DILAUDID) injection 1 mg (not administered)  sodium chloride 0.9 % bolus 1,000 mL (1,000 mLs Intravenous New Bag/Given 02/15/14 1936)  cloNIDine (CATAPRES) tablet 0.3 mg (0.3 mg Oral Given 02/15/14 1935)  HYDROmorphone (DILAUDID) injection 1 mg (1 mg Intravenous Given 02/15/14 1936)    New Prescriptions   CLONIDINE (CATAPRES) 0.3 MG TABLET    Take 1 tablet (0.3 mg total) by mouth 2 (two) times daily.   OXYCODONE-ACETAMINOPHEN (PERCOCET) 5-325 MG PER TABLET    Take 1 tablet by mouth every 6 (six) hours as needed.     Purvis SheffieldForrest Beckey Polkowski, MD 02/15/14 2117

## 2014-02-15 NOTE — ED Notes (Signed)
Triage RN at bedside

## 2014-02-15 NOTE — ED Notes (Addendum)
Pt reports that he has been out of his clonodine blood pressure meds as of yesterday.

## 2014-02-21 ENCOUNTER — Other Ambulatory Visit: Payer: Self-pay | Admitting: Internal Medicine

## 2014-02-22 ENCOUNTER — Telehealth: Payer: Self-pay | Admitting: Internal Medicine

## 2014-02-22 NOTE — Telephone Encounter (Signed)
Patient has called in today to request a medication refill for traMADol (ULTRAM) 50 MG tablet; please f/u with patient   

## 2014-02-23 ENCOUNTER — Telehealth: Payer: Self-pay | Admitting: Internal Medicine

## 2014-02-23 ENCOUNTER — Telehealth: Payer: Self-pay | Admitting: Emergency Medicine

## 2014-02-23 NOTE — Telephone Encounter (Signed)
Pt informed Dr. Hyman HopesJegede will no longer write for Tramadol refills due to pt going to Er multiple times for pain medications. Pt was told to follow up with provided pain referral for chronic pain management.

## 2014-02-27 ENCOUNTER — Encounter (HOSPITAL_COMMUNITY): Payer: Self-pay | Admitting: *Deleted

## 2014-02-27 ENCOUNTER — Emergency Department (HOSPITAL_COMMUNITY)
Admission: EM | Admit: 2014-02-27 | Discharge: 2014-02-27 | Disposition: A | Discharge status: Home / Self Care | Payer: Self-pay | Attending: Emergency Medicine | Admitting: Emergency Medicine

## 2014-02-27 DIAGNOSIS — G8929 Other chronic pain: Secondary | ICD-10-CM | POA: Insufficient documentation

## 2014-02-27 DIAGNOSIS — Y9289 Other specified places as the place of occurrence of the external cause: Secondary | ICD-10-CM | POA: Insufficient documentation

## 2014-02-27 DIAGNOSIS — Y998 Other external cause status: Secondary | ICD-10-CM | POA: Insufficient documentation

## 2014-02-27 DIAGNOSIS — I159 Secondary hypertension, unspecified: Secondary | ICD-10-CM | POA: Insufficient documentation

## 2014-02-27 DIAGNOSIS — F419 Anxiety disorder, unspecified: Secondary | ICD-10-CM | POA: Insufficient documentation

## 2014-02-27 DIAGNOSIS — S3992XA Unspecified injury of lower back, initial encounter: Secondary | ICD-10-CM | POA: Insufficient documentation

## 2014-02-27 DIAGNOSIS — W010XXA Fall on same level from slipping, tripping and stumbling without subsequent striking against object, initial encounter: Secondary | ICD-10-CM | POA: Insufficient documentation

## 2014-02-27 DIAGNOSIS — Z720 Tobacco use: Secondary | ICD-10-CM | POA: Insufficient documentation

## 2014-02-27 DIAGNOSIS — Z8739 Personal history of other diseases of the musculoskeletal system and connective tissue: Secondary | ICD-10-CM | POA: Insufficient documentation

## 2014-02-27 DIAGNOSIS — M549 Dorsalgia, unspecified: Secondary | ICD-10-CM

## 2014-02-27 DIAGNOSIS — S24109A Unspecified injury at unspecified level of thoracic spinal cord, initial encounter: Secondary | ICD-10-CM | POA: Insufficient documentation

## 2014-02-27 DIAGNOSIS — Z880 Allergy status to penicillin: Secondary | ICD-10-CM | POA: Insufficient documentation

## 2014-02-27 DIAGNOSIS — Z79899 Other long term (current) drug therapy: Secondary | ICD-10-CM | POA: Insufficient documentation

## 2014-02-27 DIAGNOSIS — Y9389 Activity, other specified: Secondary | ICD-10-CM | POA: Insufficient documentation

## 2014-02-27 DIAGNOSIS — E669 Obesity, unspecified: Secondary | ICD-10-CM | POA: Insufficient documentation

## 2014-02-27 LAB — BASIC METABOLIC PANEL
ANION GAP: 9 (ref 5–15)
BUN: 9 mg/dL (ref 6–23)
CHLORIDE: 100 meq/L (ref 96–112)
CO2: 27 mmol/L (ref 19–32)
Calcium: 9.2 mg/dL (ref 8.4–10.5)
Creatinine, Ser: 1 mg/dL (ref 0.50–1.35)
GFR calc non Af Amer: 89 mL/min — ABNORMAL LOW (ref >90)
Glucose, Bld: 177 mg/dL — ABNORMAL HIGH (ref 70–99)
Potassium: 3.6 mmol/L (ref 3.5–5.1)
SODIUM: 136 mmol/L (ref 135–145)

## 2014-02-27 LAB — CBC
HCT: 46.5 % (ref 39.0–52.0)
Hemoglobin: 16 g/dL (ref 13.0–17.0)
MCH: 29.4 pg (ref 26.0–34.0)
MCHC: 34.4 g/dL (ref 30.0–36.0)
MCV: 85.3 fL (ref 78.0–100.0)
Platelets: 170 10*3/uL (ref 150–400)
RBC: 5.45 MIL/uL (ref 4.22–5.81)
RDW: 13.4 % (ref 11.5–15.5)
WBC: 8.7 10*3/uL (ref 4.0–10.5)

## 2014-02-27 LAB — I-STAT TROPONIN, ED: TROPONIN I, POC: 0 ng/mL (ref 0.00–0.08)

## 2014-02-27 MED ORDER — CLONIDINE HCL 0.2 MG PO TABS: 0.3000 mg | ORAL_TABLET | Freq: Two times a day (BID) | ORAL | Status: DC

## 2014-02-27 MED ORDER — DIAZEPAM 5 MG/ML IJ SOLN
10.0000 mg | Freq: Once | INTRAMUSCULAR | Status: AC
  Administered 2014-02-27: 10 mg via INTRAMUSCULAR
  Filled 2014-02-27: qty 2

## 2014-02-27 MED ORDER — TRAMADOL HCL 50 MG PO TABS: 50.0000 mg | ORAL_TABLET | Freq: Four times a day (QID) | ORAL | Status: DC | PRN

## 2014-02-27 MED ORDER — ONDANSETRON 8 MG PO TBDP
8.0000 mg | ORAL_TABLET | Freq: Once | ORAL | Status: AC
  Administered 2014-02-27: 8 mg via ORAL
  Filled 2014-02-27: qty 1

## 2014-02-27 MED ORDER — HYDROMORPHONE HCL 2 MG/ML IJ SOLN
2.0000 mg | Freq: Once | INTRAMUSCULAR | Status: AC
  Administered 2014-02-27: 2 mg via INTRAMUSCULAR
  Filled 2014-02-27: qty 1

## 2014-02-27 MED ORDER — DIAZEPAM 10 MG PO TABS
10.0000 mg | ORAL_TABLET | Freq: Three times a day (TID) | ORAL | Status: DC | PRN
Start: 2014-02-27 — End: 2014-04-05

## 2014-02-27 NOTE — Discharge Instructions (Signed)
Back Injury Prevention °Back injuries can be extremely painful and difficult to heal. After having one back injury, you are much more likely to experience another later on. It is important to learn how to avoid injuring or re-injuring your back. The following tips can help you to prevent a back injury. °PHYSICAL FITNESS °· Exercise regularly and try to develop good tone in your abdominal muscles. Your abdominal muscles provide a lot of the support needed by your back. °· Do aerobic exercises (walking, jogging, biking, swimming) regularly. °· Do exercises that increase balance and strength (tai chi, yoga) regularly. This can decrease your risk of falling and injuring your back. °· Stretch before and after exercising. °· Maintain a healthy weight. The more you weigh, the more stress is placed on your back. For every pound of weight, 10 times that amount of pressure is placed on the back. °DIET °· Talk to your caregiver about how much calcium and vitamin D you need per day. These nutrients help to prevent weakening of the bones (osteoporosis). Osteoporosis can cause broken (fractured) bones that lead to back pain. °· Include good sources of calcium in your diet, such as dairy products, green, leafy vegetables, and products with calcium added (fortified). °· Include good sources of vitamin D in your diet, such as milk and foods that are fortified with vitamin D. °· Consider taking a nutritional supplement or a multivitamin if needed. °· Stop smoking if you smoke. °POSTURE °· Sit and stand up straight. Avoid leaning forward when you sit or hunching over when you stand. °· Choose chairs with good low back (lumbar) support. °· If you work at a desk, sit close to your work so you do not need to lean over. Keep your chin tucked in. Keep your neck drawn back and elbows bent at a right angle. Your arms should look like the letter "L." °· Sit high and close to the steering wheel when you drive. Add a lumbar support to your car  seat if needed. °· Avoid sitting or standing in one position for too long. Take breaks to get up, stretch, and walk around at least once every hour. Take breaks if you are driving for long periods of time. °· Sleep on your side with your knees slightly bent, or sleep on your back with a pillow under your knees. Do not sleep on your stomach. °LIFTING, TWISTING, AND REACHING °· Avoid heavy lifting, especially repetitive lifting. If you must do heavy lifting: °· Stretch before lifting. °· Work slowly. °· Rest between lifts. °· Use carts and dollies to move objects when possible. °· Make several small trips instead of carrying 1 heavy load. °· Ask for help when you need it. °· Ask for help when moving big, awkward objects. °· Follow these steps when lifting: °· Stand with your feet shoulder-width apart. °· Get as close to the object as you can. Do not try to pick up heavy objects that are far from your body. °· Use handles or lifting straps if they are available. °· Bend at your knees. Squat down, but keep your heels off the floor. °· Keep your shoulders pulled back, your chin tucked in, and your back straight. °· Lift the object slowly, tightening the muscles in your legs, abdomen, and buttocks. Keep the object as close to the center of your body as possible. °· When you put a load down, use these same guidelines in reverse. °· Do not: °· Lift the object above your waist. °·   Twist at the waist while lifting or carrying a load. Move your feet if you need to turn, not your waist.  Bend over without bending at your knees.  Avoid reaching over your head, across a table, or for an object on a high surface. OTHER TIPS  Avoid wet floors and keep sidewalks clear of ice to prevent falls.  Do not sleep on a mattress that is too soft or too hard.  Keep items that are used frequently within easy reach.  Put heavier objects on shelves at waist level and lighter objects on lower or higher shelves.  Find ways to  decrease your stress, such as exercise, massage, or relaxation techniques. Stress can build up in your muscles. Tense muscles are more vulnerable to injury.  Seek treatment for depression or anxiety if needed. These conditions can increase your risk of developing back pain. SEEK MEDICAL CARE IF:  You injure your back.  You have questions about diet, exercise, or other ways to prevent back injuries. MAKE SURE YOU:  Understand these instructions.  Will watch your condition.  Will get help right away if you are not doing well or get worse. Document Released: 03/28/2004 Document Revised: 05/13/2011 Document Reviewed: 04/01/2011 [facility] Patient Information 2015 [place], [place]. This information is not intended to replace advice given to you by your health care provider. Make sure you discuss any questions you have with your health care provider.  Chronic Back Pain  When back pain lasts longer than 3 months, it is called chronic back pain.People with chronic back pain often go through certain periods that are more intense (flare-ups).  CAUSES Chronic back pain can be caused by wear and tear (degeneration) on different structures in your back. These structures include:  The bones of your spine (vertebrae) and the joints surrounding your spinal cord and nerve roots (facets).  The strong, fibrous tissues that connect your vertebrae (ligaments). Degeneration of these structures may result in pressure on your nerves. This can lead to constant pain. HOME CARE INSTRUCTIONS  Avoid bending, heavy lifting, prolonged sitting, and activities which make the problem worse.  Take brief periods of rest throughout the day to reduce your pain. Lying down or standing usually is better than sitting while you are resting.  Take over-the-counter or prescription medicines only as directed by your caregiver. SEEK IMMEDIATE MEDICAL CARE IF:   You have weakness or numbness in one of your legs or feet.  You  have trouble controlling your bladder or bowels.  You have nausea, vomiting, abdominal pain, shortness of breath, or fainting. Document Released: 03/28/2004 Document Revised: 05/13/2011 Document Reviewed: 02/02/2011 [facility] Patient Information 2015 [place], [place]. This information is not intended to replace advice given to you by your health care provider. Make sure you discuss any questions you have with your health care provider.  Hypertension Hypertension, commonly called high blood pressure, is when the force of blood pumping through your arteries is too strong. Your arteries are the blood vessels that carry blood from your heart throughout your body. A blood pressure reading consists of a higher number over a lower number, such as 110/72. The higher number (systolic) is the pressure inside your arteries when your heart pumps. The lower number (diastolic) is the pressure inside your arteries when your heart relaxes. Ideally you want your blood pressure below 120/80. Hypertension forces your heart to work harder to pump blood. Your arteries may become narrow or stiff. Having hypertension puts you at risk for heart disease, stroke, and  other problems.  RISK FACTORS Some risk factors for high blood pressure are controllable. Others are not.  Risk factors you cannot control include:   Race. You may be at higher risk if you are African American.  Age. Risk increases with age.  Gender. Men are at higher risk than women before age 31 years. After age 64, women are at higher risk than men. Risk factors you can control include:  Not getting enough exercise or physical activity.  Being overweight.  Getting too much fat, sugar, calories, or salt in your diet.  Drinking too much alcohol. SIGNS AND SYMPTOMS Hypertension does not usually cause signs or symptoms. Extremely high blood pressure (hypertensive crisis) may cause headache, anxiety, shortness of breath, and nosebleed. DIAGNOSIS  To  check if you have hypertension, your health care provider will measure your blood pressure while you are seated, with your arm held at the level of your heart. It should be measured at least twice using the same arm. Certain conditions can cause a difference in blood pressure between your right and left arms. A blood pressure reading that is higher than normal on one occasion does not mean that you need treatment. If one blood pressure reading is high, ask your health care provider about having it checked again. TREATMENT  Treating high blood pressure includes making lifestyle changes and possibly taking medicine. Living a healthy lifestyle can help lower high blood pressure. You may need to change some of your habits. Lifestyle changes may include:  Following the DASH diet. This diet is high in fruits, vegetables, and whole grains. It is low in salt, red meat, and added sugars.  Getting at least 2 hours of brisk physical activity every week.  Losing weight if necessary.  Not smoking.  Limiting alcoholic beverages.  Learning ways to reduce stress. If lifestyle changes are not enough to get your blood pressure under control, your health care provider may prescribe medicine. You may need to take more than one. Work closely with your health care provider to understand the risks and benefits. HOME CARE INSTRUCTIONS  Have your blood pressure rechecked as directed by your health care provider.   Take medicines only as directed by your health care provider. Follow the directions carefully. Blood pressure medicines must be taken as prescribed. The medicine does not work as well when you skip doses. Skipping doses also puts you at risk for problems.   Do not smoke.   Monitor your blood pressure at home as directed by your health care provider. SEEK MEDICAL CARE IF:   You think you are having a reaction to medicines taken.  You have recurrent headaches or feel dizzy.  You have swelling in  your ankles.  You have trouble with your vision. SEEK IMMEDIATE MEDICAL CARE IF:  You develop a severe headache or confusion.  You have unusual weakness, numbness, or feel faint.  You have severe chest or abdominal pain.  You vomit repeatedly.  You have trouble breathing. MAKE SURE YOU:   Understand these instructions.  Will watch your condition.  Will get help right away if you are not doing well or get worse. Document Released: 02/18/2005 Document Revised: 07/05/2013 Document Reviewed: 12/11/2012 [facility] Patient Information 2015 [place], [place]. This information is not intended to replace advice given to you by your health care provider. Make sure you discuss any questions you have with your health care provider.

## 2014-02-27 NOTE — ED Notes (Addendum)
Pt reports he slipped and fell in mud yesterday, back pain 10/10, reports last night chest pain. Denies n/v/d. Denies SOB.reports stress test 1 month ago that was clear.

## 2014-02-27 NOTE — ED Provider Notes (Signed)
CSN: [id number]     Arrival date & time 02/27/14  1109 History   First MD Initiated Contact with Patient 02/27/14 1217     Chief Complaint  Patient presents with  . Chest Pain  . Fall     (Consider location/radiation/quality/duration/timing/severity/associated sxs/prior Treatment) HPI   Travis [name] is a 48 y.o. male who presents for evaluation of back pain after a fall yesterday.  He feels like he has reinjured his back.  He has spinal stenosis.  He has chronic numbness in his legs, bilaterally related to L4-5 spinal stenosis.  He feels some increased numbness into his abdomen, since the fall yesterday.  He has been able to walk since then, using his cane, which is his usual.  He also states that his doctor has stopped giving him chronic pain medicines, tramadol, and referred him to pain management.  His appointment with pain management is 03/12/2013.  He was in the ED on 02/15/2014 and received a prescription for oxycodone No. 15, which was 6 days after he received his prescription for tramadol 120 tablets 50 mg on 02/10/2015.  He denies fever, chills, nausea, vomiting, weakness, or dizziness.  There are no other known modifying factors   Past Medical History  Diagnosis Date  . Hypertension   . Anxiety   . Tachycardia - pulse   . Degenerative disk disease   . Spinal stenosis   . Degenerative disk disease   . Tobacco abuse   . Obesity   . Polysubstance abuse   . Chest pain     Hospital, March, 2014, negative enzymes, patient refused in-hospital  stress test, patient canceled outpatient stress test  . Spinal stenosis    History reviewed. No pertinent past surgical history. Family History  Problem Relation Age of Onset  . Stroke Mother   . Hypertension Mother   . Stroke Father   . Hypertension Father   . Dementia Father   . Diabetes Father   . Heart disease Father   . Cancer Sister     brain  . Heart disease Brother    History  Substance Use Topics  . Smoking status:  Current Every Day Smoker -- 0.02 packs/day    Types: Cigarettes  . Smokeless tobacco: Never Used  . Alcohol Use: No    Review of Systems  All other systems reviewed and are negative.     Allergies  Blueberry flavor; Penicillins; Robaxin; Flexeril; and Toradol  Home Medications   Prior to Admission medications   Medication Sig Start Date End Date Taking? Authorizing Provider  cloNIDine (CATAPRES) 0.3 MG tablet Take 1 tablet (0.3 mg total) by mouth 2 (two) times daily. 02/15/14  Yes Purvis SheffieldForrest Harrison, MD  diazepam (VALIUM) 10 MG tablet Take 10 mg by mouth every 6 (six) hours as needed (for muscle pain).   Yes Historical Provider, MD  diphenhydrAMINE (BENADRYL) 25 MG tablet Take 25 mg by mouth every 6 (six) hours as needed for allergies (allergies).   Yes Historical Provider, MD  ibuprofen (ADVIL,MOTRIN) 200 MG tablet Take 800 mg by mouth every 6 (six) hours as needed for headache or moderate pain.   Yes Historical Provider, MD  lisinopril (PRINIVIL,ZESTRIL) 20 MG tablet Take 1 tablet (20 mg total) by mouth every morning. For high blood pressure 10/12/13  Yes Olugbemiga E Hyman HopesJegede, MD  metoprolol (LOPRESSOR) 50 MG tablet Take 1 tablet (50 mg total) by mouth 2 (two) times daily. For high blood pressure 01/03/14  Yes Quentin Angstlugbemiga E Jegede, MD  Omega-3 Fatty Acids (FISH OIL PO) Take 1 tablet by mouth daily.   Yes Historical Provider, MD  ranitidine (ZANTAC) 75 MG tablet Take 75 mg by mouth daily as needed for heartburn (heartburn).   Yes Historical Provider, MD  cloNIDine (CATAPRES) 0.3 MG tablet Take 1 tablet (0.3 mg total) by mouth 2 (two) times daily. For HTN Patient not taking: Reported on 02/27/2014 01/03/14   Quentin Angst, MD  nortriptyline (PAMELOR) 10 MG capsule Take 1 capsule (10 mg total) by mouth at bedtime. Patient not taking: Reported on 02/27/2014 12/28/13   Rodolph Bong, MD  oxyCODONE-acetaminophen (PERCOCET) 5-325 MG per tablet Take 1 tablet by mouth every 6 (six) hours as  needed. Patient not taking: Reported on 02/27/2014 02/15/14   Purvis Sheffield, MD  traMADol (ULTRAM) 50 MG tablet Take 2 tablets (100 mg total) by mouth every 6 (six) hours as needed. Patient not taking: Reported on 02/27/2014 01/03/14   Quentin Angst, MD   BP 160/118 mmHg  Pulse 127  Temp(Src) 97.8 F (36.6 C) (Oral)  Resp 14  SpO2 99% Physical Exam  Constitutional: He is oriented to person, place, and time. He appears well-developed and well-nourished. No distress.  HENT:  Head: Normocephalic and atraumatic.  Right Ear: External ear normal.  Left Ear: External ear normal.  Eyes: Conjunctivae and EOM are normal. Pupils are equal, round, and reactive to light.  Neck: Normal range of motion and phonation normal. Neck supple.  Cardiovascular: Normal rate, regular rhythm and normal heart sounds.   Pulmonary/Chest: Effort normal and breath sounds normal. He exhibits no bony tenderness.  Abdominal: Soft. There is no tenderness.  Musculoskeletal: Normal range of motion.  He is able to sit on the stretcher, on his own.  He has mild, diffuse thoracic and lumbar tenderness to light palpation.  There is no deformity noted of the back.  On examination.  Neurological: He is alert and oriented to person, place, and time. No cranial nerve deficit or sensory deficit. He exhibits normal muscle tone. Coordination normal.  Skin: Skin is warm, dry and intact.  Psychiatric: He has a normal mood and affect. His behavior is normal. Judgment and thought content normal.  Nursing note and vitals reviewed.   ED Course  Procedures (including critical care time) Medications  diazepam (VALIUM) injection 10 mg (10 mg Intramuscular Given 02/27/14 1237)  HYDROmorphone (DILAUDID) injection 2 mg (2 mg Intramuscular Given 02/27/14 1239)  ondansetron (ZOFRAN-ODT) disintegrating tablet 8 mg (8 mg Oral Given 02/27/14 1239)    Patient Vitals for the past 24 hrs:  BP Temp Temp src Pulse Resp SpO2  02/27/14  1123 (!) 160/118 mmHg - - (!) 127 - -  02/27/14 1116 (!) 146/128 mmHg 97.8 F (36.6 C) Oral 113 14 99 %    2:06 PM Reevaluation with update and discussion. After initial assessment and treatment, an updated evaluation reveals he is more comfortable now.  Heart rate is 108, on the cardiac monitor.  Findings discussed with patient, all questions answered.  He requested a refill on his clonidine.  Kacin Dancy L    Labs Review Labs Reviewed  BASIC METABOLIC PANEL - Abnormal; Notable for the following:    Glucose, Bld 177 (*)    GFR calc non Af Amer 89 (*)    All other components within normal limits  CBC  I-STAT TROPOININ, ED    Imaging Review No results found.   EKG Interpretation   Date/Time:  Sunday February 27 2014 11:15:19 EST  Ventricular Rate:  129 PR Interval:  140 QRS Duration: 97 QT Interval:  321 QTC Calculation: 470 R Axis:   -3 Text Interpretation:  Sinus tachycardia Multiple ventricular premature  complexes Probable left atrial enlargement Borderline repolarization  abnormality Baseline wander in lead(s) I II aVR V1 V3 V4 V5 V6 since last  tracing no significant change Confirmed by Effie ShyWENTZ  MD, Mechele CollinELLIOTT ([id number]) on  02/27/2014 12:48:56 PM      MDM   Final diagnoses:  Back injury, initial encounter  Chronic back pain  Secondary hypertension, unspecified    Nonspecific back pain, recurrent.  Patient is due to start pain management soon.  This should hopefully prevent his multiple ER evaluations requiring treatment.  He has hypertension, elevated blood pressure, without signs of end organ damage.   Nursing Notes Reviewed/ Care Coordinated Applicable Imaging Reviewed Interpretation of Laboratory Data incorporated into ED treatment  The patient appears reasonably screened and/or stabilized for discharge and I doubt any other medical condition or other [facility] requiring further screening, evaluation, or treatment in the ED at this time prior to discharge.  Plan:  Home Medications- tramadol, Valium, clonidine; Home Treatments- rest, heat; return here if the recommended treatment, does not improve the symptoms; Recommended follow up- PCP, when necessary.  Pain management as scheduled       Flint MelterElliott L Zackary Mckeone, MD 02/27/14 1655

## 2014-03-16 ENCOUNTER — Encounter (HOSPITAL_COMMUNITY): Payer: Self-pay

## 2014-03-16 ENCOUNTER — Emergency Department (HOSPITAL_COMMUNITY)
Admission: EM | Admit: 2014-03-16 | Discharge: 2014-03-16 | Disposition: A | Discharge status: Home / Self Care | Payer: Self-pay | Attending: Emergency Medicine | Admitting: Emergency Medicine

## 2014-03-16 ENCOUNTER — Emergency Department (HOSPITAL_COMMUNITY): Payer: Self-pay

## 2014-03-16 DIAGNOSIS — I10 Essential (primary) hypertension: Secondary | ICD-10-CM | POA: Insufficient documentation

## 2014-03-16 DIAGNOSIS — G894 Chronic pain syndrome: Secondary | ICD-10-CM

## 2014-03-16 DIAGNOSIS — E669 Obesity, unspecified: Secondary | ICD-10-CM | POA: Insufficient documentation

## 2014-03-16 DIAGNOSIS — Z880 Allergy status to penicillin: Secondary | ICD-10-CM | POA: Insufficient documentation

## 2014-03-16 DIAGNOSIS — F419 Anxiety disorder, unspecified: Secondary | ICD-10-CM | POA: Insufficient documentation

## 2014-03-16 DIAGNOSIS — Z79899 Other long term (current) drug therapy: Secondary | ICD-10-CM | POA: Insufficient documentation

## 2014-03-16 DIAGNOSIS — R0789 Other chest pain: Secondary | ICD-10-CM

## 2014-03-16 DIAGNOSIS — Z8739 Personal history of other diseases of the musculoskeletal system and connective tissue: Secondary | ICD-10-CM | POA: Insufficient documentation

## 2014-03-16 DIAGNOSIS — Z720 Tobacco use: Secondary | ICD-10-CM | POA: Insufficient documentation

## 2014-03-16 LAB — CBC
HCT: 41.7 % (ref 39.0–52.0)
Hemoglobin: 13.2 g/dL (ref 13.0–17.0)
MCH: 28.4 pg (ref 26.0–34.0)
MCHC: 31.7 g/dL (ref 30.0–36.0)
MCV: 89.7 fL (ref 78.0–100.0)
Platelets: 154 10*3/uL (ref 150–400)
RBC: 4.65 MIL/uL (ref 4.22–5.81)
RDW: 13.7 % (ref 11.5–15.5)
WBC: 5.6 10*3/uL (ref 4.0–10.5)

## 2014-03-16 LAB — I-STAT TROPONIN, ED: Troponin i, poc: 0 ng/mL (ref 0.00–0.08)

## 2014-03-16 LAB — BASIC METABOLIC PANEL
ANION GAP: 5 (ref 5–15)
BUN: 9 mg/dL (ref 6–23)
CALCIUM: 8.6 mg/dL (ref 8.4–10.5)
CO2: 32 mmol/L (ref 19–32)
CREATININE: 0.79 mg/dL (ref 0.50–1.35)
Chloride: 98 mEq/L (ref 96–112)
GFR calc Af Amer: >90 mL/min (ref >90)
GFR calc non Af Amer: >90 mL/min (ref >90)
GLUCOSE: 130 mg/dL — AB (ref 70–99)
Potassium: 4 mmol/L (ref 3.5–5.1)
SODIUM: 135 mmol/L (ref 135–145)

## 2014-03-16 MED ORDER — TRAMADOL HCL 50 MG PO TABS: 50.0000 mg | ORAL_TABLET | Freq: Four times a day (QID) | ORAL | Status: DC | PRN

## 2014-03-16 MED ORDER — CLONIDINE HCL 0.2 MG PO TABS: 0.3000 mg | ORAL_TABLET | Freq: Two times a day (BID) | ORAL | Status: DC

## 2014-03-16 NOTE — Discharge Instructions (Signed)
It is important to follow up with her primary care for further evaluation and management of your symptoms. Please take your pain medicines as prescribed. Return to ED for worsening symptoms.

## 2014-03-16 NOTE — ED Provider Notes (Signed)
CSN: [id number]     Arrival date & time 03/16/14  1139 History   First MD Initiated Contact with Patient 03/16/14 1607     Chief Complaint  Patient presents with  . Chest Pain     (Consider location/radiation/quality/duration/timing/severity/associated sxs/prior Treatment) HPI Travis [name] is a 49 y.o. male with a history of chronic back pain comes in for evaluation of chest pain and acute back pain following a fall he incurred yesterday. States yesterday at lunch time he was trying to help his mother with her walker and he tripped and fell flat on his back. He was immediately ambulatory afterward. Denies any numbness or weakness, bowel or bladder incontinence. He is walking with his cane, which is his baseline. He reports waking up this morning at 8:00 and feeling a sharp shooting pain in his right chest that is worsened with movement and better with rest. He has not taken anything for his pain. He denies any associated headache, changes in vision, diaphoresis, nausea or vomiting, diarrhea or constipation. One pack per day smoker.  Past Medical History  Diagnosis Date  . Hypertension   . Anxiety   . Tachycardia - pulse   . Degenerative disk disease   . Spinal stenosis   . Degenerative disk disease   . Tobacco abuse   . Obesity   . Polysubstance abuse   . Chest pain     Hospital, March, 2014, negative enzymes, patient refused in-hospital  stress test, patient canceled outpatient stress test  . Spinal stenosis    History reviewed. No pertinent past surgical history. Family History  Problem Relation Age of Onset  . Stroke Mother   . Hypertension Mother   . Stroke Father   . Hypertension Father   . Dementia Father   . Diabetes Father   . Heart disease Father   . Cancer Sister     brain  . Heart disease Brother    History  Substance Use Topics  . Smoking status: Current Every Day Smoker -- 0.02 packs/day    Types: Cigarettes  . Smokeless tobacco: Never Used  . Alcohol Use:  No    Review of Systems  Constitutional: Negative for fever.  HENT: Negative for sore throat.   Eyes: Negative for visual disturbance.  Respiratory: Negative for shortness of breath.   Cardiovascular: Positive for chest pain.  Gastrointestinal: Negative for abdominal pain.  Endocrine: Negative for polyuria.  Genitourinary: Negative for dysuria.  Musculoskeletal: Positive for back pain.  Skin: Negative for rash.  Neurological: Negative for dizziness, weakness, numbness and headaches.  All other systems reviewed and are negative.     Allergies  Blueberry flavor; Penicillins; Robaxin; Flexeril; and Toradol  Home Medications   Prior to Admission medications   Medication Sig Start Date End Date Taking? Authorizing Provider  diazepam (VALIUM) 10 MG tablet Take 1 tablet (10 mg total) by mouth every 8 (eight) hours as needed (Muscle spasm). 02/27/14  Yes Flint Melter, MD  diphenhydrAMINE (BENADRYL) 25 MG tablet Take 25 mg by mouth every 6 (six) hours as needed for allergies (allergies).   Yes Historical Provider, MD  ibuprofen (ADVIL,MOTRIN) 200 MG tablet Take 800 mg by mouth every 6 (six) hours as needed for headache or moderate pain.   Yes Historical Provider, MD  lisinopril (PRINIVIL,ZESTRIL) 20 MG tablet Take 1 tablet (20 mg total) by mouth every morning. For high blood pressure Patient taking differently: Take 20 mg by mouth 2 (two) times daily. For high blood pressure 10/12/13  Yes Quentin Angst, MD  metoprolol (LOPRESSOR) 50 MG tablet Take 1 tablet (50 mg total) by mouth 2 (two) times daily. For high blood pressure 01/03/14  Yes Olugbemiga E Hyman Hopes, MD  ranitidine (ZANTAC) 75 MG tablet Take 75 mg by mouth daily as needed for heartburn (heartburn).   Yes Historical Provider, MD  cloNIDine (CATAPRES) 0.2 MG tablet Take 1.5 tablets (0.3 mg total) by mouth 2 (two) times daily. 03/16/14   [name], PA-C  traMADol (ULTRAM) 50 MG tablet Take 1 tablet (50 mg total) by mouth  every 6 (six) hours as needed. 03/16/14   [name], PA-C   BP 147/94 mmHg  Pulse 79  Temp(Src) 98.5 F (36.9 C) (Oral)  Resp 26  SpO2 97% Physical Exam  Constitutional: He is oriented to person, place, and time. He appears well-developed and well-nourished.  Obese  HENT:  Head: Normocephalic and atraumatic.  Mouth/Throat: Oropharynx is clear and moist.  Eyes: Conjunctivae are normal. Pupils are equal, round, and reactive to light. Right eye exhibits no discharge. Left eye exhibits no discharge. No scleral icterus.  Neck: Normal range of motion. Neck supple.  Cardiovascular: Normal rate, regular rhythm and normal heart sounds.   Pulmonary/Chest: Effort normal and breath sounds normal. No respiratory distress. He has no wheezes. He has no rales.  Palpation of R pectoral muscle near sternum reproduces 'exact' pain pt felt earlier this morning and throughout today.  Abdominal: Soft. There is no tenderness.  Musculoskeletal: He exhibits no tenderness.  Diffuse thoracic tenderness with no overt midline bony tenderness. Maintains full active range of motion of cervical, thoracic and lumbar spine without difficulty.  Neurological: He is alert and oriented to person, place, and time.  Cranial Nerves II-XII grossly intact. Motor and sensation 5/5 in all 4 extremities. Gait is baseline.  Skin: Skin is warm and dry. No rash noted.  Psychiatric: He has a normal mood and affect.  Nursing note and vitals reviewed.   ED Course  Procedures (including critical care time) Labs Review Labs Reviewed  BASIC METABOLIC PANEL - Abnormal; Notable for the following:    Glucose, Bld 130 (*)    All other components within normal limits  CBC  I-STAT TROPOININ, ED    Imaging Review Dg Chest 2 View  03/16/2014   CLINICAL DATA:  Mid chest pain that began this morning, hypertension, tachycardia, smoker  EXAM: CHEST  2 VIEW  COMPARISON:  05/30/2013  FINDINGS: Upper normal heart size.  Normal  mediastinal contours and pulmonary vascularity.  Minimal atelectasis or scarring in lingula little changed.  Remaining lungs clear.  No pleural effusion or pneumothorax.  Bones unremarkable.  IMPRESSION: Minimal lingular atelectasis versus scarring.  Otherwise negative exam.   Electronically Signed   By: Ulyses Southward M.D.   On: 03/16/2014 18:18     EKG Interpretation None       Date: (<PARAMETER> error)@  Rate: 77  Rhythm: normal sinus rhythm  QRS Axis: normal  Intervals: normal  ST/T Wave abnormalities: normal  Conduction Disutrbances:none  Narrative Interpretation:   Old EKG Reviewed: unchanged   Filed Vitals:   03/16/14 1715 03/16/14 1720 03/16/14 1730 03/16/14 1810  BP:  150/98 147/94   Pulse:  79 77 79  Temp:  98.5 F (36.9 C)    TempSrc:      Resp: SpO2:  99%  97%   Meds given in ED:  Medications - No data to display  Discharge Medication  List as of 03/16/2014  7:42 PM      MDM  Vitals stable - WNL -afebrile Pt resting comfortably in ED. no apparent distress PE--diffuse mild tenderness throughout thoracic region with no focal tenderness or midline bony tenderness. Palpation of right pectoral muscle at sternum reproduces sharp pain patient experienced earlier today. Not concerning for any acute or emergent pathology. Labwork-noncontributory, troponin negative, EKG not concerning. Imaging--CXR shows no acute cardiopulmonary pathology.  DDX--chest pain likely musculoskeletal in nature, not consistent with ACS, dissection. Low HEART Score. Back pain is an acute exacerbation of a chronic condition. Patient has appointment with pain clinic as well as follow-up with his primary care doctor in 10 days. Will DC with short course Tramadol until pain clinic appt and BP meds. I discussed all relevant lab findings and imaging results with pt and they verbalized understanding. Discussed f/u with PCP within 48 hrs and return precautions, pt very amenable to  plan.   Final diagnoses:  Chronic pain syndrome  Chest wall pain        Sharlene MottsBenjamin W Kenae Lindquist, PA-C 03/17/14 1155  Mirian MoMatthew Gentry, MD 03/18/14 [id number]

## 2014-03-16 NOTE — ED Notes (Signed)
Pt presents with c/o chest pain in the center of his chest that started today. Pt reports some dizziness with the pain as well. Pt reports that he also fell yesterday, hx of spinal stenosis. Pt reports the pain feels like a shooting pain, pressure as well.

## 2014-03-16 NOTE — ED Notes (Signed)
Pt alert and oriented x4. Respirations even and unlabored, bilateral symmetrical rise and fall of chest. Skin warm and dry. In no acute distress. Denies needs.   

## 2014-03-27 ENCOUNTER — Emergency Department (HOSPITAL_COMMUNITY): Payer: Self-pay

## 2014-03-27 ENCOUNTER — Encounter (HOSPITAL_COMMUNITY): Payer: Self-pay | Admitting: Family Medicine

## 2014-03-27 ENCOUNTER — Emergency Department (HOSPITAL_COMMUNITY)
Admission: EM | Admit: 2014-03-27 | Discharge: 2014-03-27 | Disposition: A | Discharge status: Home / Self Care | Payer: Self-pay | Attending: Emergency Medicine | Admitting: Emergency Medicine

## 2014-03-27 DIAGNOSIS — Z8739 Personal history of other diseases of the musculoskeletal system and connective tissue: Secondary | ICD-10-CM | POA: Insufficient documentation

## 2014-03-27 DIAGNOSIS — S3992XA Unspecified injury of lower back, initial encounter: Secondary | ICD-10-CM | POA: Insufficient documentation

## 2014-03-27 DIAGNOSIS — W000XXA Fall on same level due to ice and snow, initial encounter: Secondary | ICD-10-CM | POA: Insufficient documentation

## 2014-03-27 DIAGNOSIS — Z720 Tobacco use: Secondary | ICD-10-CM | POA: Insufficient documentation

## 2014-03-27 DIAGNOSIS — Z79899 Other long term (current) drug therapy: Secondary | ICD-10-CM | POA: Insufficient documentation

## 2014-03-27 DIAGNOSIS — F419 Anxiety disorder, unspecified: Secondary | ICD-10-CM | POA: Insufficient documentation

## 2014-03-27 DIAGNOSIS — E669 Obesity, unspecified: Secondary | ICD-10-CM | POA: Insufficient documentation

## 2014-03-27 DIAGNOSIS — Y998 Other external cause status: Secondary | ICD-10-CM | POA: Insufficient documentation

## 2014-03-27 DIAGNOSIS — M545 Low back pain, unspecified: Secondary | ICD-10-CM

## 2014-03-27 DIAGNOSIS — I10 Essential (primary) hypertension: Secondary | ICD-10-CM | POA: Insufficient documentation

## 2014-03-27 DIAGNOSIS — Z880 Allergy status to penicillin: Secondary | ICD-10-CM | POA: Insufficient documentation

## 2014-03-27 DIAGNOSIS — Y9389 Activity, other specified: Secondary | ICD-10-CM | POA: Insufficient documentation

## 2014-03-27 DIAGNOSIS — W19XXXA Unspecified fall, initial encounter: Secondary | ICD-10-CM

## 2014-03-27 DIAGNOSIS — R200 Anesthesia of skin: Secondary | ICD-10-CM | POA: Insufficient documentation

## 2014-03-27 DIAGNOSIS — Y9289 Other specified places as the place of occurrence of the external cause: Secondary | ICD-10-CM | POA: Insufficient documentation

## 2014-03-27 DIAGNOSIS — G8929 Other chronic pain: Secondary | ICD-10-CM | POA: Insufficient documentation

## 2014-03-27 MED ORDER — HYDROCODONE-ACETAMINOPHEN 5-325 MG PO TABS: 2.0000 | ORAL_TABLET | Freq: Once | ORAL | Status: DC

## 2014-03-27 MED ORDER — TRAMADOL HCL 50 MG PO TABS
50.0000 mg | ORAL_TABLET | Freq: Once | ORAL | Status: AC
  Administered 2014-03-27: 50 mg via ORAL
  Filled 2014-03-27: qty 1

## 2014-03-27 MED ORDER — TRAMADOL HCL 50 MG PO TABS: 50.0000 mg | ORAL_TABLET | Freq: Four times a day (QID) | ORAL | Status: DC | PRN

## 2014-03-27 NOTE — ED Notes (Signed)
Per pt sts slipped and fell on the ice yesterday and having lower back pain. Hx of chronic back pain.

## 2014-03-27 NOTE — ED Provider Notes (Signed)
CSN: [id number]     Arrival date & time 03/27/14  1413 History   This chart was scribed for non-physician practitioner Santiago GladHeather Miaisabella Bacorn, PA-C working with Audree CamelScott T Goldston, MD by Conchita ParisNadim Abuhashem, ED Scribe. [facility] and the patient's care was started at 3:49 PM.   Chief Complaint  Patient presents with  . Back Pain   Patient is a 49 y.o. male presenting with back pain. The history is provided by the patient. No language interpreter was used.  Back Pain Associated symptoms: no fever     HPI Comments: Travis [name] is a 49 y.o. male with a history of chronic back pain who presents to the Emergency Department complaining of mid back pain that radiates to his tail bone due to a fall that occurred yesterday.  He reports that he slipped on some ice and landed on his buttocks.   He has taken tramadol for no relief.  He denies recent urine or bowel incontinence and fever.  He reports numbness and tingling of both legs at baseline, but denies any new numbness or tingling.    Past Medical History  Diagnosis Date  . Hypertension   . Anxiety   . Tachycardia - pulse   . Degenerative disk disease   . Spinal stenosis   . Degenerative disk disease   . Tobacco abuse   . Obesity   . Polysubstance abuse   . Chest pain     Hospital, March, 2014, negative enzymes, patient refused in-hospital  stress test, patient canceled outpatient stress test  . Spinal stenosis    History reviewed. No pertinent past surgical history. Family History  Problem Relation Age of Onset  . Stroke Mother   . Hypertension Mother   . Stroke Father   . Hypertension Father   . Dementia Father   . Diabetes Father   . Heart disease Father   . Cancer Sister     brain  . Heart disease Brother    History  Substance Use Topics  . Smoking status: Current Every Day Smoker -- 0.02 packs/day    Types: Cigarettes  . Smokeless tobacco: Never Used  . Alcohol Use: No    Review of Systems   Constitutional: Negative for fever.  Musculoskeletal: Positive for back pain and gait problem.      Allergies  Blueberry flavor; Penicillins; Robaxin; Flexeril; and Toradol  Home Medications   Prior to Admission medications   Medication Sig Start Date End Date Taking? Authorizing Provider  cloNIDine (CATAPRES) 0.2 MG tablet Take 1.5 tablets (0.3 mg total) by mouth 2 (two) times daily. 03/16/14   Earle GellBenjamin W Cartner, PA-C  diazepam (VALIUM) 10 MG tablet Take 1 tablet (10 mg total) by mouth every 8 (eight) hours as needed (Muscle spasm). 02/27/14   Flint MelterElliott L Wentz, MD  diphenhydrAMINE (BENADRYL) 25 MG tablet Take 25 mg by mouth every 6 (six) hours as needed for allergies (allergies).    Historical Provider, MD  ibuprofen (ADVIL,MOTRIN) 200 MG tablet Take 800 mg by mouth every 6 (six) hours as needed for headache or moderate pain.    Historical Provider, MD  lisinopril (PRINIVIL,ZESTRIL) 20 MG tablet Take 1 tablet (20 mg total) by mouth every morning. For high blood pressure Patient taking differently: Take 20 mg by mouth 2 (two) times daily. For high blood pressure 10/12/13   Quentin Angstlugbemiga E Jegede, MD  metoprolol (LOPRESSOR) 50 MG tablet Take 1 tablet (50 mg total) by mouth 2 (two) times daily. For  high blood pressure 01/03/14   Quentin Angst, MD  ranitidine (ZANTAC) 75 MG tablet Take 75 mg by mouth daily as needed for heartburn (heartburn).    Historical Provider, MD  traMADol (ULTRAM) 50 MG tablet Take 1 tablet (50 mg total) by mouth every 6 (six) hours as needed. 03/16/14   Earle Gell Cartner, PA-C   BP 159/98 mmHg  Pulse 86  Temp(Src) 98.8 F (37.1 C) (Oral)  Resp 20  Ht 6' (1.829 m)  Wt 280 lb (127.007 kg)  BMI 37.97 kg/m2  SpO2 98% Physical Exam  Constitutional: He is oriented to person, place, and time. He appears well-developed and well-nourished. No distress.  HENT:  Head: Normocephalic and atraumatic.  Eyes: EOM are normal. Pupils are equal, round, and reactive to light.   Neck: Normal range of motion. Neck supple.  Cardiovascular: Normal rate and regular rhythm.   Pulses:      Dorsalis pedis pulses are 2+ on the right side, and 2+ on the left side.  Pulmonary/Chest: Effort normal and breath sounds normal. No respiratory distress.  Musculoskeletal: Normal range of motion.  TTP at the lumbar spine to the coccyx No bruising, erythema, step off or deformity Normal muscle strength of the lower extremities.  Neurological: He is alert and oriented to person, place, and time. Gait normal.  Reflex Scores:      Patellar reflexes are 2+ on the right side and 2+ on the left side. Distal sensation of both feet intact.  Skin: Skin is warm and dry.  Psychiatric: He has a normal mood and affect. His behavior is normal.  Nursing note and vitals reviewed.   ED Course  Procedures  DIAGNOSTIC STUDIES: Oxygen Saturation is 98% on room air, normal by my interpretation.    COORDINATION OF CARE: 3:56 PM Discussed treatment plan with pt at bedside and pt agreed to plan.  Labs Review Labs Reviewed - No data to display  Imaging Review    EKG Interpretation None      MDM   Final diagnoses:  None   Patient presents with pain of his lumbar spine and coccyx that has been present since a mechanical fall yesterday.  Xrays negative.   No neurological deficits and normal neuro exam.  Patient can walk but states is painful.  No loss of bowel or bladder control.  No concern for cauda equina.  No fever, night sweats, weight loss, h/o cancer.  RICE protocol and pain medicine indicated and discussed with patient.  Patient stable for discharge.  Return precautions given.      Santiago Glad, PA-C 03/29/14 2206  Audree Camel, MD 04/01/14 [phone]

## 2014-03-27 NOTE — Discharge Instructions (Signed)
Take pain medication as prescribed.  Do not drive or operate heavy machinery for 4-6 hours after taking medication. °

## 2014-03-28 ENCOUNTER — Encounter (HOSPITAL_COMMUNITY): Payer: Self-pay | Admitting: Emergency Medicine

## 2014-03-28 ENCOUNTER — Emergency Department (HOSPITAL_COMMUNITY)
Admission: EM | Admit: 2014-03-28 | Discharge: 2014-03-29 | Disposition: A | Discharge status: Home / Self Care | Payer: Self-pay | Attending: Emergency Medicine | Admitting: Emergency Medicine

## 2014-03-28 ENCOUNTER — Emergency Department (HOSPITAL_COMMUNITY): Payer: Self-pay

## 2014-03-28 DIAGNOSIS — Z79899 Other long term (current) drug therapy: Secondary | ICD-10-CM | POA: Insufficient documentation

## 2014-03-28 DIAGNOSIS — E669 Obesity, unspecified: Secondary | ICD-10-CM | POA: Insufficient documentation

## 2014-03-28 DIAGNOSIS — R110 Nausea: Secondary | ICD-10-CM | POA: Insufficient documentation

## 2014-03-28 DIAGNOSIS — R0789 Other chest pain: Secondary | ICD-10-CM | POA: Insufficient documentation

## 2014-03-28 DIAGNOSIS — Z720 Tobacco use: Secondary | ICD-10-CM | POA: Insufficient documentation

## 2014-03-28 DIAGNOSIS — I10 Essential (primary) hypertension: Secondary | ICD-10-CM | POA: Insufficient documentation

## 2014-03-28 DIAGNOSIS — F410 Panic disorder [episodic paroxysmal anxiety] without agoraphobia: Secondary | ICD-10-CM

## 2014-03-28 DIAGNOSIS — F419 Anxiety disorder, unspecified: Secondary | ICD-10-CM | POA: Insufficient documentation

## 2014-03-28 DIAGNOSIS — Z8739 Personal history of other diseases of the musculoskeletal system and connective tissue: Secondary | ICD-10-CM | POA: Insufficient documentation

## 2014-03-28 DIAGNOSIS — Z880 Allergy status to penicillin: Secondary | ICD-10-CM | POA: Insufficient documentation

## 2014-03-28 LAB — URINALYSIS, ROUTINE W REFLEX MICROSCOPIC
BILIRUBIN URINE: NEGATIVE
GLUCOSE, UA: NEGATIVE mg/dL
HGB URINE DIPSTICK: NEGATIVE
KETONES UR: NEGATIVE mg/dL
Leukocytes, UA: NEGATIVE
Nitrite: NEGATIVE
PH: 5.5 (ref 5.0–8.0)
PROTEIN: NEGATIVE mg/dL
Specific Gravity, Urine: 1.022 (ref 1.005–1.030)
Urobilinogen, UA: 0.2 mg/dL (ref 0.0–1.0)

## 2014-03-28 LAB — KETONES, QUALITATIVE: ACETONE BLD: NEGATIVE

## 2014-03-28 LAB — BASIC METABOLIC PANEL
ANION GAP: 19 — AB (ref 5–15)
BUN: 10 mg/dL (ref 6–23)
CALCIUM: 9.4 mg/dL (ref 8.4–10.5)
CHLORIDE: 106 mmol/L (ref 96–112)
CO2: 18 mmol/L — ABNORMAL LOW (ref 19–32)
CREATININE: 0.96 mg/dL (ref 0.50–1.35)
GFR calc Af Amer: >90 mL/min (ref >90)
GFR calc non Af Amer: >90 mL/min (ref >90)
GLUCOSE: 117 mg/dL — AB (ref 70–99)
Potassium: 4.1 mmol/L (ref 3.5–5.1)
Sodium: 143 mmol/L (ref 135–145)

## 2014-03-28 LAB — I-STAT TROPONIN, ED
Troponin i, poc: 0 ng/mL (ref 0.00–0.08)
Troponin i, poc: 0 ng/mL (ref 0.00–0.08)

## 2014-03-28 LAB — CBC
HCT: 48.4 % (ref 39.0–52.0)
Hemoglobin: 16.4 g/dL (ref 13.0–17.0)
MCH: 29.2 pg (ref 26.0–34.0)
MCHC: 33.9 g/dL (ref 30.0–36.0)
MCV: 86.3 fL (ref 78.0–100.0)
Platelets: 192 10*3/uL (ref 150–400)
RBC: 5.61 MIL/uL (ref 4.22–5.81)
RDW: 13.3 % (ref 11.5–15.5)
WBC: 9.2 10*3/uL (ref 4.0–10.5)

## 2014-03-28 MED ORDER — ONDANSETRON HCL 8 MG PO TABS: 8.0000 mg | ORAL_TABLET | Freq: Three times a day (TID) | ORAL | Status: DC | PRN

## 2014-03-28 MED ORDER — LORAZEPAM 1 MG PO TABS
1.0000 mg | ORAL_TABLET | Freq: Once | ORAL | Status: AC
  Administered 2014-03-28: 1 mg via ORAL
  Filled 2014-03-28: qty 1

## 2014-03-28 MED ORDER — ASPIRIN 325 MG PO TABS
325.0000 mg | ORAL_TABLET | Freq: Once | ORAL | Status: AC
  Administered 2014-03-28: 325 mg via ORAL
  Filled 2014-03-28: qty 1

## 2014-03-28 MED ORDER — LORAZEPAM 1 MG PO TABS: 1.0000 mg | ORAL_TABLET | Freq: Four times a day (QID) | ORAL | Status: DC | PRN

## 2014-03-28 MED ORDER — ONDANSETRON 8 MG PO TBDP
8.0000 mg | ORAL_TABLET | Freq: Once | ORAL | Status: AC
  Administered 2014-03-28: 8 mg via ORAL
  Filled 2014-03-28: qty 1

## 2014-03-28 MED ORDER — METOPROLOL TARTRATE 50 MG PO TABS
50.0000 mg | ORAL_TABLET | Freq: Two times a day (BID) | ORAL | Status: DC
Start: 2014-03-28 — End: 2014-04-05

## 2014-03-28 MED ORDER — CLONIDINE HCL 0.2 MG PO TABS
0.3000 mg | ORAL_TABLET | Freq: Two times a day (BID) | ORAL | Status: DC
Start: 2014-03-28 — End: 2014-04-05

## 2014-03-28 NOTE — Progress Notes (Signed)
EDCM spoke to patient at bedside.  Patient reports he is no longer [facility]  [facility] for the orange card on Feb 1st.  [facility] if [facility] card at that location, that will be his pcp.  Patient is aware of this.  [facility] at 1130am.  Patient stated, "I didn't make that appointment, he did."  No further EDCM needs at this time.

## 2014-03-28 NOTE — ED Notes (Signed)
Patient provided sandwich and PO fluids.

## 2014-03-28 NOTE — ED Notes (Signed)
Pt c/o central chest pain that has been intermittent for the past 2 hours that is sharp while lying down after he found out that his sister past away.  Pt states that he had dizziness and nausea with the onset of chest pain.

## 2014-03-28 NOTE — ED Provider Notes (Signed)
CSN: [id number]     Arrival date & time 03/28/14  1732 History   First MD Initiated Contact with Patient 03/28/14 2109     Chief Complaint  Patient presents with  . Chest Pain     (Consider location/radiation/quality/duration/timing/severity/associated sxs/prior Treatment) HPI Comments: Travis [name] is a 49 y.o. male with a PMHx of HTN, anxiety, tachycardia, DDD, spinal stenosis, tobacco use, polysubstance use, and prior episode of CP in 05/2012 with negative delta-trop and neg stress test, who presents to the ED with complaints of chest pain. Began 4 hours prior to arrival after he found out that his sister who passed away. He was at rest when the chest pain occurred. He describes the pain as 8/10 central sharp nonradiating pain which comes intermittently, with no known aggravating or alleviating factors, no medications tried prior to arrival, and the pain has currently subsided since his arrival. He states that he believes this is anxiety attack, and that he does take medications for anxiety. States that when he developed this chest pain, he did feel dizzy and nauseous, which are both resolved at this time. He denies any fevers, chills, shortness of breath, hemoptysis, cough, orthopnea, diaphoresis, claudication, PND, lower extremity swelling, IV drug use, recent travel/immobilization/surgeries, abdominal pain, vomiting, diarrhea, constipation, dysuria, hematuria, numbness, tingling, weakness, or family history of DVT/PE. He does have a family history of heart attacks in both his mother and father. He has no prior known cardiac disease in himself, and states that several years ago he had a negative stress test. His symptoms are currently all resolved at this time. He feels this is all related to his anxiety attack after hearing the news of his sister's death.  Of note, he ran out of clonidine and metoprolol but is taking his lisinopril. Needs refills.  Patient is a 49 y.o. male presenting with chest  pain. The history is provided by the patient. No language interpreter was used.  Chest Pain Pain location:  Substernal area Pain quality: sharp   Pain radiates to:  Does not radiate Pain radiates to the back: no   Pain severity:  Moderate Onset quality:  Sudden Duration:  4 hours Timing:  Intermittent Progression:  Resolved Chronicity:  New Context: stress   Relieved by:  None tried Worsened by:  Nothing tried Ineffective treatments:  None tried Associated symptoms: anxiety, dizziness (now resolved) and nausea (now resolved)   Associated symptoms: no abdominal pain, no altered mental status, no back pain, no claudication, no cough, no diaphoresis, no dysphagia, no fever, no headache, no heartburn, no lower extremity edema, no near-syncope, no numbness, no orthopnea, no palpitations, no PND, no shortness of breath, no syncope, not vomiting and no weakness   Risk factors: hypertension, male sex and smoking   Risk factors: no coronary artery disease and no prior DVT/PE     Past Medical History  Diagnosis Date  . Hypertension   . Anxiety   . Tachycardia - pulse   . Degenerative disk disease   . Spinal stenosis   . Degenerative disk disease   . Tobacco abuse   . Obesity   . Polysubstance abuse   . Chest pain     Hospital, March, 2014, negative enzymes, patient refused in-hospital  stress test, patient canceled outpatient stress test  . Spinal stenosis    History reviewed. No pertinent past surgical history. Family History  Problem Relation Age of Onset  . Stroke Mother   . Hypertension Mother   . Stroke Father   .  Hypertension Father   . Dementia Father   . Diabetes Father   . Heart disease Father   . Cancer Sister     brain  . Heart disease Brother    History  Substance Use Topics  . Smoking status: Current Every Day Smoker -- 0.02 packs/day    Types: Cigarettes  . Smokeless tobacco: Never Used  . Alcohol Use: No    Review of Systems  Constitutional: Negative  for fever, chills and diaphoresis.  HENT: Negative for trouble swallowing.   Respiratory: Negative for cough and shortness of breath.   Cardiovascular: Positive for chest pain. Negative for palpitations, orthopnea, claudication, leg swelling, syncope, PND and near-syncope.  Gastrointestinal: Positive for nausea (now resolved). Negative for heartburn, vomiting, abdominal pain, diarrhea and constipation.  Genitourinary: Negative for dysuria, urgency and hematuria.  Musculoskeletal: Negative for myalgias, back pain, arthralgias and neck pain.  Skin: Negative for color change.  Neurological: Positive for dizziness (now resolved). Negative for syncope, weakness, light-headedness, numbness and headaches.  Psychiatric/Behavioral: Negative for confusion.   10 Systems reviewed and are negative for acute change except as noted in the HPI.    Allergies  Blueberry flavor; Penicillins; Robaxin; Flexeril; and Toradol  Home Medications   Prior to Admission medications   Medication Sig Start Date End Date Taking? Authorizing Provider  cloNIDine (CATAPRES) 0.2 MG tablet Take 1.5 tablets (0.3 mg total) by mouth 2 (two) times daily. 03/16/14   Earle Gell Cartner, PA-C  diazepam (VALIUM) 10 MG tablet Take 1 tablet (10 mg total) by mouth every 8 (eight) hours as needed (Muscle spasm). 02/27/14   Flint Melter, MD  diphenhydrAMINE (BENADRYL) 25 MG tablet Take 25 mg by mouth every 6 (six) hours as needed for allergies (allergies).    Historical Provider, MD  ibuprofen (ADVIL,MOTRIN) 200 MG tablet Take 800 mg by mouth every 6 (six) hours as needed for headache or moderate pain.    Historical Provider, MD  lisinopril (PRINIVIL,ZESTRIL) 20 MG tablet Take 1 tablet (20 mg total) by mouth every morning. For high blood pressure Patient taking differently: Take 20 mg by mouth 2 (two) times daily. For high blood pressure 10/12/13   Quentin Angst, MD  metoprolol (LOPRESSOR) 50 MG tablet Take 1 tablet (50 mg total)  by mouth 2 (two) times daily. For high blood pressure 01/03/14   Quentin Angst, MD  ranitidine (ZANTAC) 75 MG tablet Take 75 mg by mouth daily as needed for heartburn (heartburn).    Historical Provider, MD  traMADol (ULTRAM) 50 MG tablet Take 1 tablet (50 mg total) by mouth every 6 (six) hours as needed. 03/27/14   Heather Laisure, PA-C   BP 166/92 mmHg  Pulse 57  Temp(Src) 98.2 F (36.8 C) (Oral)  Resp 18  SpO2 99% Physical Exam  Constitutional: He is oriented to person, place, and time. Vital signs are normal. He appears well-developed and well-nourished.  Non-toxic appearance. No distress.  Afebrile, nontoxic, NAD, tearful, VSS with baseline HTN noted  HENT:  Head: Normocephalic and atraumatic.  Mouth/Throat: Oropharynx is clear and moist and mucous membranes are normal.  Eyes: Conjunctivae and EOM are normal. Right eye exhibits no discharge. Left eye exhibits no discharge.  Neck: Normal range of motion. Neck supple.  Cardiovascular: Normal rate, regular rhythm, normal heart sounds and intact distal pulses.  Exam reveals no gallop and no friction rub.   No murmur heard. RRR, nl s1/s2, no m/r/g, distal pulses intact, no pedal edema  Pulmonary/Chest: Effort normal  and breath sounds normal. No respiratory distress. He has no decreased breath sounds. He has no wheezes. He has no rhonchi. He has no rales. He exhibits no tenderness, no bony tenderness and no crepitus.  CTAB in all lung fields, no w/r/r, no hypoxia or increased WOB, speaking in full sentences No chest wall TTP or crepitus  Abdominal: Soft. Normal appearance and bowel sounds are normal. He exhibits no distension. There is no tenderness. There is no rigidity, no rebound and no guarding.  Musculoskeletal: Normal range of motion.  MAE x4 Baseline strength and sensation intact No pedal edema  Neurological: He is alert and oriented to person, place, and time. He has normal strength. No sensory deficit. Gait normal.  Gait  steady Strength and sensation at baseline  Skin: Skin is warm, dry and intact. No rash noted.  Psychiatric: He has a normal mood and affect.  Sad and tearful  Nursing note and vitals reviewed.   ED Course  Procedures (including critical care time) Labs Review Labs Reviewed  BASIC METABOLIC PANEL - Abnormal; Notable for the following:    CO2 18 (*)    Glucose, Bld 117 (*)    Anion gap 19 (*)    All other components within normal limits  URINALYSIS, ROUTINE W REFLEX MICROSCOPIC - Abnormal; Notable for the following:    Color, Urine AMBER (*)    All other components within normal limits  CBC  KETONES, QUALITATIVE  I-STAT TROPOININ, ED  Rosezena Sensor, ED    Imaging Review Dg Chest 2 View  03/28/2014   CLINICAL DATA:  49 year old male with history of mid chest pain for the past 2-1/2 hr.  EXAM: CHEST  2 VIEW  COMPARISON:  Chest x-ray 03/16/2014.  FINDINGS: Small amount of lingular scarring is unchanged. Low lung volumes. No acute consolidative airspace disease. No pleural effusions. Ill-defined 12 mm nodular opacity overlying the right mid to upper lung (between the anterior aspects of the right second and third rib). No evidence of pulmonary edema. Heart size is normal. Upper mediastinal contours are within normal limits.  IMPRESSION: 1. No radiographic evidence of acute cardiopulmonary disease. 2. Linear scarring in the lingula is unchanged. 3. 12 mm nodular density projecting over the right mid to upper lung. This is new compared to prior examinations, including the most recent prior study from 03/16/2014, and may be artifactual related to concatenation of overlying vascular structures, however, repeat standing PA and lateral chest x-ray in 1-2 months is recommended to ensure the stability or resolution of this finding.   Electronically Signed   By: Trudie Reed M.D.   On: 03/28/2014 18:38   Dg Lumbar Spine Complete  03/27/2014   CLINICAL DATA:  Fall yesterday landing on back.  Back pain. History of spinal stenosis and degenerative disc disease. Initial encounter.  EXAM: LUMBAR SPINE - COMPLETE 4+ VIEW  COMPARISON:  Lumbar spine radiographs 05/30/2013. Lumbar MRI 12/19/2013.  FINDINGS: There are 5 lumbar type vertebral bodies. The alignment is normal. There is no evidence of acute fracture or paraspinal hematoma. Intervertebral spurring in the lower thoracic and upper lumbar spine and mild lumbar spine facet degenerative changes are stable.  IMPRESSION: No evidence acute lumbar spine injury.   Electronically Signed   By: Roxy Horseman M.D.   On: 03/27/2014 16:48   Dg Sacrum/coccyx  03/27/2014   CLINICAL DATA:  Low back pain.  EXAM: SACRUM AND COCCYX - 2+ VIEW  COMPARISON:  Lumbar MRI, 12/19/2013.  FINDINGS: No fracture. No bone  lesion. Hip joints and SI joints are normally spaced and aligned. Soft tissues are unremarkable.  IMPRESSION: Negative.   Electronically Signed   By: Amie Portland M.D.   On: 03/27/2014 16:50     EKG Interpretation   Date/Time:  Monday March 28 2014 17:39:42 EST Ventricular Rate:  67 PR Interval:  185 QRS Duration: 99 QT Interval:  394 QTC Calculation: 416 R Axis:   13 Text Interpretation:  Sinus rhythm Abnormal R-wave progression, early  transition Borderline T abnormalities, inferior leads Baseline wander in  lead(s) V2 No significant change since last tracing Confirmed by [facility]   MD, MARTHA [phone]) on 03/28/2014 5:43:27 PM      MDM   Final diagnoses:  Atypical chest pain  Anxiety attack  Nausea  Essential hypertension    49 y.o. male with CP which is currently resolved. EKG unchanged from prior. CXR with possible new nodularity in RUF but no acute findings. CBC unremarkable. BMP pending. Trop neg. Will give ASA now but since pt isn't having CP, will hold NTG. I believe this is likely from anxiety. Will give ativan now. Will repeat trop since it's been 3hrs. Doubt DVT/PE, PERC neg, no hypoxia, no tachycardia, or no SOB. Will  reassess shortly.  11:32 PM BMP returning mildly low bicarb, AG 19, gluc 117. Serum and urine ketones neg. U/A clear. Second trop neg. No ongoing CP, feeling well, tolerating PO well. HEART score 2, and without any ongoing symptoms doubt need for admission. Will have him f/up with PCP for recheck of labs and ongoing care. Will refill his clonidine, metoprolol, and give ativan for anxiety and zofran for nausea. I explained the diagnosis and have given explicit precautions to return to the ER including for any other new or worsening symptoms. The patient understands and accepts the medical plan as it's been dictated and I have answered their questions. Discharge instructions concerning home care and prescriptions have been given. The patient is STABLE and is discharged to home in good condition.  BP 158/98 mmHg  Pulse 71  Temp(Src) 98.2 F (36.8 C) (Oral)  Resp 20  SpO2 97%  Meds ordered this encounter  Medications  . aspirin tablet 325 mg    Sig:   . LORazepam (ATIVAN) tablet 1 mg    Sig:   . ondansetron (ZOFRAN-ODT) disintegrating tablet 8 mg    Sig:   . LORazepam (ATIVAN) 1 MG tablet    Sig: Take 1 tablet (1 mg total) by mouth every 6 (six) hours as needed for anxiety.    Dispense:  10 tablet    Refill:  0    Order Specific Question:  Supervising Provider    Answer:  Eber Hong D [[id number]]  . metoprolol (LOPRESSOR) 50 MG tablet    Sig: Take 1 tablet (50 mg total) by mouth 2 (two) times daily.    Dispense:  60 tablet    Refill:  0    Order Specific Question:  Supervising Provider    Answer:  Eber Hong D [[id number]]  . cloNIDine (CATAPRES) 0.2 MG tablet    Sig: Take 1.5 tablets (0.3 mg total) by mouth 2 (two) times daily.    Dispense:  60 tablet    Refill:  0    Order Specific Question:  Supervising Provider    Answer:  Eber Hong D [[id number]]  . ondansetron (ZOFRAN) 8 MG tablet    Sig: Take 1 tablet (8 mg total) by mouth every 8 (eight) hours as needed for  nausea or vomiting.     Dispense:  10 tablet    Refill:  0    Order Specific Question:  Supervising Provider    Answer:  Vida Roller [address] [place], PA-C 03/28/14 1610  Tilden Fossa, MD 03/28/14 [phone]

## 2014-03-28 NOTE — Discharge Instructions (Signed)
Your chest pain is likely due to anxiety from the recent news of a close death in the family. Use ativan as directed as needed for anxiety. Use tylenol/motrin for pain. Use zofran as directed as needed for nausea. Continue your normal home medications. See your regular doctor in 1 week for recheck of symptoms. Return to the ER for changes or worsening symptoms   Chest Pain (Nonspecific) It is often hard to give a specific diagnosis for the cause of chest pain. There is always a chance that your pain could be related to something serious, such as a heart attack or a blood clot in the lungs. You need to follow up with your health care provider for further evaluation. CAUSES   Heartburn.  Pneumonia or bronchitis.  Anxiety or stress.  Inflammation around your heart (pericarditis) or lung (pleuritis or pleurisy).  A blood clot in the lung.  A collapsed lung (pneumothorax). It can develop suddenly on its own (spontaneous pneumothorax) or from trauma to the chest.  Shingles infection (herpes zoster virus). The chest wall is composed of bones, muscles, and cartilage. Any of these can be the source of the pain.  The bones can be bruised by injury.  The muscles or cartilage can be strained by coughing or overwork.  The cartilage can be affected by inflammation and become sore (costochondritis). DIAGNOSIS  Lab tests or other studies may be needed to find the cause of your pain. Your health care provider may have you take a test called an ambulatory electrocardiogram (ECG). An ECG records your heartbeat patterns over a 24-hour period. You may also have other tests, such as:  Transthoracic echocardiogram (TTE). During echocardiography, sound waves are used to evaluate how blood flows through your heart.  Transesophageal echocardiogram (TEE).  Cardiac monitoring. This allows your health care provider to monitor your heart rate and rhythm in real time.  Holter monitor. This is a portable device  that records your heartbeat and can help diagnose heart arrhythmias. It allows your health care provider to track your heart activity for several days, if needed.  Stress tests by exercise or by giving medicine that makes the heart beat faster. TREATMENT   Treatment depends on what may be causing your chest pain. Treatment may include:  Acid blockers for heartburn.  Anti-inflammatory medicine.  Pain medicine for inflammatory conditions.  Antibiotics if an infection is present.  You may be advised to change lifestyle habits. This includes stopping smoking and avoiding alcohol, caffeine, and chocolate.  You may be advised to keep your head raised (elevated) when sleeping. This reduces the chance of acid going backward from your stomach into your esophagus. Most of the time, nonspecific chest pain will improve within 2-3 days with rest and mild pain medicine.  HOME CARE INSTRUCTIONS   If antibiotics were prescribed, take them as directed. Finish them even if you start to feel better.  For the next few days, avoid physical activities that bring on chest pain. Continue physical activities as directed.  Do not use any tobacco products, including cigarettes, chewing tobacco, or electronic cigarettes.  Avoid drinking alcohol.  Only take medicine as directed by your health care provider.  Follow your health care provider's suggestions for further testing if your chest pain does not go away.  Keep any follow-up appointments you made. If you do not go to an appointment, you could develop lasting (chronic) problems with pain. If there is any problem keeping an appointment, call to reschedule. SEEK MEDICAL CARE IF:  Your chest pain does not go away, even after treatment.  You have a rash with blisters on your chest.  You have a fever. SEEK IMMEDIATE MEDICAL CARE IF:   You have increased chest pain or pain that spreads to your arm, neck, jaw, back, or abdomen.  You have shortness of  breath.  You have an increasing cough, or you cough up blood.  You have severe back or abdominal pain.  You feel nauseous or vomit.  You have severe weakness.  You faint.  You have chills. This is an emergency. Do not wait to see if the pain will go away. Get medical help at once. Call your local emergency services (911 in [place] Do not drive yourself to the hospital. MAKE SURE YOU:   Understand these instructions.  Will watch your condition.  Will get help right away if you are not doing well or get worse. Document Released: 11/28/2004 Document Revised: 02/23/2013 Document Reviewed: 09/24/2007 [facility] Patient Information 2015 [place], [place]. This information is not intended to replace advice given to you by your health care provider. Make sure you discuss any questions you have with your health care provider.  Hypertension Hypertension, commonly called high blood pressure, is when the force of blood pumping through your arteries is too strong. Your arteries are the blood vessels that carry blood from your heart throughout your body. A blood pressure reading consists of a higher number over a lower number, such as 110/72. The higher number (systolic) is the pressure inside your arteries when your heart pumps. The lower number (diastolic) is the pressure inside your arteries when your heart relaxes. Ideally you want your blood pressure below 120/80. Hypertension forces your heart to work harder to pump blood. Your arteries may become narrow or stiff. Having hypertension puts you at risk for heart disease, stroke, and other problems.  RISK FACTORS Some risk factors for high blood pressure are controllable. Others are not.  Risk factors you cannot control include:   Race. You may be at higher risk if you are African American.  Age. Risk increases with age.  Gender. Men are at higher risk than women before age 47 years. After age 10465, women are at higher risk than men. Risk factors  you can control include:  Not getting enough exercise or physical activity.  Being overweight.  Getting too much fat, sugar, calories, or salt in your diet.  Drinking too much alcohol. SIGNS AND SYMPTOMS Hypertension does not usually cause signs or symptoms. Extremely high blood pressure (hypertensive crisis) may cause headache, anxiety, shortness of breath, and nosebleed. DIAGNOSIS  To check if you have hypertension, your health care provider will measure your blood pressure while you are seated, with your arm held at the level of your heart. It should be measured at least twice using the same arm. Certain conditions can cause a difference in blood pressure between your right and left arms. A blood pressure reading that is higher than normal on one occasion does not mean that you need treatment. If one blood pressure reading is high, ask your health care provider about having it checked again. TREATMENT  Treating high blood pressure includes making lifestyle changes and possibly taking medicine. Living a healthy lifestyle can help lower high blood pressure. You may need to change some of your habits. Lifestyle changes may include:  Following the DASH diet. This diet is high in fruits, vegetables, and whole grains. It is low in salt, red meat, and added sugars.  Getting at least 2 hours of brisk physical activity every week.  Losing weight if necessary.  Not smoking.  Limiting alcoholic beverages.  Learning ways to reduce stress. If lifestyle changes are not enough to get your blood pressure under control, your health care provider may prescribe medicine. You may need to take more than one. Work closely with your health care provider to understand the risks and benefits. HOME CARE INSTRUCTIONS  Have your blood pressure rechecked as directed by your health care provider.   Take medicines only as directed by your health care provider. Follow the directions carefully. Blood pressure  medicines must be taken as prescribed. The medicine does not work as well when you skip doses. Skipping doses also puts you at risk for problems.   Do not smoke.   Monitor your blood pressure at home as directed by your health care provider. SEEK MEDICAL CARE IF:   You think you are having a reaction to medicines taken.  You have recurrent headaches or feel dizzy.  You have swelling in your ankles.  You have trouble with your vision. SEEK IMMEDIATE MEDICAL CARE IF:  You develop a severe headache or confusion.  You have unusual weakness, numbness, or feel faint.  You have severe chest or abdominal pain.  You vomit repeatedly.  You have trouble breathing. MAKE SURE YOU:   Understand these instructions.  Will watch your condition.  Will get help right away if you are not doing well or get worse. Document Released: 02/18/2005 Document Revised: 07/05/2013 Document Reviewed: 12/11/2012 [facility] Patient Information 2015 [place], [place]. This information is not intended to replace advice given to you by your health care provider. Make sure you discuss any questions you have with your health care provider.  Panic Attacks Panic attacks are sudden, short-livedsurges of severe anxiety, fear, or discomfort. They may occur for no reason when you are relaxed, when you are anxious, or when you are sleeping. Panic attacks may occur for a number of reasons:   Healthy people occasionally have panic attacks in extreme, life-threatening situations, such as war or natural disasters. Normal anxiety is a protective mechanism of the body that helps [place] react to danger (fight or flight response).  Panic attacks are often seen with anxiety disorders, such as panic disorder, social anxiety disorder, generalized anxiety disorder, and phobias. Anxiety disorders cause excessive or uncontrollable anxiety. They may interfere with your relationships or other life activities.  Panic attacks are sometimes  seen with other mental illnesses, such as depression and posttraumatic stress disorder.  Certain medical conditions, prescription medicines, and drugs of abuse can cause panic attacks. SYMPTOMS  Panic attacks start suddenly, peak within 20 minutes, and are accompanied by four or more of the following symptoms:  Pounding heart or fast heart rate (palpitations).  Sweating.  Trembling or shaking.  Shortness of breath or feeling smothered.  Feeling choked.  Chest pain or discomfort.  Nausea or strange feeling in your stomach.  Dizziness, light-headedness, or feeling like you will faint.  Chills or hot flushes.  Numbness or tingling in your lips or hands and feet.  Feeling that things are not real or feeling that you are not yourself.  Fear of losing control or going crazy.  Fear of dying. Some of these symptoms can mimic serious medical conditions. For example, you may think you are having a heart attack. Although panic attacks can be very scary, they are not life threatening. DIAGNOSIS  Panic attacks are diagnosed through an assessment by  your health care provider. Your health care provider will ask questions about your symptoms, such as where and when they occurred. Your health care provider will also ask about your medical history and use of alcohol and drugs, including prescription medicines. Your health care provider may order blood tests or other studies to rule out a serious medical condition. Your health care provider may refer you to a mental health professional for further evaluation. TREATMENT   Most healthy people who have one or two panic attacks in an extreme, life-threatening situation will not require treatment.  The treatment for panic attacks associated with anxiety disorders or other mental illness typically involves counseling with a mental health professional, medicine, or a combination of both. Your health care provider will help determine what treatment is  best for you.  Panic attacks due to physical illness usually go away with treatment of the illness. If prescription medicine is causing panic attacks, talk with your health care provider about stopping the medicine, decreasing the dose, or substituting another medicine.  Panic attacks due to alcohol or drug abuse go away with abstinence. Some adults need professional help in order to stop drinking or using drugs. HOME CARE INSTRUCTIONS   Take all medicines as directed by your health care provider.   Schedule and attend follow-up visits as directed by your health care provider. It is important to keep all your appointments. SEEK MEDICAL CARE IF:  You are not able to take your medicines as prescribed.  Your symptoms do not improve or get worse. SEEK IMMEDIATE MEDICAL CARE IF:   You experience panic attack symptoms that are different than your usual symptoms.  You have serious thoughts about hurting yourself or others.  You are taking medicine for panic attacks and have a serious side effect. MAKE SURE YOU:  Understand these instructions.  Will watch your condition.  Will get help right away if you are not doing well or get worse. Document Released: 02/18/2005 Document Revised: 02/23/2013 Document Reviewed: 10/02/2012 [facility] Patient Information 2015 [place], [place]. This information is not intended to replace advice given to you by your health care provider. Make sure you discuss any questions you have with your health care provider.

## 2014-04-04 ENCOUNTER — Encounter (HOSPITAL_COMMUNITY): Payer: Self-pay

## 2014-04-04 ENCOUNTER — Emergency Department (HOSPITAL_COMMUNITY)
Admission: EM | Admit: 2014-04-04 | Discharge: 2014-04-05 | Disposition: A | Discharge status: Home / Self Care | Payer: Self-pay | Attending: Emergency Medicine | Admitting: Emergency Medicine

## 2014-04-04 DIAGNOSIS — Z720 Tobacco use: Secondary | ICD-10-CM | POA: Insufficient documentation

## 2014-04-04 DIAGNOSIS — Z8739 Personal history of other diseases of the musculoskeletal system and connective tissue: Secondary | ICD-10-CM | POA: Insufficient documentation

## 2014-04-04 DIAGNOSIS — Z79899 Other long term (current) drug therapy: Secondary | ICD-10-CM | POA: Insufficient documentation

## 2014-04-04 DIAGNOSIS — I10 Essential (primary) hypertension: Secondary | ICD-10-CM | POA: Insufficient documentation

## 2014-04-04 DIAGNOSIS — E669 Obesity, unspecified: Secondary | ICD-10-CM | POA: Insufficient documentation

## 2014-04-04 DIAGNOSIS — Z880 Allergy status to penicillin: Secondary | ICD-10-CM | POA: Insufficient documentation

## 2014-04-04 DIAGNOSIS — F419 Anxiety disorder, unspecified: Secondary | ICD-10-CM | POA: Insufficient documentation

## 2014-04-04 DIAGNOSIS — R569 Unspecified convulsions: Secondary | ICD-10-CM | POA: Insufficient documentation

## 2014-04-04 NOTE — ED Notes (Addendum)
Patient reports having two seizures this afternoon.  Denies having seizures before.  He reports they were witnessed.  He reports he hasn't had any of his prescribed medications since am yesterday after his mother threw them away.

## 2014-04-05 ENCOUNTER — Emergency Department (HOSPITAL_COMMUNITY): Payer: Self-pay

## 2014-04-05 LAB — RAPID URINE DRUG SCREEN, HOSP PERFORMED
Amphetamines: NOT DETECTED
BENZODIAZEPINES: POSITIVE — AB
Barbiturates: NOT DETECTED
Cocaine: NOT DETECTED
Opiates: NOT DETECTED
TETRAHYDROCANNABINOL: NOT DETECTED

## 2014-04-05 LAB — URINALYSIS, ROUTINE W REFLEX MICROSCOPIC
Bilirubin Urine: NEGATIVE
GLUCOSE, UA: NEGATIVE mg/dL
Hgb urine dipstick: NEGATIVE
KETONES UR: NEGATIVE mg/dL
LEUKOCYTES UA: NEGATIVE
Nitrite: NEGATIVE
PH: 6 (ref 5.0–8.0)
PROTEIN: NEGATIVE mg/dL
Specific Gravity, Urine: 1.021 (ref 1.005–1.030)
UROBILINOGEN UA: 0.2 mg/dL (ref 0.0–1.0)

## 2014-04-05 LAB — CBC WITH DIFFERENTIAL/PLATELET
BASOS ABS: 0.1 10*3/uL (ref 0.0–0.1)
BASOS PCT: 1 % (ref 0–1)
EOS ABS: 0.2 10*3/uL (ref 0.0–0.7)
Eosinophils Relative: 2 % (ref 0–5)
HEMATOCRIT: 46.2 % (ref 39.0–52.0)
HEMOGLOBIN: 15.2 g/dL (ref 13.0–17.0)
LYMPHS ABS: 3.7 10*3/uL (ref 0.7–4.0)
LYMPHS PCT: 38 % (ref 12–46)
MCH: 28.8 pg (ref 26.0–34.0)
MCHC: 32.9 g/dL (ref 30.0–36.0)
MCV: 87.7 fL (ref 78.0–100.0)
MONO ABS: 1 10*3/uL (ref 0.1–1.0)
MONOS PCT: 11 % (ref 3–12)
Neutro Abs: 4.8 10*3/uL (ref 1.7–7.7)
Neutrophils Relative %: 48 % (ref 43–77)
Platelets: 204 10*3/uL (ref 150–400)
RBC: 5.27 MIL/uL (ref 4.22–5.81)
RDW: 13.3 % (ref 11.5–15.5)
WBC: 9.8 10*3/uL (ref 4.0–10.5)

## 2014-04-05 LAB — COMPREHENSIVE METABOLIC PANEL
ALBUMIN: 4.2 g/dL (ref 3.5–5.2)
ALK PHOS: 84 U/L (ref 39–117)
ALT: 19 U/L (ref 0–53)
AST: 26 U/L (ref 0–37)
Anion gap: 6 (ref 5–15)
BUN: 12 mg/dL (ref 6–23)
CALCIUM: 9.1 mg/dL (ref 8.4–10.5)
CO2: 30 mmol/L (ref 19–32)
CREATININE: 0.99 mg/dL (ref 0.50–1.35)
Chloride: 104 mmol/L (ref 96–112)
GFR calc Af Amer: >90 mL/min (ref >90)
Glucose, Bld: 93 mg/dL (ref 70–99)
Potassium: 3.1 mmol/L — ABNORMAL LOW (ref 3.5–5.1)
Sodium: 140 mmol/L (ref 135–145)
Total Bilirubin: 0.8 mg/dL (ref 0.3–1.2)
Total Protein: 8.1 g/dL (ref 6.0–8.3)

## 2014-04-05 LAB — I-STAT CG4 LACTIC ACID, ED: Lactic Acid, Venous: 1.2 mmol/L (ref 0.5–2.0)

## 2014-04-05 MED ORDER — METOPROLOL TARTRATE 50 MG PO TABS: 50.0000 mg | ORAL_TABLET | Freq: Two times a day (BID) | ORAL | Status: DC

## 2014-04-05 MED ORDER — CLONIDINE HCL 0.3 MG PO TABS: 0.3000 mg | ORAL_TABLET | Freq: Two times a day (BID) | ORAL | Status: DC

## 2014-04-05 MED ORDER — LISINOPRIL 20 MG PO TABS: 20.0000 mg | ORAL_TABLET | Freq: Every morning | ORAL | Status: DC

## 2014-04-05 MED ORDER — ACETAMINOPHEN 500 MG PO TABS
1000.0000 mg | ORAL_TABLET | Freq: Once | ORAL | Status: AC
  Administered 2014-04-05: 1000 mg via ORAL
  Filled 2014-04-05: qty 2

## 2014-04-05 MED ORDER — IBUPROFEN 800 MG PO TABS
800.0000 mg | ORAL_TABLET | Freq: Once | ORAL | Status: DC
  Filled 2014-04-05: qty 1

## 2014-04-05 NOTE — Discharge Instructions (Signed)
Your workup today did not show a specific cause for your seizures.  They may be related to your panic attacks.  It is important for you to follow-up with your mental health provider to get stabilized as far as your anxiety is concerned.  No driving or operating heavy machinery until you have been cleared by neurology.  Please take your blood pressure medications as prescribed.  For pain.  You may take Tylenol.  It is not safe for you to take Ultram as this can lower your susceptibility to further seizures.   Driving and Equipment Restrictions Some medical problems make it dangerous to drive, ride a bike, or use machines. Some of these problems are:  A hard blow to the head (concussion).  Passing out (fainting).  Twitching and shaking (seizures).  Low blood sugar.  Taking medicine to help you relax (sedatives).  Taking pain medicines.  Wearing an eye patch.  Wearing splints. This can make it hard to use parts of your body that you need to drive safely. HOME CARE   Do not drive until your doctor says it is okay.  Do not use machines until your doctor says it is okay. You may need a form signed by your doctor (medical release) before you can drive again. You may also need this form before you do other tasks where you need to be fully alert. MAKE SURE YOU:  Understand these instructions.  Will watch your condition.  Will get help right away if you are not doing well or get worse. Document Released: 03/28/2004 Document Revised: 05/13/2011 Document Reviewed: 06/28/2009 [facility] Patient Information 2015 [place], [place]. This information is not intended to replace advice given to you by your health care provider. Make sure you discuss any questions you have with your health care provider.  Seizure, Adult A seizure is abnormal electrical activity in the brain. Seizures usually last from 30 seconds to 2 minutes. There are various types of seizures. Before a seizure, you may have a  warning sensation (aura) that a seizure is about to occur. An aura may include the following symptoms:   Fear or anxiety.  Nausea.  Feeling like the room is spinning (vertigo).  Vision changes, such as seeing flashing lights or spots. Common symptoms during a seizure include:  A change in attention or behavior (altered mental status).  Convulsions with rhythmic jerking movements.  Drooling.  Rapid eye movements.  Grunting.  Loss of bladder and bowel control.  Bitter taste in the mouth.  Tongue biting. After a seizure, you may feel confused and sleepy. You may also have an injury resulting from convulsions during the seizure. HOME CARE INSTRUCTIONS   If you are given medicines, take them exactly as prescribed by your health care provider.  Keep all follow-up appointments as directed by your health care provider.  Do not swim or drive or engage in risky activity during which a seizure could cause further injury to you or others until your health care provider says it is OK.  Get adequate rest.  Teach friends and family what to do if you have a seizure. They should:  Lay you on the ground to prevent a fall.  Put a cushion under your head.  Loosen any tight clothing around your neck.  Turn you on your side. If vomiting occurs, this helps keep your airway clear.  Stay with you until you recover.  Know whether or not you need emergency care. SEEK IMMEDIATE MEDICAL CARE IF:  The seizure lasts  longer than 5 minutes.  The seizure is severe or you do not wake up immediately after the seizure.  You have an altered mental status after the seizure.  You are having more frequent or worsening seizures. Someone should drive you to the emergency department or call local emergency services (911 in [place] MAKE SURE YOU:  Understand these instructions.  Will watch your condition.  Will get help right away if you are not doing well or get worse. Document Released:  02/16/2000 Document Revised: 12/09/2012 Document Reviewed: 09/30/2012 [facility] Patient Information 2015 [place], [place]. This information is not intended to replace advice given to you by your health care provider. Make sure you discuss any questions you have with your health care provider.

## 2014-04-05 NOTE — ED Provider Notes (Signed)
CSN: [id number]     Arrival date & time 04/04/14  2124 History   First MD Initiated Contact with Patient 04/04/14 2311     Chief Complaint  Patient presents with  . Seizures     (Consider location/radiation/quality/duration/timing/severity/associated sxs/prior Treatment) HPI 49 year old male presents to emergency department with complaint 2 seizures during the day today.  1.  This morning, one this afternoon.  Seizures were witnessed by family members.  Patient does not remember the events of the seizures.  He reports prior to the seizures.  He remembers being anxious and having a panic attack leading into his seizures.  He reports his father had panic attacks that cause seizures.  Patient has been under a lot of stress recently as his sister died last week.  Patient without urination, defecation during the seizures.  It is unclear how long it lasted.  Patient also reports that his mother threw away his blood pressure medications.  He reports that she has mild dementia and is a TEFL teacher Witness and does not believe in medications.  He reports that he aches all over from his seizures.  Patient recently seen by [facility], has started the end take process.  He has not yet been seen to get started on medications.  He reports that he was given Ativan here in the emergency department recently, but his run out.  Patient reports history of degenerative disc disease and spinal stenosis. Past Medical History  Diagnosis Date  . Hypertension   . Anxiety   . Tachycardia - pulse   . Degenerative disk disease   . Spinal stenosis   . Degenerative disk disease   . Tobacco abuse   . Obesity   . Polysubstance abuse   . Chest pain     Hospital, March, 2014, negative enzymes, patient refused in-hospital  stress test, patient canceled outpatient stress test  . Spinal stenosis    History reviewed. No pertinent past surgical history. Family History  Problem Relation Age of Onset  . Stroke Mother   .  Hypertension Mother   . Stroke Father   . Hypertension Father   . Dementia Father   . Diabetes Father   . Heart disease Father   . Cancer Sister     brain  . Heart disease Brother    History  Substance Use Topics  . Smoking status: Current Every Day Smoker -- 0.02 packs/day    Types: Cigarettes  . Smokeless tobacco: Never Used  . Alcohol Use: No    Review of Systems    Allergies  Blueberry flavor; Penicillins; Robaxin; Flexeril; and Toradol  Home Medications   Prior to Admission medications   Medication Sig Start Date End Date Taking? Authorizing Provider  cloNIDine (CATAPRES) 0.3 MG tablet Take 0.3 mg by mouth 2 (two) times daily.   Yes Historical Provider, MD  ibuprofen (ADVIL,MOTRIN) 200 MG tablet Take 800 mg by mouth every 6 (six) hours as needed for headache or moderate pain (pain).    Yes Historical Provider, MD  lisinopril (PRINIVIL,ZESTRIL) 20 MG tablet Take 1 tablet (20 mg total) by mouth every morning. For high blood pressure Patient taking differently: Take 20 mg by mouth daily. For high blood pressure 10/12/13  Yes Olugbemiga E Hyman Hopes, MD  metoprolol (LOPRESSOR) 50 MG tablet Take 1 tablet (50 mg total) by mouth 2 (two) times daily. For high blood pressure 01/03/14  Yes Olugbemiga E Hyman Hopes, MD  ranitidine (ZANTAC) 75 MG tablet Take 75 mg by mouth daily as needed  for heartburn (heartburn).   Yes Historical Provider, MD  traMADol (ULTRAM) 50 MG tablet Take 1 tablet (50 mg total) by mouth every 6 (six) hours as needed. Patient taking differently: Take 50 mg by mouth every 6 (six) hours as needed for moderate pain.  03/27/14  Yes Heather Laisure, PA-C  cloNIDine (CATAPRES) 0.2 MG tablet Take 1.5 tablets (0.3 mg total) by mouth 2 (two) times daily. Patient not taking: Reported on 04/04/2014 03/16/14   Earle Gell Cartner, PA-C  cloNIDine (CATAPRES) 0.2 MG tablet Take 1.5 tablets (0.3 mg total) by mouth 2 (two) times daily. Patient not taking: Reported on 04/04/2014 03/28/14    Donnita Falls Camprubi-Soms, PA-C  diazepam (VALIUM) 10 MG tablet Take 1 tablet (10 mg total) by mouth every 8 (eight) hours as needed (Muscle spasm). Patient not taking: Reported on 04/04/2014 02/27/14   Flint Melter, MD  diphenhydrAMINE (BENADRYL) 25 MG tablet Take 25 mg by mouth every 6 (six) hours as needed for allergies (allergies).    Historical Provider, MD  LORazepam (ATIVAN) 1 MG tablet Take 1 tablet (1 mg total) by mouth every 6 (six) hours as needed for anxiety. Patient not taking: Reported on 04/04/2014 03/28/14   Donnita Falls Camprubi-Soms, PA-C  metoprolol (LOPRESSOR) 50 MG tablet Take 1 tablet (50 mg total) by mouth 2 (two) times daily. Patient not taking: Reported on 04/04/2014 03/28/14   Donnita Falls Camprubi-Soms, PA-C  ondansetron (ZOFRAN) 8 MG tablet Take 1 tablet (8 mg total) by mouth every 8 (eight) hours as needed for nausea or vomiting. 03/28/14   Donnita Falls Camprubi-Soms, PA-C   BP 156/112 mmHg  Pulse 72  Temp(Src) 98 F (36.7 C) (Oral)  Resp 20  Ht  (1.803 m)  Wt 290 lb (131.543 kg)  BMI 40.46 kg/m2  SpO2 100% Physical Exam  Constitutional: He is oriented to person, place, and time. He appears well-developed and well-nourished.  HENT:  Head: Normocephalic and atraumatic.  Right Ear: External ear normal.  Left Ear: External ear normal.  Nose: Nose normal.  Mouth/Throat: Oropharynx is clear and moist.  Eyes: Conjunctivae and EOM are normal. Pupils are equal, round, and reactive to light.  Neck: Normal range of motion. Neck supple. No JVD present. No tracheal deviation present. No thyromegaly present.  Cardiovascular: Normal rate, regular rhythm, normal heart sounds and intact distal pulses.  Exam reveals no gallop and no friction rub.   No murmur heard. Pulmonary/Chest: Effort normal and breath sounds normal. No stridor. No respiratory distress. He has no wheezes. He has no rales. He exhibits no tenderness.  Abdominal: Soft. Bowel sounds are  normal. He exhibits no distension and no mass. There is no tenderness. There is no rebound and no guarding.  Musculoskeletal: Normal range of motion. He exhibits no edema or tenderness.  Lymphadenopathy:    He has no cervical adenopathy.  Neurological: He is alert and oriented to person, place, and time. He displays normal reflexes. No cranial nerve deficit. He exhibits normal muscle tone. Coordination normal.  Skin: Skin is warm and dry. No rash noted. No erythema. No pallor.  Psychiatric: He has a normal mood and affect. His behavior is normal. Judgment and thought content normal.  Nursing note and vitals reviewed.   ED Course  Procedures (including critical care time) Labs Review Labs Reviewed  COMPREHENSIVE METABOLIC PANEL - Abnormal; Notable for the following:    Potassium 3.1 (*)    All other components within normal limits  URINE RAPID DRUG SCREEN (HOSP  PERFORMED) - Abnormal; Notable for the following:    Benzodiazepines POSITIVE (*)    All other components within normal limits  CBC WITH DIFFERENTIAL/PLATELET  URINALYSIS, ROUTINE W REFLEX MICROSCOPIC  I-STAT CG4 LACTIC ACID, ED    Imaging Review Ct Head Wo Contrast  04/05/2014   CLINICAL DATA:  New onset seizure, 2 seizures this afternoon.  EXAM: CT HEAD WITHOUT CONTRAST  TECHNIQUE: Contiguous axial images were obtained from the base of the skull through the vertex without intravenous contrast.  COMPARISON:  Head CT 12/16/2013  FINDINGS: No intracranial hemorrhage, mass effect, or midline shift. No hydrocephalus. The basilar cisterns are patent. No evidence of territorial infarct. No intracranial fluid collection. Calvarium is intact. Included paranasal sinuses and mastoid air cells are well aerated.  IMPRESSION: No acute intracranial abnormality.   Electronically Signed   By: Rubye OaksMelanie  Ehinger M.D.   On: 04/05/2014 02:25     EKG Interpretation None      MDM   Final diagnoses:  New onset seizure    49 year old male with  new onset seizure.  They appear to by his description to be triggered by anxiety, diagnosis includes new onset seizures, pseudoseizures/not out.  Lactic seizures.  He is back to baseline at this time.  Labs are unremarkable.  Plan for head CT given age at new onset seizures.  He has a primary care doctor, as well as a mental health provider.  We'll also refer him to neurology.  He will be instructed that he is unable to drive until he has been seizure-free for 6 months or cleared by neurology.    Olivia Mackielga M Nochum Fenter, MD 04/05/14 [phone]

## 2014-04-07 ENCOUNTER — Ambulatory Visit: Payer: Self-pay | Attending: Internal Medicine | Admitting: Internal Medicine

## 2014-04-07 ENCOUNTER — Encounter: Payer: Self-pay | Admitting: Internal Medicine

## 2014-04-07 VITALS — BP 119/84 | HR 72 | Temp 98.6°F | Ht 71.5 in | Wt 293.4 lb

## 2014-04-07 DIAGNOSIS — I10 Essential (primary) hypertension: Secondary | ICD-10-CM | POA: Insufficient documentation

## 2014-04-07 DIAGNOSIS — K921 Melena: Secondary | ICD-10-CM | POA: Insufficient documentation

## 2014-04-07 DIAGNOSIS — F445 Conversion disorder with seizures or convulsions: Secondary | ICD-10-CM | POA: Insufficient documentation

## 2014-04-07 DIAGNOSIS — G894 Chronic pain syndrome: Secondary | ICD-10-CM | POA: Insufficient documentation

## 2014-04-07 MED ORDER — CLONIDINE HCL 0.3 MG PO TABS: 0.3000 mg | ORAL_TABLET | Freq: Two times a day (BID) | ORAL | Status: DC

## 2014-04-07 MED ORDER — METOPROLOL TARTRATE 50 MG PO TABS: 50.0000 mg | ORAL_TABLET | Freq: Two times a day (BID) | ORAL | Status: DC

## 2014-04-07 MED ORDER — TRAMADOL HCL 50 MG PO TABS: 50.0000 mg | ORAL_TABLET | Freq: Two times a day (BID) | ORAL | Status: DC | PRN

## 2014-04-07 MED ORDER — RANITIDINE HCL 75 MG PO TABS: 75.0000 mg | ORAL_TABLET | Freq: Every day | ORAL | Status: DC | PRN

## 2014-04-07 MED ORDER — LISINOPRIL 20 MG PO TABS: 20.0000 mg | ORAL_TABLET | Freq: Every morning | ORAL | Status: DC

## 2014-04-07 NOTE — Progress Notes (Signed)
Patient ID: Travis [name] Vest, male   DOB: 07-12-1965, 49 y.o.   MRN: [id number]   Travis [name] Heavin, is a 49 y.o. male  [id number]  [id number]  DOB - 07-12-1965  Chief Complaint  Patient presents with  . Follow-up        Subjective:   Travis [name] Travis [name] is a 49 y.o. male here today for a follow up visit. PMH significant for HTN, GERD, hypercholesterolemia, depression, panic attacks, chronic pain, and spinal stenosis that is unchanged from July 2014 based on most recent MRI results Patient states he has had 2 seizures on 04-03-14. He states one of these was [facility]. Patient states his sister recently passed away and he feels this contributed to the seizures. All workups were negative, his seizure was believed to be from anxiety and not true seizures. Pt also c/o having a sore on the right side of his face x 2 weeks. Patient is here for follow-up of ED visits. He has no new complaints. He needs a refill of his blood pressure medications, patient claims his mother threw away all his medications because she does not believe in them as a result of being a Jehovah witness and also has mild dementia. Patient claims he needs a refill of his tramadol and he promises never to abuse them. When counseled that tramadol can cause or reduce seizure threshold, he said "I don't think I have seizure I think it's just my anxiety". Patient has No headache, No chest pain, No abdominal pain - No Nausea, No new weakness tingling or numbness, No Cough - SOB.  Problem  Hematochezia  Psychiatric Pseudoseizure    ALLERGIES: Allergies  Allergen Reactions  . Blueberry Flavor Hives  . Penicillins Hives, Itching and Other (See Comments)    Hallucinations.  . Robaxin [Methocarbamol] Palpitations  . Flexeril [Cyclobenzaprine] Hives  . Toradol [Ketorolac Tromethamine] Hives and Other (See Comments)    Headache    PAST MEDICAL HISTORY: Past Medical History  Diagnosis Date  .  Hypertension   . Anxiety   . Tachycardia - pulse   . Degenerative disk disease   . Spinal stenosis   . Degenerative disk disease   . Tobacco abuse   . Obesity   . Polysubstance abuse   . Chest pain     Hospital, March, 2014, negative enzymes, patient refused in-hospital  stress test, patient canceled outpatient stress test  . Spinal stenosis     MEDICATIONS AT HOME: Prior to Admission medications   Medication Sig Start Date End Date Taking? Authorizing Provider  cloNIDine (CATAPRES) 0.3 MG tablet Take 1 tablet (0.3 mg total) by mouth 2 (two) times daily. 04/07/14  Yes Quentin Angstlugbemiga E Eddie Payette, MD  diphenhydrAMINE (BENADRYL) 25 MG tablet Take 25 mg by mouth every 6 (six) hours as needed for allergies (allergies).   Yes Historical Provider, MD  ibuprofen (ADVIL,MOTRIN) 200 MG tablet Take 800 mg by mouth every 6 (six) hours as needed for headache or moderate pain (pain).    Yes Historical Provider, MD  lisinopril (PRINIVIL,ZESTRIL) 20 MG tablet Take 1 tablet (20 mg total) by mouth every morning. For high blood pressure 04/07/14  Yes Tyge Somers E Hyman HopesJegede, MD  metoprolol (LOPRESSOR) 50 MG tablet Take 1 tablet (50 mg total) by mouth 2 (two) times daily. For high blood pressure 04/07/14  Yes Shaya Altamura E Hyman HopesJegede, MD  ondansetron (ZOFRAN) 8 MG tablet Take 1 tablet (8 mg total) by mouth every 8 (eight) hours as needed for nausea  or vomiting. 03/28/14  Yes Mercedes Strupp Camprubi-Soms, PA-C  ranitidine (ZANTAC) 75 MG tablet Take 1 tablet (75 mg total) by mouth daily as needed for heartburn (heartburn). 04/07/14  Yes Quentin Angst, MD  traMADol (ULTRAM) 50 MG tablet Take 1 tablet (50 mg total) by mouth every 12 (twelve) hours as needed. 04/07/14   Quentin Angst, MD     Objective:   Filed Vitals:   04/07/14 1122  BP: 119/84  Pulse: 72  Temp: 98.6 F (37 C)  TempSrc: Oral  Height: 5' 11.5" (1.816 m)  Weight: 293 lb 6.4 oz (133.085 kg)  SpO2: 97%    Exam General appearance : Awake, alert, not  in any distress. Speech Clear. Not toxic looking HEENT: Atraumatic and Normocephalic, pupils equally reactive to light and accomodation Neck: supple, no JVD. No cervical lymphadenopathy.  Chest:Good air entry bilaterally, no added sounds  CVS: S1 S2 regular, no murmurs.  Abdomen: Bowel sounds present, Non tender and not distended with no gaurding, rigidity or rebound. Extremities: B/L Lower Ext shows no edema, both legs are warm to touch Neurology: Awake alert, and oriented X 3, CN II-XII intact, Non focal Skin:No Rash Wounds:N/A  Data Review Lab Results  Component Value Date   HGBA1C 5.9 10/12/2013   HGBA1C 5.9* 05/29/2012     Assessment & Plan   1. Essential hypertension  Refill - metoprolol (LOPRESSOR) 50 MG tablet; Take 1 tablet (50 mg total) by mouth 2 (two) times daily. For high blood pressure  Dispense: 180 tablet; Refill: 3 - lisinopril (PRINIVIL,ZESTRIL) 20 MG tablet; Take 1 tablet (20 mg total) by mouth every morning. For high blood pressure  Dispense: 90 tablet; Refill: 3 - cloNIDine (CATAPRES) 0.3 MG tablet; Take 1 tablet (0.3 mg total) by mouth 2 (two) times daily.  Dispense: 180 tablet; Refill: 3 - DASH diet  2. Chronic pain syndrome  - traMADol (ULTRAM) 50 MG tablet; Take 1 tablet (50 mg total) by mouth every 12 (twelve) hours as needed.  Dispense: 60 tablet; Refill: 0  3. Hematochezia  - HM COLONOSCOPY - Ambulatory referral to Gastroenterology  4. Psychiatric pseudoseizure  - Ambulatory referral to Neurology   Patient was counseled extensively about nutrition and exercise.   Return in about 3 months (around 07/06/2014), or if symptoms worsen or fail to improve, for Follow up Pain and comorbidities, Generalized Anxiety Disorder.  The patient was given clear instructions to go to ER or return to medical center if symptoms don't improve, worsen or new problems develop. The patient verbalized understanding. The patient was told to call to get lab results if  they haven't heard anything in the next week.   This note has been created with Education officer, environmental. Any transcriptional errors are unintentional.    Jeanann Lewandowsky, MD, MHA, FACP, FAAP [facility] [place], [name] [phone]   04/07/2014, 12:06 PM

## 2014-04-07 NOTE — Patient Instructions (Signed)
Generalized Anxiety Disorder Generalized anxiety disorder (GAD) is a mental disorder. It interferes with life functions, including relationships, work, and school. GAD is different from normal anxiety, which everyone experiences at some point in their lives in response to specific life events and activities. Normal anxiety actually helps us prepare for and get through these life events and activities. Normal anxiety goes away after the event or activity is over.  GAD causes anxiety that is not necessarily related to specific events or activities. It also causes excess anxiety in proportion to specific events or activities. The anxiety associated with GAD is also difficult to control. GAD can vary from mild to severe. People with severe GAD can have intense waves of anxiety with physical symptoms (panic attacks).  SYMPTOMS The anxiety and worry associated with GAD are difficult to control. This anxiety and worry are related to many life events and activities and also occur more days than not for 6 months or longer. People with GAD also have three or more of the following symptoms (one or more in children):  Restlessness.   Fatigue.  Difficulty concentrating.   Irritability.  Muscle tension.  Difficulty sleeping or unsatisfying sleep. DIAGNOSIS GAD is diagnosed through an assessment by your health care provider. Your health care provider will ask you questions aboutyour mood,physical symptoms, and events in your life. Your health care provider may ask you about your medical history and use of alcohol or drugs, including prescription medicines. Your health care provider may also do a physical exam and blood tests. Certain medical conditions and the use of certain substances can cause symptoms similar to those associated with GAD. Your health care provider may refer you to a mental health specialist for further evaluation. TREATMENT The following therapies are usually used to treat GAD:    Medication. Antidepressant medication usually is prescribed for long-term daily control. Antianxiety medicines may be added in severe cases, especially when panic attacks occur.   Talk therapy (psychotherapy). Certain types of talk therapy can be helpful in treating GAD by providing support, education, and guidance. A form of talk therapy called cognitive behavioral therapy can teach you healthy ways to think about and react to daily life events and activities.  Stress managementtechniques. These include yoga, meditation, and exercise and can be very helpful when they are practiced regularly. A mental health specialist can help determine which treatment is best for you. Some people see improvement with one therapy. However, other people require a combination of therapies. Document Released: 06/15/2012 Document Revised: 07/05/2013 Document Reviewed: 06/15/2012 ExitCare Patient Information 2015 [employer]. This information is not intended to replace advice given to you by your health care provider. Make sure you discuss any questions you have with your health care provider.  

## 2014-04-07 NOTE — Progress Notes (Signed)
Patient states he has had 2 seizures on 04-03-14. He states one of these was [facility]. Patient states his sister recently passed away and he feels this contributed to the seizures.  Pt also c/o having a sore on the right side of his face x 2 weeks.

## 2014-04-17 ENCOUNTER — Encounter (HOSPITAL_COMMUNITY): Payer: Self-pay | Admitting: Physical Medicine and Rehabilitation

## 2014-04-17 ENCOUNTER — Emergency Department (HOSPITAL_COMMUNITY)
Admission: EM | Admit: 2014-04-17 | Discharge: 2014-04-17 | Disposition: A | Discharge status: Home / Self Care | Payer: No Typology Code available for payment source | Attending: Emergency Medicine | Admitting: Emergency Medicine

## 2014-04-17 DIAGNOSIS — Z79899 Other long term (current) drug therapy: Secondary | ICD-10-CM | POA: Insufficient documentation

## 2014-04-17 DIAGNOSIS — M545 Low back pain, unspecified: Secondary | ICD-10-CM

## 2014-04-17 DIAGNOSIS — G894 Chronic pain syndrome: Secondary | ICD-10-CM

## 2014-04-17 DIAGNOSIS — I10 Essential (primary) hypertension: Secondary | ICD-10-CM | POA: Insufficient documentation

## 2014-04-17 DIAGNOSIS — F419 Anxiety disorder, unspecified: Secondary | ICD-10-CM | POA: Insufficient documentation

## 2014-04-17 DIAGNOSIS — Z720 Tobacco use: Secondary | ICD-10-CM | POA: Insufficient documentation

## 2014-04-17 DIAGNOSIS — Z880 Allergy status to penicillin: Secondary | ICD-10-CM | POA: Insufficient documentation

## 2014-04-17 DIAGNOSIS — E669 Obesity, unspecified: Secondary | ICD-10-CM | POA: Insufficient documentation

## 2014-04-17 MED ORDER — TRAMADOL HCL 50 MG PO TABS
50.0000 mg | ORAL_TABLET | Freq: Once | ORAL | Status: AC
  Administered 2014-04-17: 50 mg via ORAL
  Filled 2014-04-17: qty 1

## 2014-04-17 MED ORDER — HYDROMORPHONE HCL 1 MG/ML IJ SOLN: 2.0000 mg | Freq: Once | INTRAMUSCULAR | Status: DC

## 2014-04-17 MED ORDER — TRAMADOL HCL 50 MG PO TABS: 50.0000 mg | ORAL_TABLET | Freq: Two times a day (BID) | ORAL | Status: DC | PRN

## 2014-04-17 NOTE — ED Provider Notes (Signed)
CSN: [id number]     Arrival date & time 04/17/14  1119 History  This chart was scribed for non-physician practitioner, Elson Areas, PA-C,  working with Linwood Dibbles, MD, by Lionel December, ED Scribe. This patient was seen in room TR05C/TR05C and the patient's care was started at 12:21 PM.    Chief Complaint  Patient presents with  . Back Pain     (Consider location/radiation/quality/duration/timing/severity/associated sxs/prior Treatment) Patient is a 49 y.o. male presenting with back pain. The history is provided by the patient. No language interpreter was used.  Back Pain Associated symptoms: no fever     HPI Comments: Travis [name] is a 49 y.o. male who presents to the Emergency Department complaining of constant severe back pain onset yesterday.  Patient notes he has been laying on his side due to extreme pain and is unable to sleep.Notes that muscle relaxers do not work well for him. Patient goes to [facility] for his back pain and was put on the waiting list for an earlier appointment.  Patient has spinal stenosis, arthritis, and degenerative disc disease.  He is currently on Clonidine, Lisinopril, Tramadol and Metoprolol.   Patient notes he had a seizure last month and has not had severe panic attacks since then.     Past Medical History  Diagnosis Date  . Hypertension   . Anxiety   . Tachycardia - pulse   . Degenerative disk disease   . Spinal stenosis   . Degenerative disk disease   . Tobacco abuse   . Obesity   . Polysubstance abuse   . Chest pain     Hospital, March, 2014, negative enzymes, patient refused in-hospital  stress test, patient canceled outpatient stress test  . Spinal stenosis    History reviewed. No pertinent past surgical history. Family History  Problem Relation Age of Onset  . Stroke Mother   . Hypertension Mother   . Stroke Father   . Hypertension Father   . Dementia Father   . Diabetes Father   . Heart disease Father    . Cancer Sister     brain  . Heart disease Brother    History  Substance Use Topics  . Smoking status: Current Every Day Smoker -- 0.02 packs/day    Types: Cigarettes  . Smokeless tobacco: Never Used  . Alcohol Use: No    Review of Systems  Constitutional: Negative for fever and chills.  Respiratory: Negative for chest tightness and shortness of breath.   Musculoskeletal: Positive for back pain.      Allergies  Blueberry flavor; Penicillins; Robaxin; Flexeril; and Toradol  Home Medications   Prior to Admission medications   Medication Sig Start Date End Date Taking? Authorizing Provider  cloNIDine (CATAPRES) 0.3 MG tablet Take 1 tablet (0.3 mg total) by mouth 2 (two) times daily. 04/07/14   Quentin Angst, MD  diphenhydrAMINE (BENADRYL) 25 MG tablet Take 25 mg by mouth every 6 (six) hours as needed for allergies (allergies).    Historical Provider, MD  ibuprofen (ADVIL,MOTRIN) 200 MG tablet Take 800 mg by mouth every 6 (six) hours as needed for headache or moderate pain (pain).     Historical Provider, MD  lisinopril (PRINIVIL,ZESTRIL) 20 MG tablet Take 1 tablet (20 mg total) by mouth every morning. For high blood pressure 04/07/14   Quentin Angst, MD  metoprolol (LOPRESSOR) 50 MG tablet Take 1 tablet (50 mg total) by mouth 2 (two) times daily. For high blood  pressure 04/07/14   Quentin Angstlugbemiga E Jegede, MD  ondansetron (ZOFRAN) 8 MG tablet Take 1 tablet (8 mg total) by mouth every 8 (eight) hours as needed for nausea or vomiting. 03/28/14   Donnita FallsMercedes Strupp Camprubi-Soms, PA-C  ranitidine (ZANTAC) 75 MG tablet Take 1 tablet (75 mg total) by mouth daily as needed for heartburn (heartburn). 04/07/14   Quentin Angstlugbemiga E Jegede, MD  traMADol (ULTRAM) 50 MG tablet Take 1 tablet (50 mg total) by mouth every 12 (twelve) hours as needed. 04/07/14   Quentin Angstlugbemiga E Jegede, MD   BP 162/96 mmHg  Pulse 80  Temp(Src) 98.5 F (36.9 C) (Oral)  Resp 18  SpO2 98% Physical Exam  Constitutional: He is  oriented to person, place, and time. He appears well-developed and well-nourished. No distress.  HENT:  Head: Normocephalic and atraumatic.  Eyes: Conjunctivae and EOM are normal.  Neck: Neck supple.  Cardiovascular: Normal rate.   Pulmonary/Chest: Effort normal. No respiratory distress.  Musculoskeletal:  Diffusely tender lumbar spine.   Neurological: He is alert and oriented to person, place, and time.  Skin: Skin is warm and dry.  Psychiatric: He has a normal mood and affect. His behavior is normal.  Nursing note and vitals reviewed.   ED Course  Procedures (including critical care time) DIAGNOSTIC STUDIES: Oxygen Saturation is 98% on RA, normal by my interpretation.    COORDINATION OF CARE: 12:29 PM Discussed treatment plan with patient at beside, the patient agrees with the plan and has no further questions at this time.  Labs Review Labs Reviewed - No data to display  Imaging Review No results found.   EKG Interpretation None      MDM  Pt is out of ultram.  [facility].  Pt referred    Final diagnoses:  Midline low back pain without sciatica   Pt given rx for ultram    Elson AreasLeslie K Sofia, PA-C 04/17/14 1630  Linwood DibblesJon Knapp, MD 04/19/14 [phone]

## 2014-04-17 NOTE — ED Notes (Signed)
Pt presents to department for evaluation of back pain. States chronic issues with same. 9/10 back "spasms" upon arrival to ED. Pt is alert and oriented x4. NAD.

## 2014-04-17 NOTE — Discharge Instructions (Signed)
Back Pain, Adult Low back pain is very common. About 1 in 5 people have back pain.The cause of low back pain is rarely dangerous. The pain often gets better over time.About half of people with a sudden onset of back pain feel better in just 2 weeks. About 8 in 10 people feel better by 6 weeks.  CAUSES Some common causes of back pain include:  Strain of the muscles or ligaments supporting the spine.  Wear and tear (degeneration) of the spinal discs.  Arthritis.  Direct injury to the back. DIAGNOSIS Most of the time, the direct cause of low back pain is not known.However, back pain can be treated effectively even when the exact cause of the pain is unknown.Answering your caregiver's questions about your overall health and symptoms is one of the most accurate ways to make sure the cause of your pain is not dangerous. If your caregiver needs more information, he or she may order lab work or imaging tests (X-rays or MRIs).However, even if imaging tests show changes in your back, this usually does not require surgery. HOME CARE INSTRUCTIONS For many people, back pain returns.Since low back pain is rarely dangerous, it is often a condition that people can learn to manageon their own.   Remain active. It is stressful on the back to sit or stand in one place. Do not sit, drive, or stand in one place for more than 30 minutes at a time. Take short walks on level surfaces as soon as pain allows.Try to increase the length of time you walk each day.  Do not stay in bed.Resting more than 1 or 2 days can delay your recovery.  Do not avoid exercise or work.Your body is made to move.It is not dangerous to be active, even though your back may hurt.Your back will likely heal faster if you return to being active before your pain is gone.  Pay attention to your body when you bend and lift. Many people have less discomfortwhen lifting if they bend their knees, keep the load close to their bodies,and  avoid twisting. Often, the most comfortable positions are those that put less stress on your recovering back.  Find a comfortable position to sleep. Use a firm mattress and lie on your side with your knees slightly bent. If you lie on your back, put a pillow under your knees.  Only take over-the-counter or prescription medicines as directed by your caregiver. Over-the-counter medicines to reduce pain and inflammation are often the most helpful.Your caregiver may prescribe muscle relaxant drugs.These medicines help dull your pain so you can more quickly return to your normal activities and healthy exercise.  Put ice on the injured area.  Put ice in a plastic bag.  Place a towel between your skin and the bag.  Leave the ice on for 15-20 minutes, 03-04 times a day for the first 2 to 3 days. After that, ice and heat may be alternated to reduce pain and spasms.  Ask your caregiver about trying back exercises and gentle massage. This may be of some benefit.  Avoid feeling anxious or stressed.Stress increases muscle tension and can worsen back pain.It is important to recognize when you are anxious or stressed and learn ways to manage it.Exercise is a great option. SEEK MEDICAL CARE IF:  You have pain that is not relieved with rest or medicine.  You have pain that does not improve in 1 week.  You have new symptoms.  You are generally not feeling well. SEEK   IMMEDIATE MEDICAL CARE IF:   You have pain that radiates from your back into your legs.  You develop new bowel or bladder control problems.  You have unusual weakness or numbness in your arms or legs.  You develop nausea or vomiting.  You develop abdominal pain.  You feel faint. Document Released: 02/18/2005 Document Revised: 08/20/2011 Document Reviewed: 06/22/2013 ExitCare Patient Information 2015 [employer]. This information is not intended to replace advice given to you by your health care provider. Make sure you  discuss any questions you have with your health care provider.  

## 2014-04-17 NOTE — ED Notes (Signed)
Declined W/C at D/C and was escorted to lobby by RN. 

## 2014-04-20 ENCOUNTER — Ambulatory Visit (INDEPENDENT_AMBULATORY_CARE_PROVIDER_SITE_OTHER): Payer: Self-pay | Admitting: Family Medicine

## 2014-04-20 VITALS — BP 144/88 | HR 77 | Temp 97.7°F | Resp 16 | Ht 71.0 in | Wt 285.0 lb

## 2014-04-20 DIAGNOSIS — G894 Chronic pain syndrome: Secondary | ICD-10-CM

## 2014-04-20 DIAGNOSIS — I10 Essential (primary) hypertension: Secondary | ICD-10-CM

## 2014-04-20 MED ORDER — LISINOPRIL 20 MG PO TABS: 20.0000 mg | ORAL_TABLET | Freq: Every morning | ORAL | Status: DC

## 2014-04-20 MED ORDER — TRAMADOL HCL 50 MG PO TABS: 100.0000 mg | ORAL_TABLET | Freq: Four times a day (QID) | ORAL | Status: DC | PRN

## 2014-04-20 MED ORDER — CLONIDINE HCL 0.3 MG PO TABS: 0.3000 mg | ORAL_TABLET | Freq: Two times a day (BID) | ORAL | Status: DC

## 2014-04-20 NOTE — Patient Instructions (Addendum)
You were just prescribed a year's supply of your blood pressure medications (lisinopril 20mg , clonidine 0.3mg , metoprolol 50mg ) 2 weeks ago by your Dr. At [facility]  We do not prescribe chronic pain medication here - we usually refer patient's to pain management for this.   There is an increased risk of seizures with tramadol - do not use more than prescribe.  You need a biopsy of the mole on your face - I am concerned that this could be skin cancer.  [facility] Policy for Prescribing Controlled Substances (Revised 01/2012) 1. Prescriptions for controlled substances will be filled by ONE provider at [facility] with whom you have established and developed a plan for your care, including follow-up. 2. You are encouraged to schedule an appointment with your prescriber at our appointment center for follow-up visits whenever possible. 3. If you request a prescription for the controlled substance while at [facility] for an acute problem (with someone other than your regular prescriber), you MAY be given a ONE-TIME prescription for a 30-day supply of the controlled substance, to allow time for you to return to see your regular prescriber for additional prescriptions.

## 2014-04-20 NOTE — Progress Notes (Signed)
Subjective:  This chart was scribed for Norberto Sorenson, MD by Haywood Pao, ED Scribe at [facility].The patient was seen in exam room 01 and the patient's care was started at 4:05 PM.   Patient ID: Travis [name], male    DOB: 22-Aug-1965, 49 y.o.   MRN: [id number] Chief Complaint  Patient presents with  . Medication Refill    Without a doctor for 5 weeks/just got disability passed-waiting on paper work to come through   HPI HPI Comments: Travis [name] is a 49 y.o. male who presents to [facility] for a medication refill for his tramadol, clonidine and lisinopril. He was seen two weeks ago at [facility]. Has several comorbidity, depression, panic attacks and chronic pain due to spinal stenosis. Had said he had two seizures witnessed in th ER but wanted a refill on his tramadol and said his mother had thrown it away. He was told that tramadol can lower his seizure thresholds but he notes that his seizures are just panic attacks. He has numerous ER visits monthly despite being seen at [facility]. He already had two ER visits this month and three last month. He cannot be seen until  March 24th at [facility]. He just got disability on Friday. He takes clonidine BID, lisinopril qd and tramadol BID every 6 hours. He has been on tramadol for 8-9 years for spinal stenosis, degenerative disk disease, and arthritis in his back.    [facility] reviewed - pt has been Prescribed smalls amounts of tramadol from several physicians as well as one prescription of oxycodone 18 and tylenol 3. He was given 60 tabs of tramadol from Dr. Hyman Hopes on 04/07/2014 and 30 tabs on 04/17/2014. 120 tabs of tramadol from his PCP at [facility] which he was using every 2 weeks consistent with his stated use.   He is also concerned about a large area on the right side of his face beside his nose.  Past Medical History  Diagnosis Date  .  Hypertension   . Anxiety   . Tachycardia - pulse   . Degenerative disk disease   . Spinal stenosis   . Degenerative disk disease   . Tobacco abuse   . Obesity   . Polysubstance abuse   . Chest pain     Hospital, March, 2014, negative enzymes, patient refused in-hospital  stress test, patient canceled outpatient stress test  . Spinal stenosis   . Depression   . Neuromuscular disorder    Current Outpatient Prescriptions on File Prior to Visit  Medication Sig Dispense Refill  . cloNIDine (CATAPRES) 0.3 MG tablet Take 1 tablet (0.3 mg total) by mouth 2 (two) times daily. 180 tablet 3  . diphenhydrAMINE (BENADRYL) 25 MG tablet Take 25 mg by mouth every 6 (six) hours as needed for allergies (allergies).    Marland Kitchen ibuprofen (ADVIL,MOTRIN) 200 MG tablet Take 800 mg by mouth every 6 (six) hours as needed for headache or moderate pain (pain).     Marland Kitchen lisinopril (PRINIVIL,ZESTRIL) 20 MG tablet Take 1 tablet (20 mg total) by mouth every morning. For high blood pressure 90 tablet 3  . metoprolol (LOPRESSOR) 50 MG tablet Take 1 tablet (50 mg total) by mouth 2 (two) times daily. For high blood pressure 180 tablet 3  . ranitidine (ZANTAC) 75 MG tablet Take 1 tablet (75 mg total) by mouth daily as needed for heartburn (heartburn).  60 tablet 3  . traMADol (ULTRAM) 50 MG tablet Take 1 tablet (50 mg total) by mouth every 12 (twelve) hours as needed. 30 tablet 0  . [DISCONTINUED] nortriptyline (PAMELOR) 10 MG capsule Take 1 capsule (10 mg total) by mouth at bedtime. (Patient not taking: Reported on 02/27/2014) 30 capsule 0   No current facility-administered medications on file prior to visit.   Allergies  Allergen Reactions  . Blueberry Flavor Hives  . Penicillins Hives, Itching and Other (See Comments)    Hallucinations.  . Robaxin [Methocarbamol] Palpitations  . Flexeril [Cyclobenzaprine] Hives  . Toradol [Ketorolac Tromethamine] Hives and Other (See Comments)    Headache   Review of Systems    Constitutional: Negative for fever and chills.  Gastrointestinal: Negative for abdominal pain, diarrhea and constipation.  Genitourinary: Negative for urgency, frequency, decreased urine volume and difficulty urinating.  Musculoskeletal: Positive for myalgias, back pain, joint swelling, arthralgias and gait problem.  Skin: Negative for color change and wound.  Neurological: Positive for weakness. Negative for dizziness, light-headedness and numbness.  Psychiatric/Behavioral: Positive for sleep disturbance.       Objective:  BP 144/88 mmHg  Pulse 77  Temp(Src) 97.7 F (36.5 C) (Oral)  Resp 16  Ht  (1.803 m)  Wt 285 lb (129.275 kg)  BMI 39.77 kg/m2  SpO2 99%  Physical Exam  Constitutional: He is oriented to person, place, and time. He appears well-developed and well-nourished. No distress.  HENT:  Head: Normocephalic and atraumatic.  Eyes: Pupils are equal, round, and reactive to light.  Neck: Normal range of motion.  Cardiovascular: Regular rhythm, S1 normal and S2 normal.  Tachycardia present.   Pulmonary/Chest: Effort normal. No respiratory distress.  Lungs clear to ascultation bilaterally.  Musculoskeletal: Normal range of motion.  Neurological: He is alert and oriented to person, place, and time.  Skin: Skin is warm and dry.  5 mm papular with mild erythema, friable and hypervascular.  Psychiatric: He has a normal mood and affect. His behavior is normal.  Nursing note and vitals reviewed.      Assessment & Plan:   Essential hypertension  - Plan: cloNIDine (CATAPRES) 0.3 MG tablet, lisinopril (PRINIVIL,ZESTRIL) 20 MG tablet - pt already had a year supply of antihypertensives rx'ed by [facility] recently but pt was unaware of this so will resend to pharmacy today.  Chronic pain syndrome - Plan: traMADol (ULTRAM) 50 MG tablet - tramadol refilled x 2 mos to allow pt to establish with new PCP at [facility].  Has been stable on tramadol for >8 yrs - was just recently  given disability and medicaid secondary to his severe spinal stenosis.  Controlled sub policy reviewed w/ pt. Today I have utilized the [place] Controlled Substance Registry's online query to confirm compliance regarding the patient's narcotic pain medications. Patient is aware of our use of the system.  Will need OV for any additional refills.   Meds ordered this encounter  Medications  . diazepam (VALIUM) 10 MG tablet    Sig: Take 10 mg by mouth every 6 (six) hours as needed for anxiety.  . cloNIDine (CATAPRES) 0.3 MG tablet    Sig: Take 1 tablet (0.3 mg total) by mouth 2 (two) times daily.    Dispense:  180 tablet    Refill:  3  . lisinopril (PRINIVIL,ZESTRIL) 20 MG tablet    Sig: Take 1 tablet (20 mg total) by mouth every morning. For high blood pressure    Dispense:  90 tablet  Refill:  3  . traMADol (ULTRAM) 50 MG tablet    Sig: Take 2 tablets (100 mg total) by mouth every 6 (six) hours as needed. Do not use >8 tabs/day. Do not fill sooner than 30 days    Dispense:  240 tablet    Refill:  1    I personally performed the services described in this documentation, which was scribed in my presence. The recorded information has been reviewed and considered, and addended by me as needed.  Norberto SorensonEva Shaw, MD MPH

## 2014-05-16 ENCOUNTER — Ambulatory Visit (INDEPENDENT_AMBULATORY_CARE_PROVIDER_SITE_OTHER): Payer: Self-pay | Admitting: Family Medicine

## 2014-05-16 ENCOUNTER — Telehealth: Payer: Self-pay

## 2014-05-16 VITALS — BP 136/74 | HR 105 | Temp 97.6°F | Resp 18 | Ht 71.0 in | Wt 284.4 lb

## 2014-05-16 DIAGNOSIS — G894 Chronic pain syndrome: Secondary | ICD-10-CM

## 2014-05-16 DIAGNOSIS — M5116 Intervertebral disc disorders with radiculopathy, lumbar region: Secondary | ICD-10-CM

## 2014-05-16 DIAGNOSIS — I10 Essential (primary) hypertension: Secondary | ICD-10-CM

## 2014-05-16 MED ORDER — TRAMADOL HCL 50 MG PO TABS: 100.0000 mg | ORAL_TABLET | Freq: Four times a day (QID) | ORAL | Status: DC | PRN

## 2014-05-16 MED ORDER — METOPROLOL TARTRATE 50 MG PO TABS: 50.0000 mg | ORAL_TABLET | Freq: Two times a day (BID) | ORAL | Status: DC

## 2014-05-16 MED ORDER — LISINOPRIL 20 MG PO TABS: 20.0000 mg | ORAL_TABLET | Freq: Every morning | ORAL | Status: DC

## 2014-05-16 MED ORDER — CLONIDINE HCL 0.3 MG PO TABS: 0.3000 mg | ORAL_TABLET | Freq: Two times a day (BID) | ORAL | Status: DC

## 2014-05-16 NOTE — Telephone Encounter (Signed)
Pt.is currently here to be seen.
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
## 2014-05-16 NOTE — Telephone Encounter (Signed)
Pt states he lost his Klonapin, Tramadol   Best phone for pt is [phone]   Pharmacy [facility]

## 2014-05-17 ENCOUNTER — Telehealth: Payer: Self-pay | Admitting: Emergency Medicine

## 2014-05-17 NOTE — Telephone Encounter (Signed)
Responding to pt telephone advice record 05/14/14 states ., bp 170/142 , asymptomatic After looking at chart, pt went to Urgent Care center and received medication refills until f/u visit

## 2014-05-30 ENCOUNTER — Encounter (HOSPITAL_COMMUNITY): Payer: Self-pay | Admitting: *Deleted

## 2014-05-30 ENCOUNTER — Emergency Department (HOSPITAL_COMMUNITY): Payer: No Typology Code available for payment source

## 2014-05-30 ENCOUNTER — Emergency Department (HOSPITAL_COMMUNITY)
Admission: EM | Admit: 2014-05-30 | Discharge: 2014-05-30 | Disposition: A | Discharge status: Home / Self Care | Payer: No Typology Code available for payment source | Attending: Emergency Medicine | Admitting: Emergency Medicine

## 2014-05-30 DIAGNOSIS — R109 Unspecified abdominal pain: Secondary | ICD-10-CM

## 2014-05-30 DIAGNOSIS — K644 Residual hemorrhoidal skin tags: Secondary | ICD-10-CM | POA: Insufficient documentation

## 2014-05-30 DIAGNOSIS — R634 Abnormal weight loss: Secondary | ICD-10-CM | POA: Insufficient documentation

## 2014-05-30 DIAGNOSIS — Z79899 Other long term (current) drug therapy: Secondary | ICD-10-CM | POA: Insufficient documentation

## 2014-05-30 DIAGNOSIS — Z880 Allergy status to penicillin: Secondary | ICD-10-CM | POA: Insufficient documentation

## 2014-05-30 DIAGNOSIS — F419 Anxiety disorder, unspecified: Secondary | ICD-10-CM | POA: Insufficient documentation

## 2014-05-30 DIAGNOSIS — Z720 Tobacco use: Secondary | ICD-10-CM | POA: Insufficient documentation

## 2014-05-30 DIAGNOSIS — Z8739 Personal history of other diseases of the musculoskeletal system and connective tissue: Secondary | ICD-10-CM | POA: Insufficient documentation

## 2014-05-30 DIAGNOSIS — K921 Melena: Secondary | ICD-10-CM

## 2014-05-30 DIAGNOSIS — I10 Essential (primary) hypertension: Secondary | ICD-10-CM | POA: Insufficient documentation

## 2014-05-30 DIAGNOSIS — R1030 Lower abdominal pain, unspecified: Secondary | ICD-10-CM

## 2014-05-30 DIAGNOSIS — E669 Obesity, unspecified: Secondary | ICD-10-CM | POA: Insufficient documentation

## 2014-05-30 DIAGNOSIS — Z8669 Personal history of other diseases of the nervous system and sense organs: Secondary | ICD-10-CM | POA: Insufficient documentation

## 2014-05-30 DIAGNOSIS — F329 Major depressive disorder, single episode, unspecified: Secondary | ICD-10-CM | POA: Insufficient documentation

## 2014-05-30 LAB — COMPREHENSIVE METABOLIC PANEL
ALK PHOS: 99 U/L (ref 39–117)
ALT: 24 U/L (ref 0–53)
AST: 32 U/L (ref 0–37)
Albumin: 3.9 g/dL (ref 3.5–5.2)
Anion gap: 8 (ref 5–15)
BILIRUBIN TOTAL: 0.6 mg/dL (ref 0.3–1.2)
BUN: 6 mg/dL (ref 6–23)
CALCIUM: 8.7 mg/dL (ref 8.4–10.5)
CO2: 27 mmol/L (ref 19–32)
CREATININE: 0.93 mg/dL (ref 0.50–1.35)
Chloride: 99 mmol/L (ref 96–112)
GFR calc non Af Amer: >90 mL/min (ref >90)
Glucose, Bld: 133 mg/dL — ABNORMAL HIGH (ref 70–99)
Potassium: 4.2 mmol/L (ref 3.5–5.1)
SODIUM: 134 mmol/L — AB (ref 135–145)
TOTAL PROTEIN: 7.9 g/dL (ref 6.0–8.3)

## 2014-05-30 LAB — TYPE AND SCREEN
ABO/RH(D): B POS
Antibody Screen: NEGATIVE

## 2014-05-30 LAB — PROTIME-INR
INR: 1.05 (ref 0.00–1.49)
PROTHROMBIN TIME: 13.8 s (ref 11.6–15.2)

## 2014-05-30 LAB — CBC
HEMATOCRIT: 45.8 % (ref 39.0–52.0)
HEMOGLOBIN: 15.2 g/dL (ref 13.0–17.0)
MCH: 28.6 pg (ref 26.0–34.0)
MCHC: 33.2 g/dL (ref 30.0–36.0)
MCV: 86.1 fL (ref 78.0–100.0)
Platelets: 198 10*3/uL (ref 150–400)
RBC: 5.32 MIL/uL (ref 4.22–5.81)
RDW: 13 % (ref 11.5–15.5)
WBC: 6.8 10*3/uL (ref 4.0–10.5)

## 2014-05-30 LAB — I-STAT CG4 LACTIC ACID, ED: Lactic Acid, Venous: 1.39 mmol/L (ref 0.5–2.0)

## 2014-05-30 LAB — ABO/RH: ABO/RH(D): B POS

## 2014-05-30 MED ORDER — SODIUM CHLORIDE 0.9 % IV BOLUS (SEPSIS)
1000.0000 mL | Freq: Once | INTRAVENOUS | Status: AC
  Administered 2014-05-30: 1000 mL via INTRAVENOUS

## 2014-05-30 MED ORDER — IOHEXOL 300 MG/ML  SOLN
100.0000 mL | Freq: Once | INTRAMUSCULAR | Status: AC | PRN
  Administered 2014-05-30: 100 mL via INTRAVENOUS

## 2014-05-30 MED ORDER — MORPHINE SULFATE 4 MG/ML IJ SOLN
4.0000 mg | Freq: Once | INTRAMUSCULAR | Status: AC
Start: 2014-05-30 — End: 2014-05-30
  Administered 2014-05-30: 4 mg via INTRAVENOUS
  Filled 2014-05-30: qty 1

## 2014-05-30 MED ORDER — IOHEXOL 300 MG/ML  SOLN
50.0000 mL | Freq: Once | INTRAMUSCULAR | Status: AC | PRN
  Administered 2014-05-30: 50 mL via ORAL

## 2014-05-30 MED ORDER — HYDROCODONE-ACETAMINOPHEN 5-325 MG PO TABS: 1.0000 | ORAL_TABLET | Freq: Four times a day (QID) | ORAL | Status: DC | PRN

## 2014-05-30 MED ORDER — ONDANSETRON HCL 4 MG/2ML IJ SOLN
4.0000 mg | Freq: Once | INTRAMUSCULAR | Status: AC
  Administered 2014-05-30: 4 mg via INTRAVENOUS
  Filled 2014-05-30: qty 2

## 2014-05-30 NOTE — ED Notes (Signed)
Rectal bleeding intermittently for the last several months.  Due to insurance problems pt has not had follow up for this.  Pt reports that the rectal bleeding has been worse today and reports red blood with clots today.  Pt VSS for ems, pt reports some generalized weakness with this.

## 2014-05-30 NOTE — ED Notes (Signed)
Bed: WA08 Expected date:  Expected time:  Means of arrival:  Comments: Ems- rectal bleeding

## 2014-05-30 NOTE — ED Notes (Signed)
obviousl blood clots seen by EDP in rectum, no poc hemocult needed

## 2014-05-30 NOTE — Discharge Instructions (Signed)
Abdominal Pain °Many things can cause abdominal pain. Usually, abdominal pain is not caused by a disease and will improve without treatment. It can often be observed and treated at home. Your health care provider will do a physical exam and possibly order blood tests and X-rays to help determine the seriousness of your pain. However, in many cases, more time must pass before a clear cause of the pain can be found. Before that point, your health care provider may not know if you need more testing or further treatment. °HOME CARE INSTRUCTIONS  °Monitor your abdominal pain for any changes. The following actions may help to alleviate any discomfort you are experiencing: °· Only take over-the-counter or prescription medicines as directed by your health care provider. °· Do not take laxatives unless directed to do so by your health care provider. °· Try a clear liquid diet (broth, tea, or water) as directed by your health care provider. Slowly move to a bland diet as tolerated. °SEEK MEDICAL CARE IF: °· You have unexplained abdominal pain. °· You have abdominal pain associated with nausea or diarrhea. °· You have pain when you urinate or have a bowel movement. °· You experience abdominal pain that wakes you in the night. °· You have abdominal pain that is worsened or improved by eating food. °· You have abdominal pain that is worsened with eating fatty foods. °· You have a fever. °SEEK IMMEDIATE MEDICAL CARE IF:  °· Your pain does not go away within 2 hours. °· You keep throwing up (vomiting). °· Your pain is felt only in portions of the abdomen, such as the right side or the left lower portion of the abdomen. °· You pass bloody or black tarry stools. °MAKE SURE YOU: °· Understand these instructions.   °· Will watch your condition.   °· Will get help right away if you are not doing well or get worse.   °Document Released: 11/28/2004 Document Revised: 02/23/2013 Document Reviewed: 10/28/2012 °ExitCare® Patient Information  ©2015 [employer]. This information is not intended to replace advice given to you by your health care provider. Make sure you discuss any questions you have with your health care provider. ° °Bloody Stools °Bloody stools means there is blood in your poop (stool). It is a sign that there is a problem somewhere in the digestive system. It is important for your doctor to find the cause of your bleeding, so the problem can be treated.  °HOME CARE °· Only take medicine as told by your doctor. °· Eat foods with fiber (prunes, bran cereals). °· Drink enough fluids to keep your pee (urine) clear or pale yellow. °· Sit in warm water (sitz bath) for 10 to 15 minutes as told by your doctor. °· Know how to take your medicines (enemas, suppositories) if advised by your doctor. °· Watch for signs that you are getting better or getting worse. °GET HELP RIGHT AWAY IF:  °· You are not getting better. °· You start to get better but then get worse again. °· You have new problems. °· You have severe bleeding from the place where poop comes out (rectum) that does not stop. °· You throw up (vomit) blood. °· You feel weak or pass out (faint). °· You have a fever. °MAKE SURE YOU:  °· Understand these instructions. °· Will watch your condition. °· Will get help right away if you are not doing well or get worse. °Document Released: 02/06/2009 Document Revised: 05/13/2011 Document Reviewed: 07/06/2010 °ExitCare® Patient Information ©2015 [employer]. This   information is not intended to replace advice given to you by your health care provider. Make sure you discuss any questions you have with your health care provider. ° °

## 2014-05-30 NOTE — Progress Notes (Addendum)
EDCM spoke to patient at bedside.  Patient noted to have visited the ED 12 times within the last six months.  Patient confirms he has the orange card and that his pcp is [facility]. Patient reports [facility].  System updated

## 2014-05-30 NOTE — ED Provider Notes (Signed)
CSN: [id number]     Arrival date & time 05/30/14  1358 History   First MD Initiated Contact with Patient 05/30/14 1409     Chief Complaint  Patient presents with  . Rectal Bleeding     (Consider location/radiation/quality/duration/timing/severity/associated sxs/prior Treatment) Patient is a 49 y.o. male presenting with hematochezia. The history is provided by the patient.  Rectal Bleeding Quality: dark blood with clots.  normal stool color. Amount:  Copious Duration: has been intermittant for 2 months but 5 episodes in last 5 hours. Timing:  Intermittent Progression:  Worsening Chronicity:  New Context: defecation and hemorrhoids   Context: not constipation and not diarrhea   Similar prior episodes: no   Relieved by:  Nothing Worsened by:  Defecation Ineffective treatments:  None tried Associated symptoms: abdominal pain and light-headedness   Associated symptoms: no epistaxis, no fever, no hematemesis, no loss of consciousness and no vomiting   Risk factors: NSAID use   Risk factors: no anticoagulant use, no hx of colorectal cancer and no hx of colorectal surgery     Past Medical History  Diagnosis Date  . Hypertension   . Anxiety   . Tachycardia - pulse   . Degenerative disk disease   . Spinal stenosis   . Degenerative disk disease   . Tobacco abuse   . Obesity   . Polysubstance abuse   . Chest pain     Hospital, March, 2014, negative enzymes, patient refused in-hospital  stress test, patient canceled outpatient stress test  . Spinal stenosis   . Depression   . Neuromuscular disorder    History reviewed. No pertinent past surgical history. Family History  Problem Relation Age of Onset  . Stroke Mother   . Hypertension Mother   . Hyperlipidemia Mother   . Stroke Father   . Hypertension Father   . Dementia Father   . Diabetes Father   . Heart disease Father   . Hyperlipidemia Father   . Cancer Sister     brain  . Diabetes Sister   . Heart disease Sister    . Hyperlipidemia Sister   . Hypertension Sister   . Stroke Sister   . Heart disease Brother   . Hyperlipidemia Brother    History  Substance Use Topics  . Smoking status: Current Every Day Smoker -- 0.02 packs/day    Types: Cigarettes  . Smokeless tobacco: Never Used  . Alcohol Use: No    Review of Systems  Constitutional: Positive for unexpected weight change. Negative for fever.       46lb weight loss  HENT: Negative for nosebleeds.   Gastrointestinal: Positive for abdominal pain and hematochezia. Negative for vomiting and hematemesis.  Neurological: Positive for weakness and light-headedness. Negative for loss of consciousness.       Starting at 6pm last night  All other systems reviewed and are negative.     Allergies  Blueberry flavor; Penicillins; Robaxin; Flexeril; and Toradol  Home Medications   Prior to Admission medications   Medication Sig Start Date End Date Taking? Authorizing Provider  cloNIDine (CATAPRES) 0.3 MG tablet Take 1 tablet (0.3 mg total) by mouth 2 (two) times daily. 05/16/14  Yes Thao P Le, DO  Cyanocobalamin (VITAMIN B-12 PO) Take 1 tablet by mouth every evening.   Yes Historical Provider, MD  diazepam (VALIUM) 10 MG tablet Take 10 mg by mouth every 6 (six) hours as needed (For back spasms or anxiety.).    Yes Historical Provider, MD  diphenhydrAMINE (BENADRYL)  25 MG tablet Take 25 mg by mouth every 6 (six) hours as needed for allergies.    Yes Historical Provider, MD  ibuprofen (ADVIL,MOTRIN) 200 MG tablet Take 800 mg by mouth every 6 (six) hours as needed (For headache or pain.).    Yes Historical Provider, MD  lisinopril (PRINIVIL,ZESTRIL) 20 MG tablet Take 1 tablet (20 mg total) by mouth every morning. For high blood pressure 05/16/14  Yes Thao P Le, DO  Menthol, Topical Analgesic, (ICY HOT EX) Apply 1 application topically daily as needed (For pain.).   Yes Historical Provider, MD  ranitidine (ZANTAC) 75 MG tablet Take 1 tablet (75 mg total) by  mouth daily as needed for heartburn (heartburn). Patient taking differently: Take 75 mg by mouth daily as needed for heartburn.  04/07/14  Yes Quentin Angst, MD  sodium chloride (OCEAN) 0.65 % SOLN nasal spray Place 1 spray into both nostrils daily as needed for congestion.   Yes Historical Provider, MD  metoprolol (LOPRESSOR) 50 MG tablet Take 1 tablet (50 mg total) by mouth 2 (two) times daily. For high blood pressure Patient not taking: Reported on 05/30/2014 05/16/14   Thao P Le, DO  traMADol (ULTRAM) 50 MG tablet Take 2 tablets (100 mg total) by mouth every 6 (six) hours as needed. Do not use >8 tabs/day. Patient not taking: Reported on 05/30/2014 05/16/14   Thao P Le, DO   BP 130/88 mmHg  Pulse 78  Temp(Src) 98.4 F (36.9 C) (Oral)  Resp 16  SpO2 100% Physical Exam  Constitutional: He is oriented to person, place, and time. He appears well-developed and well-nourished. No distress.  HENT:  Head: Normocephalic and atraumatic.  Mouth/Throat: Oropharynx is clear and moist.  Eyes: Conjunctivae and EOM are normal. Pupils are equal, round, and reactive to light.  Neck: Normal range of motion. Neck supple.  Cardiovascular: Normal rate, regular rhythm and intact distal pulses.   No murmur heard. Pulmonary/Chest: Effort normal and breath sounds normal. No respiratory distress. He has no wheezes. He has no rales.  Abdominal: Soft. He exhibits no distension. There is tenderness in the left lower quadrant. There is no rebound and no guarding.  Genitourinary: Rectal exam shows external hemorrhoid.  nonbleeding external hemorroids.  Blood clot at the rectum  Musculoskeletal: Normal range of motion. He exhibits no edema or tenderness.  Neurological: He is alert and oriented to person, place, and time.  Skin: Skin is warm and dry. No rash noted. No erythema.  Psychiatric: He has a normal mood and affect. His behavior is normal.  Nursing note and vitals reviewed.   ED Course  Procedures  (including critical care time) Labs Review Labs Reviewed  CBC  PROTIME-INR  COMPREHENSIVE METABOLIC PANEL  I-STAT CG4 LACTIC ACID, ED  TYPE AND SCREEN    Imaging Review No results found.   EKG Interpretation None      MDM   Final diagnoses:  None   Patient presenting with 5 episodes of dark bloody stool with clots present in the last 5 hours. Patient has had intermittent bloody stool for the last 2 months and following up with health and wellness eventually to be seen by gastroenterology. He states at 6 PM last night he started feeling weak and lightheaded. The bloody stool started today. He also has left lower quadrant abdominal pain and 46 para weight loss in the last few months. He denies any alcohol or drug use. He does have history of hypertension and takes 3 blood pressure  medications. On exam he is hemodynamically stable but appears uncomfortable. He has blood clots present at the rectum and some nonbleeding external hemorrhoids.  CBC, CMP, lactate, type and screen, PT/INR pending. Patient does take a baby aspirin daily.  Patient given pain control and will need to do a CT to further evaluate given his abdominal pain.  CBC with a stable hemoglobin of 15. Lactic acid within normal limits. CMP pending.  3:41 PM pt checked out to Dr. Lisabeth Pick, MD 05/30/14 [phone]

## 2014-06-16 ENCOUNTER — Ambulatory Visit (INDEPENDENT_AMBULATORY_CARE_PROVIDER_SITE_OTHER): Payer: Self-pay | Admitting: Family Medicine

## 2014-06-16 VITALS — BP 126/74 | HR 64 | Temp 97.3°F | Resp 18 | Ht 72.5 in | Wt 290.0 lb

## 2014-06-16 DIAGNOSIS — G894 Chronic pain syndrome: Secondary | ICD-10-CM

## 2014-06-16 MED ORDER — TRAMADOL HCL 50 MG PO TABS: 100.0000 mg | ORAL_TABLET | Freq: Four times a day (QID) | ORAL | Status: DC | PRN

## 2014-06-16 NOTE — Progress Notes (Signed)
Subjective:    Patient ID: Travis [name], male    DOB: [birth year], 49 y.o.   MRN: [id number] This chart was scribed for Norberto SorensonEva Erryn Dickison, MD by Littie Deedsichard Sun, Medical Scribe. This patient was seen in Room 2 and the patient's care was started at 11:00 AM.  Chief Complaint  Patient presents with  . Follow-up    back   . Back Pain    HPI HPI Comments: Travis [name] is a 49 y.o. male with a hx of HTN, DDD and spinal stenosis who presents to the [facility] for a follow-up for back pain.   Patient with chronic pain and new medical disability due to DDD and spinal stenosis. Hx of panic attacks and depression with frequent ER visits. I saw him 2 months ago and he had been seen at [facility], and had been planning to establish care at [facility]. He had been taking tramadol QID for 8-9 years. At that time, I refilled his tramadol for 2 months to give him time to establish with new PCP, as he had just gotten Medicaid. Patient then stated he lost his tramadol and Klonopin after 1 month; stated it was thrown out by a family member (sister). Of note, when patient was in the ER 2 months ago, he claimed the same thing - thrown out by mother. He was actually due to have his tramadol refilled in 3 days, which he already had refilled. He also had an appointment with his new PCP on March 24th. Patient's tramadol was refilled for a 3 day supply to last him until he could get a refill prescribed by myself. Seen in ER 2 weeks ago for rectal bleeding. Blood clots at rectum were seen with addition to LLQ pain and 46 pound weight loss in the past several months was noted by the ER physician. However, chart review does not show any significant weight loss. Abdominal CT was normal, but found hepatic steatosis.  Back Pain: Patient does not have insurance currently; he is waiting on Medicaid. He did go to [facility] to see Vanessa DurhamMichelle Jenkins, PA-C, but he states  that they do not prescribe pain medications there. He was told that it will take him 4-18 months to get into a pain clinic without insurance. Vanessa DurhamMichelle Jenkins did take care of his blood pressure medications at the visit. Patient lives at home with his mother; his sister recently moved out. She had been found to have hepatitis C; she started treatment and has been doing well. He states that he and his previous PCP did not get along, and he was kicked out of the previous clinic over clonidine.  Review of Systems  Constitutional: Positive for activity change and fatigue. Negative for fever, chills, appetite change and unexpected weight change.  Gastrointestinal: Negative for diarrhea and constipation.  Musculoskeletal: Positive for myalgias, back pain, joint swelling, arthralgias and gait problem.  Neurological: Positive for weakness and headaches.  Psychiatric/Behavioral: Positive for sleep disturbance and dysphoric mood.       Objective:  BP 126/74 mmHg  Pulse 64  Temp(Src) 97.3 F (36.3 C) (Oral)  Resp 18  Ht 6' 0.5" (1.842 m)  Wt 290 lb (131.543 kg)  BMI 38.77 kg/m2  SpO2 97%  Physical Exam  Constitutional: He is oriented to person, place, and time. He appears well-developed and well-nourished. No distress.  HENT:  Head: Normocephalic and atraumatic.  Mouth/Throat: Oropharynx is clear and moist. No  oropharyngeal exudate.  Eyes: Pupils are equal, round, and reactive to light.  Neck: Neck supple.  Cardiovascular: Normal rate.   Pulmonary/Chest: Effort normal.  Musculoskeletal: He exhibits no edema.  Neurological: He is alert and oriented to person, place, and time. No cranial nerve deficit.  Skin: Skin is warm and dry. No rash noted.  Psychiatric: He has a normal mood and affect. His behavior is normal.  Vitals reviewed.         Assessment & Plan:   1. Chronic pain syndrome   Controlled drug policy reviewed in detail w/ pt and info given in AVS.  Pt thought he was going to  get medicaid due to his recent disbility status finally being awarded but has now relized in might be 6 mos to 2 yrs for this to start. He was seen at [facility] w/ [facility] but they do not prescribe any controlled drugs or do pain managent at all.  They will refer him to a pain clinic for this but he cannot afford while he doesn't have insurance. I agreed to provide chronic tramadol for pt as long as he is compliant and holds up a controlled drug contract w/ me. No fills from other providers for same condition. If he has an acute condition for which he is prescribed a controlled sub he needs to alert my office before filling. Cannot use tramadol more than rx'ed.  Will need to RTC for additional refills in 6 mos w/ me.  Today I have utilized the [place] Controlled Substance Registry's online query to confirm compliance regarding the patient's narcotic pain medications. My review reveals appropriate prescription fills and that [facility] is the sole provider of these medications. Rechecks will occur regularly and the patient is aware of our use of the system.  Meds ordered this encounter  Medications  . traMADol (ULTRAM) 50 MG tablet    Sig: Take 2 tablets (100 mg total) by mouth every 6 (six) hours as needed. Do not use >8 tabs/day.    Dispense:  240 tablet    Refill:  5    Do not fill more frequently than every 28 days    I personally performed the services described in this documentation, which was scribed in my presence. The recorded information has been reviewed and considered, and addended by me as needed.  Norberto Sorenson, MD MPH

## 2014-06-16 NOTE — Patient Instructions (Signed)
[facility] Policy for Prescribing Controlled Substances (Revised 01/2012) 1. Prescriptions for controlled substances will be filled by ONE provider at [facility] with whom you have established and developed a plan for your care, including follow-up. 2. You are encouraged to schedule an appointment with your prescriber at our appointment center for follow-up visits whenever possible. If you request a prescription for the controlled substance while at [facility] for an acute problem (with someone other than your regular prescriber), you MAY be given a ONE-TIME prescription for a 30-day supply of the controlled substance, to allow time for you to return to see your regular prescriber for additional prescriptions. If you have any further problems make sure you come back in.  If you need a refill please some in to see me.  You are an exception to my usual standards - I always make everyone else come in every 3 months but in this one situation we will do 6 months.

## 2014-06-27 ENCOUNTER — Encounter (HOSPITAL_COMMUNITY): Payer: Self-pay | Admitting: Emergency Medicine

## 2014-06-27 ENCOUNTER — Emergency Department (HOSPITAL_COMMUNITY): Payer: No Typology Code available for payment source

## 2014-06-27 ENCOUNTER — Emergency Department (HOSPITAL_COMMUNITY)
Admission: EM | Admit: 2014-06-27 | Discharge: 2014-06-27 | Disposition: A | Discharge status: Home / Self Care | Payer: No Typology Code available for payment source | Attending: Emergency Medicine | Admitting: Emergency Medicine

## 2014-06-27 ENCOUNTER — Telehealth: Payer: Self-pay

## 2014-06-27 DIAGNOSIS — Z880 Allergy status to penicillin: Secondary | ICD-10-CM | POA: Insufficient documentation

## 2014-06-27 DIAGNOSIS — Z79899 Other long term (current) drug therapy: Secondary | ICD-10-CM | POA: Insufficient documentation

## 2014-06-27 DIAGNOSIS — Y998 Other external cause status: Secondary | ICD-10-CM | POA: Insufficient documentation

## 2014-06-27 DIAGNOSIS — F419 Anxiety disorder, unspecified: Secondary | ICD-10-CM | POA: Insufficient documentation

## 2014-06-27 DIAGNOSIS — E669 Obesity, unspecified: Secondary | ICD-10-CM | POA: Insufficient documentation

## 2014-06-27 DIAGNOSIS — S4991XA Unspecified injury of right shoulder and upper arm, initial encounter: Secondary | ICD-10-CM | POA: Insufficient documentation

## 2014-06-27 DIAGNOSIS — Z8669 Personal history of other diseases of the nervous system and sense organs: Secondary | ICD-10-CM | POA: Insufficient documentation

## 2014-06-27 DIAGNOSIS — M545 Low back pain, unspecified: Secondary | ICD-10-CM

## 2014-06-27 DIAGNOSIS — S8991XA Unspecified injury of right lower leg, initial encounter: Secondary | ICD-10-CM | POA: Insufficient documentation

## 2014-06-27 DIAGNOSIS — M25511 Pain in right shoulder: Secondary | ICD-10-CM

## 2014-06-27 DIAGNOSIS — F329 Major depressive disorder, single episode, unspecified: Secondary | ICD-10-CM | POA: Insufficient documentation

## 2014-06-27 DIAGNOSIS — S3992XA Unspecified injury of lower back, initial encounter: Secondary | ICD-10-CM | POA: Insufficient documentation

## 2014-06-27 DIAGNOSIS — S8992XA Unspecified injury of left lower leg, initial encounter: Secondary | ICD-10-CM | POA: Insufficient documentation

## 2014-06-27 DIAGNOSIS — W19XXXA Unspecified fall, initial encounter: Secondary | ICD-10-CM

## 2014-06-27 DIAGNOSIS — I10 Essential (primary) hypertension: Secondary | ICD-10-CM | POA: Insufficient documentation

## 2014-06-27 DIAGNOSIS — Z8739 Personal history of other diseases of the musculoskeletal system and connective tissue: Secondary | ICD-10-CM | POA: Insufficient documentation

## 2014-06-27 DIAGNOSIS — Y9289 Other specified places as the place of occurrence of the external cause: Secondary | ICD-10-CM | POA: Insufficient documentation

## 2014-06-27 DIAGNOSIS — Y9389 Activity, other specified: Secondary | ICD-10-CM | POA: Insufficient documentation

## 2014-06-27 DIAGNOSIS — W1839XA Other fall on same level, initial encounter: Secondary | ICD-10-CM | POA: Insufficient documentation

## 2014-06-27 MED ORDER — HYDROCODONE-ACETAMINOPHEN 5-325 MG PO TABS
1.0000 | ORAL_TABLET | Freq: Once | ORAL | Status: AC
  Administered 2014-06-27: 1 via ORAL
  Filled 2014-06-27: qty 1

## 2014-06-27 NOTE — Telephone Encounter (Signed)
Patient fell today and went to the ER, stated that the ER doctor told him get in touch with Dr. Clelia CroftShaw if he needed pain meds, so he was wondering if there was anything she could give him because he is in a lot of pain and care barely walk. Stated that he did not break anything when he fell just sprained his right shoulder, states his back is hurting the worst and he really needs something.  Stated that he was given one Vicodin at the ER and they wouldn't give him anything else, they told him to keep taking his tramadol but he states it isn't working.  He would like to have someone call him back at [phone].

## 2014-06-27 NOTE — Telephone Encounter (Signed)
Pt with history of polysubstance abuse. He is in ER and they won't give him pain meds.

## 2014-06-27 NOTE — ED Provider Notes (Signed)
CSN: [id number]     Arrival date & time 06/27/14  1651 History  This chart was scribed for non-physician practitioner, Teressa LowerVrinda Anokhi Shannon, NP, working with Pricilla LovelessScott Goldston, MD, by Lionel DecemberHatice Demirci, ED Scribe. This patient was seen in room WTR6/WTR6 and the patient's care was started at 4:54 PM.    Chief Complaint  Patient presents with  . Fall  . Knee Pain  . Back Pain    low back pain   Patient is a 49 y.o. male presenting with fall, knee pain, and back pain. The history is provided by the patient. No language interpreter was used.  Fall  Knee Pain Associated symptoms: back pain   Back Pain   HPI Comments: Travis LimboJames Tamashiro is a 49 y.o. male who presents to the Emergency Department complaining of back, leg and knee pain after falling and catching himself with his arm prior to arrival.  Patient notes that he couldn't get up so his sister called the ambulance.  Patient states that his legs are not working due to his spinal stenosis.  He states that his legs usually hurt but it has never hurt like today.  He currently denies any neck pain.  Patient has chronic pain and takes Tramadol.  He has a PCP who treats him.  He has no other complaints today.   Past Medical History  Diagnosis Date  . Hypertension   . Anxiety   . Tachycardia - pulse   . Degenerative disk disease   . Spinal stenosis   . Degenerative disk disease   . Tobacco abuse   . Obesity   . Polysubstance abuse   . Chest pain     Hospital, March, 2014, negative enzymes, patient refused in-hospital  stress test, patient canceled outpatient stress test  . Spinal stenosis   . Depression   . Neuromuscular disorder    No past surgical history on file. Family History  Problem Relation Age of Onset  . Stroke Mother   . Hypertension Mother   . Hyperlipidemia Mother   . Stroke Father   . Hypertension Father   . Dementia Father   . Diabetes Father   . Heart disease Father   . Hyperlipidemia Father   . Cancer Sister     brain  .  Diabetes Sister   . Heart disease Sister   . Hyperlipidemia Sister   . Hypertension Sister   . Stroke Sister   . Heart disease Brother   . Hyperlipidemia Brother    History  Substance Use Topics  . Smoking status: Current Every Day Smoker -- 0.02 packs/day    Types: Cigarettes  . Smokeless tobacco: Never Used  . Alcohol Use: No    Review of Systems  Musculoskeletal: Positive for back pain.  All other systems reviewed and are negative.     Allergies  Blueberry flavor; Penicillins; Robaxin; Flexeril; and Toradol  Home Medications   Prior to Admission medications   Medication Sig Start Date End Date Taking? Authorizing Provider  cloNIDine (CATAPRES) 0.3 MG tablet Take 1 tablet (0.3 mg total) by mouth 2 (two) times daily. 05/16/14   Thao P Le, DO  Cyanocobalamin (VITAMIN B-12 PO) Take 1 tablet by mouth every evening.    Historical Provider, MD  diazepam (VALIUM) 10 MG tablet Take 10 mg by mouth every 6 (six) hours as needed (For back spasms or anxiety.).     Historical Provider, MD  diphenhydrAMINE (BENADRYL) 25 MG tablet Take 25 mg by mouth every 6 (six) hours  as needed for allergies.     Historical Provider, MD  ibuprofen (ADVIL,MOTRIN) 200 MG tablet Take 800 mg by mouth every 6 (six) hours as needed (For headache or pain.).     Historical Provider, MD  lisinopril (PRINIVIL,ZESTRIL) 20 MG tablet Take 1 tablet (20 mg total) by mouth every morning. For high blood pressure 05/16/14   Thao P Le, DO  Menthol, Topical Analgesic, (ICY HOT EX) Apply 1 application topically daily as needed (For pain.).    Historical Provider, MD  metoprolol (LOPRESSOR) 50 MG tablet Take 1 tablet (50 mg total) by mouth 2 (two) times daily. For high blood pressure 05/16/14   Thao P Le, DO  ranitidine (ZANTAC) 75 MG tablet Take 1 tablet (75 mg total) by mouth daily as needed for heartburn (heartburn). Patient taking differently: Take 75 mg by mouth daily as needed for heartburn.  04/07/14   Quentin Angst,  MD  sodium chloride (OCEAN) 0.65 % SOLN nasal spray Place 1 spray into both nostrils daily as needed for congestion.    Historical Provider, MD  traMADol (ULTRAM) 50 MG tablet Take 2 tablets (100 mg total) by mouth every 6 (six) hours as needed. Do not use >8 tabs/day. 06/16/14   Sherren Mocha, MD   BP 118/76 mmHg  Pulse 77  Temp(Src) 98.3 F (36.8 C) (Oral)  Resp 16  SpO2 95% Physical Exam  Constitutional: He is oriented to person, place, and time. He appears well-developed and well-nourished. No distress.  HENT:  Head: Normocephalic and atraumatic.  Eyes: Conjunctivae and EOM are normal.  Neck: Neck supple. No tracheal deviation present.  Cardiovascular: Normal rate.   Pulmonary/Chest: Effort normal. No respiratory distress.  Musculoskeletal: Normal range of motion.       Cervical back: Normal.       Thoracic back: Normal.       Lumbar back: He exhibits bony tenderness.       Arms: Able to do bilateral straight leg raises  Neurological: He is alert and oriented to person, place, and time.  Skin: Skin is warm and dry.  Psychiatric: He has a normal mood and affect. His behavior is normal.  Nursing note and vitals reviewed.   ED Course  Procedures (including critical care time) DIAGNOSTIC STUDIES: Oxygen Saturation is 95% on RA, adequate by my interpretation.    COORDINATION OF CARE: 5:05 PM Discussed treatment plan with patient at beside, the patient agrees with the plan and has no further questions at this time.  Labs Review Labs Reviewed - No data to display  Imaging Review Dg Lumbar Spine Complete  06/27/2014   CLINICAL DATA:  Pt fell in the breez[place]ay of his home and complains of pain in the right shoulder and lumbar spine.  EXAM: LUMBAR SPINE - COMPLETE 4+ VIEW  COMPARISON:  CT, 05/30/2014  FINDINGS: No fracture or spondylolisthesis.  Endplate spurring is noted throughout the visualized spine most prominently at the thoracolumbar junction. Disc spaces are relatively well  preserved. Mild facet joint sclerosis at L5-S1 bilaterally. Soft tissues are unremarkable.  IMPRESSION: No fracture or acute finding. Mild degenerative changes. Stable appearance from the prior CT.   Electronically Signed   By: Amie Portland M.D.   On: 06/27/2014 18:16   Dg Shoulder Right  06/27/2014   CLINICAL DATA:  Post fall, now with right shoulder pain.  EXAM: RIGHT SHOULDER - 2+ VIEW  COMPARISON:  Dense is 18/20 15  FINDINGS: No fracture or dislocation. Glenohumeral and acromioclavicular joint  spaces appear preserved given obliquity. No evidence of calcific tendinitis. Regional soft tissues appear normal. Limited visualization of the adjacent thorax is normal.  IMPRESSION: No acute findings.   Electronically Signed   By: Simonne Come M.D.   On: 06/27/2014 18:16     EKG Interpretation None      MDM   Final diagnoses:  Fall, initial encounter  Midline low back pain without sciatica  Right shoulder pain   No acute injury noted. Pt has ultram at home and told him he could use that. He states "that shit doesn't work but Dr. Clelia Croft won't give me anything else."   I personally performed the services described in this documentation, which was scribed in my presence. The recorded information has been reviewed and is accurate.    Teressa Lower, NP 06/27/14 1610  Pricilla Loveless, MD 06/28/14 [phone]

## 2014-06-27 NOTE — ED Notes (Signed)
Bed: WTR6 Expected date:  Expected time:  Means of arrival:  Comments: EMS/LSB/fall

## 2014-06-27 NOTE — ED Notes (Signed)
Per Medic #110-EMS called. Pt found on floor. Alert, oriented and appropriate. Denies LOC. Denies neck pain. C/o low back pain, knee pain both sides, r/upper arm pain. Pt reported that his legs became weak and he felt to floor. No swelling or lacerations noted.LSB in use. Pt stated this is similar to his usual back , only worse

## 2014-06-27 NOTE — Discharge Instructions (Signed)

## 2014-06-27 NOTE — ED Notes (Signed)
Per  Patient -His sister called EMS when he felt weak in the knees and fell to the floor. Denies LOC, denies striking his head. . Pain is in r/arm, low back, both knees. Stated that this has happened before

## 2014-06-28 MED ORDER — OXYCODONE-ACETAMINOPHEN 5-325 MG PO TABS: 1.0000 | ORAL_TABLET | Freq: Three times a day (TID) | ORAL | Status: DC | PRN

## 2014-06-28 NOTE — Telephone Encounter (Signed)
I have written the script per Dr. Clelia CroftShaw. Please advise patient it is ready for him to pick up tomorrow.

## 2014-06-28 NOTE — Telephone Encounter (Signed)
Patient is very lucky that the ER doctor refused to prescribe him anything.  As he has no significants findings on imaging or exam from the fall, if pt had accepted a rx for a narcotic, I would have cancelled his chronic tramadol that I have agreed to provide.  I have only had pt under pain contract for 3 months and this is his second ER visit for an exacerbation of his pain.  The ER note summary is below in its entirety - I am disappointed in him - if it doesn't work, than he probably shouldn't take it.  Fall, initial encounter  Midline low back pain without sciatica  Right shoulder pain   No acute injury noted. Pt has ultram at home and told him he could use that. He states "that shit doesn't work but Dr. Clelia CroftShaw won't give me anything else."         I will give pt oxycodone-apap 5mg  #15, no refills. Please ask pa to write - [facility] aware.  However, pt has had many er visits, discarded meds, and now appears to not even want what I digressed from our normal protocols to prescribe him which is not where I was hoping our therapeutic relationship will head.  No controlled meds from other providers uness he breaks a bone or has surgery.  He can have other doctors or the er send me a note or call if they think he needs more pain medicine. Since we are on the same chart as er,  and wellness, etc this is very simple to do.

## 2014-06-28 NOTE — Telephone Encounter (Signed)
Patient called again about the previous messages that were left.  [phone]

## 2014-06-29 NOTE — Telephone Encounter (Signed)
Gave pt message. He says he wasn't asking for oxy. He states he is bleeding in his urine. Told him to return to clinic. He states that every time he goes to the ER they say that that is all he is there for, "pain meds".

## 2014-07-10 ENCOUNTER — Other Ambulatory Visit: Payer: Self-pay | Admitting: Internal Medicine

## 2014-07-13 ENCOUNTER — Telehealth: Payer: Self-pay

## 2014-07-13 NOTE — Telephone Encounter (Signed)
Does pt need to RTC to discuss? 

## 2014-07-13 NOTE — Telephone Encounter (Signed)
Pt wanted Dr Clelia CroftShaw to know he went to the gastro and they want to do a colonospy and they found he have an intestional issue and want the Dr to call in some Dicyclomine 200 mg for him Please call [phone]. Wanted her to know is b/p is really good 129/63 today

## 2014-07-14 NOTE — Telephone Encounter (Signed)
Ok, i told pt to let me know if he was put on a CONTROLLED PAIN medicine since I am writing his pain medicine - but appreciate the communication and update. thanks

## 2014-07-14 NOTE — Telephone Encounter (Signed)
Normally if the Gi doctor wants pt to have a medicine - they just prescribe it.  Please find out where pt was seen and obtain the Gi note for review and/or just call the GI office to get the scoop - what intestinal issue does he have and do they want us to medically manage it.

## 2014-07-14 NOTE — Telephone Encounter (Signed)
Pt said he was just calling because you told him to let you know anytime he was put on a medicine. He is having colonoscopy on 6/22. He was just wanting to give you this information. He thanks you for all of your help.

## 2014-08-01 NOTE — Telephone Encounter (Signed)
I called patient to advise. Unfortunately phone rings once and does not pick up
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
## 2014-08-07 ENCOUNTER — Emergency Department (HOSPITAL_COMMUNITY)
Admission: EM | Admit: 2014-08-07 | Discharge: 2014-08-07 | Disposition: A | Discharge status: Home / Self Care | Payer: No Typology Code available for payment source | Attending: Emergency Medicine | Admitting: Emergency Medicine

## 2014-08-07 ENCOUNTER — Encounter (HOSPITAL_COMMUNITY): Payer: Self-pay | Admitting: Vascular Surgery

## 2014-08-07 ENCOUNTER — Emergency Department (HOSPITAL_COMMUNITY): Payer: No Typology Code available for payment source

## 2014-08-07 DIAGNOSIS — E669 Obesity, unspecified: Secondary | ICD-10-CM | POA: Insufficient documentation

## 2014-08-07 DIAGNOSIS — Z79899 Other long term (current) drug therapy: Secondary | ICD-10-CM | POA: Insufficient documentation

## 2014-08-07 DIAGNOSIS — S29092A Other injury of muscle and tendon of back wall of thorax, initial encounter: Secondary | ICD-10-CM | POA: Insufficient documentation

## 2014-08-07 DIAGNOSIS — Z880 Allergy status to penicillin: Secondary | ICD-10-CM | POA: Insufficient documentation

## 2014-08-07 DIAGNOSIS — F329 Major depressive disorder, single episode, unspecified: Secondary | ICD-10-CM | POA: Insufficient documentation

## 2014-08-07 DIAGNOSIS — F419 Anxiety disorder, unspecified: Secondary | ICD-10-CM | POA: Insufficient documentation

## 2014-08-07 DIAGNOSIS — I10 Essential (primary) hypertension: Secondary | ICD-10-CM | POA: Insufficient documentation

## 2014-08-07 DIAGNOSIS — Y9289 Other specified places as the place of occurrence of the external cause: Secondary | ICD-10-CM | POA: Insufficient documentation

## 2014-08-07 DIAGNOSIS — Y998 Other external cause status: Secondary | ICD-10-CM | POA: Insufficient documentation

## 2014-08-07 DIAGNOSIS — S199XXA Unspecified injury of neck, initial encounter: Secondary | ICD-10-CM | POA: Insufficient documentation

## 2014-08-07 DIAGNOSIS — W208XXA Other cause of strike by thrown, projected or falling object, initial encounter: Secondary | ICD-10-CM | POA: Insufficient documentation

## 2014-08-07 DIAGNOSIS — Z8739 Personal history of other diseases of the musculoskeletal system and connective tissue: Secondary | ICD-10-CM | POA: Insufficient documentation

## 2014-08-07 DIAGNOSIS — Z720 Tobacco use: Secondary | ICD-10-CM | POA: Insufficient documentation

## 2014-08-07 DIAGNOSIS — Z8669 Personal history of other diseases of the nervous system and sense organs: Secondary | ICD-10-CM | POA: Insufficient documentation

## 2014-08-07 DIAGNOSIS — S4992XA Unspecified injury of left shoulder and upper arm, initial encounter: Secondary | ICD-10-CM | POA: Insufficient documentation

## 2014-08-07 DIAGNOSIS — Y9389 Activity, other specified: Secondary | ICD-10-CM | POA: Insufficient documentation

## 2014-08-07 MED ORDER — DIAZEPAM 5 MG PO TABS
10.0000 mg | ORAL_TABLET | Freq: Once | ORAL | Status: AC
  Administered 2014-08-07: 10 mg via ORAL
  Filled 2014-08-07: qty 2

## 2014-08-07 NOTE — ED Notes (Signed)
Declined W/C at D/C and was escorted to lobby by RN. 

## 2014-08-07 NOTE — ED Provider Notes (Signed)
CSN: [id number]     Arrival date & time 08/07/14  1208 History  This chart was scribed for non-physician practitioner, Oswaldo Conroy, PA-C, working with Lorre Nick, MD, by Ronney Lion, ED Scribe. This patient was seen in room TR06C/TR06C and the patient's care was started at 2:06 PM.    Chief Complaint  Patient presents with  . Back Pain  . Shoulder Pain   The history is provided by the patient. No language interpreter was used.     HPI Comments: Travis [name] is a 49 y.o. male with a history of hypertension and spinal stenosis in L4-L5,  who presents to the Emergency Department complaining of left shoulder pain after a box in his closet fell onto his shoulder last night. He states the box almost knocked him down, and he caught himself with his left hand. He denies any head injury or LOC. He does endorse "a little" neck pain. He states that he is having difficulty "making a fist" with his left hand secondary to pain in shoulder. Exposure to cold air exacerbates the pain. Patient has tried ibuprofen and a heating pad with minimal relief. He also took Tramadol with no relief. Patient states he has taken Valium in the past for muscle spasms with significant relief. He states he has known allergies to Robaxin and Flexeril, which cause hives.   Past Medical History  Diagnosis Date  . Hypertension   . Anxiety   . Tachycardia - pulse   . Degenerative disk disease   . Spinal stenosis   . Degenerative disk disease   . Tobacco abuse   . Obesity   . Polysubstance abuse   . Chest pain     Hospital, March, 2014, negative enzymes, patient refused in-hospital  stress test, patient canceled outpatient stress test  . Spinal stenosis   . Depression   . Neuromuscular disorder    Past Surgical History  Procedure Laterality Date  . Wisdom tooth extraction     Family History  Problem Relation Age of Onset  . Stroke Mother   . Hypertension Mother   . Hyperlipidemia Mother   . Stroke Father   .  Hypertension Father   . Dementia Father   . Diabetes Father   . Heart disease Father   . Hyperlipidemia Father   . Cancer Sister     brain  . Diabetes Sister   . Heart disease Sister   . Hyperlipidemia Sister   . Hypertension Sister   . Stroke Sister   . Heart disease Brother   . Hyperlipidemia Brother    History  Substance Use Topics  . Smoking status: Current Every Day Smoker -- 0.02 packs/day    Types: Cigarettes  . Smokeless tobacco: Never Used  . Alcohol Use: No    Review of Systems  Constitutional: Negative for fever and chills.  Gastrointestinal: Negative for nausea and vomiting.  Musculoskeletal: Positive for arthralgias and neck pain.      Allergies  Blueberry flavor; Penicillins; Robaxin; Flexeril; and Toradol  Home Medications   Prior to Admission medications   Medication Sig Start Date End Date Taking? Authorizing Provider  cloNIDine (CATAPRES) 0.3 MG tablet TAKE ONE TABLET BY MOUTH TWICE DAILY 07/22/14   Quentin Angst, MD  Cyanocobalamin (VITAMIN B-12 PO) Take 1 tablet by mouth every evening.    Historical Provider, MD  diazepam (VALIUM) 10 MG tablet Take 10 mg by mouth every 6 (six) hours as needed (back spasms).     Historical Provider,  MD  diphenhydrAMINE (BENADRYL) 25 MG tablet Take 25 mg by mouth every 6 (six) hours as needed for allergies.     Historical Provider, MD  lisinopril (PRINIVIL,ZESTRIL) 20 MG tablet Take 1 tablet (20 mg total) by mouth every morning. For high blood pressure 05/16/14   Thao P Le, DO  metoprolol (LOPRESSOR) 50 MG tablet Take 1 tablet (50 mg total) by mouth 2 (two) times daily. For high blood pressure 05/16/14   Thao P Le, DO  oxyCODONE-acetaminophen (ROXICET) 5-325 MG per tablet Take 1 tablet by mouth every 8 (eight) hours as needed for severe pain. 06/28/14   Wallis Bamberg, PA-C  ranitidine (ZANTAC) 75 MG tablet Take 1 tablet (75 mg total) by mouth daily as needed for heartburn (heartburn). Patient taking differently: Take 75  mg by mouth daily as needed for heartburn.  04/07/14   Quentin Angst, MD  traMADol (ULTRAM) 50 MG tablet Take 2 tablets (100 mg total) by mouth every 6 (six) hours as needed. Do not use >8 tabs/day. 06/16/14   Sherren Mocha, MD   BP 153/99 mmHg  Pulse 64  Temp(Src) 97.9 F (36.6 C) (Oral)  Resp 16  SpO2 99% Physical Exam  Constitutional: He appears well-developed and well-nourished. No distress.  HENT:  Head: Normocephalic and atraumatic.  Eyes: Conjunctivae and EOM are normal. Right eye exhibits no discharge. Left eye exhibits no discharge.  Cardiovascular: Normal rate and regular rhythm.   Pulmonary/Chest: Effort normal and breath sounds normal. No respiratory distress. He has no wheezes.  Abdominal: Soft. Bowel sounds are normal. He exhibits no distension. There is no tenderness.  Musculoskeletal:  No clavicular step-off or crepitus. No obvious deformity to left shoulder. Tenderness to left upper back in the distribution of trapezius, with muscle tightness. No midline tenderness to cervical, lumbar, or thoracic spine. FROM, without significant tenderness. 5/5 strength in bilateral upper extremities. 2+ radial pulses, equal bilaterally. FROM of left upper extremity, without significant discomfort. No swelling or erythema. No wounds. Patient with well-healing, 1 cm, scar to left upper back that is not open. It is clean, dry, and intact.   Neurological: He is alert. He exhibits normal muscle tone. Coordination normal.  Skin: Skin is warm and dry. He is not diaphoretic.  Nursing note and vitals reviewed.   ED Course  Procedures (including critical care time)  DIAGNOSTIC STUDIES: Oxygen Saturation is 98% on RA, normal by my interpretation.    COORDINATION OF CARE: 2:11 PM - Suspect muscle spasm. Discussed treatment plan with pt at bedside, and pt agreed to plan. Patient has someone to drive him home.   Imaging Review Dg Shoulder Left  08/07/2014   CLINICAL DATA:  Box fell on left  shoulder last evening.  EXAM: LEFT SHOULDER - 2+ VIEW  COMPARISON:  10/29/2012  FINDINGS: Mild AC joint degenerative changes. No acute bony findings or abnormal soft tissue calcifications. The visualized left lung is clear.  IMPRESSION: No acute bony findings.   Electronically Signed   By: Rudie Meyer M.D.   On: 08/07/2014 14:02    MDM   Final diagnoses:  Injury of left shoulder, initial encounter   Patient X-Ray negative for obvious fracture or dislocation. Pain managed in ED with valium. reviewed notes and pt with polysubstance abuse and no controlled substance scripts unless pt has broken bone per primary provider. Pt without erythema, edema or warmth to joint. Neurovascularly intact. I doubt septic arthritis. Pt advised to follow up with PCP/orthopedics if symptoms persist.  RICE, ibuprofen and conservative therapy recommended and discussed.   Discussed return precautions with patient. Discussed all results and patient verbalizes understanding and agrees with plan.  I personally performed the services described in this documentation, which was scribed in my presence. The recorded information has been reviewed and is accurate.   Oswaldo ConroyVictoria Melina Mosteller, PA-C 08/07/14 1454  Lorre NickAnthony Allen, MD 08/09/14 [phone]

## 2014-08-07 NOTE — ED Notes (Signed)
Pt reports to the ED for eval of upper back and left shoulder pain. He reports that last night he was getting something out of his closet when a box fell and landed on him. The box did not strike his head and he did not lose consciousness. CMS intact. He is able to move his arm however it causes increased pain. Denies any numbness, tingling, paralysis, bowel or bladder incontinence. Pt A&Ox4, resp e/u, and skin warm and dry.

## 2014-08-07 NOTE — Discharge Instructions (Signed)
Return to the emergency room with worsening of symptoms, new symptoms or with symptoms that are concerning ,especially fevers, redness, swelling, numbness, tingling, weakness. RICE: Rest, Ice (three cycles of 20 mins on, off at least twice a day), compression/brace, elevation. Heating pad works well for back pain. Ibuprofen  (2 tablets ) every 5-6 hours for 3-5 days. Follow up with orthopedist for persistent symptoms in 1-2 weeks. Read below information and follow recommendations. Shoulder Exercises EXERCISES  RANGE OF MOTION (ROM) AND STRETCHING EXERCISES These exercises may help you when beginning to rehabilitate your injury. Your symptoms may resolve with or without further involvement from your physician, physical therapist or athletic trainer. While completing these exercises, remember:   Restoring tissue flexibility helps normal motion to return to the joints. This allows healthier, less painful movement and activity.  An effective stretch should be held for at least 30 seconds.  A stretch should never be painful. You should only feel a gentle lengthening or release in the stretched tissue. ROM - Pendulum  Bend at the waist so that your right / left arm falls away from your body. Support yourself with your opposite hand on a solid surface, such as a table or a countertop.  Your right / left arm should be perpendicular to the ground. If it is not perpendicular, you need to lean over farther. Relax the muscles in your right / left arm and shoulder as much as possible.  Gently sway your hips and trunk so they move your right / left arm without any use of your right / left shoulder muscles.  Progress your movements so that your right / left arm moves side to side, then forward and backward, and finally, both clockwise and counterclockwise.  Complete __________ repetitions in each direction. Many people use this exercise to relieve discomfort in their shoulder as well as to  gain range of motion. Repeat __________ times. Complete this exercise __________ times per day. STRETCH - Flexion, Standing  Stand with good posture. With an underhand grip on your right / left hand and an overhand grip on the opposite hand, grasp a broomstick or cane so that your hands are a little more than shoulder-width apart.  Keeping your right / left elbow straight and shoulder muscles relaxed, push the stick with your opposite hand to raise your right / left arm in front of your body and then overhead. Raise your arm until you feel a stretch in your right / left shoulder, but before you have increased shoulder pain.  Try to avoid shrugging your right / left shoulder as your arm rises by keeping your shoulder blade tucked down and toward your mid-back spine. Hold __________ seconds.  Slowly return to the starting position. Repeat __________ times. Complete this exercise __________ times per day. STRETCH - Internal Rotation  Place your right / left hand behind your back, palm-up.  Throw a towel or belt over your opposite shoulder. Grasp the towel/belt with your right / left hand.  While keeping an upright posture, gently pull up on the towel/belt until you feel a stretch in the front of your right / left shoulder.  Avoid shrugging your right / left shoulder as your arm rises by keeping your shoulder blade tucked down and toward your mid-back spine.  Hold __________. Release the stretch by lowering your opposite hand. Repeat __________ times. Complete this exercise __________ times per day. STRETCH - External Rotation and Abduction  Stagger your stance through a doorframe. It does not  matter which foot is forward.  As instructed by your physician, physical therapist or athletic trainer, place your hands:  And forearms above your head and on the door frame.  And forearms at head-height and on the door frame.  At elbow-height and on the door frame.  Keeping your head and chest  upright and your stomach muscles tight to prevent over-extending your low-back, slowly shift your weight onto your front foot until you feel a stretch across your chest and/or in the front of your shoulders.  Hold __________ seconds. Shift your weight to your back foot to release the stretch. Repeat __________ times. Complete this stretch __________ times per day.  STRENGTHENING EXERCISES  These exercises may help you when beginning to rehabilitate your injury. They may resolve your symptoms with or without further involvement from your physician, physical therapist or athletic trainer. While completing these exercises, remember:   Muscles can gain both the endurance and the strength needed for everyday activities through controlled exercises.  Complete these exercises as instructed by your physician, physical therapist or athletic trainer. Progress the resistance and repetitions only as guided.  You may experience muscle soreness or fatigue, but the pain or discomfort you are trying to eliminate should never worsen during these exercises. If this pain does worsen, stop and make certain you are following the directions exactly. If the pain is still present after adjustments, discontinue the exercise until you can discuss the trouble with your clinician.  If advised by your physician, during your recovery, avoid activity or exercises which involve actions that place your right / left hand or elbow above your head or behind your back or head. These positions stress the tissues which are trying to heal. STRENGTH - Scapular Depression and Adduction  With good posture, sit on a firm chair. Supported your arms in front of you with pillows, arm rests or a table top. Have your elbows in line with the sides of your body.  Gently draw your shoulder blades down and toward your mid-back spine. Gradually increase the tension without tensing the muscles along the top of your shoulders and the back of your  neck.  Hold for __________ seconds. Slowly release the tension and relax your muscles completely before completing the next repetition.  After you have practiced this exercise, remove the arm support and complete it in standing as well as sitting. Repeat __________ times. Complete this exercise __________ times per day.  STRENGTH - External Rotators  Secure a rubber exercise band/tubing to a fixed object so that it is at the same height as your right / left elbow when you are standing or sitting on a firm surface.  Stand or sit so that the secured exercise band/tubing is at your side that is not injured.  Bend your elbow 90 degrees. Place a folded towel or small pillow under your right / left arm so that your elbow is a few inches away from your side.  Keeping the tension on the exercise band/tubing, pull it away from your body, as if pivoting on your elbow. Be sure to keep your body steady so that the movement is only coming from your shoulder rotating.  Hold __________ seconds. Release the tension in a controlled manner as you return to the starting position. Repeat __________ times. Complete this exercise __________ times per day.  STRENGTH - Supraspinatus  Stand or sit with good posture. Grasp a __________ weight or an exercise band/tubing so that your hand is "thumbs-up," like when you  shake hands.  Slowly lift your right / left hand from your thigh into the air, traveling about 30 degrees from straight out at your side. Lift your hand to shoulder height or as far as you can without increasing any shoulder pain. Initially, many people do not lift their hands above shoulder height.  Avoid shrugging your right / left shoulder as your arm rises by keeping your shoulder blade tucked down and toward your mid-back spine.  Hold for __________ seconds. Control the descent of your hand as you slowly return to your starting position. Repeat __________ times. Complete this exercise __________  times per day.  STRENGTH - Shoulder Extensors  Secure a rubber exercise band/tubing so that it is at the height of your shoulders when you are either standing or sitting on a firm arm-less chair.  With a thumbs-up grip, grasp an end of the band/tubing in each hand. Straighten your elbows and lift your hands straight in front of you at shoulder height. Step back away from the secured end of band/tubing until it becomes tense.  Squeezing your shoulder blades together, pull your hands down to the sides of your thighs. Do not allow your hands to go behind you.  Hold for __________ seconds. Slowly ease the tension on the band/tubing as you reverse the directions and return to the starting position. Repeat __________ times. Complete this exercise __________ times per day.  STRENGTH - Scapular Retractors  Secure a rubber exercise band/tubing so that it is at the height of your shoulders when you are either standing or sitting on a firm arm-less chair.  With a palm-down grip, grasp an end of the band/tubing in each hand. Straighten your elbows and lift your hands straight in front of you at shoulder height. Step back away from the secured end of band/tubing until it becomes tense.  Squeezing your shoulder blades together, draw your elbows back as you bend them. Keep your upper arm lifted away from your body throughout the exercise.  Hold __________ seconds. Slowly ease the tension on the band/tubing as you reverse the directions and return to the starting position. Repeat __________ times. Complete this exercise __________ times per day. STRENGTH - Scapular Depressors  Find a sturdy chair without wheels, such as a from a dining room table.  Keeping your feet on the floor, lift your bottom from the seat and lock your elbows.  Keeping your elbows straight, allow gravity to pull your body weight down. Your shoulders will rise toward your ears.  Raise your body against gravity by drawing your  shoulder blades down your back, shortening the distance between your shoulders and ears. Although your feet should always maintain contact with the floor, your feet should progressively support less body weight as you get stronger.  Hold __________ seconds. In a controlled and slow manner, lower your body weight to begin the next repetition. Repeat __________ times. Complete this exercise __________ times per day.  Document Released: 01/02/2005 Document Revised: 05/13/2011 Document Reviewed: 06/02/2008 [facility] Patient Information 2015 [employer] [place]. This information is not intended to replace advice given to you by your health care provider. Make sure you discuss any questions you have with your health care provider.

## 2014-08-22 ENCOUNTER — Other Ambulatory Visit: Payer: Self-pay | Admitting: Gastroenterology

## 2014-08-22 ENCOUNTER — Telehealth: Payer: Self-pay

## 2014-08-22 NOTE — Addendum Note (Signed)
Addended by: Kendrah Lovern on: 08/22/2014 11:51 AM   Modules accepted: Orders  

## 2014-08-22 NOTE — Telephone Encounter (Signed)
Pt states he is having severe back spasms,please advise   Best phone for pt is [phone]   Pharmacy [facility]

## 2014-08-22 NOTE — Telephone Encounter (Signed)
Dr. Clelia Croft it looks like pt is on Tramadol for chronic pain. Would there be any other medication he could take?

## 2014-08-22 NOTE — Telephone Encounter (Signed)
Would need to be seen.  I am not going to prescribe this pt any more tramadol or any other controlled medicine - to many times it has been stolen, lost, early refills, etc.  He is welcome to come in for an exam to see if there are other ways we can provide relief.

## 2014-08-23 ENCOUNTER — Encounter (HOSPITAL_COMMUNITY): Payer: Self-pay | Admitting: *Deleted

## 2014-08-23 NOTE — Telephone Encounter (Signed)
Spoke with pt, advised message from Dr. Shaw. Pt understood. 

## 2014-08-23 NOTE — Progress Notes (Signed)
   08/23/14 1940  OBSTRUCTIVE SLEEP APNEA  Have you ever been diagnosed with sleep apnea through a sleep study? No  Do you snore loudly (loud enough to be heard through closed doors)?  0  Do you often feel tired, fatigued, or sleepy during the daytime? 1  Has anyone observed you stop breathing during your sleep? 1  Do you have, or are you being treated for high blood pressure? 1  BMI more than 35 kg/m2? 1  Age over 47 years old? 0  Gender: 1   

## 2014-08-23 NOTE — Progress Notes (Signed)
   08/23/14 1940  OBSTRUCTIVE SLEEP APNEA  Have you ever been diagnosed with sleep apnea through a sleep study? No  Do you snore loudly (loud enough to be heard through closed doors)?  0  Do you often feel tired, fatigued, or sleepy during the daytime? 1  Has anyone observed you stop breathing during your sleep? 1  Do you have, or are you being treated for high blood pressure? 1  BMI more than 35 kg/m2? 1  Age over 47 years old? 0  Gender: 1

## 2014-08-23 NOTE — Telephone Encounter (Signed)
Left message for pt to call back  °

## 2014-08-24 ENCOUNTER — Ambulatory Visit (HOSPITAL_COMMUNITY): Payer: No Typology Code available for payment source | Admitting: Certified Registered"

## 2014-08-24 ENCOUNTER — Ambulatory Visit (HOSPITAL_COMMUNITY)
Admission: RE | Admit: 2014-08-24 | Discharge: 2014-08-24 | Disposition: A | Discharge status: Home / Self Care | Payer: Self-pay | Source: Ambulatory Visit | Attending: Gastroenterology | Admitting: Gastroenterology

## 2014-08-24 ENCOUNTER — Encounter (HOSPITAL_COMMUNITY)
Admission: RE | Disposition: A | Discharge status: Home / Self Care | Payer: Self-pay | Source: Ambulatory Visit | Attending: Gastroenterology

## 2014-08-24 ENCOUNTER — Encounter (HOSPITAL_COMMUNITY): Payer: Self-pay | Admitting: *Deleted

## 2014-08-24 ENCOUNTER — Ambulatory Visit (HOSPITAL_COMMUNITY): Payer: Self-pay | Admitting: Certified Registered"

## 2014-08-24 DIAGNOSIS — I10 Essential (primary) hypertension: Secondary | ICD-10-CM | POA: Insufficient documentation

## 2014-08-24 DIAGNOSIS — Z87891 Personal history of nicotine dependence: Secondary | ICD-10-CM | POA: Insufficient documentation

## 2014-08-24 DIAGNOSIS — R1310 Dysphagia, unspecified: Secondary | ICD-10-CM | POA: Insufficient documentation

## 2014-08-24 DIAGNOSIS — K2950 Unspecified chronic gastritis without bleeding: Secondary | ICD-10-CM | POA: Insufficient documentation

## 2014-08-24 HISTORY — DX: Post-traumatic stress disorder, unspecified: F43.10

## 2014-08-24 HISTORY — PX: ESOPHAGOGASTRODUODENOSCOPY (EGD) WITH PROPOFOL: SHX5813

## 2014-08-24 HISTORY — DX: Constipation, unspecified: K59.00

## 2014-08-24 HISTORY — PX: COLONOSCOPY: SHX5424

## 2014-08-24 HISTORY — DX: Unspecified urinary incontinence: R32

## 2014-08-24 HISTORY — DX: Reserved for inherently not codable concepts without codable children: IMO0001

## 2014-08-24 HISTORY — DX: Gastro-esophageal reflux disease without esophagitis: K21.9

## 2014-08-24 SURGERY — ESOPHAGOGASTRODUODENOSCOPY (EGD) WITH PROPOFOL
Anesthesia: General

## 2014-08-24 MED ORDER — MIDAZOLAM HCL 5 MG/5ML IJ SOLN
INTRAMUSCULAR | Status: DC | PRN
  Administered 2014-08-24 (×2): 1 mg via INTRAVENOUS

## 2014-08-24 MED ORDER — SODIUM CHLORIDE 0.9 % IV SOLN: INTRAVENOUS | Status: DC

## 2014-08-24 MED ORDER — SUCCINYLCHOLINE CHLORIDE 20 MG/ML IJ SOLN
INTRAMUSCULAR | Status: DC | PRN
  Administered 2014-08-24: 200 mg via INTRAVENOUS

## 2014-08-24 MED ORDER — PROPOFOL 10 MG/ML IV BOLUS
INTRAVENOUS | Status: DC | PRN
  Administered 2014-08-24: 200 mg via INTRAVENOUS

## 2014-08-24 MED ORDER — LACTATED RINGERS IV SOLN
INTRAVENOUS | Status: DC
  Administered 2014-08-24: 1000 mL via INTRAVENOUS

## 2014-08-24 MED ORDER — LIDOCAINE HCL (CARDIAC) 20 MG/ML IV SOLN
INTRAVENOUS | Status: DC | PRN
  Administered 2014-08-24: 60 mg via INTRAVENOUS

## 2014-08-24 MED ORDER — ONDANSETRON HCL 4 MG/2ML IJ SOLN
INTRAMUSCULAR | Status: DC | PRN
  Administered 2014-08-24: 4 mg via INTRAVENOUS

## 2014-08-24 MED ORDER — FENTANYL CITRATE (PF) 100 MCG/2ML IJ SOLN
INTRAMUSCULAR | Status: DC | PRN
  Administered 2014-08-24: 50 ug via INTRAVENOUS

## 2014-08-24 NOTE — Interval H&P Note (Signed)
History and Physical Interval Note:  08/24/2014 9:09 AM  Travis [name]  has presented today for surgery, with the diagnosis of rectal bleeding,  abdn pain   The various methods of treatment have been discussed with the patient and family. After consideration of risks, benefits and other options for treatment, the patient has consented to  Procedure(s): ESOPHAGOGASTRODUODENOSCOPY (EGD) WITH PROPOFOL (N/A) COLONOSCOPY (N/A) as a surgical intervention .  The patient's history has been reviewed, patient examined, no change in status, stable for surgery.  I have reviewed the patient's chart and labs.  Questions were answered to the patient's satisfaction.     Lu Paradise C.

## 2014-08-24 NOTE — Anesthesia Procedure Notes (Signed)
Procedure Name: Intubation Date/Time: 08/24/2014 9:34 AM Performed by: De Nurse Pre-anesthesia Checklist: Patient identified, Emergency Drugs available, Suction available, Patient being monitored and Timeout performed Patient Re-evaluated:Patient Re-evaluated prior to inductionOxygen Delivery Method: Circle system utilized Preoxygenation: Pre-oxygenation with 100% oxygen Intubation Type: Rapid sequence Laryngoscope Size: Mac and 3 Grade View: Grade I Tube type: Oral Tube size: 7.5 mm Number of attempts: 1 Airway Equipment and Method: Stylet Placement Confirmation: ETT inserted through vocal cords under direct vision,  positive ETCO2 and breath sounds checked- equal and bilateral Secured at: 22 cm Tube secured with: Tape Dental Injury: Teeth and Oropharynx as per pre-operative assessment

## 2014-08-24 NOTE — Anesthesia Postprocedure Evaluation (Signed)
  Anesthesia Post-op Note  Patient: Travis [name]  Procedure(s) Performed: Procedure(s): ESOPHAGOGASTRODUODENOSCOPY (EGD) WITH PROPOFOL (N/A) COLONOSCOPY (N/A)  Patient Location: PACU  Anesthesia Type:General  Level of Consciousness: awake, alert  and oriented  Airway and Oxygen Therapy: Patient Spontanous Breathing and Patient connected to nasal cannula oxygen  Post-op Pain: mild  Post-op Assessment: Post-op Vital signs reviewed, Patient's Cardiovascular Status Stable, Respiratory Function Stable, Patent Airway and Pain level controlled              Post-op Vital Signs: stable  Last Vitals:  Filed Vitals:   08/24/14 1035  BP:   Pulse:   Temp: 36.7 C  Resp:     Complications: No apparent anesthesia complications

## 2014-08-24 NOTE — Transfer of Care (Signed)
Immediate Anesthesia Transfer of Care Note  Patient: Travis [name]  Procedure(s) Performed: Procedure(s): ESOPHAGOGASTRODUODENOSCOPY (EGD) WITH PROPOFOL (N/A) COLONOSCOPY (N/A)  Patient Location: PACU  Anesthesia Type:General  Level of Consciousness: awake  Airway & Oxygen Therapy: Patient Spontanous Breathing and Patient connected to nasal cannula oxygen  Post-op Assessment: Report given to RN  Post vital signs: Reviewed and stable  Last Vitals:  Filed Vitals:   08/24/14 0755  BP: 151/103  Pulse: 62  Temp: 36.9 C  Resp: 14    Complications: No apparent anesthesia complications

## 2014-08-24 NOTE — H&P (Signed)
  Date of Initial H&P: 08/17/14  History reviewed, patient examined, no change in status, stable for surgery. 

## 2014-08-24 NOTE — Op Note (Signed)
[facility] [address] [name], [address]   COLONOSCOPY PROCEDURE REPORT     EXAM DATE: Sep 17, 2014  PATIENT NAME:      Travis [name], Travis [name]           MR #:      [id number] BIRTHDATE:       August 03, 1965      VISIT #:     [id number]  ATTENDING:     Charlott Rakes, MD     STATUS:     outpatient REFERRING MD: ASA CLASS:        Class III  INDICATIONS:  The patient is a 49 yr old male here for a colonoscopy due to hematochezia. PROCEDURE PERFORMED:     Colonoscopy, diagnostic MEDICATIONS:     Monitored anesthesia care and Per Anesthesia  ESTIMATED BLOOD LOSS:     None  CONSENT: The patient understands the risks and benefits of the procedure and understands that these risks include, but are not limited to: sedation, allergic reaction, infection, perforation and/or bleeding. Alternative means of evaluation and treatment include, among others: physical exam, x-rays, and/or surgical intervention. The patient elects to proceed with this endoscopic procedure.  DESCRIPTION OF PROCEDURE: During intra-op preparation period all mechanical & medical equipment was checked for proper function. Hand hygiene and appropriate measures for infection prevention was taken. After the risks, benefits and alternatives of the procedure were thoroughly explained, Informed consent was verified, confirmed and timeout was successfully executed by the treatment team. A digital exam revealed no abnormalities of the rectum.      The Pentax Ped Colon [id number] endoscope was introduced through the anus and advanced to the cecum, which was identified by both the appendix and ileocecal valve. The prep was inadequate. The instrument was then slowly withdrawn as the colon was fully examined. Estimated blood loss is zero unless otherwise noted in this procedure report. Scattered sigmoid diverticuli seen. Semi-solid and semi-liquid stool made visualization difficult.      Retroflexed views  revealed internal hemorrhoids.  The scope was then completely withdrawn from the patient and the procedure terminated.     ADVERSE EVENTS:      There were no immediate complications.   IMPRESSIONS:     Internal hemorrhoids; Sigmoid diverticulosis; Previous bleeding likely from hemorrhoids  RECOMMENDATIONS:     Analpram prn   Charlott Rakes, MD eSigned:  Charlott Rakes, MD 2014-09-17 11:57 AM   cc:  CPT CODES: ICD CODES:  The ICD and CPT codes recommended by this software are interpretations from the data that the clinical staff has captured with the software.  The verification of the translation of this report to the ICD and CPT codes and modifiers is the sole responsibility of the health care institution and practicing physician where this report was generated.  [employer] will not be held responsible for the validity of the ICD and CPT codes included on this report.  AMA assumes no liability for data contained or not contained herein. CPT is a Publishing rights manager of the [employer].

## 2014-08-24 NOTE — Op Note (Signed)
[facility] [address] [name], [address]   ENDOSCOPY PROCEDURE REPORT  PATIENT: [name], Travis  MR#: [id number] BIRTHDATE: 1965-11-01 , 49  yrs. old GENDER: male ENDOSCOPIST: Charlott Rakes, MD REFERRED BY: PROCEDURE DATE:  09/12/2014 PROCEDURE:  EGD w/ biopsy ASA CLASS:     Class III INDICATIONS:  hematochezia and dysphagia. MEDICATIONS: Monitored anesthesia care and Per Anesthesia TOPICAL ANESTHETIC:  DESCRIPTION OF PROCEDURE: After the risks benefits and alternatives of the procedure were thoroughly explained, informed consent was obtained.  The Pentax Gastroscope [id number] endoscope was introduced through the mouth and advanced to the second portion of the duodenum , Without limitations.  The instrument was slowly withdrawn as the mucosa was fully examined. Estimated blood loss is zero unless otherwise noted in this procedure report.    Segmental subepithelial hemmorrhages in the proximal and mid-stomach consistent with hemorrhagic gastritis. Mosaic pattern seen in proximal stomach. Biopsy taken of the body of the stomach. Duodenal bulb and 2nd portion of the duodenum  normal.       Retroflexed views revealed gastritis in proximal stomach.     The scope was then withdrawn from the patient and the procedure completed.  COMPLICATIONS: There were no immediate complications.  ENDOSCOPIC IMPRESSION:     Hemorrhagic gastritis - s/p biopsy   RECOMMENDATIONS:     F/U on path   eSigned:  Charlott Rakes, MD 2014-09-12 11:53 AM    CC:  CPT CODES: ICD CODES:  The ICD and CPT codes recommended by this software are interpretations from the data that the clinical staff has captured with the software.  The verification of the translation of this report to the ICD and CPT codes and modifiers is the sole responsibility of the health care institution and practicing physician where this report was generated.  [employer] will  not be held responsible for the validity of the ICD and CPT codes included on this report.  AMA assumes no liability for data contained or not contained herein. CPT is a Publishing rights manager of the [employer].

## 2014-08-24 NOTE — Discharge Instructions (Addendum)
Will call you when biopsy results are complete. Analpram or equivalent medicine will be called in for you by the office to use at your rectum as needed.General Anesthesia General anesthesia is a sleep-like state of non-feeling produced by medicines (anesthetics). General anesthesia prevents you from being alert and feeling pain during a medical procedure. Your caregiver may recommend general anesthesia if your procedure:  Is long.  Is painful or uncomfortable.  Would be frightening to see or hear.  Requires you to be still.  Affects your breathing.  Causes significant blood loss. LET YOUR CAREGIVER KNOW ABOUT:  Allergies to food or medicine.  Medicines taken, including vitamins, herbs, eyedrops, over-the-counter medicines, and creams.  Use of steroids (by mouth or creams).  Previous problems with anesthetics or numbing medicines, including problems experienced by relatives.  History of bleeding problems or blood clots.  Previous surgeries and types of anesthetics received.  Possibility of pregnancy, if this applies.  Use of cigarettes, alcohol, or illegal drugs.  Any health condition(s), especially diabetes, sleep apnea, and high blood pressure. RISKS AND COMPLICATIONS General anesthesia rarely causes complications. However, if complications do occur, they can be life threatening. Complications include:  A lung infection.  A stroke.  A heart attack.  Waking up during the procedure. When this occurs, the patient may be unable to move and communicate that he or she is awake. The patient may feel severe pain. Older adults and adults with serious medical problems are more likely to have complications than adults who are young and healthy. Some complications can be prevented by answering all of your caregiver's questions thoroughly and by following all pre-procedure instructions. It is important to tell your caregiver if any of the pre-procedure instructions, especially those  related to diet, were not followed. Any food or liquid in the stomach can cause problems when you are under general anesthesia. BEFORE THE PROCEDURE  Ask your caregiver if you will have to spend the night at the hospital. If you will not have to spend the night, arrange to have an adult drive you and stay with you for 24 hours.  Follow your caregiver's instructions if you are taking dietary supplements or medicines. Your caregiver may tell you to stop taking them or to reduce your dosage.  Do not smoke for as long as possible before your procedure. If possible, stop smoking 3-6 weeks before the procedure.  Do not take new dietary supplements or medicines within 1 week of your procedure unless your caregiver approves them.  Do not eat within 8 hours of your procedure or as directed by your caregiver. Drink only clear liquids, such as water, black coffee (without milk or cream), and fruit juices (without pulp).  Do not drink within 3 hours of your procedure or as directed by your caregiver.  You may brush your teeth on the morning of the procedure, but make sure to spit out the toothpaste and water when finished. PROCEDURE  You will receive anesthetics through a mask, through an intravenous (IV) access tube, or through both. A doctor who specializes in anesthesia (anesthesiologist) or a nurse who specializes in anesthesia (nurse anesthetist) or both will stay with you throughout the procedure to make sure you remain unconscious. He or she will also watch your blood pressure, pulse, and oxygen levels to make sure that the anesthetics do not cause any problems. Once you are asleep, a breathing tube or mask may be used to help you breathe. AFTER THE PROCEDURE You will wake up after  the procedure is complete. You may be in the room where the procedure was performed or in a recovery area. You may have a sore throat if a breathing tube was used. You may also  feel:  Dizzy.  Weak.  Drowsy.  Confused.  Nauseous.  Cold. These are all normal responses and can be expected to last for up to 24 hours after the procedure is complete. A caregiver will tell you when you are ready to go home. This will usually be when you are fully awake and in stable condition. Document Released: 05/28/2007 Document Revised: 07/05/2013 Document Reviewed: 06/19/2011 [facility] Patient Information 2015 [place], [place]. This information is not intended to replace advice given to you by your health care provider. Make sure you discuss any questions you have with your health care provider. Colonoscopy, Care After Refer to this sheet in the next few weeks. These instructions provide you with information on caring for yourself after your procedure. Your health care provider may also give you more specific instructions. Your treatment has been planned according to current medical practices, but problems sometimes occur. Call your health care provider if you have any problems or questions after your procedure. WHAT TO EXPECT AFTER THE PROCEDURE  After your procedure, it is typical to have the following:  A small amount of blood in your stool.  Moderate amounts of gas and mild abdominal cramping or bloating. HOME CARE INSTRUCTIONS  Do not drive, operate machinery, or sign important documents for 24 hours.  You may shower and resume your regular physical activities, but move at a slower pace for the first 24 hours.  Take frequent rest periods for the first 24 hours.  Walk around or put a warm pack on your abdomen to help reduce abdominal cramping and bloating.  Drink enough fluids to keep your urine clear or pale yellow.  You may resume your normal diet as instructed by your health care provider. Avoid heavy or fried foods that are hard to digest.  Avoid drinking alcohol for 24 hours or as instructed by your health care provider.  Only take over-the-counter or  prescription medicines as directed by your health care provider.  If a tissue sample (biopsy) was taken during your procedure:  Do not take aspirin or blood thinners for 7 days, or as instructed by your health care provider.  Do not drink alcohol for 7 days, or as instructed by your health care provider.  Eat soft foods for the first 24 hours. SEEK MEDICAL CARE IF: You have persistent spotting of blood in your stool 2-3 days after the procedure. SEEK IMMEDIATE MEDICAL CARE IF:  You have more than a small spotting of blood in your stool.  You pass large blood clots in your stool.  Your abdomen is swollen (distended).  You have nausea or vomiting.  You have a fever.  You have increasing abdominal pain that is not relieved with medicine. Document Released: 10/03/2003 Document Revised: 12/09/2012 Document Reviewed: 10/26/2012 [facility] Patient Information 2015 [place], [place]. This information is not intended to replace advice given to you by your health care provider. Make sure you discuss any questions you have with your health care provider. Esophagogastroduodenoscopy Care After Refer to this sheet in the next few weeks. These instructions provide you with information on caring for yourself after your procedure. Your caregiver may also give you more specific instructions. Your treatment has been planned according to current medical practices, but problems sometimes occur. Call your caregiver if you have any  problems or questions after your procedure.  HOME CARE INSTRUCTIONS  Do not eat or drink anything until the numbing medicine (local anesthetic) has worn off and your gag reflex has returned. You will know that the local anesthetic has worn off when you can swallow comfortably.  Do not drive for 12 hours after the procedure or as directed by your caregiver.  Only take medicines as directed by your caregiver. SEEK MEDICAL CARE IF:   You cannot stop coughing.  You are not  urinating at all or less than usual. SEEK IMMEDIATE MEDICAL CARE IF:  You have difficulty swallowing.  You cannot eat or drink.  You have worsening throat or chest pain.  You have dizziness, lightheadedness, or you faint.  You have nausea or vomiting.  You have chills.  You have a fever.  You have severe abdominal pain.  You have black, tarry, or bloody stools. Document Released: 02/05/2012 Document Reviewed: 02/05/2012 [facility] Patient Information 2015 [place], [place]. This information is not intended to replace advice given to you by your health care provider. Make sure you discuss any questions you have with your health care provider.

## 2014-08-24 NOTE — Anesthesia Preprocedure Evaluation (Signed)
Anesthesia Evaluation  Patient identified by MRN, date of birth, ID band Patient awake    Reviewed: Allergy & Precautions, NPO status , Patient's Chart, lab work & pertinent test results  Airway Mallampati: III  TM Distance: >3 FB Neck ROM: Limited    Dental  (+) Edentulous Upper, Edentulous Lower   Pulmonary former smoker,  breath sounds clear to auscultation        Cardiovascular hypertension, Rhythm:Regular     Neuro/Psych    GI/Hepatic   Endo/Other    Renal/GU      Musculoskeletal   Abdominal (+) + obese,   Peds  Hematology   Anesthesia Other Findings   Reproductive/Obstetrics                             Anesthesia Physical Anesthesia Plan  ASA: III  Anesthesia Plan: General   Post-op Pain Management:    Induction: Intravenous  Airway Management Planned: Oral ETT  Additional Equipment:   Intra-op Plan:   Post-operative Plan: Extubation in OR  Informed Consent: I have reviewed the patients History and Physical, chart, labs and discussed the procedure including the risks, benefits and alternatives for the proposed anesthesia with the patient or authorized representative who has indicated his/her understanding and acceptance.     Plan Discussed with: CRNA and Anesthesiologist  Anesthesia Plan Comments: (Htn Spinal stenosis Hypertension  Plan GA with oral ETT  Kipp Brood)        Anesthesia Quick Evaluation

## 2014-08-24 NOTE — Telephone Encounter (Signed)
Pt states he went to have colonscopy and it was negative,but also hand endoscopy and they did find something which they biopsied, Wanted to let  Provider know he would be in to see her in a few days.   Best phone for pt is [phone]

## 2014-08-25 ENCOUNTER — Telehealth: Payer: Self-pay

## 2014-08-25 ENCOUNTER — Emergency Department (HOSPITAL_COMMUNITY)
Admission: EM | Admit: 2014-08-25 | Discharge: 2014-08-25 | Disposition: A | Discharge status: Home / Self Care | Payer: Self-pay | Attending: Emergency Medicine | Admitting: Emergency Medicine

## 2014-08-25 ENCOUNTER — Encounter (HOSPITAL_COMMUNITY): Payer: Self-pay | Admitting: Gastroenterology

## 2014-08-25 ENCOUNTER — Emergency Department (HOSPITAL_COMMUNITY): Payer: Self-pay

## 2014-08-25 ENCOUNTER — Emergency Department (HOSPITAL_COMMUNITY): Payer: No Typology Code available for payment source

## 2014-08-25 DIAGNOSIS — R109 Unspecified abdominal pain: Secondary | ICD-10-CM

## 2014-08-25 DIAGNOSIS — R1084 Generalized abdominal pain: Secondary | ICD-10-CM | POA: Insufficient documentation

## 2014-08-25 DIAGNOSIS — Z8669 Personal history of other diseases of the nervous system and sense organs: Secondary | ICD-10-CM | POA: Insufficient documentation

## 2014-08-25 DIAGNOSIS — Z880 Allergy status to penicillin: Secondary | ICD-10-CM | POA: Insufficient documentation

## 2014-08-25 DIAGNOSIS — Z8739 Personal history of other diseases of the musculoskeletal system and connective tissue: Secondary | ICD-10-CM | POA: Insufficient documentation

## 2014-08-25 DIAGNOSIS — F329 Major depressive disorder, single episode, unspecified: Secondary | ICD-10-CM | POA: Insufficient documentation

## 2014-08-25 DIAGNOSIS — E669 Obesity, unspecified: Secondary | ICD-10-CM | POA: Insufficient documentation

## 2014-08-25 DIAGNOSIS — R739 Hyperglycemia, unspecified: Secondary | ICD-10-CM | POA: Insufficient documentation

## 2014-08-25 DIAGNOSIS — K219 Gastro-esophageal reflux disease without esophagitis: Secondary | ICD-10-CM | POA: Insufficient documentation

## 2014-08-25 DIAGNOSIS — Z87891 Personal history of nicotine dependence: Secondary | ICD-10-CM | POA: Insufficient documentation

## 2014-08-25 DIAGNOSIS — I10 Essential (primary) hypertension: Secondary | ICD-10-CM | POA: Insufficient documentation

## 2014-08-25 DIAGNOSIS — F419 Anxiety disorder, unspecified: Secondary | ICD-10-CM | POA: Insufficient documentation

## 2014-08-25 DIAGNOSIS — Z79899 Other long term (current) drug therapy: Secondary | ICD-10-CM | POA: Insufficient documentation

## 2014-08-25 LAB — CBC WITH DIFFERENTIAL/PLATELET
Basophils Absolute: 0 10*3/uL (ref 0.0–0.1)
Basophils Relative: 1 % (ref 0–1)
EOS PCT: 4 % (ref 0–5)
Eosinophils Absolute: 0.3 10*3/uL (ref 0.0–0.7)
HCT: 44.1 % (ref 39.0–52.0)
Hemoglobin: 14.5 g/dL (ref 13.0–17.0)
Lymphocytes Relative: 43 % (ref 12–46)
Lymphs Abs: 2.9 10*3/uL (ref 0.7–4.0)
MCH: 28.4 pg (ref 26.0–34.0)
MCHC: 32.9 g/dL (ref 30.0–36.0)
MCV: 86.3 fL (ref 78.0–100.0)
MONOS PCT: 9 % (ref 3–12)
Monocytes Absolute: 0.6 10*3/uL (ref 0.1–1.0)
NEUTROS ABS: 2.9 10*3/uL (ref 1.7–7.7)
Neutrophils Relative %: 43 % (ref 43–77)
Platelets: 161 10*3/uL (ref 150–400)
RBC: 5.11 MIL/uL (ref 4.22–5.81)
RDW: 13 % (ref 11.5–15.5)
WBC: 6.6 10*3/uL (ref 4.0–10.5)

## 2014-08-25 LAB — I-STAT CHEM 8, ED
BUN: 5 mg/dL — ABNORMAL LOW (ref 6–20)
Calcium, Ion: 1.11 mmol/L — ABNORMAL LOW (ref 1.12–1.23)
Chloride: 98 mmol/L — ABNORMAL LOW (ref 101–111)
Creatinine, Ser: 1 mg/dL (ref 0.61–1.24)
GLUCOSE: 205 mg/dL — AB (ref 65–99)
HCT: 46 % (ref 39.0–52.0)
HEMOGLOBIN: 15.6 g/dL (ref 13.0–17.0)
POTASSIUM: 3.5 mmol/L (ref 3.5–5.1)
Sodium: 137 mmol/L (ref 135–145)
TCO2: 27 mmol/L (ref 0–100)

## 2014-08-25 MED ORDER — IOHEXOL 300 MG/ML  SOLN
100.0000 mL | Freq: Once | INTRAMUSCULAR | Status: AC | PRN
Start: 2014-08-25 — End: 2014-08-25
  Administered 2014-08-25: 100 mL via INTRAVENOUS

## 2014-08-25 MED ORDER — ONDANSETRON HCL 4 MG/2ML IJ SOLN
4.0000 mg | Freq: Once | INTRAMUSCULAR | Status: AC
  Administered 2014-08-25: 4 mg via INTRAVENOUS
  Filled 2014-08-25: qty 2

## 2014-08-25 MED ORDER — SODIUM CHLORIDE 0.9 % IV BOLUS (SEPSIS)
1000.0000 mL | Freq: Once | INTRAVENOUS | Status: AC
Start: 2014-08-25 — End: 2014-08-25
  Administered 2014-08-25: 1000 mL via INTRAVENOUS

## 2014-08-25 MED ORDER — MORPHINE SULFATE 4 MG/ML IJ SOLN
6.0000 mg | Freq: Once | INTRAMUSCULAR | Status: AC
  Administered 2014-08-25: 6 mg via INTRAVENOUS
  Filled 2014-08-25: qty 2

## 2014-08-25 MED ORDER — IOHEXOL 300 MG/ML  SOLN
50.0000 mL | Freq: Once | INTRAMUSCULAR | Status: AC | PRN
  Administered 2014-08-25: 50 mL via ORAL

## 2014-08-25 NOTE — Discharge Instructions (Signed)
Follow up with your doctor for further management of your condition.   Abdominal Pain Many things can cause abdominal pain. Usually, abdominal pain is not caused by a disease and will improve without treatment. It can often be observed and treated at home. Your health care provider will do a physical exam and possibly order blood tests and X-rays to help determine the seriousness of your pain. However, in many cases, more time must pass before a clear cause of the pain can be found. Before that point, your health care provider may not know if you need more testing or further treatment. HOME CARE INSTRUCTIONS  Monitor your abdominal pain for any changes. The following actions may help to alleviate any discomfort you are experiencing:  Only take over-the-counter or prescription medicines as directed by your health care provider.  Do not take laxatives unless directed to do so by your health care provider.  Try a clear liquid diet (broth, tea, or water) as directed by your health care provider. Slowly move to a bland diet as tolerated. SEEK MEDICAL CARE IF:  You have unexplained abdominal pain.  You have abdominal pain associated with nausea or diarrhea.  You have pain when you urinate or have a bowel movement.  You experience abdominal pain that wakes you in the night.  You have abdominal pain that is worsened or improved by eating food.  You have abdominal pain that is worsened with eating fatty foods.  You have a fever. SEEK IMMEDIATE MEDICAL CARE IF:   Your pain does not go away within 2 hours.  You keep throwing up (vomiting).  Your pain is felt only in portions of the abdomen, such as the right side or the left lower portion of the abdomen.  You pass bloody or black tarry stools. MAKE SURE YOU:  Understand these instructions.   Will watch your condition.   Will get help right away if you are not doing well or get worse.  Document Released: 11/28/2004 Document Revised:  02/23/2013 Document Reviewed: 10/28/2012 [facility] Patient Information 2015 [place], [place]. This information is not intended to replace advice given to you by your health care provider. Make sure you discuss any questions you have with your health care provider.

## 2014-08-25 NOTE — ED Notes (Signed)
Attending at bedside.

## 2014-08-25 NOTE — Telephone Encounter (Signed)
Patient states every since he had has colonoscopy yesterday he has been in a lot of pain. He states he is having trouble walking and lifting his legs. Patient currently has no insurance and not working. He is requesting if possible dr Clelia Croft could prescribe him a pain medication. Patients call back number [phone]

## 2014-08-25 NOTE — ED Notes (Signed)
Per EMS: Pt had endoscopy and colonoscopy yesterday.  States that during the night, he began having full body pain that is worse in abd and chest.  No NVD.  No blood.  EMS noted blood on the waistband of his shorts.

## 2014-08-25 NOTE — Telephone Encounter (Signed)
He needs to ask the GI doctor for this as all of this could affect his stomach so they can advise depending upon their findings.

## 2014-08-25 NOTE — ED Provider Notes (Signed)
CSN: [id number]     Arrival date & time 08/25/14  4098 History   First MD Initiated Contact with Patient 08/25/14 [id number]     Chief Complaint  Patient presents with  . Abdominal Pain     (Consider location/radiation/quality/duration/timing/severity/associated sxs/prior Treatment) HPI   49 year old obese male with history of polysubstance abuse who recently developed rectal bleeding and had a endoscopy and colonoscopy done yesterday by Dr. Bosie Clos brought here via EMS for evaluation of body pain. Patient had been having intermittent rectal bleeding for the past year. He had a colonoscopy and endoscopy performed yesterday. He went home and slept after the procedure and an hour later he notice generalized body aches, along with abdominal discomfort. Pain is sharp, persistent, worsening with movement. No associated fever, chills, nausea vomiting diarrhea, chest pain, shortness of breath, back pain. He is able to pass flatus. He did contact the on-call doctor last night and was recommended to come to the ER however he has to take care of his ailing mother and decided to come to ER today.    Past Medical History  Diagnosis Date  . Hypertension   . Tachycardia - pulse   . Degenerative disk disease   . Spinal stenosis   . Degenerative disk disease   . Tobacco abuse   . Obesity   . Polysubstance abuse   . Chest pain     Hospital, March, 2014, negative enzymes, patient refused in-hospital  stress test, patient canceled outpatient stress test  . Spinal stenosis   . Depression   . Neuromuscular disorder   . Anxiety     Panic attacks  . Shortness of breath dyspnea     with exertion  . PTSD (post-traumatic stress disorder)   . Incontinence of urine   . GERD (gastroesophageal reflux disease)   . Constipation    Past Surgical History  Procedure Laterality Date  . Wisdom tooth extraction    . Esophagogastroduodenoscopy (egd) with propofol N/A 08/24/2014    Procedure: ESOPHAGOGASTRODUODENOSCOPY  (EGD) WITH PROPOFOL;  Surgeon: Charlott Rakes, MD;  Location: [facility] ENDOSCOPY;  Service: Endoscopy;  Laterality: N/A;  . Colonoscopy N/A 08/24/2014    Procedure: COLONOSCOPY;  Surgeon: Charlott Rakes, MD;  Location: [facility] ENDOSCOPY;  Service: Endoscopy;  Laterality: N/A;   Family History  Problem Relation Age of Onset  . Stroke Mother   . Hypertension Mother   . Hyperlipidemia Mother   . Stroke Father   . Hypertension Father   . Dementia Father   . Diabetes Father   . Heart disease Father   . Hyperlipidemia Father   . Cancer Sister     brain  . Diabetes Sister   . Heart disease Sister   . Hyperlipidemia Sister   . Hypertension Sister   . Stroke Sister   . Heart disease Brother   . Hyperlipidemia Brother    History  Substance Use Topics  . Smoking status: Former Smoker -- 0.02 packs/day for 27 years    Types: Cigarettes  . Smokeless tobacco: Never Used     Comment: "quit April 2016, I havwe one every now and then"  . Alcohol Use: No    Review of Systems  All other systems reviewed and are negative.     Allergies  Blueberry flavor; Penicillins; Robaxin; Flexeril; and Toradol  Home Medications   Prior to Admission medications   Medication Sig Start Date End Date Taking? Authorizing Provider  cloNIDine (CATAPRES) 0.1 MG tablet TK 1 T PO BID 07/29/14  Historical Provider, MD  cloNIDine (CATAPRES) 0.3 MG tablet TAKE ONE TABLET BY MOUTH TWICE DAILY 07/22/14   Quentin Angst, MD  Cyanocobalamin (VITAMIN B-12 PO) Take 1 tablet by mouth every evening.    Historical Provider, MD  diazepam (VALIUM) 10 MG tablet Take 10 mg by mouth every 6 (six) hours as needed (back spasms).     Historical Provider, MD  diphenhydrAMINE (BENADRYL) 25 MG tablet Take 25 mg by mouth every 6 (six) hours as needed for allergies.     Historical Provider, MD  lisinopril (PRINIVIL,ZESTRIL) 20 MG tablet Take 1 tablet (20 mg total) by mouth every morning. For high blood pressure 05/16/14   Thao P Le, DO   magnesium hydroxide (MILK OF MAGNESIA) 400 MG/5ML suspension Take 30 mLs by mouth daily as needed for mild constipation.    Historical Provider, MD  metoprolol (LOPRESSOR) 50 MG tablet Take 1 tablet (50 mg total) by mouth 2 (two) times daily. For high blood pressure 05/16/14   Thao P Le, DO  ranitidine (ZANTAC) 75 MG tablet Take 1 tablet (75 mg total) by mouth daily as needed for heartburn (heartburn). Patient taking differently: Take 75 mg by mouth daily as needed for heartburn.  04/07/14   Quentin Angst, MD  traMADol (ULTRAM) 50 MG tablet Take 2 tablets (100 mg total) by mouth every 6 (six) hours as needed. Do not use >8 tabs/day. 06/16/14   Sherren Mocha, MD   BP 154/91 mmHg  Pulse 93  Temp(Src) 98.2 F (36.8 C) (Oral)  Resp 15  SpO2 94% Physical Exam  Constitutional: He is oriented to person, place, and time. He appears well-developed and well-nourished. No distress.  HENT:  Head: Atraumatic.  Eyes: Conjunctivae are normal.  Neck: Neck supple.  Cardiovascular: Normal rate and regular rhythm.   Pulmonary/Chest: Effort normal and breath sounds normal.  Abdominal: Soft. He exhibits no distension. There is tenderness (Diffuse abdominal tenderness without focal point tenderness.).  Musculoskeletal: He exhibits tenderness (Patient complaining of pain to all body parts on mild palpation).  Neurological: He is alert and oriented to person, place, and time.  Skin: No rash noted.  Psychiatric: He has a normal mood and affect.  Nursing note and vitals reviewed.   ED Course  Procedures (including critical care time)  Patient here with generalized body aches and abdominal pain status post endoscopy and colonoscopy yesterday. Pain most significant to abdomen. Will obtain acute abdominal series to rule out possible perforation however he has no complaints of chest pain or shortness of breath. Denies any vomiting, vomiting of blood, or having bloody stools.  12:41 PM Acute abdominal series  demonstrate no evidence of bowel obstruction or free air. No other significant finding noted. Labs are reassuring. Mild hyperglycemia with glucose of 205, normal anion gap. Patient however continues to endorse significant abdominal pain. He also complaining of pain throughout all 4 extremities. He has intact distal pulses, was normal sensation, and no significant skin findings to his extremities. Due to recent procedural, I will obtain abdominal and pelvis CT scan for further evaluation and to rule out any types of perforation. Pain medication given.  2:48 PM Abdominal and pelvis CT scan shows no acute abnormalities. Therefore, I felt patient is stable for discharge and follow-up with PCP. He requests for narcotic pain medication however I do not think this is appropriate. I recommend patient to be managed by his PCP. Otherwise patient is able to ambulate, tolerates by mouth and stable for discharge.  Labs Review Labs Reviewed  I-STAT CHEM 8, ED - Abnormal; Notable for the following:    Chloride 98 (*)    BUN 5 (*)    Glucose, Bld 205 (*)    Calcium, Ion 1.11 (*)    All other components within normal limits  CBC WITH DIFFERENTIAL/PLATELET    Imaging Review Ct Abdomen Pelvis W Contrast  08/25/2014   CLINICAL DATA:  Endoscopy and colonoscopy yesterday. Diffuse body pain worse in the abdomen and chest beginning last night.  EXAM: CT ABDOMEN AND PELVIS WITH CONTRAST  TECHNIQUE: Multidetector CT imaging of the abdomen and pelvis was performed using the standard protocol following bolus administration of intravenous contrast.  CONTRAST:  63mL OMNIPAQUE IOHEXOL 300 MG/ML SOLN, OMNIPAQUE IOHEXOL 300 MG/ML SOLN  COMPARISON:  05/30/2014  FINDINGS: Minimal atelectasis is noted in the lung bases.  No focal liver lesion is identified. Mild extrahepatic and central intrahepatic biliary dilatation is similar to the prior study. The common bile duct tapers distally. The gallbladder, spleen, adrenal glands,  and pancreas are unremarkable. 5.5 cm interpolar right renal cyst is unchanged, as is a 2.0 cm cyst in the right renal sinus. 10 mm hypodense lesion in the upper pole of the left kidney is also unchanged and most likely represents a cyst.  Oral contrast is present throughout loops of nondilated small bowel. There is no evidence of bowel obstruction. No bowel wall thickening is seen. Appendix is identified in the right lower quadrant and is unremarkable. No intraperitoneal free air, free fluid, or enlarged lymph nodes are identified.  Bladder is unremarkable.  Mild thoracolumbar spondylosis is noted.  IMPRESSION: No acute abnormality identified in the abdomen or pelvis.   Electronically Signed   By: Sebastian Ache   On: 08/25/2014 14:32   Dg Abd Acute W/chest  08/25/2014   CLINICAL DATA:  Abdominal pain. One day post endoscopy and colonoscopy.  EXAM: DG ABDOMEN ACUTE W/ 1V CHEST  COMPARISON:  Chest radiograph March 28, 2014; CT abdomen and pelvis May 30, 2014  FINDINGS: PA chest: There is subsegmental atelectasis in the left lower lobe. Lungs elsewhere are clear. Heart size and pulmonary vascularity are normal. No adenopathy.  Supine and left lateral decubitus abdomen images: There is no bowel dilatation or air-fluid level suggesting obstruction. No free air. There is moderate stool in the colon. There are occasional phleboliths in the pelvis.  IMPRESSION: No bowel obstruction or free air. No lung edema or consolidation. Mild atelectasis left lower lobe.   Electronically Signed   By: Bretta Bang III M.D.   On: 08/25/2014 11:09     EKG Interpretation None      MDM   Final diagnoses:  Abdominal pain    BP 152/100 mmHg  Pulse 98  Temp(Src) 97.9 F (36.6 C) (Oral)  Resp 16  SpO2 99%  I have reviewed nursing notes and vital signs. I personally viewed the imaging tests through PACS system and agrees with radiologist's intepretation I reviewed available ER/hospitalization records through  the EMR     Fayrene Helper, PA-C 08/25/14 1451  Mirian Mo, MD 08/26/14 [phone]

## 2014-08-25 NOTE — Telephone Encounter (Signed)
Spoke with pt. He called GI this morning and they recommended he go to the ER. He did (see ED note). They gave him morphine he says (although I don't see this), and the PA's note says narcotic medicines not appropriate at this time. Pt wants to know if you could prescribe him something non narcotic. Advised that you may not be able to, but that I could ask. Thanks

## 2014-08-25 NOTE — ED Notes (Signed)
Bed: WA17 Expected date:  Expected time:  Means of arrival:  Comments: Ems-full body pain, secondary to endoscopy and colonoscopy yesterday

## 2014-08-26 ENCOUNTER — Ambulatory Visit (INDEPENDENT_AMBULATORY_CARE_PROVIDER_SITE_OTHER): Payer: Self-pay | Admitting: Emergency Medicine

## 2014-08-26 VITALS — BP 126/76 | HR 103 | Temp 98.5°F | Resp 17 | Ht 71.0 in | Wt 287.8 lb

## 2014-08-26 DIAGNOSIS — G894 Chronic pain syndrome: Secondary | ICD-10-CM

## 2014-08-26 DIAGNOSIS — R52 Pain, unspecified: Secondary | ICD-10-CM

## 2014-08-26 NOTE — Telephone Encounter (Signed)
Pt states he spoke wth GI, went to the ED, and had xray's done. He was advised by the GI Dr. To speak with Dr. Clelia Croft. Pt requesting to speak with Dr. Clelia Croft for clarification. [phone]
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
## 2014-08-26 NOTE — Progress Notes (Signed)
Subjective:  Patient ID: Travis [name], male    DOB: Jul 15, 1965  Age: 49 y.o. MRN: [id number]  CC: Pain; Medication Refill; and Depression   HPI Travis [name] presents  with generalized pain. He said that every muscle body hurts. Feet and his hands hurt. Underwent a  endoscopy and EGD under anesthesia with an endotracheal tube 2 days ago. He said that when he awoke from a nap following the procedure he had pain in every muscle. Last night he went to the emergency room and was seen and was given several doses of intravenous morphine. He had lab work performed and that was all normal. He did have a CAT scan of his abdomen and pelvis was normal. He had an acute abdominal series performed which was negative for free air. He communicated with Dr. Sherryll Burger earlier today by telephone who inclined to give him any pain medicine.  The patient has a history of polysubstance abuse. Dr. Clelia Croft indicated to you by telephone that he has misused and misplaced his pain medicine often enough that she is declining to give many further prescriptions. She reinforced my suspicions that he should not have any pain medicine given today. He claims allergy to muscle relaxants. He claims taking over-the-counter non-steroidal Tylenol with no improvement of his pain.  He has no fever chills. No dysuria urgency or frequency. No blood in his urine. No dark urine.  History Travis [name] has a past medical history of Hypertension; Tachycardia - pulse; Degenerative disk disease; Spinal stenosis; Degenerative disk disease; Tobacco abuse; Obesity; Polysubstance abuse; Chest pain; Spinal stenosis; Depression; Neuromuscular disorder; Anxiety; Shortness of breath dyspnea; PTSD (post-traumatic stress disorder); Incontinence of urine; GERD (gastroesophageal reflux disease); and Constipation.   He has past surgical history that includes Wisdom tooth extraction; Esophagogastroduodenoscopy (egd) with propofol (N/A, 08/24/2014); and Colonoscopy (N/A,  08/24/2014).   His  family history includes Cancer in his sister; Dementia in his father; Diabetes in his father and sister; Heart disease in his brother, father, and sister; Hyperlipidemia in his brother, father, mother, and sister; Hypertension in his father, mother, and sister; Stroke in his father, mother, and sister.  He   reports that he has quit smoking. His smoking use included Cigarettes. He has a .54 pack-year smoking history. He has never used smokeless tobacco. He reports that he does not drink alcohol or use illicit drugs.  Outpatient Prescriptions Prior to Visit  Medication Sig Dispense Refill  . bisacodyl (DULCOLAX) 5 MG EC tablet Take 5 mg by mouth daily as needed for moderate constipation (For constipation.).    Marland Kitchen cloNIDine (CATAPRES) 0.3 MG tablet Take 0.3 mg by mouth 2 (two) times daily.    . cyanocobalamin (,VITAMIN B-12,) 1000 MCG/ML injection Inject 1,000 mcg into the muscle every 30 (thirty) days.    . diphenhydrAMINE (BENADRYL) 25 MG tablet Take 50 mg by mouth every 6 (six) hours as needed for allergies.     Marland Kitchen ibuprofen (ADVIL,MOTRIN) 200 MG tablet Take 800 mg by mouth every 6 (six) hours as needed (For pain.).    Marland Kitchen lisinopril (PRINIVIL,ZESTRIL) 20 MG tablet Take 1 tablet (20 mg total) by mouth every morning. For high blood pressure 90 tablet 0  . metoprolol (LOPRESSOR) 50 MG tablet Take 1 tablet (50 mg total) by mouth 2 (two) times daily. For high blood pressure 180 tablet 0  . ranitidine (ZANTAC) 75 MG tablet Take 1 tablet (75 mg total) by mouth daily as needed for heartburn (heartburn). (Patient taking differently: Take 75 mg by  mouth daily as needed for heartburn. ) 60 tablet 3  . traMADol (ULTRAM) 50 MG tablet Take 2 tablets (100 mg total) by mouth every 6 (six) hours as needed. Do not use >8 tabs/day. (Patient taking differently: Take 100 mg by mouth every 6 (six) hours as needed (For pain.). Do not use >8 tabs/day.) 240 tablet 5  . diazepam (VALIUM) 10 MG tablet  Take 10 mg by mouth every 6 (six) hours as needed (For back spasms.).      No facility-administered medications prior to visit.    History   Social History  . Marital Status: Divorced    Spouse Name: N/A  . Number of Children: N/A  . Years of Education: N/A   Social History Main Topics  . Smoking status: Former Smoker -- 0.02 packs/day for 27 years    Types: Cigarettes  . Smokeless tobacco: Never Used     Comment: "quit April 2016, I havwe one every now and then"  . Alcohol Use: No  . Drug Use: No  . Sexual Activity: No   Other Topics Concern  . None   Social History Narrative     Review of Systems  Constitutional: Negative for fever, chills and appetite change.  HENT: Negative for congestion, ear pain, postnasal drip, sinus pressure and sore throat.   Eyes: Negative for pain and redness.  Respiratory: Negative for cough, shortness of breath and wheezing.   Cardiovascular: Negative for leg swelling.  Gastrointestinal: Negative for nausea, vomiting, abdominal pain, diarrhea, constipation and blood in stool.  Endocrine: Negative for polyuria.  Genitourinary: Negative for dysuria, urgency, frequency and flank pain.  Musculoskeletal: Positive for myalgias. Negative for gait problem.  Skin: Negative for rash.  Neurological: Negative for weakness and headaches.  Psychiatric/Behavioral: Negative for confusion and decreased concentration. The patient is not nervous/anxious.     Objective:  BP 126/76 mmHg  Pulse 103  Temp(Src) 98.5 F (36.9 C) (Oral)  Resp 17  Ht  (1.803 m)  Wt 287 lb 12.8 oz (130.545 kg)  BMI 40.16 kg/m2  SpO2 97%  Physical Exam  Constitutional: He is oriented to person, place, and time. He appears well-developed and well-nourished.  HENT:  Head: Normocephalic and atraumatic.  Eyes: Conjunctivae are normal. Pupils are equal, round, and reactive to light.  Pulmonary/Chest: Effort normal.  Musculoskeletal: He exhibits no edema.  Neurological:  He is alert and oriented to person, place, and time.  Skin: Skin is dry.  Psychiatric: He has a normal mood and affect. His behavior is normal. Thought content normal.   During the course of his visit here he may absolutely no eye contact. He looked at the floor during the whole time he was here. Despite his protestations of severe generalized pain he walked up here with no difficulty at all and no difficulty with gait.   Assessment & Plan:   Travis [name] was seen today for pain, medication refill and depression.  Diagnoses and all orders for this visit:  Generalized pain  Chronic pain syndrome   I am having Mr. Gettis maintain his diphenhydrAMINE, ranitidine, diazepam, lisinopril, metoprolol, traMADol, cloNIDine, cyanocobalamin, bisacodyl, and ibuprofen.  No orders of the defined types were placed in this encounter.   Asthma believe that his problem could be related to use of a depolarizing muscle relaxant as part of the rapid sequence intubation that he underwent as part of his endoscopy. While at type of medication is not often used in favor of a nondepolarizing muscle relaxants have  never seen a patient receiving a depolarizing muscle relaxant when a pain in my professional experience. I think his problem is drug seeking. Confirmed by Dr. Clelia Croft  Appropriate red flag conditions were discussed with the patient as well as actions that should be taken.  Patient expressed his understanding.  Follow-up: Return if symptoms worsen or fail to improve.  Carmelina Dane, MD

## 2014-08-26 NOTE — Patient Instructions (Signed)

## 2014-10-10 ENCOUNTER — Other Ambulatory Visit: Payer: Self-pay

## 2014-10-10 MED ORDER — CLONIDINE HCL 0.3 MG PO TABS: 0.3000 mg | ORAL_TABLET | Freq: Two times a day (BID) | ORAL | Status: DC

## 2014-10-10 NOTE — Telephone Encounter (Signed)
Pt needs a refill on Clonidine. Rx sent pt will follow up in 2 weeks.

## 2014-10-11 ENCOUNTER — Telehealth: Payer: Self-pay | Admitting: Radiology

## 2014-10-11 NOTE — Telephone Encounter (Signed)
Per Fayrene Fearing, he is unable to come in to see you because he does not have the money. He states he needs a refill of his Tramadol and should be able to RTC in about a month.
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
## 2014-10-12 NOTE — Telephone Encounter (Signed)
Spoke with patient and explained the message from Dr. Clelia Croft that she was not going to continue to provide his pain management and that no one has told him this prior and hung up the phone

## 2014-10-17 ENCOUNTER — Telehealth: Payer: Self-pay

## 2014-10-17 MED ORDER — CLONIDINE HCL 0.3 MG PO TABS: 0.3000 mg | ORAL_TABLET | Freq: Two times a day (BID) | ORAL | Status: DC

## 2014-10-17 NOTE — Telephone Encounter (Signed)
Pt is not taking Klonopin. Called pt and verified that he needs his catapres for BP. Sent in 1 mos to cover him until he comes back to see Dr Clelia Croft in sept.
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
## 2014-10-21 ENCOUNTER — Emergency Department (HOSPITAL_COMMUNITY)
Admission: EM | Admit: 2014-10-21 | Discharge: 2014-10-21 | Disposition: A | Discharge status: Home / Self Care | Payer: No Typology Code available for payment source | Attending: Emergency Medicine | Admitting: Emergency Medicine

## 2014-10-21 ENCOUNTER — Other Ambulatory Visit: Payer: Self-pay | Admitting: Internal Medicine

## 2014-10-21 ENCOUNTER — Encounter (HOSPITAL_COMMUNITY): Payer: Self-pay | Admitting: Emergency Medicine

## 2014-10-21 ENCOUNTER — Emergency Department (HOSPITAL_COMMUNITY): Payer: No Typology Code available for payment source

## 2014-10-21 DIAGNOSIS — Z87891 Personal history of nicotine dependence: Secondary | ICD-10-CM | POA: Insufficient documentation

## 2014-10-21 DIAGNOSIS — F419 Anxiety disorder, unspecified: Secondary | ICD-10-CM | POA: Insufficient documentation

## 2014-10-21 DIAGNOSIS — K219 Gastro-esophageal reflux disease without esophagitis: Secondary | ICD-10-CM | POA: Insufficient documentation

## 2014-10-21 DIAGNOSIS — R51 Headache: Secondary | ICD-10-CM | POA: Insufficient documentation

## 2014-10-21 DIAGNOSIS — R531 Weakness: Secondary | ICD-10-CM | POA: Insufficient documentation

## 2014-10-21 DIAGNOSIS — Z8669 Personal history of other diseases of the nervous system and sense organs: Secondary | ICD-10-CM | POA: Insufficient documentation

## 2014-10-21 DIAGNOSIS — Z79899 Other long term (current) drug therapy: Secondary | ICD-10-CM | POA: Insufficient documentation

## 2014-10-21 DIAGNOSIS — R569 Unspecified convulsions: Secondary | ICD-10-CM | POA: Insufficient documentation

## 2014-10-21 DIAGNOSIS — I10 Essential (primary) hypertension: Secondary | ICD-10-CM | POA: Insufficient documentation

## 2014-10-21 DIAGNOSIS — M549 Dorsalgia, unspecified: Secondary | ICD-10-CM | POA: Insufficient documentation

## 2014-10-21 DIAGNOSIS — R7989 Other specified abnormal findings of blood chemistry: Secondary | ICD-10-CM | POA: Insufficient documentation

## 2014-10-21 DIAGNOSIS — F329 Major depressive disorder, single episode, unspecified: Secondary | ICD-10-CM | POA: Insufficient documentation

## 2014-10-21 DIAGNOSIS — Z880 Allergy status to penicillin: Secondary | ICD-10-CM | POA: Insufficient documentation

## 2014-10-21 DIAGNOSIS — E669 Obesity, unspecified: Secondary | ICD-10-CM | POA: Insufficient documentation

## 2014-10-21 LAB — CBC
HCT: 42.9 % (ref 39.0–52.0)
Hemoglobin: 14.2 g/dL (ref 13.0–17.0)
MCH: 28.5 pg (ref 26.0–34.0)
MCHC: 33.1 g/dL (ref 30.0–36.0)
MCV: 86.1 fL (ref 78.0–100.0)
Platelets: 160 10*3/uL (ref 150–400)
RBC: 4.98 MIL/uL (ref 4.22–5.81)
RDW: 13.5 % (ref 11.5–15.5)
WBC: 7.1 10*3/uL (ref 4.0–10.5)

## 2014-10-21 LAB — BASIC METABOLIC PANEL
Anion gap: 10 (ref 5–15)
BUN: 14 mg/dL (ref 6–20)
CALCIUM: 8.9 mg/dL (ref 8.9–10.3)
CO2: 29 mmol/L (ref 22–32)
Chloride: 98 mmol/L — ABNORMAL LOW (ref 101–111)
Creatinine, Ser: 1.42 mg/dL — ABNORMAL HIGH (ref 0.61–1.24)
GFR calc Af Amer: >60 mL/min (ref >60)
GFR, EST NON AFRICAN AMERICAN: 58 mL/min — AB (ref >60)
Glucose, Bld: 152 mg/dL — ABNORMAL HIGH (ref 65–99)
POTASSIUM: 3.4 mmol/L — AB (ref 3.5–5.1)
Sodium: 137 mmol/L (ref 135–145)

## 2014-10-21 MED ORDER — DIAZEPAM 10 MG PO TABS: 10.0000 mg | ORAL_TABLET | Freq: Four times a day (QID) | ORAL | Status: DC | PRN

## 2014-10-21 MED ORDER — DIAZEPAM 5 MG PO TABS
10.0000 mg | ORAL_TABLET | Freq: Once | ORAL | Status: AC
  Administered 2014-10-21: 10 mg via ORAL
  Filled 2014-10-21: qty 2

## 2014-10-21 MED ORDER — ACETAMINOPHEN 500 MG PO TABS
1000.0000 mg | ORAL_TABLET | Freq: Once | ORAL | Status: AC
  Administered 2014-10-21: 1000 mg via ORAL
  Filled 2014-10-21: qty 2

## 2014-10-21 MED ORDER — SODIUM CHLORIDE 0.9 % IV BOLUS (SEPSIS)
1000.0000 mL | Freq: Once | INTRAVENOUS | Status: AC
  Administered 2014-10-21: 1000 mL via INTRAVENOUS

## 2014-10-21 NOTE — ED Notes (Signed)
Pt states that he changed PCPs recently. He ran out of his pain medications and the third day without his pain meds he had seizures.  Pt states that he had a "bad one yesterday" and last seizure was this morning.  Pt states that he blacks out and wakes up.  Pt states that he lives with his mother and states that she tells him what he does.

## 2014-10-21 NOTE — ED Provider Notes (Signed)
CSN: [id number]     Arrival date & time 10/21/14  1229 History   First MD Initiated Contact with Patient 10/21/14 1330     Chief Complaint  Patient presents with  . Seizures     (Consider location/radiation/quality/duration/timing/severity/associated sxs/prior Treatment) HPI  49 year old male presents with multiple seizures over the last 3-4 days. He states that he has been out of his Valium and tramadol for 1 week. He states that he has had seizures before when he has run out of Valium. About 3 days after not taking Valium he had at least one seizure per day including this morning. Had "a bad one" yesterday which she clarifies to me that he fell and hit his head and now has a headache and neck pain since the fall. No fevers. No new weakness or numbness but has chronic left lower extremity weakness due to spinal stenosis. Patient rates his pain in his head and neck as severe. Patient is currently switching doctors and states he has a appointment in 2 weeks with a new physician. Is currently requesting something for pain. His mom observes the seizures and states that they have lasted up to 4 minutes at a time.  Past Medical History  Diagnosis Date  . Hypertension   . Tachycardia - pulse   . Degenerative disk disease   . Spinal stenosis   . Degenerative disk disease   . Tobacco abuse   . Obesity   . Polysubstance abuse   . Chest pain     Hospital, March, 2014, negative enzymes, patient refused in-hospital  stress test, patient canceled outpatient stress test  . Spinal stenosis   . Depression   . Neuromuscular disorder   . Anxiety     Panic attacks  . Shortness of breath dyspnea     with exertion  . PTSD (post-traumatic stress disorder)   . Incontinence of urine   . GERD (gastroesophageal reflux disease)   . Constipation    Past Surgical History  Procedure Laterality Date  . Wisdom tooth extraction    . Esophagogastroduodenoscopy (egd) with propofol N/A 08/24/2014    Procedure:  ESOPHAGOGASTRODUODENOSCOPY (EGD) WITH PROPOFOL;  Surgeon: Charlott Rakes, MD;  Location: [facility] ENDOSCOPY;  Service: Endoscopy;  Laterality: N/A;  . Colonoscopy N/A 08/24/2014    Procedure: COLONOSCOPY;  Surgeon: Charlott Rakes, MD;  Location: [facility] ENDOSCOPY;  Service: Endoscopy;  Laterality: N/A;   Family History  Problem Relation Age of Onset  . Stroke Mother   . Hypertension Mother   . Hyperlipidemia Mother   . Stroke Father   . Hypertension Father   . Dementia Father   . Diabetes Father   . Heart disease Father   . Hyperlipidemia Father   . Cancer Sister     brain  . Diabetes Sister   . Heart disease Sister   . Hyperlipidemia Sister   . Hypertension Sister   . Stroke Sister   . Heart disease Brother   . Hyperlipidemia Brother    Social History  Substance Use Topics  . Smoking status: Former Smoker -- 0.02 packs/day for 27 years    Types: Cigarettes  . Smokeless tobacco: Never Used     Comment: "quit April 2016, I havwe one every now and then"  . Alcohol Use: No    Review of Systems  Constitutional: Negative for fever.  Eyes: Negative for visual disturbance.  Respiratory: Negative for shortness of breath.   Cardiovascular: Negative for chest pain.  Gastrointestinal: Negative for vomiting.  Musculoskeletal:  Positive for back pain and neck pain.  Neurological: Positive for seizures, weakness and headaches. Negative for numbness.  All other systems reviewed and are negative.     Allergies  Blueberry flavor; Penicillins; Robaxin; Flexeril; and Toradol  Home Medications   Prior to Admission medications   Medication Sig Start Date End Date Taking? Authorizing Provider  bisacodyl (DULCOLAX) 5 MG EC tablet Take 5 mg by mouth daily as needed for moderate constipation (For constipation.).   Yes Historical Provider, MD  cloNIDine (CATAPRES) 0.3 MG tablet Take 1 tablet (0.3 mg total) by mouth 2 (two) times daily. 10/17/14  Yes Sherren Mocha, MD  cyanocobalamin (,VITAMIN B-12,)  1000 MCG/ML injection Inject 1,000 mcg into the muscle every 30 (thirty) days.   Yes Historical Provider, MD  ibuprofen (ADVIL,MOTRIN) 200 MG tablet Take 800 mg by mouth every 6 (six) hours as needed (For pain.).   Yes Historical Provider, MD  lansoprazole (PREVACID) 30 MG capsule Take 30 mg by mouth daily at 12 noon.   Yes Historical Provider, MD  lisinopril (PRINIVIL,ZESTRIL) 20 MG tablet Take 1 tablet (20 mg total) by mouth every morning. For high blood pressure 05/16/14  Yes Thao P Le, DO  metoprolol (LOPRESSOR) 50 MG tablet Take 1 tablet (50 mg total) by mouth 2 (two) times daily. For high blood pressure 05/16/14  Yes Thao P Le, DO  traMADol (ULTRAM) 50 MG tablet Take 2 tablets (100 mg total) by mouth every 6 (six) hours as needed. Do not use >8 tabs/day. Patient taking differently: Take 100 mg by mouth every 6 (six) hours as needed (For pain.). Do not use >8 tabs/day. 06/16/14  Yes Sherren Mocha, MD  ranitidine (ZANTAC) 75 MG tablet Take 1 tablet (75 mg total) by mouth daily as needed for heartburn (heartburn). Patient not taking: Reported on 10/21/2014 04/07/14   Quentin Angst, MD   BP 133/93 mmHg  Pulse 73  Temp(Src) 97.8 F (36.6 C) (Oral)  Resp 14  SpO2 99% Physical Exam  Constitutional: He is oriented to person, place, and time. He appears well-developed and well-nourished.  obese  HENT:  Head: Normocephalic and atraumatic.  Right Ear: External ear normal.  Left Ear: External ear normal.  Nose: Nose normal.  No signs of head trauma  Eyes: EOM are normal. Pupils are equal, round, and reactive to light. Right eye exhibits no discharge. Left eye exhibits no discharge.  Neck: Normal range of motion. Neck supple. Spinous process tenderness and muscular tenderness present.  Cardiovascular: Normal rate, regular rhythm, normal heart sounds and intact distal pulses.   Pulmonary/Chest: Effort normal.  Abdominal: Soft. There is no tenderness.  Musculoskeletal: He exhibits no edema.   Neurological: He is alert and oriented to person, place, and time.  CN 2-12 grossly intact. 5/5 strength in RUE, RLE, LUE. 4/5 strength in LLE which patient states is normal for him, uses a cane. Normal finger to nose. No tremor or pronator drift  Skin: Skin is warm and dry.  Nursing note and vitals reviewed.   ED Course  Procedures (including critical care time) Labs Review Labs Reviewed  BASIC METABOLIC PANEL - Abnormal; Notable for the following:    Potassium 3.4 (*)    Chloride 98 (*)    Glucose, Bld 152 (*)    Creatinine, Ser 1.42 (*)    GFR calc non Af Amer 58 (*)    All other components within normal limits  CBC  CBG MONITORING, ED    Imaging Review  Ct Head Wo Contrast  10/21/2014   CLINICAL DATA:  Seizure, head injury  EXAM: CT HEAD WITHOUT CONTRAST  CT CERVICAL SPINE WITHOUT CONTRAST  TECHNIQUE: Multidetector CT imaging of the head and cervical spine was performed following the standard protocol without intravenous contrast. Multiplanar CT image reconstructions of the cervical spine were also generated.  COMPARISON:  04/05/2014  FINDINGS: CT HEAD FINDINGS  No skull fracture is noted. Paranasal sinuses and mastoid air cells are unremarkable. No intracranial hemorrhage, mass effect or midline shift. No acute cortical infarction. No mass lesion is noted on this unenhanced scan. The gray and white-matter differentiation is preserved.  CT CERVICAL SPINE FINDINGS  Axial images of the cervical spine shows no acute fracture or subluxation. Computer processed images shows no acute fracture or subluxation. Mild disc space flattening with anterior spurring at C5-C6 and C6-C7 level. Calcification of anterior longitudinal ligament at C5-C6 and C6-C7 level. No prevertebral soft tissue swelling. Cervical airway is patent. Mild degenerative changes C1-C2 articulation. No prevertebral soft tissue swelling. Cervical airway is patent.  IMPRESSION: 1. No acute intracranial abnormality.  No significant  change. 2. No cervical spine acute fracture or subluxation. Mild degenerative changes as described above.   Electronically Signed   By: Natasha Mead M.D.   On: 10/21/2014 14:48   Ct Cervical Spine Wo Contrast  10/21/2014   CLINICAL DATA:  Seizure, head injury  EXAM: CT HEAD WITHOUT CONTRAST  CT CERVICAL SPINE WITHOUT CONTRAST  TECHNIQUE: Multidetector CT imaging of the head and cervical spine was performed following the standard protocol without intravenous contrast. Multiplanar CT image reconstructions of the cervical spine were also generated.  COMPARISON:  04/05/2014  FINDINGS: CT HEAD FINDINGS  No skull fracture is noted. Paranasal sinuses and mastoid air cells are unremarkable. No intracranial hemorrhage, mass effect or midline shift. No acute cortical infarction. No mass lesion is noted on this unenhanced scan. The gray and white-matter differentiation is preserved.  CT CERVICAL SPINE FINDINGS  Axial images of the cervical spine shows no acute fracture or subluxation. Computer processed images shows no acute fracture or subluxation. Mild disc space flattening with anterior spurring at C5-C6 and C6-C7 level. Calcification of anterior longitudinal ligament at C5-C6 and C6-C7 level. No prevertebral soft tissue swelling. Cervical airway is patent. Mild degenerative changes C1-C2 articulation. No prevertebral soft tissue swelling. Cervical airway is patent.  IMPRESSION: 1. No acute intracranial abnormality.  No significant change. 2. No cervical spine acute fracture or subluxation. Mild degenerative changes as described above.   Electronically Signed   By: Natasha Mead M.D.   On: 10/21/2014 14:48   I have personally reviewed and evaluated these images and lab results as part of my medical decision-making.   EKG Interpretation None      MDM   Final diagnoses:  Seizure  Elevated serum creatinine    Patient has not had any seizure-like activity or signs of alcohol withdrawal while in the emergency  department. There is no tremor, tachycardia, altered mental status, or hypertension. 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 He also has a history of seeking pain medicine. I discussed that due to his chronic pain I will not be refilling his tramadol. This also could cause seizures I do not feel it is indicated now. Lab work and CT scan are unremarkable except mildly increased creatinine. He will be given a bolus of IV fluids and recommend follow-up with his PCP as is possible  for recheck. I will give him a small amount of Valium given that it is in his medication history and multiple charts to help prevent alcohol withdrawal. Discussed return precautions.    Pricilla Loveless, MD 10/21/14 [phone]

## 2014-10-21 NOTE — ED Notes (Signed)
Patient transported to CT 

## 2014-10-21 NOTE — ED Notes (Addendum)
Patient is A&Ox4 and reports being put of tramadol and valium x 1 week. No hx of seizure disorder but  Since this time pt reports experiencing several seizures as recent as this am. Pt reports striking forehead on door. He is uncertain whether he had LOC.

## 2014-10-21 NOTE — Progress Notes (Addendum)
Pt confirms with ED CM he has an orange card recently updated and goes to [facility] at [employer] Showed card to CM Reports he did not know he was choosing "a doctor that can not provided pain management"    CM discussed and provided written information for uninsured accepting pcps, discussed the importance of pcp vs EDP services for f/u care, www.needymeds.org, www.goodrx.com, discounted pharmacies and other [employer] such as [facility] , [employer], affordable care act, financial assistance, uninsured dental services, [place] med assist, DSS and  health department  Reviewed resources for [employer] uninsured accepting pcps like [facility], family medicine at [facility], [facility], [facility], local urgent care centers, [facility], [facility] family practice, general medical clinics, family services of the [place], [facility] urgent care plus others, medication resources, [facility] out patient pharmacies and housing Pt voiced understanding and appreciation of resources provided   Provided [facility] contact information CM sent a referral to [facility] to assist pt with change in [facility] pcp Pt also states he is filing for disability

## 2014-10-21 NOTE — Discharge Instructions (Signed)
Take the valium as instructed. Call your primary doctor as soon as possible for your multiple medical issues and refill of your chronic medicines. Your Creatinine (kidney function) was a little elevated at 1.4, you will need this rechecked by your primary doctor as well.

## 2014-10-23 ENCOUNTER — Telehealth: Payer: Self-pay | Admitting: *Deleted

## 2014-10-23 MED ORDER — CLONIDINE HCL 0.3 MG PO TABS: 0.3000 mg | ORAL_TABLET | Freq: Two times a day (BID) | ORAL | Status: DC

## 2014-10-23 NOTE — Telephone Encounter (Signed)
Pt called and states he lost his BP medication.  I advised pt to come to the office since his BP was 170/100...the patient states he will not be able to come in due to money issues.Marland KitchenMarland KitchenI advised pt that an Rx was sent over to [facility] pharmacy but they are closed today.  Pt was told that we will work on it today and get back with him.Marland KitchenMarland KitchenMarland Kitchen

## 2014-10-23 NOTE — Telephone Encounter (Signed)
I called patient, have discussed with Dr Patsy Lager, she states okay to send in his BP meds, advised him he should never run out of this medication. He voiced understanding. He states he is not sure if he lost them or if he did not ever pick them up, unable to call [facility] today to check, they are closed. Have resent to [facility], first he stated [facility], then he stated [address]. Sent in for him.

## 2014-10-30 ENCOUNTER — Encounter (HOSPITAL_COMMUNITY): Payer: Self-pay | Admitting: Emergency Medicine

## 2014-10-30 ENCOUNTER — Emergency Department (HOSPITAL_COMMUNITY)
Admission: EM | Admit: 2014-10-30 | Discharge: 2014-10-30 | Disposition: A | Discharge status: Home / Self Care | Payer: No Typology Code available for payment source | Attending: Emergency Medicine | Admitting: Emergency Medicine

## 2014-10-30 DIAGNOSIS — Z8739 Personal history of other diseases of the musculoskeletal system and connective tissue: Secondary | ICD-10-CM | POA: Insufficient documentation

## 2014-10-30 DIAGNOSIS — Z79899 Other long term (current) drug therapy: Secondary | ICD-10-CM | POA: Insufficient documentation

## 2014-10-30 DIAGNOSIS — Z8669 Personal history of other diseases of the nervous system and sense organs: Secondary | ICD-10-CM | POA: Insufficient documentation

## 2014-10-30 DIAGNOSIS — I10 Essential (primary) hypertension: Secondary | ICD-10-CM | POA: Insufficient documentation

## 2014-10-30 DIAGNOSIS — E669 Obesity, unspecified: Secondary | ICD-10-CM | POA: Insufficient documentation

## 2014-10-30 DIAGNOSIS — Z880 Allergy status to penicillin: Secondary | ICD-10-CM | POA: Insufficient documentation

## 2014-10-30 DIAGNOSIS — F410 Panic disorder [episodic paroxysmal anxiety] without agoraphobia: Secondary | ICD-10-CM | POA: Insufficient documentation

## 2014-10-30 DIAGNOSIS — Z87891 Personal history of nicotine dependence: Secondary | ICD-10-CM | POA: Insufficient documentation

## 2014-10-30 DIAGNOSIS — F329 Major depressive disorder, single episode, unspecified: Secondary | ICD-10-CM | POA: Insufficient documentation

## 2014-10-30 DIAGNOSIS — R569 Unspecified convulsions: Secondary | ICD-10-CM

## 2014-10-30 DIAGNOSIS — K219 Gastro-esophageal reflux disease without esophagitis: Secondary | ICD-10-CM | POA: Insufficient documentation

## 2014-10-30 LAB — CBC WITH DIFFERENTIAL/PLATELET
Basophils Absolute: 0 10*3/uL (ref 0.0–0.1)
Basophils Relative: 0 % (ref 0–1)
Eosinophils Absolute: 0.1 10*3/uL (ref 0.0–0.7)
Eosinophils Relative: 1 % (ref 0–5)
HEMATOCRIT: 42.6 % (ref 39.0–52.0)
Hemoglobin: 14.3 g/dL (ref 13.0–17.0)
LYMPHS PCT: 35 % (ref 12–46)
Lymphs Abs: 2.8 10*3/uL (ref 0.7–4.0)
MCH: 28.7 pg (ref 26.0–34.0)
MCHC: 33.6 g/dL (ref 30.0–36.0)
MCV: 85.5 fL (ref 78.0–100.0)
Monocytes Absolute: 0.7 10*3/uL (ref 0.1–1.0)
Monocytes Relative: 9 % (ref 3–12)
NEUTROS ABS: 4.4 10*3/uL (ref 1.7–7.7)
Neutrophils Relative %: 55 % (ref 43–77)
Platelets: 210 10*3/uL (ref 150–400)
RBC: 4.98 MIL/uL (ref 4.22–5.81)
RDW: 13 % (ref 11.5–15.5)
WBC: 8 10*3/uL (ref 4.0–10.5)

## 2014-10-30 LAB — I-STAT CG4 LACTIC ACID, ED: Lactic Acid, Venous: 1.32 mmol/L (ref 0.5–2.0)

## 2014-10-30 LAB — BASIC METABOLIC PANEL
ANION GAP: 8 (ref 5–15)
BUN: 10 mg/dL (ref 6–20)
CO2: 29 mmol/L (ref 22–32)
Calcium: 8.7 mg/dL — ABNORMAL LOW (ref 8.9–10.3)
Chloride: 100 mmol/L — ABNORMAL LOW (ref 101–111)
Creatinine, Ser: 1.06 mg/dL (ref 0.61–1.24)
GFR calc Af Amer: >60 mL/min (ref >60)
GFR calc non Af Amer: >60 mL/min (ref >60)
GLUCOSE: 172 mg/dL — AB (ref 65–99)
Potassium: 3.2 mmol/L — ABNORMAL LOW (ref 3.5–5.1)
Sodium: 137 mmol/L (ref 135–145)

## 2014-10-30 LAB — CBG MONITORING, ED: Glucose-Capillary: 173 mg/dL — ABNORMAL HIGH (ref 65–99)

## 2014-10-30 MED ORDER — CHLORDIAZEPOXIDE HCL 25 MG PO CAPS: ORAL_CAPSULE | ORAL | Status: DC

## 2014-10-30 MED ORDER — DIAZEPAM 5 MG PO TABS
5.0000 mg | ORAL_TABLET | Freq: Once | ORAL | Status: AC
Start: 2014-10-30 — End: 2014-10-30
  Administered 2014-10-30: 5 mg via ORAL
  Filled 2014-10-30: qty 1

## 2014-10-30 NOTE — ED Notes (Signed)
Bed: WA09 Expected date:  Expected time:  Means of arrival:  Comments: seizure 

## 2014-10-30 NOTE — ED Notes (Signed)
Brought in b y EMS from home with c/o seizures.  Per EMS, pt was found by his mother "seizing on the floor"--- described activity as "grand mal" that lasted approximately 45 seconds to a minute.  Pt awake and fully alert on EMS' arrival.

## 2014-10-30 NOTE — ED Provider Notes (Signed)
CSN: [id number]     Arrival date & time 10/30/14  0046 History  This chart was scribed for Marisa Severin, MD by Doreatha Martin, ED Scribe. This patient was seen in room WA09/WA09 and the patient's care was started at 1:29 AM.      Chief Complaint  Patient presents with  . Seizures   The history is provided by the patient. No language interpreter was used.    HPI Comments: Travis [name] is a 49 y.o. male with Hx of HTN, DDD, spinal stenosis, polysubstance abuse, GERD, anxiety, neuromuscular disorder who presents to the Emergency Department complaining of a moderate seizure lasting 7 minutes onset tonight. He notes that he was conscious during the seizure. Pt notes a Hx of similar intermittent seizures for a few months since discontinuing Tramadol, but not this severe. He states that he has taken Tramadol and Valium for 15 years and has seizures when he stops taking these medicines (last dose of Tramadol 2 days ago). He is followed by [facility]. He notes that he is no longer prescribed the Tramadol from his PCP. He notes that he is in the process of being seen by [facility]. Pt was seen in the ED on 8/19 for seizures. He has not been seen by Neurology, but has an appointment for January 2017. He also notes that he took Lisinopril, hydrochlorothiazide, clonodine twice tonight, at 2400 and again after his seizure. He notes that he quit smoking 6 months ago. He denies current cough or fever.   Past Medical History  Diagnosis Date  . Hypertension   . Tachycardia - pulse   . Degenerative disk disease   . Spinal stenosis   . Degenerative disk disease   . Tobacco abuse   . Obesity   . Polysubstance abuse   . Chest pain     Hospital, March, 2014, negative enzymes, patient refused in-hospital  stress test, patient canceled outpatient stress test  . Spinal stenosis   . Depression   . Neuromuscular disorder   . Anxiety     Panic attacks  . Shortness of breath dyspnea      with exertion  . PTSD (post-traumatic stress disorder)   . Incontinence of urine   . GERD (gastroesophageal reflux disease)   . Constipation    Past Surgical History  Procedure Laterality Date  . Wisdom tooth extraction    . Esophagogastroduodenoscopy (egd) with propofol N/A 08/24/2014    Procedure: ESOPHAGOGASTRODUODENOSCOPY (EGD) WITH PROPOFOL;  Surgeon: Charlott Rakes, MD;  Location: [facility] ENDOSCOPY;  Service: Endoscopy;  Laterality: N/A;  . Colonoscopy N/A 08/24/2014    Procedure: COLONOSCOPY;  Surgeon: Charlott Rakes, MD;  Location: [facility] ENDOSCOPY;  Service: Endoscopy;  Laterality: N/A;   Family History  Problem Relation Age of Onset  . Stroke Mother   . Hypertension Mother   . Hyperlipidemia Mother   . Stroke Father   . Hypertension Father   . Dementia Father   . Diabetes Father   . Heart disease Father   . Hyperlipidemia Father   . Cancer Sister     brain  . Diabetes Sister   . Heart disease Sister   . Hyperlipidemia Sister   . Hypertension Sister   . Stroke Sister   . Heart disease Brother   . Hyperlipidemia Brother    Social History  Substance Use Topics  . Smoking status: Former Smoker -- 0.02 packs/day for 27 years    Types: Cigarettes  . Smokeless tobacco: Never  Used     Comment: "quit April 2016, I havwe one every now and then"  . Alcohol Use: No    Review of Systems  Constitutional: Negative for fever.  Respiratory: Negative for cough.   Neurological: Positive for seizures. Negative for syncope.  All other systems reviewed and are negative.  Allergies  Blueberry flavor; Penicillins; Robaxin; Flexeril; and Toradol  Home Medications   Prior to Admission medications   Medication Sig Start Date End Date Taking? Authorizing Provider  bisacodyl (DULCOLAX) 5 MG EC tablet Take 5 mg by mouth daily as needed for moderate constipation (For constipation.).    Historical Provider, MD  cloNIDine (CATAPRES) 0.3 MG tablet Take 1 tablet (0.3 mg total) by mouth 2  (two) times daily. 10/23/14   Gwenlyn Found Copland, MD  cyanocobalamin (,VITAMIN B-12,) 1000 MCG/ML injection Inject 1,000 mcg into the muscle every 30 (thirty) days.    Historical Provider, MD  diazepam (VALIUM) 10 MG tablet Take 1 tablet (10 mg total) by mouth every 6 (six) hours as needed for anxiety. 10/21/14   Pricilla Loveless, MD  ibuprofen (ADVIL,MOTRIN) 200 MG tablet Take 800 mg by mouth every 6 (six) hours as needed (For pain.).    Historical Provider, MD  lansoprazole (PREVACID) 30 MG capsule Take 30 mg by mouth daily at 12 noon.    Historical Provider, MD  lisinopril (PRINIVIL,ZESTRIL) 20 MG tablet Take 1 tablet (20 mg total) by mouth every morning. For high blood pressure 05/16/14   Thao P Le, DO  metoprolol (LOPRESSOR) 50 MG tablet Take 1 tablet (50 mg total) by mouth 2 (two) times daily. For high blood pressure 05/16/14   Thao P Le, DO  traMADol (ULTRAM) 50 MG tablet Take 2 tablets (100 mg total) by mouth every 6 (six) hours as needed. Do not use >8 tabs/day. Patient taking differently: Take 100 mg by mouth every 6 (six) hours as needed (For pain.). Do not use >8 tabs/day. 06/16/14   Sherren Mocha, MD   BP 135/86 mmHg  Pulse 44  Temp(Src) 98 F (36.7 C) (Oral)  Resp 14  SpO2 96% Physical Exam  Constitutional: He is oriented to person, place, and time. He appears well-developed and well-nourished.  HENT:  Head: Normocephalic and atraumatic.  Right Ear: External ear normal.  Left Ear: External ear normal.  Nose: Nose normal.  Mouth/Throat: Oropharynx is clear and moist.  Eyes: Conjunctivae and EOM are normal. Pupils are equal, round, and reactive to light.  Neck: Normal range of motion. Neck supple. No JVD present. No tracheal deviation present. No thyromegaly present.  Cardiovascular: Normal rate, regular rhythm, normal heart sounds and intact distal pulses.  Exam reveals no gallop and no friction rub.   No murmur heard. Pulmonary/Chest: Effort normal and breath sounds normal. No stridor.  No respiratory distress. He has no wheezes. He has no rales. He exhibits no tenderness.  Abdominal: Soft. Bowel sounds are normal. He exhibits no distension and no mass. There is no tenderness. There is no rebound and no guarding.  Musculoskeletal: Normal range of motion. He exhibits no edema or tenderness.  Lymphadenopathy:    He has no cervical adenopathy.  Neurological: He is alert and oriented to person, place, and time. He displays normal reflexes. No cranial nerve deficit. He exhibits normal muscle tone. Coordination normal.  Skin: Skin is warm and dry. No rash noted. No erythema. No pallor.  Psychiatric: He has a normal mood and affect. His behavior is normal. Judgment and thought content  normal.  Nursing note and vitals reviewed.  ED Course  Procedures (including critical care time) DIAGNOSTIC STUDIES: Oxygen Saturation is 96% on RA, aduquate by my interpretation.    COORDINATION OF CARE: 1:38 AM Discussed treatment plan with pt at bedside and pt agreed to plan.   Labs Review Labs Reviewed  BASIC METABOLIC PANEL - Abnormal; Notable for the following:    Potassium 3.2 (*)    Chloride 100 (*)    Glucose, Bld 172 (*)    Calcium 8.7 (*)    All other components within normal limits  CBG MONITORING, ED - Abnormal; Notable for the following:    Glucose-Capillary 173 (*)    All other components within normal limits  CBC WITH DIFFERENTIAL/PLATELET  I-STAT CG4 LACTIC ACID, ED    Imaging Review No results found. I have personally reviewed and evaluated these images and lab results as part of my medical decision-making.   EKG Interpretation   Date/Time:  Sunday October 30 2014 00:54:32 EDT Ventricular Rate:  45 PR Interval:  181 QRS Duration: 107 QT Interval:  484 QTC Calculation: 419 R Axis:     Text Interpretation:  Sinus bradycardia Left ventricular hypertrophy  Nonspecific T abnormalities, inferior leads Confirmed by Aesha Agrawal  MD, Cipriano Millikan  ([id number]) on 10/30/2014 2:02:43 AM       MDM   Final diagnoses:  Seizure   49 year old male with reported seizure.  Seizure was witnessed by his mother.  Patient with similar presentation of the ER last week.  Patient reports that since running out of his Ultram.  It causes his seizures.  He also is out of his Valium.  Prior charts reviewed, patient has had drug-seeking behavior in the past with multiple lost prescriptions and early refills.  Urgent care, will not fill any of his medications anymore.  Patient with normal lactate.  Patient also reports that he may take an extra blood pressure medicine tonight.  I discussed with the patient that I think Ultram is a bad medication for him.  If he is prone to have seizures as it lowers his seizure threshold.  I will write him for a taper off of the benzodiazepine's.  Will monitor for hypotension.  Given his possible accidental double dosing of his blood pressure medicines I personally performed the services described in this documentation, which was scribed in my presence. The recorded information has been reviewed and is accurate.    Marisa Severin, MD 10/30/14 [phone]

## 2014-10-30 NOTE — Discharge Instructions (Signed)
Do not take your morning blood pressure medicines as it is possible that you double dosed on your evening medicines.  Your workup today show no damage from your seizure.  It is important for you not take tramadol as this may cause further seizures.  It is recommended that you taper off your benzodiazepine's.  You have been given a prescription for Librium to help you with this.  Contact your doctor on Monday for close follow-up.    Seizure, Adult A seizure is abnormal electrical activity in the brain. Seizures usually last from 30 seconds to 2 minutes. There are various types of seizures. Before a seizure, you may have a warning sensation (aura) that a seizure is about to occur. An aura may include the following symptoms:   Fear or anxiety.  Nausea.  Feeling like the room is spinning (vertigo).  Vision changes, such as seeing flashing lights or spots. Common symptoms during a seizure include:  A change in attention or behavior (altered mental status).  Convulsions with rhythmic jerking movements.  Drooling.  Rapid eye movements.  Grunting.  Loss of bladder and bowel control.  Bitter taste in the mouth.  Tongue biting. After a seizure, you may feel confused and sleepy. You may also have an injury resulting from convulsions during the seizure. HOME CARE INSTRUCTIONS   If you are given medicines, take them exactly as prescribed by your health care provider.  Keep all follow-up appointments as directed by your health care provider.  Do not swim or drive or engage in risky activity during which a seizure could cause further injury to you or others until your health care provider says it is OK.  Get adequate rest.  Teach friends and family what to do if you have a seizure. They should:  Lay you on the ground to prevent a fall.  Put a cushion under your head.  Loosen any tight clothing around your neck.  Turn you on your side. If vomiting occurs, this helps keep your  airway clear.  Stay with you until you recover.  Know whether or not you need emergency care. SEEK IMMEDIATE MEDICAL CARE IF:  The seizure lasts longer than 5 minutes.  The seizure is severe or you do not wake up immediately after the seizure.  You have an altered mental status after the seizure.  You are having more frequent or worsening seizures. Someone should drive you to the emergency department or call local emergency services (911 in [place] MAKE SURE YOU:  Understand these instructions.  Will watch your condition.  Will get help right away if you are not doing well or get worse. Document Released: 02/16/2000 Document Revised: 12/09/2012 Document Reviewed: 09/30/2012 [facility] Patient Information 2015 [place], [place]. This information is not intended to replace advice given to you by your health care provider. Make sure you discuss any questions you have with your health care provider.

## 2014-10-31 ENCOUNTER — Other Ambulatory Visit: Payer: Self-pay | Admitting: Family Medicine

## 2014-11-09 ENCOUNTER — Other Ambulatory Visit: Payer: Self-pay | Admitting: Physician Assistant

## 2014-11-20 ENCOUNTER — Encounter (HOSPITAL_COMMUNITY): Payer: Self-pay

## 2014-11-20 ENCOUNTER — Emergency Department (HOSPITAL_COMMUNITY)
Admission: EM | Admit: 2014-11-20 | Discharge: 2014-11-21 | Disposition: A | Discharge status: Home / Self Care | Payer: No Typology Code available for payment source | Attending: Emergency Medicine | Admitting: Emergency Medicine

## 2014-11-20 ENCOUNTER — Telehealth: Payer: Self-pay

## 2014-11-20 DIAGNOSIS — M542 Cervicalgia: Secondary | ICD-10-CM | POA: Insufficient documentation

## 2014-11-20 DIAGNOSIS — Z87891 Personal history of nicotine dependence: Secondary | ICD-10-CM | POA: Insufficient documentation

## 2014-11-20 DIAGNOSIS — Z79899 Other long term (current) drug therapy: Secondary | ICD-10-CM | POA: Insufficient documentation

## 2014-11-20 DIAGNOSIS — E663 Overweight: Secondary | ICD-10-CM | POA: Insufficient documentation

## 2014-11-20 DIAGNOSIS — F329 Major depressive disorder, single episode, unspecified: Secondary | ICD-10-CM | POA: Insufficient documentation

## 2014-11-20 DIAGNOSIS — F419 Anxiety disorder, unspecified: Secondary | ICD-10-CM | POA: Insufficient documentation

## 2014-11-20 DIAGNOSIS — K219 Gastro-esophageal reflux disease without esophagitis: Secondary | ICD-10-CM | POA: Insufficient documentation

## 2014-11-20 DIAGNOSIS — I10 Essential (primary) hypertension: Secondary | ICD-10-CM | POA: Insufficient documentation

## 2014-11-20 DIAGNOSIS — R569 Unspecified convulsions: Secondary | ICD-10-CM | POA: Insufficient documentation

## 2014-11-20 DIAGNOSIS — Z8669 Personal history of other diseases of the nervous system and sense organs: Secondary | ICD-10-CM | POA: Insufficient documentation

## 2014-11-20 DIAGNOSIS — E669 Obesity, unspecified: Secondary | ICD-10-CM | POA: Insufficient documentation

## 2014-11-20 DIAGNOSIS — F1320 Sedative, hypnotic or anxiolytic dependence, uncomplicated: Secondary | ICD-10-CM

## 2014-11-20 DIAGNOSIS — Z7951 Long term (current) use of inhaled steroids: Secondary | ICD-10-CM | POA: Insufficient documentation

## 2014-11-20 DIAGNOSIS — Z880 Allergy status to penicillin: Secondary | ICD-10-CM | POA: Insufficient documentation

## 2014-11-20 LAB — CBC WITH DIFFERENTIAL/PLATELET
BASOS ABS: 0 10*3/uL (ref 0.0–0.1)
BASOS PCT: 1 %
EOS PCT: 1 %
Eosinophils Absolute: 0.1 10*3/uL (ref 0.0–0.7)
HCT: 42.9 % (ref 39.0–52.0)
Hemoglobin: 14.7 g/dL (ref 13.0–17.0)
Lymphocytes Relative: 35 %
Lymphs Abs: 2.6 10*3/uL (ref 0.7–4.0)
MCH: 29.2 pg (ref 26.0–34.0)
MCHC: 34.3 g/dL (ref 30.0–36.0)
MCV: 85.1 fL (ref 78.0–100.0)
MONO ABS: 0.6 10*3/uL (ref 0.1–1.0)
Monocytes Relative: 8 %
Neutro Abs: 4.2 10*3/uL (ref 1.7–7.7)
Neutrophils Relative %: 55 %
PLATELETS: 157 10*3/uL (ref 150–400)
RBC: 5.04 MIL/uL (ref 4.22–5.81)
RDW: 13.2 % (ref 11.5–15.5)
WBC: 7.6 10*3/uL (ref 4.0–10.5)

## 2014-11-20 LAB — CK: Total CK: 132 U/L (ref 49–397)

## 2014-11-20 LAB — BASIC METABOLIC PANEL
ANION GAP: 11 (ref 5–15)
BUN: 6 mg/dL (ref 6–20)
CALCIUM: 8.3 mg/dL — AB (ref 8.9–10.3)
CO2: 27 mmol/L (ref 22–32)
Chloride: 95 mmol/L — ABNORMAL LOW (ref 101–111)
Creatinine, Ser: 0.95 mg/dL (ref 0.61–1.24)
GFR calc Af Amer: >60 mL/min (ref >60)
GLUCOSE: 121 mg/dL — AB (ref 65–99)
Potassium: 3.1 mmol/L — ABNORMAL LOW (ref 3.5–5.1)
Sodium: 133 mmol/L — ABNORMAL LOW (ref 135–145)

## 2014-11-20 NOTE — ED Notes (Signed)
Pt arrived via EMS c/o seizures.  Pt stated he has had 4 seizures today the last one was witnessed by his mother and lasted 2 minutes.  Pt reports he fell on concrete earlier did not hit head denies any LOC.  Pt reports chronic neck and back pain 10/10.

## 2014-11-20 NOTE — Telephone Encounter (Signed)
He wants to know if he sees Dr.Shaw tomorrow, will she be able to write prescription for TRAMADOL. He said there was an issue last time where he wasn't able to get that.  Please advise & leave a message on voicemail  [phone]

## 2014-11-20 NOTE — ED Provider Notes (Signed)
CSN: [id number]     Arrival date & time 11/20/14  2245 History  This chart was scribed for Shon Baton, MD by Leone Payor, ED Scribe. This patient was seen in room A04C/A04C and the patient's care was started 11:24 PM.      Chief Complaint  Patient presents with  . Seizures   The history is provided by the patient. No language interpreter was used.     HPI Comments: Tonatiuh Mallon is a 49 y.o. male with past medical history of polysubstance abuse who presents to the Emergency Department complaining of 4 seizures that occurred earlier today. He reports he was able to recall the first 2 which occurred when he was lying in bed. He states the last 2 were witnessed by his mother and involved LOC and a fall. He reports striking his right side on the ground but denies head injury. He reports associated "fuzzy memory" after these episodes. Patient states he started having seizures a few months ago which have happened during times when he was out of Valium. He states he normally takes Valium for muscle spasms and has not had any for the last 2 days. He reports buying Valium on the street since he is unable to get a prescription. He reports difficulty walking which he has at baseline and requires use of a cane. He was last seen in the ED on 10/30/14 and 10/21/14 for the same symptoms. He reports that he has an orange card and is in line to be evaluated by neurologist. He has never had an EEG.  I reviewed the patient's chart. History of drug-seeking behavior. His last visit he reported that he was out of Ultram and that was causing his seizures.  He reports chronic pain and difficult access to healthcare.    Past Medical History  Diagnosis Date  . Hypertension   . Tachycardia - pulse   . Degenerative disk disease   . Spinal stenosis   . Degenerative disk disease   . Tobacco abuse   . Obesity   . Polysubstance abuse   . Chest pain     Hospital, March, 2014, negative enzymes, patient refused  in-hospital  stress test, patient canceled outpatient stress test  . Spinal stenosis   . Depression   . Neuromuscular disorder   . Anxiety     Panic attacks  . Shortness of breath dyspnea     with exertion  . PTSD (post-traumatic stress disorder)   . Incontinence of urine   . GERD (gastroesophageal reflux disease)   . Constipation    Past Surgical History  Procedure Laterality Date  . Wisdom tooth extraction    . Esophagogastroduodenoscopy (egd) with propofol N/A 08/24/2014    Procedure: ESOPHAGOGASTRODUODENOSCOPY (EGD) WITH PROPOFOL;  Surgeon: Charlott Rakes, MD;  Location: [facility] ENDOSCOPY;  Service: Endoscopy;  Laterality: N/A;  . Colonoscopy N/A 08/24/2014    Procedure: COLONOSCOPY;  Surgeon: Charlott Rakes, MD;  Location: [facility] ENDOSCOPY;  Service: Endoscopy;  Laterality: N/A;   Family History  Problem Relation Age of Onset  . Stroke Mother   . Hypertension Mother   . Hyperlipidemia Mother   . Stroke Father   . Hypertension Father   . Dementia Father   . Diabetes Father   . Heart disease Father   . Hyperlipidemia Father   . Cancer Sister     brain  . Diabetes Sister   . Heart disease Sister   . Hyperlipidemia Sister   . Hypertension Sister   .  Stroke Sister   . Heart disease Brother   . Hyperlipidemia Brother    Social History  Substance Use Topics  . Smoking status: Former Smoker -- 0.02 packs/day for 27 years    Types: Cigarettes  . Smokeless tobacco: Never Used     Comment: "quit April 2016, I havwe one every now and then"  . Alcohol Use: No    Review of Systems  Constitutional: Negative.  Negative for fever.  Respiratory: Negative.  Negative for chest tightness and shortness of breath.   Cardiovascular: Negative.  Negative for chest pain.  Gastrointestinal: Negative.  Negative for abdominal pain.  Genitourinary: Negative.   Musculoskeletal: Positive for back pain and neck pain.  Skin: Negative for rash.  Neurological: Positive for seizures. Negative for  syncope, weakness, numbness and headaches.  All other systems reviewed and are negative.     Allergies  Blueberry flavor; Penicillins; Robaxin; Flexeril; and Toradol  Home Medications   Prior to Admission medications   Medication Sig Start Date End Date Taking? Authorizing Provider  chlordiazePOXIDE (LIBRIUM) 25 MG capsule  PO TID x 1D, then 25-50mg  PO BID X 1D, then 25-50mg  PO QD X 1D 10/30/14   Marisa Severin, MD  cloNIDine (CATAPRES) 0.3 MG tablet TAKE 1 TABLET BY MOUTH TWICE A DAY 11/01/14   Chelle Jeffery, PA-C  diazepam (VALIUM) 5 MG tablet Take 1 tablet (5 mg total) by mouth every 8 (eight) hours as needed for muscle spasms. 11/21/14   Shon Baton, MD  fluticasone (FLONASE) 50 MCG/ACT nasal spray Place 1 spray into both nostrils daily.    Historical Provider, MD  ibuprofen (ADVIL,MOTRIN) 200 MG tablet Take 800 mg by mouth every 6 (six) hours as needed (For pain.).    Historical Provider, MD  lansoprazole (PREVACID) 30 MG capsule Take 30 mg by mouth daily at 12 noon.    Historical Provider, MD  lisinopril (PRINIVIL,ZESTRIL) 20 MG tablet Take 1 tablet (20 mg total) by mouth every morning. For high blood pressure 05/16/14   Thao P Le, DO  metoprolol (LOPRESSOR) 50 MG tablet Take 1 tablet (50 mg total) by mouth 2 (two) times daily. For high blood pressure Patient not taking: Reported on 10/30/2014 05/16/14   Thao P Le, DO  traMADol (ULTRAM) 50 MG tablet Take 2 tablets (100 mg total) by mouth every 6 (six) hours as needed. Do not use >8 tabs/day. Patient taking differently: Take 100 mg by mouth every 6 (six) hours as needed (For pain.). Do not use >8 tabs/day. 06/16/14   Sherren Mocha, MD   BP 145/100 mmHg  Pulse 84  Temp(Src) 98 F (36.7 C) (Oral)  Resp 18  SpO2 97% Physical Exam  Constitutional: He is oriented to person, place, and time. He appears well-developed.  Overweight, appears older than stated age  HENT:  Head: Normocephalic and atraumatic.  Mouth/Throat: Oropharynx is  clear and moist.  Eyes: EOM are normal. Pupils are equal, round, and reactive to light.  Cardiovascular: Normal rate, regular rhythm and normal heart sounds.   No murmur heard. Pulmonary/Chest: Effort normal and breath sounds normal. No respiratory distress. He has no wheezes.  Abdominal: Soft. Bowel sounds are normal. There is no tenderness. There is no rebound and no guarding.  Musculoskeletal: He exhibits no edema.  Neurological: He is alert and oriented to person, place, and time.  Cranial nerves II through XII intact, 5 out of 5 strength in all 4 extremities, no dysmetria to finger-nose-finger  Skin: Skin is warm  and dry.  Psychiatric: He has a normal mood and affect.  Nursing note and vitals reviewed.   ED Course  Procedures (including critical care time)  DIAGNOSTIC STUDIES: Oxygen Saturation is 96% on RA, adequate by my interpretation.    COORDINATION OF CARE: 11:32 PM Discussed treatment plan with pt at bedside and pt agreed to plan.  12:50 AM Patient rechecked; not interested in Benzo detox.   Labs Review Labs Reviewed  BASIC METABOLIC PANEL - Abnormal; Notable for the following:    Sodium 133 (*)    Potassium 3.1 (*)    Chloride 95 (*)    Glucose, Bld 121 (*)    Calcium 8.3 (*)    All other components within normal limits  CBC WITH DIFFERENTIAL/PLATELET  CK  TROPONIN I    Imaging Review No results found. I have personally reviewed and evaluated these lab results as part of my medical decision-making.   EKG Interpretation   Date/Time:  Monday November 21 2014 00:20:35 EDT Ventricular Rate:  77 PR Interval:  205 QRS Duration: 87 QT Interval:  398 QTC Calculation: 450 R Axis:   -5 Text Interpretation:  Sinus rhythm Borderline prolonged PR interval  Probable left atrial enlargement Borderline repolarization abnormality  Borderline ST elevation, lateral leads Confirmed by HORTON  MD, COURTNEY  ([id number]) on 11/21/2014 12:28:10 AM      MDM   Final  diagnoses:  Seizure  Benzodiazepine dependence    Patient presents with reported seizures at home.  ?history but reports being out of his Valium for >2 days.  Also reports illegally obtaining valium.  His history is not completely consistent with seizures. However, given chronic Valium use and reports that he is currently out of Valium, this would put him at risk for benzodiazepine withdrawal seizures. He is not postictal and his neurologic exam is reassuring. He was given one dose of by mouth Valium in the ER. Patient reports that he is working to get primary care established as well as neurology follow-up. Discussed the case with neurology on call.  Unfortunately these episodes will need to be treated as withdrawal seizures with further benzodiazepine. Discussed with the patient a possible psychiatric admission for admission for benzo detox. Patient is not interested in coming off of his benzodiazepine's. Discussed with him that he should not expect to receive refills of medications in the ER especially of controlled substances. He will be given a 3 day supply of medication to prevent seizures but will need to follow-up with a primary physician for further medication request. An ambulatory neurology follow-up request was also made.  After history, exam, and medical workup I feel the patient has been appropriately medically screened and is safe for discharge home. Pertinent diagnoses were discussed with the patient. Patient was given return precautions.  I personally performed the services described in this documentation, which was scribed in my presence. The recorded information has been reviewed and is accurate.   Shon Baton, MD 11/21/14 0100

## 2014-11-21 LAB — TROPONIN I

## 2014-11-21 MED ORDER — DIAZEPAM 5 MG PO TABS
5.0000 mg | ORAL_TABLET | Freq: Once | ORAL | Status: AC
  Administered 2014-11-21: 5 mg via ORAL
  Filled 2014-11-21: qty 1

## 2014-11-21 MED ORDER — DIAZEPAM 5 MG PO TABS: 5.0000 mg | ORAL_TABLET | Freq: Three times a day (TID) | ORAL | Status: DC | PRN

## 2014-11-21 NOTE — Telephone Encounter (Signed)
Spoke with pt, advised message from Dr. Shaw. Pt understood. 

## 2014-11-21 NOTE — Telephone Encounter (Signed)
Pt would like refill on Tramadol Please advise 

## 2014-11-21 NOTE — Discharge Instructions (Signed)
You were seen for seizures. This may be related to withdrawal of benzodiazepines. At this time you are not interested in coming off your benzodiazepine. You'll be given a very short course of medications. You should not expect to receive medication refills in the ER.  Benzodiazepine Withdrawal  Benzodiazepines are a group of drugs that are prescribed for both short-term and long-term treatment of a variety of medical conditions. For some of these conditions, such as seizures and sudden and severe muscle spasms, they are used only for a few hours or a few days. For other conditions, such as anxiety, sleep problems, or frequent muscle spasms or to help prevent seizures, they are used for an extended period, usually weeks or months. Benzodiazepines work by changing the way your brain functions. Normally, chemicals in your brain called neurotransmitters send messages between your brain cells. The neurotransmitter that benzodiazepines affect is called gamma-aminobutyric acid (GABA). GABA sends out messages that have a calming effect on many of the functions of your brain. Benzodiazepines make these messages stronger and increase this calming effect. Short-term use of benzodiazepines usually does not cause problems when you stop taking the drugs. However, if you take benzodiazepines for a long time, your body can adjust to the drug and require more of it to produce the same effect (drug tolerance). Eventually, you can develop physical dependence on benzodiazepines, which is when you experience negative effects if your dosage of benzodiazepines is reduced or stopped too quickly. These negative effects are called symptoms of withdrawal. SYMPTOMS Symptoms of withdrawal may begin anytime within the first 10 days after you stop taking the benzodiazepine. They can last from several weeks up to a few months but usually are the worst between the first 10 to 14 days.  The actual symptoms also vary, depending on the type of  benzodiazepine you take. Possible symptoms include:  Anxiety.  Excitability.  Irritability.  Depression.  Mood swings.  Trouble sleeping.  Confusion.  Uncontrollable shaking (tremors).  Muscle weakness.  Seizures. DIAGNOSIS To diagnose benzodiazepine withdrawal, your caregiver will examine you for certain signs, such as:  Rapid heartbeat.  Rapid breathing.  Tremors.  High blood pressure.  Fever.  Mood changes. Your caregiver also may ask the following questions about your use of benzodiazepines:  What type of benzodiazepine did you take?  How much did you take each day?  How long did you take the drug?  When was the last time you took the drug?  Do you take any other drugs?  Have you had alcohol recently?  Have you had a seizure recently?  Have you lost consciousness recently?  Have you had trouble remembering recent events?  Have you had a recent increase in anxiety, irritability, or trouble sleeping? A drug test also may be administered. TREATMENT The treatment for benzodiazepine withdrawal can vary, depending on the type and severity of your symptoms, what type of benzodiazepine you have been taking, and how long you have been taking the benzodiazepine. Sometimes it is necessary for you to be treated in a hospital, especially if you are at risk of seizures.  Often, treatment includes a prescription for a long-acting benzodiazepine, the dosage of which is reduced slowly over a long period. This period could be several weeks or months. Eventually, your dosage will be reduced to a point that you can stop taking the drug, without experiencing withdrawal symptoms. This is called tapered withdrawal. Occasionally, minor symptoms of withdrawal continue for a few days or weeks after you have  completed a tapered withdrawal. SEEK IMMEDIATE MEDICAL CARE IF:  You have a seizure.  You develop a craving for drugs or alcohol.  You begin to experience symptoms of  withdrawal during your tapered withdrawal.  You become very confused.  You lose consciousness.  You have trouble breathing.  You think about hurting yourself or someone else. Document Released: 02/07/2011 Document Revised: 05/13/2011 Document Reviewed: 02/07/2011 [facility] Patient Information 2015 [place], [place]. This information is not intended to replace advice given to you by your health care provider. Make sure you discuss any questions you have with your health care provider.

## 2014-11-21 NOTE — ED Notes (Signed)
Pt stable, ambulatory, states understanding of discharge instructions 

## 2014-11-21 NOTE — Telephone Encounter (Signed)
No. i am not going to prescribe this patient any more controlled medications. To many recurrent violations.

## 2014-11-23 ENCOUNTER — Telehealth: Payer: Self-pay

## 2014-11-23 NOTE — Telephone Encounter (Signed)
Printed out patients records and put in pick up draw at 102 with a ROI attached for patient to fill out.

## 2014-11-23 NOTE — Telephone Encounter (Signed)
Patient called into Medical Records today requesting his records to be printed out so he can pick them up. Stated he is in some kind of disagreement with a Dr. here (wound not give the name) about his narcotics. He stated that he needs to see the DEA report showing where and when and from what doctor he got his narcotics from so that he can prove that he did not get medicine from this Dr. while he was in a pain contract with someone (again would not give any names). I will print out my side of his medical records but I do not have access to the DEA report and am not sure if we can even give that to the patient so I would like someone from Clinical to address this and handle that side of it. Thank you!  Patient stated that he would come by on 11/23/14 to fill out ROI for his records.

## 2014-11-24 NOTE — Telephone Encounter (Signed)
Thank you for clarifying this, I have printed out the patients records and they are in the pick up draw at 102 waiting for him.

## 2014-11-24 NOTE — Telephone Encounter (Signed)
Patient can have is records from our office which will confirm that he did not receive any narcotics from [facility] at his recent visit.  All pain management providers have access to the [place] Controlled Substance Registry; thus, we do not need to print this off for him.

## 2015-01-08 ENCOUNTER — Other Ambulatory Visit: Payer: Self-pay | Admitting: Family Medicine

## 2015-01-19 ENCOUNTER — Other Ambulatory Visit: Payer: Self-pay | Admitting: Physician Assistant

## 2015-02-11 ENCOUNTER — Emergency Department (HOSPITAL_COMMUNITY)
Admission: EM | Admit: 2015-02-11 | Discharge: 2015-02-11 | Disposition: A | Discharge status: Home / Self Care | Payer: Self-pay | Attending: Emergency Medicine | Admitting: Emergency Medicine

## 2015-02-11 ENCOUNTER — Emergency Department (HOSPITAL_COMMUNITY): Payer: Self-pay

## 2015-02-11 ENCOUNTER — Encounter (HOSPITAL_COMMUNITY): Payer: Self-pay | Admitting: Emergency Medicine

## 2015-02-11 ENCOUNTER — Emergency Department (HOSPITAL_COMMUNITY): Payer: No Typology Code available for payment source

## 2015-02-11 DIAGNOSIS — G894 Chronic pain syndrome: Secondary | ICD-10-CM

## 2015-02-11 DIAGNOSIS — Z880 Allergy status to penicillin: Secondary | ICD-10-CM | POA: Insufficient documentation

## 2015-02-11 DIAGNOSIS — Z8739 Personal history of other diseases of the musculoskeletal system and connective tissue: Secondary | ICD-10-CM | POA: Insufficient documentation

## 2015-02-11 DIAGNOSIS — Z8669 Personal history of other diseases of the nervous system and sense organs: Secondary | ICD-10-CM | POA: Insufficient documentation

## 2015-02-11 DIAGNOSIS — Z79899 Other long term (current) drug therapy: Secondary | ICD-10-CM | POA: Insufficient documentation

## 2015-02-11 DIAGNOSIS — R000 Tachycardia, unspecified: Secondary | ICD-10-CM | POA: Insufficient documentation

## 2015-02-11 DIAGNOSIS — R61 Generalized hyperhidrosis: Secondary | ICD-10-CM | POA: Insufficient documentation

## 2015-02-11 DIAGNOSIS — R112 Nausea with vomiting, unspecified: Secondary | ICD-10-CM | POA: Insufficient documentation

## 2015-02-11 DIAGNOSIS — E669 Obesity, unspecified: Secondary | ICD-10-CM | POA: Insufficient documentation

## 2015-02-11 DIAGNOSIS — F410 Panic disorder [episodic paroxysmal anxiety] without agoraphobia: Secondary | ICD-10-CM | POA: Insufficient documentation

## 2015-02-11 DIAGNOSIS — I10 Essential (primary) hypertension: Secondary | ICD-10-CM | POA: Insufficient documentation

## 2015-02-11 DIAGNOSIS — R0602 Shortness of breath: Secondary | ICD-10-CM | POA: Insufficient documentation

## 2015-02-11 DIAGNOSIS — R079 Chest pain, unspecified: Secondary | ICD-10-CM | POA: Insufficient documentation

## 2015-02-11 DIAGNOSIS — K219 Gastro-esophageal reflux disease without esophagitis: Secondary | ICD-10-CM | POA: Insufficient documentation

## 2015-02-11 DIAGNOSIS — F329 Major depressive disorder, single episode, unspecified: Secondary | ICD-10-CM | POA: Insufficient documentation

## 2015-02-11 LAB — CBC
HCT: 46.1 % (ref 39.0–52.0)
Hemoglobin: 15 g/dL (ref 13.0–17.0)
MCH: 28.5 pg (ref 26.0–34.0)
MCHC: 32.5 g/dL (ref 30.0–36.0)
MCV: 87.6 fL (ref 78.0–100.0)
Platelets: 189 K/uL (ref 150–400)
RBC: 5.26 MIL/uL (ref 4.22–5.81)
RDW: 13 % (ref 11.5–15.5)
WBC: 8.3 K/uL (ref 4.0–10.5)

## 2015-02-11 LAB — BASIC METABOLIC PANEL
ANION GAP: 6 (ref 5–15)
BUN: 6 mg/dL (ref 6–20)
CALCIUM: 9.2 mg/dL (ref 8.9–10.3)
CO2: 30 mmol/L (ref 22–32)
Chloride: 103 mmol/L (ref 101–111)
Creatinine, Ser: 0.96 mg/dL (ref 0.61–1.24)
Glucose, Bld: 158 mg/dL — ABNORMAL HIGH (ref 65–99)
POTASSIUM: 3.6 mmol/L (ref 3.5–5.1)
Sodium: 139 mmol/L (ref 135–145)

## 2015-02-11 LAB — I-STAT TROPONIN, ED: TROPONIN I, POC: 0 ng/mL (ref 0.00–0.08)

## 2015-02-11 LAB — D-DIMER, QUANTITATIVE: D-Dimer, Quant: 0.62 ug{FEU}/mL — ABNORMAL HIGH (ref 0.00–0.50)

## 2015-02-11 MED ORDER — TRAMADOL HCL 50 MG PO TABS
100.0000 mg | ORAL_TABLET | Freq: Once | ORAL | Status: AC
  Administered 2015-02-11: 100 mg via ORAL
  Filled 2015-02-11: qty 2

## 2015-02-11 MED ORDER — IOHEXOL 350 MG/ML SOLN
100.0000 mL | Freq: Once | INTRAVENOUS | Status: AC | PRN
Start: 2015-02-11 — End: 2015-02-11
  Administered 2015-02-11: 100 mL via INTRAVENOUS

## 2015-02-11 MED ORDER — CLONIDINE HCL 0.3 MG PO TABS: 0.3000 mg | ORAL_TABLET | Freq: Two times a day (BID) | ORAL | Status: DC

## 2015-02-11 MED ORDER — TRAMADOL HCL 50 MG PO TABS: 50.0000 mg | ORAL_TABLET | Freq: Once | ORAL | Status: DC

## 2015-02-11 MED ORDER — RANITIDINE HCL 150 MG PO TABS
150.0000 mg | ORAL_TABLET | Freq: Every day | ORAL | Status: DC | PRN
Start: 2015-02-11 — End: 2015-07-30

## 2015-02-11 MED ORDER — LISINOPRIL 20 MG PO TABS: 20.0000 mg | ORAL_TABLET | Freq: Every morning | ORAL | Status: DC

## 2015-02-11 MED ORDER — TRAMADOL HCL 50 MG PO TABS: 100.0000 mg | ORAL_TABLET | Freq: Four times a day (QID) | ORAL | Status: DC | PRN

## 2015-02-11 MED ORDER — ACETAMINOPHEN 325 MG PO TABS
650.0000 mg | ORAL_TABLET | Freq: Once | ORAL | Status: AC
  Administered 2015-02-11: 650 mg via ORAL
  Filled 2015-02-11: qty 2

## 2015-02-11 MED ORDER — CLONIDINE HCL 0.1 MG PO TABS
0.3000 mg | ORAL_TABLET | Freq: Once | ORAL | Status: AC
  Administered 2015-02-11: 0.3 mg via ORAL
  Filled 2015-02-11: qty 3

## 2015-02-11 NOTE — ED Notes (Signed)
Patient returned from CT

## 2015-02-11 NOTE — ED Provider Notes (Signed)
CSN: [id number]     Arrival date & time 02/11/15  0119 History  By signing my name below, I, Emmanuella Mensah, attest that this documentation has been prepared under the direction and in the presence of Dione Boozeavid Evey Mcmahan, MD. Electronically Signed: Angelene GiovanniEmmanuella Mensah, ED Scribe. 02/11/2015. 2:17 AM.    Chief Complaint  Patient presents with  . Chest Pain   The history is provided by the patient. No language interpreter was used.   HPI Comments: Travis [name] is a 49 y.o. male with a hx of HTN who presents to the Emergency Department complaining of gradually worsening 10/10 sharp left CP that radiates down his left arm onset 3 days ago. He reports associated n/v, SOB, and diaphoresis. No alleviating factors noted. He reports that he lost his medication 3 days ago when he went to [place] for his grandmother's funeral. He states that his trip was a 12 hour round trip in a car. He notes that he has an upcoming appointment with his PCP to replace his medication. Pt reports that he is currently on Clonidine, lisinopril, tramadol, lopressor, and ranitidine. He reports that he quit smoking approx. 6 months ago.    Past Medical History  Diagnosis Date  . Hypertension   . Tachycardia - pulse   . Degenerative disk disease   . Spinal stenosis   . Degenerative disk disease   . Tobacco abuse   . Obesity   . Polysubstance abuse   . Chest pain     Hospital, March, 2014, negative enzymes, patient refused in-hospital  stress test, patient canceled outpatient stress test  . Spinal stenosis   . Depression   . Neuromuscular disorder (HCC)   . Anxiety     Panic attacks  . Shortness of breath dyspnea     with exertion  . PTSD (post-traumatic stress disorder)   . Incontinence of urine   . GERD (gastroesophageal reflux disease)   . Constipation    Past Surgical History  Procedure Laterality Date  . Wisdom tooth extraction    . Esophagogastroduodenoscopy (egd) with propofol N/A 08/24/2014    Procedure:  ESOPHAGOGASTRODUODENOSCOPY (EGD) WITH PROPOFOL;  Surgeon: Charlott RakesVincent Schooler, MD;  [facility];  Service: Endoscopy;  Laterality: N/A;  . Colonoscopy N/A 08/24/2014    Procedure: COLONOSCOPY;  Surgeon: Charlott RakesVincent Schooler, MD;  [facility];  Service: Endoscopy;  Laterality: N/A;   Family History  Problem Relation Age of Onset  . Stroke Mother   . Hypertension Mother   . Hyperlipidemia Mother   . Stroke Father   . Hypertension Father   . Dementia Father   . Diabetes Father   . Heart disease Father   . Hyperlipidemia Father   . Cancer Sister     brain  . Diabetes Sister   . Heart disease Sister   . Hyperlipidemia Sister   . Hypertension Sister   . Stroke Sister   . Heart disease Brother   . Hyperlipidemia Brother    Social History  Substance Use Topics  . Smoking status: Former Smoker -- 0.02 packs/day for 27 years    Types: Cigarettes  . Smokeless tobacco: Never Used     Comment: "quit April 2016, I havwe one every now and then"  . Alcohol Use: No    Review of Systems  Constitutional: Positive for diaphoresis. Negative for fever and chills.  Respiratory: Positive for shortness of breath.   Cardiovascular: Positive for chest pain.  Gastrointestinal: Positive for nausea and vomiting.  All other systems  reviewed and are negative.     Allergies  Blueberry flavor; Penicillins; Robaxin; Flexeril; and Toradol  Home Medications   Prior to Admission medications   Medication Sig Start Date End Date Taking? Authorizing Provider  cloNIDine (CATAPRES) 0.3 MG tablet Take 1 tablet (0.3 mg total) by mouth 2 (two) times daily. PATIENT NEEDS OFFICE VISIT FOR ADDITIONAL REFILLS 01/20/15  Yes Tishira R Brewington, PA-C  diazepam (VALIUM) 5 MG tablet Take 1 tablet (5 mg total) by mouth every 8 (eight) hours as needed for muscle spasms. 11/21/14  Yes Shon Baton, MD  ibuprofen (ADVIL,MOTRIN) 200 MG tablet Take 600 mg by mouth every 6 (six) hours as needed for  moderate pain (For pain.).    Yes Historical Provider, MD  lisinopril (PRINIVIL,ZESTRIL) 20 MG tablet Take 1 tablet (20 mg total) by mouth every morning. For high blood pressure 05/16/14  Yes Thao P Le, DO  ranitidine (ZANTAC) 150 MG tablet Take 150 mg by mouth daily as needed for heartburn.   Yes Historical Provider, MD  traMADol (ULTRAM) 50 MG tablet Take 2 tablets (100 mg total) by mouth every 6 (six) hours as needed. Do not use >8 tabs/day. Patient taking differently: Take 100 mg by mouth every 6 (six) hours as needed (For pain.). Do not use >8 tabs/day. 06/16/14  Yes Sherren Mocha, MD  chlordiazePOXIDE (LIBRIUM) 25 MG capsule  PO TID x 1D, then 25-50mg  PO BID X 1D, then 25-50mg  PO QD X 1D Patient not taking: Reported on 02/11/2015 10/30/14   Marisa Severin, MD  metoprolol (LOPRESSOR) 50 MG tablet Take 1 tablet (50 mg total) by mouth 2 (two) times daily. For high blood pressure Patient not taking: Reported on 10/30/2014 05/16/14   Thao P Le, DO   BP 168/106 mmHg  Pulse 108  Temp(Src) 97.7 F (36.5 C) (Oral)  Resp 24  SpO2 100% Physical Exam  Constitutional: He is oriented to person, place, and time. He appears well-developed and well-nourished. No distress.  HENT:  Head: Normocephalic and atraumatic.  Eyes: Conjunctivae and EOM are normal. Pupils are equal, round, and reactive to light.  Neck: Normal range of motion. Neck supple. No JVD present.  Cardiovascular: Normal rate, regular rhythm and normal heart sounds.   No murmur heard. Tachycardiac   Pulmonary/Chest: Effort normal and breath sounds normal. He has no wheezes. He has no rales. He exhibits no tenderness.  Abdominal: Soft. Bowel sounds are normal. He exhibits no distension and no mass. There is no tenderness.  Musculoskeletal: Normal range of motion. He exhibits no edema.  Lymphadenopathy:    He has no cervical adenopathy.  Neurological: He is alert and oriented to person, place, and time. No cranial nerve deficit. He exhibits  normal muscle tone. Coordination normal.  Skin: Skin is warm and dry. No rash noted.  Psychiatric: He has a normal mood and affect. His behavior is normal. Judgment and thought content normal.  Nursing note and vitals reviewed.   ED Course  Procedures (including critical care time) DIAGNOSTIC STUDIES: Oxygen Saturation is 100% on RA, normal by my interpretation.    COORDINATION OF CARE: 2:15 AM- Pt advised of plan for treatment and pt agrees. Pt will receive D-dimer due to his recent long car trip and if positive, will receive a CAT scan. Pt will receive a dose of tramadol and clonidine.    Labs Review Results for orders placed or performed during the hospital encounter of 02/11/15  Basic metabolic panel  Result Value Ref Range  Sodium 139 135 - 145 mmol/L   Potassium 3.6 3.5 - 5.1 mmol/L   Chloride 103 101 - 111 mmol/L   CO2 30 22 - 32 mmol/L   Glucose, Bld 158 (H) 65 - 99 mg/dL   BUN 6 6 - 20 mg/dL   Creatinine, Ser 2.95 0.61 - 1.24 mg/dL   Calcium 9.2 8.9 - 62.1 mg/dL   GFR calc non Af Amer >60 >60 mL/min   GFR calc Af Amer >60 >60 mL/min   Anion gap 6 5 - 15  CBC  Result Value Ref Range   WBC 8.3 4.0 - 10.5 K/uL   RBC 5.26 4.22 - 5.81 MIL/uL   Hemoglobin 15.0 13.0 - 17.0 g/dL   HCT 30.8 65.7 - 84.6 %   MCV 87.6 78.0 - 100.0 fL   MCH 28.5 26.0 - 34.0 pg   MCHC 32.5 30.0 - 36.0 g/dL   RDW 96.2 95.2 - 84.1 %   Platelets 189 150 - 400 K/uL  D-dimer, quantitative  Result Value Ref Range   D-Dimer, Quant 0.62 (H) 0.00 - 0.50 ug/mL-FEU  I-stat troponin, ED (not at [facility])  Result Value Ref Range   Troponin i, poc 0.00 0.00 - 0.08 ng/mL   Comment 3            Imaging Review Dg Chest 2 View  02/11/2015  CLINICAL DATA:  Sharp LEFT chest pain radiating to LEFT arm and back for 2 hours, nausea, diarrhea and shortness of breath. History of hypertension, tobacco abuse. EXAM: CHEST  2 VIEW COMPARISON:  Chest radiograph March 28, 2014 FINDINGS: Cardiomediastinal  silhouette is normal. Linear scarring in lingula. The lungs are clear without pleural effusions or focal consolidations. Trachea projects midline and there is no pneumothorax. The nodular density projecting in the upper lung on prior examination is not apparent on today's study. Soft tissue planes and included osseous structures are non-suspicious. Slightly accentuated thoracolumbar kyphosis. IMPRESSION: Lingular scarring, no acute cardiopulmonary process. Electronically Signed   By: Awilda Metro M.D.   On: 02/11/2015 02:07   Ct Angio Chest Pe W/cm &/or Wo Cm  02/11/2015  CLINICAL DATA:  Acute onset of sharp left-sided chest pain, radiating down the left arm, with back pain. Nausea, diarrhea and shortness of breath. Elevated D-dimer. Initial encounter. EXAM: CT ANGIOGRAPHY CHEST WITH CONTRAST TECHNIQUE: Multidetector CT imaging of the chest was performed using the standard protocol during bolus administration of intravenous contrast. Multiplanar CT image reconstructions and MIPs were obtained to evaluate the vascular anatomy. CONTRAST:  OMNIPAQUE IOHEXOL 350 MG/ML SOLN COMPARISON:  Chest radiograph performed earlier today at 1:54 a.m. FINDINGS: There is no evidence of pulmonary embolus. Minimal bibasilar atelectasis or scarring is noted. Scattered calcified granulomata are noted within the right lung. There is no evidence of significant focal consolidation, pleural effusion or pneumothorax. No masses are identified; no abnormal focal contrast enhancement is seen. The mediastinum is unremarkable in appearance. No mediastinal lymphadenopathy is seen. No pericardial effusion is identified. The great vessels are grossly unremarkable in appearance. No axillary lymphadenopathy is seen. The visualized portions of the thyroid gland are unremarkable in appearance. The visualized portions of the liver and spleen are unremarkable. The visualized portions of the pancreas, gallbladder, stomach, adrenal glands and  right kidney are within normal limits. No acute osseous abnormalities are seen. Review of the MIP images confirms the above findings. IMPRESSION: 1. No evidence of pulmonary embolus. 2. Minimal bibasilar atelectasis or scarring noted. Few calcified granulomata noted within  the right lung. Electronically Signed   By: Roanna Raider M.D.   On: 02/11/2015 04:25     Dione Booze, MD has personally reviewed and evaluated these images and lab results as part of his medical decision-making.   EKG Interpretation   Date/Time:  Saturday February 11 2015 01:24:59 EST Ventricular Rate:  115 PR Interval:  169 QRS Duration: 92 QT Interval:  340 QTC Calculation: 470 R Axis:   -8 Text Interpretation:  Sinus tachycardia Probable left atrial enlargement  Baseline wander in lead(s) V2 When compared with ECG of 11/21/2014, HEART  RATE has increased Confirmed by [facility]  MD, Omario Ander ([id number]) on 02/11/2015  1:35:16 AM      MDM   Final diagnoses:  Nonspecific chest pain    Chest pain of uncertain cause. Patient is noted to be tachycardic and tachypnea And had recent car ride to 6 hours so is at risk for pulmonary embolism. D-dimer is obtained to screen for pulmonary embolism.  D-dimer is come back slightly elevated. He is sent for CT angiogram which showed no evidence of pulmonary embolism. He is discharged with prescriptions for his tramadol, ranitidine, lisinopril, and clonidine and amount sufficient to last until he had has is scheduled physician's appointment in 5 days.  I personally performed the services described in this documentation, which was scribed in my presence. The recorded information has been reviewed and is accurate.      Dione Booze, MD 02/11/15 [phone]

## 2015-02-11 NOTE — Discharge Instructions (Signed)
Nonspecific Chest Pain  °Chest pain can be caused by many different conditions. There is always a chance that your pain could be related to something serious, such as a heart attack or a blood clot in your lungs. Chest pain can also be caused by conditions that are not life-threatening. If you have chest pain, it is very important to follow up with your health care provider. °CAUSES  °Chest pain can be caused by: °· Heartburn. °· Pneumonia or bronchitis. °· Anxiety or stress. °· Inflammation around your heart (pericarditis) or lung (pleuritis or pleurisy). °· A blood clot in your lung. °· A collapsed lung (pneumothorax). It can develop suddenly on its own (spontaneous pneumothorax) or from trauma to the chest. °· Shingles infection (varicella-zoster virus). °· Heart attack. °· Damage to the bones, muscles, and cartilage that make up your chest wall. This can include: °¨ Bruised bones due to injury. °¨ Strained muscles or cartilage due to frequent or repeated coughing or overwork. °¨ Fracture to one or more ribs. °¨ Sore cartilage due to inflammation (costochondritis). °RISK FACTORS  °Risk factors for chest pain may include: °· Activities that increase your risk for trauma or injury to your chest. °· Respiratory infections or conditions that cause frequent coughing. °· Medical conditions or overeating that can cause heartburn. °· Heart disease or family history of heart disease. °· Conditions or health behaviors that increase your risk of developing a blood clot. °· Having had chicken pox (varicella zoster). °SIGNS AND SYMPTOMS °Chest pain can feel like: °· Burning or tingling on the surface of your chest or deep in your chest. °· Crushing, pressure, aching, or squeezing pain. °· Dull or sharp pain that is worse when you move, cough, or take a deep breath. °· Pain that is also felt in your back, neck, shoulder, or arm, or pain that spreads to any of these areas. °Your chest pain may come and go, or it may stay  constant. °DIAGNOSIS °Lab tests or other studies may be needed to find the cause of your pain. Your health care provider may have you take a test called an ambulatory ECG (electrocardiogram). An ECG records your heartbeat patterns at the time the test is performed. You may also have other tests, such as: °· Transthoracic echocardiogram (TTE). During echocardiography, sound waves are used to create a picture of all of the heart structures and to look at how blood flows through your heart. °· Transesophageal echocardiogram (TEE). This is a more advanced imaging test that obtains images from inside your body. It allows your health care provider to see your heart in finer detail. °· Cardiac monitoring. This allows your health care provider to monitor your heart rate and rhythm in real time. °· Holter monitor. This is a portable device that records your heartbeat and can help to diagnose abnormal heartbeats. It allows your health care provider to track your heart activity for several days, if needed. °· Stress tests. These can be done through exercise or by taking medicine that makes your heart beat more quickly. °· Blood tests. °· Imaging tests. °TREATMENT  °Your treatment depends on what is causing your chest pain. Treatment may include: °· Medicines. These may include: °¨ Acid blockers for heartburn. °¨ Anti-inflammatory medicine. °¨ Pain medicine for inflammatory conditions. °¨ Antibiotic medicine, if an infection is present. °¨ Medicines to dissolve blood clots. °¨ Medicines to treat coronary artery disease. °· Supportive care for conditions that do not require medicines. This may include: °¨ Resting. °¨ Applying heat   or cold packs to injured areas. °¨ Limiting activities until pain decreases. °HOME CARE INSTRUCTIONS °· If you were prescribed an antibiotic medicine, finish it all even if you start to feel better. °· Avoid any activities that bring on chest pain. °· Do not use any tobacco products, including  cigarettes, chewing tobacco, or electronic cigarettes. If you need help quitting, ask your health care provider. °· Do not drink alcohol. °· Take medicines only as directed by your health care provider. °· Keep all follow-up visits as directed by your health care provider. This is important. This includes any further testing if your chest pain does not go away. °· If heartburn is the cause for your chest pain, you may be told to keep your head raised (elevated) while sleeping. This reduces the chance that acid will go from your stomach into your esophagus. °· Make lifestyle changes as directed by your health care provider. These may include: °¨ Getting regular exercise. Ask your health care provider to suggest some activities that are safe for you. °¨ Eating a heart-healthy diet. A registered dietitian can help you to learn healthy eating options. °¨ Maintaining a healthy weight. °¨ Managing diabetes, if necessary. °¨ Reducing stress. °SEEK MEDICAL CARE IF: °· Your chest pain does not go away after treatment. °· You have a rash with blisters on your chest. °· You have a fever. °SEEK IMMEDIATE MEDICAL CARE IF:  °· Your chest pain is worse. °· You have an increasing cough, or you cough up blood. °· You have severe abdominal pain. °· You have severe weakness. °· You faint. °· You have chills. °· You have sudden, unexplained chest discomfort. °· You have sudden, unexplained discomfort in your arms, back, neck, or jaw. °· You have shortness of breath at any time. °· You suddenly start to sweat, or your skin gets clammy. °· You feel nauseous or you vomit. °· You suddenly feel light-headed or dizzy. °· Your heart begins to beat quickly, or it feels like it is skipping beats. °These symptoms may represent a serious problem that is an emergency. Do not wait to see if the symptoms will go away. Get medical help right away. Call your local emergency services (911 in the [place] Do not drive yourself to the hospital. °  °This  information is not intended to replace advice given to you by your health care provider. Make sure you discuss any questions you have with your health care provider. °  °Document Released: 11/28/2004 Document Revised: 03/11/2014 Document Reviewed: 09/24/2013 °[employer] Interactive Patient Education ©2016 [employer] ° °

## 2015-02-11 NOTE — ED Notes (Addendum)
Pt c/o L sided CP, sharp, radiating down L arm with back pain x 2 hours. +nausea, diarrhea and SHOB

## 2015-02-13 ENCOUNTER — Other Ambulatory Visit: Payer: Self-pay | Admitting: Family Medicine

## 2015-02-14 ENCOUNTER — Telehealth: Payer: Self-pay

## 2015-02-14 NOTE — Telephone Encounter (Signed)
Rx sent in

## 2015-02-14 NOTE — Telephone Encounter (Signed)
Pt states it is an emergency he speak with someone regarding his bp, advised pt to come in but he would prefer a call back Please call [phone]

## 2015-03-07 ENCOUNTER — Other Ambulatory Visit: Payer: Self-pay | Admitting: Family Medicine

## 2015-03-13 ENCOUNTER — Emergency Department (HOSPITAL_COMMUNITY)
Admission: EM | Admit: 2015-03-13 | Discharge: 2015-03-13 | Disposition: A | Discharge status: Home / Self Care | Payer: No Typology Code available for payment source | Attending: Emergency Medicine | Admitting: Emergency Medicine

## 2015-03-13 ENCOUNTER — Emergency Department (HOSPITAL_COMMUNITY): Payer: No Typology Code available for payment source

## 2015-03-13 ENCOUNTER — Encounter (HOSPITAL_COMMUNITY): Payer: Self-pay

## 2015-03-13 DIAGNOSIS — Z79899 Other long term (current) drug therapy: Secondary | ICD-10-CM | POA: Insufficient documentation

## 2015-03-13 DIAGNOSIS — M79604 Pain in right leg: Secondary | ICD-10-CM | POA: Insufficient documentation

## 2015-03-13 DIAGNOSIS — E669 Obesity, unspecified: Secondary | ICD-10-CM | POA: Insufficient documentation

## 2015-03-13 DIAGNOSIS — Z87891 Personal history of nicotine dependence: Secondary | ICD-10-CM | POA: Insufficient documentation

## 2015-03-13 DIAGNOSIS — F419 Anxiety disorder, unspecified: Secondary | ICD-10-CM | POA: Insufficient documentation

## 2015-03-13 DIAGNOSIS — R4781 Slurred speech: Secondary | ICD-10-CM | POA: Insufficient documentation

## 2015-03-13 DIAGNOSIS — I10 Essential (primary) hypertension: Secondary | ICD-10-CM | POA: Insufficient documentation

## 2015-03-13 DIAGNOSIS — M79605 Pain in left leg: Secondary | ICD-10-CM | POA: Insufficient documentation

## 2015-03-13 DIAGNOSIS — K219 Gastro-esophageal reflux disease without esophagitis: Secondary | ICD-10-CM | POA: Insufficient documentation

## 2015-03-13 DIAGNOSIS — R200 Anesthesia of skin: Secondary | ICD-10-CM | POA: Insufficient documentation

## 2015-03-13 DIAGNOSIS — F1310 Sedative, hypnotic or anxiolytic abuse, uncomplicated: Secondary | ICD-10-CM | POA: Insufficient documentation

## 2015-03-13 DIAGNOSIS — Z880 Allergy status to penicillin: Secondary | ICD-10-CM | POA: Insufficient documentation

## 2015-03-13 DIAGNOSIS — R55 Syncope and collapse: Secondary | ICD-10-CM | POA: Insufficient documentation

## 2015-03-13 DIAGNOSIS — Z8669 Personal history of other diseases of the nervous system and sense organs: Secondary | ICD-10-CM | POA: Insufficient documentation

## 2015-03-13 DIAGNOSIS — R531 Weakness: Secondary | ICD-10-CM | POA: Insufficient documentation

## 2015-03-13 DIAGNOSIS — J029 Acute pharyngitis, unspecified: Secondary | ICD-10-CM | POA: Insufficient documentation

## 2015-03-13 LAB — RAPID URINE DRUG SCREEN, HOSP PERFORMED
Amphetamines: NOT DETECTED
BENZODIAZEPINES: POSITIVE — AB
Barbiturates: NOT DETECTED
Cocaine: NOT DETECTED
Opiates: NOT DETECTED
Tetrahydrocannabinol: NOT DETECTED

## 2015-03-13 LAB — URINALYSIS, ROUTINE W REFLEX MICROSCOPIC
Bilirubin Urine: NEGATIVE
Glucose, UA: >1000 mg/dL — AB
Hgb urine dipstick: NEGATIVE
Ketones, ur: NEGATIVE mg/dL
Leukocytes, UA: NEGATIVE
NITRITE: NEGATIVE
Protein, ur: NEGATIVE mg/dL
pH: 5.5 (ref 5.0–8.0)

## 2015-03-13 LAB — COMPREHENSIVE METABOLIC PANEL
ALT: 26 U/L (ref 17–63)
AST: 31 U/L (ref 15–41)
Albumin: 3.1 g/dL — ABNORMAL LOW (ref 3.5–5.0)
Alkaline Phosphatase: 101 U/L (ref 38–126)
Anion gap: 10 (ref 5–15)
BUN: 6 mg/dL (ref 6–20)
CHLORIDE: 93 mmol/L — AB (ref 101–111)
CO2: 28 mmol/L (ref 22–32)
CREATININE: 1.04 mg/dL (ref 0.61–1.24)
Calcium: 9 mg/dL (ref 8.9–10.3)
Glucose, Bld: 248 mg/dL — ABNORMAL HIGH (ref 65–99)
Potassium: 3.8 mmol/L (ref 3.5–5.1)
Sodium: 131 mmol/L — ABNORMAL LOW (ref 135–145)
Total Bilirubin: 0.3 mg/dL (ref 0.3–1.2)
Total Protein: 6.8 g/dL (ref 6.5–8.1)

## 2015-03-13 LAB — I-STAT CHEM 8, ED
BUN: 7 mg/dL (ref 6–20)
CALCIUM ION: 1.21 mmol/L (ref 1.12–1.23)
Chloride: 93 mmol/L — ABNORMAL LOW (ref 101–111)
Creatinine, Ser: 0.9 mg/dL (ref 0.61–1.24)
GLUCOSE: 243 mg/dL — AB (ref 65–99)
HCT: 44 % (ref 39.0–52.0)
HEMOGLOBIN: 15 g/dL (ref 13.0–17.0)
Potassium: 3.7 mmol/L (ref 3.5–5.1)
Sodium: 136 mmol/L (ref 135–145)
TCO2: 30 mmol/L (ref 0–100)

## 2015-03-13 LAB — I-STAT TROPONIN, ED: TROPONIN I, POC: 0.01 ng/mL (ref 0.00–0.08)

## 2015-03-13 LAB — DIFFERENTIAL
BASOS PCT: 1 %
Basophils Absolute: 0 10*3/uL (ref 0.0–0.1)
EOS ABS: 0.3 10*3/uL (ref 0.0–0.7)
Eosinophils Relative: 5 %
Lymphocytes Relative: 40 %
Lymphs Abs: 2.4 10*3/uL (ref 0.7–4.0)
MONO ABS: 0.5 10*3/uL (ref 0.1–1.0)
MONOS PCT: 8 %
NEUTROS ABS: 2.8 10*3/uL (ref 1.7–7.7)
Neutrophils Relative %: 46 %

## 2015-03-13 LAB — PROTIME-INR
INR: 1.2 (ref 0.00–1.49)
Prothrombin Time: 15.4 seconds — ABNORMAL HIGH (ref 11.6–15.2)

## 2015-03-13 LAB — CBC
HEMATOCRIT: 40.9 % (ref 39.0–52.0)
Hemoglobin: 13.4 g/dL (ref 13.0–17.0)
MCH: 28.6 pg (ref 26.0–34.0)
MCHC: 32.8 g/dL (ref 30.0–36.0)
MCV: 87.2 fL (ref 78.0–100.0)
Platelets: 146 10*3/uL — ABNORMAL LOW (ref 150–400)
RBC: 4.69 MIL/uL (ref 4.22–5.81)
RDW: 13.2 % (ref 11.5–15.5)
WBC: 6 10*3/uL (ref 4.0–10.5)

## 2015-03-13 LAB — URINE MICROSCOPIC-ADD ON

## 2015-03-13 LAB — ETHANOL

## 2015-03-13 LAB — APTT: aPTT: 31 seconds (ref 24–37)

## 2015-03-13 NOTE — Discharge Instructions (Signed)

## 2015-03-13 NOTE — ED Notes (Signed)
Per EMS: Hx spinal stenosis. L foot numbness. LSN 1330, took a tramadol 1415, stepped up and felt a "pop" and began experiencing leg weakness. Walked to bedroom and was unable to lift legs into bed. 150/110. 293 CBG. Pt taking mom's cipro for a sore throat last two days.

## 2015-03-13 NOTE — ED Provider Notes (Signed)
CSN: [id number]     Arrival date & time 03/13/15  1715 History   First MD Initiated Contact with Patient 03/13/15 1731     Chief Complaint  Patient presents with  . Weakness   HPI Pt remembers everything being OK at around 1:30pm however his family member noticed that his speech was slurred around 10 am..  He thinks around 230 pm he was cleaning out his room and as he went to sit down he did not feel like his legs were responding properly. Both legs were effected.  He was also having pain in his legs.  He has history of spinal stenosis.  It has caused numbness in the "saddle area".  It has also caused numbness in his lower legs.  No fevers or chills.  NO headache.  No arm weakness. Past Medical History  Diagnosis Date  . Hypertension   . Tachycardia - pulse   . Degenerative disk disease   . Spinal stenosis   . Degenerative disk disease   . Tobacco abuse   . Obesity   . Polysubstance abuse   . Chest pain     Hospital, March, 2014, negative enzymes, patient refused in-hospital  stress test, patient canceled outpatient stress test  . Spinal stenosis   . Depression   . Neuromuscular disorder (HCC)   . Anxiety     Panic attacks  . Shortness of breath dyspnea     with exertion  . PTSD (post-traumatic stress disorder)   . Incontinence of urine   . GERD (gastroesophageal reflux disease)   . Constipation    Past Surgical History  Procedure Laterality Date  . Wisdom tooth extraction    . Esophagogastroduodenoscopy (egd) with propofol N/A 08/24/2014    Procedure: ESOPHAGOGASTRODUODENOSCOPY (EGD) WITH PROPOFOL;  Surgeon: Charlott Rakes, MD;  Location: [facility] ENDOSCOPY;  Service: Endoscopy;  Laterality: N/A;  . Colonoscopy N/A 08/24/2014    Procedure: COLONOSCOPY;  Surgeon: Charlott Rakes, MD;  Location: [facility] ENDOSCOPY;  Service: Endoscopy;  Laterality: N/A;   Family History  Problem Relation Age of Onset  . Stroke Mother   . Hypertension Mother   . Hyperlipidemia Mother   . Stroke Father    . Hypertension Father   . Dementia Father   . Diabetes Father   . Heart disease Father   . Hyperlipidemia Father   . Cancer Sister     brain  . Diabetes Sister   . Heart disease Sister   . Hyperlipidemia Sister   . Hypertension Sister   . Stroke Sister   . Heart disease Brother   . Hyperlipidemia Brother    Social History  Substance Use Topics  . Smoking status: Former Smoker -- 0.02 packs/day for 27 years    Types: Cigarettes  . Smokeless tobacco: Never Used     Comment: "quit April 2016, I havwe one every now and then"  . Alcohol Use: No    Review of Systems  Constitutional: Negative for fever.  HENT: Positive for sore throat.   Gastrointestinal: Negative for abdominal pain.  Neurological: Positive for syncope (the other day). Negative for tremors.  All other systems reviewed and are negative.     Allergies  Blueberry flavor; Penicillins; Robaxin; Flexeril; and Toradol  Home Medications   Prior to Admission medications   Medication Sig Start Date End Date Taking? Authorizing Provider  cloNIDine (CATAPRES) 0.3 MG tablet TAKE ONE TABLET BY MOUTH TWICE DAILY 03/07/15  Yes Sherren Mocha, MD  diazepam (VALIUM) 5 MG tablet  Take 1 tablet (5 mg total) by mouth every 8 (eight) hours as needed for muscle spasms. 11/21/14  Yes Shon Batonourtney F Horton, MD  ibuprofen (ADVIL,MOTRIN) 200 MG tablet Take 600 mg by mouth every 6 (six) hours as needed for moderate pain (For pain.).    Yes Historical Provider, MD  lisinopril (PRINIVIL,ZESTRIL) 20 MG tablet Take 1 tablet (20 mg total) by mouth every morning. For high blood pressure 02/11/15  Yes Dione Boozeavid Glick, MD  ranitidine (ZANTAC) 150 MG tablet Take 1 tablet (150 mg total) by mouth daily as needed for heartburn. 02/11/15  Yes Dione Boozeavid Glick, MD  traMADol (ULTRAM) 50 MG tablet Take 2 tablets (100 mg total) by mouth every 6 (six) hours as needed. Do not use >8 tabs/day. Patient taking differently: Take 100 mg by mouth every 6 (six) hours as needed for  moderate pain. Do not use >8 tabs/day. 02/11/15  Yes Dione Boozeavid Glick, MD   BP 139/103 mmHg  Pulse 64  Temp(Src) 97.9 F (36.6 C) (Oral)  Resp 14  SpO2 99% Physical Exam  Constitutional: He is oriented to person, place, and time. No distress.  Listless   HENT:  Head: Normocephalic and atraumatic.  Right Ear: External ear normal.  Left Ear: External ear normal.  Mouth/Throat: Oropharynx is clear and moist.  Mm dry, erythema, no pustules   Eyes: Conjunctivae are normal. Right eye exhibits no discharge. Left eye exhibits no discharge. No scleral icterus.  Neck: Neck supple. No tracheal deviation present.  Cardiovascular: Normal rate, regular rhythm and intact distal pulses.   Pulmonary/Chest: Effort normal and breath sounds normal. No stridor. No respiratory distress. He has no wheezes. He has no rales.  Abdominal: Soft. Bowel sounds are normal. He exhibits no distension. There is no tenderness. There is no rebound and no guarding.  Musculoskeletal: He exhibits no edema or tenderness.  Neurological: He is alert and oriented to person, place, and time. No cranial nerve deficit (No facial droop, extraocular movements intact, tongue midline  , slurred speech,) or sensory deficit. He exhibits normal muscle tone. He displays no seizure activity. Coordination normal.  No pronator drift bilateral upper extrem, able to hold both legs off bed for 5 seconds, sensation intact in all extremities, no visual field cuts, no left or right sided neglect, abnormal finger-nose exam , slow movements with pass pointing, no nystagmus noted 4/5 strength bilateral ue and le  Skin: Skin is warm and dry. No rash noted.  Psychiatric: He has a normal mood and affect.  Nursing note and vitals reviewed.   ED Course  Procedures (including critical care time) Labs Review Labs Reviewed  PROTIME-INR - Abnormal; Notable for the following:    Prothrombin Time 15.4 (*)    All other components within normal limits  CBC -  Abnormal; Notable for the following:    Platelets 146 (*)    All other components within normal limits  COMPREHENSIVE METABOLIC PANEL - Abnormal; Notable for the following:    Sodium 131 (*)    Chloride 93 (*)    Glucose, Bld 248 (*)    Albumin 3.1 (*)    All other components within normal limits  URINE RAPID DRUG SCREEN, HOSP PERFORMED - Abnormal; Notable for the following:    Benzodiazepines POSITIVE (*)    All other components within normal limits  URINALYSIS, ROUTINE W REFLEX MICROSCOPIC (NOT AT [facility]) - Abnormal; Notable for the following:    Specific Gravity, Urine >1.030 (*)    Glucose, UA >1000 (*)  All other components within normal limits  URINE MICROSCOPIC-ADD ON - Abnormal; Notable for the following:    Squamous Epithelial / LPF 0-5 (*)    Bacteria, UA RARE (*)    Casts HYALINE CASTS (*)    Crystals CA OXALATE CRYSTALS (*)    All other components within normal limits  I-STAT CHEM 8, ED - Abnormal; Notable for the following:    Chloride 93 (*)    Glucose, Bld 243 (*)    All other components within normal limits  ETHANOL  APTT  DIFFERENTIAL  I-STAT TROPOININ, ED    Imaging Review Ct Head Wo Contrast  03/13/2015  CLINICAL DATA:  Weakness. Acute onset of left leg weakness and foot numbness. EXAM: CT HEAD WITHOUT CONTRAST TECHNIQUE: Contiguous axial images were obtained from the base of the skull through the vertex without intravenous contrast. COMPARISON:  Most recent head CT 10/21/2014 FINDINGS: No intracranial hemorrhage, mass effect, or midline shift. No hydrocephalus. The basilar cisterns are patent. No evidence of territorial infarct. No intracranial fluid collection. Calvarium is intact. Included paranasal sinuses and mastoid air cells are well aerated. IMPRESSION: No acute intracranial abnormality. Electronically Signed   By: Rubye Oaks M.D.   On: 03/13/2015 19:37   I have personally reviewed and evaluated these images and lab results as part of my medical  decision-making.   EKG Interpretation   Date/Time:  Monday March 13 2015 18:17:48 EST Ventricular Rate:  59 PR Interval:  192 QRS Duration: 101 QT Interval:  425 QTC Calculation: 421 R Axis:   -10 Text Interpretation:  Sinus rhythm Abnormal R-wave progression, early  transition Borderline T abnormalities, anterior leads Since last tracing  rate slower Confirmed by Ameisha Mcclellan  MD-J, Darren Caldron ([id number]) on 03/13/2015 6:21:18 PM      MDM   Final diagnoses:  Weakness    Pt's symptoms improved while he was in the ED.  He was able to get up and walk to the bathroom.  Labs and CT scan are reassuring.  I doubt TIA stroke.  Doubt cauda equina.    Some of his symptoms may be med related.  His on benzodiazepines.  At this time there does not appear to be any evidence of an acute emergency medical condition and the patient appears stable for discharge with appropriate outpatient follow up.    Linwood Dibbles, MD 03/13/15 2052

## 2015-03-17 ENCOUNTER — Encounter (HOSPITAL_COMMUNITY): Payer: Self-pay | Admitting: Emergency Medicine

## 2015-03-17 ENCOUNTER — Emergency Department (HOSPITAL_COMMUNITY)
Admission: EM | Admit: 2015-03-17 | Discharge: 2015-03-18 | Disposition: A | Discharge status: Home / Self Care | Payer: No Typology Code available for payment source | Attending: Emergency Medicine | Admitting: Emergency Medicine

## 2015-03-17 ENCOUNTER — Emergency Department (HOSPITAL_COMMUNITY): Payer: No Typology Code available for payment source

## 2015-03-17 DIAGNOSIS — Y9389 Activity, other specified: Secondary | ICD-10-CM | POA: Insufficient documentation

## 2015-03-17 DIAGNOSIS — Z87891 Personal history of nicotine dependence: Secondary | ICD-10-CM | POA: Insufficient documentation

## 2015-03-17 DIAGNOSIS — K219 Gastro-esophageal reflux disease without esophagitis: Secondary | ICD-10-CM | POA: Insufficient documentation

## 2015-03-17 DIAGNOSIS — Y998 Other external cause status: Secondary | ICD-10-CM | POA: Insufficient documentation

## 2015-03-17 DIAGNOSIS — S40812A Abrasion of left upper arm, initial encounter: Secondary | ICD-10-CM | POA: Insufficient documentation

## 2015-03-17 DIAGNOSIS — M25461 Effusion, right knee: Secondary | ICD-10-CM | POA: Insufficient documentation

## 2015-03-17 DIAGNOSIS — Z79899 Other long term (current) drug therapy: Secondary | ICD-10-CM | POA: Insufficient documentation

## 2015-03-17 DIAGNOSIS — S86911A Strain of unspecified muscle(s) and tendon(s) at lower leg level, right leg, initial encounter: Secondary | ICD-10-CM

## 2015-03-17 DIAGNOSIS — S3992XA Unspecified injury of lower back, initial encounter: Secondary | ICD-10-CM | POA: Insufficient documentation

## 2015-03-17 DIAGNOSIS — I10 Essential (primary) hypertension: Secondary | ICD-10-CM | POA: Insufficient documentation

## 2015-03-17 DIAGNOSIS — S20212A Contusion of left front wall of thorax, initial encounter: Secondary | ICD-10-CM | POA: Insufficient documentation

## 2015-03-17 DIAGNOSIS — F329 Major depressive disorder, single episode, unspecified: Secondary | ICD-10-CM | POA: Insufficient documentation

## 2015-03-17 DIAGNOSIS — F419 Anxiety disorder, unspecified: Secondary | ICD-10-CM | POA: Insufficient documentation

## 2015-03-17 DIAGNOSIS — S86819A Strain of other muscle(s) and tendon(s) at lower leg level, unspecified leg, initial encounter: Secondary | ICD-10-CM | POA: Insufficient documentation

## 2015-03-17 DIAGNOSIS — G709 Myoneural disorder, unspecified: Secondary | ICD-10-CM | POA: Insufficient documentation

## 2015-03-17 DIAGNOSIS — E669 Obesity, unspecified: Secondary | ICD-10-CM | POA: Insufficient documentation

## 2015-03-17 DIAGNOSIS — S50812A Abrasion of left forearm, initial encounter: Secondary | ICD-10-CM | POA: Insufficient documentation

## 2015-03-17 DIAGNOSIS — Z880 Allergy status to penicillin: Secondary | ICD-10-CM | POA: Insufficient documentation

## 2015-03-17 DIAGNOSIS — Y92410 Unspecified street and highway as the place of occurrence of the external cause: Secondary | ICD-10-CM | POA: Insufficient documentation

## 2015-03-17 LAB — CBC
HEMATOCRIT: 40.3 % (ref 39.0–52.0)
Hemoglobin: 13.5 g/dL (ref 13.0–17.0)
MCH: 28.2 pg (ref 26.0–34.0)
MCHC: 33.5 g/dL (ref 30.0–36.0)
MCV: 84.1 fL (ref 78.0–100.0)
PLATELETS: 137 10*3/uL — AB (ref 150–400)
RBC: 4.79 MIL/uL (ref 4.22–5.81)
RDW: 12.6 % (ref 11.5–15.5)
WBC: 6.4 10*3/uL (ref 4.0–10.5)

## 2015-03-17 LAB — BASIC METABOLIC PANEL
Anion gap: 9 (ref 5–15)
BUN: 7 mg/dL (ref 6–20)
CHLORIDE: 98 mmol/L — AB (ref 101–111)
CO2: 29 mmol/L (ref 22–32)
CREATININE: 0.65 mg/dL (ref 0.61–1.24)
Calcium: 8.9 mg/dL (ref 8.9–10.3)
GFR calc Af Amer: >60 mL/min (ref >60)
GFR calc non Af Amer: >60 mL/min (ref >60)
GLUCOSE: 170 mg/dL — AB (ref 65–99)
POTASSIUM: 3.3 mmol/L — AB (ref 3.5–5.1)
SODIUM: 136 mmol/L (ref 135–145)

## 2015-03-17 LAB — I-STAT TROPONIN, ED: TROPONIN I, POC: 0.01 ng/mL (ref 0.00–0.08)

## 2015-03-17 MED ORDER — OXYCODONE-ACETAMINOPHEN 5-325 MG PO TABS
2.0000 | ORAL_TABLET | Freq: Once | ORAL | Status: AC
  Administered 2015-03-18: 2 via ORAL
  Filled 2015-03-17: qty 2
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
## 2015-03-18 MED ORDER — IBUPROFEN 800 MG PO TABS: 800.0000 mg | ORAL_TABLET | Freq: Three times a day (TID) | ORAL | Status: DC

## 2015-03-18 MED ORDER — CLONIDINE HCL 0.1 MG PO TABS
0.3000 mg | ORAL_TABLET | ORAL | Status: AC
  Administered 2015-03-18: 0.3 mg via ORAL
  Filled 2015-03-18 (×2): qty 3

## 2015-03-18 MED ORDER — HYDROCODONE-ACETAMINOPHEN 5-325 MG PO TABS: 2.0000 | ORAL_TABLET | ORAL | Status: DC | PRN

## 2015-03-18 NOTE — Discharge Instructions (Signed)
Chest Contusion A contusion is a deep bruise. Bruises happen when an injury causes bleeding under the skin. Signs of bruising include pain, puffiness (swelling), and discolored skin. The bruise may turn blue, purple, or yellow.  HOME CARE  Put ice on the injured area.  Put ice in a plastic bag.  Place a towel between the skin and the bag.  Leave the ice on for 15-20 minutes at a time, 03-04 times a day for the first 48 hours.  Only take medicine as told by your doctor.  Rest.  Take deep breaths (deep-breathing exercises) as told by your doctor.  Stop smoking if you smoke.  Do not lift objects over 5 pounds (2.3 kilograms) for 3 days or longer if told by your doctor. GET HELP RIGHT AWAY IF:   You have more bruising or puffiness.  You have pain that gets worse.  You have trouble breathing.  You are dizzy, weak, or pass out (faint).  You have blood in your pee (urine) or poop (stool).  You cough up or throw up (vomit) blood.  Your puffiness or pain is not helped with medicines. MAKE SURE YOU:   Understand these instructions.  Will watch your condition.  Will get help right away if you are not doing well or get worse.   This information is not intended to replace advice given to you by your health care provider. Make sure you discuss any questions you have with your health care provider.   Document Released: 08/07/2007 Document Revised: 11/13/2011 Document Reviewed: 08/12/2011 Elsevier Interactive Patient Education 2016 Elsevier Inc.  Muscle Strain A muscle strain (pulled muscle) happens when a muscle is stretched beyond normal length. It happens when a sudden, violent force stretches your muscle too far. Usually, a few of the fibers in your muscle are torn. Muscle strain is common in athletes. Recovery usually takes 1-2 weeks. Complete healing takes 5-6 weeks.  HOME CARE   Follow the PRICE method of treatment to help your injury get better. Do this the first 2-3  days after the injury:  Protect. Protect the muscle to keep it from getting injured again.  Rest. Limit your activity and rest the injured body part.  Ice. Put ice in a plastic bag. Place a towel between your skin and the bag. Then, apply the ice and leave it on from 15-20 minutes each hour. After the third day, switch to moist heat packs.  Compression. Use a splint or elastic bandage on the injured area for comfort. Do not put it on too tightly.  Elevate. Keep the injured body part above the level of your heart.  Only take medicine as told by your doctor.  Warm up before doing exercise to prevent future muscle strains. GET HELP IF:   You have more pain or puffiness (swelling) in the injured area.  You feel numbness, tingling, or notice a loss of strength in the injured area. MAKE SURE YOU:   Understand these instructions.  Will watch your condition.  Will get help right away if you are not doing well or get worse.   This information is not intended to replace advice given to you by your health care provider. Make sure you discuss any questions you have with your health care provider.   Document Released: 11/28/2007 Document Revised: 12/09/2012 Document Reviewed: 09/17/2012 Elsevier Interactive Patient Education 2016 Elsevier Inc[employer] Knee Sprain A knee sprain is a tear in one of the strong, fibrous tissues that connect the bones (ligaments) in  your knee. The severity of the sprain depends on how much of the ligament is torn. The tear can be either partial or complete. CAUSES  Often, sprains are a result of a fall or injury. The force of the impact causes the fibers of your ligament to stretch too much. This excess tension causes the fibers of your ligament to tear. SIGNS AND SYMPTOMS  You may have some loss of motion in your knee. Other symptoms include:  Bruising.  Pain in the knee area.  Tenderness of the knee to the touch.  Swelling. DIAGNOSIS  To diagnose a knee  sprain, your health care provider will physically examine your knee. Your health care provider may also suggest an X-ray exam of your knee to make sure no bones are broken. TREATMENT  If your ligament is only partially torn, treatment usually involves keeping the knee in a fixed position (immobilization) or bracing your knee for activities that require movement for several weeks. To do this, your health care provider will apply a bandage, cast, or splint to keep your knee from moving and to support your knee during movement until it heals. For a partially torn ligament, the healing process usually takes 4-6 weeks. If your ligament is completely torn, depending on which ligament it is, you may need surgery to reconnect the ligament to the bone or reconstruct it. After surgery, a cast or splint may be applied and will need to stay on your knee for 4-6 weeks while your ligament heals. HOME CARE INSTRUCTIONS  Keep your injured knee elevated to decrease swelling.  To ease pain and swelling, apply ice to the injured area:  Put ice in a plastic bag.  Place a towel between your skin and the bag.  Leave the ice on for 20 minutes, 2-3 times a day.  Only take medicine for pain as directed by your health care provider.  Do not leave your knee unprotected until pain and stiffness go away (usually 4-6 weeks).  If you have a cast or splint, do not allow it to get wet. If you have been instructed not to remove it, cover it with a plastic bag when you shower or bathe. Do not swim.  Your health care provider may suggest exercises for you to do during your recovery to prevent or limit permanent weakness and stiffness. SEEK IMMEDIATE MEDICAL CARE IF:  Your cast or splint becomes damaged.  Your pain becomes worse.  You have significant pain, swelling, or numbness below the cast or splint. MAKE SURE YOU:  Understand these instructions.  Will watch your condition.  Will get help right away if you are  not doing well or get worse.   This information is not intended to replace advice given to you by your health care provider. Make sure you discuss any questions you have with your health care provider.   Document Released: 02/18/2005 Document Revised: 03/11/2014 Document Reviewed: 09/30/2012 Elsevier Interactive Patient Education 2016 [employer]. How to Use a Knee Brace A knee brace is a device that you wear to support your knee, especially if the knee is healing after an injury or surgery. There are several types of knee braces. Some are designed to prevent an injury (prophylactic brace). These are often worn during sports. Others support an injured knee (functional brace) or keep it still while it heals (rehabilitative brace). People with severe arthritis of the knee may benefit from a brace that takes some pressure off the knee (unloader brace). Most knee braces  are made from a combination of cloth and metal or plastic.  You may need to wear a knee brace to:  Relieve knee pain.  Help your knee support your weight (improve stability).  Help you walk farther (improve mobility).  Prevent injury.  Support your knee while it heals from surgery or from an injury. RISKS AND COMPLICATIONS Generally, knee braces are very safe to wear. However, problems may occur, including:  Skin irritation that may lead to infection.  Making your condition worse if you wear the brace in the wrong way. HOW TO USE A KNEE BRACE Different braces will have different instructions for use. Your health care provider will tell you or show you:  How to put on your brace.  How to adjust the brace.  When and how often to wear the brace.  How to remove the brace.  If you will need any assistive devices in addition to the brace, such as crutches or a cane. In general, your brace should:  Have the hinge of the brace line up with the bend of your knee.  Have straps, hooks, or tapes that fasten snugly around  your leg.  Not feel too tight or too loose. HOW TO CARE FOR A KNEE BRACE  Check your brace often for signs of damage, such as loose connections or attachments. Your knee brace may get damaged or wear out during normal use.  Wash the fabric parts of your brace with soap and water.  Read the insert that comes with your brace for other specific care instructions. SEEK MEDICAL CARE IF:  Your knee brace is too loose or too tight and you cannot adjust it.  Your knee brace causes skin redness, swelling, bruising, or irritation.  Your knee brace is not helping.  Your knee brace is making your knee pain worse.   This information is not intended to replace advice given to you by your health care provider. Make sure you discuss any questions you have with your health care provider.   Document Released: 05/11/2003 Document Revised: 11/09/2014 Document Reviewed: 06/13/2014 Elsevier Interactive Patient Education [employer]2016 Elsevier Inc.

## 2015-04-05 ENCOUNTER — Other Ambulatory Visit: Payer: Self-pay | Admitting: Family Medicine

## 2015-04-06 ENCOUNTER — Encounter (HOSPITAL_COMMUNITY): Payer: Self-pay | Admitting: Emergency Medicine

## 2015-04-06 ENCOUNTER — Other Ambulatory Visit: Payer: Self-pay

## 2015-04-06 ENCOUNTER — Emergency Department (HOSPITAL_COMMUNITY): Payer: No Typology Code available for payment source

## 2015-04-06 ENCOUNTER — Emergency Department (HOSPITAL_COMMUNITY)
Admission: EM | Admit: 2015-04-06 | Discharge: 2015-04-06 | Disposition: A | Discharge status: Home / Self Care | Payer: No Typology Code available for payment source | Attending: Emergency Medicine | Admitting: Emergency Medicine

## 2015-04-06 DIAGNOSIS — E669 Obesity, unspecified: Secondary | ICD-10-CM | POA: Insufficient documentation

## 2015-04-06 DIAGNOSIS — F4310 Post-traumatic stress disorder, unspecified: Secondary | ICD-10-CM | POA: Insufficient documentation

## 2015-04-06 DIAGNOSIS — F1310 Sedative, hypnotic or anxiolytic abuse, uncomplicated: Secondary | ICD-10-CM | POA: Insufficient documentation

## 2015-04-06 DIAGNOSIS — I10 Essential (primary) hypertension: Secondary | ICD-10-CM | POA: Insufficient documentation

## 2015-04-06 DIAGNOSIS — Z791 Long term (current) use of non-steroidal anti-inflammatories (NSAID): Secondary | ICD-10-CM | POA: Insufficient documentation

## 2015-04-06 DIAGNOSIS — K219 Gastro-esophageal reflux disease without esophagitis: Secondary | ICD-10-CM | POA: Insufficient documentation

## 2015-04-06 DIAGNOSIS — F410 Panic disorder [episodic paroxysmal anxiety] without agoraphobia: Secondary | ICD-10-CM | POA: Insufficient documentation

## 2015-04-06 DIAGNOSIS — Z8739 Personal history of other diseases of the musculoskeletal system and connective tissue: Secondary | ICD-10-CM | POA: Insufficient documentation

## 2015-04-06 DIAGNOSIS — Z87891 Personal history of nicotine dependence: Secondary | ICD-10-CM | POA: Insufficient documentation

## 2015-04-06 DIAGNOSIS — Z880 Allergy status to penicillin: Secondary | ICD-10-CM | POA: Insufficient documentation

## 2015-04-06 DIAGNOSIS — Z79899 Other long term (current) drug therapy: Secondary | ICD-10-CM | POA: Insufficient documentation

## 2015-04-06 LAB — CBC WITH DIFFERENTIAL/PLATELET
BASOS PCT: 1 %
Basophils Absolute: 0.1 10*3/uL (ref 0.0–0.1)
Eosinophils Absolute: 0.2 10*3/uL (ref 0.0–0.7)
Eosinophils Relative: 3 %
HEMATOCRIT: 48.3 % (ref 39.0–52.0)
HEMOGLOBIN: 16 g/dL (ref 13.0–17.0)
LYMPHS ABS: 3.4 10*3/uL (ref 0.7–4.0)
Lymphocytes Relative: 42 %
MCH: 28.8 pg (ref 26.0–34.0)
MCHC: 33.1 g/dL (ref 30.0–36.0)
MCV: 86.9 fL (ref 78.0–100.0)
MONO ABS: 0.4 10*3/uL (ref 0.1–1.0)
MONOS PCT: 5 %
NEUTROS ABS: 4 10*3/uL (ref 1.7–7.7)
NEUTROS PCT: 49 %
Platelets: 189 10*3/uL (ref 150–400)
RBC: 5.56 MIL/uL (ref 4.22–5.81)
RDW: 13.2 % (ref 11.5–15.5)
WBC: 8.1 10*3/uL (ref 4.0–10.5)

## 2015-04-06 LAB — I-STAT CHEM 8, ED
BUN: 5 mg/dL — AB (ref 6–20)
CALCIUM ION: 1.09 mmol/L — AB (ref 1.12–1.23)
CREATININE: 0.8 mg/dL (ref 0.61–1.24)
Chloride: 95 mmol/L — ABNORMAL LOW (ref 101–111)
GLUCOSE: 151 mg/dL — AB (ref 65–99)
HEMATOCRIT: 52 % (ref 39.0–52.0)
HEMOGLOBIN: 17.7 g/dL — AB (ref 13.0–17.0)
Potassium: 3.1 mmol/L — ABNORMAL LOW (ref 3.5–5.1)
Sodium: 139 mmol/L (ref 135–145)
TCO2: 31 mmol/L (ref 0–100)

## 2015-04-06 LAB — RAPID URINE DRUG SCREEN, HOSP PERFORMED
AMPHETAMINES: NOT DETECTED
BARBITURATES: NOT DETECTED
Benzodiazepines: POSITIVE — AB
Cocaine: NOT DETECTED
OPIATES: NOT DETECTED
TETRAHYDROCANNABINOL: NOT DETECTED

## 2015-04-06 LAB — I-STAT TROPONIN, ED: Troponin i, poc: 0 ng/mL (ref 0.00–0.08)

## 2015-04-06 LAB — D-DIMER, QUANTITATIVE (NOT AT ARMC): D DIMER QUANT: 0.45 ug{FEU}/mL (ref 0.00–0.50)

## 2015-04-06 MED ORDER — METOPROLOL TARTRATE 1 MG/ML IV SOLN
2.5000 mg | Freq: Once | INTRAVENOUS | Status: AC
  Administered 2015-04-06: 2.5 mg via INTRAVENOUS
  Filled 2015-04-06: qty 5

## 2015-04-06 MED ORDER — CLONIDINE HCL 0.3 MG PO TABS: 0.3000 mg | ORAL_TABLET | Freq: Two times a day (BID) | ORAL | Status: DC

## 2015-04-06 MED ORDER — ACETAMINOPHEN 500 MG PO TABS
1000.0000 mg | ORAL_TABLET | Freq: Once | ORAL | Status: AC
  Administered 2015-04-06: 1000 mg via ORAL
  Filled 2015-04-06: qty 2

## 2015-04-06 MED ORDER — TRAMADOL HCL 50 MG PO TABS
50.0000 mg | ORAL_TABLET | Freq: Once | ORAL | Status: AC
  Administered 2015-04-06: 50 mg via ORAL
  Filled 2015-04-06: qty 1

## 2015-04-06 NOTE — ED Provider Notes (Signed)
CSN: [id number]     Arrival date & time 04/06/15  0353 History   First MD Initiated Contact with Patient 04/06/15 0422     Chief Complaint  Patient presents with  . Hypertension     (Consider location/radiation/quality/duration/timing/severity/associated sxs/prior Treatment) Patient is a 50 y.o. male presenting with hypertension. The history is provided by the patient.  Hypertension This is a chronic problem. The current episode started more than 1 week ago. The problem occurs constantly. The problem has been gradually worsening (lost his meds). Pertinent negatives include no chest pain, no abdominal pain, no headaches and no shortness of breath. Nothing aggravates the symptoms. Nothing relieves the symptoms. He has tried nothing for the symptoms. The treatment provided no relief.    Past Medical History  Diagnosis Date  . Hypertension   . Tachycardia - pulse   . Degenerative disk disease   . Spinal stenosis   . Degenerative disk disease   . Tobacco abuse   . Obesity   . Polysubstance abuse   . Chest pain     Hospital, March, 2014, negative enzymes, patient refused in-hospital  stress test, patient canceled outpatient stress test  . Spinal stenosis   . Depression   . Neuromuscular disorder (HCC)   . Anxiety     Panic attacks  . Shortness of breath dyspnea     with exertion  . PTSD (post-traumatic stress disorder)   . Incontinence of urine   . GERD (gastroesophageal reflux disease)   . Constipation    Past Surgical History  Procedure Laterality Date  . Wisdom tooth extraction    . Esophagogastroduodenoscopy (egd) with propofol N/A 08/24/2014    Procedure: ESOPHAGOGASTRODUODENOSCOPY (EGD) WITH PROPOFOL;  Surgeon: Charlott Rakes, MD;  Location: [facility] ENDOSCOPY;  Service: Endoscopy;  Laterality: N/A;  . Colonoscopy N/A 08/24/2014    Procedure: COLONOSCOPY;  Surgeon: Charlott Rakes, MD;  Location: [facility] ENDOSCOPY;  Service: Endoscopy;  Laterality: N/A;   Family History  Problem  Relation Age of Onset  . Stroke Mother   . Hypertension Mother   . Hyperlipidemia Mother   . Stroke Father   . Hypertension Father   . Dementia Father   . Diabetes Father   . Heart disease Father   . Hyperlipidemia Father   . Cancer Sister     brain  . Diabetes Sister   . Heart disease Sister   . Hyperlipidemia Sister   . Hypertension Sister   . Stroke Sister   . Heart disease Brother   . Hyperlipidemia Brother    Social History  Substance Use Topics  . Smoking status: Former Smoker -- 0.02 packs/day for 27 years    Types: Cigarettes  . Smokeless tobacco: Never Used     Comment: "quit Alexsis Kathman 2016, I havwe one every now and then"  . Alcohol Use: No    Review of Systems  Respiratory: Negative for shortness of breath.   Cardiovascular: Negative for chest pain.  Gastrointestinal: Negative for abdominal pain.  Neurological: Negative for headaches.  All other systems reviewed and are negative.     Allergies  Blueberry flavor; Penicillins; Robaxin; Flexeril; and Toradol  Home Medications   Prior to Admission medications   Medication Sig Start Date End Date Taking? Authorizing Provider  cloNIDine (CATAPRES) 0.3 MG tablet TAKE ONE TABLET BY MOUTH TWICE DAILY 03/07/15  Yes Sherren Mocha, MD  diazepam (VALIUM) 5 MG tablet Take 1 tablet (5 mg total) by mouth every 8 (eight) hours as needed for muscle  spasms. 11/21/14  Yes Shon Baton, MD  gabapentin (NEURONTIN) 300 MG capsule Take 300 mg by mouth 3 (three) times daily.   Yes Historical Provider, MD  HYDROcodone-acetaminophen (NORCO/VICODIN) 5-325 MG tablet Take 2 tablets by mouth every 4 (four) hours as needed. 03/18/15  Yes Danelle Berry, PA-C  ibuprofen (ADVIL,MOTRIN) 800 MG tablet Take 1 tablet (800 mg total) by mouth 3 (three) times daily. 03/18/15  Yes Danelle Berry, PA-C  lisinopril (PRINIVIL,ZESTRIL) 20 MG tablet Take 1 tablet (20 mg total) by mouth every morning. For high blood pressure 02/11/15  Yes Dione Booze, MD   ranitidine (ZANTAC) 150 MG tablet Take 1 tablet (150 mg total) by mouth daily as needed for heartburn. 02/11/15  Yes Dione Booze, MD  traMADol (ULTRAM) 50 MG tablet Take 2 tablets (100 mg total) by mouth every 6 (six) hours as needed. Do not use >8 tabs/day. Patient taking differently: Take 100 mg by mouth every 6 (six) hours as needed for moderate pain. Do not use >8 tabs/day. 02/11/15  Yes Dione Booze, MD  Venlafaxine HCl (EFFEXOR PO) Take 1 capsule by mouth daily.   Yes Historical Provider, MD   BP 152/124 mmHg  Pulse 98  Temp(Src) 97.6 F (36.4 C) (Oral)  Resp 12  SpO2 96% Physical Exam  Constitutional: He is oriented to person, place, and time. He appears well-developed and well-nourished. No distress.  HENT:  Head: Normocephalic and atraumatic.  Mouth/Throat: Oropharynx is clear and moist.  Eyes: Conjunctivae and EOM are normal. Pupils are equal, round, and reactive to light.  Neck: Normal range of motion. Neck supple.  Cardiovascular: Normal rate, regular rhythm and intact distal pulses.   Pulmonary/Chest: Effort normal and breath sounds normal. No respiratory distress. He has no wheezes. He has no rales.  Abdominal: Soft. Bowel sounds are normal. There is no tenderness. There is no rebound and no guarding.  Musculoskeletal: Normal range of motion.  Neurological: He is alert and oriented to person, place, and time. He has normal reflexes.  Skin: Skin is warm and dry.  Psychiatric: He has a normal mood and affect.    ED Course  Procedures (including critical care time) Labs Review Labs Reviewed  URINE RAPID DRUG SCREEN, HOSP PERFORMED - Abnormal; Notable for the following:    Benzodiazepines POSITIVE (*)    All other components within normal limits  I-STAT CHEM 8, ED - Abnormal; Notable for the following:    Potassium 3.1 (*)    Chloride 95 (*)    BUN 5 (*)    Glucose, Bld 151 (*)    Calcium, Ion 1.09 (*)    Hemoglobin 17.7 (*)    All other components within normal  limits  CBC WITH DIFFERENTIAL/PLATELET  D-DIMER, QUANTITATIVE (NOT AT [facility])  Rosezena Sensor, ED    Imaging Review Dg Chest 2 View  04/06/2015  CLINICAL DATA:  Tachycardia, hypertension, lost blood pressure medication. EXAM: CHEST  2 VIEW COMPARISON:  Chest radiograph March 17, 2015 FINDINGS: Cardiomediastinal silhouette is normal. The lungs are clear without pleural effusions or focal consolidations. LEFT lung base scarring. Trachea projects midline and there is no pneumothorax. Soft tissue planes and included osseous structures are non-suspicious. Large body habitus. IMPRESSION: No active cardiopulmonary disease. Electronically Signed   By: Awilda Metro M.D.   On: 04/06/2015 05:03   I have personally reviewed and evaluated these images and lab results as part of my medical decision-making.   EKG Interpretation None      MDM  Final diagnoses:  None    ED ECG REPORT   Date: 04/06/2015  Rate:123  Rhythm: sinus tachycardia  QRS Axis: normal  Intervals: normal  ST/T Wave abnormalities: normal  Conduction Disutrbances:none  Narrative Interpretation:   Old EKG Reviewed: none available  I have personally reviewed the EKG tracing and agree with the computerized printout as noted.  BP and HR treate4d we will not refill narcotics or benzos.     Cy Blamer, MD 04/06/15 [phone]

## 2015-04-06 NOTE — ED Notes (Signed)
Patient d/c'd self care.  F/U and medications discussed.  Patient verbalized understanding. 

## 2015-04-06 NOTE — ED Notes (Signed)
Patient transported to X-ray 

## 2015-04-06 NOTE — Discharge Instructions (Signed)
DASH Eating Plan  DASH stands for "Dietary Approaches to Stop Hypertension." The DASH eating plan is a healthy eating plan that has been shown to reduce high blood pressure (hypertension). Additional health benefits may include reducing the risk of type 2 diabetes mellitus, heart disease, and stroke. The DASH eating plan may also help with weight loss.  WHAT DO I NEED TO KNOW ABOUT THE DASH EATING PLAN?  For the DASH eating plan, you will follow these general guidelines:  · Choose foods with a percent daily value for sodium of less than 5% (as listed on the food label).  · Use salt-free seasonings or herbs instead of table salt or sea salt.  · Check with your health care provider or pharmacist before using salt substitutes.  · Eat lower-sodium products, often labeled as "lower sodium" or "no salt added."  · Eat fresh foods.  · Eat more vegetables, fruits, and low-fat dairy products.  · Choose whole grains. Look for the word "whole" as the first word in the ingredient list.  · Choose fish and skinless chicken or turkey more often than red meat. Limit fish, poultry, and meat to 6 oz (170 g) each day.  · Limit sweets, desserts, sugars, and sugary drinks.  · Choose heart-healthy fats.  · Limit cheese to 1 oz (28 g) per day.  · Eat more home-cooked food and less restaurant, buffet, and fast food.  · Limit fried foods.  · Cook foods using methods other than frying.  · Limit canned vegetables. If you do use them, rinse them well to decrease the sodium.  · When eating at a restaurant, ask that your food be prepared with less salt, or no salt if possible.  WHAT FOODS CAN I EAT?  Seek help from a dietitian for individual calorie needs.  Grains  Whole grain or whole wheat bread. Brown rice. Whole grain or whole wheat pasta. Quinoa, bulgur, and whole grain cereals. Low-sodium cereals. Corn or whole wheat flour tortillas. Whole grain cornbread. Whole grain crackers. Low-sodium crackers.  Vegetables  Fresh or frozen vegetables  (raw, steamed, roasted, or grilled). Low-sodium or reduced-sodium tomato and vegetable juices. Low-sodium or reduced-sodium tomato sauce and paste. Low-sodium or reduced-sodium canned vegetables.   Fruits  All fresh, canned (in natural juice), or frozen fruits.  Meat and Other Protein Products  Ground beef (85% or leaner), grass-fed beef, or beef trimmed of fat. Skinless chicken or turkey. Ground chicken or turkey. Pork trimmed of fat. All fish and seafood. Eggs. Dried beans, peas, or lentils. Unsalted nuts and seeds. Unsalted canned beans.  Dairy  Low-fat dairy products, such as skim or 1% milk, 2% or reduced-fat cheeses, low-fat ricotta or cottage cheese, or plain low-fat yogurt. Low-sodium or reduced-sodium cheeses.  Fats and Oils  Tub margarines without trans fats. Light or reduced-fat mayonnaise and salad dressings (reduced sodium). Avocado. Safflower, olive, or canola oils. Natural peanut or almond butter.  Other  Unsalted popcorn and pretzels.  The items listed above may not be a complete list of recommended foods or beverages. Contact your dietitian for more options.  WHAT FOODS ARE NOT RECOMMENDED?  Grains  White bread. White pasta. White rice. Refined cornbread. Bagels and croissants. Crackers that contain trans fat.  Vegetables  Creamed or fried vegetables. Vegetables in a cheese sauce. Regular canned vegetables. Regular canned tomato sauce and paste. Regular tomato and vegetable juices.  Fruits  Dried fruits. Canned fruit in light or heavy syrup. Fruit juice.  Meat and Other Protein   Products  Fatty cuts of meat. Ribs, chicken wings, bacon, sausage, bologna, salami, chitterlings, fatback, hot dogs, bratwurst, and packaged luncheon meats. Salted nuts and seeds. Canned beans with salt.  Dairy  Whole or 2% milk, cream, half-and-half, and cream cheese. Whole-fat or sweetened yogurt. Full-fat cheeses or blue cheese. Nondairy creamers and whipped toppings. Processed cheese, cheese spreads, or cheese  curds.  Condiments  Onion and garlic salt, seasoned salt, table salt, and sea salt. Canned and packaged gravies. Worcestershire sauce. Tartar sauce. Barbecue sauce. Teriyaki sauce. Soy sauce, including reduced sodium. Steak sauce. Fish sauce. Oyster sauce. Cocktail sauce. Horseradish. Ketchup and mustard. Meat flavorings and tenderizers. Bouillon cubes. Hot sauce. Tabasco sauce. Marinades. Taco seasonings. Relishes.  Fats and Oils  Butter, stick margarine, lard, shortening, ghee, and bacon fat. Coconut, palm kernel, or palm oils. Regular salad dressings.  Other  Pickles and olives. Salted popcorn and pretzels.  The items listed above may not be a complete list of foods and beverages to avoid. Contact your dietitian for more information.  WHERE CAN I FIND MORE INFORMATION?  National Heart, Lung, and [employer] Institute: www.nhlbi.nih.gov/health/health-topics/topics/dash/     This information is not intended to replace advice given to you by your health care provider. Make sure you discuss any questions you have with your health care provider.     Document Released: 02/07/2011 Document Revised: 03/11/2014 Document Reviewed: 12/23/2012  Elsevier Interactive Patient Education ©2016 Elsevier Inc.

## 2015-04-06 NOTE — ED Notes (Signed)
Pt from home with complaints of tachycardia and hypertension. Pt denies headache or runny nose, but pt states he has "pressure in his head". Pt's bp in assessment is 183/133.

## 2015-04-15 ENCOUNTER — Emergency Department (HOSPITAL_COMMUNITY)
Admission: EM | Admit: 2015-04-15 | Discharge: 2015-04-15 | Disposition: A | Discharge status: Home / Self Care | Payer: No Typology Code available for payment source | Attending: Emergency Medicine | Admitting: Emergency Medicine

## 2015-04-15 DIAGNOSIS — Z9119 Patient's noncompliance with other medical treatment and regimen: Secondary | ICD-10-CM | POA: Insufficient documentation

## 2015-04-15 DIAGNOSIS — F329 Major depressive disorder, single episode, unspecified: Secondary | ICD-10-CM | POA: Insufficient documentation

## 2015-04-15 DIAGNOSIS — F4310 Post-traumatic stress disorder, unspecified: Secondary | ICD-10-CM | POA: Insufficient documentation

## 2015-04-15 DIAGNOSIS — Z79899 Other long term (current) drug therapy: Secondary | ICD-10-CM | POA: Insufficient documentation

## 2015-04-15 DIAGNOSIS — Z8739 Personal history of other diseases of the musculoskeletal system and connective tissue: Secondary | ICD-10-CM | POA: Insufficient documentation

## 2015-04-15 DIAGNOSIS — E669 Obesity, unspecified: Secondary | ICD-10-CM | POA: Insufficient documentation

## 2015-04-15 DIAGNOSIS — K219 Gastro-esophageal reflux disease without esophagitis: Secondary | ICD-10-CM | POA: Insufficient documentation

## 2015-04-15 DIAGNOSIS — Z880 Allergy status to penicillin: Secondary | ICD-10-CM | POA: Insufficient documentation

## 2015-04-15 DIAGNOSIS — F410 Panic disorder [episodic paroxysmal anxiety] without agoraphobia: Secondary | ICD-10-CM | POA: Insufficient documentation

## 2015-04-15 DIAGNOSIS — Z87891 Personal history of nicotine dependence: Secondary | ICD-10-CM | POA: Insufficient documentation

## 2015-04-15 DIAGNOSIS — R000 Tachycardia, unspecified: Secondary | ICD-10-CM | POA: Insufficient documentation

## 2015-04-15 DIAGNOSIS — Z9114 Patient's other noncompliance with medication regimen: Secondary | ICD-10-CM

## 2015-04-15 DIAGNOSIS — Z8669 Personal history of other diseases of the nervous system and sense organs: Secondary | ICD-10-CM | POA: Insufficient documentation

## 2015-04-15 DIAGNOSIS — Z791 Long term (current) use of non-steroidal anti-inflammatories (NSAID): Secondary | ICD-10-CM | POA: Insufficient documentation

## 2015-04-15 MED ORDER — METOPROLOL TARTRATE 50 MG PO TABS: 50.0000 mg | ORAL_TABLET | Freq: Two times a day (BID) | ORAL | Status: DC

## 2015-04-15 MED ORDER — CLONIDINE HCL 0.1 MG PO TABS
0.3000 mg | ORAL_TABLET | Freq: Two times a day (BID) | ORAL | Status: DC
  Administered 2015-04-15: 0.3 mg via ORAL
  Filled 2015-04-15: qty 3

## 2015-04-15 MED ORDER — METOPROLOL TARTRATE 25 MG PO TABS
50.0000 mg | ORAL_TABLET | Freq: Once | ORAL | Status: AC
  Administered 2015-04-15: 50 mg via ORAL
  Filled 2015-04-15: qty 2

## 2015-04-15 MED ORDER — CLONIDINE HCL 0.3 MG PO TABS: 0.3000 mg | ORAL_TABLET | Freq: Two times a day (BID) | ORAL | Status: DC

## 2015-04-15 NOTE — ED Provider Notes (Signed)
CSN: [id number]     Arrival date & time 04/15/15  1191 History   First MD Initiated Contact with Patient 04/15/15 [phone]     Chief Complaint  Patient presents with  . Hypertension      HPI  Patient states that he is homeless and he lost his metoprolol and clonidine blood pressure medicine.  He states all he needs is a refill for that medicine.  Denies any chest pain or headache.  Denies abdominal pain or other physical complaints.  He states if we can give him a prescription his mother will help him pay for it until he can see his doctor next Tuesday which she has a schedule appointment for. Past Medical History  Diagnosis Date  . Hypertension   . Tachycardia - pulse   . Degenerative disk disease   . Spinal stenosis   . Degenerative disk disease   . Tobacco abuse   . Obesity   . Polysubstance abuse   . Chest pain     Hospital, March, 2014, negative enzymes, patient refused in-hospital  stress test, patient canceled outpatient stress test  . Spinal stenosis   . Depression   . Neuromuscular disorder (HCC)   . Anxiety     Panic attacks  . Shortness of breath dyspnea     with exertion  . PTSD (post-traumatic stress disorder)   . Incontinence of urine   . GERD (gastroesophageal reflux disease)   . Constipation    Past Surgical History  Procedure Laterality Date  . Wisdom tooth extraction    . Esophagogastroduodenoscopy (egd) with propofol N/A 08/24/2014    Procedure: ESOPHAGOGASTRODUODENOSCOPY (EGD) WITH PROPOFOL;  Surgeon: Charlott Rakes, MD;  Location: [facility] ENDOSCOPY;  Service: Endoscopy;  Laterality: N/A;  . Colonoscopy N/A 08/24/2014    Procedure: COLONOSCOPY;  Surgeon: Charlott Rakes, MD;  Location: [facility] ENDOSCOPY;  Service: Endoscopy;  Laterality: N/A;   Family History  Problem Relation Age of Onset  . Stroke Mother   . Hypertension Mother   . Hyperlipidemia Mother   . Stroke Father   . Hypertension Father   . Dementia Father   . Diabetes Father   . Heart disease  Father   . Hyperlipidemia Father   . Cancer Sister     brain  . Diabetes Sister   . Heart disease Sister   . Hyperlipidemia Sister   . Hypertension Sister   . Stroke Sister   . Heart disease Brother   . Hyperlipidemia Brother    Social History  Substance Use Topics  . Smoking status: Former Smoker -- 0.02 packs/day for 27 years    Types: Cigarettes  . Smokeless tobacco: Never Used     Comment: "quit April 2016, I havwe one every now and then"  . Alcohol Use: No    Review of Systems  Respiratory: Negative for chest tightness.   Neurological: Negative for syncope and headaches.  All other systems reviewed and are negative.     Allergies  Blueberry flavor; Penicillins; Robaxin; Flexeril; and Toradol  Home Medications   Prior to Admission medications   Medication Sig Start Date End Date Taking? Authorizing Provider  baclofen (LIORESAL) 10 MG tablet Take 10 mg by mouth 3 (three) times daily.   Yes Historical Provider, MD  diazepam (VALIUM) 5 MG tablet Take 1 tablet (5 mg total) by mouth every 8 (eight) hours as needed for muscle spasms. 11/21/14  Yes Shon Baton, MD  HYDROcodone-acetaminophen (NORCO/VICODIN) 5-325 MG tablet Take 2 tablets by mouth  every 4 (four) hours as needed. 03/18/15  Yes Danelle Berry, PA-C  ibuprofen (ADVIL,MOTRIN) 800 MG tablet Take 1 tablet (800 mg total) by mouth 3 (three) times daily. 03/18/15  Yes Danelle Berry, PA-C  lisinopril (PRINIVIL,ZESTRIL) 20 MG tablet Take 1 tablet (20 mg total) by mouth every morning. For high blood pressure 02/11/15  Yes Dione Booze, MD  ranitidine (ZANTAC) 150 MG tablet Take 1 tablet (150 mg total) by mouth daily as needed for heartburn. 02/11/15  Yes Dione Booze, MD  traMADol (ULTRAM) 50 MG tablet Take 2 tablets (100 mg total) by mouth every 6 (six) hours as needed. Do not use >8 tabs/day. Patient taking differently: Take 100 mg by mouth every 6 (six) hours as needed for moderate pain. Do not use >8 tabs/day. 02/11/15  Yes  Dione Booze, MD  venlafaxine (EFFEXOR) 25 MG tablet Take 25 mg by mouth 2 (two) times daily.   Yes Historical Provider, MD  cloNIDine (CATAPRES) 0.3 MG tablet Take 1 tablet (0.3 mg total) by mouth 2 (two) times daily. 04/15/15   Nelva Nay, MD  metoprolol (LOPRESSOR) 50 MG tablet Take 1 tablet (50 mg total) by mouth 2 (two) times daily. 04/15/15   Nelva Nay, MD   BP 155/116 mmHg  Pulse 122  Temp(Src) 97.9 F (36.6 C)  Resp 16  Ht  (1.803 m)  Wt 286 lb (129.729 kg)  BMI 39.91 kg/m2  SpO2 93% Physical Exam  Constitutional: He is oriented to person, place, and time. He appears well-developed and well-nourished. No distress.  HENT:  Head: Normocephalic and atraumatic.  Eyes: Pupils are equal, round, and reactive to light.  Neck: Normal range of motion.  Cardiovascular: Intact distal pulses.  Tachycardia present.   Pulmonary/Chest: No respiratory distress.  Abdominal: Normal appearance. He exhibits no distension.  Musculoskeletal: Normal range of motion.  Neurological: He is alert and oriented to person, place, and time. No cranial nerve deficit.  Skin: Skin is warm and dry. No rash noted.  Psychiatric: He has a normal mood and affect. His behavior is normal.  Nursing note and vitals reviewed.   ED Course  Procedures (including critical care time) Medications  cloNIDine (CATAPRES) tablet 0.3 mg (0.3 mg Oral Given 04/15/15 0745)  metoprolol tartrate (LOPRESSOR) tablet 50 mg (50 mg Oral Given 04/15/15 0745)    Labs Review Labs Reviewed - No data to display  Imaging Review No results found. I have personally reviewed and evaluated these images and lab results as part of my medical decision-making.   Date: 04/15/2015  Rate: 124  Rhythm: Sinus tachycardia  QRS Axis: normal  Intervals: normal  ST/T Wave abnormalities: normal  Conduction Disutrbances: Probable left atrial enlargement.  Probable left ventricular hypertrophy  Narrative Interpretation: Abnormal  EKG  After treatment in the ED the patient feels back to baseline and wants to go home.    MDM   Final diagnoses:  Noncompliance with medication treatment due to incorrect storage of medication        Nelva Nay, MD 04/15/15 [phone]

## 2015-04-15 NOTE — Discharge Instructions (Signed)
Hypertension Hypertension, commonly called high blood pressure, is when the force of blood pumping through your arteries is too strong. Your arteries are the blood vessels that carry blood from your heart throughout your body. A blood pressure reading consists of a higher number over a lower number, such as 110/72. The higher number (systolic) is the pressure inside your arteries when your heart pumps. The lower number (diastolic) is the pressure inside your arteries when your heart relaxes. Ideally you want your blood pressure below 120/80. Hypertension forces your heart to work harder to pump blood. Your arteries may become narrow or stiff. Having untreated or uncontrolled hypertension can cause heart attack, stroke, kidney disease, and other problems. RISK FACTORS Some risk factors for high blood pressure are controllable. Others are not.  Risk factors you cannot control include:   Race. You may be at higher risk if you are African American.  Age. Risk increases with age.  Gender. Men are at higher risk than women before age 45 years. After age 65, women are at higher risk than men. Risk factors you can control include:  Not getting enough exercise or physical activity.  Being overweight.  Getting too much fat, sugar, calories, or salt in your diet.  Drinking too much alcohol. SIGNS AND SYMPTOMS Hypertension does not usually cause signs or symptoms. Extremely high blood pressure (hypertensive crisis) may cause headache, anxiety, shortness of breath, and nosebleed. DIAGNOSIS To check if you have hypertension, your health care provider will measure your blood pressure while you are seated, with your arm held at the level of your heart. It should be measured at least twice using the same arm. Certain conditions can cause a difference in blood pressure between your right and left arms. A blood pressure reading that is higher than normal on one occasion does not mean that you need treatment. If  it is not clear whether you have high blood pressure, you may be asked to return on a different day to have your blood pressure checked again. Or, you may be asked to monitor your blood pressure at home for 1 or more weeks. TREATMENT Treating high blood pressure includes making lifestyle changes and possibly taking medicine. Living a healthy lifestyle can help lower high blood pressure. You may need to change some of your habits. Lifestyle changes may include:  Following the DASH diet. This diet is high in fruits, vegetables, and whole grains. It is low in salt, red meat, and added sugars.  Keep your sodium intake below 2,300 mg per day.  Getting at least 30-45 minutes of aerobic exercise at least 4 times per week.  Losing weight if necessary.  Not smoking.  Limiting alcoholic beverages.  Learning ways to reduce stress. Your health care provider may prescribe medicine if lifestyle changes are not enough to get your blood pressure under control, and if one of the following is true:  You are 18-59 years of age and your systolic blood pressure is above 140.  You are 60 years of age or older, and your systolic blood pressure is above 150.  Your diastolic blood pressure is above 90.  You have diabetes, and your systolic blood pressure is over 140 or your diastolic blood pressure is over 90.  You have kidney disease and your blood pressure is above 140/90.  You have heart disease and your blood pressure is above 140/90. Your personal target blood pressure may vary depending on your medical conditions, your age, and other factors. HOME CARE INSTRUCTIONS    Have your blood pressure rechecked as directed by your health care provider.   Take medicines only as directed by your health care provider. Follow the directions carefully. Blood pressure medicines must be taken as prescribed. The medicine does not work as well when you skip doses. Skipping doses also puts you at risk for  problems.  Do not smoke.   Monitor your blood pressure at home as directed by your health care provider. SEEK MEDICAL CARE IF:   You think you are having a reaction to medicines taken.  You have recurrent headaches or feel dizzy.  You have swelling in your ankles.  You have trouble with your vision. SEEK IMMEDIATE MEDICAL CARE IF:  You develop a severe headache or confusion.  You have unusual weakness, numbness, or feel faint.  You have severe chest or abdominal pain.  You vomit repeatedly.  You have trouble breathing. MAKE SURE YOU:   Understand these instructions.  Will watch your condition.  Will get help right away if you are not doing well or get worse.   This information is not intended to replace advice given to you by your health care provider. Make sure you discuss any questions you have with your health care provider.   Document Released: 02/18/2005 Document Revised: 07/05/2014 Document Reviewed: 12/11/2012 Elsevier Interactive Patient Education 2016 Elsevier Inc.  
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
## 2015-05-07 ENCOUNTER — Other Ambulatory Visit: Payer: Self-pay | Admitting: Family Medicine

## 2015-05-08 ENCOUNTER — Other Ambulatory Visit: Payer: Self-pay | Admitting: Family Medicine

## 2015-05-13 ENCOUNTER — Emergency Department (HOSPITAL_COMMUNITY): Payer: No Typology Code available for payment source

## 2015-05-13 ENCOUNTER — Encounter (HOSPITAL_COMMUNITY): Payer: Self-pay | Admitting: Emergency Medicine

## 2015-05-13 ENCOUNTER — Emergency Department (HOSPITAL_COMMUNITY)
Admission: EM | Admit: 2015-05-13 | Discharge: 2015-05-13 | Disposition: A | Discharge status: Home / Self Care | Payer: No Typology Code available for payment source | Attending: Emergency Medicine | Admitting: Emergency Medicine

## 2015-05-13 DIAGNOSIS — Z791 Long term (current) use of non-steroidal anti-inflammatories (NSAID): Secondary | ICD-10-CM | POA: Insufficient documentation

## 2015-05-13 DIAGNOSIS — F329 Major depressive disorder, single episode, unspecified: Secondary | ICD-10-CM | POA: Insufficient documentation

## 2015-05-13 DIAGNOSIS — W231XXA Caught, crushed, jammed, or pinched between stationary objects, initial encounter: Secondary | ICD-10-CM | POA: Insufficient documentation

## 2015-05-13 DIAGNOSIS — Z79899 Other long term (current) drug therapy: Secondary | ICD-10-CM | POA: Insufficient documentation

## 2015-05-13 DIAGNOSIS — Z87891 Personal history of nicotine dependence: Secondary | ICD-10-CM | POA: Insufficient documentation

## 2015-05-13 DIAGNOSIS — F4310 Post-traumatic stress disorder, unspecified: Secondary | ICD-10-CM | POA: Insufficient documentation

## 2015-05-13 DIAGNOSIS — Y9389 Activity, other specified: Secondary | ICD-10-CM | POA: Insufficient documentation

## 2015-05-13 DIAGNOSIS — Y9289 Other specified places as the place of occurrence of the external cause: Secondary | ICD-10-CM | POA: Insufficient documentation

## 2015-05-13 DIAGNOSIS — F410 Panic disorder [episodic paroxysmal anxiety] without agoraphobia: Secondary | ICD-10-CM | POA: Insufficient documentation

## 2015-05-13 DIAGNOSIS — S62651A Nondisplaced fracture of medial phalanx of left index finger, initial encounter for closed fracture: Secondary | ICD-10-CM | POA: Insufficient documentation

## 2015-05-13 DIAGNOSIS — S62601A Fracture of unspecified phalanx of left index finger, initial encounter for closed fracture: Secondary | ICD-10-CM

## 2015-05-13 DIAGNOSIS — Z880 Allergy status to penicillin: Secondary | ICD-10-CM | POA: Insufficient documentation

## 2015-05-13 DIAGNOSIS — S61211A Laceration without foreign body of left index finger without damage to nail, initial encounter: Secondary | ICD-10-CM | POA: Insufficient documentation

## 2015-05-13 DIAGNOSIS — Z8739 Personal history of other diseases of the musculoskeletal system and connective tissue: Secondary | ICD-10-CM | POA: Insufficient documentation

## 2015-05-13 DIAGNOSIS — I10 Essential (primary) hypertension: Secondary | ICD-10-CM | POA: Insufficient documentation

## 2015-05-13 DIAGNOSIS — Y998 Other external cause status: Secondary | ICD-10-CM | POA: Insufficient documentation

## 2015-05-13 DIAGNOSIS — E669 Obesity, unspecified: Secondary | ICD-10-CM | POA: Insufficient documentation

## 2015-05-13 DIAGNOSIS — Z8669 Personal history of other diseases of the nervous system and sense organs: Secondary | ICD-10-CM | POA: Insufficient documentation

## 2015-05-13 DIAGNOSIS — K219 Gastro-esophageal reflux disease without esophagitis: Secondary | ICD-10-CM | POA: Insufficient documentation

## 2015-05-13 MED ORDER — HYDROCODONE-ACETAMINOPHEN 5-325 MG PO TABS: 1.0000 | ORAL_TABLET | ORAL | Status: DC | PRN

## 2015-05-13 MED ORDER — LIDOCAINE HCL (PF) 1 % IJ SOLN
30.0000 mL | Freq: Once | INTRAMUSCULAR | Status: DC
  Filled 2015-05-13: qty 30

## 2015-05-13 MED ORDER — DOXYCYCLINE HYCLATE 100 MG PO CAPS: 100.0000 mg | ORAL_CAPSULE | Freq: Two times a day (BID) | ORAL | Status: DC

## 2015-05-13 MED ORDER — HYDROCODONE-ACETAMINOPHEN 5-325 MG PO TABS
1.0000 | ORAL_TABLET | Freq: Once | ORAL | Status: AC
  Administered 2015-05-13: 1 via ORAL
  Filled 2015-05-13: qty 1

## 2015-05-13 NOTE — Discharge Instructions (Signed)
1. Medications: doxycycline, vicodin, usual home medications 2. Treatment: rest, drink plenty of fluids, ice, wear splint 3. Follow Up: please followup with the hand specialist (call this week to make appt) for discussion of your diagnoses and further evaluation after today's visit; if you do not have a primary care doctor use the phone number listed in your discharge paperwork to find one; please return to the ER for new or worsening symptoms    Finger Fracture Fractures of fingers are breaks in the bones of the fingers. There are many types of fractures. There are different ways of treating these fractures. Your health care provider will discuss the best way to treat your fracture. CAUSES Traumatic injury is the main cause of broken fingers. These include:  Injuries while playing sports.  Workplace injuries.  Falls. RISK FACTORS Activities that can increase your risk of finger fractures include:  Sports.  Workplace activities that involve machinery.  A condition called osteoporosis, which can make your bones less dense and cause them to fracture more easily. SIGNS AND SYMPTOMS The main symptoms of a broken finger are pain and swelling within 15 minutes after the injury. Other symptoms include:  Bruising of your finger.  Stiffness of your finger.  Numbness of your finger.  Exposed bones (compound fracture) if the fracture is severe. DIAGNOSIS  The best way to diagnose a broken bone is with X-ray imaging. Additionally, your health care provider will use this X-ray image to evaluate the position of the broken finger bones.  TREATMENT  Finger fractures can be treated with:   Nonreduction--This means the bones are in place. The finger is splinted without changing the positions of the bone pieces. The splint is usually left on for about a week to 10 days. This will depend on your fracture and what your health care provider thinks.  Closed reduction--The bones are put back into  position without using surgery. The finger is then splinted.  Open reduction and internal fixation--The fracture site is opened. Then the bone pieces are fixed into place with pins or some type of hardware. This is seldom required. It depends on the severity of the fracture. HOME CARE INSTRUCTIONS   Follow your health care provider's instructions regarding activities, exercises, and physical therapy.  Only take over-the-counter or prescription medicines for pain, discomfort, or fever as directed by your health care provider. SEEK MEDICAL CARE IF: You have pain or swelling that limits the motion or use of your fingers. SEEK IMMEDIATE MEDICAL CARE IF:  Your finger becomes numb. MAKE SURE YOU:   Understand these instructions.  Will watch your condition.  Will get help right away if you are not doing well or get worse.   This information is not intended to replace advice given to you by your health care provider. Make sure you discuss any questions you have with your health care provider.   Document Released: 06/02/2000 Document Revised: 12/09/2012 Document Reviewed: 09/30/2012 Elsevier Interactive Patient Education [employer]2016 Elsevier Inc.

## 2015-05-13 NOTE — ED Notes (Signed)
Per [facility] pt fell onto the car door then the door closed onto pt's rt index finger causing a laceration. Bleeding controlled prior to ems arrival.

## 2015-05-13 NOTE — ED Notes (Signed)
Pt returned from xray

## 2015-05-13 NOTE — ED Provider Notes (Signed)
CSN: [id number]     Arrival date & time 05/13/15  1558 History  By signing my name below, I, Gonzella LexKimberly Bianca Gray, attest that this documentation has been prepared under the direction and in the presence of Glean HessElizabeth Westfall, [place]PA-C. Electronically Signed: Gonzella LexKimberly Bianca Gray, Scribe. 05/13/2015. 5:12 PM.   Chief Complaint  Patient presents with  . Finger Injury  . Fall    The history is provided by the patient. No language interpreter was used.    HPI Comments: Travis [name] is a 50 y.o. male who presents to the Emergency Department complaining of sudden onset, constant, moderate left index finger pain with small associated lacerations to the top and bottom of his index finger s/p slamming his finger in a vehicle door earlier today. He notes that his pain is exacerbated by movement. He has not taken anything for his pain. He denies numbness and tingling in his left hand or fingers. He states his tetanus is UTD.   Past Medical History  Diagnosis Date  . Hypertension   . Tachycardia - pulse   . Degenerative disk disease   . Spinal stenosis   . Degenerative disk disease   . Tobacco abuse   . Obesity   . Polysubstance abuse   . Chest pain     Hospital, March, 2014, negative enzymes, patient refused in-hospital  stress test, patient canceled outpatient stress test  . Spinal stenosis   . Depression   . Neuromuscular disorder (HCC)   . Anxiety     Panic attacks  . Shortness of breath dyspnea     with exertion  . PTSD (post-traumatic stress disorder)   . Incontinence of urine   . GERD (gastroesophageal reflux disease)   . Constipation    Past Surgical History  Procedure Laterality Date  . Wisdom tooth extraction    . Esophagogastroduodenoscopy (egd) with propofol N/A 08/24/2014    Procedure: ESOPHAGOGASTRODUODENOSCOPY (EGD) WITH PROPOFOL;  Surgeon: Charlott RakesVincent Schooler, MD;  [facility];  Service: Endoscopy;  Laterality: N/A;  . Colonoscopy N/A 08/24/2014    Procedure:  COLONOSCOPY;  Surgeon: Charlott RakesVincent Schooler, MD;  [facility];  Service: Endoscopy;  Laterality: N/A;   Family History  Problem Relation Age of Onset  . Stroke Mother   . Hypertension Mother   . Hyperlipidemia Mother   . Stroke Father   . Hypertension Father   . Dementia Father   . Diabetes Father   . Heart disease Father   . Hyperlipidemia Father   . Cancer Sister     brain  . Diabetes Sister   . Heart disease Sister   . Hyperlipidemia Sister   . Hypertension Sister   . Stroke Sister   . Heart disease Brother   . Hyperlipidemia Brother    Social History  Substance Use Topics  . Smoking status: Former Smoker -- 0.02 packs/day for 27 years    Types: Cigarettes  . Smokeless tobacco: Never Used     Comment: "quit April 2016, I havwe one every now and then"  . Alcohol Use: No      Review of Systems  Musculoskeletal: Positive for arthralgias.       Left index finger  Skin: Positive for wound.  Neurological: Negative for weakness and numbness.       Negative for tingling    Allergies  Blueberry flavor; Penicillins; Robaxin; Flexeril; and Toradol  Home Medications   Prior to Admission medications   Medication Sig Start Date End Date Taking? Authorizing Provider  baclofen (LIORESAL) 10 MG tablet Take 10 mg by mouth 3 (three) times daily.    Historical Provider, MD  cloNIDine (CATAPRES) 0.3 MG tablet TAKE ONE TABLET BY MOUTH TWICE DAILY 05/10/15   Sherren Mocha, MD  diazepam (VALIUM) 5 MG tablet Take 1 tablet (5 mg total) by mouth every 8 (eight) hours as needed for muscle spasms. 11/21/14   Shon Baton, MD  doxycycline (VIBRAMYCIN) 100 MG capsule Take 1 capsule (100 mg total) by mouth 2 (two) times daily. 05/13/15   Mady Gemma, PA-C  HYDROcodone-acetaminophen (NORCO/VICODIN) 5-325 MG tablet Take 1 tablet by mouth every 4 (four) hours as needed. 05/13/15   Mady Gemma, PA-C  ibuprofen (ADVIL,MOTRIN) 800 MG tablet Take 1 tablet (800 mg total) by  mouth 3 (three) times daily. 03/18/15   Danelle Berry, PA-C  lisinopril (PRINIVIL,ZESTRIL) 20 MG tablet Take 1 tablet (20 mg total) by mouth every morning. For high blood pressure 02/11/15   Dione Booze, MD  metoprolol (LOPRESSOR) 50 MG tablet Take 1 tablet (50 mg total) by mouth 2 (two) times daily. 04/15/15   Nelva Nay, MD  ranitidine (ZANTAC) 150 MG tablet Take 1 tablet (150 mg total) by mouth daily as needed for heartburn. 02/11/15   Dione Booze, MD  traMADol (ULTRAM) 50 MG tablet Take 2 tablets (100 mg total) by mouth every 6 (six) hours as needed. Do not use >8 tabs/day. Patient taking differently: Take 100 mg by mouth every 6 (six) hours as needed for moderate pain. Do not use >8 tabs/day. 02/11/15   Dione Booze, MD  venlafaxine (EFFEXOR) 25 MG tablet Take 25 mg by mouth 2 (two) times daily.    Historical Provider, MD    BP 160/104 mmHg  Pulse 88  Temp(Src) 98.7 F (37.1 C) (Oral)  Resp 18  SpO2 98% Physical Exam  Constitutional: He is oriented to person, place, and time. He appears well-developed and well-nourished. No distress.  HENT:  Head: Normocephalic and atraumatic.  Right Ear: External ear normal.  Left Ear: External ear normal.  Nose: Nose normal.  Eyes: Conjunctivae and EOM are normal. Right eye exhibits no discharge. Left eye exhibits no discharge. No scleral icterus.  Neck: Normal range of motion. Neck supple.  Cardiovascular: Normal rate, regular rhythm and intact distal pulses.   Pulmonary/Chest: Effort normal and breath sounds normal. No respiratory distress.  Musculoskeletal: He exhibits edema and tenderness.  Diffuse TTP to left index finger with associated edema and decreased ROM due to pain. Sensation to light touch intact. Cap refill < 3 seconds. Laceration to dorsal aspect of left index finger and palmar aspect of left index finger, hemostatic.  Neurological: He is alert and oriented to person, place, and time. No sensory deficit.  Skin: Skin is warm and dry.  He is not diaphoretic.  Psychiatric: He has a normal mood and affect. His behavior is normal.  Nursing note and vitals reviewed.   ED Course  Procedures   DIAGNOSTIC STUDIES: Oxygen Saturation is 98% on RA, normal by my interpretation.  COORDINATION OF CARE: 5:09 PM Will review x -ray and will administer pain medication. Discussed treatment plan with pt at bedside and pt agreed to plan.   Imaging Review Dg Finger Index Left  05/13/2015  CLINICAL DATA:  Fall.  Laceration.  Shut finger in car door. EXAM: LEFT INDEX FINGER 2+V COMPARISON:  None. FINDINGS: No foreign body identified. There is a fracture through the middle second phalanx without significant displacement. No other acute  abnormalities. IMPRESSION: Nondisplaced fracture of the middle second phalanx. Electronically Signed   By: Gerome Sam III M.D   On: 05/13/2015 17:16   I have personally reviewed and evaluated these images and lab results as part of my medical decision-making.  MDM   Final diagnoses:  Fracture of phalanx of left index finger, closed, initial encounter    50 year old male presents with left index finger pain after slamming his finger in a car door today prior to arrival. States his tetanus is up to date. On exam, patient has diffuse TTP to his left index finger with associated edema and decreased ROM due to pain. Patient is NVI. Laceration to dorsal aspect of left index finger and palmar aspect of left index finger, hemostatic. Will obtain imaging of left index finger, clean wound, and give pain medication.  Imaging remarkable for nondisplaced fracture of the middle second phalanx. Lacerations are superficial and do not require sutures. Patient's wounds do no appear to communicate with fracture. Wounds cleaned and dressed and finger placed in splint. Patient is non-toxic and well-appearing, feel he is stable for discharge at this time. Will discharge with doxycycline and short course of pain medication.  Patient to follow-up with hand surgery. Strict return precautions discussed. Patient verbalizes his understanding and is in agreement with plan.  BP 160/104 mmHg  Pulse 88  Temp(Src) 98.7 F (37.1 C) (Oral)  Resp 18  SpO2 98%  I personally performed the services described in this documentation, which was scribed in my presence. The recorded information has been reviewed and is accurate.  Mady Gemma, PA-C 05/13/15 2008  Rolland Porter, MD 05/22/15 1452

## 2015-05-13 NOTE — ED Notes (Signed)
Pt states he has a ride home

## 2015-05-13 NOTE — ED Notes (Signed)
Patient presents for fall and left index finger injury. Small lacerations noted to top and bottom of index finger. No obvious deformity noted. Bleeding controlled at this time.

## 2015-05-26 ENCOUNTER — Encounter (HOSPITAL_COMMUNITY): Payer: Self-pay | Admitting: Emergency Medicine

## 2015-05-26 ENCOUNTER — Emergency Department (HOSPITAL_COMMUNITY)
Admission: EM | Admit: 2015-05-26 | Discharge: 2015-05-26 | Disposition: A | Discharge status: Home / Self Care | Payer: No Typology Code available for payment source | Attending: Emergency Medicine | Admitting: Emergency Medicine

## 2015-05-26 DIAGNOSIS — Z79899 Other long term (current) drug therapy: Secondary | ICD-10-CM | POA: Insufficient documentation

## 2015-05-26 DIAGNOSIS — E669 Obesity, unspecified: Secondary | ICD-10-CM | POA: Insufficient documentation

## 2015-05-26 DIAGNOSIS — K219 Gastro-esophageal reflux disease without esophagitis: Secondary | ICD-10-CM | POA: Insufficient documentation

## 2015-05-26 DIAGNOSIS — Z87891 Personal history of nicotine dependence: Secondary | ICD-10-CM | POA: Insufficient documentation

## 2015-05-26 DIAGNOSIS — I10 Essential (primary) hypertension: Secondary | ICD-10-CM | POA: Insufficient documentation

## 2015-05-26 DIAGNOSIS — Z760 Encounter for issue of repeat prescription: Secondary | ICD-10-CM | POA: Insufficient documentation

## 2015-05-26 DIAGNOSIS — F329 Major depressive disorder, single episode, unspecified: Secondary | ICD-10-CM | POA: Insufficient documentation

## 2015-05-26 DIAGNOSIS — Z880 Allergy status to penicillin: Secondary | ICD-10-CM | POA: Insufficient documentation

## 2015-05-26 MED ORDER — LISINOPRIL 20 MG PO TABS: 20.0000 mg | ORAL_TABLET | Freq: Every day | ORAL | Status: DC

## 2015-05-26 MED ORDER — METOPROLOL TARTRATE 50 MG PO TABS: 50.0000 mg | ORAL_TABLET | Freq: Two times a day (BID) | ORAL | Status: DC

## 2015-05-26 MED ORDER — TRAMADOL HCL 50 MG PO TABS: 50.0000 mg | ORAL_TABLET | Freq: Four times a day (QID) | ORAL | Status: DC | PRN

## 2015-05-26 MED ORDER — LISINOPRIL 20 MG PO TABS
20.0000 mg | ORAL_TABLET | Freq: Once | ORAL | Status: DC
  Filled 2015-05-26: qty 1

## 2015-05-26 MED ORDER — TRAMADOL HCL 50 MG PO TABS
100.0000 mg | ORAL_TABLET | Freq: Once | ORAL | Status: AC
  Administered 2015-05-26: 100 mg via ORAL
  Filled 2015-05-26: qty 2

## 2015-05-26 MED ORDER — CLONIDINE HCL 0.1 MG PO TABS
0.3000 mg | ORAL_TABLET | Freq: Once | ORAL | Status: AC
  Administered 2015-05-26: 0.3 mg via ORAL
  Filled 2015-05-26: qty 3

## 2015-05-26 MED ORDER — METOPROLOL TARTRATE 25 MG PO TABS
25.0000 mg | ORAL_TABLET | Freq: Once | ORAL | Status: AC
  Administered 2015-05-26: 25 mg via ORAL
  Filled 2015-05-26: qty 1

## 2015-05-26 MED ORDER — CLONIDINE HCL 0.3 MG PO TABS: 0.3000 mg | ORAL_TABLET | Freq: Two times a day (BID) | ORAL | Status: DC
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
## 2015-05-26 NOTE — ED Notes (Signed)
Pt refusing lisinopril and BP asking for RX and ready to leave.

## 2015-05-26 NOTE — Discharge Instructions (Signed)
You have been seen today for medication refill. Follow up with PCP as as soon as possible for reevaluation and chronic management of your medical problems. Return to ED should symptoms worsen.

## 2015-05-26 NOTE — ED Notes (Signed)
Bed: WA28 Expected date:  Expected time:  Means of arrival:  Comments: 

## 2015-06-07 ENCOUNTER — Other Ambulatory Visit: Payer: Self-pay | Admitting: Family Medicine

## 2015-06-07 NOTE — Telephone Encounter (Signed)
Hi Eva- I have never seen this pt that I can see. Would you be able to address this refill? thanks

## 2015-06-09 NOTE — Telephone Encounter (Signed)
I have not seen pt in a year.  He has a PCP elsewhere - in his last ED note sev days ago, the ER doc notes that pt has an appt with his PCP on 4/13. He can come into the walk-in clinic if he cannot get in touch w/ his PCP for a refill.

## 2015-06-18 ENCOUNTER — Encounter (HOSPITAL_COMMUNITY): Payer: Self-pay | Admitting: Emergency Medicine

## 2015-06-18 ENCOUNTER — Emergency Department (HOSPITAL_COMMUNITY)
Admission: EM | Admit: 2015-06-18 | Discharge: 2015-06-18 | Disposition: A | Discharge status: Home / Self Care | Payer: No Typology Code available for payment source | Attending: Emergency Medicine | Admitting: Emergency Medicine

## 2015-06-18 DIAGNOSIS — E669 Obesity, unspecified: Secondary | ICD-10-CM | POA: Insufficient documentation

## 2015-06-18 DIAGNOSIS — I10 Essential (primary) hypertension: Secondary | ICD-10-CM

## 2015-06-18 DIAGNOSIS — Z880 Allergy status to penicillin: Secondary | ICD-10-CM | POA: Insufficient documentation

## 2015-06-18 DIAGNOSIS — Z8739 Personal history of other diseases of the musculoskeletal system and connective tissue: Secondary | ICD-10-CM | POA: Insufficient documentation

## 2015-06-18 DIAGNOSIS — F419 Anxiety disorder, unspecified: Secondary | ICD-10-CM | POA: Insufficient documentation

## 2015-06-18 DIAGNOSIS — Z79899 Other long term (current) drug therapy: Secondary | ICD-10-CM | POA: Insufficient documentation

## 2015-06-18 DIAGNOSIS — K219 Gastro-esophageal reflux disease without esophagitis: Secondary | ICD-10-CM | POA: Insufficient documentation

## 2015-06-18 DIAGNOSIS — F329 Major depressive disorder, single episode, unspecified: Secondary | ICD-10-CM | POA: Insufficient documentation

## 2015-06-18 DIAGNOSIS — F4310 Post-traumatic stress disorder, unspecified: Secondary | ICD-10-CM | POA: Insufficient documentation

## 2015-06-18 DIAGNOSIS — G894 Chronic pain syndrome: Secondary | ICD-10-CM

## 2015-06-18 DIAGNOSIS — Z8669 Personal history of other diseases of the nervous system and sense organs: Secondary | ICD-10-CM | POA: Insufficient documentation

## 2015-06-18 DIAGNOSIS — Z760 Encounter for issue of repeat prescription: Secondary | ICD-10-CM | POA: Insufficient documentation

## 2015-06-18 DIAGNOSIS — Z87891 Personal history of nicotine dependence: Secondary | ICD-10-CM | POA: Insufficient documentation

## 2015-06-18 MED ORDER — TRAMADOL HCL 50 MG PO TABS
50.0000 mg | ORAL_TABLET | Freq: Once | ORAL | Status: AC
Start: 2015-06-18 — End: 2015-06-18
  Administered 2015-06-18: 50 mg via ORAL
  Filled 2015-06-18: qty 1

## 2015-06-18 MED ORDER — TRAMADOL HCL 50 MG PO TABS: ORAL_TABLET | ORAL | Status: DC

## 2015-06-18 MED ORDER — LISINOPRIL 20 MG PO TABS
20.0000 mg | ORAL_TABLET | Freq: Once | ORAL | Status: AC
  Administered 2015-06-18: 20 mg via ORAL
  Filled 2015-06-18: qty 1

## 2015-06-18 MED ORDER — CLONIDINE HCL 0.3 MG PO TABS: 0.3000 mg | ORAL_TABLET | Freq: Two times a day (BID) | ORAL | Status: DC

## 2015-06-18 MED ORDER — METOPROLOL TARTRATE 50 MG PO TABS: 50.0000 mg | ORAL_TABLET | Freq: Two times a day (BID) | ORAL | Status: DC

## 2015-06-18 MED ORDER — METOPROLOL TARTRATE 25 MG PO TABS
25.0000 mg | ORAL_TABLET | Freq: Once | ORAL | Status: AC
  Administered 2015-06-18: 25 mg via ORAL
  Filled 2015-06-18: qty 1

## 2015-06-18 MED ORDER — LISINOPRIL 20 MG PO TABS: 20.0000 mg | ORAL_TABLET | Freq: Every day | ORAL | Status: DC

## 2015-06-18 MED ORDER — CLONIDINE HCL 0.1 MG PO TABS
0.3000 mg | ORAL_TABLET | Freq: Once | ORAL | Status: AC
  Administered 2015-06-18: 0.3 mg via ORAL
  Filled 2015-06-18: qty 1

## 2015-06-18 NOTE — ED Notes (Signed)
Pt reports hx of hypertension, reports he's been out of his meds since Friday. Pt reports he decided to check his bp when he started having a headache and got a reading in the 200's. Pt a/o x4, resp e/u, nad.
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
## 2015-06-18 NOTE — Discharge Instructions (Signed)
Medicine Refill at the Emergency Department We have refilled your medicine today, but it is best for you to get refills through your primary health care provider's office. In the future, please plan ahead so you do not need to get refills from the emergency department. If the medicine we refilled was a maintenance medicine, you may have received only enough to get you by until you are able to see your regular health care provider.   This information is not intended to replace advice given to you by your health care provider. Make sure you discuss any questions you have with your health care provider.   Document Released: 06/07/2003 Document Revised: 03/11/2014 Document Reviewed: 05/28/2013 Elsevier Interactive Patient Education 2016 Elsevier Inc.  Chronic Pain Chronic pain can be defined as pain that is off and on and lasts for 3-6 months or longer. Many things cause chronic pain, which can make it difficult to make a diagnosis. There are many treatment options available for chronic pain. However, finding a treatment that works well for you may require trying various approaches until the right one is found. Many people benefit from a combination of two or more types of treatment to control their pain. SYMPTOMS  Chronic pain can occur anywhere in the body and can range from mild to very severe. Some types of chronic pain include:  Headache.  Low back pain.  Cancer pain.  Arthritis pain.  Neurogenic pain. This is pain resulting from damage to nerves. People with chronic pain may also have other symptoms such as:  Depression.  Anger.  Insomnia.  Anxiety. DIAGNOSIS  Your health care provider will help diagnose your condition over time. In many cases, the initial focus will be on excluding possible conditions that could be causing the pain. Depending on your symptoms, your health care provider may order tests to diagnose your condition. Some of these tests may include:   Blood tests.    CT scan.   MRI.   X-rays.   Ultrasounds.   Nerve conduction studies.  You may need to see a specialist.  TREATMENT  Finding treatment that works well may take time. You may be referred to a pain specialist. He or she may prescribe medicine or therapies, such as:   Mindful meditation or yoga.  Shots (injections) of numbing or pain-relieving medicines into the spine or area of pain.  Local electrical stimulation.  Acupuncture.   Massage therapy.   Aroma, color, light, or sound therapy.   Biofeedback.   Working with a physical therapist to keep from getting stiff.   Regular, gentle exercise.   Cognitive or behavioral therapy.   Group support.  Sometimes, surgery may be recommended.  HOME CARE INSTRUCTIONS   Take all medicines as directed by your health care provider.   Lessen stress in your life by relaxing and doing things such as listening to calming music.   Exercise or be active as directed by your health care provider.   Eat a healthy diet and include things such as vegetables, fruits, fish, and lean meats in your diet.   Keep all follow-up appointments with your health care provider.   Attend a support group with others suffering from chronic pain. SEEK MEDICAL CARE IF:   Your pain gets worse.   You develop a new pain that was not there before.   You cannot tolerate medicines given to you by your health care provider.   You have new symptoms since your last visit with your health care  provider.  SEEK IMMEDIATE MEDICAL CARE IF:   You feel weak.   You have decreased sensation or numbness.   You lose control of bowel or bladder function.   Your pain suddenly gets much worse.   You develop shaking.  You develop chills.  You develop confusion.  You develop chest pain.  You develop shortness of breath.  MAKE SURE YOU:  Understand these instructions.  Will watch your condition.  Will get help right away if  you are not doing well or get worse.   This information is not intended to replace advice given to you by your health care provider. Make sure you discuss any questions you have with your health care provider.   Document Released: 11/10/2001 Document Revised: 10/21/2012 Document Reviewed: 08/14/2012 Elsevier Interactive Patient Education 2016 Elsevier Inc.  DASH Eating Plan DASH stands for "Dietary Approaches to Stop Hypertension." The DASH eating plan is a healthy eating plan that has been shown to reduce high blood pressure (hypertension). Additional health benefits may include reducing the risk of type 2 diabetes mellitus, heart disease, and stroke. The DASH eating plan may also help with weight loss. WHAT DO I NEED TO KNOW ABOUT THE DASH EATING PLAN? For the DASH eating plan, you will follow these general guidelines:  Choose foods with a percent daily value for sodium of less than 5% (as listed on the food label).  Use salt-free seasonings or herbs instead of table salt or sea salt.  Check with your health care provider or pharmacist before using salt substitutes.  Eat lower-sodium products, often labeled as "lower sodium" or "no salt added."  Eat fresh foods.  Eat more vegetables, fruits, and low-fat dairy products.  Choose whole grains. Look for the word "whole" as the first word in the ingredient list.  Choose fish and skinless chicken or [place] more often than red meat. Limit fish, poultry, and meat to 6 oz (170 g) each day.  Limit sweets, desserts, sugars, and sugary drinks.  Choose heart-healthy fats.  Limit cheese to 1 oz (28 g) per day.  Eat more home-cooked food and less restaurant, buffet, and fast food.  Limit fried foods.  Cook foods using methods other than frying.  Limit canned vegetables. If you do use them, rinse them well to decrease the sodium.  When eating at a restaurant, ask that your food be prepared with less salt, or no salt if possible. WHAT  FOODS CAN I EAT? Seek help from a dietitian for individual calorie needs. Grains Whole grain or whole wheat bread. Brown rice. Whole grain or whole wheat pasta. Quinoa, bulgur, and whole grain cereals. Low-sodium cereals. Corn or whole wheat flour tortillas. Whole grain cornbread. Whole grain crackers. Low-sodium crackers. Vegetables Fresh or frozen vegetables (raw, steamed, roasted, or grilled). Low-sodium or reduced-sodium tomato and vegetable juices. Low-sodium or reduced-sodium tomato sauce and paste. Low-sodium or reduced-sodium canned vegetables.  Fruits All fresh, canned (in natural juice), or frozen fruits. Meat and Other Protein Products Ground beef (85% or leaner), grass-fed beef, or beef trimmed of fat. Skinless chicken or [place]. Ground chicken or [place]. Pork trimmed of fat. All fish and seafood. Eggs. Dried beans, peas, or lentils. Unsalted nuts and seeds. Unsalted canned beans. Dairy Low-fat dairy products, such as skim or 1% milk, 2% or reduced-fat cheeses, low-fat ricotta or cottage cheese, or plain low-fat yogurt. Low-sodium or reduced-sodium cheeses. Fats and Oils Tub margarines without trans fats. Light or reduced-fat mayonnaise and salad dressings (reduced sodium). Avocado. Safflower,  olive, or canola oils. Natural peanut or almond butter. Other Unsalted popcorn and pretzels. The items listed above may not be a complete list of recommended foods or beverages. Contact your dietitian for more options. WHAT FOODS ARE NOT RECOMMENDED? Grains White bread. White pasta. White rice. Refined cornbread. Bagels and croissants. Crackers that contain trans fat. Vegetables Creamed or fried vegetables. Vegetables in a cheese sauce. Regular canned vegetables. Regular canned tomato sauce and paste. Regular tomato and vegetable juices. Fruits Dried fruits. Canned fruit in light or heavy syrup. Fruit juice. Meat and Other Protein Products Fatty cuts of meat. Ribs, chicken wings, bacon,  sausage, bologna, salami, chitterlings, fatback, hot dogs, bratwurst, and packaged luncheon meats. Salted nuts and seeds. Canned beans with salt. Dairy Whole or 2% milk, cream, half-and-half, and cream cheese. Whole-fat or sweetened yogurt. Full-fat cheeses or blue cheese. Nondairy creamers and whipped toppings. Processed cheese, cheese spreads, or cheese curds. Condiments Onion and garlic salt, seasoned salt, table salt, and sea salt. Canned and packaged gravies. Worcestershire sauce. Tartar sauce. Barbecue sauce. Teriyaki sauce. Soy sauce, including reduced sodium. Steak sauce. Fish sauce. Oyster sauce. Cocktail sauce. Horseradish. Ketchup and mustard. Meat flavorings and tenderizers. Bouillon cubes. Hot sauce. Tabasco sauce. Marinades. Taco seasonings. Relishes. Fats and Oils Butter, stick margarine, lard, shortening, ghee, and bacon fat. Coconut, palm kernel, or palm oils. Regular salad dressings. Other Pickles and olives. Salted popcorn and pretzels. The items listed above may not be a complete list of foods and beverages to avoid. Contact your dietitian for more information. WHERE CAN I FIND MORE INFORMATION? National Heart, Lung, and Blood Institute: CableProm[employer].itwww.nhlbi.nih.gov/health/health-topics/topics/dash/   This information is not intended to replace advice given to you by your health care provider. Make sure you discuss any questions you have with your health care provider.   Document Released: 02/07/2011 Document Revised: 03/11/2014 Document Reviewed: 12/23/2012 Elsevier Interactive Patient Education [employer]2016 Elsevier Inc.

## 2015-06-25 ENCOUNTER — Encounter (HOSPITAL_COMMUNITY): Payer: Self-pay | Admitting: Emergency Medicine

## 2015-06-25 ENCOUNTER — Emergency Department (HOSPITAL_COMMUNITY)
Admission: EM | Admit: 2015-06-25 | Discharge: 2015-06-25 | Disposition: A | Discharge status: Home / Self Care | Payer: No Typology Code available for payment source | Attending: Emergency Medicine | Admitting: Emergency Medicine

## 2015-06-25 ENCOUNTER — Emergency Department (HOSPITAL_COMMUNITY): Payer: No Typology Code available for payment source

## 2015-06-25 DIAGNOSIS — F329 Major depressive disorder, single episode, unspecified: Secondary | ICD-10-CM | POA: Insufficient documentation

## 2015-06-25 DIAGNOSIS — Z87891 Personal history of nicotine dependence: Secondary | ICD-10-CM | POA: Insufficient documentation

## 2015-06-25 DIAGNOSIS — Z791 Long term (current) use of non-steroidal anti-inflammatories (NSAID): Secondary | ICD-10-CM | POA: Insufficient documentation

## 2015-06-25 DIAGNOSIS — I10 Essential (primary) hypertension: Secondary | ICD-10-CM | POA: Insufficient documentation

## 2015-06-25 DIAGNOSIS — Z792 Long term (current) use of antibiotics: Secondary | ICD-10-CM | POA: Insufficient documentation

## 2015-06-25 DIAGNOSIS — Z880 Allergy status to penicillin: Secondary | ICD-10-CM | POA: Insufficient documentation

## 2015-06-25 DIAGNOSIS — Z79899 Other long term (current) drug therapy: Secondary | ICD-10-CM | POA: Insufficient documentation

## 2015-06-25 DIAGNOSIS — R519 Headache, unspecified: Secondary | ICD-10-CM

## 2015-06-25 DIAGNOSIS — R51 Headache: Secondary | ICD-10-CM

## 2015-06-25 DIAGNOSIS — F4310 Post-traumatic stress disorder, unspecified: Secondary | ICD-10-CM | POA: Insufficient documentation

## 2015-06-25 DIAGNOSIS — E669 Obesity, unspecified: Secondary | ICD-10-CM | POA: Insufficient documentation

## 2015-06-25 DIAGNOSIS — F410 Panic disorder [episodic paroxysmal anxiety] without agoraphobia: Secondary | ICD-10-CM | POA: Insufficient documentation

## 2015-06-25 DIAGNOSIS — K219 Gastro-esophageal reflux disease without esophagitis: Secondary | ICD-10-CM | POA: Insufficient documentation

## 2015-06-25 LAB — CBC
HEMATOCRIT: 46.9 % (ref 39.0–52.0)
HEMOGLOBIN: 15.9 g/dL (ref 13.0–17.0)
MCH: 28.6 pg (ref 26.0–34.0)
MCHC: 33.9 g/dL (ref 30.0–36.0)
MCV: 84.5 fL (ref 78.0–100.0)
Platelets: 178 10*3/uL (ref 150–400)
RBC: 5.55 MIL/uL (ref 4.22–5.81)
RDW: 13.5 % (ref 11.5–15.5)
WBC: 11.7 10*3/uL — ABNORMAL HIGH (ref 4.0–10.5)

## 2015-06-25 LAB — I-STAT TROPONIN, ED: TROPONIN I, POC: 0 ng/mL (ref 0.00–0.08)

## 2015-06-25 LAB — BASIC METABOLIC PANEL
ANION GAP: 8 (ref 5–15)
BUN: 11 mg/dL (ref 6–20)
CALCIUM: 9.2 mg/dL (ref 8.9–10.3)
CO2: 27 mmol/L (ref 22–32)
Chloride: 106 mmol/L (ref 101–111)
Creatinine, Ser: 1 mg/dL (ref 0.61–1.24)
Glucose, Bld: 126 mg/dL — ABNORMAL HIGH (ref 65–99)
POTASSIUM: 3.5 mmol/L (ref 3.5–5.1)
Sodium: 141 mmol/L (ref 135–145)

## 2015-06-25 MED ORDER — METOPROLOL TARTRATE 25 MG PO TABS: 25.0000 mg | ORAL_TABLET | Freq: Two times a day (BID) | ORAL | Status: DC

## 2015-06-25 MED ORDER — CLONIDINE HCL 0.1 MG PO TABS
0.3000 mg | ORAL_TABLET | Freq: Once | ORAL | Status: AC
  Administered 2015-06-25: 0.3 mg via ORAL
  Filled 2015-06-25: qty 3

## 2015-06-25 MED ORDER — METOPROLOL TARTRATE 25 MG PO TABS
25.0000 mg | ORAL_TABLET | Freq: Once | ORAL | Status: AC
  Administered 2015-06-25: 25 mg via ORAL
  Filled 2015-06-25: qty 1

## 2015-06-25 MED ORDER — CLONIDINE HCL 0.3 MG PO TABS: 0.3000 mg | ORAL_TABLET | Freq: Two times a day (BID) | ORAL | Status: DC

## 2015-06-25 MED ORDER — LISINOPRIL 20 MG PO TABS
20.0000 mg | ORAL_TABLET | Freq: Once | ORAL | Status: AC
  Administered 2015-06-25: 20 mg via ORAL
  Filled 2015-06-25: qty 1

## 2015-06-25 MED ORDER — LISINOPRIL 20 MG PO TABS: 20.0000 mg | ORAL_TABLET | Freq: Every day | ORAL | Status: DC

## 2015-06-25 NOTE — ED Notes (Addendum)
Pt presents from home c/o 10/10 "pressure" headache and hypertension, systolic over 200 per pt report at home.  He is diaphoretic and anxious on arrival, BP in triage 168/133.  Also reporting dizziness and SOB.  By the end of the assessment he has become tearful and is holding his head.

## 2015-06-25 NOTE — ED Provider Notes (Signed)
CSN: [id number]     Arrival date & time 06/25/15  0128 History  By signing my name below, I, Phillis HaggisGabriella Gaje, attest that this documentation has been prepared under the direction and in the presence of Geoffery Lyonsouglas Rees Santistevan, MD. Electronically Signed: Phillis HaggisGabriella Gaje, ED Scribe. 06/25/2015. 2:14 AM.  Chief Complaint  Patient presents with  . Headache   Patient is a 50 y.o. male presenting with headaches. The history is provided by the patient. No language interpreter was used.  Headache Pain location:  Generalized Quality: pressured. Severity currently:  10/10 Severity at highest:  10/10 Timing:  Constant Progression:  Worsening Chronicity:  New Ineffective treatments:  None tried Associated symptoms: dizziness   HPI Comments: Irene LimboJames Memmott is a 50 y.o. Male with a hx of HTN, polysubstance abuse, and neuromuscular disorder who presents to the Emergency Department complaining of 10/10 pressured headache onset earlier this evening. He reports associated dizziness. Pt reports that his BP is high, over 200 systolic measured at home. Pt is prescribed Clonidine, Lisinopril, and metoprolol for his HTN but is currently out of his medication. He has not taken anything for his symptoms PTA. He denies chest pain or SOB.   Past Medical History  Diagnosis Date  . Hypertension   . Tachycardia - pulse   . Degenerative disk disease   . Spinal stenosis   . Degenerative disk disease   . Tobacco abuse   . Obesity   . Polysubstance abuse   . Chest pain     Hospital, March, 2014, negative enzymes, patient refused in-hospital  stress test, patient canceled outpatient stress test  . Spinal stenosis   . Depression   . Neuromuscular disorder (HCC)   . Anxiety     Panic attacks  . Shortness of breath dyspnea     with exertion  . PTSD (post-traumatic stress disorder)   . Incontinence of urine   . GERD (gastroesophageal reflux disease)   . Constipation    Past Surgical History  Procedure Laterality Date  .  Wisdom tooth extraction    . Esophagogastroduodenoscopy (egd) with propofol N/A 08/24/2014    Procedure: ESOPHAGOGASTRODUODENOSCOPY (EGD) WITH PROPOFOL;  Surgeon: Charlott RakesVincent Schooler, MD;  [facility];  Service: Endoscopy;  Laterality: N/A;  . Colonoscopy N/A 08/24/2014    Procedure: COLONOSCOPY;  Surgeon: Charlott RakesVincent Schooler, MD;  [facility];  Service: Endoscopy;  Laterality: N/A;   Family History  Problem Relation Age of Onset  . Stroke Mother   . Hypertension Mother   . Hyperlipidemia Mother   . Stroke Father   . Hypertension Father   . Dementia Father   . Diabetes Father   . Heart disease Father   . Hyperlipidemia Father   . Cancer Sister     brain  . Diabetes Sister   . Heart disease Sister   . Hyperlipidemia Sister   . Hypertension Sister   . Stroke Sister   . Heart disease Brother   . Hyperlipidemia Brother    Social History  Substance Use Topics  . Smoking status: Former Smoker -- 0.02 packs/day for 27 years    Types: Cigarettes  . Smokeless tobacco: Never Used     Comment: "quit April 2016, I havwe one every now and then"  . Alcohol Use: No    Review of Systems  Respiratory: Negative for shortness of breath.   Cardiovascular: Negative for chest pain.  Neurological: Positive for dizziness and headaches.  All other systems reviewed and are negative.  Allergies  Blueberry  flavor; Penicillins; Robaxin; Flexeril; and Toradol  Home Medications   Prior to Admission medications   Medication Sig Start Date End Date Taking? Authorizing Provider  baclofen (LIORESAL) 10 MG tablet Take 10 mg by mouth 3 (three) times daily.    Historical Provider, MD  cloNIDine (CATAPRES) 0.3 MG tablet TAKE ONE TABLET BY MOUTH TWICE DAILY 05/10/15   Sherren Mocha, MD  cloNIDine (CATAPRES) 0.3 MG tablet Take 1 tablet (0.3 mg total) by mouth 2 (two) times daily. 06/18/15   Danelle Berry, PA-C  diazepam (VALIUM) 5 MG tablet Take 1 tablet (5 mg total) by mouth every 8 (eight) hours  as needed for muscle spasms. 11/21/14   Shon Baton, MD  doxycycline (VIBRAMYCIN) 100 MG capsule Take 1 capsule (100 mg total) by mouth 2 (two) times daily. 05/13/15   Mady Gemma, PA-C  HYDROcodone-acetaminophen (NORCO/VICODIN) 5-325 MG tablet Take 1 tablet by mouth every 4 (four) hours as needed. 05/13/15   Mady Gemma, PA-C  ibuprofen (ADVIL,MOTRIN) 800 MG tablet Take 1 tablet (800 mg total) by mouth 3 (three) times daily. 03/18/15   Danelle Berry, PA-C  lisinopril (PRINIVIL,ZESTRIL) 20 MG tablet Take 1 tablet (20 mg total) by mouth every morning. For high blood pressure 02/11/15   Dione Booze, MD  lisinopril (PRINIVIL,ZESTRIL) 20 MG tablet Take 1 tablet (20 mg total) by mouth daily. 06/18/15   Danelle Berry, PA-C  metoprolol (LOPRESSOR) 50 MG tablet Take 1 tablet (50 mg total) by mouth 2 (two) times daily. 04/15/15   Nelva Nay, MD  metoprolol (LOPRESSOR) 50 MG tablet Take 1 tablet (50 mg total) by mouth 2 (two) times daily. 06/18/15   Danelle Berry, PA-C  ranitidine (ZANTAC) 150 MG tablet Take 1 tablet (150 mg total) by mouth daily as needed for heartburn. 02/11/15   Dione Booze, MD  traMADol (ULTRAM) 50 MG tablet Take 1 tablet (50 mg total) by mouth every 6 (six) hours as needed. 05/26/15   Shawn C Joy, PA-C  traMADol (ULTRAM) 50 MG tablet Take 1-2 tablets by mouth every six hours as needed for pain.  Do not use >8 tabs/day. 06/18/15   Danelle Berry, PA-C  venlafaxine (EFFEXOR) 25 MG tablet Take 25 mg by mouth 2 (two) times daily.    Historical Provider, MD   BP 168/133 mmHg  Pulse 85  Temp(Src) 98.6 F (37 C) (Oral)  Resp 20  Ht  (1.753 m)  Wt 300 lb (136.079 kg)  BMI 44.28 kg/m2  SpO2 99% Physical Exam  Constitutional: He is oriented to person, place, and time. He appears well-developed and well-nourished.  Tearful  HENT:  Head: Normocephalic and atraumatic.  Mouth/Throat: Oropharynx is clear and moist.  Eyes: EOM are normal. Pupils are equal, round, and reactive to  light.  Neck: Normal range of motion.  Cardiovascular: Normal rate, regular rhythm, normal heart sounds and intact distal pulses.   Pulmonary/Chest: Effort normal and breath sounds normal. No respiratory distress.  Abdominal: Soft. He exhibits no distension. There is no tenderness.  Musculoskeletal: Normal range of motion.  Neurological: He is alert and oriented to person, place, and time. No cranial nerve deficit. He exhibits normal muscle tone. Coordination normal.  Skin: Skin is warm and dry.  Psychiatric: He has a normal mood and affect. Judgment normal.  Nursing note and vitals reviewed.   ED Course  Procedures (including critical care time) DIAGNOSTIC STUDIES: Oxygen Saturation is 99% on RA, normal by my interpretation.    COORDINATION OF CARE:  2:12 AM-Discussed treatment plan which includes labs and pain medication with pt at bedside and pt agreed to plan.    Labs Review Labs Reviewed  BASIC METABOLIC PANEL - Abnormal; Notable for the following:    Glucose, Bld 126 (*)    All other components within normal limits  CBC - Abnormal; Notable for the following:    WBC 11.7 (*)    All other components within normal limits  I-STAT TROPOININ, ED    Imaging Review Dg Chest 2 View  06/25/2015  CLINICAL DATA:  Shortness of breath, chest pressure and diaphoresis. EXAM: CHEST  2 VIEW COMPARISON:  04/06/2015 FINDINGS: The cardiomediastinal contours are normal. Low lung volumes. Pulmonary vasculature is normal. No consolidation, pleural effusion, or pneumothorax. No acute osseous abnormalities are seen. IMPRESSION: No acute process. Electronically Signed   By: Rubye Oaks M.D.   On: 06/25/2015 02:12   I have personally reviewed and evaluated these images and lab results as part of my medical decision-making.   EKG Interpretation   Date/Time:  Sunday June 25 2015 01:54:35 EDT Ventricular Rate:  82 PR Interval:  202 QRS Duration: 94 QT Interval:  378 QTC Calculation: 441 R  Axis:   -3 Text Interpretation:  Sinus rhythm Borderline prolonged PR interval  Probable left atrial enlargement Abnormal R-wave progression, early  transition Borderline T abnormalities, diffuse leads Confirmed by Junior Kenedy   MD, Jevante Hollibaugh ([id number]) on 06/25/2015 3:18:24 AM      MDM   Final diagnoses:  None    Patient presents with complaints of headache and stating that his blood pressure is high. He ran out of his blood pressure medication several days ago and cannot afford to have it refilled. He arrived here very dramatic and tearful, but with a normal neurologic exam area his blood pressures have been in the 160s over 100s and have been improving after receiving the medications in the ER that he normally takes at home. I see no indication for hospitalization or further workup.  I personally performed the services described in this documentation, which was scribed in my presence. The recorded information has been reviewed and is accurate.       Geoffery Lyons, MD 06/25/15 [phone]

## 2015-06-25 NOTE — Discharge Instructions (Signed)
Continue taking your blood pressure medications as prescribed. I have refilled these for you here today. Please see your primary doctor for future refills.   Hypertension Hypertension, commonly called high blood pressure, is when the force of blood pumping through your arteries is too strong. Your arteries are the blood vessels that carry blood from your heart throughout your body. A blood pressure reading consists of a higher number over a lower number, such as 110/72. The higher number (systolic) is the pressure inside your arteries when your heart pumps. The lower number (diastolic) is the pressure inside your arteries when your heart relaxes. Ideally you want your blood pressure below 120/80. Hypertension forces your heart to work harder to pump blood. Your arteries may become narrow or stiff. Having untreated or uncontrolled hypertension can cause heart attack, stroke, kidney disease, and other problems. RISK FACTORS Some risk factors for high blood pressure are controllable. Others are not.  Risk factors you cannot control include:   Race. You may be at higher risk if you are African American.  Age. Risk increases with age.  Gender. Men are at higher risk than women before age 13 years. After age 9, women are at higher risk than men. Risk factors you can control include:  Not getting enough exercise or physical activity.  Being overweight.  Getting too much fat, sugar, calories, or salt in your diet.  Drinking too much alcohol. SIGNS AND SYMPTOMS Hypertension does not usually cause signs or symptoms. Extremely high blood pressure (hypertensive crisis) may cause headache, anxiety, shortness of breath, and nosebleed. DIAGNOSIS To check if you have hypertension, your health care provider will measure your blood pressure while you are seated, with your arm held at the level of your heart. It should be measured at least twice using the same arm. Certain conditions can cause a difference  in blood pressure between your right and left arms. A blood pressure reading that is higher than normal on one occasion does not mean that you need treatment. If it is not clear whether you have high blood pressure, you may be asked to return on a different day to have your blood pressure checked again. Or, you may be asked to monitor your blood pressure at home for 1 or more weeks. TREATMENT Treating high blood pressure includes making lifestyle changes and possibly taking medicine. Living a healthy lifestyle can help lower high blood pressure. You may need to change some of your habits. Lifestyle changes may include:  Following the DASH diet. This diet is high in fruits, vegetables, and whole grains. It is low in salt, red meat, and added sugars.  Keep your sodium intake below 2,300 mg per day.  Getting at least 30-45 minutes of aerobic exercise at least 4 times per week.  Losing weight if necessary.  Not smoking.  Limiting alcoholic beverages.  Learning ways to reduce stress. Your health care provider may prescribe medicine if lifestyle changes are not enough to get your blood pressure under control, and if one of the following is true:  You are 29-39 years of age and your systolic blood pressure is above 140.  You are 30 years of age or older, and your systolic blood pressure is above 150.  Your diastolic blood pressure is above 90.  You have diabetes, and your systolic blood pressure is over 140 or your diastolic blood pressure is over 90.  You have kidney disease and your blood pressure is above 140/90.  You have heart disease and  your blood pressure is above 140/90. Your personal target blood pressure may vary depending on your medical conditions, your age, and other factors. HOME CARE INSTRUCTIONS  Have your blood pressure rechecked as directed by your health care provider.   Take medicines only as directed by your health care provider. Follow the directions carefully.  Blood pressure medicines must be taken as prescribed. The medicine does not work as well when you skip doses. Skipping doses also puts you at risk for problems.  Do not smoke.   Monitor your blood pressure at home as directed by your health care provider. SEEK MEDICAL CARE IF:   You think you are having a reaction to medicines taken.  You have recurrent headaches or feel dizzy.  You have swelling in your ankles.  You have trouble with your vision. SEEK IMMEDIATE MEDICAL CARE IF:  You develop a severe headache or confusion.  You have unusual weakness, numbness, or feel faint.  You have severe chest or abdominal pain.  You vomit repeatedly.  You have trouble breathing. MAKE SURE YOU:   Understand these instructions.  Will watch your condition.  Will get help right away if you are not doing well or get worse.   This information is not intended to replace advice given to you by your health care provider. Make sure you discuss any questions you have with your health care provider.   Document Released: 02/18/2005 Document Revised: 07/05/2014 Document Reviewed: 12/11/2012 Elsevier Interactive Patient Education [employer] Elsevier Inc.

## 2015-06-25 NOTE — ED Notes (Signed)
In CT

## 2015-07-14 ENCOUNTER — Emergency Department (HOSPITAL_COMMUNITY)
Admission: EM | Admit: 2015-07-14 | Discharge: 2015-07-14 | Disposition: A | Discharge status: Home / Self Care | Payer: No Typology Code available for payment source | Attending: Emergency Medicine | Admitting: Emergency Medicine

## 2015-07-14 ENCOUNTER — Encounter (HOSPITAL_COMMUNITY): Payer: Self-pay

## 2015-07-14 DIAGNOSIS — F329 Major depressive disorder, single episode, unspecified: Secondary | ICD-10-CM | POA: Insufficient documentation

## 2015-07-14 DIAGNOSIS — F419 Anxiety disorder, unspecified: Secondary | ICD-10-CM | POA: Insufficient documentation

## 2015-07-14 DIAGNOSIS — Z79899 Other long term (current) drug therapy: Secondary | ICD-10-CM | POA: Insufficient documentation

## 2015-07-14 DIAGNOSIS — Z880 Allergy status to penicillin: Secondary | ICD-10-CM | POA: Insufficient documentation

## 2015-07-14 DIAGNOSIS — R61 Generalized hyperhidrosis: Secondary | ICD-10-CM | POA: Insufficient documentation

## 2015-07-14 DIAGNOSIS — E669 Obesity, unspecified: Secondary | ICD-10-CM | POA: Insufficient documentation

## 2015-07-14 DIAGNOSIS — Z760 Encounter for issue of repeat prescription: Secondary | ICD-10-CM | POA: Insufficient documentation

## 2015-07-14 DIAGNOSIS — Z87891 Personal history of nicotine dependence: Secondary | ICD-10-CM | POA: Insufficient documentation

## 2015-07-14 DIAGNOSIS — I10 Essential (primary) hypertension: Secondary | ICD-10-CM | POA: Insufficient documentation

## 2015-07-14 MED ORDER — CLONIDINE HCL 0.2 MG PO TABS
0.3000 mg | ORAL_TABLET | Freq: Once | ORAL | Status: AC
  Administered 2015-07-14: 0.3 mg via ORAL
  Filled 2015-07-14: qty 1

## 2015-07-14 MED ORDER — TRAMADOL HCL 50 MG PO TABS
50.0000 mg | ORAL_TABLET | Freq: Once | ORAL | Status: AC
Start: 2015-07-14 — End: 2015-07-14
  Administered 2015-07-14: 50 mg via ORAL
  Filled 2015-07-14: qty 1

## 2015-07-14 MED ORDER — CLONIDINE HCL 0.3 MG PO TABS: 0.3000 mg | ORAL_TABLET | Freq: Two times a day (BID) | ORAL | Status: DC

## 2015-07-14 NOTE — ED Notes (Signed)
Pt here reporting he is out of your clonidine, valium, and tramadol. He has a hx of PTSD. Pt reports hehas been out of these meds X2 days. He reports systolic BP of 200 prior to arrival.

## 2015-07-14 NOTE — Discharge Instructions (Signed)
Read the information below.  Use the prescribed medication as directed.  Please discuss all new medications with your pharmacist.  Be sure to follow up with your PCP for further management of your high blood pressure. Return to the ED if you develop new or worsening symptoms, chest pain, shortness of breath, difficulty breathing, new neurologic symptoms such as weakness, numbness, changes in vision, or loss of consciousness.   Hypertension Hypertension is another name for high blood pressure. High blood pressure forces your heart to work harder to pump blood. A blood pressure reading has two numbers, which includes a higher number over a lower number (example: 110/72). HOME CARE   Have your blood pressure rechecked by your doctor.  Only take medicine as told by your doctor. Follow the directions carefully. The medicine does not work as well if you skip doses. Skipping doses also puts you at risk for problems.  Do not smoke.  Monitor your blood pressure at home as told by your doctor. GET HELP IF:  You think you are having a reaction to the medicine you are taking.  You have repeat headaches or feel dizzy.  You have puffiness (swelling) in your ankles.  You have trouble with your vision. GET HELP RIGHT AWAY IF:   You get a very bad headache and are confused.  You feel weak, numb, or faint.  You get chest or belly (abdominal) pain.  You throw up (vomit).  You cannot breathe very well. MAKE SURE YOU:   Understand these instructions.  Will watch your condition.  Will get help right away if you are not doing well or get worse.   This information is not intended to replace advice given to you by your health care provider. Make sure you discuss any questions you have with your health care provider.   Document Released: 08/07/2007 Document Revised: 02/23/2013 Document Reviewed: 12/11/2012 Elsevier Interactive Patient Education [employer]2016 Elsevier Inc.

## 2015-07-14 NOTE — ED Provider Notes (Signed)
CSN: [id number]     Arrival date & time 07/14/15  8295 History   First MD Initiated Contact with Patient 07/14/15 [phone]     Chief Complaint  Patient presents with  . Hypertension  . Medication Refill     (Consider location/radiation/quality/duration/timing/severity/associated sxs/prior Treatment) HPI Comments: Erion Weightman is a 50 y.o. male with history of hypertension reports to ED for hypertension. Patient states he is on clonidine, metoprolol, and lisinopril for hypertension. Patient is out of his Clonidine and has not taken Clonidine for two days. Reports rebound hypertension with associated headache. Headache is his "typical" when he misses his Clonidine dose. Headache was gradual in onset and aching in nature. No changes in vision. No new neurologic symptoms. No chest pain or shortness of breath. No neck pain. No fevers, chills, or night sweats. Patient also reports he is out of his valium and tramadol as well. Patient requesting Clonidine prescription to get him to his PCP appointment in two weeks.   Patient is a 50 y.o. male presenting with hypertension. The history is provided by the patient.  Hypertension This is a chronic problem. Associated symptoms include diaphoresis and headaches ( typical for when not on clonidine. Gradual in onset, aching in nature. ). Pertinent negatives include no abdominal pain, chest pain, chills, fatigue, fever, nausea, neck pain, numbness, rash, sore throat, vomiting or weakness.    Past Medical History  Diagnosis Date  . Hypertension   . Tachycardia - pulse   . Degenerative disk disease   . Spinal stenosis   . Degenerative disk disease   . Tobacco abuse   . Obesity   . Polysubstance abuse   . Chest pain     Hospital, March, 2014, negative enzymes, patient refused in-hospital  stress test, patient canceled outpatient stress test  . Spinal stenosis   . Depression   . Neuromuscular disorder (HCC)   . Anxiety     Panic attacks  . Shortness of  breath dyspnea     with exertion  . PTSD (post-traumatic stress disorder)   . Incontinence of urine   . GERD (gastroesophageal reflux disease)   . Constipation    Past Surgical History  Procedure Laterality Date  . Wisdom tooth extraction    . Esophagogastroduodenoscopy (egd) with propofol N/A 08/24/2014    Procedure: ESOPHAGOGASTRODUODENOSCOPY (EGD) WITH PROPOFOL;  Surgeon: Charlott Rakes, MD;  Location: [facility] ENDOSCOPY;  Service: Endoscopy;  Laterality: N/A;  . Colonoscopy N/A 08/24/2014    Procedure: COLONOSCOPY;  Surgeon: Charlott Rakes, MD;  Location: [facility] ENDOSCOPY;  Service: Endoscopy;  Laterality: N/A;   Family History  Problem Relation Age of Onset  . Stroke Mother   . Hypertension Mother   . Hyperlipidemia Mother   . Stroke Father   . Hypertension Father   . Dementia Father   . Diabetes Father   . Heart disease Father   . Hyperlipidemia Father   . Cancer Sister     brain  . Diabetes Sister   . Heart disease Sister   . Hyperlipidemia Sister   . Hypertension Sister   . Stroke Sister   . Heart disease Brother   . Hyperlipidemia Brother    Social History  Substance Use Topics  . Smoking status: Former Smoker -- 0.02 packs/day for 27 years    Types: Cigarettes    Quit date: 04/05/2015  . Smokeless tobacco: Never Used     Comment: "quit April 2016, I havwe one every now and then"  . Alcohol Use: No  Review of Systems  Constitutional: Positive for diaphoresis. Negative for fever, chills and fatigue.  HENT: Negative for sore throat and trouble swallowing.   Eyes: Negative for visual disturbance.  Respiratory: Negative for shortness of breath.   Cardiovascular: Negative for chest pain, palpitations and leg swelling.  Gastrointestinal: Negative for nausea, vomiting, abdominal pain, diarrhea, constipation and blood in stool.  Genitourinary: Negative for dysuria, frequency and hematuria.  Musculoskeletal: Negative for neck pain and neck stiffness.  Skin: Negative  for rash.  Neurological: Positive for headaches ( typical for when not on clonidine. Gradual in onset, aching in nature. ). Negative for dizziness, weakness, light-headedness and numbness.      Allergies  Blueberry flavor; Penicillins; Robaxin; Flexeril; and Toradol  Home Medications   Prior to Admission medications   Medication Sig Start Date End Date Taking? Authorizing Provider  baclofen (LIORESAL) 10 MG tablet Take 10 mg by mouth 3 (three) times daily.    Historical Provider, MD  cloNIDine (CATAPRES) 0.3 MG tablet Take 1 tablet (0.3 mg total) by mouth 2 (two) times daily. 06/25/15   Geoffery Lyons, MD  doxycycline (VIBRAMYCIN) 100 MG capsule Take 1 capsule (100 mg total) by mouth 2 (two) times daily. Patient not taking: Reported on 06/25/2015 05/13/15   Mady Gemma, PA-C  HYDROcodone-acetaminophen (NORCO/VICODIN) 5-325 MG tablet Take 1 tablet by mouth every 4 (four) hours as needed. Patient not taking: Reported on 06/25/2015 05/13/15   Mady Gemma, PA-C  ibuprofen (ADVIL,MOTRIN) 800 MG tablet Take 1 tablet (800 mg total) by mouth 3 (three) times daily. Patient not taking: Reported on 06/25/2015 03/18/15   Danelle Berry, PA-C  lisinopril (PRINIVIL,ZESTRIL) 20 MG tablet Take 1 tablet (20 mg total) by mouth daily. 06/25/15   Geoffery Lyons, MD  metoprolol tartrate (LOPRESSOR) 25 MG tablet Take 1 tablet (25 mg total) by mouth 2 (two) times daily. 06/25/15   Geoffery Lyons, MD  ranitidine (ZANTAC) 150 MG tablet Take 1 tablet (150 mg total) by mouth daily as needed for heartburn. Patient not taking: Reported on 06/25/2015 02/11/15   Dione Booze, MD  traMADol (ULTRAM) 50 MG tablet Take 1 tablet (50 mg total) by mouth every 6 (six) hours as needed. 05/26/15   Shawn C Joy, PA-C  traMADol (ULTRAM) 50 MG tablet Take 1-2 tablets by mouth every six hours as needed for pain.  Do not use >8 tabs/day. Patient not taking: Reported on 06/25/2015 06/18/15   Danelle Berry, PA-C   BP 150/106 mmHg  Pulse 77   Temp(Src) 97.7 F (36.5 C) (Oral)  Resp 18  Ht 5\' 11"  (1.803 m)  Wt 130.182 kg  BMI 40.05 kg/m2  SpO2 99% Physical Exam  Constitutional: He appears well-developed and well-nourished. No distress.  HENT:  Head: Normocephalic and atraumatic.  Mouth/Throat: Oropharynx is clear and moist. No oropharyngeal exudate.  Eyes: Conjunctivae and EOM are normal. Pupils are equal, round, and reactive to light. Right eye exhibits no discharge. Left eye exhibits no discharge. No scleral icterus.  Neck: Normal range of motion. Neck supple.  Cardiovascular: Normal rate, regular rhythm, normal heart sounds and intact distal pulses.   No murmur heard. Pulmonary/Chest: Effort normal and breath sounds normal. No respiratory distress. He has no wheezes.  Abdominal: Soft. Bowel sounds are normal. There is no tenderness.  Musculoskeletal: Normal range of motion.  Lymphadenopathy:    He has no cervical adenopathy.  Neurological: He is alert. No cranial nerve deficit. Coordination and gait normal.  Patient is ambulatory with assistance of  cane. CN II-XII grossly intact. No facial droop or slurred speech. Decreased sensation in left upper extremity - chronic in nature and unchanged due to nerve damage. Decreased left lower extremity strength - chronic in nature and unchanged. Strength and sensation otherwise intact.   Skin: Skin is warm and dry. He is not diaphoretic.  Psychiatric: He has a normal mood and affect.    ED Course  Procedures (including critical care time) Labs Review Labs Reviewed - No data to display  Imaging Review No results found. I have personally reviewed and evaluated these images and lab results as part of my medical decision-making.   EKG Interpretation None      MDM   Final diagnoses:  Essential hypertension  Medication management    Irene LimboJames Berghuis is a 50 y.o. male with history of hypertension reports to ED for hypertension. He has not taken Clonidine for two days. No new  neurologic symptoms. No chest pain or shortness of breath. Endorses headache typical for when he misses his clonidine. Headache was gradual in onset. Patient is afebrile and non-toxic. Blood pressure mildly elevated. Patient is ambulatory. Normal cardiac and pulmonary exam. Chronic decreased sensation in RUE, chronic decrease strength in RLE. Neurologic exam otherwise unremarkable - doubt stroke or SAH. Provided patient home dose of Clonidine in ED.   10:55 AM: Patient endorses improvement in headache. No new neurologic symptoms.   11:45 AM: Blood pressure improved 139/92. Patient continues to endorse improvement in headache 5/10. No new neurologic symptoms. No chest pain or shortness of breath. Will give one dose of tramadol prior to discharge (he is out of his tramadol or valium either).   Will provide prescription for clonidine. Encouraged follow up with PCP for further refills. Discussed return precautions. Patient voiced understanding and is agreeable.   Lona KettleAshley Laurel Arbadella Kimbler, [place] JerseyPA-C 07/14/15 1634  Zadie Rhineonald Wickline, MD 07/17/15 [phone]

## 2015-07-30 ENCOUNTER — Emergency Department (HOSPITAL_COMMUNITY)
Admission: EM | Admit: 2015-07-30 | Discharge: 2015-07-30 | Disposition: A | Discharge status: Home / Self Care | Payer: No Typology Code available for payment source | Attending: Emergency Medicine | Admitting: Emergency Medicine

## 2015-07-30 ENCOUNTER — Encounter (HOSPITAL_COMMUNITY): Payer: Self-pay | Admitting: Emergency Medicine

## 2015-07-30 DIAGNOSIS — Z87891 Personal history of nicotine dependence: Secondary | ICD-10-CM | POA: Insufficient documentation

## 2015-07-30 DIAGNOSIS — R51 Headache: Secondary | ICD-10-CM

## 2015-07-30 DIAGNOSIS — Z79899 Other long term (current) drug therapy: Secondary | ICD-10-CM | POA: Insufficient documentation

## 2015-07-30 DIAGNOSIS — R519 Headache, unspecified: Secondary | ICD-10-CM

## 2015-07-30 DIAGNOSIS — Z791 Long term (current) use of non-steroidal anti-inflammatories (NSAID): Secondary | ICD-10-CM | POA: Insufficient documentation

## 2015-07-30 DIAGNOSIS — Z760 Encounter for issue of repeat prescription: Secondary | ICD-10-CM | POA: Insufficient documentation

## 2015-07-30 DIAGNOSIS — I10 Essential (primary) hypertension: Secondary | ICD-10-CM | POA: Insufficient documentation

## 2015-07-30 MED ORDER — TRAMADOL HCL 50 MG PO TABS
50.0000 mg | ORAL_TABLET | Freq: Once | ORAL | Status: AC
  Administered 2015-07-30: 50 mg via ORAL
  Filled 2015-07-30: qty 1

## 2015-07-30 MED ORDER — CLONIDINE HCL 0.3 MG PO TABS: 0.3000 mg | ORAL_TABLET | Freq: Two times a day (BID) | ORAL | Status: DC

## 2015-07-30 MED ORDER — CLONIDINE HCL 0.1 MG PO TABS
0.3000 mg | ORAL_TABLET | Freq: Once | ORAL | Status: AC
  Administered 2015-07-30: 0.3 mg via ORAL
  Filled 2015-07-30: qty 3

## 2015-07-30 MED ORDER — METOPROLOL TARTRATE 25 MG PO TABS: 25.0000 mg | ORAL_TABLET | Freq: Two times a day (BID) | ORAL | Status: DC

## 2015-07-30 MED ORDER — LISINOPRIL 20 MG PO TABS
20.0000 mg | ORAL_TABLET | Freq: Every day | ORAL | Status: DC
Start: 2015-07-30 — End: 2015-08-19

## 2015-07-30 NOTE — Discharge Instructions (Signed)
Please read and follow all provided instructions.  Your diagnoses today include:  1. Acute nonintractable headache, unspecified headache type   2. Essential hypertension   3. Medication refill     Tests performed today include:  Vital signs. See below for your results today.   Medications:  Blood pressure medication refills  Take any prescribed medications only as directed.  Additional information:  Follow any educational materials contained in this packet.  You are having a headache. No specific cause was found today for your headache. It may have been a migraine or other cause of headache. Stress, anxiety, fatigue, and depression are common triggers for headaches.   Your headache today does not appear to be life-threatening or require hospitalization, but often the exact cause of headaches is not determined in the emergency department. Therefore, follow-up with your doctor is very important to find out what may have caused your headache and whether or not you need any further diagnostic testing or treatment.   Sometimes headaches can appear benign (not harmful), but then more serious symptoms can develop which should prompt an immediate re-evaluation by your doctor or the emergency department.  BE VERY CAREFUL not to take multiple medicines containing Tylenol (also called acetaminophen). Doing so can lead to an overdose which can damage your liver and cause liver failure and possibly death.   Follow-up instructions: Please follow-up with your primary care provider in the next 3 days for further evaluation of your symptoms.   Return instructions:   Please return to the Emergency Department if you experience worsening symptoms.  Return if the medications do not resolve your headache, if it recurs, or if you have multiple episodes of vomiting or cannot keep down fluids.  Return if you have a change from the usual headache.  RETURN IMMEDIATELY IF you:  Develop a sudden, severe  headache  Develop confusion or become poorly responsive or faint  Develop a fever above 100.14F or problem breathing  Have a change in speech, vision, swallowing, or understanding  Develop new weakness, numbness, tingling, incoordination in your arms or legs  Have a seizure  Please return if you have any other emergent concerns.  Additional Information:  Your vital signs today were: BP 154/117 mmHg   Pulse 111   Temp(Src) 97.8 F (36.6 C) (Oral)   Resp 18   SpO2 97% If your blood pressure (BP) was elevated above 135/85 this visit, please have this repeated by your doctor within one month. --------------

## 2015-07-30 NOTE — ED Notes (Signed)
Pt c/o hypertension and headache. Pt hasn't taken any bp meds x 2 days.
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
## 2015-08-14 ENCOUNTER — Emergency Department (HOSPITAL_COMMUNITY)
Admission: EM | Admit: 2015-08-14 | Discharge: 2015-08-14 | Disposition: A | Discharge status: Home / Self Care | Payer: No Typology Code available for payment source | Attending: Dermatology | Admitting: Dermatology

## 2015-08-14 ENCOUNTER — Encounter (HOSPITAL_COMMUNITY): Payer: Self-pay | Admitting: Emergency Medicine

## 2015-08-14 DIAGNOSIS — R52 Pain, unspecified: Secondary | ICD-10-CM | POA: Insufficient documentation

## 2015-08-14 DIAGNOSIS — F329 Major depressive disorder, single episode, unspecified: Secondary | ICD-10-CM | POA: Insufficient documentation

## 2015-08-14 DIAGNOSIS — I10 Essential (primary) hypertension: Secondary | ICD-10-CM | POA: Insufficient documentation

## 2015-08-14 DIAGNOSIS — Z5321 Procedure and treatment not carried out due to patient leaving prior to being seen by health care provider: Secondary | ICD-10-CM | POA: Insufficient documentation

## 2015-08-14 DIAGNOSIS — R6883 Chills (without fever): Secondary | ICD-10-CM | POA: Insufficient documentation

## 2015-08-14 DIAGNOSIS — Z87891 Personal history of nicotine dependence: Secondary | ICD-10-CM | POA: Insufficient documentation

## 2015-08-14 DIAGNOSIS — Z79899 Other long term (current) drug therapy: Secondary | ICD-10-CM | POA: Insufficient documentation

## 2015-08-14 NOTE — ED Notes (Signed)
Per EMS pt is c/o body aches and chills  Pt states it started about an hour ago  Pt states he may have had a syncopal episode  Pt states he woke up in a chair and does not remember how he got there

## 2015-08-19 ENCOUNTER — Encounter (HOSPITAL_COMMUNITY): Payer: Self-pay | Admitting: Emergency Medicine

## 2015-08-19 ENCOUNTER — Emergency Department (HOSPITAL_COMMUNITY)
Admission: EM | Admit: 2015-08-19 | Discharge: 2015-08-19 | Disposition: A | Discharge status: Home / Self Care | Payer: No Typology Code available for payment source | Attending: Emergency Medicine | Admitting: Emergency Medicine

## 2015-08-19 DIAGNOSIS — G894 Chronic pain syndrome: Secondary | ICD-10-CM

## 2015-08-19 DIAGNOSIS — Z760 Encounter for issue of repeat prescription: Secondary | ICD-10-CM | POA: Insufficient documentation

## 2015-08-19 DIAGNOSIS — Z79899 Other long term (current) drug therapy: Secondary | ICD-10-CM | POA: Insufficient documentation

## 2015-08-19 DIAGNOSIS — I10 Essential (primary) hypertension: Secondary | ICD-10-CM

## 2015-08-19 DIAGNOSIS — Z87891 Personal history of nicotine dependence: Secondary | ICD-10-CM | POA: Insufficient documentation

## 2015-08-19 MED ORDER — TRAMADOL HCL 50 MG PO TABS: ORAL_TABLET | ORAL | Status: DC

## 2015-08-19 MED ORDER — LISINOPRIL 20 MG PO TABS
20.0000 mg | ORAL_TABLET | Freq: Every day | ORAL | Status: DC
Start: 2015-08-19 — End: 2015-09-06

## 2015-08-19 MED ORDER — CLONIDINE HCL 0.3 MG PO TABS: 0.3000 mg | ORAL_TABLET | Freq: Two times a day (BID) | ORAL | Status: DC

## 2015-08-19 NOTE — ED Notes (Signed)
Patient reports being out of hypertension meds (Clonidine and Lisinopril) x1 1/2 days.

## 2015-08-19 NOTE — Discharge Instructions (Signed)
Mr. Travis [name],  Nice meeting you! Please follow-up with [facility]. Return to the emergency department if you develop chest pain, shortness of breath, new/worsening symptoms. Feel better soon!  S. Lane HackerNicole Rashan Rounsaville, PA-C
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
## 2015-08-25 ENCOUNTER — Emergency Department (HOSPITAL_COMMUNITY)
Admission: EM | Admit: 2015-08-25 | Discharge: 2015-08-25 | Disposition: A | Discharge status: Home / Self Care | Payer: No Typology Code available for payment source | Attending: Emergency Medicine | Admitting: Emergency Medicine

## 2015-08-25 ENCOUNTER — Encounter (HOSPITAL_COMMUNITY): Payer: Self-pay | Admitting: Emergency Medicine

## 2015-08-25 DIAGNOSIS — F329 Major depressive disorder, single episode, unspecified: Secondary | ICD-10-CM | POA: Insufficient documentation

## 2015-08-25 DIAGNOSIS — Z7951 Long term (current) use of inhaled steroids: Secondary | ICD-10-CM | POA: Insufficient documentation

## 2015-08-25 DIAGNOSIS — Z79899 Other long term (current) drug therapy: Secondary | ICD-10-CM | POA: Insufficient documentation

## 2015-08-25 DIAGNOSIS — I10 Essential (primary) hypertension: Secondary | ICD-10-CM

## 2015-08-25 DIAGNOSIS — Z87891 Personal history of nicotine dependence: Secondary | ICD-10-CM | POA: Insufficient documentation

## 2015-08-25 MED ORDER — CLONIDINE HCL 0.3 MG PO TABS: 0.3000 mg | ORAL_TABLET | Freq: Two times a day (BID) | ORAL | Status: DC

## 2015-08-25 MED ORDER — TRAMADOL HCL 50 MG PO TABS
100.0000 mg | ORAL_TABLET | Freq: Once | ORAL | Status: AC
  Administered 2015-08-25: 100 mg via ORAL
  Filled 2015-08-25: qty 2

## 2015-08-25 MED ORDER — CLONIDINE HCL 0.1 MG PO TABS
0.3000 mg | ORAL_TABLET | Freq: Once | ORAL | Status: AC
  Administered 2015-08-25: 0.3 mg via ORAL
  Filled 2015-08-25: qty 3

## 2015-08-25 MED ORDER — CYCLOBENZAPRINE HCL 10 MG PO TABS
10.0000 mg | ORAL_TABLET | Freq: Once | ORAL | Status: AC
  Administered 2015-08-25: 10 mg via ORAL
  Filled 2015-08-25: qty 1
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
## 2015-08-25 NOTE — ED Notes (Signed)
Discharge instructions, follow up care, and rx x1 reviewed with patient. Patient verbalized understanding. 
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
## 2015-08-25 NOTE — ED Notes (Signed)
PA at bedside.

## 2015-08-25 NOTE — Discharge Instructions (Signed)
Continue your blood pressure medications. Follow up with your doctor.    Hypertension Hypertension is another name for high blood pressure. High blood pressure forces your heart to work harder to pump blood. A blood pressure reading has two numbers, which includes a higher number over a lower number (example: 110/72). HOME CARE   Have your blood pressure rechecked by your doctor.  Only take medicine as told by your doctor. Follow the directions carefully. The medicine does not work as well if you skip doses. Skipping doses also puts you at risk for problems.  Do not smoke.  Monitor your blood pressure at home as told by your doctor. GET HELP IF:  You think you are having a reaction to the medicine you are taking.  You have repeat headaches or feel dizzy.  You have puffiness (swelling) in your ankles.  You have trouble with your vision. GET HELP RIGHT AWAY IF:   You get a very bad headache and are confused.  You feel weak, numb, or faint.  You get chest or belly (abdominal) pain.  You throw up (vomit).  You cannot breathe very well. MAKE SURE YOU:   Understand these instructions.  Will watch your condition.  Will get help right away if you are not doing well or get worse.   This information is not intended to replace advice given to you by your health care provider. Make sure you discuss any questions you have with your health care provider.   Document Released: 08/07/2007 Document Revised: 02/23/2013 Document Reviewed: 12/11/2012 Elsevier Interactive Patient Education [employer]2016 Elsevier Inc.

## 2015-08-31 ENCOUNTER — Emergency Department (HOSPITAL_COMMUNITY): Payer: Self-pay

## 2015-08-31 ENCOUNTER — Encounter (HOSPITAL_COMMUNITY): Payer: Self-pay | Admitting: Emergency Medicine

## 2015-08-31 ENCOUNTER — Inpatient Hospital Stay (HOSPITAL_COMMUNITY)
Admission: EM | Admit: 2015-08-31 | Discharge: 2015-09-01 | DRG: 682 | Discharge status: Against Medical Advice | Payer: Self-pay | Attending: Internal Medicine | Admitting: Internal Medicine

## 2015-08-31 DIAGNOSIS — N19 Unspecified kidney failure: Secondary | ICD-10-CM

## 2015-08-31 DIAGNOSIS — M4806 Spinal stenosis, lumbar region: Secondary | ICD-10-CM | POA: Diagnosis present

## 2015-08-31 DIAGNOSIS — F329 Major depressive disorder, single episode, unspecified: Secondary | ICD-10-CM | POA: Diagnosis present

## 2015-08-31 DIAGNOSIS — D649 Anemia, unspecified: Secondary | ICD-10-CM | POA: Diagnosis present

## 2015-08-31 DIAGNOSIS — G9340 Encephalopathy, unspecified: Secondary | ICD-10-CM | POA: Diagnosis present

## 2015-08-31 DIAGNOSIS — R531 Weakness: Secondary | ICD-10-CM

## 2015-08-31 DIAGNOSIS — I959 Hypotension, unspecified: Secondary | ICD-10-CM | POA: Diagnosis present

## 2015-08-31 DIAGNOSIS — M4800 Spinal stenosis, site unspecified: Secondary | ICD-10-CM | POA: Diagnosis present

## 2015-08-31 DIAGNOSIS — I10 Essential (primary) hypertension: Secondary | ICD-10-CM | POA: Diagnosis present

## 2015-08-31 DIAGNOSIS — K219 Gastro-esophageal reflux disease without esophagitis: Secondary | ICD-10-CM | POA: Diagnosis present

## 2015-08-31 DIAGNOSIS — F4310 Post-traumatic stress disorder, unspecified: Secondary | ICD-10-CM | POA: Diagnosis present

## 2015-08-31 DIAGNOSIS — Z79899 Other long term (current) drug therapy: Secondary | ICD-10-CM

## 2015-08-31 DIAGNOSIS — N179 Acute kidney failure, unspecified: Principal | ICD-10-CM

## 2015-08-31 DIAGNOSIS — G8929 Other chronic pain: Secondary | ICD-10-CM | POA: Diagnosis present

## 2015-08-31 DIAGNOSIS — Z87891 Personal history of nicotine dependence: Secondary | ICD-10-CM

## 2015-08-31 LAB — COMPREHENSIVE METABOLIC PANEL
ALT: 32 U/L (ref 17–63)
ANION GAP: 9 (ref 5–15)
AST: 48 U/L — ABNORMAL HIGH (ref 15–41)
Albumin: 3.2 g/dL — ABNORMAL LOW (ref 3.5–5.0)
Alkaline Phosphatase: 81 U/L (ref 38–126)
BILIRUBIN TOTAL: 0.5 mg/dL (ref 0.3–1.2)
BUN: 27 mg/dL — ABNORMAL HIGH (ref 6–20)
CO2: 22 mmol/L (ref 22–32)
Calcium: 7.9 mg/dL — ABNORMAL LOW (ref 8.9–10.3)
Chloride: 102 mmol/L (ref 101–111)
Creatinine, Ser: 5.77 mg/dL — ABNORMAL HIGH (ref 0.61–1.24)
GFR calc Af Amer: 12 mL/min — ABNORMAL LOW (ref >60)
GFR, EST NON AFRICAN AMERICAN: 10 mL/min — AB (ref >60)
Glucose, Bld: 169 mg/dL — ABNORMAL HIGH (ref 65–99)
POTASSIUM: 4 mmol/L (ref 3.5–5.1)
Sodium: 133 mmol/L — ABNORMAL LOW (ref 135–145)
TOTAL PROTEIN: 6.4 g/dL — AB (ref 6.5–8.1)

## 2015-08-31 LAB — CBC WITH DIFFERENTIAL/PLATELET
BASOS PCT: 1 %
Basophils Absolute: 0.1 10*3/uL (ref 0.0–0.1)
Eosinophils Absolute: 0.3 10*3/uL (ref 0.0–0.7)
Eosinophils Relative: 3 %
HEMATOCRIT: 39 % (ref 39.0–52.0)
Hemoglobin: 12.4 g/dL — ABNORMAL LOW (ref 13.0–17.0)
LYMPHS PCT: 40 %
Lymphs Abs: 4.3 10*3/uL — ABNORMAL HIGH (ref 0.7–4.0)
MCH: 28.4 pg (ref 26.0–34.0)
MCHC: 31.8 g/dL (ref 30.0–36.0)
MCV: 89.4 fL (ref 78.0–100.0)
MONO ABS: 0.8 10*3/uL (ref 0.1–1.0)
MONOS PCT: 8 %
NEUTROS ABS: 5.2 10*3/uL (ref 1.7–7.7)
Neutrophils Relative %: 48 %
Platelets: 158 10*3/uL (ref 150–400)
RBC: 4.36 MIL/uL (ref 4.22–5.81)
RDW: 14 % (ref 11.5–15.5)
WBC: 10.4 10*3/uL (ref 4.0–10.5)

## 2015-08-31 LAB — CBG MONITORING, ED: Glucose-Capillary: 168 mg/dL — ABNORMAL HIGH (ref 65–99)

## 2015-08-31 LAB — URINE MICROSCOPIC-ADD ON

## 2015-08-31 LAB — URINALYSIS, ROUTINE W REFLEX MICROSCOPIC
GLUCOSE, UA: NEGATIVE mg/dL
Ketones, ur: 15 mg/dL — AB
Leukocytes, UA: NEGATIVE
Nitrite: NEGATIVE
PH: 5 (ref 5.0–8.0)
Protein, ur: 30 mg/dL — AB
SPECIFIC GRAVITY, URINE: 1.028 (ref 1.005–1.030)

## 2015-08-31 LAB — I-STAT TROPONIN, ED: Troponin i, poc: 0.02 ng/mL (ref 0.00–0.08)

## 2015-08-31 LAB — LACTIC ACID, PLASMA: LACTIC ACID, VENOUS: 1.2 mmol/L (ref 0.5–1.9)

## 2015-08-31 LAB — I-STAT CG4 LACTIC ACID, ED: Lactic Acid, Venous: 1.15 mmol/L (ref 0.5–1.9)

## 2015-08-31 MED ORDER — SODIUM CHLORIDE 0.9 % IV BOLUS (SEPSIS)
1000.0000 mL | Freq: Once | INTRAVENOUS | Status: AC
  Administered 2015-08-31: 1000 mL via INTRAVENOUS

## 2015-08-31 NOTE — ED Provider Notes (Signed)
Medical screening examination/treatment/procedure(s) were conducted as a shared visit with non-physician practitioner(s) and myself.  I personally evaluated the patient during the encounter.   EKG Interpretation   Date/Time:  Thursday August 31 2015 18:52:03 EDT Ventricular Rate:  77 PR Interval:    QRS Duration: 97 QT Interval:  401 QTC Calculation: 454 R Axis:   -8 Text Interpretation:  Sinus rhythm Left ventricular hypertrophy Confirmed  by Deretha EmoryZACKOWSKI  MD, [name] Carcamo [phone]([id number]) on 08/31/2015 7:30:49 PM      Results for orders placed or performed during the hospital encounter of 08/31/15  CBC with Differential  Result Value Ref Range   WBC 10.4 4.0 - 10.5 K/uL   RBC 4.36 4.22 - 5.81 MIL/uL   Hemoglobin 12.4 (L) 13.0 - 17.0 g/dL   HCT 60.439.0 54.039.0 - 98.152.0 %   MCV 89.4 78.0 - 100.0 fL   MCH 28.4 26.0 - 34.0 pg   MCHC 31.8 30.0 - 36.0 g/dL   RDW 19.114.0 47.811.5 - 29.515.5 %   Platelets 158 150 - 400 K/uL   Neutrophils Relative % 48 %   Neutro Abs 5.2 1.7 - 7.7 K/uL   Lymphocytes Relative 40 %   Lymphs Abs 4.3 (H) 0.7 - 4.0 K/uL   Monocytes Relative 8 %   Monocytes Absolute 0.8 0.1 - 1.0 K/uL   Eosinophils Relative 3 %   Eosinophils Absolute 0.3 0.0 - 0.7 K/uL   Basophils Relative 1 %   Basophils Absolute 0.1 0.0 - 0.1 K/uL  Comprehensive metabolic panel  Result Value Ref Range   Sodium 133 (L) 135 - 145 mmol/L   Potassium 4.0 3.5 - 5.1 mmol/L   Chloride 102 101 - 111 mmol/L   CO2 22 22 - 32 mmol/L   Glucose, Bld 169 (H) 65 - 99 mg/dL   BUN 27 (H) 6 - 20 mg/dL   Creatinine, Ser 6.215.77 (H) 0.61 - 1.24 mg/dL   Calcium 7.9 (L) 8.9 - 10.3 mg/dL   Total Protein 6.4 (L) 6.5 - 8.1 g/dL   Albumin 3.2 (L) 3.5 - 5.0 g/dL   AST 48 (H) 15 - 41 U/L   ALT 32 17 - 63 U/L   Alkaline Phosphatase 81 38 - 126 U/L   Total Bilirubin 0.5 0.3 - 1.2 mg/dL   GFR calc non Af Amer 10 (L) >60 mL/min   GFR calc Af Amer 12 (L) >60 mL/min   Anion gap 9 5 - 15  Urinalysis, Routine w reflex microscopic  Result Value Ref  Range   Color, Urine AMBER (A) YELLOW   APPearance CLOUDY (A) CLEAR   Specific Gravity, Urine 1.028 1.005 - 1.030   pH 5.0 5.0 - 8.0   Glucose, UA NEGATIVE NEGATIVE mg/dL   Hgb urine dipstick LARGE (A) NEGATIVE   Bilirubin Urine SMALL (A) NEGATIVE   Ketones, ur 15 (A) NEGATIVE mg/dL   Protein, ur 30 (A) NEGATIVE mg/dL   Nitrite NEGATIVE NEGATIVE   Leukocytes, UA NEGATIVE NEGATIVE  Lactic acid, plasma  Result Value Ref Range   Lactic Acid, Venous 1.2 0.5 - 1.9 mmol/L  Urine microscopic-add on  Result Value Ref Range   Squamous Epithelial / LPF 0-5 (A) NONE SEEN   WBC, UA 0-5 0 - 5 WBC/hpf   RBC / HPF 6-30 0 - 5 RBC/hpf   Bacteria, UA FEW (A) NONE SEEN   Casts HYALINE CASTS (A) NEGATIVE   Crystals CA OXALATE CRYSTALS (A) NEGATIVE   Urine-Other MUCOUS PRESENT   CBG monitoring, ED  Result Value Ref Range   Glucose-Capillary 168 (H) 65 - 99 mg/dL  I-Stat CG4 Lactic Acid, ED  Result Value Ref Range   Lactic Acid, Venous 1.15 0.5 - 1.9 mmol/L  I-stat troponin, ED  Result Value Ref Range   Troponin i, poc 0.02 0.00 - 0.08 ng/mL   Comment 3           Dg Chest 2 View  08/31/2015  CLINICAL DATA:  Dizziness.  Hypertension. EXAM: CHEST  2 VIEW COMPARISON:  June 25, 2015 FINDINGS: There is slight left base atelectasis. Lungs elsewhere clear. Heart is borderline enlarged with pulmonary vascularity within normal limits. No adenopathy. No bone lesions. IMPRESSION: Slight left base atelectasis. Lungs elsewhere clear. Heart borderline enlarged. Electronically Signed   By: Bretta BangWilliam  Woodruff III M.D.   On: 08/31/2015 18:57    Patient brought in by EMS for weakness and dizziness. EMS noted that his blood pressure was 80/44. They gave him 750 mL of normal saline and route pressures came up to 106/80. [facility] and was told that he may have to have a stent placed in a few weeks. Patient's been having some chest pain on and off. Patient upon arrival here had a  blood pressure systolic of 74 was not tachycardic. Patient received just fluids blood pressure is now up to 111/62. Never had a fever. Do not feel that the patient was septic. Lactic acid was normal. Troponin was normal. Urinalysis is abnormal for some hematuria and crystals. Patient's BUN and creatinine is consistent with renal failure prerenal type picture. Liver function tests without significant abnormalities. No leukocytosis or significant anemia.   Patient will require admission for the prerenal renal failure. Patient is already improving with IV fluids here. Patient has received a total of 2 L here plus the 750 that he was given by EMS. And then with his maintenance fluids he's probably received over 3 L.  CRITICAL CARE Performed by: Vanetta MuldersZACKOWSKI,Jiyaan Steinhauser Total critical care time: 30 minutes Critical care time was exclusive of separately billable procedures and treating other patients. Critical care was necessary to treat or prevent imminent or life-threatening deterioration. Critical care was time spent personally by me on the following activities: development of treatment plan with patient and/or surrogate as well as nursing, discussions with consultants, evaluation of patient's response to treatment, examination of patient, obtaining history from patient or surrogate, ordering and performing treatments and interventions, ordering and review of laboratory studies, ordering and review of radiographic studies, pulse oximetry and re-evaluation of patient's condition.   Vanetta MuldersScott Allsion Nogales, MD 08/31/15 2253

## 2015-08-31 NOTE — ED Provider Notes (Signed)
CSN: [id number]     Arrival date & time 08/31/15  1745 History   First MD Initiated Contact with Patient 08/31/15 1804     Chief Complaint  Patient presents with  . Weakness    HPI Comments: 50 year old male presents with generalized weakness. Patient is somewhat of a poor historian. Past medical history significant for homelessness, obesity, hypertension, spinal stenosis, anxiety, PTSD, GERD. Patient states that he has been feeling like something "ominous was going to happen" for the past week. He states he was helping his mother lift furniture in her house and he has been feeling bad ever since. Denies fever, chills, shortness of breath, cough, N/V/D. he does report chest pain which started last night. It's been constant and nonradiating. He also reports abdominal pain but he states that this is been chronic and been going on for months. He came to the emergency department because of his mother who called EMS. He was hypotensive on arrival blood pressures were 80/40. He received a fluid bolus which did improve his blood pressure. On review of EMR he has been receiving refills of his blood pressure medications here in the ED. Unclear if he was been taking them correctly.   Past Medical History  Diagnosis Date  . Hypertension   . Tachycardia - pulse   . Degenerative disk disease   . Spinal stenosis   . Degenerative disk disease   . Tobacco abuse   . Obesity   . Polysubstance abuse   . Chest pain     Hospital, March, 2014, negative enzymes, patient refused in-hospital  stress test, patient canceled outpatient stress test  . Spinal stenosis   . Depression   . Neuromuscular disorder (HCC)   . Anxiety     Panic attacks  . Shortness of breath dyspnea     with exertion  . PTSD (post-traumatic stress disorder)   . Incontinence of urine   . GERD (gastroesophageal reflux disease)   . Constipation    Past Surgical History  Procedure Laterality Date  . Wisdom tooth extraction    .  Esophagogastroduodenoscopy (egd) with propofol N/A 08/24/2014    Procedure: ESOPHAGOGASTRODUODENOSCOPY (EGD) WITH PROPOFOL;  Surgeon: Charlott Rakes, MD;  Location: [facility] ENDOSCOPY;  Service: Endoscopy;  Laterality: N/A;  . Colonoscopy N/A 08/24/2014    Procedure: COLONOSCOPY;  Surgeon: Charlott Rakes, MD;  Location: [facility] ENDOSCOPY;  Service: Endoscopy;  Laterality: N/A;   Family History  Problem Relation Age of Onset  . Stroke Mother   . Hypertension Mother   . Hyperlipidemia Mother   . Stroke Father   . Hypertension Father   . Dementia Father   . Diabetes Father   . Heart disease Father   . Hyperlipidemia Father   . Cancer Sister     brain  . Diabetes Sister   . Heart disease Sister   . Hyperlipidemia Sister   . Hypertension Sister   . Stroke Sister   . Heart disease Brother   . Hyperlipidemia Brother    Social History  Substance Use Topics  . Smoking status: Former Smoker -- 0.02 packs/day for 27 years    Types: Cigarettes    Quit date: 04/05/2015  . Smokeless tobacco: Never Used     Comment: "quit April 2016, I havwe one every now and then"  . Alcohol Use: No    Review of Systems  Constitutional: Negative for fever and chills.  Respiratory: Negative for shortness of breath.   Cardiovascular: Positive for chest pain.  Gastrointestinal:  Positive for abdominal pain. Negative for nausea, vomiting and diarrhea.  All other systems reviewed and are negative.     Allergies  Blueberry flavor; Penicillins; Robaxin; and Toradol  Home Medications   Prior to Admission medications   Medication Sig Start Date End Date Taking? Authorizing Provider  albuterol (PROVENTIL HFA;VENTOLIN HFA) 108 (90 Base) MCG/ACT inhaler Inhale 2 puffs into the lungs every 6 (six) hours as needed for wheezing or shortness of breath.    Historical Provider, MD  cloNIDine (CATAPRES) 0.3 MG tablet Take 1 tablet (0.3 mg total) by mouth 2 (two) times daily. 08/19/15   [name], PA-C   cloNIDine (CATAPRES) 0.3 MG tablet Take 1 tablet (0.3 mg total) by mouth 2 (two) times daily. 08/25/15   Tatyana Kirichenko, PA-C  cyclobenzaprine (FLEXERIL) 5 MG tablet Take 5 mg by mouth daily.    Historical Provider, MD  gabapentin (NEURONTIN) 300 MG capsule Take 300 mg by mouth daily.    Historical Provider, MD  lisinopril (PRINIVIL,ZESTRIL) 20 MG tablet Take 1 tablet (20 mg total) by mouth daily. 08/19/15   [name], PA-C  metoprolol tartrate (LOPRESSOR) 25 MG tablet Take 1 tablet (25 mg total) by mouth 2 (two) times daily. 07/30/15   Renne CriglerJoshua Geiple, PA-C  traMADol (ULTRAM) 50 MG tablet Take 1-2 tablets by mouth every six hours as needed for pain.  Do not use >8 tabs/day. 08/19/15   [name], PA-C   BP 111/62 mmHg  Pulse 80  Temp(Src) 98.2 F (36.8 C) (Oral)  Resp 21  SpO2 96%   Physical Exam  Constitutional: He is oriented to person, place, and time. No distress.  Disheveled; Appears older than stated age; Obese  HENT:  Head: Normocephalic and atraumatic.  Eyes: Conjunctivae are normal. Pupils are equal, round, and reactive to light. Right eye exhibits no discharge. Left eye exhibits no discharge. No scleral icterus.  Neck: Normal range of motion.  Cardiovascular: Normal rate, regular rhythm and intact distal pulses.  Exam reveals no gallop and no friction rub.   No murmur heard. Pulmonary/Chest: Effort normal and breath sounds normal. No respiratory distress. He has no wheezes. He has no rales. He exhibits no tenderness.  Abdominal: Soft. He exhibits no distension and no mass. There is tenderness. There is no rebound and no guarding.  Mild epigastric tenderness  Neurological: He is alert and oriented to person, place, and time.  Skin: Skin is warm and dry. He is not diaphoretic.  Psychiatric: He has a normal mood and affect.    ED Course  Procedures (including critical care time) Labs Review Labs Reviewed  CBC WITH DIFFERENTIAL/PLATELET - Abnormal;  Notable for the following:    Hemoglobin 12.4 (*)    Lymphs Abs 4.3 (*)    All other components within normal limits  COMPREHENSIVE METABOLIC PANEL - Abnormal; Notable for the following:    Sodium 133 (*)    Glucose, Bld 169 (*)    BUN 27 (*)    Creatinine, Ser 5.77 (*)    Calcium 7.9 (*)    Total Protein 6.4 (*)    Albumin 3.2 (*)    AST 48 (*)    GFR calc non Af Amer 10 (*)    GFR calc Af Amer 12 (*)    All other components within normal limits  URINALYSIS, ROUTINE W REFLEX MICROSCOPIC (NOT AT [facility]) - Abnormal; Notable for the following:    Color, Urine AMBER (*)    APPearance CLOUDY (*)  Hgb urine dipstick LARGE (*)    Bilirubin Urine SMALL (*)    Ketones, ur 15 (*)    Protein, ur 30 (*)    All other components within normal limits  URINE MICROSCOPIC-ADD ON - Abnormal; Notable for the following:    Squamous Epithelial / LPF 0-5 (*)    Bacteria, UA FEW (*)    Casts HYALINE CASTS (*)    Crystals CA OXALATE CRYSTALS (*)    All other components within normal limits  CBG MONITORING, ED - Abnormal; Notable for the following:    Glucose-Capillary 168 (*)    All other components within normal limits  URINE CULTURE  LACTIC ACID, PLASMA  I-STAT CG4 LACTIC ACID, ED  Rosezena SensorI-STAT TROPOININ, ED    Imaging Review Dg Chest 2 View  08/31/2015  CLINICAL DATA:  Dizziness.  Hypertension. EXAM: CHEST  2 VIEW COMPARISON:  June 25, 2015 FINDINGS: There is slight left base atelectasis. Lungs elsewhere clear. Heart is borderline enlarged with pulmonary vascularity within normal limits. No adenopathy. No bone lesions. IMPRESSION: Slight left base atelectasis. Lungs elsewhere clear. Heart borderline enlarged. Electronically Signed   By: Bretta BangWilliam  Woodruff III M.D.   On: 08/31/2015 18:57   I have personally reviewed and evaluated these images and lab results as part of my medical decision-making.   EKG Interpretation   Date/Time:  Thursday August 31 2015 18:52:03 EDT Ventricular Rate:  77 PR  Interval:    QRS Duration: 97 QT Interval:  401 QTC Calculation: 454 R Axis:   -8 Text Interpretation:  Sinus rhythm Left ventricular hypertrophy Confirmed  by Deretha EmoryZACKOWSKI  MD, SCOTT [phone]([id number]) on 08/31/2015 7:30:49 PM      MDM   Final diagnoses:  Renal failure   50 year old male who presents with hypotension and AKI. BPs have improved with 2L IVF. He is afebrile, not tachycardic, not tachypneic, and not hypoxic. CMP remarkable for elevated BUN/SCr of 27/5.77 up from when compared to 2 months ago 11/1.00. UA is remarkable for large Hgb, small bilirubin, ketones, and protein with hyaline casts and Ca oxalate crystals.   Lactic acid normal. Cardiac work up is negative - Troponin normal and EKG is NSR. CXR negative. CBC overall unremarkable.   Will have patient admitted for AKI. Hypotension has improved markedly with fluids. Will obtain Renal US. Patient will be admitted by Dr. Toniann FailKakrakandy.   Bethel BornKelly Marie Jamarco Zaldivar, PA-C 09/01/15 [phone]

## 2015-08-31 NOTE — ED Notes (Signed)
PA aware of BP

## 2015-08-31 NOTE — ED Notes (Signed)
Per [facility] patient complains of weakness and dizziness.  EMS states on arrival blood pressure 80/44.  Patient received 750mL NS en route, pressure 106/80.  Patient states he was recently seen at [facility] and told he would have a stent placed in a few weeks.

## 2015-08-31 NOTE — ED Notes (Signed)
Patient transported to MRI 

## 2015-09-01 ENCOUNTER — Inpatient Hospital Stay (HOSPITAL_COMMUNITY): Payer: Self-pay

## 2015-09-01 ENCOUNTER — Encounter (HOSPITAL_COMMUNITY): Payer: Self-pay | Admitting: Internal Medicine

## 2015-09-01 DIAGNOSIS — G9340 Encephalopathy, unspecified: Secondary | ICD-10-CM

## 2015-09-01 DIAGNOSIS — I959 Hypotension, unspecified: Secondary | ICD-10-CM | POA: Diagnosis present

## 2015-09-01 DIAGNOSIS — N179 Acute kidney failure, unspecified: Principal | ICD-10-CM

## 2015-09-01 LAB — CBC
HEMATOCRIT: 39.9 % (ref 39.0–52.0)
HEMOGLOBIN: 12.6 g/dL — AB (ref 13.0–17.0)
MCH: 28.4 pg (ref 26.0–34.0)
MCHC: 31.6 g/dL (ref 30.0–36.0)
MCV: 90.1 fL (ref 78.0–100.0)
Platelets: 139 10*3/uL — ABNORMAL LOW (ref 150–400)
RBC: 4.43 MIL/uL (ref 4.22–5.81)
RDW: 13.8 % (ref 11.5–15.5)
WBC: 10.3 10*3/uL (ref 4.0–10.5)

## 2015-09-01 LAB — COMPREHENSIVE METABOLIC PANEL
ALBUMIN: 3.2 g/dL — AB (ref 3.5–5.0)
ALT: 32 U/L (ref 17–63)
ANION GAP: 8 (ref 5–15)
AST: 45 U/L — AB (ref 15–41)
Alkaline Phosphatase: 76 U/L (ref 38–126)
BILIRUBIN TOTAL: 1 mg/dL (ref 0.3–1.2)
BUN: 26 mg/dL — ABNORMAL HIGH (ref 6–20)
CHLORIDE: 100 mmol/L — AB (ref 101–111)
CO2: 23 mmol/L (ref 22–32)
Calcium: 7.8 mg/dL — ABNORMAL LOW (ref 8.9–10.3)
Creatinine, Ser: 3.93 mg/dL — ABNORMAL HIGH (ref 0.61–1.24)
GFR calc Af Amer: 19 mL/min — ABNORMAL LOW (ref >60)
GFR calc non Af Amer: 17 mL/min — ABNORMAL LOW (ref >60)
GLUCOSE: 168 mg/dL — AB (ref 65–99)
POTASSIUM: 3.8 mmol/L (ref 3.5–5.1)
SODIUM: 131 mmol/L — AB (ref 135–145)
TOTAL PROTEIN: 6.4 g/dL — AB (ref 6.5–8.1)

## 2015-09-01 LAB — TROPONIN I

## 2015-09-01 LAB — AMMONIA: AMMONIA: 48 umol/L — AB (ref 9–35)

## 2015-09-01 LAB — CK: CK TOTAL: 2438 U/L — AB (ref 49–397)

## 2015-09-01 LAB — SODIUM, URINE, RANDOM: Sodium, Ur: 25 mmol/L

## 2015-09-01 LAB — CREATININE, URINE, RANDOM: CREATININE, URINE: 417.28 mg/dL

## 2015-09-01 MED ORDER — SODIUM CHLORIDE 0.9 % IV BOLUS (SEPSIS)
1000.0000 mL | Freq: Once | INTRAVENOUS | Status: AC
  Administered 2015-09-01: 1000 mL via INTRAVENOUS

## 2015-09-01 MED ORDER — SODIUM CHLORIDE 0.9 % IV SOLN
INTRAVENOUS | Status: DC
  Administered 2015-09-01: 03:00:00 via INTRAVENOUS

## 2015-09-01 MED ORDER — HYDRALAZINE HCL 20 MG/ML IJ SOLN: 10.0000 mg | INTRAMUSCULAR | Status: DC | PRN

## 2015-09-01 MED ORDER — ALBUTEROL SULFATE (2.5 MG/3ML) 0.083% IN NEBU: 2.5000 mg | INHALATION_SOLUTION | Freq: Four times a day (QID) | RESPIRATORY_TRACT | Status: DC | PRN

## 2015-09-01 NOTE — Progress Notes (Signed)
Patient arrived on floor, after vital signs, patient told RN he wanted to leave and that his blood pressure was fine. Patient is alert and oriented x4 and states he has to go take care of his mother. MD Max FickleKakrakandy notified, Craige CottaKirby, NP notified as well. NP came to bedside to talk to patient, patient insisted he would leave. Against medical advice form signed by patient. Patient left floor @ 05:15am.

## 2015-09-01 NOTE — H&P (Addendum)
History and Physical    Travis LimboJames Yannuzzi ZOX:[id number] DOB: 22-Dec-1965 DOA: 08/31/2015  PCP: Jacklynn BarnaclePLACEY,MARY H, NP  Patient coming from: Home.  Chief Complaint: Confusion and weakness.  History obtained from patient's nurse ER physician as patient is confused and no family available at this time. I tried to reach patient's mother through the phone but was unable to.  HPI: Travis [name] is a 50 y.o. male with hypertension and chronic low back pain was brought to the ER after patient was found to be confused and weak. In the ER patient was hypotensive with blood pressure in the 80 systolic. Patient's creatinine also was found to be markedly elevated at around 5. Patient's recent lab works done 2 months ago was showing normal creatinine. UA shows some RBCs otherwise does not show any casts. Patient is afebrile. Patient is mildly confused but follows commands and moves all extremities. Patient denies taking any new medications except his antihypertensives and tramadol. Patient states that he does take some ibuprofen off and on for his back pain. Last time he took was 3 weeks ago. Patient has been admitted for acute renal failure and acute encephalopathy.   ED Course: Patient was given fluid bolus at least 3 L.  Review of Systems: As per HPI, rest all negative.   Past Medical History  Diagnosis Date  . Hypertension   . Tachycardia - pulse   . Degenerative disk disease   . Spinal stenosis   . Degenerative disk disease   . Tobacco abuse   . Obesity   . Polysubstance abuse   . Chest pain     Hospital, March, 2014, negative enzymes, patient refused in-hospital  stress test, patient canceled outpatient stress test  . Spinal stenosis   . Depression   . Neuromuscular disorder (HCC)   . Anxiety     Panic attacks  . Shortness of breath dyspnea     with exertion  . PTSD (post-traumatic stress disorder)   . Incontinence of urine   . GERD (gastroesophageal reflux disease)   . Constipation      Past Surgical History  Procedure Laterality Date  . Wisdom tooth extraction    . Esophagogastroduodenoscopy (egd) with propofol N/A 08/24/2014    Procedure: ESOPHAGOGASTRODUODENOSCOPY (EGD) WITH PROPOFOL;  Surgeon: Charlott RakesVincent Schooler, MD;  [facility];  Service: Endoscopy;  Laterality: N/A;  . Colonoscopy N/A 08/24/2014    Procedure: COLONOSCOPY;  Surgeon: Charlott RakesVincent Schooler, MD;  [facility];  Service: Endoscopy;  Laterality: N/A;     reports that he quit smoking about 4 months ago. His smoking use included Cigarettes. He has a .54 pack-year smoking history. He has never used smokeless tobacco. He reports that he does not drink alcohol or use illicit drugs.  Allergies  Allergen Reactions  . Blueberry Flavor Hives  . Penicillins Hives, Itching and Other (See Comments)    Hallucinations. Has patient had a PCN reaction causing immediate rash, facial/tongue/throat swelling, SOB or lightheadedness with hypotension:YES Has patient had a PCN reaction causing severe rash involving mucus membranes or skin necrosis: NO Has patient had a PCN reaction that required hospitalizationNO Has patient had a PCN reaction occurring within the last 10 years: NO If all of the above answers are "NO", then may proceed with Cephalosporin use.  . Robaxin [Methocarbamol] Palpitations  . Toradol [Ketorolac Tromethamine] Hives and Other (See Comments)    Headache    Family History  Problem Relation Age of Onset  . Stroke Mother   . Hypertension  Mother   . Hyperlipidemia Mother   . Stroke Father   . Hypertension Father   . Dementia Father   . Diabetes Father   . Heart disease Father   . Hyperlipidemia Father   . Cancer Sister     brain  . Diabetes Sister   . Heart disease Sister   . Hyperlipidemia Sister   . Hypertension Sister   . Stroke Sister   . Heart disease Brother   . Hyperlipidemia Brother     Prior to Admission medications   Medication Sig Start Date End Date Taking?  Authorizing Provider  albuterol (PROVENTIL HFA;VENTOLIN HFA) 108 (90 Base) MCG/ACT inhaler Inhale 2 puffs into the lungs every 6 (six) hours as needed for wheezing or shortness of breath.    Historical Provider, MD  cloNIDine (CATAPRES) 0.3 MG tablet Take 1 tablet (0.3 mg total) by mouth 2 (two) times daily. 08/19/15   [name], PA-C  cloNIDine (CATAPRES) 0.3 MG tablet Take 1 tablet (0.3 mg total) by mouth 2 (two) times daily. 08/25/15   Tatyana Kirichenko, PA-C  cyclobenzaprine (FLEXERIL) 5 MG tablet Take 5 mg by mouth daily.    Historical Provider, MD  gabapentin (NEURONTIN) 300 MG capsule Take 300 mg by mouth daily.    Historical Provider, MD  lisinopril (PRINIVIL,ZESTRIL) 20 MG tablet Take 1 tablet (20 mg total) by mouth daily. 08/19/15   [name], PA-C  metoprolol tartrate (LOPRESSOR) 25 MG tablet Take 1 tablet (25 mg total) by mouth 2 (two) times daily. 07/30/15   Renne CriglerJoshua Geiple, PA-C  traMADol (ULTRAM) 50 MG tablet Take 1-2 tablets by mouth every six hours as needed for pain.  Do not use >8 tabs/day. 08/19/15   [name], PA-C    Physical Exam: Filed Vitals:   08/31/15 2200 08/31/15 2215 08/31/15 2230 08/31/15 2245  BP: 102/72 111/62 107/68 101/71  Pulse: 84 80 81 86  Temp:      TempSrc:      Resp: 19 21 12 11   SpO2: 94% 96% 94% 95%      Constitutional: Not in distress. Filed Vitals:   08/31/15 2200 08/31/15 2215 08/31/15 2230 08/31/15 2245  BP: 102/72 111/62 107/68 101/71  Pulse: 84 80 81 86  Temp:      TempSrc:      Resp: 19 21 12 11   SpO2: 94% 96% 94% 95%   Eyes: Anicteric no pallor. ENMT: No discharge from the ears eyes nose and mouth. Neck: No mass felt. No JVD appreciated. Respiratory: No rhonchi or crepitations. Cardiovascular: S1 and S2 heard. Abdomen: Soft nontender bowel sounds present. Musculoskeletal: No edema. Skin: No rash. Neurologic: Alert awake oriented to his name and place. Moves all extremities. Psychiatric: Mildly  confused.   Labs on Admission: I have personally reviewed following labs and imaging studies  CBC:  Recent Labs Lab 08/31/15 1918  WBC 10.4  NEUTROABS 5.2  HGB 12.4*  HCT 39.0  MCV 89.4  PLT 158   Basic Metabolic Panel:  Recent Labs Lab 08/31/15 1918  NA 133*  K 4.0  CL 102  CO2 22  GLUCOSE 169*  BUN 27*  CREATININE 5.77*  CALCIUM 7.9*   GFR: Estimated Creatinine Clearance: 21.3 mL/min (by C-G formula based on Cr of 5.77). Liver Function Tests:  Recent Labs Lab 08/31/15 1918  AST 48*  ALT 32  ALKPHOS 81  BILITOT 0.5  PROT 6.4*  ALBUMIN 3.2*   No results for input(s): LIPASE, AMYLASE in the last  168 hours. No results for input(s): AMMONIA in the last 168 hours. Coagulation Profile: No results for input(s): INR, PROTIME in the last 168 hours. Cardiac Enzymes: No results for input(s): CKTOTAL, CKMB, CKMBINDEX, TROPONINI in the last 168 hours. BNP (last 3 results) No results for input(s): PROBNP in the last 8760 hours. HbA1C: No results for input(s): HGBA1C in the last 72 hours. CBG:  Recent Labs Lab 08/31/15 1849  GLUCAP 168*   Lipid Profile: No results for input(s): CHOL, HDL, LDLCALC, TRIG, CHOLHDL, LDLDIRECT in the last 72 hours. Thyroid Function Tests: No results for input(s): TSH, T4TOTAL, FREET4, T3FREE, THYROIDAB in the last 72 hours. Anemia Panel: No results for input(s): VITAMINB12, FOLATE, FERRITIN, TIBC, IRON, RETICCTPCT in the last 72 hours. Urine analysis:    Component Value Date/Time   COLORURINE AMBER* 08/31/2015 2158   APPEARANCEUR CLOUDY* 08/31/2015 2158   LABSPEC 1.028 08/31/2015 2158   PHURINE 5.0 08/31/2015 2158   GLUCOSEU NEGATIVE 08/31/2015 2158   GLUCOSEU NEG mg/dL 16/12/9602 5409   HGBUR LARGE* 08/31/2015 2158   BILIRUBINUR SMALL* 08/31/2015 2158   BILIRUBINUR SMALL 08/16/2013 1351   KETONESUR 15* 08/31/2015 2158   PROTEINUR 30* 08/31/2015 2158   PROTEINUR 30 08/16/2013 1351   UROBILINOGEN 0.2 04/05/2014 0127    UROBILINOGEN 1.0 08/16/2013 1351   NITRITE NEGATIVE 08/31/2015 2158   NITRITE NEG 08/16/2013 1351   LEUKOCYTESUR NEGATIVE 08/31/2015 2158   Sepsis Labs: (procalcitonin:4,lacticidven:4) )No results found for this or any previous visit (from the past 240 hour(s)).   Radiological Exams on Admission: Dg Chest 2 View  08/31/2015  CLINICAL DATA:  Dizziness.  Hypertension. EXAM: CHEST  2 VIEW COMPARISON:  June 25, 2015 FINDINGS: There is slight left base atelectasis. Lungs elsewhere clear. Heart is borderline enlarged with pulmonary vascularity within normal limits. No adenopathy. No bone lesions. IMPRESSION: Slight left base atelectasis. Lungs elsewhere clear. Heart borderline enlarged. Electronically Signed   By: Bretta Bang III M.D.   On: 08/31/2015 18:57   US Renal  08/31/2015  CLINICAL DATA:  50 year old male with renal failure. EXAM: RENAL / URINARY TRACT ULTRASOUND COMPLETE COMPARISON:  Abdominal CT dated 08/25/2014 FINDINGS: Evaluation is limited due to suboptimal penetration of the soft tissue. Right Kidney: Length: 12.6 cm. There are two hypoechoic lesion in the right kidney measuring up to 4.2 x 4.0 x 5.4 cm. These lesions are not well evaluated but most likely represent cysts. There is no hydronephrosis or echogenic stone. Left Kidney: Length: 12.2 cm. Echogenicity within normal limits. No mass or hydronephrosis visualized. Bladder: Appears normal for degree of bladder distention. IMPRESSION: No hydronephrosis or echogenic stone. Probable bilateral renal cysts. Electronically Signed   By: Elgie Collard M.D.   On: 08/31/2015 23:44    EKG: Independently reviewed. Normal sinus rhythm and LVH.  Assessment/Plan Principal Problem:   ARF (acute renal failure) (HCC) Active Problems:   Hypertension   Spinal stenosis   Hypotension   Acute encephalopathy    1. Acute renal failure nonoliguric - cause not clear. Patient was hypotensive on arrival which could be contributing  to his renal failure. Patient states he also took some NSAIDs 2 weeks ago. Will need to get further history from patient's family when available or when patient is becoming more alert and awake. Continue with hydration and hold antihypertensives for now. Renal sonogram does not show any obstruction. Check FENa. Check CK levels. Closely follow intake output. Since patient's urine does show blood cells patient will need close follow-up and possible referral to  urologist. 2. Acute encephalopathy - cause not clear. Maybe metabolic given the patient's renal failure. CT head is pending. Check urine drug screen ammonia levels. If confusion persists we'll get MRI brain. I'm holding off patient's Neurontin Flexeril and tramadol for now.  3. Hypertension - this patient presented with hypotension holding off antihypertensives. When necessary IV hydralazine for systolic blood pressure more than 160. 4. History of chronic low back pain - see #2. 5. Mild anemia - has had EGD and colonoscopy last June 2016. EGD showed hemorrhagic gastritis and colonoscopy showed diverticulosis with internal hemorrhoids. Follow CBC closely.   Addendum - since patient's platelets are decreasing I have ordered LDH to make sure there is no hemolytic process going on. CK levels are elevated. Continue with hydration and closely follow CK levels. Will check EEG.   DVT prophylaxis: SCDs. Code Status: Full code.  Family Communication: Unable to reach family.  Disposition Plan: Home.  Consults called: None.  Admission status: Inpatient. Telemetry.    Eduard Clos MD [facility] Pager [phone].  If 7PM-7AM, please contact night-coverage [email or web address] Password [email or web address]  09/01/2015, 1:34 AM

## 2015-09-01 NOTE — Progress Notes (Signed)
RN, Mercy, paged this NP because pt wanted to leave AMA. Pt was just admitted tonight and had just arrived to the floor from the ED. Discussed with Dr. Toniann FailKakrakandy who admitted pt tonight and he asked this NP to see pt.  When NP arrives to room, pt is standing at sink and then sits on bed. IV has already been removed. Talked with pt for awhile. He is alert and oriented. He knows he came to ED because his BP was low and he felt bad. States his brother brought him. NP reviewed the abnormal findings and the reasons we would like for him to stay-treatment, more tests, possible specialists involvement, kidney function is "bad", he needs fluids, etc etc. He said he knows but he has to leave. States he feels better, but NP explained that this is likely just temporary as he needs further treatment.  His reasons for needing to leave are that he lives with his elderly mother who has dementia and he is worried about her getting out of the house. NP asked if there was another friend or family member who we could call to stay with his mother. He replied "they won't do it". States his brother "hasn't seen his mom but 2 times in 8 years". He admits to a sister, but states he would have to go to her to "talk her into helping him". Travis [name] states he will try to get his sister to stay with his mom after he talks with her, and if she will, he will come back tonight. He also stated he would go see "Travis [name]" today. Clarification-Travis [name] is his primary care NP. NP offered to call police to do a welfare check on his mother, but he refused that option as well.  After discussion, NP finds no reason for pt to be held here. NP asked him to call 911 if he felt worse and to come back as soon as he had his mom settled. If he doesn't return here, go see Travis [name] today. He voiced understanding.  Dr. Toniann FailKakrakandy notified about situation. Mercy, RN was present for entire meeting with pt.  [name], NP [facility]

## 2015-09-02 ENCOUNTER — Encounter (HOSPITAL_COMMUNITY): Payer: Self-pay | Admitting: Emergency Medicine

## 2015-09-02 ENCOUNTER — Emergency Department (HOSPITAL_COMMUNITY)
Admission: EM | Admit: 2015-09-02 | Discharge: 2015-09-02 | Disposition: A | Discharge status: Home / Self Care | Payer: No Typology Code available for payment source | Attending: Emergency Medicine | Admitting: Emergency Medicine

## 2015-09-02 DIAGNOSIS — I10 Essential (primary) hypertension: Secondary | ICD-10-CM | POA: Insufficient documentation

## 2015-09-02 DIAGNOSIS — Z87891 Personal history of nicotine dependence: Secondary | ICD-10-CM | POA: Insufficient documentation

## 2015-09-02 DIAGNOSIS — Z760 Encounter for issue of repeat prescription: Secondary | ICD-10-CM | POA: Insufficient documentation

## 2015-09-02 DIAGNOSIS — E669 Obesity, unspecified: Secondary | ICD-10-CM | POA: Insufficient documentation

## 2015-09-02 DIAGNOSIS — F329 Major depressive disorder, single episode, unspecified: Secondary | ICD-10-CM | POA: Insufficient documentation

## 2015-09-02 LAB — URINE CULTURE: CULTURE: NO GROWTH

## 2015-09-02 MED ORDER — CLONIDINE HCL 0.3 MG PO TABS: 0.3000 mg | ORAL_TABLET | Freq: Two times a day (BID) | ORAL | Status: DC

## 2015-09-02 NOTE — ED Notes (Signed)
Pt stated that he was off of his medication for 3 days. Stated that his BP was 180/140 this am, "because he needed his medication"

## 2015-09-02 NOTE — ED Notes (Addendum)
Pt from homeless shelter with complaints of hypertension. Pt states he either lost his blood pressure medicine 2 days ago or someone stole them. Pt states [facility] and they could not see him to refill it till Friday. Pt denies cp, sob, or ha. Pt only has complaints of chronic back pain

## 2015-09-02 NOTE — Discharge Instructions (Signed)
Please follow-up with your primary care provider at your scheduled appointment or further refills. Please return to emergency department if you develop any new or worsening symptoms, such as chest pain, shortness of breath, difficulty urinating, or any other new or concerning symptoms.  Medicine Refill at the Emergency Department We have refilled your medicine today, but it is best for you to get refills through your primary health care provider's office. In the future, please plan ahead so you do not need to get refills from the emergency department. If the medicine we refilled was a maintenance medicine, you may have received only enough to get you by until you are able to see your regular health care provider.   This information is not intended to replace advice given to you by your health care provider. Make sure you discuss any questions you have with your health care provider.   Document Released: 06/07/2003 Document Revised: 03/11/2014 Document Reviewed: 05/28/2013 Elsevier Interactive Patient Education [employer]2016 Elsevier Inc.
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
## 2015-09-06 ENCOUNTER — Encounter (HOSPITAL_COMMUNITY): Payer: Self-pay | Admitting: Emergency Medicine

## 2015-09-06 ENCOUNTER — Emergency Department (HOSPITAL_COMMUNITY)
Admission: EM | Admit: 2015-09-06 | Discharge: 2015-09-06 | Disposition: A | Discharge status: Home / Self Care | Payer: No Typology Code available for payment source | Attending: Emergency Medicine | Admitting: Emergency Medicine

## 2015-09-06 DIAGNOSIS — Z79899 Other long term (current) drug therapy: Secondary | ICD-10-CM | POA: Insufficient documentation

## 2015-09-06 DIAGNOSIS — R519 Headache, unspecified: Secondary | ICD-10-CM

## 2015-09-06 DIAGNOSIS — R51 Headache: Secondary | ICD-10-CM | POA: Insufficient documentation

## 2015-09-06 DIAGNOSIS — F329 Major depressive disorder, single episode, unspecified: Secondary | ICD-10-CM | POA: Insufficient documentation

## 2015-09-06 DIAGNOSIS — Z87891 Personal history of nicotine dependence: Secondary | ICD-10-CM | POA: Insufficient documentation

## 2015-09-06 DIAGNOSIS — Z791 Long term (current) use of non-steroidal anti-inflammatories (NSAID): Secondary | ICD-10-CM | POA: Insufficient documentation

## 2015-09-06 DIAGNOSIS — I10 Essential (primary) hypertension: Secondary | ICD-10-CM | POA: Insufficient documentation

## 2015-09-06 DIAGNOSIS — Z7951 Long term (current) use of inhaled steroids: Secondary | ICD-10-CM | POA: Insufficient documentation

## 2015-09-06 MED ORDER — CLONIDINE HCL 0.1 MG PO TABS
0.3000 mg | ORAL_TABLET | Freq: Once | ORAL | Status: AC
  Administered 2015-09-06: 0.3 mg via ORAL
  Filled 2015-09-06: qty 3

## 2015-09-06 MED ORDER — LISINOPRIL 20 MG PO TABS
20.0000 mg | ORAL_TABLET | Freq: Once | ORAL | Status: AC
  Administered 2015-09-06: 20 mg via ORAL
  Filled 2015-09-06: qty 1

## 2015-09-06 MED ORDER — CLONIDINE HCL 0.3 MG PO TABS: 0.3000 mg | ORAL_TABLET | Freq: Two times a day (BID) | ORAL | Status: DC

## 2015-09-06 MED ORDER — TRAMADOL HCL 50 MG PO TABS
50.0000 mg | ORAL_TABLET | Freq: Once | ORAL | Status: AC
  Administered 2015-09-06: 50 mg via ORAL
  Filled 2015-09-06: qty 1

## 2015-09-06 MED ORDER — LISINOPRIL 20 MG PO TABS: 20.0000 mg | ORAL_TABLET | Freq: Every day | ORAL | Status: DC

## 2015-09-06 NOTE — Discharge Instructions (Signed)
Please read and follow all provided instructions.  Your diagnoses today include:  1. Acute nonintractable headache, unspecified headache type   2. Essential hypertension     Tests performed today include:  Vital signs. See below for your results today.   Medications:  Take any prescribed medications only as directed.  Additional information:  Follow any educational materials contained in this packet.  You are having a headache. No specific cause was found today for your headache. It may have been a migraine or other cause of headache. Stress, anxiety, fatigue, and depression are common triggers for headaches.   Your headache today does not appear to be life-threatening or require hospitalization, but often the exact cause of headaches is not determined in the emergency department. Therefore, follow-up with your doctor is very important to find out what may have caused your headache and whether or not you need any further diagnostic testing or treatment.   Sometimes headaches can appear benign (not harmful), but then more serious symptoms can develop which should prompt an immediate re-evaluation by your doctor or the emergency department.  BE VERY CAREFUL not to take multiple medicines containing Tylenol (also called acetaminophen). Doing so can lead to an overdose which can damage your liver and cause liver failure and possibly death.   Follow-up instructions: Please follow-up with your primary care provider in the next 3 days for further evaluation of your symptoms.   Return instructions:   Please return to the Emergency Department if you experience worsening symptoms.  Return if the medications do not resolve your headache, if it recurs, or if you have multiple episodes of vomiting or cannot keep down fluids.  Return if you have a change from the usual headache.  RETURN IMMEDIATELY IF you:  Develop a sudden, severe headache  Develop confusion or become poorly responsive or  faint  Develop a fever above 100.71F or problem breathing  Have a change in speech, vision, swallowing, or understanding  Develop new weakness, numbness, tingling, incoordination in your arms or legs  Have a seizure  Please return if you have any other emergent concerns.  Additional Information:  Your vital signs today were: BP 151/95 mmHg   Pulse 71   Temp(Src) 98.3 F (36.8 C) (Oral)   Resp 18   SpO2 98% If your blood pressure (BP) was elevated above 135/85 this visit, please have this repeated by your doctor within one month. --------------
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
## 2015-09-28 NOTE — Discharge Summary (Signed)
Patient left against medical admission.  Course in the Hospital - Travis [name] is a 50 y.o. male with hypertension and chronic low back pain was brought to the ER after patient was found to be confused and weak. In the ER patient was hypotensive with blood pressure in the 80 systolic. Patient's creatinine also was found to be markedly elevated at around 5. Patient's recent lab works done 2 months ago was showing normal creatinine. UA shows some RBCs otherwise does not show any casts. Patient is afebrile. Patient is mildly confused but follows commands and moves all extremities. Patient denies taking any new medications except his antihypertensives and tramadol. Patient states that he does take some ibuprofen off and on for his back pain. Last time he took was 3 weeks ago. Patient has been admitted for acute renal failure and acute encephalopathy.   After admission patient became more alert and awake and in the morning patient was well-oriented and was demanding to be discharged home AGAINST MEDICAL ADVICE since patient wants to take care of his mother. Patient was strongly advised that he will need further treatment given his acute renal failure. Patient states he will follow-up with his primary care physician and his brother is coming to pick him up. Patient did not wait for any further instruction and left AGAINST MEDICAL ADVICE.  ED Course: Patient was given fluid bolus at least 3 L.   Discharge diagnosis -   1. Acute renal failure nonoliguric - cause not clear. Patient was hypotensive on arrival which could be contributing to his renal failure. Patient states he also took some NSAIDs 2 weeks ago. Will need to get further history from patient's family when available or when patient is becoming more alert and awake. Continue with hydration and hold antihypertensives for now. Renal sonogram does not show any obstruction. Check FENa. Check CK levels. Closely follow intake output. Since patient's urine  does show blood cells patient will need close follow-up and possible referral to urologist. 2. Acute encephalopathy - cause not clear. Maybe metabolic given the patient's renal failure. CT head is pending. Check urine drug screen ammonia levels. If confusion persists we'll get MRI brain. I'm holding off patient's Neurontin Flexeril and tramadol for now.  3. Hypertension - this patient presented with hypotension holding off antihypertensives. When necessary IV hydralazine for systolic blood pressure more than 160. 4. History of chronic low back pain - see #2. 5. Mild anemia - has had EGD and colonoscopy last June 2016. EGD showed hemorrhagic gastritis and colonoscopy showed diverticulosis with internal hemorrhoids. Follow CBC closely.  Patient left AGAINST MEDICAL ADVICE. Did not wait for any further instruction.   Midge Minium.

## 2015-09-29 ENCOUNTER — Emergency Department (HOSPITAL_COMMUNITY)
Admission: EM | Admit: 2015-09-29 | Discharge: 2015-09-29 | Disposition: A | Discharge status: Home / Self Care | Payer: No Typology Code available for payment source | Attending: Emergency Medicine | Admitting: Emergency Medicine

## 2015-09-29 ENCOUNTER — Encounter (HOSPITAL_COMMUNITY): Payer: Self-pay

## 2015-09-29 ENCOUNTER — Emergency Department (HOSPITAL_COMMUNITY): Payer: No Typology Code available for payment source

## 2015-09-29 DIAGNOSIS — M25511 Pain in right shoulder: Secondary | ICD-10-CM | POA: Insufficient documentation

## 2015-09-29 DIAGNOSIS — I10 Essential (primary) hypertension: Secondary | ICD-10-CM | POA: Insufficient documentation

## 2015-09-29 DIAGNOSIS — Z79899 Other long term (current) drug therapy: Secondary | ICD-10-CM | POA: Insufficient documentation

## 2015-09-29 DIAGNOSIS — Z87891 Personal history of nicotine dependence: Secondary | ICD-10-CM | POA: Insufficient documentation

## 2015-09-29 DIAGNOSIS — Z8719 Personal history of other diseases of the digestive system: Secondary | ICD-10-CM | POA: Insufficient documentation

## 2015-09-29 DIAGNOSIS — Y929 Unspecified place or not applicable: Secondary | ICD-10-CM | POA: Insufficient documentation

## 2015-09-29 DIAGNOSIS — M791 Myalgia: Secondary | ICD-10-CM | POA: Insufficient documentation

## 2015-09-29 DIAGNOSIS — Y999 Unspecified external cause status: Secondary | ICD-10-CM | POA: Insufficient documentation

## 2015-09-29 DIAGNOSIS — Y9301 Activity, walking, marching and hiking: Secondary | ICD-10-CM | POA: Insufficient documentation

## 2015-09-29 DIAGNOSIS — W109XXA Fall (on) (from) unspecified stairs and steps, initial encounter: Secondary | ICD-10-CM | POA: Insufficient documentation

## 2015-09-29 LAB — CBC
HEMATOCRIT: 42.5 % (ref 39.0–52.0)
HEMOGLOBIN: 14.5 g/dL (ref 13.0–17.0)
MCH: 28.9 pg (ref 26.0–34.0)
MCHC: 34.1 g/dL (ref 30.0–36.0)
MCV: 84.8 fL (ref 78.0–100.0)
Platelets: 165 10*3/uL (ref 150–400)
RBC: 5.01 MIL/uL (ref 4.22–5.81)
RDW: 13.4 % (ref 11.5–15.5)
WBC: 8.2 10*3/uL (ref 4.0–10.5)

## 2015-09-29 LAB — BASIC METABOLIC PANEL
ANION GAP: 8 (ref 5–15)
BUN: 7 mg/dL (ref 6–20)
CALCIUM: 8.8 mg/dL — AB (ref 8.9–10.3)
CO2: 24 mmol/L (ref 22–32)
Chloride: 101 mmol/L (ref 101–111)
Creatinine, Ser: 0.93 mg/dL (ref 0.61–1.24)
GLUCOSE: 265 mg/dL — AB (ref 65–99)
POTASSIUM: 3.1 mmol/L — AB (ref 3.5–5.1)
Sodium: 133 mmol/L — ABNORMAL LOW (ref 135–145)

## 2015-09-29 LAB — I-STAT TROPONIN, ED: TROPONIN I, POC: 0.01 ng/mL (ref 0.00–0.08)

## 2015-09-29 MED ORDER — IBUPROFEN 800 MG PO TABS
800.0000 mg | ORAL_TABLET | Freq: Once | ORAL | Status: AC
Start: 2015-09-29 — End: 2015-09-29
  Administered 2015-09-29: 800 mg via ORAL
  Filled 2015-09-29: qty 1

## 2015-09-29 MED ORDER — OXYCODONE HCL 5 MG PO TABS
5.0000 mg | ORAL_TABLET | Freq: Once | ORAL | Status: AC
  Administered 2015-09-29: 5 mg via ORAL
  Filled 2015-09-29: qty 1

## 2015-09-29 MED ORDER — CLONIDINE HCL 0.3 MG PO TABS: 0.3000 mg | ORAL_TABLET | Freq: Two times a day (BID) | ORAL | 0 refills | Status: DC

## 2015-09-29 MED ORDER — ACETAMINOPHEN 500 MG PO TABS
1000.0000 mg | ORAL_TABLET | Freq: Once | ORAL | Status: AC
  Administered 2015-09-29: 1000 mg via ORAL
  Filled 2015-09-29: qty 2

## 2015-09-29 NOTE — Discharge Instructions (Signed)
Wear the sling for comfort. He needs to take your arm out of the sling and do range of motion exercises with it. Otherwise you'll get a thing called frozen shoulder. This is much worse than the condition is currently have. Take 4 over the counter ibuprofen tablets 3 times a day or 2 over-the-counter naproxen tablets twice a day for pain. Also take tylenol 1000mg (2 extra strength) four times a day.
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
## 2015-09-29 NOTE — ED Notes (Signed)
PA at bedside.
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
## 2015-10-16 ENCOUNTER — Emergency Department (HOSPITAL_COMMUNITY)
Admission: EM | Admit: 2015-10-16 | Discharge: 2015-10-16 | Disposition: A | Discharge status: Home / Self Care | Payer: No Typology Code available for payment source | Attending: Emergency Medicine | Admitting: Emergency Medicine

## 2015-10-16 ENCOUNTER — Encounter (HOSPITAL_COMMUNITY): Payer: Self-pay

## 2015-10-16 DIAGNOSIS — Z87891 Personal history of nicotine dependence: Secondary | ICD-10-CM | POA: Insufficient documentation

## 2015-10-16 DIAGNOSIS — Z791 Long term (current) use of non-steroidal anti-inflammatories (NSAID): Secondary | ICD-10-CM | POA: Insufficient documentation

## 2015-10-16 DIAGNOSIS — Z760 Encounter for issue of repeat prescription: Secondary | ICD-10-CM | POA: Insufficient documentation

## 2015-10-16 DIAGNOSIS — Z79899 Other long term (current) drug therapy: Secondary | ICD-10-CM | POA: Insufficient documentation

## 2015-10-16 DIAGNOSIS — I10 Essential (primary) hypertension: Secondary | ICD-10-CM | POA: Insufficient documentation

## 2015-10-16 MED ORDER — LISINOPRIL 20 MG PO TABS: 20.0000 mg | ORAL_TABLET | Freq: Every day | ORAL | 0 refills | Status: DC

## 2015-10-16 MED ORDER — METOPROLOL TARTRATE 25 MG PO TABS: 25.0000 mg | ORAL_TABLET | Freq: Two times a day (BID) | ORAL | 0 refills | Status: DC

## 2015-10-16 MED ORDER — CLONIDINE HCL 0.3 MG PO TABS: 0.3000 mg | ORAL_TABLET | Freq: Two times a day (BID) | ORAL | 0 refills | Status: DC

## 2015-10-16 NOTE — ED Notes (Signed)
[place] at bedside.  

## 2015-10-16 NOTE — ED Provider Notes (Signed)
[facility]-EMERGENCY DEPT Provider Note   CSN: [id number] Arrival date & time: 10/16/15  0754     History   Chief Complaint Chief Complaint  Patient presents with  . Medication Refill    HPI Travis [name] is a 50 y.o. male.  The history is provided by the patient.  Medication Refill  Patient presents for medication refill. Frequent visits for same. States he's been out of his medicines for 2-1/2 days. States he both can't wait all day for an appointment with his primary care doctor and that he has gone and he didn't call in the medication. States he checks his blood pressure at home and has been doing well about 120/75. States that he has not had chest pain. We had a long discussion on the need for primary care management of medication refills. Instructed the ER is not the place for this. No headache. No confusion. Has not had episodes of hypotension since admission around 6 weeks ago for hypotension.  Past Medical History:  Diagnosis Date  . Anxiety    Panic attacks  . Chest pain    Hospital, March, 2014, negative enzymes, patient refused in-hospital  stress test, patient canceled outpatient stress test  . Constipation   . Degenerative disk disease   . Degenerative disk disease   . Depression   . GERD (gastroesophageal reflux disease)   . Hypertension   . Incontinence of urine   . Neuromuscular disorder (HCC)   . Obesity   . Polysubstance abuse   . PTSD (post-traumatic stress disorder)   . Shortness of breath dyspnea    with exertion  . Spinal stenosis   . Spinal stenosis   . Tachycardia - pulse   . Tobacco abuse     Patient Active Problem List   Diagnosis Date Noted  . ARF (acute renal failure) (HCC) 09/01/2015  . Hypotension 09/01/2015  . Acute encephalopathy 09/01/2015  . Hematochezia 04/07/2014  . 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 Major depressive disorder, single episode, moderate (HCC) 10/12/2013  . Morbid obesity (HCC) 10/12/2013  . Essential  hypertension 10/12/2013  . Chronic pain syndrome 10/12/2013  . MDD (major depressive disorder), recurrent episode, severe (HCC) 09/29/2013  . Rectal pain 08/10/2013  . Tachycardia 07/27/2013  . Urine incontinence 07/27/2013  . Rectal bleeding 07/27/2013  . Left arm pain 03/29/2013  . Lumbar radicular pain 03/23/2013  . Bilateral shoulder pain 10/27/2012  . Smoking 10/27/2012  . Dental abscess 07/14/2012  . Hypertension   . Anxiety   . Spinal stenosis   . Tobacco abuse   . Obesity   . Dyslipidemia   . Polysubstance abuse   . Chest pain   . Grief 06/11/2012  . Panic attack 01/21/2012  . HYPERCHOLESTEROLEMIA 02/18/2006  . DEPRESSION 02/18/2006  . GERD 02/18/2006  . HEADACHE 02/18/2006    Past Surgical History:  Procedure Laterality Date  . COLONOSCOPY N/A 08/24/2014   Procedure: COLONOSCOPY;  Surgeon: Charlott Rakes, MD;  Location: [facility] ENDOSCOPY;  Service: Endoscopy;  Laterality: N/A;  . ESOPHAGOGASTRODUODENOSCOPY (EGD) WITH PROPOFOL N/A 08/24/2014   Procedure: ESOPHAGOGASTRODUODENOSCOPY (EGD) WITH PROPOFOL;  Surgeon: Charlott Rakes, MD;  Location: [facility] ENDOSCOPY;  Service: Endoscopy;  Laterality: N/A;  . WISDOM TOOTH EXTRACTION         Home Medications    Prior to Admission medications   Medication Sig Start Date End Date Taking? Authorizing Provider  albuterol (PROVENTIL HFA;VENTOLIN HFA) 108 (90 Base) MCG/ACT inhaler Inhale 2 puffs into the lungs every 6 (six) hours as needed  for wheezing or shortness of breath.    Historical Provider, MD  cloNIDine (CATAPRES) 0.3 MG tablet Take 1 tablet (0.3 mg total) by mouth 2 (two) times daily. 10/16/15   Benjiman [name] Asahel Risden, MD  cyclobenzaprine (FLEXERIL) 5 MG tablet Take 5 mg by mouth daily.    Historical Provider, MD  ibuprofen (ADVIL,MOTRIN) 800 MG tablet Take 800 mg by mouth every 8 (eight) hours as needed for moderate pain.    Historical Provider, MD  lisinopril (PRINIVIL,ZESTRIL) 20 MG tablet Take 1 tablet (20 mg total) by mouth  daily. 10/16/15   Benjiman [name] Travis Crance, MD  metoprolol tartrate (LOPRESSOR) 25 MG tablet Take 1 tablet (25 mg total) by mouth 2 (two) times daily. 10/16/15   Benjiman [name] Travis Betsill, MD  ranitidine (ZANTAC) 150 MG tablet Take 150 mg by mouth at bedtime.     Historical Provider, MD  traMADol (ULTRAM) 50 MG tablet Take 1-2 tablets by mouth every six hours as needed for pain.  Do not use >8 tabs/day. Patient taking differently: Take 50-100 mg by mouth every 6 (six) hours as needed for moderate pain. Take 1-2 tablets by mouth every six hours as needed for pain.  Do not use >8 tabs/day. 08/19/15   [name], PA-C    Family History Family History  Problem Relation Age of Onset  . Stroke Mother   . Hypertension Mother   . Hyperlipidemia Mother   . Stroke Father   . Hypertension Father   . Dementia Father   . Diabetes Father   . Heart disease Father   . Hyperlipidemia Father   . Cancer Sister     brain  . Diabetes Sister   . Heart disease Sister   . Hyperlipidemia Sister   . Hypertension Sister   . Stroke Sister   . Heart disease Brother   . Hyperlipidemia Brother     Social History Social History  Substance Use Topics  . Smoking status: Former Smoker    Packs/day: 0.02    Years: 27.00    Types: Cigarettes    Quit date: 04/05/2015  . Smokeless tobacco: Never Used     Comment: "quit April 2016, I havwe one every now and then"  . Alcohol use No     Allergies   Blueberry flavor; Penicillins; Robaxin [methocarbamol]; and Toradol [ketorolac tromethamine]   Review of Systems Review of Systems  Respiratory: Negative for shortness of breath.   Cardiovascular: Negative for chest pain.  Neurological: Negative for weakness and headaches.     Physical Exam Updated Vital Signs BP 145/85 (BP Location: Right Arm)   Pulse 68   Temp 98.4 F (36.9 C) (Oral)   Resp 18   SpO2 95%   Physical Exam  Constitutional: He appears well-developed.  Cardiovascular: Normal rate.     Pulmonary/Chest: Effort normal.  Skin: Skin is warm.     ED Treatments / Results  Labs (all labs ordered are listed, but only abnormal results are displayed) Labs Reviewed - No data to display  EKG  EKG Interpretation None       Radiology No results found.  Procedures Procedures (including critical care time)  Medications Ordered in ED Medications - No data to display   Initial Impression / Assessment and Plan / ED Course  I have reviewed the triage vital signs and the nursing notes.  Pertinent labs & imaging results that were available during my care of the patient were reviewed by me and considered in my medical decision making (see chart  for details).  Clinical Course    Patient presents for medication refill. History of same. Instructed on need for primary care follow-up with the ER is not the place for the refills. Given a one-week supply. Does not appear to have worry for end organ damage. Discharge.  Final Clinical Impressions(s) / ED Diagnoses   Final diagnoses:  Medication refill    New Prescriptions Current Discharge Medication List       Benjiman [name] Jael Kostick, MD 10/16/15 [phone]

## 2015-10-16 NOTE — ED Triage Notes (Addendum)
Pt presents needing a medication refill.  Sts he ran out of his HTN medication "2.5 days ago."  Pt reports that he has an appointment w/ PCP in "2.5 weeks."  Sts "they keep telling me that I need to be seen.  I just saw them last week, but they didn't call it in.  I can't go up there and stand."  Pt reports "I found some lisinopril that I didn't know I had, but I need the clonidine."

## 2015-11-13 ENCOUNTER — Encounter (HOSPITAL_COMMUNITY): Payer: Self-pay | Admitting: *Deleted

## 2015-11-13 ENCOUNTER — Emergency Department (HOSPITAL_COMMUNITY)
Admission: EM | Admit: 2015-11-13 | Discharge: 2015-11-13 | Disposition: A | Discharge status: Home / Self Care | Payer: No Typology Code available for payment source | Attending: Emergency Medicine | Admitting: Emergency Medicine

## 2015-11-13 DIAGNOSIS — Z791 Long term (current) use of non-steroidal anti-inflammatories (NSAID): Secondary | ICD-10-CM | POA: Insufficient documentation

## 2015-11-13 DIAGNOSIS — Z760 Encounter for issue of repeat prescription: Secondary | ICD-10-CM

## 2015-11-13 DIAGNOSIS — Z79899 Other long term (current) drug therapy: Secondary | ICD-10-CM | POA: Insufficient documentation

## 2015-11-13 DIAGNOSIS — Z87891 Personal history of nicotine dependence: Secondary | ICD-10-CM | POA: Insufficient documentation

## 2015-11-13 DIAGNOSIS — I10 Essential (primary) hypertension: Secondary | ICD-10-CM | POA: Insufficient documentation

## 2015-11-13 DIAGNOSIS — M5440 Lumbago with sciatica, unspecified side: Secondary | ICD-10-CM

## 2015-11-13 MED ORDER — LISINOPRIL 20 MG PO TABS: ORAL_TABLET | ORAL | 0 refills | Status: DC

## 2015-11-13 MED ORDER — METOPROLOL TARTRATE 25 MG PO TABS: 25.0000 mg | ORAL_TABLET | Freq: Two times a day (BID) | ORAL | 0 refills | Status: DC

## 2015-11-13 MED ORDER — CLONIDINE HCL 0.3 MG PO TABS: 0.3000 mg | ORAL_TABLET | Freq: Two times a day (BID) | ORAL | 0 refills | Status: DC

## 2015-11-13 MED ORDER — DEXAMETHASONE SODIUM PHOSPHATE 10 MG/ML IJ SOLN
10.0000 mg | Freq: Once | INTRAMUSCULAR | Status: AC
  Administered 2015-11-13: 10 mg via INTRAMUSCULAR
  Filled 2015-11-13: qty 1

## 2015-11-13 MED ORDER — LIDOCAINE 5 % EX PTCH
1.0000 | MEDICATED_PATCH | CUTANEOUS | Status: DC
  Administered 2015-11-13: 1 via TRANSDERMAL
  Filled 2015-11-13: qty 1

## 2015-11-13 NOTE — ED Provider Notes (Signed)
[facility]-EMERGENCY DEPT Provider Note   CSN: [id number] Arrival date & time: 11/13/15  2046 By signing my name below, I, Levon HedgerElizabeth Hall, attest that this documentation has been prepared under the direction and in the presence of Othell Diluzio, MD . Electronically Signed: Levon HedgerElizabeth Hall, Scribe. 11/13/2015. 11:24 PM.   History   Chief Complaint Chief Complaint  Patient presents with  . Back Pain  . Hypertension    HPI Travis [name] is a 50 y.o. male with hx of degenerative disk disease, PTSD, and HTN who presents to the Emergency Department complaining of sudden onset lower back pain s/p fall today. Pt states he stumbled and is now experiencing severe pain. He has applied heat and taken tramadol with no relief. No modifying factors noted. Pt is also out of his clonidine and is requesting his refill. He states he woke up this morning to find that his clonidine and "most" of his medication was gone. Pt has no other complaints at this time.  The history is provided by the patient. No language interpreter was used.  Back Pain   This is a chronic problem. The current episode started more than 1 week ago. The problem occurs constantly. The pain is associated with no known injury. The pain is present in the lumbar spine. The pain does not radiate. The pain is moderate. The pain is the same all the time. Pertinent negatives include no chest pain, no fever, no numbness, no weight loss, no headaches, no abdominal pain, no abdominal swelling, no bowel incontinence, no perianal numbness, no bladder incontinence, no dysuria, no pelvic pain, no leg pain, no paresthesias, no paresis, no tingling and no weakness. The treatment provided no relief. Risk factors include obesity.    Past Medical History:  Diagnosis Date  . Anxiety    Panic attacks  . Chest pain    Hospital, March, 2014, negative enzymes, patient refused in-hospital  stress test, patient canceled outpatient stress test  . Constipation   .  Degenerative disk disease   . Degenerative disk disease   . Depression   . GERD (gastroesophageal reflux disease)   . Hypertension   . Incontinence of urine   . Neuromuscular disorder (HCC)   . Obesity   . Polysubstance abuse   . PTSD (post-traumatic stress disorder)   . Shortness of breath dyspnea    with exertion  . Spinal stenosis   . Spinal stenosis   . Tachycardia - pulse   . Tobacco abuse     Patient Active Problem List   Diagnosis Date Noted  . ARF (acute renal failure) (HCC) 09/01/2015  . Hypotension 09/01/2015  . Acute encephalopathy 09/01/2015  . Hematochezia 04/07/2014  . 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 Major depressive disorder, single episode, moderate (HCC) 10/12/2013  . Morbid obesity (HCC) 10/12/2013  . Essential hypertension 10/12/2013  . Chronic pain syndrome 10/12/2013  . MDD (major depressive disorder), recurrent episode, severe (HCC) 09/29/2013  . Rectal pain 08/10/2013  . Tachycardia 07/27/2013  . Urine incontinence 07/27/2013  . Rectal bleeding 07/27/2013  . Left arm pain 03/29/2013  . Lumbar radicular pain 03/23/2013  . Bilateral shoulder pain 10/27/2012  . Smoking 10/27/2012  . Dental abscess 07/14/2012  . Hypertension   . Anxiety   . Spinal stenosis   . Tobacco abuse   . Obesity   . Dyslipidemia   . Polysubstance abuse   . Chest pain   . Grief 06/11/2012  . Panic attack 01/21/2012  . HYPERCHOLESTEROLEMIA 02/18/2006  . DEPRESSION  02/18/2006  . GERD 02/18/2006  . HEADACHE 02/18/2006    Past Surgical History:  Procedure Laterality Date  . COLONOSCOPY N/A 08/24/2014   Procedure: COLONOSCOPY;  Surgeon: Charlott Rakes, MD;  Location: [facility] ENDOSCOPY;  Service: Endoscopy;  Laterality: N/A;  . ESOPHAGOGASTRODUODENOSCOPY (EGD) WITH PROPOFOL N/A 08/24/2014   Procedure: ESOPHAGOGASTRODUODENOSCOPY (EGD) WITH PROPOFOL;  Surgeon: Charlott Rakes, MD;  Location: [facility] ENDOSCOPY;  Service: Endoscopy;  Laterality: N/A;  . WISDOM TOOTH  EXTRACTION         Home Medications    Prior to Admission medications   Medication Sig Start Date End Date Taking? Authorizing Provider  albuterol (PROVENTIL HFA;VENTOLIN HFA) 108 (90 Base) MCG/ACT inhaler Inhale 2 puffs into the lungs every 6 (six) hours as needed for wheezing or shortness of breath.    Historical Provider, MD  cloNIDine (CATAPRES) 0.3 MG tablet Take 1 tablet (0.3 mg total) by mouth 2 (two) times daily. 10/16/15   Benjiman Core, MD  cyclobenzaprine (FLEXERIL) 5 MG tablet Take 5 mg by mouth daily.    Historical Provider, MD  ibuprofen (ADVIL,MOTRIN) 800 MG tablet Take 800 mg by mouth every 8 (eight) hours as needed for moderate pain.    Historical Provider, MD  lisinopril (PRINIVIL,ZESTRIL) 20 MG tablet Take 1 tablet (20 mg total) by mouth daily. 10/16/15   Benjiman Core, MD  metoprolol tartrate (LOPRESSOR) 25 MG tablet Take 1 tablet (25 mg total) by mouth 2 (two) times daily. 10/16/15   Benjiman Core, MD  ranitidine (ZANTAC) 150 MG tablet Take 150 mg by mouth at bedtime.     Historical Provider, MD  traMADol (ULTRAM) 50 MG tablet Take 1-2 tablets by mouth every six hours as needed for pain.  Do not use >8 tabs/day. Patient taking differently: Take 50-100 mg by mouth every 6 (six) hours as needed for moderate pain. Take 1-2 tablets by mouth every six hours as needed for pain.  Do not use >8 tabs/day. 08/19/15   Melton Krebs, PA-C    Family History Family History  Problem Relation Age of Onset  . Stroke Mother   . Hypertension Mother   . Hyperlipidemia Mother   . Stroke Father   . Hypertension Father   . Dementia Father   . Diabetes Father   . Heart disease Father   . Hyperlipidemia Father   . Cancer Sister     brain  . Diabetes Sister   . Heart disease Sister   . Hyperlipidemia Sister   . Hypertension Sister   . Stroke Sister   . Heart disease Brother   . Hyperlipidemia Brother     Social History Social History  Substance Use Topics  .  Smoking status: Former Smoker    Packs/day: 0.02    Years: 27.00    Types: Cigarettes    Quit date: 04/05/2015  . Smokeless tobacco: Never Used     Comment: "quit Akiah Bauch 2016, I havwe one every now and then"  . Alcohol use No     Allergies   Blueberry flavor; Penicillins; Robaxin [methocarbamol]; and Toradol [ketorolac tromethamine]   Review of Systems Review of Systems  Constitutional: Negative for fever and weight loss.  Cardiovascular: Negative for chest pain.  Gastrointestinal: Negative for abdominal pain and bowel incontinence.  Genitourinary: Negative for bladder incontinence, dysuria and pelvic pain.  Musculoskeletal: Positive for back pain. Negative for gait problem.  Neurological: Negative for dizziness, tingling, weakness, numbness, headaches and paresthesias.  All other systems reviewed and are negative.  Physical Exam  Updated Vital Signs BP (!) 157/101 (BP Location: Left Arm)   Pulse 70   Temp 98.6 F (37 C) (Oral)   Resp 18   SpO2 97%   Physical Exam  Constitutional: He is oriented to person, place, and time. He appears well-developed and well-nourished. No distress.  HENT:  Head: Normocephalic and atraumatic.  Mouth/Throat: Oropharynx is clear and moist. No oropharyngeal exudate.  Moist mucous membranes   Eyes: Conjunctivae are normal. Pupils are equal, round, and reactive to light.  Neck: Normal range of motion. Neck supple. No JVD present.  Trachea midline No bruit  Cardiovascular: Normal rate, regular rhythm, normal heart sounds and intact distal pulses.   Pulmonary/Chest: Effort normal and breath sounds normal. No stridor. No respiratory distress. He has no wheezes.  Abdominal: Soft. Bowel sounds are normal. He exhibits no distension. There is no rebound and no guarding.  Musculoskeletal: Normal range of motion.  Neurological: He is alert and oriented to person, place, and time. He has normal reflexes. He displays normal reflexes.  Skin: Skin is warm  and dry. Capillary refill takes less than 2 seconds.  Psychiatric: He has a normal mood and affect. His behavior is normal.  Nursing note and vitals reviewed.    ED Treatments / Results  DIAGNOSTIC STUDIES:  Oxygen Saturation is 97% on RA, normal by my interpretation.    COORDINATION OF CARE:  11:09 PM Discussed treatment plan with pt at bedside and pt agreed to plan.  Labs (all labs ordered are listed, but only abnormal results are displayed) Labs Reviewed - No data to display  Vitals:   11/13/15 2059  BP: (!) 157/101  Pulse: 70  Resp: 18  Temp: 98.6 F (37 C)   Medications  lidocaine (LIDODERM) 5 % 1 patch (not administered)  dexamethasone (DECADRON) injection 10 mg (not administered)     Radiology No results found.  Procedures Procedures (including critical care time)  Medications Ordered in ED Medications - No data to display   Initial Impression / Assessment and Plan / ED Course  I have reviewed the triage vital signs and the nursing notes.  Pertinent labs & imaging results that were available during my care of the patient were reviewed by me and considered in my medical decision making (see chart for details).   Final Clinical Impressions(s) / ED Diagnoses   Will refill patient's meds until his appointment.  No narcotics in the ED as pain is chronic.    New Prescriptions New Prescriptions   No medications on file   All questions answered to patient's satisfaction. Based on history and exam patient has been appropriately medically screened and emergency conditions excluded. Patient is stable for discharge at this time. Follow up with your PMD for recheck in 2 days and strict return precautions given   Mattie Nordell, MD 11/13/15 2329

## 2015-11-13 NOTE — ED Triage Notes (Signed)
Pt here due to concern home blood pressure reading 176/156 and back pain due to a fall today. Pt states he is out of clonidine and would a refill.

## 2015-11-25 ENCOUNTER — Emergency Department (HOSPITAL_COMMUNITY): Payer: Self-pay

## 2015-11-25 ENCOUNTER — Encounter (HOSPITAL_COMMUNITY): Payer: Self-pay

## 2015-11-25 ENCOUNTER — Emergency Department (HOSPITAL_COMMUNITY)
Admission: EM | Admit: 2015-11-25 | Discharge: 2015-11-25 | Disposition: A | Discharge status: Home / Self Care | Payer: Self-pay | Attending: Emergency Medicine | Admitting: Emergency Medicine

## 2015-11-25 DIAGNOSIS — R079 Chest pain, unspecified: Secondary | ICD-10-CM | POA: Insufficient documentation

## 2015-11-25 DIAGNOSIS — IMO0001 Reserved for inherently not codable concepts without codable children: Secondary | ICD-10-CM

## 2015-11-25 DIAGNOSIS — R030 Elevated blood-pressure reading, without diagnosis of hypertension: Secondary | ICD-10-CM

## 2015-11-25 DIAGNOSIS — I10 Essential (primary) hypertension: Secondary | ICD-10-CM | POA: Insufficient documentation

## 2015-11-25 DIAGNOSIS — Z87891 Personal history of nicotine dependence: Secondary | ICD-10-CM | POA: Insufficient documentation

## 2015-11-25 LAB — CBC
HCT: 46 % (ref 39.0–52.0)
HEMOGLOBIN: 15.1 g/dL (ref 13.0–17.0)
MCH: 29 pg (ref 26.0–34.0)
MCHC: 32.8 g/dL (ref 30.0–36.0)
MCV: 88.3 fL (ref 78.0–100.0)
PLATELETS: 137 10*3/uL — AB (ref 150–400)
RBC: 5.21 MIL/uL (ref 4.22–5.81)
RDW: 13.6 % (ref 11.5–15.5)
WBC: 6.7 10*3/uL (ref 4.0–10.5)

## 2015-11-25 LAB — BASIC METABOLIC PANEL
ANION GAP: 12 (ref 5–15)
BUN: 5 mg/dL — ABNORMAL LOW (ref 6–20)
CALCIUM: 9 mg/dL (ref 8.9–10.3)
CO2: 28 mmol/L (ref 22–32)
Chloride: 98 mmol/L — ABNORMAL LOW (ref 101–111)
Creatinine, Ser: 0.84 mg/dL (ref 0.61–1.24)
Glucose, Bld: 153 mg/dL — ABNORMAL HIGH (ref 65–99)
Potassium: 3.2 mmol/L — ABNORMAL LOW (ref 3.5–5.1)
SODIUM: 138 mmol/L (ref 135–145)

## 2015-11-25 LAB — I-STAT TROPONIN, ED: TROPONIN I, POC: 0 ng/mL (ref 0.00–0.08)

## 2015-11-25 LAB — TROPONIN I: Troponin I: <0.03 ng/mL (ref <0.03)

## 2015-11-25 LAB — D-DIMER, QUANTITATIVE: D-Dimer, Quant: 0.36 ug/mL-FEU (ref 0.00–0.50)

## 2015-11-25 MED ORDER — LISINOPRIL 20 MG PO TABS: ORAL_TABLET | ORAL | 0 refills | Status: DC

## 2015-11-25 MED ORDER — METOPROLOL TARTRATE 25 MG PO TABS: 25.0000 mg | ORAL_TABLET | Freq: Two times a day (BID) | ORAL | 0 refills | Status: DC

## 2015-11-25 MED ORDER — LISINOPRIL 20 MG PO TABS
20.0000 mg | ORAL_TABLET | Freq: Every day | ORAL | Status: DC
  Administered 2015-11-25: 20 mg via ORAL
  Filled 2015-11-25: qty 1

## 2015-11-25 MED ORDER — METOPROLOL TARTRATE 25 MG PO TABS
25.0000 mg | ORAL_TABLET | Freq: Two times a day (BID) | ORAL | Status: DC
  Administered 2015-11-25: 25 mg via ORAL
  Filled 2015-11-25: qty 1

## 2015-11-25 MED ORDER — CLONIDINE HCL 0.3 MG PO TABS: 0.3000 mg | ORAL_TABLET | Freq: Two times a day (BID) | ORAL | 0 refills | Status: DC

## 2015-11-25 NOTE — ED Notes (Signed)
MD at bedside. 

## 2015-11-25 NOTE — ED Notes (Signed)
Pt taken to xray at this time.

## 2015-11-25 NOTE — ED Provider Notes (Signed)
[facility]-EMERGENCY DEPT Provider Note   CSN: [id number] Arrival date & time: 11/25/15  0746     History   Chief Complaint Chief Complaint  Patient presents with  . Chest Pain    HPI Travis [name] is a 50 y.o. male.  Patient complains of intermittent central dull chest pain since last night lasting 5-6 minutes at a time. This pain does not radiate. It stays in the center of his chest nothing makes it better or worse. Denies any shortness of breath, nausea, vomiting, diaphoresis, syncope. Patient denies any cardiac history states he had a stress test several weeks ago but believes it was negative. He states his he is having chest pain because he has not been taking his blood pressure medications yesterday. He did find a clonodine this morning but did not take his lisinopril or metoprolol. Last dose of his medications was yesterday. Patient presents to the ED frequently for medication refills. Notably he was given prescriptions for all of his blood pressure medication on September 11 but he does not know what happened to this prescription. Denies any focal weakness, numbness or tingling. Denies any bowel or bladder incontinence.    Chest Pain   Pertinent negatives include no abdominal pain, no dizziness, no fever, no headaches, no nausea, no shortness of breath, no vomiting and no weakness.    Past Medical History:  Diagnosis Date  . Anxiety    Panic attacks  . Chest pain    Hospital, March, 2014, negative enzymes, patient refused in-hospital  stress test, patient canceled outpatient stress test  . Constipation   . Degenerative disk disease   . Degenerative disk disease   . Depression   . GERD (gastroesophageal reflux disease)   . Hypertension   . Incontinence of urine   . Neuromuscular disorder (HCC)   . Obesity   . Polysubstance abuse   . PTSD (post-traumatic stress disorder)   . Shortness of breath dyspnea    with exertion  . Spinal stenosis   . Spinal stenosis   .  Tachycardia - pulse   . Tobacco abuse     Patient Active Problem List   Diagnosis Date Noted  . ARF (acute renal failure) (HCC) 09/01/2015  . Hypotension 09/01/2015  . Acute encephalopathy 09/01/2015  . Hematochezia 04/07/2014  . 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 Major depressive disorder, single episode, moderate (HCC) 10/12/2013  . Morbid obesity (HCC) 10/12/2013  . Essential hypertension 10/12/2013  . Chronic pain syndrome 10/12/2013  . MDD (major depressive disorder), recurrent episode, severe (HCC) 09/29/2013  . Rectal pain 08/10/2013  . Tachycardia 07/27/2013  . Urine incontinence 07/27/2013  . Rectal bleeding 07/27/2013  . Left arm pain 03/29/2013  . Lumbar radicular pain 03/23/2013  . Bilateral shoulder pain 10/27/2012  . Smoking 10/27/2012  . Dental abscess 07/14/2012  . Hypertension   . Anxiety   . Spinal stenosis   . Tobacco abuse   . Obesity   . Dyslipidemia   . Polysubstance abuse   . Chest pain   . Grief 06/11/2012  . Panic attack 01/21/2012  . HYPERCHOLESTEROLEMIA 02/18/2006  . DEPRESSION 02/18/2006  . GERD 02/18/2006  . HEADACHE 02/18/2006    Past Surgical History:  Procedure Laterality Date  . COLONOSCOPY N/A 08/24/2014   Procedure: COLONOSCOPY;  Surgeon: Charlott Rakes, MD;  Location: [facility] ENDOSCOPY;  Service: Endoscopy;  Laterality: N/A;  . ESOPHAGOGASTRODUODENOSCOPY (EGD) WITH PROPOFOL N/A 08/24/2014   Procedure: ESOPHAGOGASTRODUODENOSCOPY (EGD) WITH PROPOFOL;  Surgeon: Charlott Rakes, MD;  Location: [facility]  ENDOSCOPY;  Service: Endoscopy;  Laterality: N/A;  . WISDOM TOOTH EXTRACTION         Home Medications    Prior to Admission medications   Medication Sig Start Date End Date Taking? Authorizing Provider  albuterol (PROVENTIL HFA;VENTOLIN HFA) 108 (90 Base) MCG/ACT inhaler Inhale 2 puffs into the lungs every 6 (six) hours as needed for wheezing or shortness of breath.    Historical Provider, MD  cloNIDine (CATAPRES) 0.3 MG tablet Take 1  tablet (0.3 mg total) by mouth 2 (two) times daily. 11/13/15   April Palumbo, MD  cyclobenzaprine (FLEXERIL) 5 MG tablet Take 5 mg by mouth daily.    Historical Provider, MD  ibuprofen (ADVIL,MOTRIN) 800 MG tablet Take 800 mg by mouth every 8 (eight) hours as needed for moderate pain.    Historical Provider, MD  lisinopril (PRINIVIL,ZESTRIL) 20 MG tablet One tab daily 11/13/15   April Palumbo, MD  metoprolol (LOPRESSOR) 25 MG tablet Take 1 tablet (25 mg total) by mouth 2 (two) times daily. 11/13/15   April Palumbo, MD  ranitidine (ZANTAC) 150 MG tablet Take 150 mg by mouth at bedtime.     Historical Provider, MD  traMADol (ULTRAM) 50 MG tablet Take 1-2 tablets by mouth every six hours as needed for pain.  Do not use >8 tabs/day. Patient taking differently: Take 50-100 mg by mouth every 6 (six) hours as needed for moderate pain. Take 1-2 tablets by mouth every six hours as needed for pain.  Do not use >8 tabs/day. 08/19/15   Melton Krebs, PA-C    Family History Family History  Problem Relation Age of Onset  . Stroke Mother   . Hypertension Mother   . Hyperlipidemia Mother   . Stroke Father   . Hypertension Father   . Dementia Father   . Diabetes Father   . Heart disease Father   . Hyperlipidemia Father   . Cancer Sister     brain  . Diabetes Sister   . Heart disease Sister   . Hyperlipidemia Sister   . Hypertension Sister   . Stroke Sister   . Heart disease Brother   . Hyperlipidemia Brother     Social History Social History  Substance Use Topics  . Smoking status: Former Smoker    Packs/day: 0.02    Years: 27.00    Types: Cigarettes    Quit date: 04/05/2015  . Smokeless tobacco: Never Used     Comment: "quit April 2016, I havwe one every now and then"  . Alcohol use No     Allergies   Blueberry flavor; Penicillins; Robaxin [methocarbamol]; and Toradol [ketorolac tromethamine]   Review of Systems Review of Systems  Constitutional: Negative for activity change,  appetite change, fatigue and fever.  HENT: Negative for congestion and rhinorrhea.   Respiratory: Positive for chest tightness. Negative for shortness of breath.   Cardiovascular: Positive for chest pain.  Gastrointestinal: Negative for abdominal pain, nausea and vomiting.  Genitourinary: Negative for dysuria, hematuria and urgency.  Musculoskeletal: Negative for arthralgias and myalgias.  Skin: Negative for rash.  Neurological: Negative for dizziness, weakness, light-headedness and headaches.   A complete 10 system review of systems was obtained and all systems are negative except as noted in the HPI and PMH.    Physical Exam Updated Vital Signs BP 137/94   Pulse 106   Temp 98.7 F (37.1 C) (Oral)   Resp 16   SpO2 98%   Physical Exam  Constitutional: He is oriented  to person, place, and time. He appears well-developed and well-nourished. No distress.  HENT:  Head: Normocephalic and atraumatic.  Mouth/Throat: Oropharynx is clear and moist. No oropharyngeal exudate.  Eyes: Conjunctivae and EOM are normal. Pupils are equal, round, and reactive to light.  Neck: Normal range of motion. Neck supple.  No meningismus.  Cardiovascular: Normal rate, regular rhythm, normal heart sounds and intact distal pulses.   No murmur heard. Tachycardic 100s  Pulmonary/Chest: Effort normal and breath sounds normal. No respiratory distress. He exhibits no tenderness.  Abdominal: Soft. There is no tenderness. There is no rebound and no guarding.  Musculoskeletal: Normal range of motion. He exhibits no edema or tenderness.  Neurological: He is alert and oriented to person, place, and time. No cranial nerve deficit. He exhibits normal muscle tone. Coordination normal.  No ataxia on finger to nose bilaterally. No pronator drift. 5/5 strength throughout. CN 2-12 intact.Equal grip strength. Sensation intact.   Skin: Skin is warm.  Psychiatric: He has a normal mood and affect. His behavior is normal.    Nursing note and vitals reviewed.    ED Treatments / Results  Labs (all labs ordered are listed, but only abnormal results are displayed) Labs Reviewed  BASIC METABOLIC PANEL - Abnormal; Notable for the following:       Result Value   Potassium 3.2 (*)    Chloride 98 (*)    Glucose, Bld 153 (*)    BUN 5 (*)    All other components within normal limits  CBC - Abnormal; Notable for the following:    Platelets 137 (*)    All other components within normal limits  D-DIMER, QUANTITATIVE (NOT AT [facility])  TROPONIN I  Rosezena Sensor, ED    EKG  EKG Interpretation  Date/Time:  Saturday November 25 2015 07:53:37 EDT Ventricular Rate:  112 PR Interval:  176 QRS Duration: 88 QT Interval:  338 QTC Calculation: 461 R Axis:   -26 Text Interpretation:  Sinus tachycardia Minimal voltage criteria for LVH, may be normal variant Inferior infarct , age undetermined Abnormal ECG No significant change was found Confirmed by Manus Gunning  MD, Makinze Jani [phone]) on 11/25/2015 8:06:26 AM       Radiology Dg Chest 2 View  Result Date: 11/25/2015 CLINICAL DATA:  Chest pain.  Hypertension.  Ex-smoker. EXAM: CHEST  2 VIEW COMPARISON:  09/29/2015 FINDINGS: Lateral view degraded by patient arm position. Mild right hemidiaphragm elevation. Midline trachea. Borderline cardiomegaly. Mediastinal contours otherwise within normal limits. No pleural effusion or pneumothorax. Subsegmental atelectasis or scarring involves the left lung base laterally. IMPRESSION: No acute cardiopulmonary disease. Electronically Signed   By: Jeronimo Greaves M.D.   On: 11/25/2015 09:37    Procedures Procedures (including critical care time)  Medications Ordered in ED Medications  lisinopril (PRINIVIL,ZESTRIL) tablet 20 mg (not administered)  metoprolol tartrate (LOPRESSOR) tablet 25 mg (not administered)     Initial Impression / Assessment and Plan / ED Course  I have reviewed the triage vital signs and the nursing  notes.  Pertinent labs & imaging results that were available during my care of the patient were reviewed by me and considered in my medical decision making (see chart for details).  Clinical Course  Patient with intermittent central chest pain since last night lasting a few minutes at a time . NO chest pain currently. States has not had his blood pressure medication since yesterday. EKG is unchanged with inferior Q waves.  Patient claims that he had a stress test within  the past year in the cardiology office. I do not see any record of this. The only notation available Epic is that he refused stress test in 2014  EKG unchanged. Troponin negative x2.  Low suspicion for ACS, PE, aortic dissection. D-dimer negative. BP well controlled in the ED. Home meds given. PRescriptions given.  Patient to followup with cardiology for stress test. Heart score 3. Patient informed he needs a PCP for medication refills.  Final Clinical Impressions(s) / ED Diagnoses   Final diagnoses:  Nonspecific chest pain  Elevated blood pressure    New Prescriptions New Prescriptions   No medications on file     Glynn OctaveStephen Yuriel Lopezmartinez, MD 11/25/15 1755

## 2015-11-25 NOTE — ED Triage Notes (Signed)
Patient complains of central chest pain since last pm, intermittent and dull pain. Has been out of his BP meds the past few days, NAD

## 2015-11-25 NOTE — Discharge Instructions (Signed)
Take your blood pressure medications as prescribed and followup with the cardiologist for a stress test. Return to the ED if you develop new or worsening symptoms.

## 2015-11-26 ENCOUNTER — Emergency Department (HOSPITAL_COMMUNITY): Payer: No Typology Code available for payment source

## 2015-11-26 ENCOUNTER — Encounter (HOSPITAL_COMMUNITY): Payer: Self-pay | Admitting: Emergency Medicine

## 2015-11-26 ENCOUNTER — Emergency Department (HOSPITAL_COMMUNITY)
Admission: EM | Admit: 2015-11-26 | Discharge: 2015-11-26 | Disposition: A | Discharge status: Home / Self Care | Payer: No Typology Code available for payment source | Attending: Emergency Medicine | Admitting: Emergency Medicine

## 2015-11-26 DIAGNOSIS — W19XXXA Unspecified fall, initial encounter: Secondary | ICD-10-CM

## 2015-11-26 DIAGNOSIS — Y999 Unspecified external cause status: Secondary | ICD-10-CM | POA: Insufficient documentation

## 2015-11-26 DIAGNOSIS — M25531 Pain in right wrist: Secondary | ICD-10-CM | POA: Insufficient documentation

## 2015-11-26 DIAGNOSIS — Y939 Activity, unspecified: Secondary | ICD-10-CM | POA: Insufficient documentation

## 2015-11-26 DIAGNOSIS — W109XXA Fall (on) (from) unspecified stairs and steps, initial encounter: Secondary | ICD-10-CM | POA: Insufficient documentation

## 2015-11-26 DIAGNOSIS — Z87891 Personal history of nicotine dependence: Secondary | ICD-10-CM | POA: Insufficient documentation

## 2015-11-26 DIAGNOSIS — M25532 Pain in left wrist: Secondary | ICD-10-CM | POA: Insufficient documentation

## 2015-11-26 DIAGNOSIS — Y92009 Unspecified place in unspecified non-institutional (private) residence as the place of occurrence of the external cause: Secondary | ICD-10-CM | POA: Insufficient documentation

## 2015-11-26 DIAGNOSIS — R0781 Pleurodynia: Secondary | ICD-10-CM | POA: Insufficient documentation

## 2015-11-26 DIAGNOSIS — I10 Essential (primary) hypertension: Secondary | ICD-10-CM | POA: Insufficient documentation

## 2015-11-26 MED ORDER — TRAMADOL HCL 50 MG PO TABS: 50.0000 mg | ORAL_TABLET | Freq: Once | ORAL | Status: DC

## 2015-11-26 MED ORDER — TRAMADOL HCL 50 MG PO TABS: 50.0000 mg | ORAL_TABLET | Freq: Four times a day (QID) | ORAL | 0 refills | Status: DC | PRN

## 2015-11-26 NOTE — ED Notes (Signed)
Patient transported to X-ray 

## 2015-11-26 NOTE — ED Notes (Addendum)
Patient is alert and orientedx4.  Patient was explained discharge instructions and they understood them with no questions.  The patient's brother, Travis [name] is taking him home.

## 2015-11-26 NOTE — ED Triage Notes (Signed)
He was stepping off the sidewalk and he fell forward.  The fall was unwitneseed, he denies LOC and is complaining of bilateral wrists pain.  The patient rates his pain 10/10.  He is also complaining of right rib pain.  EMS advised stroke assessment was negative, but he was "groggy"when they got to him.  He denies alcohol.  He also denies any durg use.

## 2015-11-26 NOTE — Progress Notes (Signed)
Orthopedic Tech Progress Note Patient Details:  Travis [name] [birth year] [id number]  Ortho Devices Type of Ortho Device: Velcro wrist splint Ortho Device/Splint Location: bi velcro wrist splint Ortho Device/Splint Interventions: Ordered, Application   [name], [name] 11/26/2015, 10:32 PM

## 2015-11-26 NOTE — Discharge Instructions (Signed)
Use ice or heat on your wrists and side

## 2015-11-27 NOTE — ED Provider Notes (Signed)
[facility]-EMERGENCY DEPT Provider Note   CSN: [id number] Arrival date & time: 11/26/15  1929     History   Chief Complaint Chief Complaint  Patient presents with  . Fall    He was stepping off the sidewalk and he fell forward.  The fall was unwitneseed, he denies LOC and is complaining of bilateral wrists pain.  The patient rates his pain 10/10.  He is also complaining of right rib pain.  EMS advised stroke assessment was negative, but he was "groggy"when they got to him.  He denies alcohol.  He also denies any durg use.      HPI Travis [name] is a 50 y.o. male who presents with pain after a fall earlier this evening. He was seen earlier today for chest pain which he states has not gotten any better. He went home after being seen and states he was going up the stairs to his home which are rather high and tripped and fell forward. He fell with his hands outstretched but still hit his face. He reports pain of the bilateral wrists and right side of his rib cage. He states he has injured his wrists in the past as well. He denies LOC, lightheadedness/dizziness, headache, vision changes, SOB, abdominal pain, N/V, difficulty walking. Again he states he has chest pain however his chest pain work up several hours ago was reassuring. He states that he normally takes Tramadol for pain but it "hasn't touched it" however when asked if he has any Tramadol at home he says he is out.  HPI  Past Medical History:  Diagnosis Date  . Anxiety    Panic attacks  . Chest pain    Hospital, March, 2014, negative enzymes, patient refused in-hospital  stress test, patient canceled outpatient stress test  . Constipation   . Degenerative disk disease   . Degenerative disk disease   . Depression   . GERD (gastroesophageal reflux disease)   . Hypertension   . Incontinence of urine   . Neuromuscular disorder (HCC)   . Obesity   . Polysubstance abuse   . PTSD (post-traumatic stress disorder)   . Shortness of breath  dyspnea    with exertion  . Spinal stenosis   . Spinal stenosis   . Tachycardia - pulse   . Tobacco abuse     Patient Active Problem List   Diagnosis Date Noted  . ARF (acute renal failure) (HCC) 09/01/2015  . Hypotension 09/01/2015  . Acute encephalopathy 09/01/2015  . Hematochezia 04/07/2014  . 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 Major depressive disorder, single episode, moderate (HCC) 10/12/2013  . Morbid obesity (HCC) 10/12/2013  . Essential hypertension 10/12/2013  . Chronic pain syndrome 10/12/2013  . MDD (major depressive disorder), recurrent episode, severe (HCC) 09/29/2013  . Rectal pain 08/10/2013  . Tachycardia 07/27/2013  . Urine incontinence 07/27/2013  . Rectal bleeding 07/27/2013  . Left arm pain 03/29/2013  . Lumbar radicular pain 03/23/2013  . Bilateral shoulder pain 10/27/2012  . Smoking 10/27/2012  . Dental abscess 07/14/2012  . Hypertension   . Anxiety   . Spinal stenosis   . Tobacco abuse   . Obesity   . Dyslipidemia   . Polysubstance abuse   . Chest pain   . Grief 06/11/2012  . Panic attack 01/21/2012  . HYPERCHOLESTEROLEMIA 02/18/2006  . DEPRESSION 02/18/2006  . GERD 02/18/2006  . HEADACHE 02/18/2006    Past Surgical History:  Procedure Laterality Date  . COLONOSCOPY N/A 08/24/2014   Procedure: COLONOSCOPY;  Surgeon: Charlott Rakes, MD;  Location: [facility] ENDOSCOPY;  Service: Endoscopy;  Laterality: N/A;  . ESOPHAGOGASTRODUODENOSCOPY (EGD) WITH PROPOFOL N/A 08/24/2014   Procedure: ESOPHAGOGASTRODUODENOSCOPY (EGD) WITH PROPOFOL;  Surgeon: Charlott Rakes, MD;  Location: [facility] ENDOSCOPY;  Service: Endoscopy;  Laterality: N/A;  . WISDOM TOOTH EXTRACTION         Home Medications    Prior to Admission medications   Medication Sig Start Date End Date Taking? Authorizing Provider  cloNIDine (CATAPRES) 0.3 MG tablet Take 1 tablet (0.3 mg total) by mouth 2 (two) times daily. 11/25/15  Yes Glynn Octave, MD  ibuprofen (ADVIL,MOTRIN) 200 MG  tablet Take 400 mg by mouth every 6 (six) hours as needed.   Yes Historical Provider, MD  lisinopril (PRINIVIL,ZESTRIL) 20 MG tablet One tab daily Patient taking differently: Take 20 mg by mouth daily. One tab daily 11/25/15  Yes Glynn Octave, MD  metoprolol tartrate (LOPRESSOR) 25 MG tablet Take 1 tablet (25 mg total) by mouth 2 (two) times daily. 11/25/15  Yes Glynn Octave, MD  ranitidine (ZANTAC) 150 MG tablet Take 150 mg by mouth at bedtime.    Yes Historical Provider, MD  traMADol (ULTRAM) 50 MG tablet Take 1-2 tablets by mouth every six hours as needed for pain.  Do not use >8 tabs/day. Patient taking differently: Take 50 mg by mouth 2 (two) times daily. Take 1-2 tablets by mouth every six hours as needed for pain.  Do not use >8 tabs/day. 08/19/15  Yes Melton Krebs, PA-C  traMADol (ULTRAM) 50 MG tablet Take 1 tablet (50 mg total) by mouth every 6 (six) hours as needed. 11/26/15   Bethel Born, PA-C    Family History Family History  Problem Relation Age of Onset  . Stroke Mother   . Hypertension Mother   . Hyperlipidemia Mother   . Stroke Father   . Hypertension Father   . Dementia Father   . Diabetes Father   . Heart disease Father   . Hyperlipidemia Father   . Cancer Sister     brain  . Diabetes Sister   . Heart disease Sister   . Hyperlipidemia Sister   . Hypertension Sister   . Stroke Sister   . Heart disease Brother   . Hyperlipidemia Brother     Social History Social History  Substance Use Topics  . Smoking status: Former Smoker    Packs/day: 0.02    Years: 27.00    Types: Cigarettes    Quit date: 04/05/2015  . Smokeless tobacco: Never Used     Comment: "quit April 2016, I havwe one every now and then"  . Alcohol use No     Allergies   Blueberry flavor; Penicillins; Robaxin [methocarbamol]; and Toradol [ketorolac tromethamine]   Review of Systems Review of Systems  Eyes: Negative for visual disturbance.  Respiratory: Negative for  shortness of breath.   Cardiovascular: Positive for chest pain.  Gastrointestinal: Negative for abdominal pain, nausea and vomiting.  Musculoskeletal: Positive for arthralgias. Negative for back pain and gait problem.  Neurological: Negative for syncope and light-headedness.  All other systems reviewed and are negative.    Physical Exam Updated Vital Signs BP 118/88   Pulse 91   Temp 98.4 F (36.9 C) (Oral)   Resp 13   SpO2 93%   Physical Exam  Constitutional: He is oriented to person, place, and time. He appears well-developed and well-nourished. No distress.  Obese, disheveled  HENT:  Head: Normocephalic and atraumatic. Head is without raccoon's  eyes and without Battle's sign.  Right Ear: No hemotympanum.  Left Ear: No hemotympanum.  Nose: No epistaxis.  Abrasion on right forehead which appears old  Eyes: Conjunctivae are normal. Pupils are equal, round, and reactive to light. Right eye exhibits no discharge. Left eye exhibits no discharge. No scleral icterus.  Neck: Normal range of motion. Neck supple.  Cardiovascular: Normal rate and regular rhythm.  Exam reveals no gallop and no friction rub.   No murmur heard. Pulmonary/Chest: Effort normal and breath sounds normal. No respiratory distress. He has no wheezes. He has no rales. He exhibits tenderness.  Right lower lateral rib tenderness  Abdominal: Soft. He exhibits no distension. There is no tenderness.  Musculoskeletal: He exhibits no edema.  Left wrist: No obvious swelling or deformity. No tenderness to palpation. FROM. N/V intact. Right wrist: No obvious swelling or deformity. No tenderness to palpation. FROM. N/V intact.    Neurological: He is alert and oriented to person, place, and time. No cranial nerve deficit.  Normal gait; moves all extremities x 4  Skin: Skin is warm and dry.  Psychiatric: He has a normal mood and affect.  Nursing note and vitals reviewed.    ED Treatments / Results  Labs (all labs  ordered are listed, but only abnormal results are displayed) Labs Reviewed - No data to display  EKG  EKG Interpretation None       Radiology Dg Chest 2 View  Result Date: 11/25/2015 CLINICAL DATA:  Chest pain.  Hypertension.  Ex-smoker. EXAM: CHEST  2 VIEW COMPARISON:  09/29/2015 FINDINGS: Lateral view degraded by patient arm position. Mild right hemidiaphragm elevation. Midline trachea. Borderline cardiomegaly. Mediastinal contours otherwise within normal limits. No pleural effusion or pneumothorax. Subsegmental atelectasis or scarring involves the left lung base laterally. IMPRESSION: No acute cardiopulmonary disease. Electronically Signed   By: Jeronimo Greaves M.D.   On: 11/25/2015 09:37   Dg Ribs Unilateral Right  Result Date: 11/26/2015 CLINICAL DATA:  50 year old male status post fall from sidewalk with pain. Initial encounter. EXAM: RIGHT RIBS - 2 VIEW COMPARISON:  Chest radiographs 11/25/2015 and earlier. FINDINGS: Lower lung volumes. No pneumothorax or pleural effusion identified. Large body habitus, however fairly good radiographic exposure of the right ribs. Mild motion artifact on image 3. Rib marker placed at the anterolateral right ninth/ tenth rib level. No displaced right rib fracture identified. Other visible osseous structures appear intact. IMPRESSION: No displaced right rib fracture identified. Stable and negative visible right lung. Electronically Signed   By: Odessa Fleming M.D.   On: 11/26/2015 21:21   Dg Wrist Complete Left  Result Date: 11/26/2015 CLINICAL DATA:  50 year old male status post fall from sidewalk with pain. Initial encounter. EXAM: LEFT WRIST - COMPLETE 3+ VIEW COMPARISON:  Left index finger radiographs 05/13/2015. FINDINGS: Distal radius and ulna intact. Carpal bone alignment within normal limits. Scaphoid intact. Small ossific fragment along the dorsal carpal bones (image 3 arrow) appears age indeterminate. Metacarpals appear intact. IMPRESSION: 1. Small age  indeterminate left triquetrum fracture. 2. No other acute fracture or dislocation identified about the left wrist. Electronically Signed   By: Odessa Fleming M.D.   On: 11/26/2015 21:19   Dg Wrist Complete Right  Result Date: 11/26/2015 CLINICAL DATA:  50 year old male status post fall from sidewalk with pain. Initial encounter. EXAM: RIGHT WRIST - COMPLETE 3+ VIEW COMPARISON:  None. FINDINGS: Intact distal radius and ulna. Carpal bone alignment within normal limits. Scaphoid appears intact. There is a small ossific fragment dorsal  to the carpal bones (image 3 arrow), but this appears corticated. Visible metatarsals appear intact. IMPRESSION: No definite acute fracture or dislocation identified about the right wrist; chronic appearing triquetrum fracture sequelae. Electronically Signed   By: Odessa Fleming M.D.   On: 11/26/2015 21:17    Procedures Procedures (including critical care time)  Medications Ordered in ED Medications - No data to display   Initial Impression / Assessment and Plan / ED Course  I have reviewed the triage vital signs and the nursing notes.  Pertinent labs & imaging results that were available during my care of the patient were reviewed by me and considered in my medical decision making (see chart for details).  Clinical Course  Value Comment By Time  DG Wrist Complete Left (Reviewed) Nira Conn, MD 09/30 7834   50 year old male presents with MSK complaints after a fall. Some concerns of him being "groggy" by EMS but on exam does not appear intoxicated. Normal neuro exam. C-spine cleared by NEXUS criteria.  [place] CT score is 0. Xray of ribs and wrist are unremarkable. Wrist brace offered. Tramadol rx given. Patient is ambulatory. Patient is NAD, non-toxic, with stable VS. Patient is informed of clinical course, understands medical decision making process, and agrees with plan. Opportunity for questions provided and all questions answered. Return precautions  given.   Final Clinical Impressions(s) / ED Diagnoses   Final diagnoses:  Fall  Fall, initial encounter    New Prescriptions Discharge Medication List as of 11/26/2015 10:44 PM    START taking these medications   Details  !! traMADol (ULTRAM) 50 MG tablet Take 1 tablet (50 mg total) by mouth every 6 (six) hours as needed., Starting Sun 11/26/2015, Print     !! - Potential duplicate medications found. Please discuss with provider.       Bethel Born, PA-C 11/28/15 1631    Nira Conn, MD 11/29/15 [phone]

## 2015-12-05 ENCOUNTER — Emergency Department (HOSPITAL_COMMUNITY)
Admission: EM | Admit: 2015-12-05 | Discharge: 2015-12-05 | Disposition: A | Discharge status: Home / Self Care | Payer: No Typology Code available for payment source | Attending: Dermatology | Admitting: Dermatology

## 2015-12-05 DIAGNOSIS — I10 Essential (primary) hypertension: Secondary | ICD-10-CM | POA: Insufficient documentation

## 2015-12-05 DIAGNOSIS — Z79899 Other long term (current) drug therapy: Secondary | ICD-10-CM | POA: Insufficient documentation

## 2015-12-05 DIAGNOSIS — Z5321 Procedure and treatment not carried out due to patient leaving prior to being seen by health care provider: Secondary | ICD-10-CM | POA: Insufficient documentation

## 2015-12-05 DIAGNOSIS — Z87891 Personal history of nicotine dependence: Secondary | ICD-10-CM | POA: Insufficient documentation

## 2015-12-05 DIAGNOSIS — R042 Hemoptysis: Secondary | ICD-10-CM | POA: Insufficient documentation

## 2015-12-05 NOTE — ED Triage Notes (Signed)
t states that several weeks ago he fell, landing on his wrists and his R rib area. States that area has been hurting more than usual and he has been spitting up blood when he coughs. Alert and oriented.

## 2015-12-05 NOTE — ED Notes (Signed)
Called pt 3 times to redo vitals and had no answer

## 2015-12-16 ENCOUNTER — Emergency Department (HOSPITAL_COMMUNITY)
Admission: EM | Admit: 2015-12-16 | Discharge: 2015-12-16 | Disposition: A | Discharge status: Home / Self Care | Payer: No Typology Code available for payment source | Attending: Emergency Medicine | Admitting: Emergency Medicine

## 2015-12-16 ENCOUNTER — Encounter (HOSPITAL_COMMUNITY): Payer: Self-pay | Admitting: Emergency Medicine

## 2015-12-16 DIAGNOSIS — I10 Essential (primary) hypertension: Secondary | ICD-10-CM

## 2015-12-16 DIAGNOSIS — Z760 Encounter for issue of repeat prescription: Secondary | ICD-10-CM | POA: Insufficient documentation

## 2015-12-16 DIAGNOSIS — Z87891 Personal history of nicotine dependence: Secondary | ICD-10-CM | POA: Insufficient documentation

## 2015-12-16 DIAGNOSIS — Z79899 Other long term (current) drug therapy: Secondary | ICD-10-CM | POA: Insufficient documentation

## 2015-12-16 MED ORDER — CLONIDINE HCL 0.1 MG PO TABS
0.3000 mg | ORAL_TABLET | Freq: Two times a day (BID) | ORAL | Status: DC
  Filled 2015-12-16: qty 3

## 2015-12-16 MED ORDER — METOPROLOL TARTRATE 25 MG PO TABS
25.0000 mg | ORAL_TABLET | Freq: Two times a day (BID) | ORAL | Status: DC
  Filled 2015-12-16: qty 1

## 2015-12-16 MED ORDER — METOPROLOL TARTRATE 25 MG PO TABS: 25.0000 mg | ORAL_TABLET | Freq: Two times a day (BID) | ORAL | 0 refills | Status: DC

## 2015-12-16 MED ORDER — LISINOPRIL 20 MG PO TABS: 20.0000 mg | ORAL_TABLET | Freq: Every day | ORAL | 0 refills | Status: DC

## 2015-12-16 MED ORDER — CLONIDINE HCL 0.3 MG PO TABS: 0.3000 mg | ORAL_TABLET | Freq: Two times a day (BID) | ORAL | 0 refills | Status: DC

## 2015-12-16 MED ORDER — LISINOPRIL 20 MG PO TABS
20.0000 mg | ORAL_TABLET | Freq: Every day | ORAL | Status: DC
  Administered 2015-12-16: 20 mg via ORAL
  Filled 2015-12-16: qty 1

## 2015-12-16 NOTE — ED Provider Notes (Signed)
[facility]-EMERGENCY DEPT Provider Note   CSN: [id number] Arrival date & time: 12/16/15  1450     History   Chief Complaint Chief Complaint  Patient presents with  . Hypertension  . Medication Refill    HPI Travis [name] is a 50 y.o. male the past medical history of hypertension, PTSD, anxiety presents to the ED today requesting a medication refill. Patient states that he has been out of his blood pressure medication the last week. Patient states he lost his medication. His last dose of lisinopril and clonidine was 5 days ago. Last dose of metoprolol was yesterday. He states he took his blood pressure at home and it was elevated. He reports a mild headache and some nausea. He denies any chest pain, shortness of breath, dizziness, loss of consciousness, paresthesias. Patient has a plan with his primary care doctor at the beginning of November.  HPI  Past Medical History:  Diagnosis Date  . Anxiety    Panic attacks  . Chest pain    Hospital, March, 2014, negative enzymes, patient refused in-hospital  stress test, patient canceled outpatient stress test  . Constipation   . Degenerative disk disease   . Degenerative disk disease   . Depression   . GERD (gastroesophageal reflux disease)   . Hypertension   . Incontinence of urine   . Neuromuscular disorder (HCC)   . Obesity   . Polysubstance abuse   . PTSD (post-traumatic stress disorder)   . Shortness of breath dyspnea    with exertion  . Spinal stenosis   . Spinal stenosis   . Tachycardia - pulse   . Tobacco abuse     Patient Active Problem List   Diagnosis Date Noted  . ARF (acute renal failure) (HCC) 09/01/2015  . Hypotension 09/01/2015  . Acute encephalopathy 09/01/2015  . Hematochezia 04/07/2014  . 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 Major depressive disorder, single episode, moderate (HCC) 10/12/2013  . Morbid obesity (HCC) 10/12/2013  . Essential hypertension 10/12/2013  . Chronic pain syndrome 10/12/2013  .  MDD (major depressive disorder), recurrent episode, severe (HCC) 09/29/2013  . Rectal pain 08/10/2013  . Tachycardia 07/27/2013  . Urine incontinence 07/27/2013  . Rectal bleeding 07/27/2013  . Left arm pain 03/29/2013  . Lumbar radicular pain 03/23/2013  . Bilateral shoulder pain 10/27/2012  . Smoking 10/27/2012  . Dental abscess 07/14/2012  . Hypertension   . Anxiety   . Spinal stenosis   . Tobacco abuse   . Obesity   . Dyslipidemia   . Polysubstance abuse   . Chest pain   . Grief 06/11/2012  . Panic attack 01/21/2012  . HYPERCHOLESTEROLEMIA 02/18/2006  . DEPRESSION 02/18/2006  . GERD 02/18/2006  . HEADACHE 02/18/2006    Past Surgical History:  Procedure Laterality Date  . COLONOSCOPY N/A 08/24/2014   Procedure: COLONOSCOPY;  Surgeon: Charlott Rakes, MD;  Location: [facility] ENDOSCOPY;  Service: Endoscopy;  Laterality: N/A;  . ESOPHAGOGASTRODUODENOSCOPY (EGD) WITH PROPOFOL N/A 08/24/2014   Procedure: ESOPHAGOGASTRODUODENOSCOPY (EGD) WITH PROPOFOL;  Surgeon: Charlott Rakes, MD;  Location: [facility] ENDOSCOPY;  Service: Endoscopy;  Laterality: N/A;  . WISDOM TOOTH EXTRACTION         Home Medications    Prior to Admission medications   Medication Sig Start Date End Date Taking? Authorizing Provider  cloNIDine (CATAPRES) 0.3 MG tablet Take 1 tablet (0.3 mg total) by mouth 2 (two) times daily. 11/25/15   Glynn Octave, MD  ibuprofen (ADVIL,MOTRIN) 200 MG tablet Take 400 mg by mouth every 6 (  six) hours as needed.    Historical Provider, MD  lisinopril (PRINIVIL,ZESTRIL) 20 MG tablet One tab daily Patient taking differently: Take 20 mg by mouth daily. One tab daily 11/25/15   Glynn Octave, MD  metoprolol tartrate (LOPRESSOR) 25 MG tablet Take 1 tablet (25 mg total) by mouth 2 (two) times daily. 11/25/15   Glynn Octave, MD  ranitidine (ZANTAC) 150 MG tablet Take 150 mg by mouth at bedtime.     Historical Provider, MD  traMADol (ULTRAM) 50 MG tablet Take 1-2 tablets by mouth every  six hours as needed for pain.  Do not use >8 tabs/day. Patient taking differently: Take 50 mg by mouth 2 (two) times daily. Take 1-2 tablets by mouth every six hours as needed for pain.  Do not use >8 tabs/day. 08/19/15   Melton Krebs, PA-C  traMADol (ULTRAM) 50 MG tablet Take 1 tablet (50 mg total) by mouth every 6 (six) hours as needed. 11/26/15   Bethel Born, PA-C    Family History Family History  Problem Relation Age of Onset  . Stroke Mother   . Hypertension Mother   . Hyperlipidemia Mother   . Stroke Father   . Hypertension Father   . Dementia Father   . Diabetes Father   . Heart disease Father   . Hyperlipidemia Father   . Cancer Sister     brain  . Diabetes Sister   . Heart disease Sister   . Hyperlipidemia Sister   . Hypertension Sister   . Stroke Sister   . Heart disease Brother   . Hyperlipidemia Brother     Social History Social History  Substance Use Topics  . Smoking status: Former Smoker    Packs/day: 0.02    Years: 27.00    Types: Cigarettes    Quit date: 04/05/2015  . Smokeless tobacco: Never Used     Comment: "quit April 2016, I havwe one every now and then"  . Alcohol use No     Allergies   Blueberry flavor; Penicillins; Robaxin [methocarbamol]; and Toradol [ketorolac tromethamine]   Review of Systems Review of Systems  All other systems reviewed and are negative.    Physical Exam Updated Vital Signs BP (!) 141/102   Pulse 75   Temp 98.8 F (37.1 C) (Oral)   Resp 18   Ht 5\' 11"  (1.803 m)   Wt 116.1 kg   SpO2 99%   BMI 35.69 kg/m   Physical Exam  Constitutional: He is oriented to person, place, and time. He appears well-developed and well-nourished. No distress.  HENT:  Head: Normocephalic and atraumatic.  Mouth/Throat: No oropharyngeal exudate.  Eyes: Conjunctivae and EOM are normal. Pupils are equal, round, and reactive to light. Right eye exhibits no discharge. Left eye exhibits no discharge. No scleral icterus.    Neck: Neck supple. No JVD present.  Cardiovascular: Normal rate, regular rhythm, normal heart sounds and intact distal pulses.  Exam reveals no gallop and no friction rub.   No murmur heard. Pulmonary/Chest: Effort normal and breath sounds normal. No respiratory distress. He has no wheezes. He has no rales. He exhibits no tenderness.  Abdominal: Soft. He exhibits no distension. There is no tenderness. There is no guarding.  Musculoskeletal: Normal range of motion. He exhibits no edema.  Neurological: He is alert and oriented to person, place, and time.  Skin: Skin is warm and dry. No rash noted. He is not diaphoretic. No erythema. No pallor.  Psychiatric: He has a normal  mood and affect. His behavior is normal.  Nursing note and vitals reviewed.    ED Treatments / Results  Labs (all labs ordered are listed, but only abnormal results are displayed) Labs Reviewed - No data to display  EKG  EKG Interpretation None       Radiology No results found.  Procedures Procedures (including critical care time)  Medications Ordered in ED Medications  cloNIDine (CATAPRES) tablet 0.3 mg (not administered)  lisinopril (PRINIVIL,ZESTRIL) tablet 20 mg (not administered)  metoprolol tartrate (LOPRESSOR) tablet 25 mg (not administered)     Initial Impression / Assessment and Plan / ED Course  I have reviewed the triage vital signs and the nursing notes.  Pertinent labs & imaging results that were available during my care of the patient were reviewed by me and considered in my medical decision making (see chart for details).  Clinical Course   50 year old male presents to the ED requesting medication refill blood pressure medications. Blood pressure in the ED is 147/101. His been 5 days since his last dose of BP medication. Patient states he "lost" his medications. Upon review of his medical record is seen in the ED once a month the last several months requesting BP medications due to  "losing his medications". Exam non-concerning. No chest pain or neurological deficits. All other vitals are stable. Encourage patient to follow-up with his primary care doctor is the ED is not appropriate place to have your medications refilled. He expressed his understanding. Home dose of BP medications given in the ED today. Follow-up return precautions outlined in patient discharge instructions.  Final Clinical Impressions(s) / ED Diagnoses   Final diagnoses:  Hypertension, unspecified type    New Prescriptions New Prescriptions   No medications on file     Dub MikesSamantha Tripp Dowless, PA-C 12/16/15 1915    Jacalyn LefevreJulie Haviland, MD 12/16/15 2318

## 2015-12-16 NOTE — ED Triage Notes (Signed)
Pt reports being out of HTN meds for past 2 days. Mild HA and nausea. No emesis.

## 2015-12-16 NOTE — Discharge Instructions (Signed)
Follow up with your PCP for re-evaluation and further medication refills. Return to the ED if you experience severe worsening of your symptoms, chest pain, blurry vision, severe headache.

## 2015-12-24 ENCOUNTER — Encounter (HOSPITAL_COMMUNITY): Payer: Self-pay

## 2015-12-24 ENCOUNTER — Emergency Department (HOSPITAL_COMMUNITY)
Admission: EM | Admit: 2015-12-24 | Discharge: 2015-12-24 | Disposition: A | Discharge status: Home / Self Care | Payer: No Typology Code available for payment source | Attending: Emergency Medicine | Admitting: Emergency Medicine

## 2015-12-24 DIAGNOSIS — W19XXXD Unspecified fall, subsequent encounter: Secondary | ICD-10-CM | POA: Insufficient documentation

## 2015-12-24 DIAGNOSIS — I10 Essential (primary) hypertension: Secondary | ICD-10-CM

## 2015-12-24 DIAGNOSIS — S298XXD Other specified injuries of thorax, subsequent encounter: Secondary | ICD-10-CM

## 2015-12-24 DIAGNOSIS — Z760 Encounter for issue of repeat prescription: Secondary | ICD-10-CM

## 2015-12-24 DIAGNOSIS — Z87891 Personal history of nicotine dependence: Secondary | ICD-10-CM | POA: Insufficient documentation

## 2015-12-24 DIAGNOSIS — S20211D Contusion of right front wall of thorax, subsequent encounter: Secondary | ICD-10-CM

## 2015-12-24 MED ORDER — IBUPROFEN 800 MG PO TABS
800.0000 mg | ORAL_TABLET | Freq: Once | ORAL | Status: AC
  Administered 2015-12-24: 800 mg via ORAL
  Filled 2015-12-24: qty 1

## 2015-12-24 MED ORDER — LISINOPRIL 20 MG PO TABS
20.0000 mg | ORAL_TABLET | Freq: Once | ORAL | Status: AC
  Administered 2015-12-24: 20 mg via ORAL
  Filled 2015-12-24: qty 1

## 2015-12-24 MED ORDER — METOPROLOL TARTRATE 25 MG PO TABS
25.0000 mg | ORAL_TABLET | Freq: Once | ORAL | Status: AC
  Administered 2015-12-24: 25 mg via ORAL
  Filled 2015-12-24: qty 1

## 2015-12-24 MED ORDER — LISINOPRIL 20 MG PO TABS: 20.0000 mg | ORAL_TABLET | Freq: Every day | ORAL | 0 refills | Status: DC

## 2015-12-24 MED ORDER — METOPROLOL TARTRATE 25 MG PO TABS: 25.0000 mg | ORAL_TABLET | Freq: Two times a day (BID) | ORAL | 0 refills | Status: DC

## 2015-12-24 MED ORDER — CLONIDINE HCL 0.3 MG PO TABS: 0.3000 mg | ORAL_TABLET | Freq: Two times a day (BID) | ORAL | 0 refills | Status: DC

## 2015-12-24 NOTE — Discharge Instructions (Signed)
The ER is not an appropriate place to have your medications refilled, you have been given a ONE TIME refill of 1 week supplies of your blood pressure medications, but this will be the last time this will be done as a courtesy. You [facility] in order to have medication refills. Keep your medications in a safe place. Eat a low salt diet. Monitor your blood pressure regularly to ensure you're not having low readings, keep a log to take to your doctor's office when you see them. Use tylenol and motrin as needed for pain. Use heat to affected areas of pain. Follow up with your primary doctor in the next 1 week to have ongoing management of your medical conditions. Return to the ER for emergent changes or worsening symptoms.
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
## 2015-12-29 ENCOUNTER — Encounter (HOSPITAL_COMMUNITY): Payer: Self-pay | Admitting: Emergency Medicine

## 2015-12-29 ENCOUNTER — Emergency Department (HOSPITAL_COMMUNITY)
Admission: EM | Admit: 2015-12-29 | Discharge: 2015-12-29 | Disposition: A | Discharge status: Home / Self Care | Payer: No Typology Code available for payment source | Attending: Emergency Medicine | Admitting: Emergency Medicine

## 2015-12-29 DIAGNOSIS — I10 Essential (primary) hypertension: Secondary | ICD-10-CM | POA: Insufficient documentation

## 2015-12-29 DIAGNOSIS — Z760 Encounter for issue of repeat prescription: Secondary | ICD-10-CM

## 2015-12-29 DIAGNOSIS — Z87891 Personal history of nicotine dependence: Secondary | ICD-10-CM | POA: Insufficient documentation

## 2015-12-29 DIAGNOSIS — Z79899 Other long term (current) drug therapy: Secondary | ICD-10-CM | POA: Insufficient documentation

## 2015-12-29 MED ORDER — LISINOPRIL 20 MG PO TABS: 20.0000 mg | ORAL_TABLET | Freq: Every day | ORAL | 0 refills | Status: DC

## 2015-12-29 MED ORDER — CLONIDINE HCL 0.3 MG PO TABS: 0.3000 mg | ORAL_TABLET | Freq: Two times a day (BID) | ORAL | 0 refills | Status: DC

## 2015-12-29 NOTE — ED Notes (Signed)
MD at bedside. 

## 2015-12-29 NOTE — ED Triage Notes (Addendum)
Patient reports history of HTN.  States he has been out of blood pressure medicine since Wednesday.  Reports he found a dose this morning and took it but last night blood pressure was 180/147.  States he received medication weekly from PCP because he looses medication frequently.  Denies chest pain, headache, dizziness at present.  Patient requesting medication until he goes to doctor next week.  Reports out of clonidine and lisinopril.

## 2015-12-29 NOTE — ED Provider Notes (Signed)
[facility]-EMERGENCY DEPT Provider Note   CSN: [id number] Arrival date & time: 12/29/15  0830     History   Chief Complaint Chief Complaint  Patient presents with  . Medication Refill    HPI Travis [name] is a 50 y.o. male.  HPI Patient presents for medication refill. States he has blackouts due to his PTSD and frequently loses medicine. States the [facility] gives him his medicines once a week so he does not lose more than 1 week supply. States he has been out of his medicine since 2 days ago. States he found some this morning. States it was elevated last night. States he is out of his clonidine and lisinopril. No chest pain. No headache. Denies substance abuse.  Past Medical History:  Diagnosis Date  . Anxiety    Panic attacks  . Chest pain    Hospital, March, 2014, negative enzymes, patient refused in-hospital  stress test, patient canceled outpatient stress test  . Constipation   . Degenerative disk disease   . Degenerative disk disease   . Depression   . GERD (gastroesophageal reflux disease)   . Hypertension   . Incontinence of urine   . Neuromuscular disorder (HCC)   . Obesity   . Polysubstance abuse   . PTSD (post-traumatic stress disorder)   . Shortness of breath dyspnea    with exertion  . Spinal stenosis   . Spinal stenosis   . Tachycardia - pulse   . Tobacco abuse     Patient Active Problem List   Diagnosis Date Noted  . ARF (acute renal failure) (HCC) 09/01/2015  . Hypotension 09/01/2015  . Acute encephalopathy 09/01/2015  . Hematochezia 04/07/2014  . 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 Major depressive disorder, single episode, moderate (HCC) 10/12/2013  . Morbid obesity (HCC) 10/12/2013  . Essential hypertension 10/12/2013  . Chronic pain syndrome 10/12/2013  . MDD (major depressive disorder), recurrent episode, severe (HCC) 09/29/2013  . Rectal pain 08/10/2013  . Tachycardia 07/27/2013  . Urine incontinence 07/27/2013  . Rectal bleeding 07/27/2013    . Left arm pain 03/29/2013  . Lumbar radicular pain 03/23/2013  . Bilateral shoulder pain 10/27/2012  . Smoking 10/27/2012  . Dental abscess 07/14/2012  . Hypertension   . Anxiety   . Spinal stenosis   . Tobacco abuse   . Obesity   . Dyslipidemia   . Polysubstance abuse   . Chest pain   . Grief 06/11/2012  . Panic attack 01/21/2012  . HYPERCHOLESTEROLEMIA 02/18/2006  . DEPRESSION 02/18/2006  . GERD 02/18/2006  . HEADACHE 02/18/2006    Past Surgical History:  Procedure Laterality Date  . COLONOSCOPY N/A 08/24/2014   Procedure: COLONOSCOPY;  Surgeon: Charlott Rakes, MD;  Location: [facility] ENDOSCOPY;  Service: Endoscopy;  Laterality: N/A;  . ESOPHAGOGASTRODUODENOSCOPY (EGD) WITH PROPOFOL N/A 08/24/2014   Procedure: ESOPHAGOGASTRODUODENOSCOPY (EGD) WITH PROPOFOL;  Surgeon: Charlott Rakes, MD;  Location: [facility] ENDOSCOPY;  Service: Endoscopy;  Laterality: N/A;  . WISDOM TOOTH EXTRACTION         Home Medications    Prior to Admission medications   Medication Sig Start Date End Date Taking? Authorizing Provider  cloNIDine (CATAPRES) 0.3 MG tablet Take 1 tablet (0.3 mg total) by mouth 2 (two) times daily. 12/29/15   Benjiman Core, MD  ibuprofen (ADVIL,MOTRIN) 200 MG tablet Take 400 mg by mouth every 6 (six) hours as needed.    Historical Provider, MD  lisinopril (PRINIVIL,ZESTRIL) 20 MG tablet Take 1 tablet (20 mg total) by mouth daily. One tab  daily 12/29/15   [name] Shyheim Tanney, MD  metoprolol (LOPRESSOR) 25 MG tablet Take 1 tablet (25 mg total) by mouth 2 (two) times daily. 12/24/15   Mercedes Camprubi-Soms, PA-C  metoprolol tartrate (LOPRESSOR) 25 MG tablet Take 1 tablet (25 mg total) by mouth 2 (two) times daily. 12/16/15   Samantha Tripp Dowless, PA-C  ranitidine (ZANTAC) 150 MG tablet Take 150 mg by mouth at bedtime.     Historical Provider, MD  traMADol (ULTRAM) 50 MG tablet Take 1-2 tablets by mouth every six hours as needed for pain.  Do not use >8 tabs/day. Patient taking  differently: Take 50 mg by mouth 2 (two) times daily. Take 1-2 tablets by mouth every six hours as needed for pain.  Do not use >8 tabs/day. 08/19/15   [name], PA-C  traMADol (ULTRAM) 50 MG tablet Take 1 tablet (50 mg total) by mouth every 6 (six) hours as needed. 11/26/15   Bethel BornKelly Marie Gekas, PA-C    Family History Family History  Problem Relation Age of Onset  . Stroke Mother   . Hypertension Mother   . Hyperlipidemia Mother   . Stroke Father   . Hypertension Father   . Dementia Father   . Diabetes Father   . Heart disease Father   . Hyperlipidemia Father   . Cancer Sister     brain  . Diabetes Sister   . Heart disease Sister   . Hyperlipidemia Sister   . Hypertension Sister   . Stroke Sister   . Heart disease Brother   . Hyperlipidemia Brother     Social History Social History  Substance Use Topics  . Smoking status: Former Smoker    Packs/day: 0.02    Years: 27.00    Types: Cigarettes    Quit date: 04/05/2015  . Smokeless tobacco: Never Used     Comment: "quit April 2016, I havwe one every now and then"  . Alcohol use No     Allergies   Blueberry flavor; Penicillins; Robaxin [methocarbamol]; and Toradol [ketorolac tromethamine]   Review of Systems Review of Systems  Constitutional: Negative for appetite change.  Respiratory: Negative for shortness of breath.   Cardiovascular: Negative for chest pain.  Gastrointestinal: Negative for abdominal pain.  Genitourinary: Negative for flank pain.  Musculoskeletal: Negative for back pain.  Neurological: Negative for numbness.     Physical Exam Updated Vital Signs BP (!) 159/105 (BP Location: Right Arm)   Pulse 78   Temp 98 F (36.7 C) (Oral)   Resp 18   Ht 5\' 11"  (1.803 m)   Wt 280 lb (127 kg)   SpO2 99%   BMI 39.05 kg/m   Physical Exam  Constitutional: He appears well-developed.  HENT:  Head: Atraumatic.  Eyes: EOM are normal.  Neck: Neck supple.  Pulmonary/Chest: Effort normal.    Abdominal: Soft.  Musculoskeletal: He exhibits no edema.  Neurological: He is alert.     ED Treatments / Results  Labs (all labs ordered are listed, but only abnormal results are displayed) Labs Reviewed - No data to display  EKG  EKG Interpretation None       Radiology No results found.  Procedures Procedures (including critical care time)  Medications Ordered in ED Medications - No data to display   Initial Impression / Assessment and Plan / ED Course  I have reviewed the triage vital signs and the nursing notes.  Pertinent labs & imaging results that were available during my care of the patient  were reviewed by me and considered in my medical decision making (see chart for details).  Clinical Course    Patient with medication refill. History same. States with PTSD uses his medicines. Will refill for 1 week. States he has a follow-up appointment next week Wednesday.  Final Clinical Impressions(s) / ED Diagnoses   Final diagnoses:  Medication refill    New Prescriptions Current Discharge Medication List       Benjiman Core, MD 12/29/15 [phone]

## 2016-03-03 ENCOUNTER — Encounter (HOSPITAL_COMMUNITY): Payer: Self-pay | Admitting: *Deleted

## 2016-03-03 ENCOUNTER — Emergency Department (HOSPITAL_COMMUNITY)
Admission: EM | Admit: 2016-03-03 | Discharge: 2016-03-03 | Disposition: A | Discharge status: Home / Self Care | Payer: No Typology Code available for payment source | Attending: Emergency Medicine | Admitting: Emergency Medicine

## 2016-03-03 DIAGNOSIS — I10 Essential (primary) hypertension: Secondary | ICD-10-CM | POA: Insufficient documentation

## 2016-03-03 DIAGNOSIS — Z87891 Personal history of nicotine dependence: Secondary | ICD-10-CM | POA: Insufficient documentation

## 2016-03-03 MED ORDER — LISINOPRIL 20 MG PO TABS
20.0000 mg | ORAL_TABLET | Freq: Once | ORAL | Status: AC
  Administered 2016-03-03: 20 mg via ORAL
  Filled 2016-03-03: qty 1

## 2016-03-03 MED ORDER — CLONIDINE HCL 0.3 MG PO TABS: 0.3000 mg | ORAL_TABLET | Freq: Two times a day (BID) | ORAL | 0 refills | Status: DC

## 2016-03-03 MED ORDER — LISINOPRIL 20 MG PO TABS: 20.0000 mg | ORAL_TABLET | Freq: Every day | ORAL | 0 refills | Status: DC

## 2016-03-03 MED ORDER — CLONIDINE HCL 0.2 MG PO TABS
0.3000 mg | ORAL_TABLET | Freq: Once | ORAL | Status: AC
  Administered 2016-03-03: 0.3 mg via ORAL
  Filled 2016-03-03: qty 1

## 2016-03-03 NOTE — Discharge Instructions (Signed)
It was our pleasure to provide your ER care today - we hope that you feel better.  Take your blood pressure medication as prescribed.  Follow up with primary care doctor in 1 week for recheck of blood pressure.   Return to ER if worse, new symptoms, other medical emergency.

## 2016-03-03 NOTE — ED Provider Notes (Signed)
[facility]-EMERGENCY DEPT Provider Note   CSN: [id number] Arrival date & time: 03/03/16  0904     History   Chief Complaint Chief Complaint  Patient presents with  . Hypertension    HPI Irene LimboJames Lininger is a 50 y.o. male.  Patient with hx htn, states out of meds for the past week, and needs his lisinopril and clonidine.  Denies headache. No chest pain or discomfort. No sob. No leg swelling. States feels fine, at baseline, but knows he needs his bp meds. [facility], but they had some issue with being able to get his meds last week, so plans to return there this week.    The history is provided by the patient.  Hypertension  Pertinent negatives include no chest pain, no headaches and no shortness of breath.    Past Medical History:  Diagnosis Date  . Anxiety    Panic attacks  . Chest pain    Hospital, March, 2014, negative enzymes, patient refused in-hospital  stress test, patient canceled outpatient stress test  . Constipation   . Degenerative disk disease   . Degenerative disk disease   . Depression   . GERD (gastroesophageal reflux disease)   . Hypertension   . Incontinence of urine   . Neuromuscular disorder (HCC)   . Obesity   . Polysubstance abuse   . PTSD (post-traumatic stress disorder)   . Shortness of breath dyspnea    with exertion  . Spinal stenosis   . Spinal stenosis   . Tachycardia - pulse   . Tobacco abuse     Patient Active Problem List   Diagnosis Date Noted  . ARF (acute renal failure) (HCC) 09/01/2015  . Hypotension 09/01/2015  . Acute encephalopathy 09/01/2015  . Hematochezia 04/07/2014  . 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 Major depressive disorder, single episode, moderate (HCC) 10/12/2013  . Morbid obesity (HCC) 10/12/2013  . Essential hypertension 10/12/2013  . Chronic pain syndrome 10/12/2013  . MDD (major depressive disorder), recurrent episode, severe (HCC) 09/29/2013  . Rectal pain 08/10/2013  . Tachycardia 07/27/2013  .  Urine incontinence 07/27/2013  . Rectal bleeding 07/27/2013  . Left arm pain 03/29/2013  . Lumbar radicular pain 03/23/2013  . Bilateral shoulder pain 10/27/2012  . Smoking 10/27/2012  . Dental abscess 07/14/2012  . Hypertension   . Anxiety   . Spinal stenosis   . Tobacco abuse   . Obesity   . Dyslipidemia   . Polysubstance abuse   . Chest pain   . Grief 06/11/2012  . Panic attack 01/21/2012  . HYPERCHOLESTEROLEMIA 02/18/2006  . DEPRESSION 02/18/2006  . GERD 02/18/2006  . HEADACHE 02/18/2006    Past Surgical History:  Procedure Laterality Date  . COLONOSCOPY N/A 08/24/2014   Procedure: COLONOSCOPY;  Surgeon: Charlott RakesVincent Schooler, MD;  [facility];  Service: Endoscopy;  Laterality: N/A;  . ESOPHAGOGASTRODUODENOSCOPY (EGD) WITH PROPOFOL N/A 08/24/2014   Procedure: ESOPHAGOGASTRODUODENOSCOPY (EGD) WITH PROPOFOL;  Surgeon: Charlott RakesVincent Schooler, MD;  [facility];  Service: Endoscopy;  Laterality: N/A;  . WISDOM TOOTH EXTRACTION         Home Medications    Prior to Admission medications   Medication Sig Start Date End Date Taking? Authorizing Provider  cloNIDine (CATAPRES) 0.3 MG tablet Take 1 tablet (0.3 mg total) by mouth 2 (two) times daily. 12/29/15  Yes [name] Pickering, MD  ibuprofen (ADVIL,MOTRIN) 200 MG tablet Take 400 mg by mouth every 6 (six) hours as needed.   Yes Historical Provider, MD  metoprolol (  LOPRESSOR) 25 MG tablet Take 1 tablet (25 mg total) by mouth 2 (two) times daily. 12/24/15  Yes Mercedes Camprubi-Soms, PA-C  ranitidine (ZANTAC) 150 MG tablet Take 150 mg by mouth at bedtime.    Yes Historical Provider, MD  traMADol (ULTRAM) 50 MG tablet Take 1-2 tablets by mouth every six hours as needed for pain.  Do not use >8 tabs/day. Patient taking differently: Take 50 mg by mouth 2 (two) times daily. Take 1-2 tablets by mouth every six hours as needed for pain.  Do not use >8 tabs/day. 08/19/15  Yes Melton Krebs, PA-C  lisinopril  (PRINIVIL,ZESTRIL) 20 MG tablet Take 1 tablet (20 mg total) by mouth daily. One tab daily 12/29/15   Benjiman Core, MD    Family History Family History  Problem Relation Age of Onset  . Stroke Mother   . Hypertension Mother   . Hyperlipidemia Mother   . Stroke Father   . Hypertension Father   . Dementia Father   . Diabetes Father   . Heart disease Father   . Hyperlipidemia Father   . Cancer Sister     brain  . Diabetes Sister   . Heart disease Sister   . Hyperlipidemia Sister   . Hypertension Sister   . Stroke Sister   . Heart disease Brother   . Hyperlipidemia Brother     Social History Social History  Substance Use Topics  . Smoking status: Former Smoker    Packs/day: 0.02    Years: 27.00    Types: Cigarettes    Quit date: 04/05/2015  . Smokeless tobacco: Never Used     Comment: "quit April 2016, I havwe one every now and then"  . Alcohol use No     Allergies   Blueberry flavor; Penicillins; Robaxin [methocarbamol]; and Toradol [ketorolac tromethamine]   Review of Systems Review of Systems  Constitutional: Negative for fever.  Respiratory: Negative for shortness of breath.   Cardiovascular: Negative for chest pain and leg swelling.  Neurological: Negative for headaches.     Physical Exam Updated Vital Signs BP (!) 159/108   Pulse 69   Temp 98.3 F (36.8 C) (Oral)   Resp 18   SpO2 96%   Physical Exam  Constitutional: He appears well-developed and well-nourished. No distress.  HENT:  Mouth/Throat: Oropharynx is clear and moist.  Eyes: Conjunctivae are normal.  Neck: Neck supple. No tracheal deviation present.  Cardiovascular: Normal rate, regular rhythm, normal heart sounds and intact distal pulses.   Pulmonary/Chest: Effort normal and breath sounds normal. No accessory muscle usage. No respiratory distress.  Abdominal: He exhibits no distension. There is no tenderness.  Musculoskeletal: He exhibits no edema.  Neurological: He is alert.    Steady gait.   Skin: Skin is warm and dry. He is not diaphoretic.  Psychiatric: He has a normal mood and affect.  Nursing note and vitals reviewed.    ED Treatments / Results  Labs (all labs ordered are listed, but only abnormal results are displayed) Labs Reviewed - No data to display  EKG  EKG Interpretation None       Radiology No results found.  Procedures Procedures (including critical care time)  Medications Ordered in ED Medications  cloNIDine (CATAPRES) tablet 0.3 mg (not administered)  lisinopril (PRINIVIL,ZESTRIL) tablet 20 mg (not administered)     Initial Impression / Assessment and Plan / ED Course  I have reviewed the triage vital signs and the nursing notes.  Pertinent labs & imaging results  that were available during my care of the patient were reviewed by me and considered in my medical decision making (see chart for details).  Clinical Course     Patient given a dose of his lisinopril and clonidine.    Will see if CM able to get a few days supply of his meds until f/u.  Patient asymptomatic, and appears stable for d/c.     Final Clinical Impressions(s) / ED Diagnoses   Final diagnoses:  None    New Prescriptions New Prescriptions   No medications on file     Cathren Laine, MD 03/03/16 1047

## 2016-03-03 NOTE — Care Management Note (Signed)
Case Management Note  Patient Details  Name: Travis [name] MRN: [id number] Date of Birth: [birth year]  Subjective/Objective:  50 y.o. M seen in the ED 12 times in 6 months for medication refills, etc. Denies need for financial assistance from CM, needs Rxs so that he can refill  until [facility]. Tells me he has blackouts and losses his Rxs. They have come up with a plan to help him with his continued losses of Rxs and subsequent EDVs. He is to give some of his Rx to his Mother for safe keeping. No further CM needs at this time.                   Action/Plan:Discharge with new Rxs. No further CM needs at this time.    Expected Discharge Date:                  Expected Discharge Plan:  Home/Self Care  In-House Referral:  NA  Discharge planning Services  CM Consult, Medication Assistance  Post Acute Care Choice:  NA Choice offered to:  Patient  DME Arranged:  N/A DME Agency:  NA  HH Arranged:  NA HH Agency:  NA  Status of Service:  Completed, signed off  If discussed at Long Length of Stay Meetings, dates discussed:    Additional Comments:  Yvone NeuCrutchfield, [name], RN 03/03/2016, 11:19 AM

## 2016-03-03 NOTE — ED Notes (Signed)
Case Management at the bedside. 

## 2016-03-03 NOTE — ED Triage Notes (Signed)
Pt reports being homeless and lives at a shelter. Has been out of lisinopril and clonidine since Friday and reports being hypertensive and mild dizziness.

## 2016-04-14 ENCOUNTER — Emergency Department (HOSPITAL_COMMUNITY)
Admission: EM | Admit: 2016-04-14 | Discharge: 2016-04-14 | Disposition: A | Discharge status: Home / Self Care | Payer: No Typology Code available for payment source | Attending: Emergency Medicine | Admitting: Emergency Medicine

## 2016-04-14 ENCOUNTER — Encounter (HOSPITAL_COMMUNITY): Payer: Self-pay | Admitting: Emergency Medicine

## 2016-04-14 DIAGNOSIS — I10 Essential (primary) hypertension: Secondary | ICD-10-CM | POA: Insufficient documentation

## 2016-04-14 DIAGNOSIS — Z79899 Other long term (current) drug therapy: Secondary | ICD-10-CM | POA: Insufficient documentation

## 2016-04-14 DIAGNOSIS — Z760 Encounter for issue of repeat prescription: Secondary | ICD-10-CM | POA: Insufficient documentation

## 2016-04-14 DIAGNOSIS — Z87891 Personal history of nicotine dependence: Secondary | ICD-10-CM | POA: Insufficient documentation

## 2016-04-14 MED ORDER — METOPROLOL TARTRATE 25 MG PO TABS: 25.0000 mg | ORAL_TABLET | Freq: Two times a day (BID) | ORAL | 0 refills | Status: DC

## 2016-04-14 MED ORDER — CLONIDINE HCL 0.3 MG PO TABS: 0.3000 mg | ORAL_TABLET | Freq: Two times a day (BID) | ORAL | 0 refills | Status: DC

## 2016-04-14 NOTE — ED Provider Notes (Signed)
[facility]-EMERGENCY DEPT Provider Note   CSN: [id number] Arrival date & time: 04/14/16  [id number]     History   Chief Complaint Chief Complaint  Patient presents with  . Hypertension  . Medication Refill    HPI Travis LimboJames [name] is a 51 y.o. male.  HPI   51 year old male with extensive medical history including hypertension, hypercholesterolemia, panic attack, polysubstance abuse, anxiety presenting today requesting for medication refill. Patient takes metoprolol, clonidine and lisinopril for his blood pressure. He mentioned that he has ran out of his medications including metoprolol, and clonidine for approximately a week. He did try to follow-up with his primary care doctor but states that she does not work and weekend. He is planning on follow-up with his primary care provider soon but requesting to have his medication fever. Aside from mild nausea he denies having any other symptoms. Specifically no chest pain or shortness of breath.  Past Medical History:  Diagnosis Date  . Anxiety    Panic attacks  . Chest pain    Hospital, March, 2014, negative enzymes, patient refused in-hospital  stress test, patient canceled outpatient stress test  . Constipation   . Degenerative disk disease   . Degenerative disk disease   . Depression   . GERD (gastroesophageal reflux disease)   . Hypertension   . Incontinence of urine   . Neuromuscular disorder (HCC)   . Obesity   . Polysubstance abuse   . PTSD (post-traumatic stress disorder)   . Shortness of breath dyspnea    with exertion  . Spinal stenosis   . Spinal stenosis   . Tachycardia - pulse   . Tobacco abuse     Patient Active Problem List   Diagnosis Date Noted  . ARF (acute renal failure) (HCC) 09/01/2015  . Hypotension 09/01/2015  . Acute encephalopathy 09/01/2015  . Hematochezia 04/07/2014  . 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 Major depressive disorder, single episode, moderate (HCC) 10/12/2013  . Morbid obesity (HCC)  10/12/2013  . Essential hypertension 10/12/2013  . Chronic pain syndrome 10/12/2013  . MDD (major depressive disorder), recurrent episode, severe (HCC) 09/29/2013  . Rectal pain 08/10/2013  . Tachycardia 07/27/2013  . Urine incontinence 07/27/2013  . Rectal bleeding 07/27/2013  . Left arm pain 03/29/2013  . Lumbar radicular pain 03/23/2013  . Bilateral shoulder pain 10/27/2012  . Smoking 10/27/2012  . Dental abscess 07/14/2012  . Hypertension   . Anxiety   . Spinal stenosis   . Tobacco abuse   . Obesity   . Dyslipidemia   . Polysubstance abuse   . Chest pain   . Grief 06/11/2012  . Panic attack 01/21/2012  . HYPERCHOLESTEROLEMIA 02/18/2006  . DEPRESSION 02/18/2006  . GERD 02/18/2006  . HEADACHE 02/18/2006    Past Surgical History:  Procedure Laterality Date  . COLONOSCOPY N/A 08/24/2014   Procedure: COLONOSCOPY;  Surgeon: Charlott RakesVincent Schooler, MD;  [facility];  Service: Endoscopy;  Laterality: N/A;  . ESOPHAGOGASTRODUODENOSCOPY (EGD) WITH PROPOFOL N/A 08/24/2014   Procedure: ESOPHAGOGASTRODUODENOSCOPY (EGD) WITH PROPOFOL;  Surgeon: Charlott RakesVincent Schooler, MD;  [facility];  Service: Endoscopy;  Laterality: N/A;  . WISDOM TOOTH EXTRACTION         Home Medications    Prior to Admission medications   Medication Sig Start Date End Date Taking? Authorizing Provider  cloNIDine (CATAPRES) 0.3 MG tablet Take 1 tablet (0.3 mg total) by mouth 2 (two) times daily. 12/29/15   [name] Pickering, MD  cloNIDine (CATAPRES) 0.3 MG tablet Take 1 tablet (0.3 mg  total) by mouth 2 (two) times daily. 03/03/16   Cathren Laine, MD  ibuprofen (ADVIL,MOTRIN) 200 MG tablet Take 400 mg by mouth every 6 (six) hours as needed.    Historical Provider, MD  lisinopril (PRINIVIL,ZESTRIL) 20 MG tablet Take 1 tablet (20 mg total) by mouth daily. One tab daily 12/29/15   Benjiman Core, MD  lisinopril (PRINIVIL,ZESTRIL) 20 MG tablet Take 1 tablet (20 mg total) by mouth daily. 03/03/16   Cathren Laine, MD  metoprolol (LOPRESSOR) 25 MG tablet Take 1 tablet (25 mg total) by mouth 2 (two) times daily. 12/24/15   Mercedes Street, PA-C  ranitidine (ZANTAC) 150 MG tablet Take 150 mg by mouth at bedtime.     Historical Provider, MD  traMADol (ULTRAM) 50 MG tablet Take 1-2 tablets by mouth every six hours as needed for pain.  Do not use >8 tabs/day. Patient taking differently: Take 50 mg by mouth 2 (two) times daily. Take 1-2 tablets by mouth every six hours as needed for pain.  Do not use >8 tabs/day. 08/19/15   Melton Krebs, PA-C    Family History Family History  Problem Relation Age of Onset  . Stroke Mother   . Hypertension Mother   . Hyperlipidemia Mother   . Stroke Father   . Hypertension Father   . Dementia Father   . Diabetes Father   . Heart disease Father   . Hyperlipidemia Father   . Cancer Sister     brain  . Diabetes Sister   . Heart disease Sister   . Hyperlipidemia Sister   . Hypertension Sister   . Stroke Sister   . Heart disease Brother   . Hyperlipidemia Brother     Social History Social History  Substance Use Topics  . Smoking status: Former Smoker    Packs/day: 0.02    Years: 27.00    Types: Cigarettes    Quit date: 04/05/2015  . Smokeless tobacco: Never Used     Comment: "quit April 2016, I havwe one every now and then"  . Alcohol use No     Allergies   Blueberry flavor; Penicillins; Robaxin [methocarbamol]; and Toradol [ketorolac tromethamine]   Review of Systems Review of Systems  Constitutional: Negative for fever.  Respiratory: Negative for shortness of breath.   Cardiovascular: Negative for chest pain.  Gastrointestinal: Negative for abdominal pain.  All other systems reviewed and are negative.    Physical Exam Updated Vital Signs BP (!) 143/104 (BP Location: Left Arm)   Pulse 83   Temp 98.2 F (36.8 C) (Oral)   Resp 16   Ht 5\' 10"  (1.778 m)   Wt 127 kg   SpO2 100%   BMI 40.18 kg/m   Physical Exam    Constitutional: He appears well-developed and well-nourished. No distress.  HENT:  Head: Atraumatic.  Eyes: Conjunctivae are normal.  Neck: Neck supple.  Cardiovascular: Normal rate and regular rhythm.   Pulmonary/Chest: Effort normal and breath sounds normal.  Neurological: He is alert.  Skin: No rash noted.  Psychiatric: He has a normal mood and affect.  Nursing note and vitals reviewed.    ED Treatments / Results  Labs (all labs ordered are listed, but only abnormal results are displayed) Labs Reviewed - No data to display  EKG  EKG Interpretation None       Radiology No results found.  Procedures Procedures (including critical care time)  Medications Ordered in ED Medications - No data to display   Initial Impression /  Assessment and Plan / ED Course  I have reviewed the triage vital signs and the nursing notes.  Pertinent labs & imaging results that were available during my care of the patient were reviewed by me and considered in my medical decision making (see chart for details).     BP (!) 143/104 (BP Location: Left Arm)   Pulse 83   Temp 98.2 F (36.8 C) (Oral)   Resp 16   Ht 5\' 10"  (1.778 m)   Wt 127 kg   SpO2 100%   BMI 40.18 kg/m    Final Clinical Impressions(s) / ED Diagnoses   Final diagnoses:  Encounter for medication refill    New Prescriptions Current Discharge Medication List     10:17 AM Patient presents requesting for medication refill of his blood pressure medication.  no other concerning complaint. Will refill medication but encouraged patient to follow-up with PCP for further care.    Fayrene Helper, PA-C 04/14/16 1017    Mancel Bale, MD 04/14/16 1524

## 2016-04-14 NOTE — ED Notes (Signed)
Bed: WTR5 Expected date:  Expected time:  Means of arrival:  Comments: 

## 2016-04-14 NOTE — ED Triage Notes (Signed)
Patient is complaining of hypertension and nausea. Reports he has been out of his HTN medications since yesterday morning.

## 2016-05-13 ENCOUNTER — Encounter (HOSPITAL_COMMUNITY): Payer: Self-pay | Admitting: *Deleted

## 2016-05-13 ENCOUNTER — Emergency Department (HOSPITAL_COMMUNITY)
Admission: EM | Admit: 2016-05-13 | Discharge: 2016-05-13 | Disposition: A | Discharge status: Home / Self Care | Payer: No Typology Code available for payment source | Attending: Emergency Medicine | Admitting: Emergency Medicine

## 2016-05-13 DIAGNOSIS — I10 Essential (primary) hypertension: Secondary | ICD-10-CM | POA: Insufficient documentation

## 2016-05-13 DIAGNOSIS — Z87891 Personal history of nicotine dependence: Secondary | ICD-10-CM | POA: Insufficient documentation

## 2016-05-13 DIAGNOSIS — Z760 Encounter for issue of repeat prescription: Secondary | ICD-10-CM

## 2016-05-13 DIAGNOSIS — Z79899 Other long term (current) drug therapy: Secondary | ICD-10-CM | POA: Insufficient documentation

## 2016-05-13 MED ORDER — LISINOPRIL 20 MG PO TABS
20.0000 mg | ORAL_TABLET | Freq: Once | ORAL | Status: AC
  Administered 2016-05-13: 20 mg via ORAL
  Filled 2016-05-13 (×2): qty 1

## 2016-05-13 MED ORDER — METOPROLOL TARTRATE 25 MG PO TABS: 25.0000 mg | ORAL_TABLET | Freq: Two times a day (BID) | ORAL | 0 refills | Status: DC

## 2016-05-13 MED ORDER — CLONIDINE HCL 0.3 MG PO TABS: 0.3000 mg | ORAL_TABLET | Freq: Two times a day (BID) | ORAL | 0 refills | Status: DC

## 2016-05-13 MED ORDER — LISINOPRIL 20 MG PO TABS
20.0000 mg | ORAL_TABLET | Freq: Every day | ORAL | 0 refills | Status: DC
Start: 2016-05-13 — End: 2016-06-01

## 2016-05-13 MED ORDER — METOPROLOL TARTRATE 25 MG PO TABS
25.0000 mg | ORAL_TABLET | Freq: Once | ORAL | Status: AC
  Administered 2016-05-13: 25 mg via ORAL
  Filled 2016-05-13: qty 1

## 2016-05-13 NOTE — ED Provider Notes (Signed)
[facility]-EMERGENCY DEPT Provider Note   CSN: [id number] Arrival date & time: 05/13/16  1408   By signing my name below, I, Freida Busmaniana Omoyeni, attest that this documentation has been prepared under the direction and in the presence of Audry Piliyler Delberta Folts, PA-C. Electronically Signed: Freida Busmaniana Omoyeni, Scribe. 05/13/2016. 2:39 PM.   History   Chief Complaint Chief Complaint  Patient presents with  . Medication Refill     The history is provided by the patient. No language interpreter was used.    HPI Comments:  Travis [name] is a 51 y.o. male who presents to the Emergency Department for a medication refill. He has been out of his Clonidine and Lisinopril  x 3 days and is running low on his metoprolol. He tried to get them refilled today but they were closed. Pt is complaining of elevated BP at this time. He has no other acute complaints or associated symptoms at this time.   Past Medical History:  Diagnosis Date  . Anxiety    Panic attacks  . Chest pain    Hospital, March, 2014, negative enzymes, patient refused in-hospital  stress test, patient canceled outpatient stress test  . Constipation   . Degenerative disk disease   . Degenerative disk disease   . Depression   . GERD (gastroesophageal reflux disease)   . Hypertension   . Incontinence of urine   . Neuromuscular disorder (HCC)   . Obesity   . Polysubstance abuse   . PTSD (post-traumatic stress disorder)   . Shortness of breath dyspnea    with exertion  . Spinal stenosis   . Spinal stenosis   . Tachycardia - pulse   . Tobacco abuse     Patient Active Problem List   Diagnosis Date Noted  . ARF (acute renal failure) (HCC) 09/01/2015  . Hypotension 09/01/2015  . Acute encephalopathy 09/01/2015  . Hematochezia 04/07/2014  . 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 Major depressive disorder, single episode, moderate (HCC) 10/12/2013  . Morbid obesity (HCC) 10/12/2013  . Essential hypertension 10/12/2013  . Chronic pain syndrome  10/12/2013  . MDD (major depressive disorder), recurrent episode, severe (HCC) 09/29/2013  . Rectal pain 08/10/2013  . Tachycardia 07/27/2013  . Urine incontinence 07/27/2013  . Rectal bleeding 07/27/2013  . Left arm pain 03/29/2013  . Lumbar radicular pain 03/23/2013  . Bilateral shoulder pain 10/27/2012  . Smoking 10/27/2012  . Dental abscess 07/14/2012  . Hypertension   . Anxiety   . Spinal stenosis   . Tobacco abuse   . Obesity   . Dyslipidemia   . Polysubstance abuse   . Chest pain   . Grief 06/11/2012  . Panic attack 01/21/2012  . HYPERCHOLESTEROLEMIA 02/18/2006  . DEPRESSION 02/18/2006  . GERD 02/18/2006  . HEADACHE 02/18/2006    Past Surgical History:  Procedure Laterality Date  . COLONOSCOPY N/A 08/24/2014   Procedure: COLONOSCOPY;  Surgeon: Charlott RakesVincent Schooler, MD;  [facility];  Service: Endoscopy;  Laterality: N/A;  . ESOPHAGOGASTRODUODENOSCOPY (EGD) WITH PROPOFOL N/A 08/24/2014   Procedure: ESOPHAGOGASTRODUODENOSCOPY (EGD) WITH PROPOFOL;  Surgeon: Charlott RakesVincent Schooler, MD;  [facility];  Service: Endoscopy;  Laterality: N/A;  . WISDOM TOOTH EXTRACTION         Home Medications    Prior to Admission medications   Medication Sig Start Date End Date Taking? Authorizing Provider  cloNIDine (CATAPRES) 0.3 MG tablet Take 1 tablet (0.3 mg total) by mouth 2 (two) times daily. 04/14/16   Fayrene HelperBowie Tran, PA-C  ibuprofen (ADVIL,MOTRIN) 200 MG tablet  Take 400 mg by mouth every 6 (six) hours as needed.    Historical Provider, MD  lisinopril (PRINIVIL,ZESTRIL) 20 MG tablet Take 1 tablet (20 mg total) by mouth daily. 03/03/16   Cathren Laine, MD  metoprolol tartrate (LOPRESSOR) 25 MG tablet Take 1 tablet (25 mg total) by mouth 2 (two) times daily. 04/14/16   Fayrene Helper, PA-C  ranitidine (ZANTAC) 150 MG tablet Take 150 mg by mouth at bedtime.     Historical Provider, MD    Family History Family History  Problem Relation Age of Onset  . Stroke Mother   .  Hypertension Mother   . Hyperlipidemia Mother   . Stroke Father   . Hypertension Father   . Dementia Father   . Diabetes Father   . Heart disease Father   . Hyperlipidemia Father   . Cancer Sister     brain  . Diabetes Sister   . Heart disease Sister   . Hyperlipidemia Sister   . Hypertension Sister   . Stroke Sister   . Heart disease Brother   . Hyperlipidemia Brother     Social History Social History  Substance Use Topics  . Smoking status: Former Smoker    Packs/day: 0.02    Years: 27.00    Types: Cigarettes    Quit date: 04/05/2015  . Smokeless tobacco: Never Used     Comment: "quit April 2016, I havwe one every now and then"  . Alcohol use No     Allergies   Blueberry flavor; Penicillins; Robaxin [methocarbamol]; and Toradol [ketorolac tromethamine]   Review of Systems Review of Systems  Constitutional: Negative for chills and fever.       + HTN, + Medication refill   Respiratory: Negative for shortness of breath.   Neurological: Negative for weakness and headaches.     Physical Exam Updated Vital Signs BP (!) 157/117   Pulse 74   Temp 98.4 F (36.9 C) (Oral)   Resp 18   SpO2 100%   Physical Exam  Constitutional: He is oriented to person, place, and time. Vital signs are normal. He appears well-developed and well-nourished. No distress.  HENT:  Head: Normocephalic and atraumatic.  Right Ear: Hearing normal.  Left Ear: Hearing normal.  Eyes: Conjunctivae and EOM are normal. Pupils are equal, round, and reactive to light.  Neck: Normal range of motion. Neck supple.  Cardiovascular: Normal rate, regular rhythm, normal heart sounds and intact distal pulses.   Pulmonary/Chest: Effort normal and breath sounds normal.  Abdominal: Soft. He exhibits no distension.  Musculoskeletal: Normal range of motion.  Neurological: He is alert and oriented to person, place, and time.  Skin: Skin is warm and dry.  Psychiatric: He has a normal mood and affect. His  speech is normal and behavior is normal. Thought content normal.  Nursing note and vitals reviewed.  ED Treatments / Results  DIAGNOSTIC STUDIES:  Oxygen Saturation is 100% on RA, normal by my interpretation.    COORDINATION OF CARE:  2:34 PM Discussed treatment plan with pt at bedside and pt agreed to plan.  Labs (all labs ordered are listed, but only abnormal results are displayed) Labs Reviewed - No data to display  EKG  EKG Interpretation None       Radiology No results found.  Procedures Procedures (including critical care time)  Medications Ordered in ED Medications - No data to display   Initial Impression / Assessment and Plan / ED Course  I have reviewed the triage  vital signs and the nursing notes.  Pertinent labs & imaging results that were available during my care of the patient were reviewed by me and considered in my medical decision making (see chart for details).  Final Clinical Impressions(s) / ED Diagnoses      {I have reviewed the relevant previous healthcare records.  {I obtained HPI from historian.   ED Course:  Assessment: Pt here for refill of medication. Medication is not a controlled substance. Will refill medication here. Discussed need to follow up with PCP in 2-3 days.  Pt is safe for discharge at this time.  Disposition/Plan:  DC Home Additional Verbal discharge instructions given and discussed with patient.  Pt Instructed to f/u with PCP in the next week for evaluation and treatment of symptoms. Return precautions given Pt acknowledges and agrees with plan  Supervising Physician Lavera Guise, MD  Final diagnoses:  Medication refill    New Prescriptions New Prescriptions   No medications on file    I personally performed the services described in this documentation, which was scribed in my presence. The recorded information has been reviewed and is accurate.    Audry Pili, PA-C 05/13/16 1449    Lavera Guise,  MD 05/13/16 [phone]

## 2016-05-13 NOTE — Discharge Instructions (Signed)
Please read and follow all provided instructions.  Your diagnoses today include:  1. Medication refill     Tests performed today include: Vital signs. See below for your results today.   Medications prescribed:  Take as prescribed   Home care instructions:  Follow any educational materials contained in this packet.  Follow-up instructions: Please follow-up with your primary care provider for further evaluation of symptoms and treatment   Return instructions:  Please return to the Emergency Department if you do not get better, if you get worse, or new symptoms OR  - Fever (temperature greater than 101.1F)  - Bleeding that does not stop with holding pressure to the area    -Severe pain (please note that you may be more sore the day after your accident)  - Chest Pain  - Difficulty breathing  - Severe nausea or vomiting  - Inability to tolerate food and liquids  - Passing out  - Skin becoming red around your wounds  - Change in mental status (confusion or lethargy)  - New numbness or weakness    Please return if you have any other emergent concerns.  Additional Information:  Your vital signs today were: BP (!) 157/117    Pulse 74    Temp 98.4 F (36.9 C) (Oral)    Resp 18    SpO2 100%  If your blood pressure (BP) was elevated above 135/85 this visit, please have this repeated by your doctor within one month. ---------------
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
## 2016-06-01 ENCOUNTER — Encounter (HOSPITAL_COMMUNITY): Payer: Self-pay

## 2016-06-01 ENCOUNTER — Emergency Department (HOSPITAL_COMMUNITY)
Admission: EM | Admit: 2016-06-01 | Discharge: 2016-06-01 | Disposition: A | Discharge status: Home / Self Care | Payer: No Typology Code available for payment source | Attending: Emergency Medicine | Admitting: Emergency Medicine

## 2016-06-01 DIAGNOSIS — I10 Essential (primary) hypertension: Secondary | ICD-10-CM | POA: Insufficient documentation

## 2016-06-01 DIAGNOSIS — Z87891 Personal history of nicotine dependence: Secondary | ICD-10-CM | POA: Insufficient documentation

## 2016-06-01 MED ORDER — LISINOPRIL 20 MG PO TABS: 20.0000 mg | ORAL_TABLET | Freq: Every day | ORAL | 0 refills | Status: DC

## 2016-06-01 MED ORDER — TRAMADOL HCL 50 MG PO TABS
50.0000 mg | ORAL_TABLET | Freq: Once | ORAL | Status: AC
  Administered 2016-06-01: 50 mg via ORAL
  Filled 2016-06-01: qty 1

## 2016-06-01 MED ORDER — METOPROLOL TARTRATE 25 MG PO TABS
25.0000 mg | ORAL_TABLET | Freq: Once | ORAL | Status: AC
  Administered 2016-06-01: 25 mg via ORAL
  Filled 2016-06-01: qty 1

## 2016-06-01 MED ORDER — LISINOPRIL 20 MG PO TABS
20.0000 mg | ORAL_TABLET | Freq: Once | ORAL | Status: AC
  Administered 2016-06-01: 20 mg via ORAL
  Filled 2016-06-01: qty 1

## 2016-06-01 MED ORDER — CLONIDINE HCL 0.1 MG PO TABS
0.3000 mg | ORAL_TABLET | Freq: Once | ORAL | Status: AC
  Administered 2016-06-01: 0.3 mg via ORAL
  Filled 2016-06-01: qty 3

## 2016-06-01 MED ORDER — METOPROLOL TARTRATE 100 MG PO TABS: 100.0000 mg | ORAL_TABLET | Freq: Two times a day (BID) | ORAL | 0 refills | Status: DC

## 2016-06-01 MED ORDER — CLONIDINE HCL 0.3 MG PO TABS: 0.3000 mg | ORAL_TABLET | Freq: Two times a day (BID) | ORAL | 0 refills | Status: DC

## 2016-06-01 NOTE — Discharge Instructions (Signed)
Please keep your scheduled appointment with your primary care provider this week.  Return to ER for new or worsening symptoms, any additional concerns.
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
## 2016-06-01 NOTE — ED Triage Notes (Signed)
He states "I was just here and they gave me some pills for my blood pressure, but I'm out of them and I need some more--I'm homeless".

## 2016-06-01 NOTE — ED Notes (Signed)
Bed: [facility] Expected date:  Expected time:  Means of arrival:  Comments: 

## 2016-06-01 NOTE — ED Provider Notes (Signed)
[facility]-EMER[facility]Y DEPT Provider Note   CSN: [id number] Arrival date & time: 06/01/16  4098     History   Chief Complaint Chief Complaint  Patient presents with  . Hypertension    HPI Travis [name] is a 51 y.o. male.  The history is provided by the patient and medical records. No language interpreter was used.  Hypertension  Pertinent negatives include no chest pain, no abdominal pain, no headaches and no shortness of breath.    Travis [name] is a 51 y.o. male  with a PMH listed below including HTN who presents to the Emergency Department for concerns of elevated blood pressure x 2 days. Patient states that he is homeless and moves around town a good bit, therefore causing him to misplace his medications.Two days ago, he lost his prescriptions for metoprolol, lisinopril and clonidine. He states that he already has an appointment with his primary care provider [facility]) on Wednesday for refill, but requesting enough blood pressure medication to get him to that appointment. He notes no visual changes, headaches, abdominal pain, chest pain, n/v, shortness of breath. No complaints at this time other than his usual chronic back pain 2/2 degenerative disk disease.   Past Medical History:  Diagnosis Date  . Anxiety    Panic attacks  . Chest pain    Hospital, March, 2014, negative enzymes, patient refused in-hospital  stress test, patient canceled outpatient stress test  . Constipation   . Degenerative disk disease   . Degenerative disk disease   . Depression   . GERD (gastroesophageal reflux disease)   . Hypertension   . Incontinence of urine   . Neuromuscular disorder (HCC)   . Obesity   . Polysubstance abuse   . PTSD (post-traumatic stress disorder)   . Shortness of breath dyspnea    with exertion  . Spinal stenosis   . Spinal stenosis   . Tachycardia - pulse   . Tobacco abuse     Patient Active Problem List   Diagnosis Date Noted  . ARF (acute renal failure) (HCC)  09/01/2015  . Hypotension 09/01/2015  . Acute encephalopathy 09/01/2015  . Hematochezia 04/07/2014  . 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 Major depressive disorder, single episode, moderate (HCC) 10/12/2013  . Morbid obesity (HCC) 10/12/2013  . Essential hypertension 10/12/2013  . Chronic pain syndrome 10/12/2013  . MDD (major depressive disorder), recurrent episode, severe (HCC) 09/29/2013  . Rectal pain 08/10/2013  . Tachycardia 07/27/2013  . Urine incontinence 07/27/2013  . Rectal bleeding 07/27/2013  . Left arm pain 03/29/2013  . Lumbar radicular pain 03/23/2013  . Bilateral shoulder pain 10/27/2012  . Smoking 10/27/2012  . Dental abscess 07/14/2012  . Hypertension   . Anxiety   . Spinal stenosis   . Tobacco abuse   . Obesity   . Dyslipidemia   . Polysubstance abuse   . Chest pain   . Grief 06/11/2012  . Panic attack 01/21/2012  . HYPERCHOLESTEROLEMIA 02/18/2006  . DEPRESSION 02/18/2006  . GERD 02/18/2006  . HEADACHE 02/18/2006    Past Surgical History:  Procedure Laterality Date  . COLONOSCOPY N/A 08/24/2014   Procedure: COLONOSCOPY;  Surgeon: Charlott Rakes, MD;  Location: [facility] ENDOSCOPY;  Service: Endoscopy;  Laterality: N/A;  . ESOPHAGOGASTRODUODENOSCOPY (EGD) WITH PROPOFOL N/A 08/24/2014   Procedure: ESOPHAGOGASTRODUODENOSCOPY (EGD) WITH PROPOFOL;  Surgeon: Charlott Rakes, MD;  Location: [facility] ENDOSCOPY;  Service: Endoscopy;  Laterality: N/A;  . WISDOM TOOTH EXTRACTION         Home Medications  Prior to Admission medications   Medication Sig Start Date End Date Taking? Authorizing Provider  ibuprofen (ADVIL,MOTRIN) 200 MG tablet Take 400 mg by mouth every 6 (six) hours as needed.   Yes Historical Provider, MD  ranitidine (ZANTAC) 150 MG tablet Take 150 mg by mouth at bedtime.    Yes Historical Provider, MD  traMADol (ULTRAM) 50 MG tablet TAKE 1 TABLET BY MOUTH 4 TIMES A DAY AS NEEDED 05/11/16  Yes Historical Provider, MD  cloNIDine (CATAPRES) 0.3 MG  tablet Take 1 tablet (0.3 mg total) by mouth 2 (two) times daily. 06/01/16   Chase Picket Ward, PA-C  lisinopril (PRINIVIL,ZESTRIL) 20 MG tablet Take 1 tablet (20 mg total) by mouth daily. 06/01/16   Chase Picket Ward, PA-C  metoprolol (LOPRESSOR) 100 MG tablet Take 1 tablet (100 mg total) by mouth 2 (two) times daily. 06/01/16   Chase Picket Ward, PA-C    Family History Family History  Problem Relation Age of Onset  . Stroke Mother   . Hypertension Mother   . Hyperlipidemia Mother   . Stroke Father   . Hypertension Father   . Dementia Father   . Diabetes Father   . Heart disease Father   . Hyperlipidemia Father   . Cancer Sister     brain  . Diabetes Sister   . Heart disease Sister   . Hyperlipidemia Sister   . Hypertension Sister   . Stroke Sister   . Heart disease Brother   . Hyperlipidemia Brother     Social History Social History  Substance Use Topics  . Smoking status: Former Smoker    Packs/day: 0.02    Years: 27.00    Types: Cigarettes    Quit date: 04/05/2015  . Smokeless tobacco: Never Used     Comment: "quit April 2016, I havwe one every now and then"  . Alcohol use No     Allergies   Blueberry flavor; Penicillins; Robaxin [methocarbamol]; and Toradol [ketorolac tromethamine]   Review of Systems Review of Systems  Constitutional: Negative for chills and fever.  HENT: Negative for congestion.   Eyes: Negative for visual disturbance.  Respiratory: Negative for cough and shortness of breath.   Cardiovascular: Negative for chest pain, palpitations and leg swelling.  Gastrointestinal: Negative for abdominal pain, nausea and vomiting.  Musculoskeletal: Negative for back pain and neck pain.  Skin: Negative for rash.  Neurological: Negative for headaches.  Hematological: Does not bruise/bleed easily.     Physical Exam Updated Vital Signs BP (!) 140/110 (BP Location: Left Arm)   Pulse 69   Temp 98.4 F (36.9 C)   Resp 16   Ht 6' (1.829 m)   Wt  124.7 kg   SpO2 99%   BMI 37.30 kg/m   Physical Exam  Constitutional: He is oriented to person, place, and time. He appears well-developed and well-nourished. No distress.  HENT:  Head: Normocephalic and atraumatic.  Cardiovascular: Normal rate, regular rhythm and normal heart sounds.   No murmur heard. Pulmonary/Chest: Effort normal and breath sounds normal. No respiratory distress.  Abdominal: Soft. He exhibits no distension. There is no tenderness.  Musculoskeletal: He exhibits no edema.  Neurological: He is alert and oriented to person, place, and time.  Skin: Skin is warm and dry.  Nursing note and vitals reviewed.    ED Treatments / Results  Labs (all labs ordered are listed, but only abnormal results are displayed) Labs Reviewed - No data to display  EKG  EKG Interpretation None  Radiology No results found.  Procedures Procedures (including critical care time)  Medications Ordered in ED Medications  cloNIDine (CATAPRES) tablet 0.3 mg (0.3 mg Oral Given 06/01/16 1023)  metoprolol tartrate (LOPRESSOR) tablet 25 mg (25 mg Oral Given 06/01/16 1024)  lisinopril (PRINIVIL,ZESTRIL) tablet 20 mg (20 mg Oral Given 06/01/16 1024)  traMADol (ULTRAM) tablet 50 mg (50 mg Oral Given 06/01/16 1023)     Initial Impression / Assessment and Plan / ED Course  I have reviewed the triage vital signs and the nursing notes.  Pertinent labs & imaging results that were available during my care of the patient were reviewed by me and considered in my medical decision making (see chart for details).    Travis [name] is a 51 y.o. male who presents to ED for elevated blood pressure. Patient has hx of HTN and lost his medications two days ago. Home BP meds given in ED and BP trending downward appropriately. He has no complaints at this time, appears asymptomatic. Has PCP appointment on Wednesday. Will give rx for home BP meds to get him to this appointment. Safe for discharge and  understands reasons to return. All questions answered.    Final Clinical Impressions(s) / ED Diagnoses   Final diagnoses:  Essential hypertension    New Prescriptions Discharge Medication List as of 06/01/2016 11:40 AM       Chase Picket Ward, PA-C 06/01/16 1153    Loren Racer, MD 06/02/16 1108

## 2016-06-30 ENCOUNTER — Emergency Department (HOSPITAL_COMMUNITY)
Admission: EM | Admit: 2016-06-30 | Discharge: 2016-06-30 | Disposition: A | Discharge status: Home / Self Care | Payer: No Typology Code available for payment source | Attending: Emergency Medicine | Admitting: Emergency Medicine

## 2016-06-30 ENCOUNTER — Encounter (HOSPITAL_COMMUNITY): Payer: Self-pay | Admitting: Emergency Medicine

## 2016-06-30 DIAGNOSIS — I10 Essential (primary) hypertension: Secondary | ICD-10-CM

## 2016-06-30 DIAGNOSIS — F4321 Adjustment disorder with depressed mood: Secondary | ICD-10-CM

## 2016-06-30 DIAGNOSIS — Z87891 Personal history of nicotine dependence: Secondary | ICD-10-CM | POA: Insufficient documentation

## 2016-06-30 DIAGNOSIS — Z79899 Other long term (current) drug therapy: Secondary | ICD-10-CM | POA: Insufficient documentation

## 2016-06-30 DIAGNOSIS — F4320 Adjustment disorder, unspecified: Secondary | ICD-10-CM

## 2016-06-30 MED ORDER — METOPROLOL TARTRATE 25 MG PO TABS
25.0000 mg | ORAL_TABLET | Freq: Once | ORAL | Status: AC
  Administered 2016-06-30: 25 mg via ORAL
  Filled 2016-06-30: qty 1

## 2016-06-30 MED ORDER — METOPROLOL TARTRATE 25 MG PO TABS: 25.0000 mg | ORAL_TABLET | Freq: Two times a day (BID) | ORAL | 0 refills | Status: DC

## 2016-06-30 MED ORDER — LISINOPRIL 20 MG PO TABS
20.0000 mg | ORAL_TABLET | Freq: Once | ORAL | Status: AC
  Administered 2016-06-30: 20 mg via ORAL
  Filled 2016-06-30: qty 1

## 2016-06-30 MED ORDER — CLONIDINE HCL 0.3 MG PO TABS
0.3000 mg | ORAL_TABLET | Freq: Two times a day (BID) | ORAL | 0 refills | Status: DC
Start: 2016-06-30 — End: 2016-08-19

## 2016-06-30 MED ORDER — LISINOPRIL 20 MG PO TABS: 20.0000 mg | ORAL_TABLET | Freq: Every day | ORAL | 0 refills | Status: DC

## 2016-06-30 MED ORDER — LORAZEPAM 1 MG PO TABS
1.0000 mg | ORAL_TABLET | Freq: Once | ORAL | Status: AC
  Administered 2016-06-30: 1 mg via ORAL
  Filled 2016-06-30: qty 1

## 2016-06-30 MED ORDER — CLONIDINE HCL 0.1 MG PO TABS
0.3000 mg | ORAL_TABLET | Freq: Once | ORAL | Status: AC
  Administered 2016-06-30: 0.3 mg via ORAL
  Filled 2016-06-30: qty 3

## 2016-06-30 NOTE — ED Triage Notes (Signed)
Patient reports he has been out of his blood pressure medications x3 days and "has been under a lot of stress." Patient also reports bilateral blurred vision that has been worsening this past week. Ambulatory to triage. Denies chest pain, headache and SOB.

## 2016-06-30 NOTE — ED Provider Notes (Signed)
[facility] DEPT Provider Note   CSN: [id number] Arrival date & time: 06/30/16  1034     History   Chief Complaint Chief Complaint  Patient presents with  . Hypertension    HPI Travis [name] is a 51 y.o. male.  Patient w hx htn, states has been out of his meds for several weeks, and needs his meds until he can see his provider at [facility] next week.  He also indicates his sister died this past Jul 15, 2022, and that is causing him stress/anxiety. Notes hx same, states his mental health provider has taken him off his meds this month, and plans to re-evaluate him next month.  Patient states physical health has been at baseline. Is eating and drinking. No nvd. Denies cough or uri c/o. No fever or chills. No faintness or dizziness. Denies headache. No numbness/weakness or change in normal functional ability. Does feel anxious and stressed, denies depression or thoughts of self harm.    The history is provided by the patient.  Hypertension  Pertinent negatives include no chest pain, no abdominal pain, no headaches and no shortness of breath.    Past Medical History:  Diagnosis Date  . Anxiety    Panic attacks  . Chest pain    Hospital, March, 2014, negative enzymes, patient refused in-hospital  stress test, patient canceled outpatient stress test  . Constipation   . Degenerative disk disease   . Degenerative disk disease   . Depression   . GERD (gastroesophageal reflux disease)   . Hypertension   . Incontinence of urine   . Neuromuscular disorder (HCC)   . Obesity   . Polysubstance abuse   . PTSD (post-traumatic stress disorder)   . Shortness of breath dyspnea    with exertion  . Spinal stenosis   . Spinal stenosis   . Tachycardia - pulse   . Tobacco abuse     Patient Active Problem List   Diagnosis Date Noted  . ARF (acute renal failure) (HCC) 09/01/2015  . Hypotension 09/01/2015  . Acute encephalopathy 09/01/2015  . Hematochezia 04/07/2014  . Psychiatric pseudoseizure  04/07/2014  . Major depressive disorder, single episode, moderate (HCC) 10/12/2013  . Morbid obesity (HCC) 10/12/2013  . Essential hypertension 10/12/2013  . Chronic pain syndrome 10/12/2013  . MDD (major depressive disorder), recurrent episode, severe (HCC) 09/29/2013  . Rectal pain 08/10/2013  . Tachycardia 07/27/2013  . Urine incontinence 07/27/2013  . Rectal bleeding 07/27/2013  . Left arm pain 03/29/2013  . Lumbar radicular pain 03/23/2013  . Bilateral shoulder pain 10/27/2012  . Smoking 10/27/2012  . Dental abscess 07/14/2012  . Hypertension   . Anxiety   . Spinal stenosis   . Tobacco abuse   . Obesity   . Dyslipidemia   . Polysubstance abuse   . Chest pain   . Grief 06/11/2012  . Panic attack 01/21/2012  . HYPERCHOLESTEROLEMIA 02/18/2006  . DEPRESSION 02/18/2006  . GERD 02/18/2006  . HEADACHE 02/18/2006    Past Surgical History:  Procedure Laterality Date  . COLONOSCOPY N/A 08/24/2014   Procedure: COLONOSCOPY;  Surgeon: Charlott Rakes, MD;  Location: [facility] ENDOSCOPY;  Service: Endoscopy;  Laterality: N/A;  . ESOPHAGOGASTRODUODENOSCOPY (EGD) WITH PROPOFOL N/A 08/24/2014   Procedure: ESOPHAGOGASTRODUODENOSCOPY (EGD) WITH PROPOFOL;  Surgeon: Charlott Rakes, MD;  Location: [facility] ENDOSCOPY;  Service: Endoscopy;  Laterality: N/A;  . WISDOM TOOTH EXTRACTION         Home Medications    Prior to Admission medications   Medication Sig Start Date End Date  Taking? Authorizing Provider  cloNIDine (CATAPRES) 0.3 MG tablet Take 1 tablet (0.3 mg total) by mouth 2 (two) times daily. 06/01/16   Chase Picket Ward, PA-C  ibuprofen (ADVIL,MOTRIN) 200 MG tablet Take 400 mg by mouth every 6 (six) hours as needed.    Historical Provider, MD  lisinopril (PRINIVIL,ZESTRIL) 20 MG tablet Take 1 tablet (20 mg total) by mouth daily. 06/01/16   Chase Picket Ward, PA-C  metoprolol (LOPRESSOR) 100 MG tablet Take 1 tablet (100 mg total) by mouth 2 (two) times daily. 06/01/16   Chase Picket Ward,  PA-C  ranitidine (ZANTAC) 150 MG tablet Take 150 mg by mouth at bedtime.     Historical Provider, MD  traMADol (ULTRAM) 50 MG tablet TAKE 1 TABLET BY MOUTH 4 TIMES A DAY AS NEEDED 05/11/16   Historical Provider, MD    Family History Family History  Problem Relation Age of Onset  . Stroke Mother   . Hypertension Mother   . Hyperlipidemia Mother   . Stroke Father   . Hypertension Father   . Dementia Father   . Diabetes Father   . Heart disease Father   . Hyperlipidemia Father   . Cancer Sister     brain  . Diabetes Sister   . Heart disease Sister   . Hyperlipidemia Sister   . Hypertension Sister   . Stroke Sister   . Heart disease Brother   . Hyperlipidemia Brother     Social History Social History  Substance Use Topics  . Smoking status: Former Smoker    Packs/day: 0.02    Years: 27.00    Types: Cigarettes    Quit date: 04/05/2015  . Smokeless tobacco: Never Used     Comment: "quit April 2016, I havwe one every now and then"  . Alcohol use No     Allergies   Blueberry flavor; Penicillins; Robaxin [methocarbamol]; and Toradol [ketorolac tromethamine]   Review of Systems Review of Systems  Constitutional: Negative for chills and fever.  HENT: Negative for sore throat.   Eyes: Negative for pain.  Respiratory: Negative for shortness of breath.   Cardiovascular: Negative for chest pain.  Gastrointestinal: Negative for abdominal pain, diarrhea and vomiting.  Genitourinary: Negative for flank pain.  Musculoskeletal: Negative for back pain and neck pain.  Skin: Negative for rash.  Neurological: Negative for weakness, numbness and headaches.  Hematological: Does not bruise/bleed easily.  Psychiatric/Behavioral: Negative for confusion. The patient is nervous/anxious.      Physical Exam Updated Vital Signs BP (!) 153/111 (BP Location: Left Arm)   Pulse (!) 117   Temp 98.2 F (36.8 C) (Oral)   Resp 18   Ht  (1.778 m)   Wt 124.7 kg   SpO2 99%   BMI 39.46  kg/m   Physical Exam  Constitutional: He appears well-developed and well-nourished. No distress.  HENT:  Mouth/Throat: Oropharynx is clear and moist.  Eyes: Conjunctivae are normal. Pupils are equal, round, and reactive to light.  Neck: Neck supple. No tracheal deviation present. No thyromegaly present.  Cardiovascular: Normal rate, regular rhythm, normal heart sounds and intact distal pulses.   Pulmonary/Chest: Effort normal and breath sounds normal. No accessory muscle usage. No respiratory distress.  Abdominal: He exhibits no distension. There is no tenderness.  obese  Musculoskeletal: He exhibits no edema.  Neurological: He is alert.  Speech clear/fluent. Ambulates w steady gait.   Skin: Skin is warm and dry.  Psychiatric:  Anxious, gets tearful when talking about loss of  sister.   Nursing note and vitals reviewed.    ED Treatments / Results  Labs (all labs ordered are listed, but only abnormal results are displayed) Labs Reviewed - No data to display  EKG  EKG Interpretation None       Radiology No results found.  Procedures Procedures (including critical care time)  Medications Ordered in ED Medications  LORazepam (ATIVAN) tablet 1 mg (not administered)  cloNIDine (CATAPRES) tablet 0.3 mg (not administered)  metoprolol tartrate (LOPRESSOR) tablet 25 mg (not administered)  lisinopril (PRINIVIL,ZESTRIL) tablet 20 mg (not administered)     Initial Impression / Assessment and Plan / ED Course  I have reviewed the triage vital signs and the nursing notes.  Pertinent labs & imaging results that were available during my care of the patient were reviewed by me and considered in my medical decision making (see chart for details).  Pt offered support/reassurance.  Pt given dose of his normal bp meds, dose/meds verified w pt. Clonidine po, metoprolol po, lisinopril po.    Pt given ativan 1 mg for anxiety.  Recheck pt - bp improved. No new c/o. States feels at  baseline.    Final Clinical Impressions(s) / ED Diagnoses   Final diagnoses:  None    New Prescriptions New Prescriptions   No medications on file     Cathren Laine, MD 07/01/16 2218

## 2016-06-30 NOTE — ED Notes (Signed)
Sent message to pharmacy regarding sending pt's Lisinopril to ED.

## 2016-06-30 NOTE — Discharge Instructions (Signed)
It was our pleasure to provide your ER care today - we hope that you feel better.  I am sorry for your loss.   Take your blood pressure medication as prescribed. Limit salt intake. See hypertension instructions.  Follow up with your provider/[facility] this week.  Return to ER if worse, new symptoms, severe headache, chest pain, trouble breathing, other concern.  You were given medication in the ER - no driving for the next 6 hours.

## 2016-06-30 NOTE — ED Notes (Signed)
ED Provider at bedside. 

## 2016-07-07 ENCOUNTER — Emergency Department (HOSPITAL_COMMUNITY)
Admission: EM | Admit: 2016-07-07 | Discharge: 2016-07-07 | Disposition: A | Discharge status: Home / Self Care | Payer: No Typology Code available for payment source | Attending: Emergency Medicine | Admitting: Emergency Medicine

## 2016-07-07 ENCOUNTER — Encounter (HOSPITAL_COMMUNITY): Payer: Self-pay

## 2016-07-07 DIAGNOSIS — M545 Low back pain, unspecified: Secondary | ICD-10-CM

## 2016-07-07 DIAGNOSIS — Z87891 Personal history of nicotine dependence: Secondary | ICD-10-CM | POA: Insufficient documentation

## 2016-07-07 DIAGNOSIS — Z79899 Other long term (current) drug therapy: Secondary | ICD-10-CM | POA: Insufficient documentation

## 2016-07-07 DIAGNOSIS — G8929 Other chronic pain: Secondary | ICD-10-CM

## 2016-07-07 DIAGNOSIS — I10 Essential (primary) hypertension: Secondary | ICD-10-CM | POA: Insufficient documentation

## 2016-07-07 MED ORDER — TRAMADOL HCL 50 MG PO TABS
50.0000 mg | ORAL_TABLET | Freq: Once | ORAL | Status: AC
  Administered 2016-07-07: 50 mg via ORAL
  Filled 2016-07-07: qty 1

## 2016-07-07 MED ORDER — CLONIDINE HCL 0.1 MG PO TABS
0.3000 mg | ORAL_TABLET | Freq: Once | ORAL | Status: AC
  Administered 2016-07-07: 0.3 mg via ORAL
  Filled 2016-07-07: qty 3

## 2016-07-07 MED ORDER — LISINOPRIL 20 MG PO TABS
20.0000 mg | ORAL_TABLET | Freq: Once | ORAL | Status: AC
Start: 2016-07-07 — End: 2016-07-07
  Administered 2016-07-07: 20 mg via ORAL
  Filled 2016-07-07: qty 1

## 2016-07-07 MED ORDER — METOPROLOL TARTRATE 25 MG PO TABS
100.0000 mg | ORAL_TABLET | Freq: Once | ORAL | Status: AC
  Administered 2016-07-07: 100 mg via ORAL
  Filled 2016-07-07: qty 4

## 2016-07-07 NOTE — ED Triage Notes (Signed)
Pt c/o anxiety from PTSD, pt states they have blackouts and cannot remember what happens. Last blackout yesterday at 6pm. Also c/o back pain from spinal stenosis. Pt is also worried about his hypertension.

## 2016-07-07 NOTE — ED Provider Notes (Signed)
[facility] Provider Note   CSN: [id number] Arrival date & time: 07/07/16  0534     History   Chief Complaint Chief Complaint  Patient presents with  . Anxiety    HPI Travis [name] is a 51 y.o. male.  HPI Patient presents to the emergency department because he reports all of his blood pressure meds and other medications were in his backpack which he lost last night.  He does not know where his backpack is.  He is concerned about his hypertension he reports this is making him anxious.  His had increasing family stressors in the past 1-2 weeks.  He is requesting doses of this blood pressure medications today and it is tramadol for his chronic back pain.  He reports ongoing low back pain which is unchanged from his chronic back pain.  No new weakness of his arms or legs.  No injury or trauma.  No chest pain shortness of breath.  Denies headache.  Denies weakness of his arms and legs.  Symptoms are mild to moderate in severity   Past Medical History:  Diagnosis Date  . Anxiety    Panic attacks  . Chest pain    Hospital, March, 2014, negative enzymes, patient refused in-hospital  stress test, patient canceled outpatient stress test  . Constipation   . Degenerative disk disease   . Degenerative disk disease   . Depression   . GERD (gastroesophageal reflux disease)   . Hypertension   . Incontinence of urine   . Neuromuscular disorder (HCC)   . Obesity   . Polysubstance abuse   . PTSD (post-traumatic stress disorder)   . Shortness of breath dyspnea    with exertion  . Spinal stenosis   . Spinal stenosis   . Tachycardia - pulse   . Tobacco abuse     Patient Active Problem List   Diagnosis Date Noted  . ARF (acute renal failure) (HCC) 09/01/2015  . Hypotension 09/01/2015  . Acute encephalopathy 09/01/2015  . Hematochezia 04/07/2014  . 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 Major depressive disorder, single episode, moderate (HCC) 10/12/2013  . Morbid obesity (HCC)  10/12/2013  . Essential hypertension 10/12/2013  . Chronic pain syndrome 10/12/2013  . MDD (major depressive disorder), recurrent episode, severe (HCC) 09/29/2013  . Rectal pain 08/10/2013  . Tachycardia 07/27/2013  . Urine incontinence 07/27/2013  . Rectal bleeding 07/27/2013  . Left arm pain 03/29/2013  . Lumbar radicular pain 03/23/2013  . Bilateral shoulder pain 10/27/2012  . Smoking 10/27/2012  . Dental abscess 07/14/2012  . Hypertension   . Anxiety   . Spinal stenosis   . Tobacco abuse   . Obesity   . Dyslipidemia   . Polysubstance abuse   . Chest pain   . Grief 06/11/2012  . Panic attack 01/21/2012  . HYPERCHOLESTEROLEMIA 02/18/2006  . DEPRESSION 02/18/2006  . GERD 02/18/2006  . HEADACHE 02/18/2006    Past Surgical History:  Procedure Laterality Date  . COLONOSCOPY N/A 08/24/2014   Procedure: COLONOSCOPY;  Surgeon: Charlott Rakes, MD;  Location: [facility] ENDOSCOPY;  Service: Endoscopy;  Laterality: N/A;  . ESOPHAGOGASTRODUODENOSCOPY (EGD) WITH PROPOFOL N/A 08/24/2014   Procedure: ESOPHAGOGASTRODUODENOSCOPY (EGD) WITH PROPOFOL;  Surgeon: Charlott Rakes, MD;  Location: [facility] ENDOSCOPY;  Service: Endoscopy;  Laterality: N/A;  . WISDOM TOOTH EXTRACTION         Home Medications    Prior to Admission medications   Medication Sig Start Date End Date Taking? Authorizing Provider  cloNIDine (CATAPRES) 0.3 MG tablet Take  1 tablet (0.3 mg total) by mouth 2 (two) times daily. 06/01/16   Ward, Chase Picket, PA-C  cloNIDine (CATAPRES) 0.3 MG tablet Take 1 tablet (0.3 mg total) by mouth 2 (two) times daily. 06/30/16   Cathren Laine, MD  ibuprofen (ADVIL,MOTRIN) 200 MG tablet Take 400 mg by mouth every 6 (six) hours as needed.    [provider]  lisinopril (PRINIVIL,ZESTRIL) 20 MG tablet Take 1 tablet (20 mg total) by mouth daily. 06/01/16   Ward, Chase Picket, PA-C  lisinopril (PRINIVIL,ZESTRIL) 20 MG tablet Take 1 tablet (20 mg total) by mouth daily. 06/30/16   Cathren Laine, MD  metoprolol (LOPRESSOR) 100 MG tablet Take 1 tablet (100 mg total) by mouth 2 (two) times daily. 06/01/16   Ward, Chase Picket, PA-C  metoprolol tartrate (LOPRESSOR) 25 MG tablet Take 1 tablet (25 mg total) by mouth 2 (two) times daily. 06/30/16   Cathren Laine, MD  ranitidine (ZANTAC) 150 MG tablet Take 150 mg by mouth at bedtime.     [provider]  traMADol (ULTRAM) 50 MG tablet TAKE 1 TABLET BY MOUTH 4 TIMES A DAY AS NEEDED 05/11/16   [provider]    Family History Family History  Problem Relation Age of Onset  . Stroke Mother   . Hypertension Mother   . Hyperlipidemia Mother   . Stroke Father   . Hypertension Father   . Dementia Father   . Diabetes Father   . Heart disease Father   . Hyperlipidemia Father   . Cancer Sister     brain  . Diabetes Sister   . Heart disease Sister   . Hyperlipidemia Sister   . Hypertension Sister   . Stroke Sister   . Heart disease Brother   . Hyperlipidemia Brother     Social History Social History  Substance Use Topics  . Smoking status: Former Smoker    Packs/day: 0.02    Years: 27.00    Types: Cigarettes    Quit date: 04/05/2015  . Smokeless tobacco: Never Used     Comment: "quit April 2016, I havwe one every now and then"  . Alcohol use No     Allergies   Blueberry flavor; Penicillins; Robaxin [methocarbamol]; and Toradol [ketorolac tromethamine]   Review of Systems Review of Systems  All other systems reviewed and are negative.    Physical Exam Updated Vital Signs BP (!) 158/115 (BP Location: Right Arm)   Pulse (!) 102   Temp 98.4 F (36.9 C) (Oral)   Resp 20   Ht 5\' 10"  (1.778 m)   Wt 265 lb (120.2 kg)   SpO2 98%   BMI 38.02 kg/m   Physical Exam  Constitutional: He is oriented to person, place, and time. He appears well-developed and well-nourished.  HENT:  Head: Normocephalic.  Eyes: EOM are normal.  Neck: Normal range of motion.  Cardiovascular: Normal rate.     Pulmonary/Chest: Effort normal and breath sounds normal.  Abdominal: Soft. There is no tenderness.  Musculoskeletal: Normal range of motion.  No thoracic or lumbar point tenderness  Neurological: He is alert and oriented to person, place, and time.  Psychiatric: He has a normal mood and affect.  Nursing note and vitals reviewed.    ED Treatments / Results  Labs (all labs ordered are listed, but only abnormal results are displayed) Labs Reviewed - No data to display  EKG  EKG Interpretation None       Radiology No results found.  Procedures  Procedures (including critical care time)  Medications Ordered in ED Medications  cloNIDine (CATAPRES) tablet 0.3 mg (not administered)  lisinopril (PRINIVIL,ZESTRIL) tablet 20 mg (not administered)  metoprolol tartrate (LOPRESSOR) tablet 100 mg (not administered)  traMADol (ULTRAM) tablet 50 mg (not administered)     Initial Impression / Assessment and Plan / ED Course  I have reviewed the triage vital signs and the nursing notes.  Pertinent labs & imaging results that were available during my care of the patient were reviewed by me and considered in my medical decision making (see chart for details).     doses of his home medications given in the ER.  He will follow-up with his doctor tomorrow for medication refills  Final Clinical Impressions(s) / ED Diagnoses   Final diagnoses:  Essential hypertension  Chronic low back pain without sciatica, unspecified back pain laterality    New Prescriptions New Prescriptions   No medications on file     Azalia Bilisampos, Cloa Bushong, MD 07/07/16 [phone]

## 2016-07-07 NOTE — ED Notes (Signed)
ED Provider at bedside. 

## 2016-08-19 ENCOUNTER — Emergency Department (HOSPITAL_COMMUNITY)
Admission: EM | Admit: 2016-08-19 | Discharge: 2016-08-19 | Disposition: A | Discharge status: Home / Self Care | Payer: No Typology Code available for payment source | Attending: Emergency Medicine | Admitting: Emergency Medicine

## 2016-08-19 ENCOUNTER — Encounter (HOSPITAL_COMMUNITY): Payer: Self-pay | Admitting: Emergency Medicine

## 2016-08-19 DIAGNOSIS — Z79899 Other long term (current) drug therapy: Secondary | ICD-10-CM | POA: Insufficient documentation

## 2016-08-19 DIAGNOSIS — Z87891 Personal history of nicotine dependence: Secondary | ICD-10-CM | POA: Insufficient documentation

## 2016-08-19 DIAGNOSIS — I10 Essential (primary) hypertension: Secondary | ICD-10-CM | POA: Insufficient documentation

## 2016-08-19 MED ORDER — CLONIDINE HCL 0.3 MG PO TABS: 0.3000 mg | ORAL_TABLET | Freq: Two times a day (BID) | ORAL | 0 refills | Status: DC

## 2016-08-19 MED ORDER — CLONIDINE HCL 0.1 MG PO TABS
0.3000 mg | ORAL_TABLET | Freq: Two times a day (BID) | ORAL | Status: DC
Start: 2016-08-19 — End: 2016-08-19
  Administered 2016-08-19: 0.3 mg via ORAL
  Filled 2016-08-19: qty 3
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
## 2016-08-24 ENCOUNTER — Emergency Department (HOSPITAL_COMMUNITY)
Admission: EM | Admit: 2016-08-24 | Discharge: 2016-08-24 | Disposition: A | Discharge status: Home / Self Care | Payer: No Typology Code available for payment source | Attending: Emergency Medicine | Admitting: Emergency Medicine

## 2016-08-24 ENCOUNTER — Encounter (HOSPITAL_COMMUNITY): Payer: Self-pay | Admitting: Nurse Practitioner

## 2016-08-24 DIAGNOSIS — I160 Hypertensive urgency: Secondary | ICD-10-CM | POA: Insufficient documentation

## 2016-08-24 DIAGNOSIS — Z79899 Other long term (current) drug therapy: Secondary | ICD-10-CM | POA: Insufficient documentation

## 2016-08-24 DIAGNOSIS — Z87891 Personal history of nicotine dependence: Secondary | ICD-10-CM | POA: Insufficient documentation

## 2016-08-24 LAB — I-STAT CHEM 8, ED
BUN: 4 mg/dL — AB (ref 6–20)
CHLORIDE: 99 mmol/L — AB (ref 101–111)
CREATININE: 0.8 mg/dL (ref 0.61–1.24)
Calcium, Ion: 1.11 mmol/L — ABNORMAL LOW (ref 1.15–1.40)
Glucose, Bld: 105 mg/dL — ABNORMAL HIGH (ref 65–99)
HEMATOCRIT: 43 % (ref 39.0–52.0)
Hemoglobin: 14.6 g/dL (ref 13.0–17.0)
POTASSIUM: 3.2 mmol/L — AB (ref 3.5–5.1)
Sodium: 138 mmol/L (ref 135–145)
TCO2: 28 mmol/L (ref 0–100)

## 2016-08-24 LAB — I-STAT TROPONIN, ED: Troponin i, poc: 0 ng/mL (ref 0.00–0.08)

## 2016-08-24 MED ORDER — CLONIDINE HCL 0.3 MG PO TABS: 0.3000 mg | ORAL_TABLET | Freq: Two times a day (BID) | ORAL | 0 refills | Status: DC

## 2016-08-24 MED ORDER — CLONIDINE HCL 0.1 MG PO TABS
0.2000 mg | ORAL_TABLET | Freq: Once | ORAL | Status: AC
  Administered 2016-08-24: 0.2 mg via ORAL
  Filled 2016-08-24: qty 2

## 2016-08-24 NOTE — ED Provider Notes (Signed)
[facility]-EMERGENCY DEPT Provider Note   CSN: [id number] Arrival date & time: 08/24/16  0139  By signing my name below, I, Diona Browner, attest that this documentation has been prepared under the direction and in the presence of Derwood Kaplan, MD. Electronically Signed: Diona Browner, ED Scribe. 08/24/16. 3:46 AM.  History   Chief Complaint Chief Complaint  Patient presents with  . Chest Pain    HPI Travis [name] is a 51 y.o. male who presents to the Emergency Department complaining of intermittent, chest pain since ~ 6 pm 08/23/16. Pt has been out of his medication since Sunday 08/18/16. Pt is homeless and his brother is the one who normally gets him his medication, but was out of time. Pt notes that his current sx are similar to past times when he has not been on his medication. Associated sx include nausea. No modifying factors noted. No hx of heart attacks. He has had a stress test in the past. No drug use. He has cut back from smoking 3 ppd to 2 ppd. He notes wanting to quit. Pt denies fever.  The history is provided by the patient. No language interpreter was used.    Past Medical History:  Diagnosis Date  . Anxiety    Panic attacks  . Chest pain    Hospital, March, 2014, negative enzymes, patient refused in-hospital  stress test, patient canceled outpatient stress test  . Constipation   . Degenerative disk disease   . Degenerative disk disease   . Depression   . GERD (gastroesophageal reflux disease)   . Hypertension   . Incontinence of urine   . Neuromuscular disorder (HCC)   . Obesity   . Polysubstance abuse   . PTSD (post-traumatic stress disorder)   . Shortness of breath dyspnea    with exertion  . Spinal stenosis   . Spinal stenosis   . Tachycardia - pulse   . Tobacco abuse     Patient Active Problem List   Diagnosis Date Noted  . ARF (acute renal failure) (HCC) 09/01/2015  . Hypotension 09/01/2015  . Acute encephalopathy 09/01/2015  . Hematochezia  04/07/2014  . 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 Major depressive disorder, single episode, moderate (HCC) 10/12/2013  . Morbid obesity (HCC) 10/12/2013  . Essential hypertension 10/12/2013  . Chronic pain syndrome 10/12/2013  . MDD (major depressive disorder), recurrent episode, severe (HCC) 09/29/2013  . Rectal pain 08/10/2013  . Tachycardia 07/27/2013  . Urine incontinence 07/27/2013  . Rectal bleeding 07/27/2013  . Left arm pain 03/29/2013  . Lumbar radicular pain 03/23/2013  . Bilateral shoulder pain 10/27/2012  . Smoking 10/27/2012  . Dental abscess 07/14/2012  . Hypertension   . Anxiety   . Spinal stenosis   . Tobacco abuse   . Obesity   . Dyslipidemia   . Polysubstance abuse   . Chest pain   . Grief 06/11/2012  . Panic attack 01/21/2012  . HYPERCHOLESTEROLEMIA 02/18/2006  . DEPRESSION 02/18/2006  . GERD 02/18/2006  . HEADACHE 02/18/2006    Past Surgical History:  Procedure Laterality Date  . COLONOSCOPY N/A 08/24/2014   Procedure: COLONOSCOPY;  Surgeon: Charlott Rakes, MD;  Location: [facility] ENDOSCOPY;  Service: Endoscopy;  Laterality: N/A;  . ESOPHAGOGASTRODUODENOSCOPY (EGD) WITH PROPOFOL N/A 08/24/2014   Procedure: ESOPHAGOGASTRODUODENOSCOPY (EGD) WITH PROPOFOL;  Surgeon: Charlott Rakes, MD;  Location: [facility] ENDOSCOPY;  Service: Endoscopy;  Laterality: N/A;  . WISDOM TOOTH EXTRACTION         Home Medications    Prior to Admission  medications   Medication Sig Start Date End Date Taking? Authorizing Provider  cloNIDine (CATAPRES) 0.3 MG tablet Take 1 tablet (0.3 mg total) by mouth 2 (two) times daily. 08/24/16   Derwood KaplanNanavati, Janace Decker, MD  ibuprofen (ADVIL,MOTRIN) 800 MG tablet Take 800 mg by mouth 2 (two) times daily as needed.    [provider]  ranitidine (ZANTAC) 150 MG tablet Take 150 mg by mouth at bedtime.     [provider]  traMADol (ULTRAM) 50 MG tablet TAKE 1 TABLET BY MOUTH 4 TIMES A DAY AS NEEDED 05/11/16   [provider]     Family History Family History  Problem Relation Age of Onset  . Stroke Mother   . Hypertension Mother   . Hyperlipidemia Mother   . Stroke Father   . Hypertension Father   . Dementia Father   . Diabetes Father   . Heart disease Father   . Hyperlipidemia Father   . Cancer Sister        brain  . Diabetes Sister   . Heart disease Sister   . Hyperlipidemia Sister   . Hypertension Sister   . Stroke Sister   . Heart disease Brother   . Hyperlipidemia Brother     Social History Social History  Substance Use Topics  . Smoking status: Former Smoker    Packs/day: 0.02    Years: 27.00    Types: Cigarettes    Quit date: 04/05/2015  . Smokeless tobacco: Never Used     Comment: "quit April 2016, I havwe one every now and then"  . Alcohol use No     Allergies   Blueberry flavor; Penicillins; Robaxin [methocarbamol]; and Toradol [ketorolac tromethamine]   Review of Systems Review of Systems  Constitutional: Negative for fever.  Cardiovascular: Positive for chest pain.  Gastrointestinal: Positive for nausea.     Physical Exam Updated Vital Signs BP (!) 156/116 (BP Location: Right Arm)   Pulse 86   Temp 99.5 F (37.5 C) (Oral)   Resp 18   SpO2 100%   Physical Exam  Constitutional: He is oriented to person, place, and time. He appears well-developed and well-nourished.  HENT:  Head: Normocephalic.  Eyes: EOM are normal.  Neck: Normal range of motion.  Cardiovascular: Normal rate, regular rhythm and normal heart sounds.   Pulmonary/Chest: Effort normal.  Abdominal: He exhibits no distension.  Musculoskeletal: Normal range of motion.  2+ equal radial pulses.  Neurological: He is alert and oriented to person, place, and time.  Psychiatric: He has a normal mood and affect.  Nursing note and vitals reviewed.    ED Treatments / Results  DIAGNOSTIC STUDIES: Oxygen Saturation is 100% on RA, normal by my interpretation.   COORDINATION OF CARE: 3:46  AM-Discussed next steps with pt. Pt verbalized understanding and is agreeable with the plan.   Labs (all labs ordered are listed, but only abnormal results are displayed) Labs Reviewed  I-STAT CHEM 8, ED - Abnormal; Notable for the following:       Result Value   Potassium 3.2 (*)    Chloride 99 (*)    BUN 4 (*)    Glucose, Bld 105 (*)    Calcium, Ion 1.11 (*)    All other components within normal limits  I-STAT TROPOININ, ED    EKG  EKG Interpretation  Date/Time:  Saturday August 24 2016 01:55:35 EDT Ventricular Rate:  88 PR Interval:    QRS Duration: 102 QT Interval:  377 QTC Calculation: 457  R Axis:   2 Text Interpretation:  Sinus rhythm No acute changes [id number] - not new No significant change since last tracing Confirmed by Derwood Kaplan [phone]) on 08/24/2016 3:43:12 AM       Radiology No results found.  Procedures Procedures (including critical care time)  Medications Ordered in ED Medications  cloNIDine (CATAPRES) tablet 0.2 mg (0.2 mg Oral Given 08/24/16 0354)     Initial Impression / Assessment and Plan / ED Course  I have reviewed the triage vital signs and the nursing notes.  Pertinent labs & imaging results that were available during my care of the patient were reviewed by me and considered in my medical decision making (see chart for details).     Pt comes in with cc of elevated BP. He had some chest discomfort initially, but is chest pain free now. Trop ordered, ekg ordered and his home dose clonidine given. PT reports that he always gets a little chest discomfort when he is not taking his meds. Pt has had a neg stress test as well within the last 1 year.   Final Clinical Impressions(s) / ED Diagnoses   Final diagnoses:  Hypertensive urgency    New Prescriptions Current Discharge Medication List     I personally performed the services described in this documentation, which was scribed in my presence. The recorded information has been  reviewed and is accurate.     Derwood Kaplan, MD 08/24/16 [phone]

## 2016-08-24 NOTE — ED Triage Notes (Signed)
Pt is c/o of chest pain that he is describing as pressure, adds that he is experiencing mild nausea.

## 2016-08-24 NOTE — Discharge Instructions (Signed)
Take the prescribed meds. See your doctor soon, and ask them if you can be taken off of clonidine and started on something else given the fact that you frequently have interruptions which lead to rebound hypertension.
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
## 2016-09-10 ENCOUNTER — Emergency Department (HOSPITAL_COMMUNITY): Payer: No Typology Code available for payment source

## 2016-09-10 ENCOUNTER — Emergency Department (HOSPITAL_COMMUNITY)
Admission: EM | Admit: 2016-09-10 | Discharge: 2016-09-10 | Disposition: A | Discharge status: Home / Self Care | Payer: No Typology Code available for payment source | Attending: Emergency Medicine | Admitting: Emergency Medicine

## 2016-09-10 ENCOUNTER — Encounter (HOSPITAL_COMMUNITY): Payer: Self-pay | Admitting: Emergency Medicine

## 2016-09-10 DIAGNOSIS — I10 Essential (primary) hypertension: Secondary | ICD-10-CM | POA: Insufficient documentation

## 2016-09-10 DIAGNOSIS — Z79899 Other long term (current) drug therapy: Secondary | ICD-10-CM | POA: Insufficient documentation

## 2016-09-10 DIAGNOSIS — Z760 Encounter for issue of repeat prescription: Secondary | ICD-10-CM | POA: Insufficient documentation

## 2016-09-10 DIAGNOSIS — R5381 Other malaise: Secondary | ICD-10-CM | POA: Insufficient documentation

## 2016-09-10 DIAGNOSIS — Z87891 Personal history of nicotine dependence: Secondary | ICD-10-CM | POA: Insufficient documentation

## 2016-09-10 LAB — BASIC METABOLIC PANEL
ANION GAP: 9 (ref 5–15)
BUN: 14 mg/dL (ref 6–20)
CHLORIDE: 100 mmol/L — AB (ref 101–111)
CO2: 28 mmol/L (ref 22–32)
Calcium: 9.1 mg/dL (ref 8.9–10.3)
Creatinine, Ser: 1.25 mg/dL — ABNORMAL HIGH (ref 0.61–1.24)
GFR calc non Af Amer: >60 mL/min (ref >60)
Glucose, Bld: 143 mg/dL — ABNORMAL HIGH (ref 65–99)
POTASSIUM: 3.3 mmol/L — AB (ref 3.5–5.1)
SODIUM: 137 mmol/L (ref 135–145)

## 2016-09-10 LAB — CBC
HEMATOCRIT: 44.7 % (ref 39.0–52.0)
HEMOGLOBIN: 15.1 g/dL (ref 13.0–17.0)
MCH: 29.3 pg (ref 26.0–34.0)
MCHC: 33.8 g/dL (ref 30.0–36.0)
MCV: 86.8 fL (ref 78.0–100.0)
Platelets: 196 10*3/uL (ref 150–400)
RBC: 5.15 MIL/uL (ref 4.22–5.81)
RDW: 14.2 % (ref 11.5–15.5)
WBC: 11.3 10*3/uL — AB (ref 4.0–10.5)

## 2016-09-10 LAB — POCT I-STAT TROPONIN I: Troponin i, poc: 0.01 ng/mL (ref 0.00–0.08)

## 2016-09-10 MED ORDER — CLONIDINE HCL 0.3 MG PO TABS: 0.3000 mg | ORAL_TABLET | Freq: Two times a day (BID) | ORAL | 0 refills | Status: DC
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
## 2016-09-10 NOTE — ED Provider Notes (Signed)
[facility]-EMERGENCY DEPT Provider Note   CSN: [id number] Arrival date & time: 09/10/16  0259     History   Chief Complaint Chief Complaint  Patient presents with  . Chest Pain  . Hypertension    HPI Travis [name] is a 51 y.o. male.  The history is provided by the patient. No language interpreter was used.    Travis [name] is a 51 y.o. male who presents to the Emergency Department complaining of medication refill.  He has been out of his BP meds for the last four days.  He takes clonidine 0.3 mg BID, metoprolol 25 mg daily, lisinopril 20 mg daily.  He lost his medications due to a blackout spell, which is a common occurrence due to his PTSD.  He awoke for days ago in the back of a police vehicle  He reports feels like his BP is going up and down with hot and cold sweats.  No chest pain. He does have ocasional HA.  No numbness, V/D/fevers/sob/cough.  He found one clonidine and took it prior to ED arrival.    Past Medical History:  Diagnosis Date  . Anxiety    Panic attacks  . Chest pain    Hospital, March, 2014, negative enzymes, patient refused in-hospital  stress test, patient canceled outpatient stress test  . Constipation   . Degenerative disk disease   . Degenerative disk disease   . Depression   . GERD (gastroesophageal reflux disease)   . Hypertension   . Incontinence of urine   . Neuromuscular disorder (HCC)   . Obesity   . Polysubstance abuse   . PTSD (post-traumatic stress disorder)   . Shortness of breath dyspnea    with exertion  . Spinal stenosis   . Spinal stenosis   . Tachycardia - pulse   . Tobacco abuse     Patient Active Problem List   Diagnosis Date Noted  . ARF (acute renal failure) (HCC) 09/01/2015  . Hypotension 09/01/2015  . Acute encephalopathy 09/01/2015  . Hematochezia 04/07/2014  . 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 Major depressive disorder, single episode, moderate (HCC) 10/12/2013  . Morbid obesity (HCC) 10/12/2013  . Essential  hypertension 10/12/2013  . Chronic pain syndrome 10/12/2013  . MDD (major depressive disorder), recurrent episode, severe (HCC) 09/29/2013  . Rectal pain 08/10/2013  . Tachycardia 07/27/2013  . Urine incontinence 07/27/2013  . Rectal bleeding 07/27/2013  . Left arm pain 03/29/2013  . Lumbar radicular pain 03/23/2013  . Bilateral shoulder pain 10/27/2012  . Smoking 10/27/2012  . Dental abscess 07/14/2012  . Hypertension   . Anxiety   . Spinal stenosis   . Tobacco abuse   . Obesity   . Dyslipidemia   . Polysubstance abuse   . Chest pain   . Grief 06/11/2012  . Panic attack 01/21/2012  . HYPERCHOLESTEROLEMIA 02/18/2006  . DEPRESSION 02/18/2006  . GERD 02/18/2006  . HEADACHE 02/18/2006    Past Surgical History:  Procedure Laterality Date  . COLONOSCOPY N/A 08/24/2014   Procedure: COLONOSCOPY;  Surgeon: Charlott Rakes, MD;  Location[facility] ENDOSCOPY;  Service: Endoscopy;  Laterality: N/A;  . ESOPHAGOGASTRODUODENOSCOPY (EGD) WITH PROPOFOL N/A 08/24/2014   Procedure: ESOPHAGOGASTRODUODENOSCOPY (EGD) WITH PROPOFOL;  Surgeon: Charlott Rakes, MD;  Location: [facility] ENDOSCOPY;  Service: Endoscopy;  Laterality: N/A;  . WISDOM TOOTH EXTRACTION         Home Medications    Prior to Admission medications   Medication Sig Start Date End Date Taking? Authorizing Provider  cloNIDine (CATAPRES) 0.3  MG tablet Take 1 tablet (0.3 mg total) by mouth 2 (two) times daily. 09/10/16   Tilden Fossa, MD  ibuprofen (ADVIL,MOTRIN) 800 MG tablet Take 800 mg by mouth 2 (two) times daily as needed.    [provider]  ranitidine (ZANTAC) 150 MG tablet Take 150 mg by mouth at bedtime.     [provider]  traMADol (ULTRAM) 50 MG tablet TAKE 1 TABLET BY MOUTH 4 TIMES A DAY AS NEEDED 05/11/16   [provider]    Family History Family History  Problem Relation Age of Onset  . Stroke Mother   . Hypertension Mother   . Hyperlipidemia Mother   . Stroke Father   . Hypertension  Father   . Dementia Father   . Diabetes Father   . Heart disease Father   . Hyperlipidemia Father   . Cancer Sister        brain  . Diabetes Sister   . Heart disease Sister   . Hyperlipidemia Sister   . Hypertension Sister   . Stroke Sister   . Heart disease Brother   . Hyperlipidemia Brother     Social History Social History  Substance Use Topics  . Smoking status: Former Smoker    Packs/day: 0.02    Years: 27.00    Types: Cigarettes    Quit date: 04/05/2015  . Smokeless tobacco: Never Used     Comment: "quit April 2016, I havwe one every now and then"  . Alcohol use No     Allergies   Blueberry flavor; Penicillins; Robaxin [methocarbamol]; and Toradol [ketorolac tromethamine]   Review of Systems Review of Systems  All other systems reviewed and are negative.    Physical Exam Updated Vital Signs BP 128/86 (BP Location: Right Arm)   Pulse 71   Temp 97.7 F (36.5 C) (Oral)   Resp 10   SpO2 93%   Physical Exam  Constitutional: He is oriented to person, place, and time. He appears well-developed and well-nourished.  HENT:  Head: Normocephalic and atraumatic.  Cardiovascular: Normal rate and regular rhythm.   No murmur heard. Pulmonary/Chest: Effort normal and breath sounds normal. No respiratory distress.  Abdominal: Soft. There is no tenderness. There is no rebound and no guarding.  Musculoskeletal: He exhibits no edema or tenderness.  Neurological: He is alert and oriented to person, place, and time.  Skin: Skin is warm and dry.  Psychiatric: He has a normal mood and affect. His behavior is normal.  Nursing note and vitals reviewed.    ED Treatments / Results  Labs (all labs ordered are listed, but only abnormal results are displayed) Labs Reviewed  BASIC METABOLIC PANEL - Abnormal; Notable for the following:       Result Value   Potassium 3.3 (*)    Chloride 100 (*)    Glucose, Bld 143 (*)    Creatinine, Ser 1.25 (*)    All other components  within normal limits  CBC - Abnormal; Notable for the following:    WBC 11.3 (*)    All other components within normal limits  I-STAT TROPOININ, ED  POCT I-STAT TROPONIN I    EKG  EKG Interpretation  Date/Time:  Tuesday September 10 2016 03:11:40 EDT Ventricular Rate:  78 PR Interval:    QRS Duration: 105 QT Interval:  388 QTC Calculation: 442 R Axis:   -24 Text Interpretation:  Sinus rhythm Left ventricular hypertrophy Inferior infarct, old Anterior Q waves, possibly due to LVH Lateral QRS  morphology has changed Confirmed by Paula LibraMolpus, John ([id number]) on 09/10/2016 3:17:10 AM       Radiology Dg Chest 2 View  Result Date: 09/10/2016 CLINICAL DATA:  Sudden onset of chest pain EXAM: CHEST  2 VIEW COMPARISON:  11/25/2015 FINDINGS: Low lung volumes with subsegmental atelectasis at the bases. No pleural effusion. Stable cardiomediastinal silhouette. No pneumothorax. IMPRESSION: Low lung volumes with bibasilar atelectasis Electronically Signed   By: Jasmine PangKim  Fujinaga M.D.   On: 09/10/2016 03:48    Procedures Procedures (including critical care time)  Medications Ordered in ED Medications - No data to display   Initial Impression / Assessment and Plan / ED Course  I have reviewed the triage vital signs and the nursing notes.  Pertinent labs & imaging results that were available during my care of the patient were reviewed by me and considered in my medical decision making (see chart for details).     Pt here seeking medication refill for his blood pressure medications. He has no symptoms in the emergency department. No evidence of hypertensive urgency or serious bacterial infection based on history and examination. Discussed with pt elevation in creatinine - will hold lisinopril at this time. Also discussed normal BP in ED - he may need to decrease his doses of BP meds.  Discussed close outpatient follow up and return precautions.   Final Clinical Impressions(s) / ED Diagnoses   Final  diagnoses:  Medication refill  Malaise    New Prescriptions New Prescriptions   CLONIDINE (CATAPRES) 0.3 MG TABLET    Take 1 tablet (0.3 mg total) by mouth 2 (two) times daily.     Tilden Fossaees, Genoveva Singleton, MD 09/10/16 [phone]

## 2016-09-10 NOTE — Discharge Instructions (Signed)
Your creatinine (kidney function) was slightly elevated today.  Please see your family doctor for recheck in the next 3-5 days for recheck as well as refills on your medications.  Drink plenty of fluids.  Get rechecked immediately if you develop any new or concerning symptoms.

## 2016-09-27 ENCOUNTER — Emergency Department (HOSPITAL_COMMUNITY)
Admission: EM | Admit: 2016-09-27 | Discharge: 2016-09-27 | Disposition: A | Discharge status: Home / Self Care | Payer: No Typology Code available for payment source | Attending: Emergency Medicine | Admitting: Emergency Medicine

## 2016-09-27 ENCOUNTER — Encounter (HOSPITAL_COMMUNITY): Payer: Self-pay | Admitting: Emergency Medicine

## 2016-09-27 DIAGNOSIS — F4310 Post-traumatic stress disorder, unspecified: Secondary | ICD-10-CM | POA: Insufficient documentation

## 2016-09-27 DIAGNOSIS — Z760 Encounter for issue of repeat prescription: Secondary | ICD-10-CM

## 2016-09-27 DIAGNOSIS — I10 Essential (primary) hypertension: Secondary | ICD-10-CM | POA: Insufficient documentation

## 2016-09-27 DIAGNOSIS — Z79899 Other long term (current) drug therapy: Secondary | ICD-10-CM | POA: Insufficient documentation

## 2016-09-27 DIAGNOSIS — Z87891 Personal history of nicotine dependence: Secondary | ICD-10-CM | POA: Insufficient documentation

## 2016-09-27 MED ORDER — CLONIDINE HCL 0.1 MG PO TABS
0.3000 mg | ORAL_TABLET | Freq: Once | ORAL | Status: AC
  Administered 2016-09-27: 0.3 mg via ORAL
  Filled 2016-09-27: qty 3

## 2016-09-27 MED ORDER — METOPROLOL TARTRATE 25 MG PO TABS: 25.0000 mg | ORAL_TABLET | Freq: Two times a day (BID) | ORAL | 0 refills | Status: DC

## 2016-09-27 MED ORDER — LISINOPRIL 20 MG PO TABS: 20.0000 mg | ORAL_TABLET | Freq: Every day | ORAL | 0 refills | Status: DC

## 2016-09-27 MED ORDER — CLONIDINE HCL 0.3 MG PO TABS: 0.3000 mg | ORAL_TABLET | Freq: Two times a day (BID) | ORAL | 0 refills | Status: DC

## 2016-09-27 MED ORDER — METOPROLOL TARTRATE 25 MG PO TABS
25.0000 mg | ORAL_TABLET | Freq: Once | ORAL | Status: AC
  Administered 2016-09-27: 25 mg via ORAL
  Filled 2016-09-27: qty 1

## 2016-09-27 NOTE — ED Triage Notes (Signed)
Pt reports his BP has been fluctuating up and down for the past day. Has not taken his BP medication regularly due to PTSD. Also reports his HR has been between 100-150 at home. HR 101 in triage.  Hx of PTSD. Had flare-up yesterday, and lost his belongings.

## 2016-09-27 NOTE — ED Provider Notes (Addendum)
[facility]-EMERGENCY DEPT Provider Note   CSN: [id number] Arrival date & time: 09/27/16  0806     History   Chief Complaint Chief Complaint  Patient presents with  . Hypertension    HPI Travis [name] is a 51 y.o. male.  HPI  Here for medication refill. States he had his typical "pyschogenic blackout spell" and noted that his bag with his medication, which appears to be a common occurrence. He is currently denying any complaints other than chronic back pain. Denies any headache, chest pain, shortness of breath, dizziness, visual changes. [facility] that [facility].   Past Medical History:  Diagnosis Date  . Anxiety    Panic attacks  . Chest pain    Hospital, March, 2014, negative enzymes, patient refused in-hospital  stress test, patient canceled outpatient stress test  . Constipation   . Degenerative disk disease   . Degenerative disk disease   . Depression   . GERD (gastroesophageal reflux disease)   . Hypertension   . Incontinence of urine   . Neuromuscular disorder (HCC)   . Obesity   . Polysubstance abuse   . PTSD (post-traumatic stress disorder)   . Shortness of breath dyspnea    with exertion  . Spinal stenosis   . Spinal stenosis   . Tachycardia - pulse   . Tobacco abuse     Patient Active Problem List   Diagnosis Date Noted  . ARF (acute renal failure) (HCC) 09/01/2015  . Hypotension 09/01/2015  . Acute encephalopathy 09/01/2015  . Hematochezia 04/07/2014  . 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 Major depressive disorder, single episode, moderate (HCC) 10/12/2013  . Morbid obesity (HCC) 10/12/2013  . Essential hypertension 10/12/2013  . Chronic pain syndrome 10/12/2013  . MDD (major depressive disorder), recurrent episode, severe (HCC) 09/29/2013  . Rectal pain 08/10/2013  . Tachycardia 07/27/2013  . Urine incontinence 07/27/2013  . Rectal bleeding 07/27/2013  . Left arm pain  03/29/2013  . Lumbar radicular pain 03/23/2013  . Bilateral shoulder pain 10/27/2012  . Smoking 10/27/2012  . Dental abscess 07/14/2012  . Hypertension   . Anxiety   . Spinal stenosis   . Tobacco abuse   . Obesity   . Dyslipidemia   . Polysubstance abuse   . Chest pain   . Grief 06/11/2012  . Panic attack 01/21/2012  . HYPERCHOLESTEROLEMIA 02/18/2006  . DEPRESSION 02/18/2006  . GERD 02/18/2006  . HEADACHE 02/18/2006    Past Surgical History:  Procedure Laterality Date  . COLONOSCOPY N/A 08/24/2014   Procedure: COLONOSCOPY;  Surgeon: Charlott RakesVincent Schooler, MD;  [facility];  Service: Endoscopy;  Laterality: N/A;  . ESOPHAGOGASTRODUODENOSCOPY (EGD) WITH PROPOFOL N/A 08/24/2014   Procedure: ESOPHAGOGASTRODUODENOSCOPY (EGD) WITH PROPOFOL;  Surgeon: Charlott RakesVincent Schooler, MD;  [facility];  Service: Endoscopy;  Laterality: N/A;  . WISDOM TOOTH EXTRACTION         Home Medications    Prior to Admission medications   Medication Sig Start Date End Date Taking? Authorizing Provider  cloNIDine (CATAPRES) 0.3 MG tablet Take 1 tablet (0.3 mg total) by mouth 2 (two) times daily. 09/10/16  Yes Tilden Fossaees, Elizabeth, MD  Cyclobenzaprine HCl (FLEXERIL PO) Take 1-2 tablets by mouth as needed (back pain).   Yes [provider]  diazepam (VALIUM) 5 MG tablet Take 5 mg by mouth every 6 (six) hours as needed for anxiety (PTSD).   Yes [provider]  ibuprofen (ADVIL,MOTRIN) 800 MG tablet  Take 800 mg by mouth 2 (two) times daily as needed for moderate pain.    Yes [provider]  metoprolol tartrate (LOPRESSOR) 25 MG tablet Take 25 mg by mouth 2 (two) times daily.   Yes [provider]  OVER THE COUNTER MEDICATION Place 1 spray into both nostrils as needed (stuffy nose).   Yes [provider]  ranitidine (ZANTAC) 150 MG tablet Take 150 mg by mouth at bedtime.    Yes [provider]  traMADol (ULTRAM) 50 MG tablet TAKE 1 TABLET BY MOUTH 4  TIMES A DAY AS NEEDED 05/11/16  Yes [provider]  cloNIDine (CATAPRES) 0.3 MG tablet Take 1 tablet (0.3 mg total) by mouth 2 (two) times daily. 09/27/16 10/12/16  Nira Connardama, Pedro Eduardo, MD  lisinopril (PRINIVIL,ZESTRIL) 20 MG tablet Take 1 tablet (20 mg total) by mouth daily. 09/27/16 10/12/16  Nira Connardama, Pedro Eduardo, MD  metoprolol tartrate (LOPRESSOR) 25 MG tablet Take 1 tablet (25 mg total) by mouth 2 (two) times daily. 09/27/16 10/12/16  Nira Connardama, Pedro Eduardo, MD    Family History Family History  Problem Relation Age of Onset  . Stroke Mother   . Hypertension Mother   . Hyperlipidemia Mother   . Stroke Father   . Hypertension Father   . Dementia Father   . Diabetes Father   . Heart disease Father   . Hyperlipidemia Father   . Cancer Sister        brain  . Diabetes Sister   . Heart disease Sister   . Hyperlipidemia Sister   . Hypertension Sister   . Stroke Sister   . Heart disease Brother   . Hyperlipidemia Brother     Social History Social History  Substance Use Topics  . Smoking status: Former Smoker    Packs/day: 0.02    Years: 27.00    Types: Cigarettes    Quit date: 04/05/2015  . Smokeless tobacco: Never Used     Comment: "quit April 2016, I havwe one every now and then"  . Alcohol use No     Allergies   Blueberry flavor; Penicillins; Robaxin [methocarbamol]; and Toradol [ketorolac tromethamine]   Review of Systems Review of Systems All other systems are reviewed and are negative for acute change except as noted in the HPI   Physical Exam Updated Vital Signs BP (!) 145/109   Pulse (!) 101   Temp 97.9 F (36.6 C) (Oral)   Resp 16   SpO2 99%   Physical Exam  Constitutional: He is oriented to person, place, and time. He appears well-developed and well-nourished. No distress.  HENT:  Head: Normocephalic and atraumatic.  Nose: Nose normal.  Eyes: Pupils are equal, round, and reactive to light. Conjunctivae and EOM are normal. Right eye  exhibits no discharge. Left eye exhibits no discharge. No scleral icterus.  Neck: Normal range of motion. Neck supple.  Cardiovascular: Normal rate and regular rhythm.  Exam reveals no gallop and no friction rub.   No murmur heard. Pulmonary/Chest: Effort normal and breath sounds normal. No stridor. No respiratory distress. He has no rales.  Abdominal: Soft. He exhibits no distension. There is no tenderness.  Musculoskeletal: He exhibits no edema or tenderness.  Neurological: He is alert and oriented to person, place, and time.  Skin: Skin is warm and dry. No rash noted. He is not diaphoretic. No erythema.  Psychiatric: He has a normal mood and affect.  Vitals reviewed.    ED Treatments / Results  Labs (all  labs ordered are listed, but only abnormal results are displayed) Labs Reviewed - No data to display  EKG EKG Interpretation  Date/Time: 09/27/2016  08:44:49   Ventricular Rate:    88 PR Interval:   168 QRS Duration:  103 QT Interval:   396 QTC Calculation:  480 Text Interpretation:  SNR. Occasional PVCs.    No results found.  Procedures Procedures (including critical care time)  Medications Ordered in ED Medications  cloNIDine (CATAPRES) tablet 0.3 mg (not administered)  metoprolol tartrate (LOPRESSOR) tablet 25 mg (not administered)     Initial Impression / Assessment and Plan / ED Course  I have reviewed the triage vital signs and the nursing notes.  Pertinent labs & imaging results that were available during my care of the patient were reviewed by me and considered in my medical decision making (see chart for details).     Medications verified by phone tech. Patient is asymptomatic. EKG without acute ischemic changes, dysrhythmias other than occasional PVCs.  The patient is safe for discharge with strict return precautions.   Final Clinical Impressions(s) / ED Diagnoses   Final diagnoses:  Medication refill   Disposition: Discharge  Condition:  Good  I have discussed the results, Dx and Tx plan with the patient who expressed understanding and agree(s) with the plan. Discharge instructions discussed at great length. The patient was given strict return precautions who verbalized understanding of the instructions. No further questions at time of discharge.    New Prescriptions   CLONIDINE (CATAPRES) 0.3 MG TABLET    Take 1 tablet (0.3 mg total) by mouth 2 (two) times daily.   METOPROLOL TARTRATE (LOPRESSOR) 25 MG TABLET    Take 1 tablet (25 mg total) by mouth 2 (two) times daily.    Follow Up: Lavinia Sharps, NP [address] [name] [address] [phone]         Nira Conn, MD 09/27/16 [phone]

## 2016-10-14 ENCOUNTER — Emergency Department (HOSPITAL_COMMUNITY): Payer: Self-pay

## 2016-10-14 ENCOUNTER — Emergency Department (HOSPITAL_COMMUNITY)
Admission: EM | Admit: 2016-10-14 | Discharge: 2016-10-14 | Disposition: A | Discharge status: Home / Self Care | Payer: Self-pay | Attending: Emergency Medicine | Admitting: Emergency Medicine

## 2016-10-14 ENCOUNTER — Encounter (HOSPITAL_COMMUNITY): Payer: Self-pay | Admitting: Emergency Medicine

## 2016-10-14 DIAGNOSIS — Z87891 Personal history of nicotine dependence: Secondary | ICD-10-CM | POA: Insufficient documentation

## 2016-10-14 DIAGNOSIS — R51 Headache: Secondary | ICD-10-CM | POA: Insufficient documentation

## 2016-10-14 DIAGNOSIS — I10 Essential (primary) hypertension: Secondary | ICD-10-CM | POA: Insufficient documentation

## 2016-10-14 DIAGNOSIS — Z760 Encounter for issue of repeat prescription: Secondary | ICD-10-CM | POA: Insufficient documentation

## 2016-10-14 DIAGNOSIS — R519 Headache, unspecified: Secondary | ICD-10-CM

## 2016-10-14 DIAGNOSIS — Z79899 Other long term (current) drug therapy: Secondary | ICD-10-CM | POA: Insufficient documentation

## 2016-10-14 DIAGNOSIS — R0789 Other chest pain: Secondary | ICD-10-CM | POA: Insufficient documentation

## 2016-10-14 LAB — I-STAT CHEM 8, ED
BUN: <3 mg/dL — ABNORMAL LOW (ref 6–20)
CHLORIDE: 96 mmol/L — AB (ref 101–111)
Calcium, Ion: 1.07 mmol/L — ABNORMAL LOW (ref 1.15–1.40)
Creatinine, Ser: 0.6 mg/dL — ABNORMAL LOW (ref 0.61–1.24)
GLUCOSE: 161 mg/dL — AB (ref 65–99)
HEMATOCRIT: 46 % (ref 39.0–52.0)
HEMOGLOBIN: 15.6 g/dL (ref 13.0–17.0)
POTASSIUM: 3.4 mmol/L — AB (ref 3.5–5.1)
SODIUM: 137 mmol/L (ref 135–145)
TCO2: 30 mmol/L (ref 0–100)

## 2016-10-14 LAB — I-STAT TROPONIN, ED: TROPONIN I, POC: 0 ng/mL (ref 0.00–0.08)

## 2016-10-14 MED ORDER — LISINOPRIL 20 MG PO TABS
20.0000 mg | ORAL_TABLET | Freq: Every day | ORAL | Status: DC
  Administered 2016-10-14: 20 mg via ORAL
  Filled 2016-10-14: qty 1

## 2016-10-14 MED ORDER — ACETAMINOPHEN 325 MG PO TABS
650.0000 mg | ORAL_TABLET | Freq: Once | ORAL | Status: DC
  Filled 2016-10-14: qty 2

## 2016-10-14 MED ORDER — CLONIDINE HCL 0.3 MG PO TABS: 0.3000 mg | ORAL_TABLET | Freq: Two times a day (BID) | ORAL | 0 refills | Status: DC

## 2016-10-14 MED ORDER — LISINOPRIL 20 MG PO TABS: 20.0000 mg | ORAL_TABLET | Freq: Every day | ORAL | 0 refills | Status: DC

## 2016-10-14 MED ORDER — METOPROLOL TARTRATE 25 MG PO TABS: 25.0000 mg | ORAL_TABLET | Freq: Two times a day (BID) | ORAL | 0 refills | Status: DC

## 2016-10-14 NOTE — ED Provider Notes (Signed)
[facility]-EMERGENCY DEPT Provider Note   CSN: [id number] Arrival date & time: 10/14/16  0704     History   Chief Complaint Chief Complaint  Patient presents with  . Medication Refill  . Chest Pain    HPI Travis LimboJames [name] is a 51 y.o. male.  HPI Patient presents to the emergency room for complaints of headache that has been constant since Thursday as well as some chest discomfort that started on Friday. Patient has a history of hypertension. He states he ran out of his medications on Thursday. His doctor's office was closed on Friday and he was waiting over the weekend to see if he could contact his doctor once the office opened again. His symptoms have persisted and this morning he was having too much discomfort for him to wait. Patient states the headache is a constant headache. He denies any numbness or weakness. No nausea or vomiting.  No numbness or weakness.  No fevers or chills. He has a constant mild ache in his chest as well.  Nothing makes it better or worse.    Pt ha a history of similar episodes frequently bringing him to the ED.   Past Medical History:  Diagnosis Date  . Anxiety    Panic attacks  . Chest pain    Hospital, March, 2014, negative enzymes, patient refused in-hospital  stress test, patient canceled outpatient stress test  . Constipation   . Degenerative disk disease   . Degenerative disk disease   . Depression   . GERD (gastroesophageal reflux disease)   . Hypertension   . Incontinence of urine   . Neuromuscular disorder (HCC)   . Obesity   . Polysubstance abuse   . PTSD (post-traumatic stress disorder)   . Shortness of breath dyspnea    with exertion  . Spinal stenosis   . Spinal stenosis   . Tachycardia - pulse   . Tobacco abuse        Past Surgical History:  Procedure Laterality Date  . COLONOSCOPY N/A 08/24/2014   Procedure: COLONOSCOPY;  Surgeon: Charlott RakesVincent Schooler, MD;  [facility];  Service: Endoscopy;  Laterality: N/A;  .  ESOPHAGOGASTRODUODENOSCOPY (EGD) WITH PROPOFOL N/A 08/24/2014   Procedure: ESOPHAGOGASTRODUODENOSCOPY (EGD) WITH PROPOFOL;  Surgeon: Charlott RakesVincent Schooler, MD;  [facility];  Service: Endoscopy;  Laterality: N/A;  . WISDOM TOOTH EXTRACTION         Home Medications    Prior to Admission medications   Medication Sig Start Date End Date Taking? Authorizing Provider  ranitidine (ZANTAC) 150 MG tablet Take 150 mg by mouth at bedtime.    Yes [provider]  traMADol (ULTRAM) 50 MG tablet Take 50 mg by mouth every 6 (six) hours as needed for moderate pain.   Yes [provider]  cloNIDine (CATAPRES) 0.3 MG tablet Take 1 tablet (0.3 mg total) by mouth 2 (two) times daily. Patient not taking: Reported on 10/14/2016 09/10/16   Tilden Fossaees, Elizabeth, MD  cloNIDine (CATAPRES) 0.3 MG tablet Take 1 tablet (0.3 mg total) by mouth 2 (two) times daily. 10/14/16 10/29/16  Linwood DibblesKnapp, Ozro Russett, MD  lisinopril (PRINIVIL,ZESTRIL) 20 MG tablet Take 1 tablet (20 mg total) by mouth daily. 10/14/16 10/29/16  Linwood DibblesKnapp, Kayra Crowell, MD  metoprolol tartrate (LOPRESSOR) 25 MG tablet Take 1 tablet (25 mg total) by mouth 2 (two) times daily. 10/14/16 10/29/16  Linwood DibblesKnapp, Raylee Strehl, MD    Family History Family History  Problem Relation Age of Onset  . Stroke Mother   . Hypertension Mother   .  Hyperlipidemia Mother   . Stroke Father   . Hypertension Father   . Dementia Father   . Diabetes Father   . Heart disease Father   . Hyperlipidemia Father   . Cancer Sister        brain  . Diabetes Sister   . Heart disease Sister   . Hyperlipidemia Sister   . Hypertension Sister   . Stroke Sister   . Heart disease Brother   . Hyperlipidemia Brother     Social History Social History  Substance Use Topics  . Smoking status: Former Smoker    Packs/day: 0.02    Years: 27.00    Types: Cigarettes    Quit date: 04/05/2015  . Smokeless tobacco: Never Used     Comment: "quit April 2016, I havwe one every now and then"  . Alcohol use No       Allergies   Blueberry flavor; Penicillins; Robaxin [methocarbamol]; and Toradol [ketorolac tromethamine]   Review of Systems Review of Systems  Psychiatric/Behavioral:       Hx of ptsd and blackouts, pt states he is able to communicate normally during these spells but he has no recollection during the events  All other systems reviewed and are negative.    Physical Exam Updated Vital Signs BP 140/88   Pulse 72   Temp 98.2 F (36.8 C) (Oral)   Resp 16   Ht 1.803 m (5\' 11" )   Wt 120.2 kg (265 lb)   SpO2 99%   BMI 36.96 kg/m   Physical Exam  Constitutional: No distress.  HENT:  Head: Normocephalic and atraumatic.  Right Ear: External ear normal.  Left Ear: External ear normal.  Eyes: Conjunctivae are normal. Right eye exhibits no discharge. Left eye exhibits no discharge. No scleral icterus.  Neck: Neck supple. No tracheal deviation present.  Cardiovascular: Normal rate, regular rhythm and intact distal pulses.   Pulmonary/Chest: Effort normal and breath sounds normal. No stridor. No respiratory distress. He has no wheezes. He has no rales.  Abdominal: Soft. Bowel sounds are normal. He exhibits no distension. There is no tenderness. There is no rebound and no guarding.  Musculoskeletal: He exhibits no edema or tenderness.  Neurological: He is alert. He has normal strength. No cranial nerve deficit (no facial droop, extraocular movements intact, no slurred speech) or sensory deficit. He exhibits normal muscle tone. He displays no seizure activity. Coordination normal.  Skin: Skin is warm and dry. No rash noted. He is not diaphoretic.  Psychiatric: He has a normal mood and affect.  Nursing note and vitals reviewed.    ED Treatments / Results  Labs (all labs ordered are listed, but only abnormal results are displayed) Labs Reviewed  I-STAT CHEM 8, ED - Abnormal; Notable for the following:       Result Value   Potassium 3.4 (*)    Chloride 96 (*)    BUN <3 (*)     Creatinine, Ser 0.60 (*)    Glucose, Bld 161 (*)    Calcium, Ion 1.07 (*)    All other components within normal limits  I-STAT TROPONIN, ED    EKG  EKG Interpretation  Date/Time:  Monday October 14 2016 07:05:36 EDT Ventricular Rate:  68 PR Interval:    QRS Duration: 108 QT Interval:  423 QTC Calculation: 450 R Axis:   9 Text Interpretation:  Sinus rhythm Left ventricular hypertrophy No significant change since last tracing Confirmed by Linwood Dibbles [id number]) on 10/14/2016 7:11:06 AM  Radiology Dg Chest 2 View  Result Date: 10/14/2016 CLINICAL DATA:  Chest pain EXAM: CHEST  2 VIEW COMPARISON:  September 10, 2016 FINDINGS: The degree of inspiration is shallow. There is no edema or consolidation. Heart is borderline enlarged with pulmonary vascularity within normal limits. No adenopathy. No evident bone lesions. IMPRESSION: Borderline cardiac prominence. Shallow inspiration. No edema or consolidation. Electronically Signed   By: Bretta Bang III M.D.   On: 10/14/2016 08:19    Procedures Procedures (including critical care time)  Medications Ordered in ED Medications  acetaminophen (TYLENOL) tablet 650 mg (not administered)  lisinopril (PRINIVIL,ZESTRIL) tablet 20 mg (not administered)     Initial Impression / Assessment and Plan / ED Course  I have reviewed the triage vital signs and the nursing notes.  Pertinent labs & imaging results that were available during my care of the patient were reviewed by me and considered in my medical decision making (see chart for details).   patient presented to the ED with various complaints including headache and chest discomfort. Patient states that the symptoms are related to his lack of blood pressure medications. He has had similar episodes before. Patient has mild hypertension here in the emergency room. His laboratory tests are normal despite several days of constant symptoms.  His chest pain is not concerning for acute coronary  syndrome, pulmonary embolism, aortic dissection or other emergent condition. Patient's headache seems a benign headache. No focal neurologic symptoms. I doubt stroke, hemorrhage or other emergent condition.  DC home with med refill.  Encouraged follow up with PCP  Final Clinical Impressions(s) / ED Diagnoses   Final diagnoses:  Hypertension, unspecified type  Nonintractable headache, unspecified chronicity pattern, unspecified headache type    New Prescriptions Refilled BP meds   Linwood Dibbles, MD 10/14/16 0830

## 2016-10-14 NOTE — ED Triage Notes (Signed)
Patient from home with "central chest ache" after running out of his medications on Thursday (clonidine,lisinopril, and metoprolol).  Patient states he has felt this pain in the past other times when he ran out of these medications.  States he attempted to refill them but his doctor's office has been closed since Friday.  Received 1x SL nitro with no relief, 324 mg aspirin, and 4mg  zofran for nasuea.  Patient alert and oriented at this time and in no apparent distress.

## 2016-11-23 ENCOUNTER — Emergency Department (HOSPITAL_COMMUNITY)
Admission: EM | Admit: 2016-11-23 | Discharge: 2016-11-23 | Disposition: A | Discharge status: Home / Self Care | Payer: No Typology Code available for payment source | Attending: Emergency Medicine | Admitting: Emergency Medicine

## 2016-11-23 ENCOUNTER — Encounter (HOSPITAL_COMMUNITY): Payer: Self-pay

## 2016-11-23 DIAGNOSIS — I10 Essential (primary) hypertension: Secondary | ICD-10-CM | POA: Insufficient documentation

## 2016-11-23 DIAGNOSIS — Z79899 Other long term (current) drug therapy: Secondary | ICD-10-CM | POA: Insufficient documentation

## 2016-11-23 DIAGNOSIS — Z87891 Personal history of nicotine dependence: Secondary | ICD-10-CM | POA: Insufficient documentation

## 2016-11-23 DIAGNOSIS — Z590 Homelessness: Secondary | ICD-10-CM | POA: Insufficient documentation

## 2016-11-23 DIAGNOSIS — Z760 Encounter for issue of repeat prescription: Secondary | ICD-10-CM

## 2016-11-23 DIAGNOSIS — Z9114 Patient's other noncompliance with medication regimen: Secondary | ICD-10-CM | POA: Insufficient documentation

## 2016-11-23 MED ORDER — METOPROLOL TARTRATE 50 MG PO TABS: 25.0000 mg | ORAL_TABLET | Freq: Every day | ORAL | 1 refills | Status: DC

## 2016-11-23 MED ORDER — CLONIDINE HCL 0.3 MG PO TABS: 0.3000 mg | ORAL_TABLET | Freq: Two times a day (BID) | ORAL | 0 refills | Status: DC

## 2016-11-23 NOTE — ED Triage Notes (Signed)
Pt reports that he ran out of klonopin yesterday morning. He reports that he has tried to contact the [facility] for a refill, but has been unable to get a hold of them. He states that when he is off of it for too long, he gets sick. A&Ox4. Ambulatory,.

## 2016-11-23 NOTE — Discharge Instructions (Signed)
Please follow with your primary care doctor in the next 2 days for a check-up. They must obtain records for further management.  ° °Do not hesitate to return to the Emergency Department for any new, worsening or concerning symptoms.  ° °
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
## 2016-12-28 ENCOUNTER — Encounter (HOSPITAL_COMMUNITY): Payer: Self-pay | Admitting: Emergency Medicine

## 2016-12-28 ENCOUNTER — Emergency Department (HOSPITAL_COMMUNITY)
Admission: EM | Admit: 2016-12-28 | Discharge: 2016-12-28 | Disposition: A | Discharge status: Home / Self Care | Payer: Self-pay | Attending: Emergency Medicine | Admitting: Emergency Medicine

## 2016-12-28 DIAGNOSIS — Z79899 Other long term (current) drug therapy: Secondary | ICD-10-CM | POA: Insufficient documentation

## 2016-12-28 DIAGNOSIS — I10 Essential (primary) hypertension: Secondary | ICD-10-CM | POA: Insufficient documentation

## 2016-12-28 DIAGNOSIS — G43709 Chronic migraine without aura, not intractable, without status migrainosus: Secondary | ICD-10-CM | POA: Insufficient documentation

## 2016-12-28 DIAGNOSIS — G8929 Other chronic pain: Secondary | ICD-10-CM

## 2016-12-28 DIAGNOSIS — Z760 Encounter for issue of repeat prescription: Secondary | ICD-10-CM | POA: Insufficient documentation

## 2016-12-28 DIAGNOSIS — R51 Headache: Secondary | ICD-10-CM

## 2016-12-28 DIAGNOSIS — Z87891 Personal history of nicotine dependence: Secondary | ICD-10-CM | POA: Insufficient documentation

## 2016-12-28 LAB — I-STAT TROPONIN, ED: Troponin i, poc: 0 ng/mL (ref 0.00–0.08)

## 2016-12-28 LAB — I-STAT CHEM 8, ED
BUN: 4 mg/dL — ABNORMAL LOW (ref 6–20)
Calcium, Ion: 1.07 mmol/L — ABNORMAL LOW (ref 1.15–1.40)
Chloride: 98 mmol/L — ABNORMAL LOW (ref 101–111)
Creatinine, Ser: 1 mg/dL (ref 0.61–1.24)
GLUCOSE: 152 mg/dL — AB (ref 65–99)
HEMATOCRIT: 48 % (ref 39.0–52.0)
HEMOGLOBIN: 16.3 g/dL (ref 13.0–17.0)
POTASSIUM: 3.5 mmol/L (ref 3.5–5.1)
SODIUM: 139 mmol/L (ref 135–145)
TCO2: 29 mmol/L (ref 22–32)

## 2016-12-28 MED ORDER — METOPROLOL TARTRATE 50 MG PO TABS: 25.0000 mg | ORAL_TABLET | Freq: Every day | ORAL | 0 refills | Status: DC

## 2016-12-28 MED ORDER — CLONIDINE HCL 0.3 MG PO TABS: 0.3000 mg | ORAL_TABLET | Freq: Two times a day (BID) | ORAL | 0 refills | Status: DC

## 2016-12-28 MED ORDER — AMLODIPINE BESYLATE 5 MG PO TABS: 5.0000 mg | ORAL_TABLET | Freq: Every day | ORAL | 0 refills | Status: DC

## 2016-12-28 MED ORDER — LISINOPRIL 20 MG PO TABS: 20.0000 mg | ORAL_TABLET | Freq: Every day | ORAL | Status: DC

## 2016-12-28 MED ORDER — ACETAMINOPHEN 325 MG PO TABS
650.0000 mg | ORAL_TABLET | Freq: Once | ORAL | Status: AC
  Administered 2016-12-28: 650 mg via ORAL
  Filled 2016-12-28: qty 2

## 2016-12-28 MED ORDER — METOPROLOL TARTRATE 25 MG PO TABS
25.0000 mg | ORAL_TABLET | Freq: Every day | ORAL | Status: DC
  Administered 2016-12-28: 25 mg via ORAL
  Filled 2016-12-28: qty 1

## 2016-12-28 MED ORDER — METOPROLOL TARTRATE 25 MG PO TABS: 12.5000 mg | ORAL_TABLET | Freq: Every day | ORAL | 0 refills | Status: DC

## 2016-12-28 MED ORDER — CLONIDINE HCL 0.2 MG PO TABS
0.3000 mg | ORAL_TABLET | Freq: Two times a day (BID) | ORAL | Status: DC
  Administered 2016-12-28: 0.3 mg via ORAL
  Filled 2016-12-28: qty 1
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
## 2016-12-28 NOTE — ED Provider Notes (Signed)
[facility] Provider Note   CSN: [id number] Arrival date & time: 12/28/16  0451     History   Chief Complaint Chief Complaint  Patient presents with  . Hypertension    HPI Travis [name] is a 51 y.o. male.  HPI 51 year old Caucasian male past medical history significant for anxiety, PTSD, hypertension, polysubstance abuse, obesity, GERD, depression, tobacco use presents to the ED for elevated blood pressure, medication refill, headache.  Patient with history of hypertension.  States he ran out of medicine 3 days ago.  States that he has an appointment with his doctor's office in 2 weeks but he missed his appointment last week and does not have any more medications.  Reports a constant gradual in onset headache.  States that he gets a headache when his blood pressure goes up.  States this is typical of his headaches.  Denies any thunderclap headache, night sweats, vision changes, lightheadedness, dizziness, chest pain, shortness of breath, paresthesias, weakness, emesis, fevers.  Nothing makes better or worse.  He has not tried nothing for her symptoms prior to arrival.  Patient wants a refill of medications states that he takes clonidine and metoprolol.  Pt denies any fever, chill, vision changes, lightheadedness, dizziness, congestion, neck pain, cp, sob, cough, n/v/d, urinary symptoms, change in bowel habits, melena, hematochezia, lower extremity paresthesias.  Past Medical History:  Diagnosis Date  . Anxiety    Panic attacks  . Chest pain    Hospital, March, 2014, negative enzymes, patient refused in-hospital  stress test, patient canceled outpatient stress test  . Constipation   . Degenerative disk disease   . Degenerative disk disease   . Depression   . GERD (gastroesophageal reflux disease)   . Hypertension   . Incontinence of urine   . Neuromuscular disorder (HCC)   . Obesity   . Polysubstance abuse (HCC)   . PTSD (post-traumatic  stress disorder)   . Shortness of breath dyspnea    with exertion  . Spinal stenosis   . Spinal stenosis   . Tachycardia - pulse   . Tobacco abuse     Patient Active Problem List   Diagnosis Date Noted  . ARF (acute renal failure) (HCC) 09/01/2015  . Hypotension 09/01/2015  . Acute encephalopathy 09/01/2015  . Hematochezia 04/07/2014  . 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 Major depressive disorder, single episode, moderate (HCC) 10/12/2013  . Morbid obesity (HCC) 10/12/2013  . Essential hypertension 10/12/2013  . Chronic pain syndrome 10/12/2013  . MDD (major depressive disorder), recurrent episode, severe (HCC) 09/29/2013  . Rectal pain 08/10/2013  . Tachycardia 07/27/2013  . Urine incontinence 07/27/2013  . Rectal bleeding 07/27/2013  . Left arm pain 03/29/2013  . Lumbar radicular pain 03/23/2013  . Bilateral shoulder pain 10/27/2012  . Smoking 10/27/2012  . Dental abscess 07/14/2012  . Hypertension   . Anxiety   . Spinal stenosis   . Tobacco abuse   . Obesity   . Dyslipidemia   . Polysubstance abuse (HCC)   . Chest pain   . Grief 06/11/2012  . Panic attack 01/21/2012  . HYPERCHOLESTEROLEMIA 02/18/2006  . DEPRESSION 02/18/2006  . GERD 02/18/2006  . HEADACHE 02/18/2006    Past Surgical History:  Procedure Laterality Date  . COLONOSCOPY N/A 08/24/2014   Procedure: COLONOSCOPY;  Surgeon: Charlott RakesVincent Schooler, MD;  [facility];  Service: Endoscopy;  Laterality: N/A;  . ESOPHAGOGASTRODUODENOSCOPY (EGD) WITH PROPOFOL N/A 08/24/2014   Procedure: ESOPHAGOGASTRODUODENOSCOPY (EGD) WITH PROPOFOL;  Surgeon: Charlott RakesVincent Schooler,  MD;  Location: [facility] ENDOSCOPY;  Service: Endoscopy;  Laterality: N/A;  . WISDOM TOOTH EXTRACTION         Home Medications    Prior to Admission medications   Medication Sig Start Date End Date Taking? Authorizing Provider  cloNIDine (CATAPRES) 0.3 MG tablet Take 1 tablet (0.3 mg total) by mouth 2 (two) times daily. 12/28/16   Rise Mu, PA-C  lisinopril (PRINIVIL,ZESTRIL) 20 MG tablet Take 1 tablet (20 mg total) by mouth daily. 10/14/16 10/29/16  Linwood Dibbles, MD  metoprolol tartrate (LOPRESSOR) 50 MG tablet Take 0.5 tablets (25 mg total) by mouth at bedtime. 12/28/16   Rise Mu, PA-C  ranitidine (ZANTAC) 150 MG tablet Take 150 mg by mouth at bedtime.     [provider]  traMADol (ULTRAM) 50 MG tablet Take 50 mg by mouth every 6 (six) hours as needed for moderate pain.    [provider]    Family History Family History  Problem Relation Age of Onset  . Stroke Mother   . Hypertension Mother   . Hyperlipidemia Mother   . Stroke Father   . Hypertension Father   . Dementia Father   . Diabetes Father   . Heart disease Father   . Hyperlipidemia Father   . Cancer Sister        brain  . Diabetes Sister   . Heart disease Sister   . Hyperlipidemia Sister   . Hypertension Sister   . Stroke Sister   . Heart disease Brother   . Hyperlipidemia Brother     Social History Social History  Substance Use Topics  . Smoking status: Former Smoker    Packs/day: 0.02    Years: 27.00    Types: Cigarettes    Quit date: 04/05/2015  . Smokeless tobacco: Never Used     Comment: "quit April 2016, I havwe one every now and then"  . Alcohol use No     Allergies   Blueberry flavor; Penicillins; Robaxin [methocarbamol]; and Toradol [ketorolac tromethamine]   Review of Systems Review of Systems  Constitutional: Negative for chills, diaphoresis and fever.  HENT: Negative for congestion.   Eyes: Negative for visual disturbance.  Respiratory: Negative for cough and shortness of breath.   Cardiovascular: Negative for chest pain, palpitations and leg swelling.  Gastrointestinal: Negative for abdominal pain, diarrhea, nausea and vomiting.  Genitourinary: Negative for dysuria, flank pain, frequency, hematuria and urgency.  Musculoskeletal: Negative for arthralgias and myalgias.  Skin: Negative for  rash.  Neurological: Positive for headaches. Negative for dizziness, syncope, weakness, light-headedness and numbness.  Psychiatric/Behavioral: Negative for sleep disturbance. The patient is not nervous/anxious.      Physical Exam Updated Vital Signs BP (!) 147/98   Pulse 82   Temp 98.3 F (36.8 C) (Oral)   Resp 18   SpO2 98%   Physical Exam  Constitutional: He is oriented to person, place, and time. He appears well-developed and well-nourished.  Non-toxic appearance. No distress.  HENT:  Head: Normocephalic and atraumatic.  Nose: Nose normal.  Mouth/Throat: Oropharynx is clear and moist.  Eyes: Pupils are equal, round, and reactive to light. Conjunctivae and EOM are normal. Right eye exhibits no discharge. Left eye exhibits no discharge.  Neck: Normal range of motion. Neck supple. No JVD present. No tracheal deviation present.  Cardiovascular: Normal rate, regular rhythm, normal heart sounds and intact distal pulses.  Exam reveals no gallop and no friction rub.   No murmur heard. Pulmonary/Chest: Effort normal  and breath sounds normal. No respiratory distress. He has no wheezes. He has no rales. He exhibits no tenderness.  No hypoxia or tachypnea.  Abdominal: Soft. Bowel sounds are normal. He exhibits no distension. There is no tenderness. There is no rebound and no guarding.  Musculoskeletal: Normal range of motion.  No lower extremity edema or calf tenderness.  Lymphadenopathy:    He has no cervical adenopathy.  Neurological: He is alert and oriented to person, place, and time.  The patient is alert, attentive, and oriented x 3. Speech is clear. Cranial nerve II-VII grossly intact. Negative pronator drift. Sensation intact. Strength 5/5 in all extremities. Reflexes 2+ and symmetric at biceps, triceps, knees, and ankles. Rapid alternating movement and fine finger movements intact. Romberg is absent. Posture and gait normal.  Skin: Skin is warm and dry. Capillary refill takes less  than 2 seconds. He is not diaphoretic.  Psychiatric: His behavior is normal. Judgment and thought content normal.  Nursing note and vitals reviewed.    ED Treatments / Results  Labs (all labs ordered are listed, but only abnormal results are displayed) Labs Reviewed  I-STAT CHEM 8, ED - Abnormal; Notable for the following:       Result Value   Chloride 98 (*)    BUN 4 (*)    Glucose, Bld 152 (*)    Calcium, Ion 1.07 (*)    All other components within normal limits  I-STAT TROPONIN, ED    EKG  EKG Interpretation  Date/Time:  Saturday December 28 2016 10:34:09 EDT Ventricular Rate:  55 PR Interval:    QRS Duration: 98 QT Interval:  451 QTC Calculation: 432 R Axis:   7 Text Interpretation:  Normal sinus rhythm Abnormal R-wave progression, early transition Minimal ST depression Baseline wander in lead(s) V4 No significant change since last tracing Confirmed by Alvira Monday ([id number]) on 12/28/2016 10:50:41 AM Also confirmed by Alvira Monday ([id number]), editor Madalyn Rob [phone])  on 12/28/2016 10:54:46 AM       Radiology No results found.  Procedures Procedures (including critical care time)  Medications Ordered in ED Medications  cloNIDine (CATAPRES) tablet 0.3 mg (0.3 mg Oral Given 12/28/16 1034)  metoprolol tartrate (LOPRESSOR) tablet 25 mg (25 mg Oral Given 12/28/16 1034)  acetaminophen (TYLENOL) tablet 650 mg (650 mg Oral Given 12/28/16 1034)     Initial Impression / Assessment and Plan / ED Course  I have reviewed the triage vital signs and the nursing notes.  Pertinent labs & imaging results that were available during my care of the patient were reviewed by me and considered in my medical decision making (see chart for details).     Patient presents to the ED with complaints of high blood pressure after being out of his blood pressure medicine for 3 days and asking for refill.  Reports associated headache.  Patient states this is similar to his  headaches in the past when his blood pressure is elevated and has no change or red flag symptoms.  His laboratory tests are normal despite several days of constant symptoms.  Patient is mildly hypertensive here in the emergency room.  Patient has no chest pain that would be concerning for ACS, PE, aortic dissection or other emergent condition.  Patient headache seems benign and has no focal neuro deficits.  Low suspicion for CVA, SAH, intracranial hemorrhage or any other emergent condition.  Patient's EKG shows no ischemic changes.  Patient heart rate stays between the lower 50s to the upper 80s.  Has been off his medicine for 3 days.  Patient states that he does not take lisinopril anymore as he was told that for his kidneys.  He is just taking metoprolol and clonidine.  Concern that patient may be slightly bradycardic to the metoprolol and clonidine.  States he does not want to take lisinopril.  Will decrease his metoprolol in half to 12.5.  Continue the clonidine will add on 5 mg of Norvasc.  Patient has an appointment with his PCP in 2 weeks.  We will give him a refill for the medication until he can see his PCP in 2 weeks.  Patient was given Tylenol at his request and the headache has completely resolved.  Continues to have no focal neuro deficits.  Blood pressure has slightly improved.  Pt is hemodynamically stable, in NAD, & able to ambulate in the ED. Evaluation does not show pathology that would require ongoing emergent intervention or inpatient treatment. I explained the diagnosis to the patient. Pain has been managed & has no complaints prior to dc. Pt is comfortable with above plan and is stable for discharge at this time. All questions were answered prior to disposition. Strict return precautions for f/u to the ED were discussed. Encouraged follow up with PCP.  Patient was discussed with my attending Dr. Dalene Seltzer  who was agreeable the above plan.  Final Clinical Impressions(s) / ED Diagnoses    Final diagnoses:  Hypertension, unspecified type  Chronic nonintractable headache, unspecified headache type  Medication refill    New Prescriptions Current Discharge Medication List       Wallace Keller 12/28/16 2226    Alvira Monday, MD 12/31/16 2115

## 2016-12-28 NOTE — Discharge Instructions (Signed)
Lab work and EKG is reassuring.  Take your blood pressure medicine as prescribed.  I would like for you to take half of the metoprolol.  Continue taking the clonidine.  I have also started you on Norvasc 1 time a day.  Have given you a short course of the follow-up with your primary care doctor.  Return to the ED with any worsening symptoms.  It is very important that you follow-up with your primary care doctor.

## 2017-01-15 ENCOUNTER — Emergency Department (HOSPITAL_COMMUNITY): Payer: Self-pay

## 2017-01-15 ENCOUNTER — Other Ambulatory Visit: Payer: Self-pay

## 2017-01-15 ENCOUNTER — Encounter (HOSPITAL_COMMUNITY): Payer: Self-pay | Admitting: Emergency Medicine

## 2017-01-15 ENCOUNTER — Emergency Department (HOSPITAL_COMMUNITY)
Admission: EM | Admit: 2017-01-15 | Discharge: 2017-01-15 | Disposition: A | Discharge status: Home / Self Care | Payer: Self-pay | Attending: Emergency Medicine | Admitting: Emergency Medicine

## 2017-01-15 DIAGNOSIS — I10 Essential (primary) hypertension: Secondary | ICD-10-CM | POA: Insufficient documentation

## 2017-01-15 DIAGNOSIS — R0789 Other chest pain: Secondary | ICD-10-CM | POA: Insufficient documentation

## 2017-01-15 DIAGNOSIS — Z87891 Personal history of nicotine dependence: Secondary | ICD-10-CM | POA: Insufficient documentation

## 2017-01-15 DIAGNOSIS — Z79899 Other long term (current) drug therapy: Secondary | ICD-10-CM | POA: Insufficient documentation

## 2017-01-15 LAB — BASIC METABOLIC PANEL
Anion gap: 8 (ref 5–15)
CHLORIDE: 100 mmol/L — AB (ref 101–111)
CO2: 28 mmol/L (ref 22–32)
Calcium: 8.5 mg/dL — ABNORMAL LOW (ref 8.9–10.3)
Creatinine, Ser: 0.85 mg/dL (ref 0.61–1.24)
GFR calc Af Amer: >60 mL/min (ref >60)
GFR calc non Af Amer: >60 mL/min (ref >60)
GLUCOSE: 105 mg/dL — AB (ref 65–99)
POTASSIUM: 2.8 mmol/L — AB (ref 3.5–5.1)
Sodium: 136 mmol/L (ref 135–145)

## 2017-01-15 LAB — CBC
HCT: 45.3 % (ref 39.0–52.0)
Hemoglobin: 15.4 g/dL (ref 13.0–17.0)
MCH: 29.4 pg (ref 26.0–34.0)
MCHC: 34 g/dL (ref 30.0–36.0)
MCV: 86.6 fL (ref 78.0–100.0)
Platelets: 167 10*3/uL (ref 150–400)
RBC: 5.23 MIL/uL (ref 4.22–5.81)
RDW: 13.4 % (ref 11.5–15.5)
WBC: 10.8 10*3/uL — ABNORMAL HIGH (ref 4.0–10.5)

## 2017-01-15 LAB — I-STAT TROPONIN, ED
Troponin i, poc: 0 ng/mL (ref 0.00–0.08)
Troponin i, poc: 0 ng/mL (ref 0.00–0.08)

## 2017-01-15 MED ORDER — CLONIDINE HCL 0.1 MG PO TABS
0.3000 mg | ORAL_TABLET | Freq: Once | ORAL | Status: AC
  Administered 2017-01-15: 12:00:00 0.3 mg via ORAL
  Filled 2017-01-15: qty 1

## 2017-01-15 MED ORDER — AMLODIPINE BESYLATE 5 MG PO TABS: 5.0000 mg | ORAL_TABLET | Freq: Every day | ORAL | 0 refills | Status: DC

## 2017-01-15 MED ORDER — CLONIDINE HCL 0.3 MG PO TABS: 0.3000 mg | ORAL_TABLET | Freq: Two times a day (BID) | ORAL | 0 refills | Status: DC

## 2017-01-15 MED ORDER — POTASSIUM CHLORIDE 10 MEQ/100ML IV SOLN
10.0000 meq | INTRAVENOUS | Status: AC
  Administered 2017-01-15 (×2): 10 meq via INTRAVENOUS
  Filled 2017-01-15 (×3): qty 100

## 2017-01-15 MED ORDER — ONDANSETRON HCL 4 MG/2ML IJ SOLN
4.0000 mg | Freq: Once | INTRAMUSCULAR | Status: AC
  Administered 2017-01-15: 4 mg via INTRAVENOUS
  Filled 2017-01-15: qty 2

## 2017-01-15 MED ORDER — ACETAMINOPHEN 500 MG PO TABS
1000.0000 mg | ORAL_TABLET | Freq: Once | ORAL | Status: AC
  Administered 2017-01-15: 1000 mg via ORAL
  Filled 2017-01-15: qty 2

## 2017-01-15 NOTE — ED Triage Notes (Signed)
Patient reports intermittent pain across his chest onset yesterday with SOB and emesis , denies cough or diaphoresis .

## 2017-01-15 NOTE — Discharge Instructions (Signed)
Return here as needed.  Follow-up with your primary doctor.  [facility] get your [facility].

## 2017-01-15 NOTE — ED Notes (Signed)
Pt reports running out of his BP medications three days ago. Yesterday, Pt started to have some chest pain, nausea, and one episode of vomiting.

## 2017-01-20 NOTE — ED Provider Notes (Signed)
[facility]   CSN: [id number] Arrival date & time: 01/15/17  [id number]     History   Chief Complaint Chief Complaint  Patient presents with  . Chest Pain    HPI Travis LimboJames [name] is a 51 y.o. male.  HPI Patient presents to the emergency department with complaints of running out of his blood pressure medications 3 days ago he states that all he needs a refill of his clonidine he states that he did have some chest pressure with nausea and one episode of vomiting yesterday he states that the days that having this pain.  The patient states that nothing seemed to make the condition better or worse he states that he feels like he needs his blood pressure medications and he will feel better.  The patient denies shortness of breath, headache,blurred vision, neck pain, fever, cough, weakness, numbness, dizziness, anorexia, edema, abdominal pain, diarrhea, rash, back pain, dysuria, hematemesis, bloody stool, near syncope, or syncope. Past Medical History:  Diagnosis Date  . Anxiety    Panic attacks  . Chest pain    Hospital, March, 2014, negative enzymes, patient refused in-hospital  stress test, patient canceled outpatient stress test  . Constipation   . Degenerative disk disease   . Degenerative disk disease   . Depression   . GERD (gastroesophageal reflux disease)   . Hypertension   . Incontinence of urine   . Neuromuscular disorder (HCC)   . Obesity   . Polysubstance abuse (HCC)   . PTSD (post-traumatic stress disorder)   . Shortness of breath dyspnea    with exertion  . Spinal stenosis   . Spinal stenosis   . Tachycardia - pulse   . Tobacco abuse     Patient Active Problem List   Diagnosis Date Noted  . ARF (acute renal failure) (HCC) 09/01/2015  . Hypotension 09/01/2015  . Acute encephalopathy 09/01/2015  . Hematochezia 04/07/2014  . 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 Major depressive disorder, single episode, moderate  (HCC) 10/12/2013  . Morbid obesity (HCC) 10/12/2013  . Essential hypertension 10/12/2013  . Chronic pain syndrome 10/12/2013  . MDD (major depressive disorder), recurrent episode, severe (HCC) 09/29/2013  . Rectal pain 08/10/2013  . Tachycardia 07/27/2013  . Urine incontinence 07/27/2013  . Rectal bleeding 07/27/2013  . Left arm pain 03/29/2013  . Lumbar radicular pain 03/23/2013  . Bilateral shoulder pain 10/27/2012  . Smoking 10/27/2012  . Dental abscess 07/14/2012  . Hypertension   . Anxiety   . Spinal stenosis   . Tobacco abuse   . Obesity   . Dyslipidemia   . Polysubstance abuse (HCC)   . Chest pain   . Grief 06/11/2012  . Panic attack 01/21/2012  . HYPERCHOLESTEROLEMIA 02/18/2006  . DEPRESSION 02/18/2006  . GERD 02/18/2006  . HEADACHE 02/18/2006    Past Surgical History:  Procedure Laterality Date  . COLONOSCOPY N/A 08/24/2014   [facility]ott RakesSchooler, Vincent[facility]  . ESOPHAGOGASTRODUODENOSCOPY (EGD) WITH PROPOFOL N/A 08/24/2014   [facility]School[facility] Vincent[facility]  . WISDOM TOOTH EXTRACTION         Home Medications    Prior to Admission medications   Medication Sig Start Date End Date Taking? Authorizing Provider  metoprolol tartrate (LOPRESSOR) 50 MG tablet Take 25 mg daily by mouth.   Yes [provider]  ranitidine (ZANTAC) 150 MG tablet Take 150 mg by mouth at bedtime.    Yes [provider]  traMADol (ULTRAM) 50 MG tablet Take 50 mg by mouth every 6 (six) hours as needed for moderate pain.   Yes [provider]  amLODipine (NORVASC) 5 MG tablet Take 1 tablet (5 mg total) daily by mouth. 01/15/17   Jahzier Villalon, Cristal Deerhristopher, PA-C  cloNIDine (CATAPRES) 0.3 MG tablet Take 1 tablet (0.3 mg total) 2 (two) times daily by mouth. 01/15/17   Odel Schmid, Cristal Deerhristopher, PA-C  lisinopril (PRINIVIL,ZESTRIL) 20 MG tablet Take 1 tablet (20 mg total) by mouth daily. 10/14/16 10/29/16  Linwood DibblesKnapp, Jon, MD    Family History Family  History  Problem Relation Age of Onset  . Stroke Mother   . Hypertension Mother   . Hyperlipidemia Mother   . Stroke Father   . Hypertension Father   . Dementia Father   . Diabetes Father   . Heart disease Father   . Hyperlipidemia Father   . Cancer Sister        brain  . Diabetes Sister   . Heart disease Sister   . Hyperlipidemia Sister   . Hypertension Sister   . Stroke Sister   . Heart disease Brother   . Hyperlipidemia Brother     Social History Social History   Tobacco Use  . Smoking status: Former Smoker    Packs/day: 0.02    Years: 27.00    Pack years: 0.54    Types: Cigarettes    Last attempt to quit: 04/05/2015    Years since quitting: 1.7  . Smokeless tobacco: Never Used  . Tobacco comment: "quit April 2016, I havwe one every now and then"  Substance Use Topics  . Alcohol use: No  . Drug use: No     Allergies   Blueberry flavor; Penicillins; Robaxin [methocarbamol]; and Toradol [ketorolac tromethamine]   Review of Systems Review of Systems All other systems negative except as documented in the HPI. All pertinent positives and negatives as reviewed in the HPI.  Physical Exam Updated Vital Signs BP (!) 146/103   Pulse 67   Temp 97.9 F (36.6 C) (Oral)   Resp 13   Ht 5\' 11"  (1.803 m)   Wt 120.2 kg (265 lb)   SpO2 96%   BMI 36.96 kg/m   Physical Exam  Constitutional: He is oriented to person, place, and time. He appears well-developed and well-nourished. No distress.  HENT:  Head: Normocephalic and atraumatic.  Mouth/Throat: Oropharynx is clear and moist.  Eyes: Pupils are equal, round, and reactive to light.  Neck: Normal range of motion. Neck supple.  Cardiovascular: Normal rate, regular rhythm and normal heart sounds. Exam reveals no gallop and no friction rub.  No murmur heard. Pulmonary/Chest: Effort normal and breath sounds normal. No respiratory distress. He has no wheezes.  Abdominal: Soft. Bowel sounds are normal. He exhibits no  distension. There is no tenderness.  Neurological: He is alert and oriented to person, place, and time. He exhibits normal muscle tone. Coordination normal.  Skin: Skin is warm and dry. Capillary refill takes less than 2 seconds. No rash noted. No erythema.  Psychiatric: He has a normal mood and affect. His behavior is normal.  Nursing note and vitals reviewed.    ED Treatments / Results  Labs (all labs ordered are listed, but only abnormal results are displayed) Labs Reviewed  BASIC METABOLIC PANEL - Abnormal; Notable for the following components:      Result Value   Potassium 2.8 (*)    Chloride 100 (*)    Glucose, Bld 105 (*)  BUN <5 (*)    Calcium 8.5 (*)    All other components within normal limits  CBC - Abnormal; Notable for the following components:   WBC 10.8 (*)    All other components within normal limits  I-STAT TROPONIN, ED  I-STAT TROPONIN, ED    EKG  EKG Interpretation  Date/Time:  Wednesday January 15 2017 06:13:13 EST Ventricular Rate:  59 PR Interval:  200 QRS Duration: 98 QT Interval:  456 QTC Calculation: 451 R Axis:   5 Text Interpretation:  Sinus bradycardia Otherwise normal ECG No significant change since last tracing Confirmed by Richardean Canal [phone]) on 01/15/2017 7:55:44 AM       Radiology No results found.  Procedures Procedures (including critical care time)  Medications Ordered in ED Medications  ondansetron (ZOFRAN) injection 4 mg (4 mg Intravenous Given 01/15/17 0827)  potassium chloride 10 mEq in 100 mL IVPB (0 mEq Intravenous Stopped 01/15/17 1153)  acetaminophen (TYLENOL) tablet 1,000 mg (1,000 mg Oral Given 01/15/17 1134)  cloNIDine (CATAPRES) tablet 0.3 mg (0.3 mg Oral Given 01/15/17 1134)  Patient states that he is only here to have his blood pressure medications refilled.  He does have an appointment with his doctor coming up.  I did advise him to go to the pharmacy at the [facility] to have his medications refilled.  The patient  agrees with plan and all questions were answered patient's workup here in the emergency department is negative he would like to be discharged home at this time.  Patient does not have any signs of hypertensive crisis or urgency at this time.   Initial Impression / Assessment and Plan / ED Course  I have reviewed the triage vital signs and the nursing notes.  Pertinent labs & imaging results that were available during my care of the patient were reviewed by me and considered in my medical decision making (see chart for details).    Patient has hypertension and mainly wants refill of his medication.  Patient advised to return here as needed I do not feel patient warrants any further cardiac workup at this time.  Final Clinical Impressions(s) / ED Diagnoses   Final diagnoses:  Atypical chest pain  Essential hypertension    ED Discharge Orders        Ordered    amLODipine (NORVASC) 5 MG tablet  Daily     01/15/17 1306    cloNIDine (CATAPRES) 0.3 MG tablet  2 times daily     01/15/17 [address], PA-C 01/20/17 1621    Charlynne Pander, MD 01/22/17 [phone]

## 2017-05-24 ENCOUNTER — Other Ambulatory Visit: Payer: Self-pay

## 2017-05-24 ENCOUNTER — Encounter (HOSPITAL_COMMUNITY): Payer: Self-pay

## 2017-05-24 ENCOUNTER — Emergency Department (HOSPITAL_COMMUNITY)
Admission: EM | Admit: 2017-05-24 | Discharge: 2017-05-24 | Disposition: A | Discharge status: Home / Self Care | Payer: Self-pay | Attending: Emergency Medicine | Admitting: Emergency Medicine

## 2017-05-24 DIAGNOSIS — I10 Essential (primary) hypertension: Secondary | ICD-10-CM | POA: Insufficient documentation

## 2017-05-24 DIAGNOSIS — Z87891 Personal history of nicotine dependence: Secondary | ICD-10-CM | POA: Insufficient documentation

## 2017-05-24 DIAGNOSIS — F4320 Adjustment disorder, unspecified: Secondary | ICD-10-CM | POA: Insufficient documentation

## 2017-05-24 DIAGNOSIS — Z79899 Other long term (current) drug therapy: Secondary | ICD-10-CM | POA: Insufficient documentation

## 2017-05-24 DIAGNOSIS — F419 Anxiety disorder, unspecified: Secondary | ICD-10-CM | POA: Insufficient documentation

## 2017-05-24 DIAGNOSIS — E876 Hypokalemia: Secondary | ICD-10-CM | POA: Insufficient documentation

## 2017-05-24 LAB — COMPREHENSIVE METABOLIC PANEL
ALT: 17 U/L (ref 17–63)
AST: 25 U/L (ref 15–41)
Albumin: 3.6 g/dL (ref 3.5–5.0)
Alkaline Phosphatase: 79 U/L (ref 38–126)
Anion gap: 9 (ref 5–15)
BILIRUBIN TOTAL: 1 mg/dL (ref 0.3–1.2)
BUN: 6 mg/dL (ref 6–20)
CO2: 29 mmol/L (ref 22–32)
CREATININE: 1.15 mg/dL (ref 0.61–1.24)
Calcium: 8.5 mg/dL — ABNORMAL LOW (ref 8.9–10.3)
Chloride: 98 mmol/L — ABNORMAL LOW (ref 101–111)
GFR calc Af Amer: >60 mL/min (ref >60)
GLUCOSE: 116 mg/dL — AB (ref 65–99)
Potassium: 2.7 mmol/L — CL (ref 3.5–5.1)
Sodium: 136 mmol/L (ref 135–145)
Total Protein: 7.5 g/dL (ref 6.5–8.1)

## 2017-05-24 LAB — RAPID URINE DRUG SCREEN, HOSP PERFORMED
AMPHETAMINES: POSITIVE — AB
BARBITURATES: POSITIVE — AB
Benzodiazepines: POSITIVE — AB
Cocaine: NOT DETECTED
Opiates: NOT DETECTED
Tetrahydrocannabinol: NOT DETECTED

## 2017-05-24 LAB — SALICYLATE LEVEL: Salicylate Lvl: <7 mg/dL (ref 2.8–30.0)

## 2017-05-24 LAB — CBC
HEMATOCRIT: 41.8 % (ref 39.0–52.0)
Hemoglobin: 14.7 g/dL (ref 13.0–17.0)
MCH: 30 pg (ref 26.0–34.0)
MCHC: 35.2 g/dL (ref 30.0–36.0)
MCV: 85.3 fL (ref 78.0–100.0)
PLATELETS: 202 10*3/uL (ref 150–400)
RBC: 4.9 MIL/uL (ref 4.22–5.81)
RDW: 12.9 % (ref 11.5–15.5)
WBC: 8.3 10*3/uL (ref 4.0–10.5)

## 2017-05-24 LAB — ACETAMINOPHEN LEVEL: Acetaminophen (Tylenol), Serum: <10 ug/mL — ABNORMAL LOW (ref 10–30)

## 2017-05-24 LAB — ETHANOL

## 2017-05-24 LAB — CBG MONITORING, ED: GLUCOSE-CAPILLARY: 100 mg/dL — AB (ref 65–99)

## 2017-05-24 MED ORDER — POTASSIUM CHLORIDE CRYS ER 20 MEQ PO TBCR
40.0000 meq | EXTENDED_RELEASE_TABLET | Freq: Once | ORAL | Status: AC
  Administered 2017-05-24: 40 meq via ORAL
  Filled 2017-05-24: qty 2

## 2017-05-24 MED ORDER — TRAMADOL HCL 50 MG PO TABS
50.0000 mg | ORAL_TABLET | Freq: Once | ORAL | Status: AC
  Administered 2017-05-24: 50 mg via ORAL
  Filled 2017-05-24: qty 1

## 2017-05-24 NOTE — ED Provider Notes (Signed)
[facility]-EMERGENCY DEPT Provider Note   CSN: [id number] Arrival date & time: 05/24/17  1417     History   Chief Complaint Chief Complaint  Patient presents with  . Ingestion    Lopressor 20x 20mg  tablets    HPI Travis [name] is a 52 y.o. male.  He presents by EMS for evaluation of intentional overdose of Lopressor.  I saw the patient at 15:15, and he states he did not take an overdose of any medicine but took his usual medicines this morning.  He states that his mother has been aggravating him so he wanted to "scare her."  He told her he took an overdose, and she called EMS.  He states he has problems with his mother has dementia and he is still with helping her in lieu of his siblings who do not help.  He states that he has an upcoming appointment with a therapist to reinitiate care for ongoing anxiety.  He has been treated with anti-depressants in the past but not currently taking them.  He denies recent fever, chills, nausea, vomiting, cough, shortness of breath, weakness or dizziness.  There are no other known modifying factors.    HPI  Past Medical History:  Diagnosis Date  . Anxiety    Panic attacks  . Chest pain    Hospital, March, 2014, negative enzymes, patient refused in-hospital  stress test, patient canceled outpatient stress test  . Constipation   . Degenerative disk disease   . Degenerative disk disease   . Depression   . GERD (gastroesophageal reflux disease)   . Hypertension   . Incontinence of urine   . Neuromuscular disorder (HCC)   . Obesity   . Polysubstance abuse (HCC)   . PTSD (post-traumatic stress disorder)   . Shortness of breath dyspnea    with exertion  . Spinal stenosis   . Spinal stenosis   . Tachycardia - pulse   . Tobacco abuse     Patient Active Problem List   Diagnosis Date Noted  . ARF (acute renal failure) (HCC) 09/01/2015  . Hypotension 09/01/2015  . Acute encephalopathy 09/01/2015  . Hematochezia  04/07/2014  . 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 Major depressive disorder, single episode, moderate (HCC) 10/12/2013  . Morbid obesity (HCC) 10/12/2013  . Essential hypertension 10/12/2013  . Chronic pain syndrome 10/12/2013  . MDD (major depressive disorder), recurrent episode, severe (HCC) 09/29/2013  . Rectal pain 08/10/2013  . Tachycardia 07/27/2013  . Urine incontinence 07/27/2013  . Rectal bleeding 07/27/2013  . Left arm pain 03/29/2013  . Lumbar radicular pain 03/23/2013  . Bilateral shoulder pain 10/27/2012  . Smoking 10/27/2012  . Dental abscess 07/14/2012  . Hypertension   . Anxiety   . Spinal stenosis   . Tobacco abuse   . Obesity   . Dyslipidemia   . Polysubstance abuse (HCC)   . Chest pain   . Grief 06/11/2012  . Panic attack 01/21/2012  . HYPERCHOLESTEROLEMIA 02/18/2006  . DEPRESSION 02/18/2006  . GERD 02/18/2006  . HEADACHE 02/18/2006    Past Surgical History:  Procedure Laterality Date  . COLONOSCOPY N/A 08/24/2014   Procedure: COLONOSCOPY;  Surgeon: Charlott Rakes, MD;  Location: [facility] ENDOSCOPY;  Service: Endoscopy;  Laterality: N/A;  . ESOPHAGOGASTRODUODENOSCOPY (EGD) WITH PROPOFOL N/A 08/24/2014   Procedure: ESOPHAGOGASTRODUODENOSCOPY (EGD) WITH PROPOFOL;  Surgeon: Charlott Rakes, MD;  Location: [facility] ENDOSCOPY;  Service: Endoscopy;  Laterality: N/A;  . WISDOM TOOTH EXTRACTION  Home Medications    Prior to Admission medications   Medication Sig Start Date End Date Taking? Authorizing Provider  cloNIDine (CATAPRES) 0.3 MG tablet Take 1 tablet (0.3 mg total) 2 (two) times daily by mouth. 01/15/17  Yes Lawyer, Cristal Deer, PA-C  ibuprofen (ADVIL,MOTRIN) 600 MG tablet Take 600 mg by mouth every 6 (six) hours as needed for moderate pain.   Yes [provider]  lisinopril (PRINIVIL,ZESTRIL) 20 MG tablet Take 1 tablet (20 mg total) by mouth daily. 10/14/16 05/24/17 Yes Linwood Dibbles, MD  metoprolol tartrate (LOPRESSOR) 50 MG tablet Take 25  mg daily by mouth.   Yes [provider]  Omega-3 Fatty Acids (FISH OIL PO) Take 1 tablet by mouth daily.   Yes [provider]  ranitidine (ZANTAC) 150 MG tablet Take 150 mg by mouth at bedtime.    Yes [provider]  traMADol (ULTRAM) 50 MG tablet Take 50 mg by mouth every 6 (six) hours as needed for moderate pain.   Yes [provider]  VITAMIN D, ERGOCALCIFEROL, PO Take 1 capsule by mouth daily.   Yes [provider]  amLODipine (NORVASC) 5 MG tablet Take 1 tablet (5 mg total) daily by mouth. Patient not taking: Reported on 05/24/2017 01/15/17   Charlestine Night, PA-C    Family History Family History  Problem Relation Age of Onset  . Stroke Mother   . Hypertension Mother   . Hyperlipidemia Mother   . Stroke Father   . Hypertension Father   . Dementia Father   . Diabetes Father   . Heart disease Father   . Hyperlipidemia Father   . Cancer Sister        brain  . Diabetes Sister   . Heart disease Sister   . Hyperlipidemia Sister   . Hypertension Sister   . Stroke Sister   . Heart disease Brother   . Hyperlipidemia Brother     Social History Social History   Tobacco Use  . Smoking status: Former Smoker    Packs/day: 0.02    Years: 27.00    Pack years: 0.54    Types: Cigarettes    Last attempt to quit: 04/05/2015    Years since quitting: 2.1  . Smokeless tobacco: Never Used  . Tobacco comment: "quit April 2016, I havwe one every now and then"  Substance Use Topics  . Alcohol use: No  . Drug use: No     Allergies   Blueberry flavor; Penicillins; Robaxin [methocarbamol]; and Toradol [ketorolac tromethamine]   Review of Systems Review of Systems  All other systems reviewed and are negative.    Physical Exam Updated Vital Signs BP 112/81   Pulse 60   Temp 97.8 F (36.6 C) (Oral)   Resp 13   Ht 6\' 2"  (1.88 m)   Wt 117.9 kg (260 lb)   SpO2 99%   BMI 33.38 kg/m   Physical Exam  Constitutional: He is  oriented to person, place, and time. He appears well-developed and well-nourished. No distress.  HENT:  Head: Normocephalic and atraumatic.  Right Ear: External ear normal.  Left Ear: External ear normal.  Eyes: Pupils are equal, round, and reactive to light. Conjunctivae and EOM are normal.  Neck: Normal range of motion and phonation normal. Neck supple.  Cardiovascular: Normal rate, regular rhythm and normal heart sounds.  Pulmonary/Chest: Effort normal and breath sounds normal. He exhibits no bony tenderness.  Abdominal: Soft. There is no tenderness.  Musculoskeletal: Normal range of motion.  Neurological: He is alert and oriented to person, place, and time. No cranial nerve deficit or sensory deficit. He exhibits normal muscle tone. Coordination normal.  No dysarthria, aphasia or nystagmus.  Skin: Skin is warm, dry and intact.  Psychiatric: He has a normal mood and affect. His behavior is normal. Judgment and thought content normal.  He is not responding to internal stimuli  Nursing note and vitals reviewed.    ED Treatments / Results  Labs (all labs ordered are listed, but only abnormal results are displayed) Labs Reviewed  COMPREHENSIVE METABOLIC PANEL - Abnormal; Notable for the following components:      Result Value   Potassium 2.7 (*)    Chloride 98 (*)    Glucose, Bld 116 (*)    Calcium 8.5 (*)    All other components within normal limits  ACETAMINOPHEN LEVEL - Abnormal; Notable for the following components:   Acetaminophen (Tylenol), Serum <10 (*)    All other components within normal limits  RAPID URINE DRUG SCREEN, HOSP PERFORMED - Abnormal; Notable for the following components:   Benzodiazepines POSITIVE (*)    Amphetamines POSITIVE (*)    Barbiturates POSITIVE (*)    All other components within normal limits  CBG MONITORING, ED - Abnormal; Notable for the following components:   Glucose-Capillary 100 (*)    All other components within normal limits  ETHANOL    SALICYLATE LEVEL  CBC    EKG EKG Interpretation  Date/Time:  Saturday May 24 2017 14:25:10 EDT Ventricular Rate:  61 PR Interval:    QRS Duration: 105 QT Interval:  398 QTC Calculation: 401 R Axis:   -11 Text Interpretation:  Sinus rhythm Borderline prolonged PR interval Abnormal R-wave progression, early transition Borderline T abnormalities, diffuse leads since last tracing no significant change Confirmed by Mancel BaleWentz, [place] [phone]([id number]) on 05/24/2017 4:01:04 PM   Radiology No results found.  Procedures Procedures (including critical care time)  Medications Ordered in ED Medications  traMADol (ULTRAM) tablet 50 mg (50 mg Oral Given 05/24/17 1625)  potassium chloride SA (K-DUR,KLOR-CON) CR tablet 40 mEq (40 mEq Oral Given 05/24/17 1626)     Initial Impression / Assessment and Plan / ED Course  I have reviewed the triage vital signs and the nursing notes.  Pertinent labs & imaging results that were available during my care of the patient were reviewed by me and considered in my medical decision making (see chart for details).  Clinical Course as of May 25 1806  Sat May 24, 2017  1530 History and physical, completed.  Nontoxic patient without evidence for acute inappropriate drug ingestion.   [EW]    Clinical Course User Index [EW] Mancel BaleWentz, Shanedra Lave, MD     Patient Vitals for the past 24 hrs:  BP Temp Temp src Pulse Resp SpO2 Height Weight  05/24/17 1630 112/81 - - 60 13 99 % - -  05/24/17 1615 122/88 - - (!) 58 19 99 % - -  05/24/17 1600 109/86 - - (!) 57 15 98 % - -  05/24/17 1545 108/85 - - (!) 56 15 99 % - -  05/24/17 1530 (!) 111/96 - - (!) 58 19 99 % - -  05/24/17 1515 131/90 - - (!) 58 (!) 22 100 % - -  05/24/17 1500 (!) 118/91 - - (!) 58 14 100 % - -  05/24/17 1458 112/83 - - (!) 59 18 99 % - -  05/24/17 1426 (!) 141/98 97.8 F (36.6 C) Oral 63 16  98 % - -  05/24/17 1425 - - - - - - 6\' 2"  (1.88 m) 117.9 kg (260 lb)  05/24/17 1420 - - - - - 98 % - -    4:51  PM Reevaluation with update and discussion. After initial assessment and treatment, an updated evaluation reveals he is comfortable not eating and drinking well.  He reports that he takes potassium because of ongoing nonspecific hypokalemia.  He has plenty of potassium pills at home, and plans to follow-up with his PCP for checkup in 5 days.  He continues to deny suicidal attempt or suicidal ideation.  Findings discussed with the patient and all questions were answered. Mancel Bale     Final Clinical Impressions(s) / ED Diagnoses   Final diagnoses:  Adjustment disorder, unspecified type  Hypokalemia     Patient with feigned suicide attempt stating to his mother, that he is taking pills, to get a reaction out of her.  No suicidal attempt, or unstable psychiatric condition.  Patient is committed to following up with his PCP and seeking therapy, and there is no indication for hospitalization at this time  Nursing Notes Reviewed/ Care Coordinated Applicable Imaging Reviewed Interpretation of Laboratory Data incorporated into ED treatment  The patient appears reasonably screened and/or stabilized for discharge and I doubt any other medical condition or other [facility] requiring further screening, evaluation, or treatment in the ED at this time prior to discharge.  Plan: Home Medications-continue current medications, double up on potassium for 1 week.; Home Treatments-improve oral intake, and take 1 banana a day; return here if the recommended treatment, does not improve the symptoms; Recommended follow up-PCP follow-up for 5 days.     ED Discharge Orders    None       Mancel Bale, MD 05/24/17 1810

## 2017-05-24 NOTE — ED Triage Notes (Signed)
Pt arrived via [facility]. Pt comes from home. Pt attempted suicide by overdose. Pt stated he ingested 20x 20mg  tabs of Lopressor. Pt told EMS that pt is under a lot of stress and that pt is taking care of mother who has dementia. Pt is disabled as well. Pt is ambulatory and AOx4.

## 2017-05-24 NOTE — Discharge Instructions (Addendum)
Take 2 potassium pills every day until you see your doctor this Thursday.  Consider eating a banana once a day to help improve your potassium level.  Make sure that you are eating and drinking normally and taking your usual medicines as directed.

## 2017-06-11 ENCOUNTER — Encounter (HOSPITAL_COMMUNITY): Payer: Self-pay | Admitting: Emergency Medicine

## 2017-06-11 ENCOUNTER — Emergency Department (HOSPITAL_COMMUNITY): Payer: Self-pay

## 2017-06-11 ENCOUNTER — Emergency Department (HOSPITAL_COMMUNITY)
Admission: EM | Admit: 2017-06-11 | Discharge: 2017-06-11 | Disposition: A | Discharge status: Home / Self Care | Payer: Self-pay | Attending: Emergency Medicine | Admitting: Emergency Medicine

## 2017-06-11 DIAGNOSIS — E860 Dehydration: Secondary | ICD-10-CM | POA: Insufficient documentation

## 2017-06-11 DIAGNOSIS — I9589 Other hypotension: Secondary | ICD-10-CM | POA: Insufficient documentation

## 2017-06-11 DIAGNOSIS — E861 Hypovolemia: Secondary | ICD-10-CM | POA: Insufficient documentation

## 2017-06-11 DIAGNOSIS — Z79899 Other long term (current) drug therapy: Secondary | ICD-10-CM | POA: Insufficient documentation

## 2017-06-11 DIAGNOSIS — E876 Hypokalemia: Secondary | ICD-10-CM | POA: Insufficient documentation

## 2017-06-11 DIAGNOSIS — R0602 Shortness of breath: Secondary | ICD-10-CM | POA: Insufficient documentation

## 2017-06-11 DIAGNOSIS — Z87891 Personal history of nicotine dependence: Secondary | ICD-10-CM | POA: Insufficient documentation

## 2017-06-11 DIAGNOSIS — I10 Essential (primary) hypertension: Secondary | ICD-10-CM | POA: Insufficient documentation

## 2017-06-11 LAB — COMPREHENSIVE METABOLIC PANEL
ALBUMIN: 3.1 g/dL — AB (ref 3.5–5.0)
ALK PHOS: 77 U/L (ref 38–126)
ALT: 14 U/L — AB (ref 17–63)
AST: 24 U/L (ref 15–41)
Anion gap: 10 (ref 5–15)
BILIRUBIN TOTAL: 1 mg/dL (ref 0.3–1.2)
BUN: 7 mg/dL (ref 6–20)
CALCIUM: 8.1 mg/dL — AB (ref 8.9–10.3)
CO2: 25 mmol/L (ref 22–32)
CREATININE: 1.3 mg/dL — AB (ref 0.61–1.24)
Chloride: 100 mmol/L — ABNORMAL LOW (ref 101–111)
GFR calc Af Amer: >60 mL/min (ref >60)
GFR calc non Af Amer: >60 mL/min (ref >60)
GLUCOSE: 196 mg/dL — AB (ref 65–99)
Potassium: 2.8 mmol/L — ABNORMAL LOW (ref 3.5–5.1)
SODIUM: 135 mmol/L (ref 135–145)
TOTAL PROTEIN: 6.1 g/dL — AB (ref 6.5–8.1)

## 2017-06-11 LAB — URINALYSIS, ROUTINE W REFLEX MICROSCOPIC
Bilirubin Urine: NEGATIVE
Glucose, UA: NEGATIVE mg/dL
Ketones, ur: NEGATIVE mg/dL
Leukocytes, UA: NEGATIVE
Nitrite: NEGATIVE
Protein, ur: NEGATIVE mg/dL
Specific Gravity, Urine: 1.002 — ABNORMAL LOW (ref 1.005–1.030)
Squamous Epithelial / HPF: NONE SEEN
pH: 7 (ref 5.0–8.0)

## 2017-06-11 LAB — CBC WITH DIFFERENTIAL/PLATELET
BASOS PCT: 0 %
Basophils Absolute: 0 10*3/uL (ref 0.0–0.1)
EOS ABS: 0.2 10*3/uL (ref 0.0–0.7)
Eosinophils Relative: 2 %
HEMATOCRIT: 40.3 % (ref 39.0–52.0)
Hemoglobin: 13.1 g/dL (ref 13.0–17.0)
LYMPHS ABS: 5.9 10*3/uL — AB (ref 0.7–4.0)
Lymphocytes Relative: 58 %
MCH: 28.4 pg (ref 26.0–34.0)
MCHC: 32.5 g/dL (ref 30.0–36.0)
MCV: 87.4 fL (ref 78.0–100.0)
MONOS PCT: 7 %
Monocytes Absolute: 0.8 10*3/uL (ref 0.1–1.0)
NEUTROS ABS: 3.4 10*3/uL (ref 1.7–7.7)
Neutrophils Relative %: 33 %
Platelets: 120 10*3/uL — ABNORMAL LOW (ref 150–400)
RBC: 4.61 MIL/uL (ref 4.22–5.81)
RDW: 13.4 % (ref 11.5–15.5)
WBC: 10.3 10*3/uL (ref 4.0–10.5)

## 2017-06-11 LAB — I-STAT CG4 LACTIC ACID, ED
LACTIC ACID, VENOUS: 2.21 mmol/L — AB (ref 0.5–1.9)
Lactic Acid, Venous: 2.49 mmol/L (ref 0.5–1.9)

## 2017-06-11 LAB — RAPID URINE DRUG SCREEN, HOSP PERFORMED
Amphetamines: POSITIVE — AB
BENZODIAZEPINES: POSITIVE — AB
Barbiturates: NOT DETECTED
Cocaine: NOT DETECTED
Opiates: NOT DETECTED
TETRAHYDROCANNABINOL: NOT DETECTED

## 2017-06-11 LAB — ETHANOL

## 2017-06-11 LAB — I-STAT TROPONIN, ED: TROPONIN I, POC: 0 ng/mL (ref 0.00–0.08)

## 2017-06-11 LAB — SALICYLATE LEVEL: Salicylate Lvl: <7 mg/dL (ref 2.8–30.0)

## 2017-06-11 LAB — D-DIMER, QUANTITATIVE: D-Dimer, Quant: 0.36 ug/mL-FEU (ref 0.00–0.50)

## 2017-06-11 LAB — ACETAMINOPHEN LEVEL

## 2017-06-11 LAB — BRAIN NATRIURETIC PEPTIDE: B Natriuretic Peptide: 29.1 pg/mL (ref 0.0–100.0)

## 2017-06-11 MED ORDER — POTASSIUM CHLORIDE CRYS ER 20 MEQ PO TBCR: 20.0000 meq | EXTENDED_RELEASE_TABLET | Freq: Every day | ORAL | 0 refills | Status: DC

## 2017-06-11 MED ORDER — POTASSIUM CHLORIDE CRYS ER 20 MEQ PO TBCR
40.0000 meq | EXTENDED_RELEASE_TABLET | Freq: Once | ORAL | Status: AC
  Administered 2017-06-11: 40 meq via ORAL
  Filled 2017-06-11: qty 2

## 2017-06-11 MED ORDER — SODIUM CHLORIDE 0.9 % IV BOLUS
1000.0000 mL | Freq: Once | INTRAVENOUS | Status: AC
  Administered 2017-06-11: 1000 mL via INTRAVENOUS

## 2017-06-11 MED ORDER — DIPHENOXYLATE-ATROPINE 2.5-0.025 MG PO TABS: 2.0000 | ORAL_TABLET | Freq: Four times a day (QID) | ORAL | 0 refills | Status: DC | PRN

## 2017-06-11 NOTE — ED Notes (Signed)
Travis [name] explained plan of care to pt. and reiterated that pt. needs to stay for his benefit and safety . Patient expressed desire to leave AMA but changed his mind. NS IV bolus infusing , Pt. given Sprite to drink .

## 2017-06-11 NOTE — ED Triage Notes (Signed)
Patient arrived with EMS from home , reports generalized weakness/fatigue and mild dizziness this evening , EMS reported hypotensive 59- systolic received NS 700 ml bolus and Epinephine drip at 4 mcg/min , BP at arrival 101/56 . Alert and oriented . Epinephrine drip discontinued  per EDP .

## 2017-06-11 NOTE — ED Provider Notes (Signed)
[facility] EMERGENCY DEPARTMENT Provider Note   CSN: [id number] Arrival date & time: 06/11/17  0041     History   Chief Complaint Chief Complaint  Patient presents with  . Hypotension    Gen . Weakness/Dizziness    HPI Travis [name] is a 52 y.o. male.  Patient presents to the emergency department for evaluation of generalized weakness.  Patient comes to the ER by ambulance.  He reports that about 2-1/2 hours ago he started feeling weak and slightly dizzy.  Ambulance was called to his home and they found him to be profoundly hypotensive.  Blood pressure was 59 systolic.  He was given a fluid bolus and then started on an epi drip.  EMS report that he has had good response to the above interventions, blood pressure is improved.  Patient reports that he has had diarrhea for 1 or 2 days.  He denies melanotic stools and rectal bleeding.  He has not had any nausea or vomiting.  He complains of low back pain which is chronic and unchanged.  He has not had any abdominal pain, chest pain.  He has not noticed any fever, denies cough, chest congestion, shortness of breath.     Past Medical History:  Diagnosis Date  . Anxiety    Panic attacks  . Chest pain    Hospital, March, 2014, negative enzymes, patient refused in-hospital  stress test, patient canceled outpatient stress test  . Constipation   . Degenerative disk disease   . Degenerative disk disease   . Depression   . GERD (gastroesophageal reflux disease)   . Hypertension   . Incontinence of urine   . Neuromuscular disorder (HCC)   . Obesity   . Polysubstance abuse (HCC)   . PTSD (post-traumatic stress disorder)   . Shortness of breath dyspnea    with exertion  . Spinal stenosis   . Spinal stenosis   . Tachycardia - pulse   . Tobacco abuse     Patient Active Problem List   Diagnosis Date Noted  . ARF (acute renal failure) (HCC) 09/01/2015  . Hypotension 09/01/2015  . Acute encephalopathy 09/01/2015    . Hematochezia 04/07/2014  . 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 Major depressive disorder, single episode, moderate (HCC) 10/12/2013  . Morbid obesity (HCC) 10/12/2013  . Essential hypertension 10/12/2013  . Chronic pain syndrome 10/12/2013  . MDD (major depressive disorder), recurrent episode, severe (HCC) 09/29/2013  . Rectal pain 08/10/2013  . Tachycardia 07/27/2013  . Urine incontinence 07/27/2013  . Rectal bleeding 07/27/2013  . Left arm pain 03/29/2013  . Lumbar radicular pain 03/23/2013  . Bilateral shoulder pain 10/27/2012  . Smoking 10/27/2012  . Dental abscess 07/14/2012  . Hypertension   . Anxiety   . Spinal stenosis   . Tobacco abuse   . Obesity   . Dyslipidemia   . Polysubstance abuse (HCC)   . Chest pain   . Grief 06/11/2012  . Panic attack 01/21/2012  . HYPERCHOLESTEROLEMIA 02/18/2006  . DEPRESSION 02/18/2006  . GERD 02/18/2006  . HEADACHE 02/18/2006    Past Surgical History:  Procedure Laterality Date  . COLONOSCOPY N/A 08/24/2014   Procedure: COLONOSCOPY;  Surgeon: Charlott Rakes, MD;  Location: [facility] ENDOSCOPY;  Service: Endoscopy;  Laterality: N/A;  . ESOPHAGOGASTRODUODENOSCOPY (EGD) WITH PROPOFOL N/A 08/24/2014   Procedure: ESOPHAGOGASTRODUODENOSCOPY (EGD) WITH PROPOFOL;  Surgeon: Charlott Rakes, MD;  Location: [facility] ENDOSCOPY;  Service: Endoscopy;  Laterality: N/A;  . WISDOM TOOTH EXTRACTION  Home Medications    Prior to Admission medications   Medication Sig Start Date End Date Taking? Authorizing Provider  cloNIDine (CATAPRES) 0.3 MG tablet Take 1 tablet (0.3 mg total) 2 (two) times daily by mouth. 01/15/17  Yes Lawyer, Cristal Deerhristopher, PA-C  metoprolol tartrate (LOPRESSOR) 50 MG tablet Take 25 mg by mouth 2 (two) times daily.    Yes [provider]  Omega-3 Fatty Acids (FISH OIL PO) Take 1 tablet by mouth daily.   Yes [provider]  ranitidine (ZANTAC) 150 MG tablet Take 150 mg by mouth at bedtime.    Yes [provider]  amLODipine (NORVASC) 5 MG tablet Take 1 tablet (5 mg total) daily by mouth. Patient not taking: Reported on 05/24/2017 01/15/17   Charlestine NightLawyer, Abygale Karpf, PA-C  lisinopril (PRINIVIL,ZESTRIL) 20 MG tablet Take 1 tablet (20 mg total) by mouth daily. Patient not taking: Reported on 06/11/2017 10/14/16 06/11/25  Linwood DibblesKnapp, Jon, MD    Family History Family History  Problem Relation Age of Onset  . Stroke Mother   . Hypertension Mother   . Hyperlipidemia Mother   . Stroke Father   . Hypertension Father   . Dementia Father   . Diabetes Father   . Heart disease Father   . Hyperlipidemia Father   . Cancer Sister        brain  . Diabetes Sister   . Heart disease Sister   . Hyperlipidemia Sister   . Hypertension Sister   . Stroke Sister   . Heart disease Brother   . Hyperlipidemia Brother     Social History Social History   Tobacco Use  . Smoking status: Former Smoker    Packs/day: 0.02    Years: 27.00    Pack years: 0.54    Types: Cigarettes    Last attempt to quit: 04/05/2015    Years since quitting: 2.1  . Smokeless tobacco: Never Used  . Tobacco comment: "quit April 2016, I havwe one every now and then"  Substance Use Topics  . Alcohol use: No  . Drug use: No     Allergies   Blueberry flavor; Penicillins; Robaxin [methocarbamol]; and Toradol [ketorolac tromethamine]   Review of Systems Review of Systems  Constitutional: Positive for fatigue.  Respiratory: Negative for shortness of breath.   Cardiovascular: Negative for chest pain.  Gastrointestinal: Positive for diarrhea.  Neurological: Positive for dizziness.     Physical Exam Updated Vital Signs BP 117/80   Pulse (!) 58   Temp 98 F (36.7 C) (Oral)   Resp 18   Ht 5\' 11"  (1.803 m)   Wt 113.4 kg (250 lb)   SpO2 96%   BMI 34.87 kg/m   Physical Exam  Constitutional: He is oriented to person, place, and time. He appears well-developed and well-nourished. No distress.  HENT:  Head: Normocephalic  and atraumatic.  Right Ear: Hearing normal.  Left Ear: Hearing normal.  Nose: Nose normal.  Mouth/Throat: Oropharynx is clear and moist and mucous membranes are normal.  Eyes: Pupils are equal, round, and reactive to light. Conjunctivae and EOM are normal.  Neck: Normal range of motion. Neck supple.  Cardiovascular: Regular rhythm, S1 normal and S2 normal. Exam reveals no gallop and no friction rub.  No murmur heard. Pulmonary/Chest: Effort normal and breath sounds normal. No respiratory distress. He exhibits no tenderness.  Abdominal: Soft. Normal appearance and bowel sounds are normal. There is no hepatosplenomegaly. There is no tenderness. There is no rebound, no guarding, no tenderness at  McBurney's point and negative Murphy's sign. No hernia.  Musculoskeletal: Normal range of motion.  Neurological: He is alert and oriented to person, place, and time. He has normal strength. No cranial nerve deficit or sensory deficit. Coordination normal. GCS eye subscore is 4. GCS verbal subscore is 5. GCS motor subscore is 6.  Skin: Skin is warm, dry and intact. No rash noted. No cyanosis.  Psychiatric: He has a normal mood and affect. His speech is normal and behavior is normal. Thought content normal.  Nursing note and vitals reviewed.    ED Treatments / Results  Labs (all labs ordered are listed, but only abnormal results are displayed) Labs Reviewed  CBC WITH DIFFERENTIAL/PLATELET - Abnormal; Notable for the following components:      Result Value   Platelets 120 (*)    Lymphs Abs 5.9 (*)    All other components within normal limits  COMPREHENSIVE METABOLIC PANEL - Abnormal; Notable for the following components:   Potassium 2.8 (*)    Chloride 100 (*)    Glucose, Bld 196 (*)    Creatinine, Ser 1.30 (*)    Calcium 8.1 (*)    Total Protein 6.1 (*)    Albumin 3.1 (*)    ALT 14 (*)    All other components within normal limits  URINALYSIS, ROUTINE W REFLEX MICROSCOPIC - Abnormal; Notable  for the following components:   Color, Urine STRAW (*)    Specific Gravity, Urine 1.002 (*)    Hgb urine dipstick SMALL (*)    Bacteria, UA RARE (*)    All other components within normal limits  RAPID URINE DRUG SCREEN, HOSP PERFORMED - Abnormal; Notable for the following components:   Benzodiazepines POSITIVE (*)    Amphetamines POSITIVE (*)    All other components within normal limits  ACETAMINOPHEN LEVEL - Abnormal; Notable for the following components:   Acetaminophen (Tylenol), Serum <10 (*)    All other components within normal limits  I-STAT CG4 LACTIC ACID, ED - Abnormal; Notable for the following components:   Lactic Acid, Venous 2.49 (*)    All other components within normal limits  I-STAT CG4 LACTIC ACID, ED - Abnormal; Notable for the following components:   Lactic Acid, Venous 2.21 (*)    All other components within normal limits  BRAIN NATRIURETIC PEPTIDE  D-DIMER, QUANTITATIVE (NOT AT [facility])  ETHANOL  SALICYLATE LEVEL  I-STAT TROPONIN, ED    EKG EKG Interpretation  Date/Time:  Wednesday June 11 2017 00:41:39 EDT Ventricular Rate:  73 PR Interval:    QRS Duration: 102 QT Interval:  431 QTC Calculation: 479 R Axis:   1 Text Interpretation:  Sinus rhythm Left ventricular hypertrophy Nonspecific T abnormalities, inferior leads Borderline prolonged QT interval No significant change since last tracing Confirmed by [name] [phone]) on 06/11/2017 12:44:40 AM   Radiology Dg Chest Port 1 View  Result Date: 06/11/2017 CLINICAL DATA:  Weakness and hypotension EXAM: PORTABLE CHEST 1 VIEW COMPARISON:  01/15/2017 FINDINGS: The heart size and mediastinal contours are within normal limits. Both lungs are clear. The visualized skeletal structures are unremarkable. IMPRESSION: No active disease. Electronically Signed   By: Tollie Eth M.D.   On: 06/11/2017 01:39    Procedures Procedures (including critical care time)  Medications Ordered in ED Medications    sodium chloride 0.9 % bolus 1,000 mL (0 mLs Intravenous Stopped 06/11/17 0216)    Followed by  sodium chloride 0.9 % bolus 1,000 mL (0 mLs Intravenous Stopped 06/11/17 0216)  potassium chloride SA (K-DUR,KLOR-CON) CR tablet 40 mEq (40 mEq Oral Given 06/11/17 0521)     Initial Impression / Assessment and Plan / ED Course  I have reviewed the triage vital signs and the nursing notes.  Pertinent labs & imaging results that were available during my care of the patient were reviewed by me and considered in my medical decision making (see chart for details).     Patient presents to the ER by ambulance.  Patient reports that he started to feel ill at home approximately 2-1/2 hours before coming to the ER by ambulance.  EMS found him in some distress with hypotension.  He reports that he has had diarrhea for the last 1 or 2 days that has been somewhat severe.  I suspect that he was dehydrated from the diarrhea.  EMS had initiated him on an epinephrine drip.  This was stopped at arrival to the ER and he was given IV fluids.  Blood pressure has normalized his heart rate is normal.  He has had no further hypotension.  Patient has a benign abdominal exam, no pain or tenderness on examination.  No signs of bacterial infection infection on workup.  Patient found to be hypokalemic, consistent with stated history of diarrhea.  He was initiated on oral replacement.   Patient is no longer orthostatic.  He is taking an oral fluids without difficulty.  No further significant episodes of diarrhea.  I offered patient admission but he declines.  He reports that he needs to take care of his mother does not feel sick enough to go into the hospital.  At this point I do not have any reason to insist on admission and therefore will be discharged with symptomatic treatment.  Final Clinical Impressions(s) / ED Diagnoses   Final diagnoses:  Dehydration  Hypotension due to hypovolemia  Hypokalemia    ED Discharge Orders     None       [name], MD 06/11/17 [phone]

## 2017-07-26 ENCOUNTER — Encounter (HOSPITAL_COMMUNITY): Payer: Self-pay | Admitting: Emergency Medicine

## 2017-07-26 ENCOUNTER — Emergency Department (HOSPITAL_COMMUNITY)
Admission: EM | Admit: 2017-07-26 | Discharge: 2017-07-26 | Disposition: A | Discharge status: Home / Self Care | Payer: Self-pay | Attending: Emergency Medicine | Admitting: Emergency Medicine

## 2017-07-26 ENCOUNTER — Other Ambulatory Visit: Payer: Self-pay

## 2017-07-26 DIAGNOSIS — F17210 Nicotine dependence, cigarettes, uncomplicated: Secondary | ICD-10-CM | POA: Insufficient documentation

## 2017-07-26 DIAGNOSIS — Z760 Encounter for issue of repeat prescription: Secondary | ICD-10-CM | POA: Insufficient documentation

## 2017-07-26 DIAGNOSIS — Z79899 Other long term (current) drug therapy: Secondary | ICD-10-CM | POA: Insufficient documentation

## 2017-07-26 DIAGNOSIS — I10 Essential (primary) hypertension: Secondary | ICD-10-CM | POA: Insufficient documentation

## 2017-07-26 HISTORY — DX: Unspecified convulsions: R56.9

## 2017-07-26 MED ORDER — LORAZEPAM 1 MG PO TABS
1.0000 mg | ORAL_TABLET | Freq: Once | ORAL | Status: AC
  Administered 2017-07-26: 1 mg via ORAL
  Filled 2017-07-26: qty 1

## 2017-07-26 MED ORDER — METOPROLOL TARTRATE 50 MG PO TABS: 25.0000 mg | ORAL_TABLET | Freq: Two times a day (BID) | ORAL | 0 refills | Status: DC

## 2017-07-26 MED ORDER — CLONIDINE HCL 0.3 MG PO TABS: 0.3000 mg | ORAL_TABLET | Freq: Two times a day (BID) | ORAL | 0 refills | Status: DC

## 2017-07-26 NOTE — ED Triage Notes (Signed)
Pt states he is out of his blood pressure medication  Pt states he has been having black out spells where he is functional but cannot remember what he has done and how he has gotten where he is  Pt states his stomach hurts and he has a hx of seizures and feels like he might be getting ready to have one

## 2017-07-26 NOTE — ED Provider Notes (Signed)
[facility]-EMERGENCY DEPT Provider Note: Travis Dell, MD, FACEP  CSN: [id number] MRN: [id number] ARRIVAL: 07/26/17 at 0037 ROOM: WA03/WA03   CHIEF COMPLAINT  Hypertension   HISTORY OF PRESENT ILLNESS  07/26/17 4:24 AM Travis [name] is a 52 y.o. male with history of hypertension and blackouts.  He attributes the blackouts to seizures associated with posttraumatic stress disorder.  He states he had a blackout last week and lost his backpack.  He has been out of his prescription medications since then.  Specifically he is out of his clonidine and metoprolol and is feeling his heart beating rapidly.  He has also noticed his blood pressure has been up.  He also feels like he is going to have a seizure; specifically he is having some mild headache and abdominal pain which is his typical prodrome.  He usually takes Ativan 1 mg daily.  He is feeling anxious since not having his Ativan since last week.  He denies chest pain or shortness of breath.  He has chronic back pain.  He is establishing with a new primary care physician next week.   Past Medical History:  Diagnosis Date  . Anxiety    Panic attacks  . Chest pain    Hospital, March, 2014, negative enzymes, patient refused in-hospital  stress test, patient canceled outpatient stress test  . Constipation   . Degenerative disk disease   . Degenerative disk disease   . Depression   . GERD (gastroesophageal reflux disease)   . Hypertension   . Incontinence of urine   . Neuromuscular disorder (HCC)   . Obesity   . Polysubstance abuse (HCC)   . PTSD (post-traumatic stress disorder)   . Seizures (HCC)   . Shortness of breath dyspnea    with exertion  . Spinal stenosis   . Spinal stenosis   . Tachycardia - pulse   . Tobacco abuse     Past Surgical History:  Procedure Laterality Date  . COLONOSCOPY N/A 08/24/2014   Procedure: COLONOSCOPY;  Surgeon: Charlott Rakes, MD;  Location: [facility] ENDOSCOPY;  Service: Endoscopy;  Laterality: N/A;  .  ESOPHAGOGASTRODUODENOSCOPY (EGD) WITH PROPOFOL N/A 08/24/2014   Procedure: ESOPHAGOGASTRODUODENOSCOPY (EGD) WITH PROPOFOL;  Surgeon: Charlott Rakes, MD;  Location: [facility] ENDOSCOPY;  Service: Endoscopy;  Laterality: N/A;  . WISDOM TOOTH EXTRACTION      Family History  Problem Relation Age of Onset  . Stroke Mother   . Hypertension Mother   . Hyperlipidemia Mother   . Stroke Father   . Hypertension Father   . Dementia Father   . Diabetes Father   . Heart disease Father   . Hyperlipidemia Father   . Cancer Sister        brain  . Diabetes Sister   . Heart disease Sister   . Hyperlipidemia Sister   . Hypertension Sister   . Stroke Sister   . Heart disease Brother   . Hyperlipidemia Brother     Social History   Tobacco Use  . Smoking status: Current Every Day Smoker    Packs/day: 0.02    Years: 27.00    Pack years: 0.54    Types: Cigarettes  . Smokeless tobacco: Never Used  . Tobacco comment: "quit April 2016, I havwe one every now and then"  Substance Use Topics  . Alcohol use: No  . Drug use: No    Prior to Admission medications   Medication Sig Start Date End Date Taking? Authorizing Provider  cloNIDine (CATAPRES) 0.3 MG tablet Take  1 tablet (0.3 mg total) 2 (two) times daily by mouth. 01/15/17   Lawyer, Cristal Deer, PA-C  diphenoxylate-atropine (LOMOTIL) 2.5-0.025 MG tablet Take 2 tablets by mouth 4 (four) times daily as needed for diarrhea or loose stools. 06/11/17   [name], MD  metoprolol tartrate (LOPRESSOR) 50 MG tablet Take 25 mg by mouth 2 (two) times daily.     [provider]  Omega-3 Fatty Acids (FISH OIL PO) Take 1 tablet by mouth daily.    [provider]  potassium chloride SA (K-DUR,KLOR-CON) 20 MEQ tablet Take 1 tablet (20 mEq total) by mouth daily. 06/11/17   [name], MD  ranitidine (ZANTAC) 150 MG tablet Take 150 mg by mouth at bedtime.     [provider]    Allergies Blueberry flavor;  Penicillins; Robaxin [methocarbamol]; and Toradol [ketorolac tromethamine]   REVIEW OF SYSTEMS  Negative except as noted here or in the History of Present Illness.   PHYSICAL EXAMINATION  Initial Vital Signs Blood pressure (!) 161/119, pulse 98, temperature 98.2 F (36.8 C), temperature source Oral, resp. rate 13, SpO2 95 %.  Examination General: Well-developed, well-nourished male in no acute distress; appearance consistent with age of record HENT: normocephalic; atraumatic Eyes: pupils equal, round and reactive to light; extraocular muscles intact Neck: supple Heart: regular rate and rhythm; tachycardia Lungs: clear to auscultation bilaterally Abdomen: soft; nondistended; nontender; bowel sounds present Extremities: No deformity; full range of motion; pulses normal Neurologic: Awake, alert and oriented; motor function intact in all extremities and symmetric; no facial droop Skin: Warm and dry Psychiatric: Normal mood and affect   RESULTS  Summary of this visit's results, reviewed by myself:   EKG Interpretation  Date/Time:  Saturday Jul 26 2017 00:44:05 EDT Ventricular Rate:  105 PR Interval:    QRS Duration: 96 QT Interval:  353 QTC Calculation: 467 R Axis:   -25 Text Interpretation:  Sinus tachycardia Left ventricular hypertrophy Inferior infarct, old Rate is faster Confirmed by Thurmond Hildebran ([id number]) on 07/26/2017 12:55:14 AM      Laboratory Studies: No results found for this or any previous visit (from the past 24 hour(s)). Imaging Studies: No results found.  ED COURSE and MDM  Nursing notes and initial vitals signs, including pulse oximetry, reviewed.  Vitals:   07/26/17 0230 07/26/17 0300 07/26/17 0315 07/26/17 0330  BP: (!) 157/121 (!) 149/116 (!) 150/122 (!) 161/119  Pulse: (!) 106 100 (!) 106 98  Resp: Temp:      TempSrc:      SpO2: 95% 93% 96% 95%   We will refill patient's antihypertensives for the next 30 days.   Consultation with  the [facility] state controlled substances database reveals the patient has received monthly tramadol prescriptions for the past 2 years.  He is received no Ativan prescriptions.   PROCEDURES    ED DIAGNOSES     ICD-10-CM   1. Hypertension not at goal I10   2. Medication refill Z76.0        Shaia Porath, Jonny Ruiz, MD 07/26/17 [phone]

## 2017-07-26 NOTE — ED Notes (Signed)
Bed: WA03 Expected date:  Expected time:  Means of arrival:  Comments: 

## 2017-09-11 ENCOUNTER — Ambulatory Visit (HOSPITAL_COMMUNITY)
Admission: EM | Admit: 2017-09-11 | Discharge: 2017-09-11 | Disposition: A | Discharge status: Home / Self Care | Payer: Self-pay | Attending: Family Medicine | Admitting: Family Medicine

## 2017-09-11 ENCOUNTER — Encounter (HOSPITAL_COMMUNITY): Payer: Self-pay | Admitting: Emergency Medicine

## 2017-09-11 DIAGNOSIS — I10 Essential (primary) hypertension: Secondary | ICD-10-CM

## 2017-09-11 DIAGNOSIS — Z760 Encounter for issue of repeat prescription: Secondary | ICD-10-CM

## 2017-09-11 MED ORDER — METOPROLOL TARTRATE 50 MG PO TABS: 25.0000 mg | ORAL_TABLET | Freq: Two times a day (BID) | ORAL | 0 refills | Status: DC

## 2017-09-11 MED ORDER — CLONIDINE HCL 0.3 MG PO TABS: 0.3000 mg | ORAL_TABLET | Freq: Two times a day (BID) | ORAL | 0 refills | Status: DC

## 2017-09-11 NOTE — ED Triage Notes (Signed)
Pt needs refill of his blood pressure medicine.

## 2017-09-11 NOTE — Discharge Instructions (Signed)
I have sent two days for clonidine to the pharmacy for you as well as a 30 day supply of your metoprolol.  Please establish with a primary care for continued management of your medications and hypertension.

## 2017-09-11 NOTE — ED Provider Notes (Signed)
[facility]    CSN: [id number] Arrival date & time: 09/11/17  1553     History   Chief Complaint Chief Complaint  Patient presents with  . Hypertension  . Medication Refill    HPI Travis [name] is a 52 y.o. male.   Travis [name] presents with requests for blood pressure medication refill after his medications were lost two days ago. States he "blacked out" due to his PTSD for 1.5 days, has happened in the past, and lost his backpack and medications. No chest pain , no palpitations, no shortness of breath  Or headache. Per chart review it appears this occurs somewhat regularly for patient. Does not have a PCP. States he does follow with a psychiatrist. Last med refill on epic review was 5/25. Takes metoprolol as well as clonidine for his blood pressure. Hx of anxiety, panic attacks, depression, ddd, gerd, polysubstance abuse, seizures.     ROS per HPI.      Past Medical History:  Diagnosis Date  . Anxiety    Panic attacks  . Chest pain    Hospital, March, 2014, negative enzymes, patient refused in-hospital  stress test, patient canceled outpatient stress test  . Constipation   . Degenerative disk disease   . Degenerative disk disease   . Depression   . GERD (gastroesophageal reflux disease)   . Hypertension   . Incontinence of urine   . Neuromuscular disorder (HCC)   . Obesity   . Polysubstance abuse (HCC)   . PTSD (post-traumatic stress disorder)   . Seizures (HCC)   . Shortness of breath dyspnea    with exertion  . Spinal stenosis   . Spinal stenosis   . Tachycardia - pulse   . Tobacco abuse     Patient Active Problem List   Diagnosis Date Noted  . ARF (acute renal failure) (HCC) 09/01/2015  . Hypotension 09/01/2015  . Acute encephalopathy 09/01/2015  . Hematochezia 04/07/2014  . 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 Major depressive disorder, single episode, moderate (HCC) 10/12/2013  . Morbid obesity (HCC) 10/12/2013  . Essential hypertension  10/12/2013  . Chronic pain syndrome 10/12/2013  . MDD (major depressive disorder), recurrent episode, severe (HCC) 09/29/2013  . Rectal pain 08/10/2013  . Tachycardia 07/27/2013  . Urine incontinence 07/27/2013  . Rectal bleeding 07/27/2013  . Left arm pain 03/29/2013  . Lumbar radicular pain 03/23/2013  . Bilateral shoulder pain 10/27/2012  . Smoking 10/27/2012  . Dental abscess 07/14/2012  . Hypertension   . Anxiety   . Spinal stenosis   . Tobacco abuse   . Obesity   . Dyslipidemia   . Polysubstance abuse (HCC)   . Chest pain   . Grief 06/11/2012  . Panic attack 01/21/2012  . HYPERCHOLESTEROLEMIA 02/18/2006  . DEPRESSION 02/18/2006  . GERD 02/18/2006  . HEADACHE 02/18/2006    Past Surgical History:  Procedure Laterality Date  . COLONOSCOPY N/A 08/24/2014   Procedure: COLONOSCOPY;  Surgeon: Charlott Rakes, MD;  Location: [facility] ENDOSCOPY;  Service: Endoscopy;  Laterality: N/A;  . ESOPHAGOGASTRODUODENOSCOPY (EGD) WITH PROPOFOL N/A 08/24/2014   Procedure: ESOPHAGOGASTRODUODENOSCOPY (EGD) WITH PROPOFOL;  Surgeon: Charlott Rakes, MD;  Location: [facility] ENDOSCOPY;  Service: Endoscopy;  Laterality: N/A;  . WISDOM TOOTH EXTRACTION         Home Medications    Prior to Admission medications   Medication Sig Start Date End Date Taking? Authorizing Provider  cloNIDine (CATAPRES) 0.3 MG tablet Take 1 tablet (0.3 mg total) by mouth 2 (two) times daily  for 2 days. 09/11/17 09/13/17  Georgetta HaberBurky, Naziya Hegwood B, NP  diphenoxylate-atropine (LOMOTIL) 2.5-0.025 MG tablet Take 2 tablets by mouth 4 (four) times daily as needed for diarrhea or loose stools. 06/11/17   Gilda CreasePollina, Christopher J, MD  metoprolol tartrate (LOPRESSOR) 50 MG tablet Take 0.5 tablets (25 mg total) by mouth 2 (two) times daily. 09/11/17 10/11/17  Georgetta HaberBurky, Shubham Thackston B, NP  Omega-3 Fatty Acids (FISH OIL PO) Take 1 tablet by mouth daily.    [provider]  potassium chloride SA (K-DUR,KLOR-CON) 20 MEQ tablet Take 1 tablet (20 mEq total)  by mouth daily. 06/11/17   Gilda CreasePollina, Christopher J, MD  ranitidine (ZANTAC) 150 MG tablet Take 150 mg by mouth at bedtime.     [provider]    Family History Family History  Problem Relation Age of Onset  . Stroke Mother   . Hypertension Mother   . Hyperlipidemia Mother   . Stroke Father   . Hypertension Father   . Dementia Father   . Diabetes Father   . Heart disease Father   . Hyperlipidemia Father   . Cancer Sister        brain  . Diabetes Sister   . Heart disease Sister   . Hyperlipidemia Sister   . Hypertension Sister   . Stroke Sister   . Heart disease Brother   . Hyperlipidemia Brother     Social History Social History   Tobacco Use  . Smoking status: Current Every Day Smoker    Packs/day: 0.02    Years: 27.00    Pack years: 0.54    Types: Cigarettes  . Smokeless tobacco: Never Used  . Tobacco comment: "quit April 2016, I havwe one every now and then"  Substance Use Topics  . Alcohol use: No  . Drug use: No     Allergies   Blueberry flavor; Penicillins; Robaxin [methocarbamol]; and Toradol [ketorolac tromethamine]   Review of Systems Review of Systems   Physical Exam Triage Vital Signs ED Triage Vitals  Enc Vitals Group     BP 09/11/17 1616 (!) 156/105     Pulse Rate 09/11/17 1616 90     Resp 09/11/17 1616 16     Temp 09/11/17 1616 98.3 F (36.8 C)     Temp src --      SpO2 09/11/17 1616 100 %     Weight --      Height --      Head Circumference --      Peak Flow --      Pain Score 09/11/17 1617 0     Pain Loc --      Pain Edu? --      Excl. in GC? --    No data found.  Updated Vital Signs BP (!) 156/105   Pulse 90   Temp 98.3 F (36.8 C)   Resp 16   SpO2 100%    Physical Exam  Constitutional: He is oriented to person, place, and time. He appears well-developed and well-nourished.  Cardiovascular: Normal rate and regular rhythm.  Pulmonary/Chest: Effort normal and breath sounds normal.  Neurological: He is alert  and oriented to person, place, and time.  Skin: Skin is warm and dry.     UC Treatments / Results  Labs (all labs ordered are listed, but only abnormal results are displayed) Labs Reviewed - No data to display  EKG None  Radiology No results found.  Procedures Procedures (including critical care time)  Medications Ordered in  UC Medications - No data to display  Initial Impression / Assessment and Plan / UC Course  I have reviewed the triage vital signs and the nursing notes.  Pertinent labs & imaging results that were available during my care of the patient were reviewed by me and considered in my medical decision making (see chart for details).     Non toxic in appearance. No complaints today. Noted elevation in BP. Last med refill 5/25. Concern with clonidine use in this patient with higher risk for abuse. Discussed that can provide a few tabs but will provide 1 month of metoprolol. Encouraged establish with PCP for management of medications and htn. Patient verbalized understanding and agreeable to plan.    Final Clinical Impressions(s) / UC Diagnoses   Final diagnoses:  Encounter for medication refill  Essential hypertension     Discharge Instructions     I have sent two days for clonidine to the pharmacy for you as well as a 30 day supply of your metoprolol.  Please establish with a primary care for continued management of your medications and hypertension.    ED Prescriptions    Medication Sig Dispense Auth. Provider   metoprolol tartrate (LOPRESSOR) 50 MG tablet  (Status: Discontinued) Take 0.5 tablets (25 mg total) by mouth 2 (two) times daily. 30 tablet Linus Mako B, NP   cloNIDine (CATAPRES) 0.3 MG tablet  (Status: Discontinued) Take 1 tablet (0.3 mg total) by mouth 2 (two) times daily for 2 days. 4 tablet Linus Mako B, NP   cloNIDine (CATAPRES) 0.3 MG tablet Take 1 tablet (0.3 mg total) by mouth 2 (two) times daily for 2 days. 4 tablet Linus Mako B, NP   metoprolol tartrate (LOPRESSOR) 50 MG tablet  (Status: Discontinued) Take 0.5 tablets (25 mg total) by mouth 2 (two) times daily. 30 tablet Linus Mako B, NP   metoprolol tartrate (LOPRESSOR) 50 MG tablet Take 0.5 tablets (25 mg total) by mouth 2 (two) times daily. 30 tablet Georgetta Haber, NP     Controlled Substance Prescriptions [place] Controlled Substance Registry consulted? Not Applicable   Georgetta Haber, NP 09/11/17 1740

## 2017-09-11 NOTE — ED Triage Notes (Signed)
Pt states he has PTSD and blackout spells and had an anxiety attack two days ago, and he "blacked out" and lost his medication. Pt states hes homeless.

## 2017-10-12 ENCOUNTER — Other Ambulatory Visit: Payer: Self-pay

## 2017-10-12 ENCOUNTER — Emergency Department (HOSPITAL_COMMUNITY)
Admission: EM | Admit: 2017-10-12 | Discharge: 2017-10-12 | Disposition: A | Discharge status: Home / Self Care | Payer: Self-pay | Attending: Emergency Medicine | Admitting: Emergency Medicine

## 2017-10-12 ENCOUNTER — Encounter (HOSPITAL_COMMUNITY): Payer: Self-pay | Admitting: *Deleted

## 2017-10-12 DIAGNOSIS — I10 Essential (primary) hypertension: Secondary | ICD-10-CM | POA: Insufficient documentation

## 2017-10-12 DIAGNOSIS — R000 Tachycardia, unspecified: Secondary | ICD-10-CM | POA: Insufficient documentation

## 2017-10-12 DIAGNOSIS — F17210 Nicotine dependence, cigarettes, uncomplicated: Secondary | ICD-10-CM | POA: Insufficient documentation

## 2017-10-12 DIAGNOSIS — Z79899 Other long term (current) drug therapy: Secondary | ICD-10-CM | POA: Insufficient documentation

## 2017-10-12 DIAGNOSIS — Z760 Encounter for issue of repeat prescription: Secondary | ICD-10-CM | POA: Insufficient documentation

## 2017-10-12 MED ORDER — METOPROLOL TARTRATE 50 MG PO TABS: 25.0000 mg | ORAL_TABLET | Freq: Two times a day (BID) | ORAL | 0 refills | Status: DC

## 2017-10-12 MED ORDER — CLONIDINE HCL 0.3 MG PO TABS: 0.3000 mg | ORAL_TABLET | Freq: Two times a day (BID) | ORAL | 0 refills | Status: DC

## 2017-10-12 MED ORDER — CLONIDINE HCL 0.1 MG PO TABS
0.3000 mg | ORAL_TABLET | Freq: Once | ORAL | Status: AC
Start: 2017-10-12 — End: 2017-10-12
  Administered 2017-10-12: 0.3 mg via ORAL
  Filled 2017-10-12: qty 3
  Filled 2017-10-12: qty 1

## 2017-10-12 NOTE — ED Provider Notes (Signed)
[facility]-EMERGENCY DEPT Provider Note   CSN: [id number] Arrival date & time: 10/12/17  0159     History   Chief Complaint Chief Complaint  Patient presents with  . Hypertension    HPI Travis [name] is a 52 y.o. male with a past medical history of PTSD who frequently experience blackouts causing him to lose his backpack containing his medication, presents today for medication refill.  He reports that he had 1 of his "normal" PTSD episodes where he disappeared and blacked out and does not remember where he went on Thursday night.  He says that he lost his clonidine and Lopressor on Thursday night.  He also reports multiple stressful events today and says that since then he has felt his heart rate is fast.  He denies any chest pain or shortness of breath.  He is here requesting a medication refill.  HPI  Past Medical History:  Diagnosis Date  . Anxiety    Panic attacks  . Chest pain    Hospital, March, 2014, negative enzymes, patient refused in-hospital  stress test, patient canceled outpatient stress test  . Constipation   . Degenerative disk disease   . Degenerative disk disease   . Depression   . GERD (gastroesophageal reflux disease)   . Hypertension   . Incontinence of urine   . Neuromuscular disorder (HCC)   . Obesity   . Polysubstance abuse (HCC)   . PTSD (post-traumatic stress disorder)   . Seizures (HCC)   . Shortness of breath dyspnea    with exertion  . Spinal stenosis   . Spinal stenosis   . Tachycardia - pulse   . Tobacco abuse     Patient Active Problem List   Diagnosis Date Noted  . ARF (acute renal failure) (HCC) 09/01/2015  . Hypotension 09/01/2015  . Acute encephalopathy 09/01/2015  . Hematochezia 04/07/2014  . 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 Major depressive disorder, single episode, moderate (HCC) 10/12/2013  . Morbid obesity (HCC) 10/12/2013  . Essential hypertension 10/12/2013  . Chronic pain syndrome 10/12/2013   . MDD (major depressive disorder), recurrent episode, severe (HCC) 09/29/2013  . Rectal pain 08/10/2013  . Tachycardia 07/27/2013  . Urine incontinence 07/27/2013  . Rectal bleeding 07/27/2013  . Left arm pain 03/29/2013  . Lumbar radicular pain 03/23/2013  . Bilateral shoulder pain 10/27/2012  . Smoking 10/27/2012  . Dental abscess 07/14/2012  . Hypertension   . Anxiety   . Spinal stenosis   . Tobacco abuse   . Obesity   . Dyslipidemia   . Polysubstance abuse (HCC)   . Chest pain   . Grief 06/11/2012  . Panic attack 01/21/2012  . HYPERCHOLESTEROLEMIA 02/18/2006  . DEPRESSION 02/18/2006  . GERD 02/18/2006  . HEADACHE 02/18/2006    Past Surgical History:  Procedure Laterality Date  . COLONOSCOPY N/A 08/24/2014   Procedure: COLONOSCOPY;  Surgeon: Charlott Rakes, MD;  Location: [facility] ENDOSCOPY;  Service: Endoscopy;  Laterality: N/A;  . ESOPHAGOGASTRODUODENOSCOPY (EGD) WITH PROPOFOL N/A 08/24/2014   Procedure: ESOPHAGOGASTRODUODENOSCOPY (EGD) WITH PROPOFOL;  Surgeon: Charlott Rakes, MD;  Location: [facility] ENDOSCOPY;  Service: Endoscopy;  Laterality: N/A;  . WISDOM TOOTH EXTRACTION          Home Medications    Prior to Admission medications   Medication Sig Start Date End Date Taking? Authorizing Provider  cloNIDine (CATAPRES) 0.3 MG tablet Take 1 tablet (0.3 mg total) by mouth 2 (two) times daily. 10/12/17 11/11/17  Cristina Gong, PA-C  diphenoxylate-atropine (LOMOTIL)  2.5-0.025 MG tablet Take 2 tablets by mouth 4 (four) times daily as needed for diarrhea or loose stools. 06/11/17   Gilda CreasePollina, Christopher J, MD  metoprolol tartrate (LOPRESSOR) 50 MG tablet Take 0.5 tablets (25 mg total) by mouth 2 (two) times daily. 10/12/17 11/11/17  Cristina GongHammond, Alexanderia Gorby W, PA-C  Omega-3 Fatty Acids (FISH OIL PO) Take 1 tablet by mouth daily.    [provider]  potassium chloride SA (K-DUR,KLOR-CON) 20 MEQ tablet Take 1 tablet (20 mEq total) by mouth daily. 06/11/17   Gilda CreasePollina,  Christopher J, MD  ranitidine (ZANTAC) 150 MG tablet Take 150 mg by mouth at bedtime.     [provider]    Family History Family History  Problem Relation Age of Onset  . Stroke Mother   . Hypertension Mother   . Hyperlipidemia Mother   . Stroke Father   . Hypertension Father   . Dementia Father   . Diabetes Father   . Heart disease Father   . Hyperlipidemia Father   . Cancer Sister        brain  . Diabetes Sister   . Heart disease Sister   . Hyperlipidemia Sister   . Hypertension Sister   . Stroke Sister   . Heart disease Brother   . Hyperlipidemia Brother     Social History Social History   Tobacco Use  . Smoking status: Current Every Day Smoker    Packs/day: 0.02    Years: 27.00    Pack years: 0.54    Types: Cigarettes  . Smokeless tobacco: Never Used  . Tobacco comment: "quit April 2016, I havwe one every now and then"  Substance Use Topics  . Alcohol use: No  . Drug use: No     Allergies   Blueberry flavor; Penicillins; Robaxin [methocarbamol]; and Toradol [ketorolac tromethamine]   Review of Systems Review of Systems  Constitutional: Negative for chills and fever.  Respiratory: Negative for chest tightness and shortness of breath.   Cardiovascular: Negative for chest pain and palpitations.  Gastrointestinal: Negative for abdominal pain, nausea and vomiting.  Neurological: Negative for headaches.  Psychiatric/Behavioral: Positive for confusion. Negative for agitation and suicidal ideas.  All other systems reviewed and are negative.    Physical Exam Updated Vital Signs BP (!) 154/115 (BP Location: Left Arm)   Pulse (!) 105   Temp 98.3 F (36.8 C) (Oral)   Resp 18   Ht 5\' 9"  (1.753 m)   Wt 112.5 kg   SpO2 97%   BMI 36.62 kg/m   Physical Exam  Constitutional: He is oriented to person, place, and time. He appears well-developed and well-nourished. No distress.  HENT:  Head: Normocephalic and atraumatic.  Eyes: Conjunctivae are  normal. Right eye exhibits no discharge. Left eye exhibits no discharge. No scleral icterus.  Neck: Normal range of motion. Neck supple.  Cardiovascular: Regular rhythm. Tachycardia present.  No murmur heard. Pulmonary/Chest: Effort normal and breath sounds normal. No stridor. No respiratory distress.  Abdominal: He exhibits no distension.  Musculoskeletal: He exhibits no edema or deformity.  Neurological: He is alert and oriented to person, place, and time. He exhibits normal muscle tone.  Skin: Skin is warm and dry. He is not diaphoretic.  Psychiatric: He has a normal mood and affect. His behavior is normal.  Nursing note and vitals reviewed.    ED Treatments / Results  Labs (all labs ordered are listed, but only abnormal results are displayed) Labs Reviewed - No data to display  EKG None  Radiology No results found.  Procedures Procedures (including critical care time)  Medications Ordered in ED Medications  cloNIDine (CATAPRES) tablet 0.3 mg (0.3 mg Oral Given 10/12/17 0311)     Initial Impression / Assessment and Plan / ED Course  I have reviewed the triage vital signs and the nursing notes.  Pertinent labs & imaging results that were available during my care of the patient were reviewed by me and considered in my medical decision making (see chart for details).    Patient presents today requesting a refill of his hypertension medications.  Chart review shows that he frequently reports losing his backpack during a PTSD episode where he reportedly blacks out.  He was noted to be tachycardic and hypertensive while in the emergency room.  EKG was obtained without acute abnormalities.  Recommended further work up of the tachycardia to the patient which he declined.  He says he want's a dose of his clonidine and rx refills.  We discussed the risks of not having additional work up and as he is asymptomatic he declines.    Return precautions were discussed with patient who  states their understanding.  At the time of discharge patient denied any unaddressed complaints or concerns.  Patient is agreeable for discharge home.    Final Clinical Impressions(s) / ED Diagnoses   Final diagnoses:  Hypertension, unspecified type  Tachycardia  Encounter for medication refill    ED Discharge Orders         Ordered    cloNIDine (CATAPRES) 0.3 MG tablet  2 times daily     10/12/17 0302    metoprolol tartrate (LOPRESSOR) 50 MG tablet  2 times daily     10/12/17 0302           Cristina Gong, PA-C 10/12/17 0500    Azalia Bilis, MD 10/12/17 [phone]

## 2017-10-12 NOTE — Discharge Instructions (Addendum)
I have sent your refills to [facility] on [address].  If you do not want them at that [facility] please call them and they should be able to [facility].    Today you declined additional testing to make sure that there is not a serious cause behind your high heart rate.  I have given you refills on your medications.  Please make sure that you follow-up with your primary care doctor.  If you develop chest pain, shortness of breath, develop fevers or any other additional concerns please seek additional medical care and evaluation.
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
## 2017-11-12 ENCOUNTER — Ambulatory Visit (HOSPITAL_COMMUNITY)
Admission: EM | Admit: 2017-11-12 | Discharge: 2017-11-12 | Disposition: A | Discharge status: Home / Self Care | Payer: Self-pay | Attending: Family Medicine | Admitting: Family Medicine

## 2017-11-12 ENCOUNTER — Encounter (HOSPITAL_COMMUNITY): Payer: Self-pay

## 2017-11-12 DIAGNOSIS — Z760 Encounter for issue of repeat prescription: Secondary | ICD-10-CM

## 2017-11-12 DIAGNOSIS — G43109 Migraine with aura, not intractable, without status migrainosus: Secondary | ICD-10-CM

## 2017-11-12 DIAGNOSIS — M5442 Lumbago with sciatica, left side: Secondary | ICD-10-CM

## 2017-11-12 DIAGNOSIS — G8929 Other chronic pain: Secondary | ICD-10-CM

## 2017-11-12 DIAGNOSIS — I10 Essential (primary) hypertension: Secondary | ICD-10-CM

## 2017-11-12 MED ORDER — METHYLPREDNISOLONE ACETATE 80 MG/ML IJ SUSP
INTRAMUSCULAR | Status: AC
  Filled 2017-11-12: qty 1

## 2017-11-12 MED ORDER — METOPROLOL TARTRATE 50 MG PO TABS: 25.0000 mg | ORAL_TABLET | Freq: Two times a day (BID) | ORAL | 1 refills | Status: DC

## 2017-11-12 MED ORDER — TRAMADOL HCL 50 MG PO TABS: 50.0000 mg | ORAL_TABLET | Freq: Four times a day (QID) | ORAL | 0 refills | Status: DC | PRN

## 2017-11-12 MED ORDER — METHYLPREDNISOLONE ACETATE 80 MG/ML IJ SUSP
80.0000 mg | Freq: Once | INTRAMUSCULAR | Status: AC
  Administered 2017-11-12: 80 mg via INTRAMUSCULAR

## 2017-11-12 MED ORDER — CLONIDINE HCL 0.3 MG PO TABS: 0.3000 mg | ORAL_TABLET | Freq: Two times a day (BID) | ORAL | 0 refills | Status: DC

## 2017-11-12 MED ORDER — CYCLOBENZAPRINE HCL 10 MG PO TABS: 10.0000 mg | ORAL_TABLET | Freq: Two times a day (BID) | ORAL | 0 refills | Status: DC | PRN

## 2017-11-12 NOTE — Discharge Instructions (Addendum)
We gave you a shot of Depo-Medrol 80 mg today  For mild/moderate pain and during the day : Use anti-inflammatories for pain/swelling. You may take up to 800 mg Ibuprofen every 8 hours with food. You may supplement Ibuprofen with Tylenol [id number] mg every 8 hours.   You may use flexeril as needed to help with pain. This is a muscle relaxer and causes sedation- please use only at bedtime or when you will be home and not have to drive/work- may take 1/2 tablet if causing a lot of sleepiness  For severe pain or bedtime: You may take tramadol, please do not drive after use  Please be careful using both tramadol and Flexeril together as these both cause drowsiness; avoid taking together  I have refilled your blood pressure medicines, please continue to monitor your blood pressure at home, follow-up if headache persisting, not improving, persistent vision changes, lack of vision, increased weakness, worsening back pain, worsening incontinence or constipation, worsening numbness

## 2017-11-12 NOTE — ED Provider Notes (Signed)
[facility]    CSN: [id number] Arrival date & time: 11/12/17  1536     History   Chief Complaint Chief Complaint  Patient presents with  . Medication Refill    Blood Pressure Medication  . Back Pain    No Injury    HPI Travis [name] is a 52 y.o. male history of hypertension, PTSD, spinal stenosis/DDD, presenting today for evaluation of elevated blood pressure/blood pressure medication refill, headache, back pain.  Patient has been off of his blood pressure medicine for approximately 2 days, he noted that his blood pressure this morning and yesterday evening was around 247/153 and his heart rate was in the 150s.  This morning he took 2 verapamil which were not prescribed to him.  He has not had a headache that began last evening, describes this as his typical migraine with has associated aura.  He states that his headache is mainly because he has not had sleep in the past 3 days.  He is also noticed over the past few days he has had worsening low back pain, which have kept him up at night.  He has been taking ibuprofen without relief.  He states that he has known spinal stenosis in the L4-L5 region, today he is also having pain in his thoracic region.  Denies any new injury or increase in activity.  He states that he does have numbness in his saddle region, but this is normal for him from his stockpile stenosis.  He denies any changes in his bowel or bladder function.  He has noted some blurry vision associated with this headache as well, but states that this is also typical for his migraines.  He also try taking Ativan.  He is working on getting reinsured in order to have another MRI of his back and better follow-up with a primary care.  HPI  Past Medical History:  Diagnosis Date  . Anxiety    Panic attacks  . Chest pain    Hospital, March, 2014, negative enzymes, patient refused in-hospital  stress test, patient canceled outpatient stress test  . Constipation   .  Degenerative disk disease   . Degenerative disk disease   . Depression   . GERD (gastroesophageal reflux disease)   . Hypertension   . Incontinence of urine   . Neuromuscular disorder (HCC)   . Obesity   . Polysubstance abuse (HCC)   . PTSD (post-traumatic stress disorder)   . Seizures (HCC)   . Shortness of breath dyspnea    with exertion  . Spinal stenosis   . Spinal stenosis   . Tachycardia - pulse   . Tobacco abuse     Patient Active Problem List   Diagnosis Date Noted  . ARF (acute renal failure) (HCC) 09/01/2015  . Hypotension 09/01/2015  . Acute encephalopathy 09/01/2015  . Hematochezia 04/07/2014  . 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 Major depressive disorder, single episode, moderate (HCC) 10/12/2013  . Morbid obesity (HCC) 10/12/2013  . Essential hypertension 10/12/2013  . Chronic pain syndrome 10/12/2013  . MDD (major depressive disorder), recurrent episode, severe (HCC) 09/29/2013  . Rectal pain 08/10/2013  . Tachycardia 07/27/2013  . Urine incontinence 07/27/2013  . Rectal bleeding 07/27/2013  . Left arm pain 03/29/2013  . Lumbar radicular pain 03/23/2013  . Bilateral shoulder pain 10/27/2012  . Smoking 10/27/2012  . Dental abscess 07/14/2012  . Hypertension   . Anxiety   . Spinal stenosis   . Tobacco abuse   . Obesity   .  Dyslipidemia   . Polysubstance abuse (HCC)   . Chest pain   . Grief 06/11/2012  . Panic attack 01/21/2012  . HYPERCHOLESTEROLEMIA 02/18/2006  . DEPRESSION 02/18/2006  . GERD 02/18/2006  . HEADACHE 02/18/2006    Past Surgical History:  Procedure Laterality Date  . COLONOSCOPY N/A 08/24/2014   Procedure: COLONOSCOPY;  Surgeon: Charlott Rakes, MD;  Location: [facility] ENDOSCOPY;  Service: Endoscopy;  Laterality: N/A;  . ESOPHAGOGASTRODUODENOSCOPY (EGD) WITH PROPOFOL N/A 08/24/2014   Procedure: ESOPHAGOGASTRODUODENOSCOPY (EGD) WITH PROPOFOL;  Surgeon: Charlott Rakes, MD;  Location: [facility] ENDOSCOPY;  Service: Endoscopy;  Laterality:  N/A;  . WISDOM TOOTH EXTRACTION         Home Medications    Prior to Admission medications   Medication Sig Start Date End Date Taking? Authorizing Provider  cloNIDine (CATAPRES) 0.3 MG tablet Take 1 tablet (0.3 mg total) by mouth 2 (two) times daily. 11/12/17 12/12/17  Wieters, Hallie C, PA-C  cyclobenzaprine (FLEXERIL) 10 MG tablet Take 1 tablet (10 mg total) by mouth 2 (two) times daily as needed for muscle spasms. 11/12/17   Wieters, Hallie C, PA-C  diphenoxylate-atropine (LOMOTIL) 2.5-0.025 MG tablet Take 2 tablets by mouth 4 (four) times daily as needed for diarrhea or loose stools. 06/11/17   [name], MD  metoprolol tartrate (LOPRESSOR) 50 MG tablet Take 0.5 tablets (25 mg total) by mouth 2 (two) times daily. 11/12/17 12/12/17  Wieters, Hallie C, PA-C  Omega-3 Fatty Acids (FISH OIL PO) Take 1 tablet by mouth daily.    [provider]  potassium chloride SA (K-DUR,KLOR-CON) 20 MEQ tablet Take 1 tablet (20 mEq total) by mouth daily. 06/11/17   [name], MD  ranitidine (ZANTAC) 150 MG tablet Take 150 mg by mouth at bedtime.     [provider]  traMADol (ULTRAM) 50 MG tablet Take 1 tablet (50 mg total) by mouth every 6 (six) hours as needed for severe pain. 11/12/17   Wieters, Junius Creamer, PA-C    Family History Family History  Problem Relation Age of Onset  . Stroke Mother   . Hypertension Mother   . Hyperlipidemia Mother   . Stroke Father   . Hypertension Father   . Dementia Father   . Diabetes Father   . Heart disease Father   . Hyperlipidemia Father   . Cancer Sister        brain  . Diabetes Sister   . Heart disease Sister   . Hyperlipidemia Sister   . Hypertension Sister   . Stroke Sister   . Heart disease Brother   . Hyperlipidemia Brother     Social History Social History   Tobacco Use  . Smoking status: Current Every Day Smoker    Packs/day: 0.02    Years: 27.00    Pack years: 0.54    Types: Cigarettes  . Smokeless  tobacco: Never Used  . Tobacco comment: "quit April 2016, I havwe one every now and then"  Substance Use Topics  . Alcohol use: No  . Drug use: No     Allergies   Blueberry flavor; Penicillins; Robaxin [methocarbamol]; and Toradol [ketorolac tromethamine]   Review of Systems Review of Systems  Constitutional: Negative for fatigue and fever.  HENT: Negative for congestion, sinus pressure and sore throat.   Eyes: Positive for visual disturbance. Negative for photophobia and pain.  Respiratory: Negative for cough and shortness of breath.   Cardiovascular: Negative for chest pain and leg swelling.  Gastrointestinal: Negative for abdominal pain,  nausea and vomiting.  Genitourinary: Negative for decreased urine volume and hematuria.  Musculoskeletal: Positive for back pain and myalgias. Negative for neck pain and neck stiffness.  Skin: Negative for rash and wound.  Neurological: Positive for numbness and headaches. Negative for dizziness, syncope, facial asymmetry, speech difficulty, weakness and light-headedness.     Physical Exam Triage Vital Signs ED Triage Vitals [11/12/17 1618]  Enc Vitals Group     BP 124/81     Pulse Rate 64     Resp 20     Temp 97.7 F (36.5 C)     Temp src      SpO2 99 %     Weight      Height      Head Circumference      Peak Flow      Pain Score      Pain Loc      Pain Edu?      Excl. in GC?    No data found.  Updated Vital Signs BP 124/81 (BP Location: Left Arm)   Pulse 64   Temp 97.7 F (36.5 C)   Resp 20   SpO2 99%   Visual Acuity Right Eye Distance:   Left Eye Distance:   Bilateral Distance:    Right Eye Near:   Left Eye Near:    Bilateral Near:     Physical Exam  Constitutional: He is oriented to person, place, and time. He appears well-developed and well-nourished.  HENT:  Head: Normocephalic and atraumatic.  Mouth/Throat: Oropharynx is clear and moist.  Eyes: Pupils are equal, round, and reactive to light.  Conjunctivae and EOM are normal.  Red reflex present, no papilledema  Neck: Normal range of motion. Neck supple.  Cardiovascular: Normal rate and regular rhythm.  No murmur heard. Pulmonary/Chest: Effort normal and breath sounds normal. No respiratory distress.  Breathing comfortably at rest, CTABL, no wheezing, rales or other adventitious sounds auscultated  Abdominal: Soft. There is no tenderness.  Musculoskeletal: He exhibits no edema.  Neurological: He is alert and oriented to person, place, and time.  Patient A&O x3, cranial nerves II-XII grossly intact, strength at shoulders 5/5, hips and knees 4/5 bilaterally ; ambulating with cane, slowly  Skin: Skin is warm and dry.  Psychiatric: He has a normal mood and affect.  Nursing note and vitals reviewed.    UC Treatments / Results  Labs (all labs ordered are listed, but only abnormal results are displayed) Labs Reviewed - No data to display  EKG None  Radiology No results found.  Procedures Procedures (including critical care time)  Medications Ordered in UC Medications  methylPREDNISolone acetate (DEPO-MEDROL) injection 80 mg (has no administration in time range)    Initial Impression / Assessment and Plan / UC Course  I have reviewed the triage vital signs and the nursing notes.  Pertinent labs & imaging results that were available during my care of the patient were reviewed by me and considered in my medical decision making (see chart for details).     Blood pressure and vital signs stable in clinic today, will refill blood pressure medicines, will treat headache as migraine with anti-inflammatories, patient allergic to Toradol, acute on chronic back pain, patient does have saddle anesthesia, but this is not new for him,will provide depomedrol in clinic today, no hx of DM; will provide tramadol to use for severe pain, Flexeril at bedtime, anti-inflammatories for mild to moderate pain.  Advised not to mix the tramadol in  the  Flexeril as this increases sedation, do not drive after taking either of these medicines.  Continue to monitor headache and blood pressure, go to emergency room if headache worsening, having changes in vision, blood pressure increasing again, chest pain, shortness of breath, increased weakness.  Discussed strict return precautions. Patient verbalized understanding and is agreeable with plan.  Final Clinical Impressions(s) / UC Diagnoses   Final diagnoses:  Essential hypertension  Medication refill  Chronic left-sided low back pain with left-sided sciatica  Migraine with aura and without status migrainosus, not intractable     Discharge Instructions     We gave you a shot of Depo-Medrol 80 mg today  For mild/moderate pain and during the day : Use anti-inflammatories for pain/swelling. You may take up to 800 mg Ibuprofen every 8 hours with food. You may supplement Ibuprofen with Tylenol [phone] mg every 8 hours.   You may use flexeril as needed to help with pain. This is a muscle relaxer and causes sedation- please use only at bedtime or when you will be home and not have to drive/work- may take 1/2 tablet if causing a lot of sleepiness  For severe pain or bedtime: You may take tramadol, please do not drive after use  Please be careful using both tramadol and Flexeril together as these both cause drowsiness; avoid taking together  I have refilled your blood pressure medicines, please continue to monitor your blood pressure at home, follow-up if headache persisting, not improving, persistent vision changes, lack of vision, increased weakness, worsening back pain, worsening incontinence or constipation, worsening numbness   ED Prescriptions    Medication Sig Dispense Auth. Provider   traMADol (ULTRAM) 50 MG tablet Take 1 tablet (50 mg total) by mouth every 6 (six) hours as needed for severe pain. 20 tablet Wieters, Hallie C, PA-C   cyclobenzaprine (FLEXERIL) 10 MG tablet Take 1  tablet (10 mg total) by mouth 2 (two) times daily as needed for muscle spasms. 20 tablet Wieters, Hallie C, PA-C   metoprolol tartrate (LOPRESSOR) 50 MG tablet Take 0.5 tablets (25 mg total) by mouth 2 (two) times daily. 30 tablet Wieters, Hallie C, PA-C   cloNIDine (CATAPRES) 0.3 MG tablet Take 1 tablet (0.3 mg total) by mouth 2 (two) times daily. 60 tablet Wieters, Concrete C, PA-C     Controlled Substance Prescriptions  Controlled Substance Registry consulted? Not Applicable   Lew Dawes, [place] 11/12/17 1723

## 2017-11-12 NOTE — ED Triage Notes (Signed)
Pt presents with back pain and headache, not related to any injury and not relieved by any medication.  Pt presents to get a refill on blood pressure medication.

## 2017-11-29 ENCOUNTER — Emergency Department (HOSPITAL_COMMUNITY)
Admission: EM | Admit: 2017-11-29 | Discharge: 2017-11-29 | Disposition: A | Discharge status: Home / Self Care | Payer: Self-pay | Attending: Emergency Medicine | Admitting: Emergency Medicine

## 2017-11-29 ENCOUNTER — Other Ambulatory Visit: Payer: Self-pay

## 2017-11-29 ENCOUNTER — Encounter (HOSPITAL_COMMUNITY): Payer: Self-pay | Admitting: *Deleted

## 2017-11-29 DIAGNOSIS — I10 Essential (primary) hypertension: Secondary | ICD-10-CM | POA: Insufficient documentation

## 2017-11-29 DIAGNOSIS — F17210 Nicotine dependence, cigarettes, uncomplicated: Secondary | ICD-10-CM | POA: Insufficient documentation

## 2017-11-29 DIAGNOSIS — Z760 Encounter for issue of repeat prescription: Secondary | ICD-10-CM | POA: Insufficient documentation

## 2017-11-29 DIAGNOSIS — Z79899 Other long term (current) drug therapy: Secondary | ICD-10-CM | POA: Insufficient documentation

## 2017-11-29 MED ORDER — CLONIDINE HCL 0.1 MG PO TABS
0.3000 mg | ORAL_TABLET | Freq: Once | ORAL | Status: AC
  Administered 2017-11-29: 0.3 mg via ORAL
  Filled 2017-11-29: qty 3

## 2017-11-29 MED ORDER — METOPROLOL TARTRATE 25 MG PO TABS
25.0000 mg | ORAL_TABLET | Freq: Once | ORAL | Status: AC
  Administered 2017-11-29: 25 mg via ORAL
  Filled 2017-11-29: qty 1

## 2017-11-29 MED ORDER — CLONIDINE HCL 0.3 MG PO TABS: 0.3000 mg | ORAL_TABLET | Freq: Two times a day (BID) | ORAL | 0 refills | Status: DC

## 2017-11-29 MED ORDER — METOPROLOL TARTRATE 50 MG PO TABS: 25.0000 mg | ORAL_TABLET | Freq: Two times a day (BID) | ORAL | 0 refills | Status: DC

## 2017-11-29 NOTE — Discharge Instructions (Addendum)
Please pick up your blood pressure medications from the pharmacy today and begin taking them regularly as directed.  Help with your primary care doctor in the next week for a blood pressure check.  Return to the emergency department if you develop chest pain, shortness of breath, severe headache, vision changes, numbness, weakness or any other new or concerning symptoms.
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
## 2017-12-12 ENCOUNTER — Encounter (HOSPITAL_COMMUNITY): Payer: Self-pay | Admitting: *Deleted

## 2017-12-12 ENCOUNTER — Emergency Department (HOSPITAL_COMMUNITY)
Admission: EM | Admit: 2017-12-12 | Discharge: 2017-12-12 | Disposition: A | Discharge status: Home / Self Care | Payer: Self-pay | Attending: Emergency Medicine | Admitting: Emergency Medicine

## 2017-12-12 ENCOUNTER — Other Ambulatory Visit: Payer: Self-pay

## 2017-12-12 DIAGNOSIS — R000 Tachycardia, unspecified: Secondary | ICD-10-CM | POA: Insufficient documentation

## 2017-12-12 DIAGNOSIS — Z79899 Other long term (current) drug therapy: Secondary | ICD-10-CM | POA: Insufficient documentation

## 2017-12-12 DIAGNOSIS — I10 Essential (primary) hypertension: Secondary | ICD-10-CM | POA: Insufficient documentation

## 2017-12-12 DIAGNOSIS — Z87891 Personal history of nicotine dependence: Secondary | ICD-10-CM | POA: Insufficient documentation

## 2017-12-12 LAB — BASIC METABOLIC PANEL
ANION GAP: 14 (ref 5–15)
BUN: 11 mg/dL (ref 6–20)
CALCIUM: 9.3 mg/dL (ref 8.9–10.3)
CO2: 25 mmol/L (ref 22–32)
CREATININE: 1.29 mg/dL — AB (ref 0.61–1.24)
Chloride: 102 mmol/L (ref 98–111)
GFR calc Af Amer: >60 mL/min (ref >60)
Glucose, Bld: 276 mg/dL — ABNORMAL HIGH (ref 70–99)
Potassium: 3.1 mmol/L — ABNORMAL LOW (ref 3.5–5.1)
Sodium: 141 mmol/L (ref 135–145)

## 2017-12-12 LAB — CBC
HCT: 50.4 % (ref 39.0–52.0)
Hemoglobin: 16.1 g/dL (ref 13.0–17.0)
MCH: 28.1 pg (ref 26.0–34.0)
MCHC: 31.9 g/dL (ref 30.0–36.0)
MCV: 88 fL (ref 80.0–100.0)
NRBC: 0 % (ref 0.0–0.2)
PLATELETS: 220 10*3/uL (ref 150–400)
RBC: 5.73 MIL/uL (ref 4.22–5.81)
RDW: 14.1 % (ref 11.5–15.5)
WBC: 9.5 10*3/uL (ref 4.0–10.5)

## 2017-12-12 MED ORDER — METOPROLOL TARTRATE 25 MG PO TABS: 25.0000 mg | ORAL_TABLET | Freq: Two times a day (BID) | ORAL | 1 refills | Status: DC

## 2017-12-12 MED ORDER — POTASSIUM CHLORIDE CRYS ER 20 MEQ PO TBCR
40.0000 meq | EXTENDED_RELEASE_TABLET | Freq: Once | ORAL | Status: AC
  Administered 2017-12-12: 40 meq via ORAL
  Filled 2017-12-12: qty 2

## 2017-12-12 MED ORDER — CLONIDINE HCL 0.1 MG PO TABS
0.3000 mg | ORAL_TABLET | Freq: Once | ORAL | Status: AC
  Administered 2017-12-12: 0.3 mg via ORAL
  Filled 2017-12-12: qty 3

## 2017-12-12 MED ORDER — METOPROLOL TARTRATE 25 MG PO TABS
25.0000 mg | ORAL_TABLET | Freq: Once | ORAL | Status: AC
  Administered 2017-12-12: 25 mg via ORAL
  Filled 2017-12-12: qty 1

## 2017-12-12 MED ORDER — SODIUM CHLORIDE 0.9 % IV BOLUS
500.0000 mL | Freq: Once | INTRAVENOUS | Status: AC
  Administered 2017-12-12: 500 mL via INTRAVENOUS

## 2017-12-12 MED ORDER — CLONIDINE HCL 0.3 MG PO TABS: 0.3000 mg | ORAL_TABLET | Freq: Two times a day (BID) | ORAL | 1 refills | Status: DC

## 2017-12-12 MED ORDER — SODIUM CHLORIDE 0.9 % IV SOLN
INTRAVENOUS | Status: DC
  Administered 2017-12-12: 17:00:00 via INTRAVENOUS

## 2017-12-12 NOTE — ED Triage Notes (Signed)
Pt reports having tachycardia x 2 days when pt ran out of meds.  Pt reports hx of the same.  Pt states he is supposed to be on metoprolol and clonidine. Pt states had an appointment to see his MD but could not make it since he had to be in court. Hx chronic back pain and denies any other symptoms.

## 2017-12-12 NOTE — ED Notes (Signed)
Bed: WA16 Expected date:  Expected time:  Means of arrival:  Comments: Resus A 

## 2017-12-12 NOTE — ED Provider Notes (Signed)
[facility] DEPT Provider Note   CSN: [id number] Arrival date & time: 12/12/17  1530     History   Chief Complaint Chief Complaint  Patient presents with  . Tachycardia    HPI Travis [name] is a 52 y.o. male.  Patient presenting with a fast heart rate.  States he has been out of his medications for several days.  Is normally on the met appropriate wall and clonidine.  Blood pressures also been elevated.  Patient without any chest pain shortness of breath or any stroke symptoms no sick headache.  Patient has a history of chronic back pain no other complaints.     Past Medical History:  Diagnosis Date  . Anxiety    Panic attacks  . Chest pain    Hospital, March, 2014, negative enzymes, patient refused in-hospital  stress test, patient canceled outpatient stress test  . Constipation   . Degenerative disk disease   . Degenerative disk disease   . Depression   . GERD (gastroesophageal reflux disease)   . Hypertension   . Incontinence of urine   . Neuromuscular disorder ([place])   . Obesity   . Polysubstance abuse ([place])   . PTSD (post-traumatic stress disorder)   . Seizures ([place])   . Shortness of breath dyspnea    with exertion  . Spinal stenosis   . Spinal stenosis   . Tachycardia - pulse   . Tobacco abuse     Patient Active Problem List   Diagnosis Date Noted  . ARF (acute renal failure) ([place]) 09/01/2015  . Hypotension 09/01/2015  . Acute encephalopathy 09/01/2015  . Hematochezia 04/07/2014  . 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 Major depressive disorder, single episode, moderate ([place]) 10/12/2013  . Morbid obesity ([place]) 10/12/2013  . Essential hypertension 10/12/2013  . Chronic pain syndrome 10/12/2013  . MDD (major depressive disorder), recurrent episode, severe ([place]) 09/29/2013  . Rectal pain 08/10/2013  . Tachycardia 07/27/2013  . Urine incontinence 07/27/2013  . Rectal bleeding 07/27/2013  . Left arm pain 03/29/2013  .  Lumbar radicular pain 03/23/2013  . Bilateral shoulder pain 10/27/2012  . Smoking 10/27/2012  . Dental abscess 07/14/2012  . Hypertension   . Anxiety   . Spinal stenosis   . Tobacco abuse   . Obesity   . Dyslipidemia   . Polysubstance abuse ([place])   . Chest pain   . Grief 06/11/2012  . Panic attack 01/21/2012  . HYPERCHOLESTEROLEMIA 02/18/2006  . DEPRESSION 02/18/2006  . GERD 02/18/2006  . HEADACHE 02/18/2006    Past Surgical History:  Procedure Laterality Date  . COLONOSCOPY N/A 08/24/2014   Procedure: COLONOSCOPY;  Surgeon: Wilford Corner, MD;  Location: [facility] ENDOSCOPY;  Service: Endoscopy;  Laterality: N/A;  . ESOPHAGOGASTRODUODENOSCOPY (EGD) WITH PROPOFOL N/A 08/24/2014   Procedure: ESOPHAGOGASTRODUODENOSCOPY (EGD) WITH PROPOFOL;  Surgeon: Wilford Corner, MD;  Location: [facility] ENDOSCOPY;  Service: Endoscopy;  Laterality: N/A;  . WISDOM TOOTH EXTRACTION          Home Medications    Prior to Admission medications   Medication Sig Start Date End Date Taking? Authorizing Provider  cloNIDine (CATAPRES) 0.3 MG tablet Take 1 tablet (0.3 mg total) by mouth 2 (two) times daily. 11/29/17 12/29/17 Yes Jacqlyn Larsen, PA-C  cyclobenzaprine (FLEXERIL) 10 MG tablet Take 1 tablet (10 mg total) by mouth 2 (two) times daily as needed for muscle spasms. 11/12/17  Yes Wieters, Hallie C, PA-C  ibuprofen (ADVIL,MOTRIN) 200 MG tablet Take 800 mg by mouth every 6 (  six) hours as needed for headache or mild pain.   Yes [provider]  metoprolol tartrate (LOPRESSOR) 50 MG tablet Take 0.5 tablets (25 mg total) by mouth 2 (two) times daily. 11/29/17 12/29/17 Yes Jacqlyn Larsen, PA-C  ranitidine (ZANTAC) 150 MG tablet Take 150 mg by mouth at bedtime.    Yes [provider]  traMADol (ULTRAM) 50 MG tablet Take 1 tablet (50 mg total) by mouth every 6 (six) hours as needed for severe pain. 11/12/17  Yes Wieters, Hallie C, PA-C  cloNIDine (CATAPRES) 0.3 MG tablet Take 1 tablet (0.3 mg total)  by mouth 2 (two) times daily. 12/12/17   Fredia Sorrow, MD  diphenoxylate-atropine (LOMOTIL) 2.5-0.025 MG tablet Take 2 tablets by mouth 4 (four) times daily as needed for diarrhea or loose stools. Patient not taking: Reported on 12/12/2017 06/11/17   Orpah Greek, MD  metoprolol tartrate (LOPRESSOR) 25 MG tablet Take 1 tablet (25 mg total) by mouth 2 (two) times daily. 12/12/17   Fredia Sorrow, MD  potassium chloride SA (K-DUR,KLOR-CON) 20 MEQ tablet Take 1 tablet (20 mEq total) by mouth daily. Patient not taking: Reported on 12/12/2017 06/11/17   Orpah Greek, MD    Family History Family History  Problem Relation Age of Onset  . Stroke Mother   . Hypertension Mother   . Hyperlipidemia Mother   . Stroke Father   . Hypertension Father   . Dementia Father   . Diabetes Father   . Heart disease Father   . Hyperlipidemia Father   . Cancer Sister        brain  . Diabetes Sister   . Heart disease Sister   . Hyperlipidemia Sister   . Hypertension Sister   . Stroke Sister   . Heart disease Brother   . Hyperlipidemia Brother     Social History Social History   Tobacco Use  . Smoking status: Former Smoker    Packs/day: 0.02    Years: 27.00    Pack years: 0.54    Types: Cigarettes    Last attempt to quit: 12/12/2016    Years since quitting: 1.0  . Smokeless tobacco: Never Used  . Tobacco comment: "quit April 2016, I havwe one every now and then"  Substance Use Topics  . Alcohol use: No  . Drug use: No     Allergies   Blueberry flavor; Penicillins; Robaxin [methocarbamol]; and Toradol [ketorolac tromethamine]   Review of Systems Review of Systems  Constitutional: Negative for fever.  HENT: Negative for congestion.   Eyes: Negative for visual disturbance.  Respiratory: Negative for shortness of breath.   Cardiovascular: Negative for chest pain.  Gastrointestinal: Negative for abdominal pain.  Genitourinary: Negative for dysuria.    Musculoskeletal: Positive for back pain. Negative for neck pain.  Skin: Negative for rash.  Neurological: Negative for syncope, weakness, numbness and headaches.  Hematological: Does not bruise/bleed easily.  Psychiatric/Behavioral: Negative for confusion.     Physical Exam Updated Vital Signs BP (!) 132/103   Pulse (!) 106   Temp 98 F (36.7 C) (Oral)   Resp 19   Ht 1.803 m (5' 11" )   Wt 117 kg   SpO2 96%   BMI 35.98 kg/m   Physical Exam  Constitutional: He is oriented to person, place, and time. He appears well-developed and well-nourished. No distress.  HENT:  Head: Normocephalic and atraumatic.  Mouth/Throat: Oropharynx is clear and moist.  Eyes: Pupils are equal, round, and reactive to light. Conjunctivae  and EOM are normal.  Neck: Neck supple.  Cardiovascular:  Regular tachycardic  Pulmonary/Chest: Effort normal and breath sounds normal. No respiratory distress.  Abdominal: Soft. Bowel sounds are normal. There is no tenderness.  Musculoskeletal: Normal range of motion. He exhibits no edema.  Neurological: He is alert and oriented to person, place, and time. No cranial nerve deficit or sensory deficit. He exhibits normal muscle tone. Coordination normal.  Skin: Skin is warm.  Nursing note and vitals reviewed.    ED Treatments / Results  Labs (all labs ordered are listed, but only abnormal results are displayed) Labs Reviewed  BASIC METABOLIC PANEL - Abnormal; Notable for the following components:      Result Value   Potassium 3.1 (*)    Glucose, Bld 276 (*)    Creatinine, Ser 1.29 (*)    All other components within normal limits  CBC    EKG EKG Interpretation  Date/Time:  Friday December 12 2017 15:48:33 EDT Ventricular Rate:  145 PR Interval:    QRS Duration: 99 QT Interval:  339 QTC Calculation: 527 R Axis:   -10 Text Interpretation:  Sinus tachycardia Borderline low voltage, extremity leads Prolonged QT interval Baseline wander in lead(s) V1  Confirmed by Fredia Sorrow [id number]) on 12/12/2017 4:20:02 PM   Radiology No results found.  Procedures Procedures (including critical care time)  Medications Ordered in ED Medications  0.9 %  sodium chloride infusion ( Intravenous New Bag/Given 12/12/17 1641)  sodium chloride 0.9 % bolus 500 mL (0 mLs Intravenous Stopped 12/12/17 1737)  metoprolol tartrate (LOPRESSOR) tablet 25 mg (25 mg Oral Given 12/12/17 1638)  cloNIDine (CATAPRES) tablet 0.3 mg (0.3 mg Oral Given 12/12/17 1639)  potassium chloride SA (K-DUR,KLOR-CON) CR tablet 40 mEq (40 mEq Oral Given 12/12/17 1848)     Initial Impression / Assessment and Plan / ED Course  I have reviewed the triage vital signs and the nursing notes.  Pertinent labs & imaging results that were available during my care of the patient were reviewed by me and considered in my medical decision making (see chart for details).     Work-up here showed some mild hypokalemia.  Patient also has some renal insufficiency.  Potassium is above 3 nothing critical patient's heart rate and blood pressure improved significantly restarting his meds met appropriate wall and clonidine.  We will continue them prescription provided.  To follow-up with his regular doctor for further adjustments of his blood pressure medicines as needed.  Patient nontoxic no acute distress.  Patient without any symptoms.  In addition blood sugars are elevated close follow-up with primary care doctor will be important for that as well.  Final Clinical Impressions(s) / ED Diagnoses   Final diagnoses:  Sinus tachycardia  Essential hypertension    ED Discharge Orders         Ordered    metoprolol tartrate (LOPRESSOR) 25 MG tablet  2 times daily     12/12/17 2045    cloNIDine (CATAPRES) 0.3 MG tablet  2 times daily     12/12/17 2045           Fredia Sorrow, MD 12/12/17 2053

## 2017-12-12 NOTE — Discharge Instructions (Addendum)
Restart your antihypertensive meds as per prescription.  Make an appointment to follow-up with your regular doctor for rechecks.  Return for any new or worse symptoms.

## 2018-01-23 ENCOUNTER — Encounter (HOSPITAL_COMMUNITY): Payer: Self-pay

## 2018-01-23 ENCOUNTER — Ambulatory Visit (INDEPENDENT_AMBULATORY_CARE_PROVIDER_SITE_OTHER): Payer: Self-pay

## 2018-01-23 ENCOUNTER — Ambulatory Visit (HOSPITAL_COMMUNITY)
Admission: EM | Admit: 2018-01-23 | Discharge: 2018-01-23 | Disposition: A | Discharge status: Home / Self Care | Payer: Self-pay | Attending: Family Medicine | Admitting: Family Medicine

## 2018-01-23 DIAGNOSIS — W19XXXA Unspecified fall, initial encounter: Secondary | ICD-10-CM

## 2018-01-23 DIAGNOSIS — S2232XA Fracture of one rib, left side, initial encounter for closed fracture: Secondary | ICD-10-CM

## 2018-01-23 MED ORDER — TRAMADOL HCL 50 MG PO TABS: 50.0000 mg | ORAL_TABLET | Freq: Four times a day (QID) | ORAL | 0 refills | Status: DC | PRN

## 2018-01-23 NOTE — Discharge Instructions (Signed)
As discussed possible rib fracture.  Start ibuprofen 800 mg 3 times a day.  Tylenol as needed for additional pain relief.  I have called in some tramadol, you can take for severe pain. Please start using your incentive spirometry to prevent pneumonia. This can take 6-8 weeks to completely resolve. Follow up with PCP for monitoring. If experience sudden chest pain, shortness of breath, trouble breathing, go to the emergency department for further evaluation.

## 2018-01-23 NOTE — ED Triage Notes (Signed)
Pt presents with complaints of fall yesterday. Reports landing on his left rib, concerned for rib fractures. Complains of severe pain.
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
## 2018-01-30 ENCOUNTER — Ambulatory Visit (INDEPENDENT_AMBULATORY_CARE_PROVIDER_SITE_OTHER): Payer: Self-pay

## 2018-01-30 ENCOUNTER — Ambulatory Visit (HOSPITAL_COMMUNITY)
Admission: EM | Admit: 2018-01-30 | Discharge: 2018-01-30 | Disposition: A | Discharge status: Home / Self Care | Payer: Self-pay | Attending: Family Medicine | Admitting: Family Medicine

## 2018-01-30 ENCOUNTER — Encounter (HOSPITAL_COMMUNITY): Payer: Self-pay | Admitting: Emergency Medicine

## 2018-01-30 DIAGNOSIS — S2242XA Multiple fractures of ribs, left side, initial encounter for closed fracture: Secondary | ICD-10-CM

## 2018-01-30 DIAGNOSIS — S299XXA Unspecified injury of thorax, initial encounter: Secondary | ICD-10-CM

## 2018-01-30 MED ORDER — TRAMADOL HCL 50 MG PO TABS: 50.0000 mg | ORAL_TABLET | Freq: Four times a day (QID) | ORAL | 0 refills | Status: DC | PRN

## 2018-01-30 NOTE — Discharge Instructions (Signed)
Be aware, pain medications may cause drowsiness. Please do not drive, operate heavy machinery or make important decisions while on this medication, it can cloud your judgement.  

## 2018-01-30 NOTE — ED Triage Notes (Signed)
Pt seen here on 01/23/2018 for a fx of 7th left rib fx  Reports he fell again last night onto a tv stand and re-inj his left rib cage  Pain increases w/activity  Ambulatory... Gilmer MorCane is present upon arrival  Talking in complete sentences  A&O x4... NAD... Ambulatory

## 2018-01-30 NOTE — ED Provider Notes (Signed)
[facility]ENTER   [id number] 01/30/18 Arrival Time: 4098  ASSESSMENT & PLAN:  1. Injury of chest wall, initial encounter   2. Closed fracture of multiple ribs of left side, initial encounter    I have personally viewed the imaging studies ordered this visit. Left rib fractures visualized. No pneumothorax seen.  Imaging: Dg Ribs Unilateral W/chest Left  Result Date: 01/30/2018 CLINICAL DATA:  Recent diagnosis of probable left seventh rib fracture. Fall last night with increased lateral rib pain and pain with breathing. EXAM: LEFT RIBS AND CHEST - 3+ VIEW COMPARISON:  None. FINDINGS: The heart size and mediastinal contours are within normal limits. There is mild bibasilar atelectasis. There is no evidence of pulmonary edema, consolidation, pneumothorax, nodule or pleural fluid. Left rib films show mildly displaced and angulated fractures involving anterolateral aspects of the left sixth and seventh ribs. There may be a subtle nondisplaced fracture of the left eighth rib. IMPRESSION: Mildly displaced and angulated fractures of the left sixth and seventh ribs. Possible subtle nondisplaced fracture of the left eighth rib. No associated pneumothorax or pleural fluid. Electronically Signed   By: Irish Lack M.D.   On: 01/30/2018 11:21   Meds ordered this encounter  Medications  . traMADol (ULTRAM) 50 MG tablet    Sig: Take 1 tablet (50 mg total) by mouth every 6 (six) hours as needed for severe pain.    Dispense:  20 tablet    Refill:  0   Continue with incentive spirometer.  Follow-up Information    Schedule an appointment as soon as possible for a visit  with [name], Chales Abrahams, NP.   Contact information: [address] [name] [address] [phone]          [place] Controlled Substances Registry consulted for this patient. I feel the risk/benefit ratio today is favorable for proceeding with this prescription for a controlled substance. Medication sedation precautions  given.  Reviewed expectations re: course of current medical issues. Questions answered. Outlined signs and symptoms indicating need for more acute intervention. Patient verbalized understanding. After Visit Summary given.  SUBJECTIVE: History from: patient. Seen here on 01/23/2018. Notes and imaging reviewed.  Travis [name] is a 52 y.o. male who was recently dx with a L rib fracture here on 11/22 returns after reportedly falling against a TV table last evening thus re-injuring his L chest wall. Pain localized to L chest wall and worsened by movement, ambulation, and deep breathing. Has incentive spirometer and has been using up until last evening. No SOB reported. Afebrile. Symptoms have progressed to a point and plateaued since injury last evening. Associated symptoms: none reported. No n/v. Out of Tramadol that he was using to control pain. No OTC treatment. Extremity sensation changes or weakness: none.  Social History   Tobacco Use  Smoking Status Former Smoker  . Packs/day: 0.02  . Years: 27.00  . Pack years: 0.54  . Types: Cigarettes  . Last attempt to quit: 12/12/2016  . Years since quitting: 1.1  Smokeless Tobacco Never Used  Tobacco Comment   "quit April 2016, I havwe one every now and then"    Past Surgical History:  Procedure Laterality Date  . COLONOSCOPY N/A 08/24/2014   Procedure: COLONOSCOPY;  Surgeon: Charlott Rakes, MD;  Location: [facility] ENDOSCOPY;  Service: Endoscopy;  Laterality: N/A;  . ESOPHAGOGASTRODUODENOSCOPY (EGD) WITH PROPOFOL N/A 08/24/2014   Procedure: ESOPHAGOGASTRODUODENOSCOPY (EGD) WITH PROPOFOL;  Surgeon: Charlott Rakes, MD;  Location: [facility] ENDOSCOPY;  Service: Endoscopy;  Laterality: N/A;  .  WISDOM TOOTH EXTRACTION       ROS: As per HPI. All other systems negative.    OBJECTIVE:  Vitals:   01/30/18 1035  BP: 126/87  Pulse: 72  Resp: 16  Temp: 97.8 F (36.6 C)  TempSrc: Oral  SpO2: 100%    General appearance: alert; appears  uncomfortable here today HEENT: [place]; AT Neck: supple with FROM Lungs: shallow breaths but CTAB and without wheezing; unlabored breathing and able to speak full sentences Ribs: very tender to palpation over L lower ribs; no gross deformities of chest wall; obese Abd: obese; soft; non-tender Extremities: no LE edema Skin: warm and dry; no visible rashes Neurologic: gait normal and slow with a cane Psychological: alert and cooperative; normal mood and affect  Allergies  Allergen Reactions  . Blueberry Flavor Hives  . Penicillins Hives, Itching and Other (See Comments)    Hallucinations. Has patient had a PCN reaction causing immediate rash, facial/tongue/throat swelling, SOB or lightheadedness with hypotension:YES Has patient had a PCN reaction causing severe rash involving mucus membranes or skin necrosis: NO Has patient had a PCN reaction that required hospitalization: yes Has patient had a PCN reaction occurring within the last 10 years: NO If all of the above answers are "NO", then may proceed with Cephalosporin use.  . Robaxin [Methocarbamol] Palpitations  . Toradol [Ketorolac Tromethamine] Hives and Other (See Comments)    Headache    Past Medical History:  Diagnosis Date  . Anxiety    Panic attacks  . Chest pain    Hospital, March, 2014, negative enzymes, patient refused in-hospital  stress test, patient canceled outpatient stress test  . Constipation   . Degenerative disk disease   . Degenerative disk disease   . Depression   . GERD (gastroesophageal reflux disease)   . Hypertension   . Incontinence of urine   . Neuromuscular disorder (HCC)   . Obesity   . Polysubstance abuse (HCC)   . PTSD (post-traumatic stress disorder)   . Seizures (HCC)   . Shortness of breath dyspnea    with exertion  . Spinal stenosis   . Spinal stenosis   . Tachycardia - pulse   . Tobacco abuse    Social History   Socioeconomic History  . Marital status: Divorced    Spouse name: Not  on file  . Number of children: Not on file  . Years of education: Not on file  . Highest education level: Not on file  Occupational History  . Not on file  Social Needs  . Financial resource strain: Not on file  . Food insecurity:    Worry: Not on file    Inability: Not on file  . Transportation needs:    Medical: Not on file    Non-medical: Not on file  Tobacco Use  . Smoking status: Former Smoker    Packs/day: 0.02    Years: 27.00    Pack years: 0.54    Types: Cigarettes    Last attempt to quit: 12/12/2016    Years since quitting: 1.1  . Smokeless tobacco: Never Used  . Tobacco comment: "quit April 2016, I havwe one every now and then"  Substance and Sexual Activity  . Alcohol use: No  . Drug use: No  . Sexual activity: Never  Lifestyle  . Physical activity:    Days per week: Not on file    Minutes per session: Not on file  . Stress: Not on file  Relationships  . Social connections:  Talks on phone: Not on file    Gets together: Not on file    Attends religious service: Not on file    Active member of club or organization: Not on file    Attends meetings of clubs or organizations: Not on file    Relationship status: Not on file  Other Topics Concern  . Not on file  Social History Narrative  . Not on file   Family History  Problem Relation Age of Onset  . Stroke Mother   . Hypertension Mother   . Hyperlipidemia Mother   . Stroke Father   . Hypertension Father   . Dementia Father   . Diabetes Father   . Heart disease Father   . Hyperlipidemia Father   . Cancer Sister        brain  . Diabetes Sister   . Heart disease Sister   . Hyperlipidemia Sister   . Hypertension Sister   . Stroke Sister   . Heart disease Brother   . Hyperlipidemia Brother    Past Surgical History:  Procedure Laterality Date  . COLONOSCOPY N/A 08/24/2014   Procedure: COLONOSCOPY;  Surgeon: Charlott Rakes, MD;  Location: [facility] ENDOSCOPY;  Service: Endoscopy;  Laterality: N/A;  .  ESOPHAGOGASTRODUODENOSCOPY (EGD) WITH PROPOFOL N/A 08/24/2014   Procedure: ESOPHAGOGASTRODUODENOSCOPY (EGD) WITH PROPOFOL;  Surgeon: Charlott Rakes, MD;  Location: [facility] ENDOSCOPY;  Service: Endoscopy;  Laterality: N/A;  . WISDOM TOOTH EXTRACTION        Mardella Layman, MD 01/30/18 1349

## 2018-02-23 ENCOUNTER — Encounter (HOSPITAL_COMMUNITY): Payer: Self-pay

## 2018-02-23 ENCOUNTER — Ambulatory Visit (HOSPITAL_COMMUNITY)
Admission: EM | Admit: 2018-02-23 | Discharge: 2018-02-23 | Disposition: A | Discharge status: Home / Self Care | Payer: Self-pay | Attending: Family Medicine | Admitting: Family Medicine

## 2018-02-23 ENCOUNTER — Other Ambulatory Visit: Payer: Self-pay

## 2018-02-23 DIAGNOSIS — I10 Essential (primary) hypertension: Secondary | ICD-10-CM

## 2018-02-23 DIAGNOSIS — R0789 Other chest pain: Secondary | ICD-10-CM

## 2018-02-23 MED ORDER — TRAMADOL HCL 50 MG PO TABS: 50.0000 mg | ORAL_TABLET | Freq: Four times a day (QID) | ORAL | 0 refills | Status: DC | PRN

## 2018-02-23 MED ORDER — METOPROLOL TARTRATE 25 MG PO TABS: 25.0000 mg | ORAL_TABLET | Freq: Two times a day (BID) | ORAL | 1 refills | Status: DC

## 2018-02-23 MED ORDER — CLONIDINE HCL 0.3 MG PO TABS: 0.3000 mg | ORAL_TABLET | Freq: Two times a day (BID) | ORAL | 1 refills | Status: DC
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
## 2018-02-28 ENCOUNTER — Observation Stay (HOSPITAL_COMMUNITY)
Admission: EM | Admit: 2018-02-28 | Discharge: 2018-03-01 | Disposition: A | Discharge status: Home / Self Care | Payer: Self-pay | Attending: Family Medicine | Admitting: Family Medicine

## 2018-02-28 ENCOUNTER — Other Ambulatory Visit: Payer: Self-pay

## 2018-02-28 ENCOUNTER — Encounter (HOSPITAL_COMMUNITY): Payer: Self-pay | Admitting: *Deleted

## 2018-02-28 DIAGNOSIS — T50905A Adverse effect of unspecified drugs, medicaments and biological substances, initial encounter: Secondary | ICD-10-CM

## 2018-02-28 DIAGNOSIS — R001 Bradycardia, unspecified: Principal | ICD-10-CM | POA: Diagnosis present

## 2018-02-28 DIAGNOSIS — I10 Essential (primary) hypertension: Secondary | ICD-10-CM | POA: Diagnosis present

## 2018-02-28 DIAGNOSIS — Z87891 Personal history of nicotine dependence: Secondary | ICD-10-CM | POA: Insufficient documentation

## 2018-02-28 DIAGNOSIS — R739 Hyperglycemia, unspecified: Secondary | ICD-10-CM | POA: Diagnosis present

## 2018-02-28 DIAGNOSIS — Z79899 Other long term (current) drug therapy: Secondary | ICD-10-CM | POA: Insufficient documentation

## 2018-02-28 DIAGNOSIS — T465X1A Poisoning by other antihypertensive drugs, accidental (unintentional), initial encounter: Secondary | ICD-10-CM | POA: Diagnosis present

## 2018-02-28 DIAGNOSIS — T50904A Poisoning by unspecified drugs, medicaments and biological substances, undetermined, initial encounter: Secondary | ICD-10-CM

## 2018-02-28 HISTORY — DX: Suicide attempt, initial encounter: T14.91XA

## 2018-02-28 LAB — COMPREHENSIVE METABOLIC PANEL
ALT: 19 U/L (ref 0–44)
AST: 27 U/L (ref 15–41)
Albumin: 3 g/dL — ABNORMAL LOW (ref 3.5–5.0)
Alkaline Phosphatase: 109 U/L (ref 38–126)
Anion gap: 6 (ref 5–15)
CHLORIDE: 105 mmol/L (ref 98–111)
CO2: 26 mmol/L (ref 22–32)
CREATININE: 0.78 mg/dL (ref 0.61–1.24)
Calcium: 8.4 mg/dL — ABNORMAL LOW (ref 8.9–10.3)
GFR calc non Af Amer: >60 mL/min (ref >60)
Glucose, Bld: 238 mg/dL — ABNORMAL HIGH (ref 70–99)
Potassium: 3.4 mmol/L — ABNORMAL LOW (ref 3.5–5.1)
Sodium: 137 mmol/L (ref 135–145)
Total Bilirubin: 0.3 mg/dL (ref 0.3–1.2)
Total Protein: 6.9 g/dL (ref 6.5–8.1)

## 2018-02-28 LAB — CBC WITH DIFFERENTIAL/PLATELET
Abs Immature Granulocytes: 0.03 10*3/uL (ref 0.00–0.07)
BASOS PCT: 1 %
Basophils Absolute: 0.1 10*3/uL (ref 0.0–0.1)
EOS ABS: 0.2 10*3/uL (ref 0.0–0.5)
Eosinophils Relative: 2 %
HEMATOCRIT: 42.7 % (ref 39.0–52.0)
Hemoglobin: 13.6 g/dL (ref 13.0–17.0)
IMMATURE GRANULOCYTES: 0 %
LYMPHS ABS: 2.5 10*3/uL (ref 0.7–4.0)
Lymphocytes Relative: 28 %
MCH: 28.7 pg (ref 26.0–34.0)
MCHC: 31.9 g/dL (ref 30.0–36.0)
MCV: 90.1 fL (ref 80.0–100.0)
MONO ABS: 0.8 10*3/uL (ref 0.1–1.0)
MONOS PCT: 8 %
Neutro Abs: 5.6 10*3/uL (ref 1.7–7.7)
Neutrophils Relative %: 61 %
PLATELETS: ADEQUATE 10*3/uL (ref 150–400)
RBC: 4.74 MIL/uL (ref 4.22–5.81)
RDW: 13.2 % (ref 11.5–15.5)
WBC: 9.1 10*3/uL (ref 4.0–10.5)
nRBC: 0 % (ref 0.0–0.2)

## 2018-02-28 LAB — URINALYSIS, ROUTINE W REFLEX MICROSCOPIC
BILIRUBIN URINE: NEGATIVE
Bacteria, UA: NONE SEEN
Glucose, UA: >=500 mg/dL — AB
HGB URINE DIPSTICK: NEGATIVE
Ketones, ur: NEGATIVE mg/dL
LEUKOCYTES UA: NEGATIVE
Nitrite: NEGATIVE
PH: 7 (ref 5.0–8.0)
Protein, ur: NEGATIVE mg/dL
Specific Gravity, Urine: 1.005 (ref 1.005–1.030)

## 2018-02-28 LAB — ETHANOL

## 2018-02-28 LAB — RAPID URINE DRUG SCREEN, HOSP PERFORMED
AMPHETAMINES: NOT DETECTED
BARBITURATES: NOT DETECTED
Benzodiazepines: POSITIVE — AB
COCAINE: NOT DETECTED
OPIATES: NOT DETECTED
TETRAHYDROCANNABINOL: NOT DETECTED

## 2018-02-28 LAB — LIPASE, BLOOD: LIPASE: 30 U/L (ref 11–51)

## 2018-02-28 LAB — SALICYLATE LEVEL: Salicylate Lvl: <7 mg/dL (ref 2.8–30.0)

## 2018-02-28 LAB — ACETAMINOPHEN LEVEL: Acetaminophen (Tylenol), Serum: <10 ug/mL — ABNORMAL LOW (ref 10–30)

## 2018-02-28 MED ORDER — SODIUM CHLORIDE 0.9 % IV BOLUS
1000.0000 mL | Freq: Once | INTRAVENOUS | Status: AC
  Administered 2018-02-28: 1000 mL via INTRAVENOUS

## 2018-02-28 NOTE — ED Provider Notes (Signed)
[facility] EMERGENCY DEPARTMENT Provider Note   CSN: [id number] Arrival date & time: 02/28/18  2102     History   Chief Complaint Chief Complaint  Patient presents with  . V70.1    HPI Irene LimboJames Cassis is a 52 y.o. male.  HPI Presents after intentional ingestion of 10 clonidine tablets. Patient knowledges multiple medical issues, but his most pressing concern is chronic pain, which is unrelenting. Today he was in a disagreement with his mother, and states that he took 10 clonidine tablets in an attempt to irritate her. Some question about whether the patient endorsed suicidal ideation to EMS providers, but here he denies any attempt at self-harm, suicide. Acknowledges worsening of his chronic pain in his back, ribs, neck, without appreciable change, without appreciable improvement in spite of taking his regular medication regimen. He denies other new physical concerns including new pain, fever, dyspnea. Past Medical History:  Diagnosis Date  . Anxiety    Panic attacks  . Chest pain    Hospital, March, 2014, negative enzymes, patient refused in-hospital  stress test, patient canceled outpatient stress test  . Constipation   . Degenerative disk disease   . Degenerative disk disease   . Depression   . GERD (gastroesophageal reflux disease)   . Hypertension   . Incontinence of urine   . Neuromuscular disorder (HCC)   . Obesity   . Polysubstance abuse (HCC)   . PTSD (post-traumatic stress disorder)   . Seizures (HCC)   . Shortness of breath dyspnea    with exertion  . Spinal stenosis   . Spinal stenosis   . Suicide attempt (HCC)   . Tachycardia - pulse   . Tobacco abuse     Patient Active Problem List   Diagnosis Date Noted  . ARF (acute renal failure) (HCC) 09/01/2015  . Hypotension 09/01/2015  . Acute encephalopathy 09/01/2015  . Hematochezia 04/07/2014  . 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 Major depressive disorder, single episode, moderate (HCC)  10/12/2013  . Morbid obesity (HCC) 10/12/2013  . Essential hypertension 10/12/2013  . Chronic pain syndrome 10/12/2013  . MDD (major depressive disorder), recurrent episode, severe (HCC) 09/29/2013  . Rectal pain 08/10/2013  . Tachycardia 07/27/2013  . Urine incontinence 07/27/2013  . Rectal bleeding 07/27/2013  . Left arm pain 03/29/2013  . Lumbar radicular pain 03/23/2013  . Bilateral shoulder pain 10/27/2012  . Smoking 10/27/2012  . Dental abscess 07/14/2012  . Hypertension   . Anxiety   . Spinal stenosis   . Tobacco abuse   . Obesity   . Dyslipidemia   . Polysubstance abuse (HCC)   . Chest pain   . Grief 06/11/2012  . Panic attack 01/21/2012  . HYPERCHOLESTEROLEMIA 02/18/2006  . DEPRESSION 02/18/2006  . GERD 02/18/2006  . HEADACHE 02/18/2006    Past Surgical History:  Procedure Laterality Date  . COLONOSCOPY N/A 08/24/2014   Procedure: COLONOSCOPY;  Surgeon: Charlott RakesVincent Schooler, MD;  [facility];  Service: Endoscopy;  Laterality: N/A;  . ESOPHAGOGASTRODUODENOSCOPY (EGD) WITH PROPOFOL N/A 08/24/2014   Procedure: ESOPHAGOGASTRODUODENOSCOPY (EGD) WITH PROPOFOL;  Surgeon: Charlott RakesVincent Schooler, MD;  [facility] END[facility];  Service: Endoscopy;  Laterality: N/A;  . WISDOM TOOTH EXTRACTION          Home Medications    Prior to Admission medications   Medication Sig Start Date End Date Taking? Authorizing Provider  cloNIDine (CATAPRES) 0.3 MG tablet Take 1 tablet (0.3 mg total) by mouth 2 (two) times daily. 02/23/18   Mardella LaymanHagler, Brian, MD  diphenoxylate-atropine (LOMOTIL) 2.5-0.025 MG tablet Take 2 tablets by mouth 4 (four) times daily as needed for diarrhea or loose stools. Patient not taking: Reported on 12/12/2017 06/11/17   Gilda CreasePollina, Christopher J, MD  metoprolol tartrate (LOPRESSOR) 25 MG tablet Take 1 tablet (25 mg total) by mouth 2 (two) times daily. 02/23/18   Mardella LaymanHagler, Brian, MD  metoprolol tartrate (LOPRESSOR) 50 MG tablet Take 0.5 tablets (25 mg total) by mouth 2  (two) times daily. 11/29/17 12/29/17  Dartha LodgeFord, Kelsey N, PA-C  potassium chloride SA (K-DUR,KLOR-CON) 20 MEQ tablet Take 1 tablet (20 mEq total) by mouth daily. 06/11/17   Gilda CreasePollina, Christopher J, MD  ranitidine (ZANTAC) 150 MG tablet Take 150 mg by mouth at bedtime.     [provider]  traMADol (ULTRAM) 50 MG tablet Take 1 tablet (50 mg total) by mouth every 6 (six) hours as needed for severe pain. 02/23/18   Mardella LaymanHagler, Brian, MD    Family History Family History  Problem Relation Age of Onset  . Stroke Mother   . Hypertension Mother   . Hyperlipidemia Mother   . Stroke Father   . Hypertension Father   . Dementia Father   . Diabetes Father   . Heart disease Father   . Hyperlipidemia Father   . Cancer Sister        brain  . Diabetes Sister   . Heart disease Sister   . Hyperlipidemia Sister   . Hypertension Sister   . Stroke Sister   . Heart disease Brother   . Hyperlipidemia Brother     Social History Social History   Tobacco Use  . Smoking status: Former Smoker    Packs/day: 0.02    Years: 27.00    Pack years: 0.54    Types: Cigarettes    Last attempt to quit: 12/12/2016    Years since quitting: 1.2  . Smokeless tobacco: Never Used  . Tobacco comment: "quit April 2016, I havwe one every now and then"  Substance Use Topics  . Alcohol use: No  . Drug use: No     Allergies   Blueberry flavor; Penicillins; Robaxin [methocarbamol]; and Toradol [ketorolac tromethamine]   Review of Systems Review of Systems  Constitutional:       Per HPI, otherwise negative  HENT:       Per HPI, otherwise negative  Respiratory:       Per HPI, otherwise negative  Cardiovascular:       Per HPI, otherwise negative  Gastrointestinal: Negative for vomiting.  Endocrine:       Negative aside from HPI  Genitourinary:       Neg aside from HPI   Musculoskeletal:       Per HPI, otherwise negative  Skin: Negative.   Neurological: Negative for syncope.  Psychiatric/Behavioral:  Positive for dysphoric mood. The patient is nervous/anxious.      Physical Exam Updated Vital Signs BP (!) 149/82   Pulse (!) 47   Temp 97.9 F (36.6 C) (Oral)   Resp 20   Ht 5\' 11"  (1.803 m)   Wt 113.4 kg   SpO2 96%   BMI 34.87 kg/m   Physical Exam Vitals signs and nursing note reviewed.  Constitutional:      General: He is not in acute distress.    Appearance: He is well-developed.  HENT:     Head: Normocephalic and atraumatic.  Eyes:     Conjunctiva/sclera: Conjunctivae normal.  Cardiovascular:     Rate and Rhythm: Normal rate  and regular rhythm.  Pulmonary:     Effort: Pulmonary effort is normal. No respiratory distress.     Breath sounds: No stridor.  Abdominal:     General: There is no distension.  Skin:    General: Skin is warm and dry.  Neurological:     Mental Status: He is alert and oriented to person, place, and time.  Psychiatric:        Mood and Affect: Mood is anxious.        Thought Content: Thought content does not include suicidal ideation.        Cognition and Memory: Cognition is not impaired.      ED Treatments / Results  Labs (all labs ordered are listed, but only abnormal results are displayed) Labs Reviewed  COMPREHENSIVE METABOLIC PANEL  ETHANOL  LIPASE, BLOOD  CBC WITH DIFFERENTIAL/PLATELET  URINALYSIS, ROUTINE W REFLEX MICROSCOPIC  RAPID URINE DRUG SCREEN, HOSP PERFORMED  ACETAMINOPHEN LEVEL  SALICYLATE LEVEL    EKG EKG Interpretation  Date/Time:  Saturday February 28 2018 21:18:58 EST Ventricular Rate:  53 PR Interval:    QRS Duration: 106 QT Interval:  456 QTC Calculation: 429 R Axis:   30 Text Interpretation:  Sinus rhythm Artifact Abnormal ekg Confirmed by Gerhard Munch [id number]) on 02/28/2018 9:22:10 PM   Procedures Procedures (including critical care time)  Medications Ordered in ED Medications  sodium chloride 0.9 % bolus 1,000 mL (1,000 mLs Intravenous New Bag/Given 02/28/18 2147)     Initial Impression  / Assessment and Plan / ED Course  I have reviewed the triage vital signs and the nursing notes.  Pertinent labs & imaging results that were available during my care of the patient were reviewed by me and considered in my medical decision making (see chart for details).    After the initial evaluation nursing staff discussed clonidine toxicity with poison control, we discussed recommendations including IV fluids, monitoring for 6 hours, repeat assessment.   This patient with chronic pain presents after intentional ingestion of 10 clonidine tablets. Intention not entirely clear, though the patient explicitly states he was trying to irritate a family member. However, given concern for possible suicidal ideation, the patient was monitored, required additional monitoring, with anticipated psychiatric evaluation once he is medically clear, 6 hours.  Final Clinical Impressions(s) / ED Diagnoses  Tensional overdose, initial encounter   Gerhard Munch, MD 02/28/18 2156

## 2018-02-28 NOTE — ED Notes (Signed)
Poison control called Recommendation:  Observation for 6 hours Continuous cardiac monitoring EKG done, IV fluids for bradycardia Acetaminophen and salicylate level checked 4 hours after

## 2018-02-28 NOTE — ED Notes (Signed)
Pt wanded by security and has some belongings with security.

## 2018-02-28 NOTE — ED Notes (Signed)
Security called to room to wand patient and to secure knife patient has on his persons.

## 2018-02-28 NOTE — ED Triage Notes (Signed)
Pt brought in by [facility] after admittting to taking 10 clonidine at 1930. Pt currently denies SI.  "did it to aggravate my mother"

## 2018-03-01 ENCOUNTER — Encounter (HOSPITAL_COMMUNITY): Payer: Self-pay | Admitting: Family Medicine

## 2018-03-01 DIAGNOSIS — T465X1A Poisoning by other antihypertensive drugs, accidental (unintentional), initial encounter: Secondary | ICD-10-CM | POA: Diagnosis present

## 2018-03-01 DIAGNOSIS — R739 Hyperglycemia, unspecified: Secondary | ICD-10-CM | POA: Diagnosis present

## 2018-03-01 DIAGNOSIS — R001 Bradycardia, unspecified: Secondary | ICD-10-CM

## 2018-03-01 DIAGNOSIS — I10 Essential (primary) hypertension: Secondary | ICD-10-CM

## 2018-03-01 DIAGNOSIS — T50905A Adverse effect of unspecified drugs, medicaments and biological substances, initial encounter: Secondary | ICD-10-CM

## 2018-03-01 DIAGNOSIS — T465X4A Poisoning by other antihypertensive drugs, undetermined, initial encounter: Secondary | ICD-10-CM

## 2018-03-01 LAB — GLUCOSE, CAPILLARY: Glucose-Capillary: 133 mg/dL — ABNORMAL HIGH (ref 70–99)

## 2018-03-01 MED ORDER — ONDANSETRON HCL 4 MG PO TABS: 4.0000 mg | ORAL_TABLET | Freq: Four times a day (QID) | ORAL | Status: DC | PRN

## 2018-03-01 MED ORDER — SODIUM CHLORIDE 0.9% FLUSH: 3.0000 mL | Freq: Two times a day (BID) | INTRAVENOUS | Status: DC

## 2018-03-01 MED ORDER — INSULIN ASPART 100 UNIT/ML ~~LOC~~ SOLN: 0.0000 [IU] | Freq: Every day | SUBCUTANEOUS | Status: DC

## 2018-03-01 MED ORDER — POTASSIUM CHLORIDE CRYS ER 20 MEQ PO TBCR: 20.0000 meq | EXTENDED_RELEASE_TABLET | Freq: Once | ORAL | Status: DC

## 2018-03-01 MED ORDER — ACETAMINOPHEN 500 MG PO TABS
1000.0000 mg | ORAL_TABLET | Freq: Once | ORAL | Status: AC
  Administered 2018-03-01: 1000 mg via ORAL
  Filled 2018-03-01: qty 2

## 2018-03-01 MED ORDER — POLYETHYLENE GLYCOL 3350 17 G PO PACK: 17.0000 g | PACK | Freq: Every day | ORAL | Status: DC | PRN

## 2018-03-01 MED ORDER — SODIUM CHLORIDE 0.9% FLUSH: 3.0000 mL | INTRAVENOUS | Status: DC | PRN

## 2018-03-01 MED ORDER — ONDANSETRON HCL 4 MG/2ML IJ SOLN: 4.0000 mg | Freq: Four times a day (QID) | INTRAMUSCULAR | Status: DC | PRN

## 2018-03-01 MED ORDER — ACETAMINOPHEN 650 MG RE SUPP: 650.0000 mg | Freq: Four times a day (QID) | RECTAL | Status: DC | PRN

## 2018-03-01 MED ORDER — TRAMADOL HCL 50 MG PO TABS
50.0000 mg | ORAL_TABLET | Freq: Four times a day (QID) | ORAL | Status: DC | PRN
  Administered 2018-03-01: 50 mg via ORAL
  Filled 2018-03-01: qty 1

## 2018-03-01 MED ORDER — SODIUM CHLORIDE 0.9 % IV SOLN: 250.0000 mL | INTRAVENOUS | Status: DC | PRN

## 2018-03-01 MED ORDER — INSULIN ASPART 100 UNIT/ML ~~LOC~~ SOLN: 0.0000 [IU] | Freq: Three times a day (TID) | SUBCUTANEOUS | Status: DC

## 2018-03-01 MED ORDER — FAMOTIDINE 20 MG PO TABS
20.0000 mg | ORAL_TABLET | Freq: Every day | ORAL | Status: DC
  Administered 2018-03-01: 20 mg via ORAL
  Filled 2018-03-01: qty 1

## 2018-03-01 MED ORDER — KETOROLAC TROMETHAMINE 30 MG/ML IJ SOLN
30.0000 mg | Freq: Once | INTRAMUSCULAR | Status: AC
  Administered 2018-03-01: 30 mg via INTRAVENOUS
  Filled 2018-03-01: qty 1

## 2018-03-01 MED ORDER — ACETAMINOPHEN 325 MG PO TABS: 650.0000 mg | ORAL_TABLET | Freq: Four times a day (QID) | ORAL | Status: DC | PRN

## 2018-03-01 NOTE — ED Provider Notes (Signed)
Patient still having episodes of bradycardia despite being monitored for over 6 hours HR drops into 30s He denied dizziness.  However it is likely due to clonidine as well as metoprolol use.  He will be admitted because he is still not medically cleared.  Discussed Dr. Antionette Charpyd for admission  CRITICAL CARE Performed by: Joya Gaskinsonald W Maeson Purohit Total critical care time: 35 minutes Critical care time was exclusive of separately billable procedures and treating other patients. Critical care was necessary to treat or prevent imminent or life-threatening deterioration. Critical care was time spent personally by me on the following activities: development of treatment plan with patient and/or surrogate as well as nursing, discussions with consultants, evaluation of patient's response to treatment, examination of patient, obtaining history from patient or surrogate, ordering and performing treatments and interventions, ordering and review of laboratory studies, ordering and review of radiographic studies, pulse oximetry and re-evaluation of patient's condition.    [name], Travis Galvao, MD 03/01/18 [phone]

## 2018-03-01 NOTE — ED Notes (Signed)
Pt ambulated in hallway with steady gait. Denies dizziness

## 2018-03-01 NOTE — ED Provider Notes (Signed)
EKG Interpretation  Date/Time:  Sunday March 01 2018 02:46:42 EST Ventricular Rate:  40 PR Interval:    QRS Duration: 103 QT Interval:  495 QTC Calculation: 404 R Axis:   43 Text Interpretation:  Sinus bradycardia Abnormal ekg Confirmed by [name], Doak Mah ([id number]) on 03/01/2018 2:58:56 AM       Patient resting comfortably with no acute issues, but is still bradycardic.  EKG has been reported.  Discussed with poison center, they advised to continue monitoring , no medications recommended at this time.   [name], Breckon Reeves, MD 03/01/18 [phone]

## 2018-03-01 NOTE — ED Provider Notes (Signed)
I assumed care in signout to follow-up on patient.  Plan is to monitor until approximately 4 AM per poison center recommendations.  Patient is awake alert and in no acute distress.  He denies SI.  He reports he does not want to die.  He reports he was specifically trying to upset his mother. Mildly bradycardic, but no other acute findings at this time.  He is requesting tramadol for his chronic back pain.  I advised him that no sedating medicines will be given.  He did specifically request Toradol.   [name], Nyana Haren, MD 03/01/18 [phone]

## 2018-03-01 NOTE — H&P (Signed)
History and Physical    Travis [name] [id number] DOB: [birth year] DOA: 02/28/2018  PCP: Lavinia SharpsPlacey, Mary Ann, NP   Patient coming from: Home   Chief Complaint: clonidine overdose   HPI: Travis LimboJames Schleich is a 52 y.o. male with medical history significant for hypertension and chronic back pain, now presenting to the emergency department after a clonidine overdose.  Patient reports that he was upset with his mother and took excessive dose of his clonidine in order to upset her.  He had reported to the PD that he had taken 10 tablets of 0.3 mg clonidine, but now says he took 4 tablets.  He denies any intent to harm himself, is adamant that he would not want to hurt himself, denies any desire to harm anyone else, and denies any hallucinations.  He expresses regret for taking the extra doses.  He denies any recent illness, fevers, chills, headache, chest pain, or lightheadedness.  Denies taking any other medications, illicit substances, or alcohol tonight.  ED Course: Upon arrival to the ED, patient is found to be afebrile, saturating well on room air, bradycardic in the upper 30s, and modestly hypertensive.  EKG features a sinus bradycardia with rate 38.  Chemistry panel is notable for glucose of 238 and potassium 3.4.  Platelets are clumped on CBC which is otherwise normal.  Acetaminophen and salicylate levels are undetectably low.  Urinalysis notable for glucosuria only and UDS positive for benzodiazepines.  Poison control recommended cardiac monitoring and supportive care.  Patient was given a liter of normal saline, Toradol, and acetaminophen in the ED.  He continues to be bradycardic with rates in the upper 30s though mentating appropriately and without any hypotension.  He will be observed for ongoing monitoring and management of clonidine overdose with persistent sinus bradycardia in the 30s.  Review of Systems:  All other systems reviewed and apart from HPI, are negative.  Past Medical History:    Diagnosis Date  . Anxiety    Panic attacks  . Chest pain    Hospital, March, 2014, negative enzymes, patient refused in-hospital  stress test, patient canceled outpatient stress test  . Constipation   . Degenerative disk disease   . Degenerative disk disease   . Depression   . GERD (gastroesophageal reflux disease)   . Hypertension   . Incontinence of urine   . Neuromuscular disorder (HCC)   . Obesity   . Polysubstance abuse (HCC)   . PTSD (post-traumatic stress disorder)   . Seizures (HCC)   . Shortness of breath dyspnea    with exertion  . Spinal stenosis   . Spinal stenosis   . Suicide attempt (HCC)   . Tachycardia - pulse   . Tobacco abuse     Past Surgical History:  Procedure Laterality Date  . COLONOSCOPY N/A 08/24/2014   Procedure: COLONOSCOPY;  Surgeon: Charlott RakesVincent Schooler, MD;  [facility];  Service: Endoscopy;  Laterality: N/A;  . ESOPHAGOGASTRODUODENOSCOPY (EGD) WITH PROPOFOL N/A 08/24/2014   Procedure: ESOPHAGOGASTRODUODENOSCOPY (EGD) WITH PROPOFOL;  Surgeon: Charlott RakesVincent Schooler, MD;  [facility];  Service: Endoscopy;  Laterality: N/A;  . WISDOM TOOTH EXTRACTION       reports that he quit smoking about 14 months ago. His smoking use included cigarettes. He has a 0.54 pack-year smoking history. He has never used smokeless tobacco. He reports that he does not drink alcohol or use drugs.  Allergies  Allergen Reactions  . Blueberry Flavor Hives  . Penicillins Hives, Itching and Other (See Comments)  Hallucinations. Has patient had a PCN reaction causing immediate rash, facial/tongue/throat swelling, SOB or lightheadedness with hypotension:YES Has patient had a PCN reaction causing severe rash involving mucus membranes or skin necrosis: NO Has patient had a PCN reaction that required hospitalization: yes Has patient had a PCN reaction occurring within the last 10 years: NO If all of the above answers are "NO", then may proceed with Cephalosporin  use.  . Robaxin [Methocarbamol] Palpitations  . Toradol [Ketorolac Tromethamine] Hives and Other (See Comments)    Headache    Family History  Problem Relation Age of Onset  . Stroke Mother   . Hypertension Mother   . Hyperlipidemia Mother   . Stroke Father   . Hypertension Father   . Dementia Father   . Diabetes Father   . Heart disease Father   . Hyperlipidemia Father   . Cancer Sister        brain  . Diabetes Sister   . Heart disease Sister   . Hyperlipidemia Sister   . Hypertension Sister   . Stroke Sister   . Heart disease Brother   . Hyperlipidemia Brother      Prior to Admission medications   Medication Sig Start Date End Date Taking? Authorizing Provider  cloNIDine (CATAPRES) 0.3 MG tablet Take 1 tablet (0.3 mg total) by mouth 2 (two) times daily. 02/23/18  Yes Hagler, Arlys John, MD  famotidine (PEPCID) 20 MG tablet Take 20 mg by mouth daily.   Yes [provider]  metoprolol tartrate (LOPRESSOR) 25 MG tablet Take 1 tablet (25 mg total) by mouth 2 (two) times daily. 02/23/18  Yes Mardella Layman, MD  traMADol (ULTRAM) 50 MG tablet Take 1 tablet (50 mg total) by mouth every 6 (six) hours as needed for severe pain. 02/23/18  Yes Mardella Layman, MD  metoprolol tartrate (LOPRESSOR) 50 MG tablet Take 0.5 tablets (25 mg total) by mouth 2 (two) times daily. 11/29/17 12/29/17  Dartha Lodge, PA-C    Physical Exam: Vitals:   03/01/18 0325 03/01/18 0330 03/01/18 0400 03/01/18 0430  BP: (!) 172/93 (!) 168/93 (!) 137/92 (!) 160/94  Pulse: (!) 37  (!) 48 (!) 15  Resp: 20 20 (!) 24 17  Temp:      TempSrc:      SpO2: 98%  97% 97%  Weight:      Height:        Constitutional: NAD, calm  Eyes: PERTLA, lids and conjunctivae normal ENMT: Mucous membranes are moist. Posterior pharynx clear of any exudate or lesions.   Neck: normal, supple, no masses, no thyromegaly Respiratory: clear to auscultation bilaterally, no wheezing, no crackles. Normal respiratory effort.     Cardiovascular: S1 & S2 heard, regular rate and rhythm. No extremity edema.  Abdomen: No distension, no tenderness, soft. Bowel sounds active.  Musculoskeletal: no clubbing / cyanosis. No joint deformity upper and lower extremities.   Skin: no significant rashes, lesions, ulcers. Warm, dry, well-perfused. Neurologic: CN 2-12 grossly intact. Sensation intact. Strength 5/5 in all 4 limbs.  Psychiatric: Alert and oriented x 3. Calm, cooperative.    Labs on Admission: I have personally reviewed following labs and imaging studies  CBC: Recent Labs  Lab 02/28/18 2227  WBC 9.1  NEUTROABS 5.6  HGB 13.6  HCT 42.7  MCV 90.1  PLT PLATELET CLUMPS NOTED ON SMEAR, COUNT APPEARS ADEQUATE   Basic Metabolic Panel: Recent Labs  Lab 02/28/18 2227  NA 137  K 3.4*  CL 105  CO2 26  GLUCOSE 238*  BUN <5*  CREATININE 0.78  CALCIUM 8.4*   GFR: Estimated Creatinine Clearance: 141.4 mL/min (by C-G formula based on SCr of 0.78 mg/dL). Liver Function Tests: Recent Labs  Lab 02/28/18 2227  AST 27  ALT 19  ALKPHOS 109  BILITOT 0.3  PROT 6.9  ALBUMIN 3.0*   Recent Labs  Lab 02/28/18 2227  LIPASE 30   No results for input(s): AMMONIA in the last 168 hours. Coagulation Profile: No results for input(s): INR, PROTIME in the last 168 hours. Cardiac Enzymes: No results for input(s): CKTOTAL, CKMB, CKMBINDEX, TROPONINI in the last 168 hours. BNP (last 3 results) No results for input(s): PROBNP in the last 8760 hours. HbA1C: No results for input(s): HGBA1C in the last 72 hours. CBG: No results for input(s): GLUCAP in the last 168 hours. Lipid Profile: No results for input(s): CHOL, HDL, LDLCALC, TRIG, CHOLHDL, LDLDIRECT in the last 72 hours. Thyroid Function Tests: No results for input(s): TSH, T4TOTAL, FREET4, T3FREE, THYROIDAB in the last 72 hours. Anemia Panel: No results for input(s): VITAMINB12, FOLATE, FERRITIN, TIBC, IRON, RETICCTPCT in the last 72 hours. Urine analysis:     Component Value Date/Time   COLORURINE STRAW (A) 02/28/2018 2245   APPEARANCEUR CLEAR 02/28/2018 2245   LABSPEC 1.005 02/28/2018 2245   PHURINE 7.0 02/28/2018 2245   GLUCOSEU >=500 (A) 02/28/2018 2245   GLUCOSEU NEG mg/dL 14/78/295609/23/2009 21300047   HGBUR NEGATIVE 02/28/2018 2245   BILIRUBINUR NEGATIVE 02/28/2018 2245   BILIRUBINUR SMALL 08/16/2013 1351   KETONESUR NEGATIVE 02/28/2018 2245   PROTEINUR NEGATIVE 02/28/2018 2245   UROBILINOGEN 0.2 04/05/2014 0127   NITRITE NEGATIVE 02/28/2018 2245   LEUKOCYTESUR NEGATIVE 02/28/2018 2245   Sepsis Labs: @LABRCNTIP (procalcitonin:4,lacticidven:4) )No results found for this or any previous visit (from the past 240 hour(s)).   Radiological Exams on Admission: No results found.  EKG: Independently reviewed. Sinus bradycardia (rate 38).   Assessment/Plan   1. Sinus bradycardia  - HR persisting in upper 30's-40's; he has remained asymptomatic  - Continue cardiac monitoring, hold clonidine and Lopressor, continue supportive care, use atropine if symptoms develop    2. Clonidine overdose  - Reported to PD that he took ten 0.3 mg tabs, now states it was four tablets  - He adamantly denies any intention of harming himself and regrets doing this  - Continue cardiac monitoring, hold antihypertensives, continue supportive care   3. Hypertension  - BP modestly elevated in ED  - HTN is reportedly common early in clonidine o/d with hypotension often developing later, so will hold treatment unless concern for impending hypertensive crisis   4. Hyperglycemia  - No recent A1c, will check  - Monitor, use SSI with Novolog as-needed   5. Hypokalemia  - Mild, replaced, continue cardiac monitoring     DVT prophylaxis: SCD's  Code Status: Full  Family Communication: Discussed with patient  Consults called: Poison Control  Admission status: Observation     Briscoe Deutscherimothy S Anyiah Coverdale, MD [facility] Pager [phone]  If 7PM-7AM, please contact  night-coverage [email or web address] Password [email or web address]  03/01/2018, 5:18 AM

## 2018-03-01 NOTE — Discharge Summary (Signed)
Physician Discharge Summary  Travis LimboJames Woldt [id number] DOB: June 23, 1965 DOA: 02/28/2018  PCP: Lavinia SharpsPlacey, Mary Ann, NP  Admit date: 02/28/2018 Discharge date: 03/01/2018  Time spent: 10 minutes  Patient left AMA patient was admitted for accidental overdose of his blood pressure medications and for bradycardia that was asymptomatic.  Patient denies this was a suicidal attempt denies wanting to hurt himself.  Patient is still bradycardic in the 40s.  Patient is leaving AMA after his mom has fell when she needs to go home and take care of her.  Discharge Diagnoses:  Principal Problem:   Bradycardia, drug induced Active Problems:   Essential hypertension   Hyperglycemia   Clonidine overdose    Filed Weights   02/28/18 2108 03/01/18 0622  Weight: 113.4 kg 121.2 kg    Discharge Exam: Vitals:   03/01/18 0543 03/01/18 0622  BP: (!) 165/95 (!) 159/97  Pulse: (!) 39 (!) 37  Resp: 15 20  Temp: 97.8 F (36.6 C) 97.9 F (36.6 C)  SpO2: 99% 100%     Discharge Instructions     Allergies  Allergen Reactions  . Blueberry Flavor Hives  . Penicillins Hives, Itching and Other (See Comments)    Hallucinations. Has patient had a PCN reaction causing immediate rash, facial/tongue/throat swelling, SOB or lightheadedness with hypotension:YES Has patient had a PCN reaction causing severe rash involving mucus membranes or skin necrosis: NO Has patient had a PCN reaction that required hospitalization: yes Has patient had a PCN reaction occurring within the last 10 years: NO If all of the above answers are "NO", then may proceed with Cephalosporin use.  . Robaxin [Methocarbamol] Palpitations  . Toradol [Ketorolac Tromethamine] Hives and Other (See Comments)    Headache      The results of significant diagnostics from this hospitalization (including imaging, microbiology, ancillary and laboratory) are listed below for reference.    Significant Diagnostic Studies: Dg Ribs Unilateral  W/chest Left  Result Date: 01/30/2018 CLINICAL DATA:  Recent diagnosis of probable left seventh rib fracture. Fall last night with increased lateral rib pain and pain with breathing. EXAM: LEFT RIBS AND CHEST - 3+ VIEW COMPARISON:  None. FINDINGS: The heart size and mediastinal contours are within normal limits. There is mild bibasilar atelectasis. There is no evidence of pulmonary edema, consolidation, pneumothorax, nodule or pleural fluid. Left rib films show mildly displaced and angulated fractures involving anterolateral aspects of the left sixth and seventh ribs. There may be a subtle nondisplaced fracture of the left eighth rib. IMPRESSION: Mildly displaced and angulated fractures of the left sixth and seventh ribs. Possible subtle nondisplaced fracture of the left eighth rib. No associated pneumothorax or pleural fluid. Electronically Signed   By: Irish LackGlenn  Yamagata M.D.   On: 01/30/2018 11:21    Microbiology: No results found for this or any previous visit (from the past 240 hour(s)).   Labs: Basic Metabolic Panel: Recent Labs  Lab 02/28/18 2227  NA 137  K 3.4*  CL 105  CO2 26  GLUCOSE 238*  BUN <5*  CREATININE 0.78  CALCIUM 8.4*   Liver Function Tests: Recent Labs  Lab 02/28/18 2227  AST 27  ALT 19  ALKPHOS 109  BILITOT 0.3  PROT 6.9  ALBUMIN 3.0*   Recent Labs  Lab 02/28/18 2227  LIPASE 30   No results for input(s): AMMONIA in the last 168 hours. CBC: Recent Labs  Lab 02/28/18 2227  WBC 9.1  NEUTROABS 5.6  HGB 13.6  HCT 42.7  MCV 90.1  PLT PLATELET CLUMPS NOTED ON SMEAR, COUNT APPEARS ADEQUATE   Cardiac Enzymes: No results for input(s): CKTOTAL, CKMB, CKMBINDEX, TROPONINI in the last 168 hours. BNP: BNP (last 3 results) Recent Labs    06/11/17 0113  BNP 29.1    ProBNP (last 3 results) No results for input(s): PROBNP in the last 8760 hours.  CBG: Recent Labs  Lab 03/01/18 0925  GLUCAP 133*       Signed:  Lovette Merta A MD.  [facility] 03/01/2018, 10:12 AM

## 2018-03-02 LAB — HIV ANTIBODY (ROUTINE TESTING W REFLEX): HIV Screen 4th Generation wRfx: NONREACTIVE

## 2018-03-05 ENCOUNTER — Other Ambulatory Visit: Payer: Self-pay

## 2018-03-05 ENCOUNTER — Encounter (HOSPITAL_COMMUNITY): Payer: Self-pay

## 2018-03-05 ENCOUNTER — Emergency Department (HOSPITAL_COMMUNITY)
Admission: EM | Admit: 2018-03-05 | Discharge: 2018-03-05 | Disposition: A | Discharge status: Home / Self Care | Payer: Self-pay | Attending: Emergency Medicine | Admitting: Emergency Medicine

## 2018-03-05 DIAGNOSIS — I10 Essential (primary) hypertension: Secondary | ICD-10-CM | POA: Insufficient documentation

## 2018-03-05 DIAGNOSIS — Y69 Unspecified misadventure during surgical and medical care: Secondary | ICD-10-CM | POA: Insufficient documentation

## 2018-03-05 DIAGNOSIS — Z87891 Personal history of nicotine dependence: Secondary | ICD-10-CM | POA: Insufficient documentation

## 2018-03-05 DIAGNOSIS — T887XXA Unspecified adverse effect of drug or medicament, initial encounter: Secondary | ICD-10-CM | POA: Insufficient documentation

## 2018-03-05 DIAGNOSIS — T50901A Poisoning by unspecified drugs, medicaments and biological substances, accidental (unintentional), initial encounter: Secondary | ICD-10-CM | POA: Insufficient documentation

## 2018-03-05 LAB — COMPREHENSIVE METABOLIC PANEL
ALT: 21 U/L (ref 0–44)
AST: 36 U/L (ref 15–41)
Albumin: 3.5 g/dL (ref 3.5–5.0)
Alkaline Phosphatase: 114 U/L (ref 38–126)
Anion gap: 4 — ABNORMAL LOW (ref 5–15)
BILIRUBIN TOTAL: 0.6 mg/dL (ref 0.3–1.2)
BUN: 7 mg/dL (ref 6–20)
CO2: 30 mmol/L (ref 22–32)
Calcium: 8.6 mg/dL — ABNORMAL LOW (ref 8.9–10.3)
Chloride: 101 mmol/L (ref 98–111)
Creatinine, Ser: 1.06 mg/dL (ref 0.61–1.24)
GFR calc Af Amer: >60 mL/min (ref >60)
GFR calc non Af Amer: >60 mL/min (ref >60)
GLUCOSE: 176 mg/dL — AB (ref 70–99)
Potassium: 4.7 mmol/L (ref 3.5–5.1)
Sodium: 135 mmol/L (ref 135–145)
Total Protein: 7.5 g/dL (ref 6.5–8.1)

## 2018-03-05 LAB — CBC
HCT: 44.5 % (ref 39.0–52.0)
Hemoglobin: 14.2 g/dL (ref 13.0–17.0)
MCH: 29.2 pg (ref 26.0–34.0)
MCHC: 31.9 g/dL (ref 30.0–36.0)
MCV: 91.6 fL (ref 80.0–100.0)
Platelets: 177 10*3/uL (ref 150–400)
RBC: 4.86 MIL/uL (ref 4.22–5.81)
RDW: 13 % (ref 11.5–15.5)
WBC: 10.9 10*3/uL — ABNORMAL HIGH (ref 4.0–10.5)
nRBC: 0 % (ref 0.0–0.2)

## 2018-03-05 LAB — RAPID URINE DRUG SCREEN, HOSP PERFORMED
Amphetamines: NOT DETECTED
Barbiturates: NOT DETECTED
Benzodiazepines: POSITIVE — AB
Cocaine: NOT DETECTED
Opiates: NOT DETECTED
Tetrahydrocannabinol: NOT DETECTED

## 2018-03-05 LAB — URINALYSIS, ROUTINE W REFLEX MICROSCOPIC
Bilirubin Urine: NEGATIVE
Glucose, UA: NEGATIVE mg/dL
Hgb urine dipstick: NEGATIVE
Ketones, ur: NEGATIVE mg/dL
Leukocytes, UA: NEGATIVE
Nitrite: NEGATIVE
PROTEIN: NEGATIVE mg/dL
Specific Gravity, Urine: 1.001 — ABNORMAL LOW (ref 1.005–1.030)
pH: 6 (ref 5.0–8.0)

## 2018-03-05 MED ORDER — METOPROLOL TARTRATE 25 MG PO TABS
25.0000 mg | ORAL_TABLET | Freq: Once | ORAL | Status: AC
  Administered 2018-03-05: 25 mg via ORAL
  Filled 2018-03-05: qty 1

## 2018-03-05 MED ORDER — TRAMADOL HCL 50 MG PO TABS
50.0000 mg | ORAL_TABLET | Freq: Once | ORAL | Status: AC
  Administered 2018-03-05: 50 mg via ORAL
  Filled 2018-03-05: qty 1

## 2018-03-05 MED ORDER — CLONIDINE HCL 0.2 MG PO TABS
0.3000 mg | ORAL_TABLET | Freq: Once | ORAL | Status: AC
  Administered 2018-03-05: 0.3 mg via ORAL
  Filled 2018-03-05: qty 1

## 2018-03-05 NOTE — ED Provider Notes (Addendum)
[facility] Emergency Department Provider Note MRN:  [id number]  Arrival date & time: 03/05/18     Chief Complaint   Drug Overdose   History of Present Illness   Travis [name] is a 53 y.o. year-old male with a history of seizure disorder, polysubstance abuse, spinal stenosis presenting to the ED with chief complaint of drug overdose.  Patient explains that he has chronic pain from broken ribs that occurred years ago.  Took a few extra oxycodones today for the pain.  Was found to be very lethargic earlier today, EMS was called, gave Narcan and improved.  Patient denies that this was a drug overdose, explains that he had a seizure earlier today.  Has a known seizure disorder.  Denies any head trauma, no recent fever cold-like symptoms, no chest pain or shortness of breath, no abdominal pain.  Currently without complaints.  Review of Systems  A complete 10 system review of systems was obtained and all systems are negative except as noted in the HPI and PMH.   Patient's Health History    Past Medical History:  Diagnosis Date  . Anxiety    Panic attacks  . Chest pain    Hospital, March, 2014, negative enzymes, patient refused in-hospital  stress test, patient canceled outpatient stress test  . Constipation   . Degenerative disk disease   . Degenerative disk disease   . Depression   . GERD (gastroesophageal reflux disease)   . Hypertension   . Incontinence of urine   . Neuromuscular disorder (HCC)   . Obesity   . Polysubstance abuse (HCC)   . PTSD (post-traumatic stress disorder)   . Seizures (HCC)   . Shortness of breath dyspnea    with exertion  . Spinal stenosis   . Spinal stenosis   . Suicide attempt (HCC)   . Tachycardia - pulse   . Tobacco abuse     Past Surgical History:  Procedure Laterality Date  . COLONOSCOPY N/A 08/24/2014   Procedure: COLONOSCOPY;  Surgeon: Charlott Rakes, MD;  Location: [facility] ENDOSCOPY;  Service: Endoscopy;  Laterality: N/A;  .  ESOPHAGOGASTRODUODENOSCOPY (EGD) WITH PROPOFOL N/A 08/24/2014   Procedure: ESOPHAGOGASTRODUODENOSCOPY (EGD) WITH PROPOFOL;  Surgeon: Charlott Rakes, MD;  Location: [facility] ENDOSCOPY;  Service: Endoscopy;  Laterality: N/A;  . WISDOM TOOTH EXTRACTION      Family History  Problem Relation Age of Onset  . Stroke Mother   . Hypertension Mother   . Hyperlipidemia Mother   . Stroke Father   . Hypertension Father   . Dementia Father   . Diabetes Father   . Heart disease Father   . Hyperlipidemia Father   . Cancer Sister        brain  . Diabetes Sister   . Heart disease Sister   . Hyperlipidemia Sister   . Hypertension Sister   . Stroke Sister   . Heart disease Brother   . Hyperlipidemia Brother     Social History   Socioeconomic History  . Marital status: Divorced    Spouse name: Not on file  . Number of children: Not on file  . Years of education: Not on file  . Highest education level: Not on file  Occupational History  . Not on file  Social Needs  . Financial resource strain: Not on file  . Food insecurity:    Worry: Not on file    Inability: Not on file  . Transportation needs:    Medical: Not on file  Non-medical: Not on file  Tobacco Use  . Smoking status: Former Smoker    Packs/day: 0.02    Years: 27.00    Pack years: 0.54    Types: Cigarettes    Last attempt to quit: 12/12/2016    Years since quitting: 1.2  . Smokeless tobacco: Never Used  . Tobacco comment: "quit April 2016, I havwe one every now and then"  Substance and Sexual Activity  . Alcohol use: No  . Drug use: No  . Sexual activity: Never  Lifestyle  . Physical activity:    Days per week: Not on file    Minutes per session: Not on file  . Stress: Not on file  Relationships  . Social connections:    Talks on phone: Not on file    Gets together: Not on file    Attends religious service: Not on file    Active member of club or organization: Not on file    Attends meetings of clubs or  organizations: Not on file    Relationship status: Not on file  . Intimate partner violence:    Fear of current or ex partner: Not on file    Emotionally abused: Not on file    Physically abused: Not on file    Forced sexual activity: Not on file  Other Topics Concern  . Not on file  Social History Narrative  . Not on file     Physical Exam  Vital Signs and Nursing Notes reviewed Vitals:   03/05/18 1737 03/05/18 1939  BP: 109/75   Pulse: 98 80  Resp: 18   Temp: 98.6 F (37 C)   SpO2: 95% 98%    CONSTITUTIONAL: Chronically ill-appearing, NAD NEURO:  Alert and oriented x 3, no focal deficits EYES:  eyes equal and reactive ENT/NECK:  no LAD, no JVD CARDIO: Regular rate, well-perfused, normal S1 and S2 PULM:  CTAB no wheezing or rhonchi GI/GU:  normal bowel sounds, non-distended, non-tender MSK/SPINE:  No gross deformities, no edema SKIN:  no rash, atraumatic PSYCH:  Appropriate speech and behavior  Diagnostic and Interventional Summary    Labs Reviewed  CBC - Abnormal; Notable for the following components:      Result Value   WBC 10.9 (*)    All other components within normal limits  COMPREHENSIVE METABOLIC PANEL - Abnormal; Notable for the following components:   Glucose, Bld 176 (*)    Calcium 8.6 (*)    Anion gap 4 (*)    All other components within normal limits  URINALYSIS, ROUTINE W REFLEX MICROSCOPIC - Abnormal; Notable for the following components:   Color, Urine COLORLESS (*)    Specific Gravity, Urine 1.001 (*)    All other components within normal limits  RAPID URINE DRUG SCREEN, HOSP PERFORMED - Abnormal; Notable for the following components:   Benzodiazepines POSITIVE (*)    All other components within normal limits    No orders to display    Medications  cloNIDine (CATAPRES) tablet 0.3 mg (has no administration in time range)  metoprolol tartrate (LOPRESSOR) tablet 25 mg (has no administration in time range)  traMADol (ULTRAM) tablet 50 mg (has  no administration in time range)     Procedures Critical Care  ED Course and Medical Decision Making  I have reviewed the triage vital signs and the nursing notes.  Pertinent labs & imaging results that were available during my care of the patient were reviewed by me and considered in my medical decision making (  see below for details).  Suspect opioid overdose in this 53 year old male with history of polysubstance abuse.  Currently non-sedate, pupils normal size, no other toxidrome features, no complaints currently.  Normal neurological exam, no evidence of trauma.  Given the patient's endorsement of a seizure earlier today, will obtain screening labs and urinalysis, given the concern for opioid overdose and possible re-sedation, will observe in the ED for a period of time.  Labs and urinalysis unremarkable.  Patient was evaluated in the ED and observed for 4 hours with IV sedation, no return of any symptoms.  We will follow-up with PCP tomorrow.  Again patient denies any intentional self-harm, not suicidal, not homicidal, no AVH.  After the discussed management above, the patient was determined to be safe for discharge.  The patient was in agreement with this plan and all questions regarding their care were answered.  ED return precautions were discussed and the patient will return to the ED with any significant worsening of condition.  Elmer SowMichael M. Pilar PlateBero, MD [facility] [email or web address]  Final Clinical Impressions(s) / ED Diagnoses     ICD-10-CM   1. Accidental drug overdose, initial encounter T50.901A     ED Discharge Orders    None         Sabas SousBero, Sanaya Gwilliam M, MD 03/05/18 2140    Sabas SousBero, Carissa Musick M, MD 03/05/18 2140

## 2018-03-05 NOTE — ED Triage Notes (Signed)
Pt normally takes Tramadol for pain. Took Clonidine this am and also took oxycodone 5mg  (2 tabs). EMS was called and states was very lethargic and was given narcan. Upon arrival pt is A&O.

## 2018-03-05 NOTE — Discharge Instructions (Addendum)
You were evaluated in the Emergency Department and after careful evaluation, we did not find any emergent condition requiring admission or further testing in the hospital. ° °Please return to the Emergency Department if you experience any worsening of your condition.  We encourage you to follow up with a primary care provider.  Thank you for allowing us to be a part of your care. °

## 2018-03-08 ENCOUNTER — Ambulatory Visit (HOSPITAL_COMMUNITY)
Admission: EM | Admit: 2018-03-08 | Discharge: 2018-03-08 | Disposition: A | Discharge status: Home / Self Care | Payer: Self-pay | Attending: Urgent Care | Admitting: Urgent Care

## 2018-03-08 ENCOUNTER — Encounter (HOSPITAL_COMMUNITY): Payer: Self-pay

## 2018-03-08 DIAGNOSIS — I10 Essential (primary) hypertension: Secondary | ICD-10-CM

## 2018-03-08 DIAGNOSIS — G40909 Epilepsy, unspecified, not intractable, without status epilepticus: Secondary | ICD-10-CM | POA: Insufficient documentation

## 2018-03-08 DIAGNOSIS — R030 Elevated blood-pressure reading, without diagnosis of hypertension: Secondary | ICD-10-CM | POA: Insufficient documentation

## 2018-03-08 DIAGNOSIS — Z760 Encounter for issue of repeat prescription: Secondary | ICD-10-CM | POA: Insufficient documentation

## 2018-03-08 MED ORDER — CLONIDINE HCL 0.3 MG PO TABS: 0.3000 mg | ORAL_TABLET | Freq: Two times a day (BID) | ORAL | 0 refills | Status: DC

## 2018-03-08 MED ORDER — TRAMADOL HCL 50 MG PO TABS: 50.0000 mg | ORAL_TABLET | Freq: Three times a day (TID) | ORAL | 0 refills | Status: DC | PRN

## 2018-03-08 NOTE — Discharge Instructions (Addendum)
Please report to the [facility] for management of your blood pressure, seizure disorder.   [facility] [address] [place], [place]  [phone] Closed  Opens tomorrow 8:30 AM

## 2018-03-08 NOTE — ED Provider Notes (Addendum)
MRN: [id number] DOB: 1965/10/21  Subjective:   Travis [name] is a 53 y.o. male presenting for medication refill of his clonidine.  Reports that he had a seizure 2 days ago because he did not have his clonidine.  He did go to the ER and reports that they gave him medication that has caused significant pain throughout his entire body.  Responds well to tramadol and is requesting a refill today.  He has previously gotten at least 3 other refills from this clinic. Denies dizziness, chronic headache, blurred vision, chest pain, shortness of breath, heart racing, palpitations, nausea, vomiting, abdominal pain, hematuria, lower leg swelling.  He plans on establishing care now that he has Medicaid.  He does not have any seizure medication currently.  No current facility-administered medications for this encounter.   Current Outpatient Medications:  .  cloNIDine (CATAPRES) 0.3 MG tablet, Take 1 tablet (0.3 mg total) by mouth 2 (two) times daily., Disp: 60 tablet, Rfl: 1 .  famotidine (PEPCID) 20 MG tablet, Take 20 mg by mouth daily., Disp: , Rfl:  .  metoprolol tartrate (LOPRESSOR) 25 MG tablet, Take 1 tablet (25 mg total) by mouth 2 (two) times daily., Disp: 30 tablet, Rfl: 1 .  metoprolol tartrate (LOPRESSOR) 50 MG tablet, Take 0.5 tablets (25 mg total) by mouth 2 (two) times daily., Disp: 30 tablet, Rfl: 0 .  traMADol (ULTRAM) 50 MG tablet, Take 1 tablet (50 mg total) by mouth every 6 (six) hours as needed for severe pain., Disp: 20 tablet, Rfl: 0   Allergies  Allergen Reactions  . Blueberry Flavor Hives  . Penicillins Hives, Itching and Other (See Comments)    Hallucinations. Has patient had a PCN reaction causing immediate rash, facial/tongue/throat swelling, SOB or lightheadedness with hypotension:YES Has patient had a PCN reaction causing severe rash involving mucus membranes or skin necrosis: NO Has patient had a PCN reaction that required hospitalization: yes Has patient had a PCN reaction  occurring within the last 10 years: NO If all of the above answers are "NO", then may proceed with Cephalosporin use.  . Robaxin [Methocarbamol] Palpitations  . Toradol [Ketorolac Tromethamine] Hives and Other (See Comments)    Headache    Past Medical History:  Diagnosis Date  . Anxiety    Panic attacks  . Chest pain    Hospital, March, 2014, negative enzymes, patient refused in-hospital  stress test, patient canceled outpatient stress test  . Constipation   . Degenerative disk disease   . Degenerative disk disease   . Depression   . GERD (gastroesophageal reflux disease)   . Hypertension   . Incontinence of urine   . Neuromuscular disorder (HCC)   . Obesity   . Polysubstance abuse (HCC)   . PTSD (post-traumatic stress disorder)   . Seizures (HCC)   . Shortness of breath dyspnea    with exertion  . Spinal stenosis   . Spinal stenosis   . Suicide attempt (HCC)   . Tachycardia - pulse   . Tobacco abuse      Past Surgical History:  Procedure Laterality Date  . COLONOSCOPY N/A 08/24/2014   Procedure: COLONOSCOPY;  Surgeon: Charlott Rakes, MD;  Location: [facility] ENDOSCOPY;  Service: Endoscopy;  Laterality: N/A;  . ESOPHAGOGASTRODUODENOSCOPY (EGD) WITH PROPOFOL N/A 08/24/2014   Procedure: ESOPHAGOGASTRODUODENOSCOPY (EGD) WITH PROPOFOL;  Surgeon: Charlott Rakes, MD;  Location: [facility] ENDOSCOPY;  Service: Endoscopy;  Laterality: N/A;  . WISDOM TOOTH EXTRACTION      Objective:   Vitals: BP (!) 159/120 (  BP Location: Right Arm)   Pulse 74   Temp 99.2 F (37.3 C) (Oral)   Resp 18   SpO2 100%   Physical Exam Constitutional:      General: He is not in acute distress.    Appearance: Normal appearance. He is well-developed. He is not ill-appearing, toxic-appearing or diaphoretic.  HENT:     Head: Normocephalic and atraumatic.     Right Ear: External ear normal.     Left Ear: External ear normal.     Nose: Nose normal.     Mouth/Throat:     Mouth: Mucous membranes are moist.      Pharynx: Oropharynx is clear.  Eyes:     General: No scleral icterus.    Extraocular Movements: Extraocular movements intact.     Pupils: Pupils are equal, round, and reactive to light.  Neck:     Musculoskeletal: Normal range of motion and neck supple. No neck rigidity.  Cardiovascular:     Rate and Rhythm: Normal rate and regular rhythm.     Heart sounds: Normal heart sounds. No murmur. No friction rub. No gallop.   Pulmonary:     Effort: Pulmonary effort is normal. No respiratory distress.     Breath sounds: Normal breath sounds. No stridor. No wheezing, rhonchi or rales.  Lymphadenopathy:     Cervical: No cervical adenopathy.  Skin:    General: Skin is warm and dry.  Neurological:     General: No focal deficit present.     Mental Status: He is alert and oriented to person, place, and time.     Cranial Nerves: No cranial nerve deficit.     Motor: No weakness.     Coordination: Coordination abnormal (Using a cane to ambulate).     Deep Tendon Reflexes: Reflexes normal.  Psychiatric:        Mood and Affect: Mood normal.        Behavior: Behavior normal.        Thought Content: Thought content normal.     Assessment and Plan :   Essential hypertension  Elevated blood pressure reading  Medication refill  Seizure disorder (HCC)  I refilled patient's clonidine.  Counseled extensively on need for establishing care for management of his hypertension and seizure disorder.  Also emphasized that we will no longer refill his tramadol as it is inappropriate to use a controlled substance long-term and to continue getting refills at this urgent care clinic.  He was previously taking tramadol for rib fractures as seen in November 2019 but is not endorsing this kind of pain today.  Patient verbalized understanding including that we will no longer refill his controlled substances here.  He plans on establishing care this week with primary care clinic.  Physical exam findings  reassuring, strict ER precautions reviewed.   Wallis BambergMani, Lenix Kidd, PA-C 03/08/18 1441    Wallis BambergMani, Rashod Gougeon, PA-C 03/08/18 1443

## 2018-03-08 NOTE — ED Triage Notes (Signed)
Pt presents with left leg pain.  Pt also presents for medication refill.

## 2018-03-09 NOTE — ED Provider Notes (Signed)
[facility]   [id number] 02/23/18 Arrival Time: 0930  ASSESSMENT & PLAN:  1. Left-sided chest wall pain   2. Uncontrolled hypertension    No indication for imaging of his chest at this time. No signs of lung infection. No suspicion of pneumothorax. Discussed.  Meds ordered this encounter  Medications  . cloNIDine (CATAPRES) 0.3 MG tablet    Sig: Take 1 tablet (0.3 mg total) by mouth 2 (two) times daily.    Dispense:  60 tablet    Refill:  1  . metoprolol tartrate (LOPRESSOR) 25 MG tablet    Sig: Take 1 tablet (25 mg total) by mouth 2 (two) times daily.    Dispense:  30 tablet    Refill:  1  .  traMADol (ULTRAM) 50 MG tablet    Sig: Take 1 tablet (50 mg total) by mouth every 6 (six) hours as needed for severe pain.    Dispense:  20 tablet    Refill:  0   [place] Controlled Substances Registry consulted for this patient. I feel the risk/benefit ratio today is favorable for proceeding with this prescription for a controlled substance. Medication sedation precautions given.  Follow-up Information    Schedule an appointment as soon as possible for a visit  with [name], Chales Abrahams, NP.   Contact information: [address] [name] [address] [phone]          Reviewed expectations re: course of current medical issues. Questions answered. Outlined signs and symptoms indicating need for more acute intervention. Patient verbalized understanding. After Visit Summary given.  SUBJECTIVE: History from: patient. Travis [name] is a 53 y.o. male who reports intermittent moderate pain of his left chest wall; described as aching and with sharp exacerbations, esp with coughing. Has been coughing and sneezing for a few weeks; on/off. No cold/URI symptoms. Questions relation. No CP or SOB reported. Injury/trama: no. Symptoms have waxed and waned since beginning. Ibuprofen occasionally helps if pain persists. Associated symptoms: none reported. Extremity sensation changes  or weakness: none. Pain does affect sleep. Requests something to ease pain at night.  Social History   Tobacco Use  Smoking Status Former Smoker  . Packs/day: 0.02  . Years: 27.00  . Pack years: 0.54  . Types: Cigarettes  . Last attempt to quit: 12/12/2016  . Years since quitting: 1.2  Smokeless Tobacco Never Used  Tobacco Comment   "quit April 2016, I havwe one every now and then"   Also requests refill of BP medications. Has been out.  Past Surgical History:  Procedure Laterality Date  . COLONOSCOPY N/A 08/24/2014   Procedure: COLONOSCOPY;  Surgeon: Charlott Rakes, MD;  Location: [facility] ENDOSCOPY;  Service: Endoscopy;  Laterality: N/A;  . ESOPHAGOGASTRODUODENOSCOPY (EGD) WITH PROPOFOL N/A 08/24/2014   Procedure: ESOPHAGOGASTRODUODENOSCOPY (EGD) WITH PROPOFOL;  Surgeon: Charlott Rakes, MD;  Location: [facility] ENDOSCOPY;  Service: Endoscopy;  Laterality: N/A;  . WISDOM TOOTH EXTRACTION       ROS: As per HPI. All other systems negative.    OBJECTIVE:  Vitals:   02/23/18 1025  BP: (!) 160/103  Pulse: 75  Resp: 16  Temp: 98.4 F (36.9 C)  TempSrc: Oral  SpO2: 99%  Weight: 113.4 kg    General appearance: alert; no distress HEENT: [place]; AT Neck: supple with FROM CV: RRR Lungs: CTAB; without wheezing; mild dry cough Extremities: no LE edema or swelling Skin: warm and dry; no visible rashes Neurologic: gait normal Psychological: alert and cooperative; normal mood and affect  Allergies  Allergen Reactions  . Blueberry Flavor Hives  . Penicillins Hives, Itching and Other (See Comments)    Hallucinations. Has patient had a PCN reaction causing immediate rash, facial/tongue/throat swelling, SOB or lightheadedness with hypotension:YES Has patient had a PCN reaction causing severe rash involving mucus membranes or skin necrosis: NO Has patient had a PCN reaction that required hospitalization: yes Has patient had a PCN reaction occurring within the last 10 years: NO If all of  the above answers are "NO", then may proceed with Cephalosporin use.  . Robaxin [Methocarbamol] Palpitations  . Toradol [Ketorolac Tromethamine] Hives and Other (See Comments)    Headache    Past Medical History:  Diagnosis Date  . Anxiety    Panic attacks  . Chest pain    Hospital, March, 2014, negative enzymes, patient refused in-hospital  stress test, patient canceled outpatient stress test  . Constipation   . Degenerative disk disease   . Degenerative disk disease   . Depression   . GERD (gastroesophageal reflux disease)   . Hypertension   . Incontinence of urine   . Neuromuscular disorder (HCC)   . Obesity   . Polysubstance abuse (HCC)   . PTSD (post-traumatic stress disorder)   . Seizures (HCC)   . Shortness of breath dyspnea    with exertion  . Spinal stenosis   . Spinal stenosis   . Suicide attempt (HCC)   . Tachycardia - pulse   . Tobacco abuse    Social History   Socioeconomic History  . Marital status: Divorced    Spouse name: Not on file  . Number of children: Not on file  . Years of education: Not on file  . Highest education level: Not on file  Occupational History  . Not on file  Social Needs  . Financial resource strain: Not on file  . Food insecurity:    Worry: Not on file    Inability: Not on file  . Transportation needs:    Medical: Not on file    Non-medical: Not on file  Tobacco Use  . Smoking status: Former Smoker    Packs/day: 0.02    Years: 27.00    Pack years: 0.54    Types: Cigarettes    Last attempt to quit: 12/12/2016    Years since quitting: 1.2  . Smokeless tobacco: Never Used  . Tobacco comment: "quit April 2016, I havwe one every now and then"  Substance and Sexual Activity  . Alcohol use: No  . Drug use: No  . Sexual activity: Never  Lifestyle  . Physical activity:    Days per week: Not on file    Minutes per session: Not on file  . Stress: Not on file  Relationships  . Social connections:    Talks on phone: Not  on file    Gets together: Not on file    Attends religious service: Not on file    Active member of club or organization: Not on file    Attends meetings of clubs or organizations: Not on file    Relationship status: Not on file  Other Topics Concern  . Not on file  Social History Narrative  . Not on file   Family History  Problem Relation Age of Onset  . Stroke Mother   . Hypertension Mother   . Hyperlipidemia Mother   . Stroke Father   . Hypertension Father   . Dementia Father   . Diabetes Father   . Heart disease Father   .  Hyperlipidemia Father   . Cancer Sister        brain  . Diabetes Sister   . Heart disease Sister   . Hyperlipidemia Sister   . Hypertension Sister   . Stroke Sister   . Heart disease Brother   . Hyperlipidemia Brother    Past Surgical History:  Procedure Laterality Date  . COLONOSCOPY N/A 08/24/2014   Procedure: COLONOSCOPY;  Surgeon: Charlott RakesVincent Schooler, MD;  [facility];  Service: Endoscopy;  Laterality: N/A;  . ESOPHAGOGASTRODUODENOSCOPY (EGD) WITH PROPOFOL N/A 08/24/2014   Procedure: ESOPHAGOGASTRODUODENOSCOPY (EGD) WITH PROPOFOL;  Surgeon: Charlott RakesVincent Schooler, MD;  [facility];  Service: Endoscopy;  Laterality: N/A;  . WISDOM TOOTH EXTRACTION        Mardella LaymanHagler, Flara Storti, MD 03/09/18 [phone]

## 2018-03-14 ENCOUNTER — Emergency Department (HOSPITAL_BASED_OUTPATIENT_CLINIC_OR_DEPARTMENT_OTHER): Payer: No Typology Code available for payment source

## 2018-03-14 ENCOUNTER — Encounter (HOSPITAL_BASED_OUTPATIENT_CLINIC_OR_DEPARTMENT_OTHER): Payer: Self-pay

## 2018-03-14 ENCOUNTER — Other Ambulatory Visit: Payer: Self-pay

## 2018-03-14 ENCOUNTER — Emergency Department (HOSPITAL_BASED_OUTPATIENT_CLINIC_OR_DEPARTMENT_OTHER)
Admission: EM | Admit: 2018-03-14 | Discharge: 2018-03-14 | Disposition: A | Discharge status: Home / Self Care | Payer: No Typology Code available for payment source | Attending: Emergency Medicine | Admitting: Emergency Medicine

## 2018-03-14 DIAGNOSIS — Z87891 Personal history of nicotine dependence: Secondary | ICD-10-CM | POA: Diagnosis not present

## 2018-03-14 DIAGNOSIS — Y999 Unspecified external cause status: Secondary | ICD-10-CM | POA: Insufficient documentation

## 2018-03-14 DIAGNOSIS — Y92410 Unspecified street and highway as the place of occurrence of the external cause: Secondary | ICD-10-CM | POA: Diagnosis not present

## 2018-03-14 DIAGNOSIS — Z79899 Other long term (current) drug therapy: Secondary | ICD-10-CM | POA: Diagnosis not present

## 2018-03-14 DIAGNOSIS — I10 Essential (primary) hypertension: Secondary | ICD-10-CM | POA: Diagnosis not present

## 2018-03-14 DIAGNOSIS — R569 Unspecified convulsions: Secondary | ICD-10-CM | POA: Diagnosis not present

## 2018-03-14 DIAGNOSIS — Y939 Activity, unspecified: Secondary | ICD-10-CM | POA: Insufficient documentation

## 2018-03-14 DIAGNOSIS — R0789 Other chest pain: Secondary | ICD-10-CM | POA: Diagnosis not present

## 2018-03-14 DIAGNOSIS — R079 Chest pain, unspecified: Secondary | ICD-10-CM | POA: Diagnosis present

## 2018-03-14 LAB — CBG MONITORING, ED: Glucose-Capillary: 173 mg/dL — ABNORMAL HIGH (ref 70–99)

## 2018-03-14 MED ORDER — ACETAMINOPHEN 500 MG PO TABS
1000.0000 mg | ORAL_TABLET | Freq: Once | ORAL | Status: AC
  Administered 2018-03-14: 1000 mg via ORAL
  Filled 2018-03-14: qty 2

## 2018-03-14 MED ORDER — TRAMADOL HCL 50 MG PO TABS
50.0000 mg | ORAL_TABLET | Freq: Once | ORAL | Status: AC
  Administered 2018-03-14: 50 mg via ORAL
  Filled 2018-03-14: qty 1

## 2018-03-14 NOTE — ED Notes (Signed)
Patient transported to X-ray 

## 2018-03-14 NOTE — ED Notes (Addendum)
Pt asking for additional pain medication due to tramadol not working. EDP informed. Upon d/c, pt states "im just going to call and go to another ER since you wont give me medication". Pt given OTC med information and heat/cool therapy. Pt cursing at staff. Pt offered wheelchair to leave department and pt refused states "I dont need anything from yall". Pt also given resources for primary care physician.

## 2018-03-14 NOTE — ED Notes (Signed)
Pt stated he was not sure how he would get home. This RN asked pt if he had family that could provide ride fo him and he stated that his family does not have anything to do with him

## 2018-03-14 NOTE — ED Notes (Signed)
Ambulatory with steady gate out of department

## 2018-03-14 NOTE — ED Notes (Signed)
Pt requested that no information be provided to brother when asked.
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
## 2018-03-14 NOTE — ED Notes (Signed)
Pt asleep.

## 2018-03-14 NOTE — ED Notes (Signed)
ED Provider at bedside. 

## 2018-03-14 NOTE — ED Notes (Signed)
Seizure pads placed for pt safety
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
## 2018-03-14 NOTE — ED Notes (Signed)
Vitals not updated prior to d/c
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
## 2018-04-02 ENCOUNTER — Other Ambulatory Visit: Payer: Self-pay

## 2018-04-02 ENCOUNTER — Encounter (HOSPITAL_COMMUNITY): Payer: Self-pay | Admitting: Emergency Medicine

## 2018-04-02 ENCOUNTER — Emergency Department (HOSPITAL_COMMUNITY): Payer: No Typology Code available for payment source

## 2018-04-02 ENCOUNTER — Emergency Department (HOSPITAL_COMMUNITY)
Admission: EM | Admit: 2018-04-02 | Discharge: 2018-04-02 | Disposition: A | Discharge status: Home / Self Care | Payer: No Typology Code available for payment source | Attending: Emergency Medicine | Admitting: Emergency Medicine

## 2018-04-02 DIAGNOSIS — R042 Hemoptysis: Secondary | ICD-10-CM

## 2018-04-02 DIAGNOSIS — Z87891 Personal history of nicotine dependence: Secondary | ICD-10-CM | POA: Diagnosis not present

## 2018-04-02 DIAGNOSIS — Z79899 Other long term (current) drug therapy: Secondary | ICD-10-CM | POA: Diagnosis not present

## 2018-04-02 DIAGNOSIS — R0789 Other chest pain: Secondary | ICD-10-CM | POA: Diagnosis not present

## 2018-04-02 DIAGNOSIS — I10 Essential (primary) hypertension: Secondary | ICD-10-CM | POA: Insufficient documentation

## 2018-04-02 MED ORDER — HYDROCODONE-ACETAMINOPHEN 5-325 MG PO TABS
1.0000 | ORAL_TABLET | Freq: Once | ORAL | Status: AC
  Administered 2018-04-02: 1 via ORAL
  Filled 2018-04-02: qty 1
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
## 2018-04-02 NOTE — Discharge Instructions (Signed)
Continue your home medications as directed.  You may use lidoderm, available over the counter according to label instructions.
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
## 2018-04-17 ENCOUNTER — Observation Stay (HOSPITAL_COMMUNITY)
Admission: EM | Admit: 2018-04-17 | Discharge: 2018-04-20 | Disposition: A | Discharge status: Home / Self Care | Payer: Self-pay | Attending: Internal Medicine | Admitting: Internal Medicine

## 2018-04-17 ENCOUNTER — Other Ambulatory Visit: Payer: Self-pay

## 2018-04-17 ENCOUNTER — Emergency Department (HOSPITAL_COMMUNITY): Payer: Self-pay

## 2018-04-17 ENCOUNTER — Encounter (HOSPITAL_COMMUNITY): Payer: Self-pay | Admitting: Emergency Medicine

## 2018-04-17 DIAGNOSIS — Z79899 Other long term (current) drug therapy: Secondary | ICD-10-CM | POA: Insufficient documentation

## 2018-04-17 DIAGNOSIS — K219 Gastro-esophageal reflux disease without esophagitis: Secondary | ICD-10-CM | POA: Insufficient documentation

## 2018-04-17 DIAGNOSIS — I10 Essential (primary) hypertension: Secondary | ICD-10-CM | POA: Diagnosis present

## 2018-04-17 DIAGNOSIS — R079 Chest pain, unspecified: Secondary | ICD-10-CM | POA: Diagnosis present

## 2018-04-17 DIAGNOSIS — I2699 Other pulmonary embolism without acute cor pulmonale: Principal | ICD-10-CM

## 2018-04-17 DIAGNOSIS — E876 Hypokalemia: Secondary | ICD-10-CM | POA: Insufficient documentation

## 2018-04-17 DIAGNOSIS — I2609 Other pulmonary embolism with acute cor pulmonale: Secondary | ICD-10-CM

## 2018-04-17 DIAGNOSIS — G894 Chronic pain syndrome: Secondary | ICD-10-CM | POA: Diagnosis present

## 2018-04-17 LAB — HEPATIC FUNCTION PANEL
ALBUMIN: 3.9 g/dL (ref 3.5–5.0)
ALT: 31 U/L (ref 0–44)
AST: 37 U/L (ref 15–41)
Alkaline Phosphatase: 160 U/L — ABNORMAL HIGH (ref 38–126)
Bilirubin, Direct: 0.2 mg/dL (ref 0.0–0.2)
Indirect Bilirubin: 0.5 mg/dL (ref 0.3–0.9)
Total Bilirubin: 0.7 mg/dL (ref 0.3–1.2)
Total Protein: 7.9 g/dL (ref 6.5–8.1)

## 2018-04-17 LAB — BASIC METABOLIC PANEL
Anion gap: 9 (ref 5–15)
BUN: 9 mg/dL (ref 6–20)
CO2: 25 mmol/L (ref 22–32)
Calcium: 9.1 mg/dL (ref 8.9–10.3)
Chloride: 103 mmol/L (ref 98–111)
Creatinine, Ser: 0.81 mg/dL (ref 0.61–1.24)
GFR calc Af Amer: >60 mL/min (ref >60)
GFR calc non Af Amer: >60 mL/min (ref >60)
Glucose, Bld: 118 mg/dL — ABNORMAL HIGH (ref 70–99)
Potassium: 3.5 mmol/L (ref 3.5–5.1)
SODIUM: 137 mmol/L (ref 135–145)

## 2018-04-17 LAB — CBC
HCT: 48.8 % (ref 39.0–52.0)
Hemoglobin: 15.9 g/dL (ref 13.0–17.0)
MCH: 28 pg (ref 26.0–34.0)
MCHC: 32.6 g/dL (ref 30.0–36.0)
MCV: 86.1 fL (ref 80.0–100.0)
NRBC: 0 % (ref 0.0–0.2)
Platelets: 195 10*3/uL (ref 150–400)
RBC: 5.67 MIL/uL (ref 4.22–5.81)
RDW: 12.6 % (ref 11.5–15.5)
WBC: 9.7 10*3/uL (ref 4.0–10.5)

## 2018-04-17 LAB — TROPONIN I
Troponin I: <0.03 ng/mL (ref <0.03)
Troponin I: <0.03 ng/mL (ref <0.03)

## 2018-04-17 LAB — D-DIMER, QUANTITATIVE: D-Dimer, Quant: 0.57 ug/mL-FEU — ABNORMAL HIGH (ref 0.00–0.50)

## 2018-04-17 MED ORDER — HYDROMORPHONE HCL 1 MG/ML IJ SOLN
1.0000 mg | Freq: Once | INTRAMUSCULAR | Status: AC
  Administered 2018-04-17: 1 mg via INTRAVENOUS
  Filled 2018-04-17: qty 1

## 2018-04-17 MED ORDER — FAMOTIDINE 20 MG PO TABS
20.0000 mg | ORAL_TABLET | Freq: Every day | ORAL | Status: DC
  Administered 2018-04-18 – 2018-04-20 (×3): 20 mg via ORAL
  Filled 2018-04-17 (×3): qty 1

## 2018-04-17 MED ORDER — METOPROLOL TARTRATE 25 MG PO TABS
25.0000 mg | ORAL_TABLET | Freq: Two times a day (BID) | ORAL | Status: DC
  Administered 2018-04-18 (×2): 25 mg via ORAL
  Filled 2018-04-17 (×2): qty 1

## 2018-04-17 MED ORDER — CLONIDINE HCL 0.2 MG PO TABS
0.3000 mg | ORAL_TABLET | Freq: Once | ORAL | Status: AC
  Administered 2018-04-17: 0.3 mg via ORAL
  Filled 2018-04-17: qty 1

## 2018-04-17 MED ORDER — IOPAMIDOL (ISOVUE-370) INJECTION 76%
100.0000 mL | Freq: Once | INTRAVENOUS | Status: AC | PRN
  Administered 2018-04-17: 100 mL via INTRAVENOUS

## 2018-04-17 MED ORDER — CYCLOBENZAPRINE HCL 10 MG PO TABS
10.0000 mg | ORAL_TABLET | Freq: Three times a day (TID) | ORAL | Status: DC
  Administered 2018-04-17 – 2018-04-18 (×2): 10 mg via ORAL
  Filled 2018-04-17 (×4): qty 1

## 2018-04-17 MED ORDER — HYDROCODONE-ACETAMINOPHEN 5-325 MG PO TABS
1.0000 | ORAL_TABLET | ORAL | Status: DC | PRN
  Administered 2018-04-18 (×2): 2 via ORAL
  Administered 2018-04-18 (×2): 1 via ORAL
  Administered 2018-04-19 – 2018-04-20 (×4): 2 via ORAL
  Filled 2018-04-17 (×2): qty 2
  Filled 2018-04-17: qty 1
  Filled 2018-04-17 (×5): qty 2

## 2018-04-17 MED ORDER — NITROGLYCERIN 0.4 MG SL SUBL: 0.4000 mg | SUBLINGUAL_TABLET | SUBLINGUAL | Status: DC | PRN

## 2018-04-17 MED ORDER — HEPARIN (PORCINE) 25000 UT/250ML-% IV SOLN
1700.0000 [IU]/h | INTRAVENOUS | Status: DC
  Administered 2018-04-17 – 2018-04-18 (×2): 1700 [IU]/h via INTRAVENOUS
  Filled 2018-04-17 (×3): qty 250

## 2018-04-17 MED ORDER — ONDANSETRON HCL 4 MG/2ML IJ SOLN
4.0000 mg | Freq: Four times a day (QID) | INTRAMUSCULAR | Status: DC | PRN
  Administered 2018-04-19: 4 mg via INTRAVENOUS
  Filled 2018-04-17: qty 2

## 2018-04-17 MED ORDER — CLONIDINE HCL 0.2 MG PO TABS
0.3000 mg | ORAL_TABLET | Freq: Two times a day (BID) | ORAL | Status: DC
  Administered 2018-04-18 – 2018-04-20 (×5): 0.3 mg via ORAL
  Filled 2018-04-17 (×5): qty 1

## 2018-04-17 MED ORDER — HEPARIN BOLUS VIA INFUSION
6000.0000 [IU] | Freq: Once | INTRAVENOUS | Status: AC
  Administered 2018-04-17: 6000 [IU] via INTRAVENOUS

## 2018-04-17 MED ORDER — ACETAMINOPHEN 325 MG PO TABS: 650.0000 mg | ORAL_TABLET | ORAL | Status: DC | PRN

## 2018-04-17 MED ORDER — HYDROMORPHONE HCL 4 MG PO TABS
4.0000 mg | ORAL_TABLET | Freq: Four times a day (QID) | ORAL | Status: DC | PRN
  Administered 2018-04-18 – 2018-04-20 (×5): 4 mg via ORAL
  Filled 2018-04-17: qty 2
  Filled 2018-04-17: qty 1
  Filled 2018-04-17: qty 2
  Filled 2018-04-17 (×2): qty 1

## 2018-04-17 MED ORDER — METOPROLOL TARTRATE 25 MG PO TABS
25.0000 mg | ORAL_TABLET | Freq: Once | ORAL | Status: AC
  Administered 2018-04-17: 25 mg via ORAL
  Filled 2018-04-17: qty 1

## 2018-04-17 NOTE — ED Notes (Signed)
Per EMS, patient quit taking his klonidine 4 days ago.

## 2018-04-17 NOTE — ED Provider Notes (Signed)
[facility] EMERGENCY DEPARTMENT Provider Note   CSN: [id number] Arrival date & time: 04/17/18  1811     History   Chief Complaint Chief Complaint  Patient presents with  . Chest Pain    HPI Travis [name] is a 53 y.o. male.  Patient complains of shortness of breath and chest pain.  Also he is run out of his blood pressure medicine   The history is provided by the patient. No language interpreter was used.  Chest Pain  Pain location:  Substernal area Pain quality: aching   Pain radiates to:  Does not radiate Pain severity:  Moderate Onset quality:  Sudden Timing:  Constant Progression:  Waxing and waning Chronicity:  New Context: breathing   Relieved by:  Nothing Worsened by:  Nothing Ineffective treatments:  None tried Associated symptoms: no abdominal pain, no back pain, no cough, no fatigue and no headache     Past Medical History:  Diagnosis Date  . Anxiety    Panic attacks  . Chest pain    Hospital, March, 2014, negative enzymes, patient refused in-hospital  stress test, patient canceled outpatient stress test  . Constipation   . Degenerative disk disease   . Degenerative disk disease   . Depression   . GERD (gastroesophageal reflux disease)   . Hypertension   . Incontinence of urine   . Neuromuscular disorder (HCC)   . Obesity   . Polysubstance abuse (HCC)   . PTSD (post-traumatic stress disorder)   . Seizures (HCC)   . Shortness of breath dyspnea    with exertion  . Spinal stenosis   . Spinal stenosis   . Suicide attempt (HCC)   . Tachycardia - pulse   . Tobacco abuse     Patient Active Problem List   Diagnosis Date Noted  . Bradycardia, drug induced 03/01/2018  . Hyperglycemia 03/01/2018  . Clonidine overdose 03/01/2018  . ARF (acute renal failure) (HCC) 09/01/2015  . Hypotension 09/01/2015  . Acute encephalopathy 09/01/2015  . Hematochezia 04/07/2014  . 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 Major depressive disorder, single episode,  moderate (HCC) 10/12/2013  . Morbid obesity (HCC) 10/12/2013  . Essential hypertension 10/12/2013  . Chronic pain syndrome 10/12/2013  . MDD (major depressive disorder), recurrent episode, severe (HCC) 09/29/2013  . Rectal pain 08/10/2013  . Tachycardia 07/27/2013  . Urine incontinence 07/27/2013  . Rectal bleeding 07/27/2013  . Left arm pain 03/29/2013  . Lumbar radicular pain 03/23/2013  . Bilateral shoulder pain 10/27/2012  . Smoking 10/27/2012  . Dental abscess 07/14/2012  . Hypertension   . Anxiety   . Spinal stenosis   . Tobacco abuse   . Obesity   . Dyslipidemia   . Polysubstance abuse (HCC)   . Chest pain   . Grief 06/11/2012  . Panic attack 01/21/2012  . HYPERCHOLESTEROLEMIA 02/18/2006  . DEPRESSION 02/18/2006  . GERD 02/18/2006  . HEADACHE 02/18/2006    Past Surgical History:  Procedure Laterality Date  . COLONOSCOPY N/A 08/24/2014   Procedure: COLONOSCOPY;  Surgeon: Charlott Rakes, MD;  Location: [facility] ENDOSCOPY;  Service: Endoscopy;  Laterality: N/A;  . ESOPHAGOGASTRODUODENOSCOPY (EGD) WITH PROPOFOL N/A 08/24/2014   Procedure: ESOPHAGOGASTRODUODENOSCOPY (EGD) WITH PROPOFOL;  Surgeon: Charlott Rakes, MD;  Location: [facility] ENDOSCOPY;  Service: Endoscopy;  Laterality: N/A;  . WISDOM TOOTH EXTRACTION          Home Medications    Prior to Admission medications   Medication Sig Start Date End Date Taking? Authorizing Provider  cloNIDine (CATAPRES) 0.3  MG tablet Take 1 tablet (0.3 mg total) by mouth 2 (two) times daily. 03/08/18  Yes Wallis Bamberg, PA-C  cyclobenzaprine (FLEXERIL) 10 MG tablet Take 1 tablet by mouth 3 (three) times daily. 03/16/18  Yes [provider]  famotidine (PEPCID) 20 MG tablet Take 20 mg by mouth daily.   Yes [provider]  ibuprofen (ADVIL,MOTRIN) 200 MG tablet Take 200 mg by mouth every 6 (six) hours as needed.   Yes [provider]  metoprolol tartrate (LOPRESSOR) 25 MG tablet Take 1 tablet (25 mg total) by mouth 2  (two) times daily. 02/23/18  Yes Mardella Layman, MD  traMADol (ULTRAM) 50 MG tablet Take 1 tablet (50 mg total) by mouth 3 (three) times daily as needed for severe pain. 03/08/18  Yes Wallis Bamberg, PA-C  metoprolol tartrate (LOPRESSOR) 50 MG tablet Take 0.5 tablets (25 mg total) by mouth 2 (two) times daily. 11/29/17 12/29/17  Dartha Lodge, PA-C    Family History Family History  Problem Relation Age of Onset  . Stroke Mother   . Hypertension Mother   . Hyperlipidemia Mother   . Stroke Father   . Hypertension Father   . Dementia Father   . Diabetes Father   . Heart disease Father   . Hyperlipidemia Father   . Cancer Sister        brain  . Diabetes Sister   . Heart disease Sister   . Hyperlipidemia Sister   . Hypertension Sister   . Stroke Sister   . Heart disease Brother   . Hyperlipidemia Brother     Social History Social History   Tobacco Use  . Smoking status: Former Smoker    Packs/day: 0.02    Years: 0.00    Pack years: 0.00    Types: Cigarettes    Last attempt to quit: 12/12/2016    Years since quitting: 1.3  . Smokeless tobacco: Never Used  Substance Use Topics  . Alcohol use: No  . Drug use: No     Allergies   Blueberry flavor; Penicillins; Robaxin [methocarbamol]; and Toradol [ketorolac tromethamine]   Review of Systems Review of Systems  Constitutional: Negative for appetite change and fatigue.  HENT: Negative for congestion, ear discharge and sinus pressure.   Eyes: Negative for discharge.  Respiratory: Negative for cough.   Cardiovascular: Positive for chest pain.  Gastrointestinal: Negative for abdominal pain and diarrhea.  Genitourinary: Negative for frequency and hematuria.  Musculoskeletal: Negative for back pain.  Skin: Negative for rash.  Neurological: Negative for seizures and headaches.  Psychiatric/Behavioral: Negative for hallucinations.     Physical Exam Updated Vital Signs BP (!) 123/92   Pulse 77   Temp 98.8 F (37.1 C)  (Oral)   Resp (!) 0   Ht 5\' 11"  (1.803 m)   Wt 117.9 kg   SpO2 94%   BMI 36.26 kg/m   Physical Exam Vitals signs and nursing note reviewed.  Constitutional:      Appearance: He is well-developed.  HENT:     Head: Normocephalic.  Eyes:     General: No scleral icterus.    Conjunctiva/sclera: Conjunctivae normal.  Neck:     Musculoskeletal: Neck supple.     Thyroid: No thyromegaly.  Cardiovascular:     Rate and Rhythm: Normal rate and regular rhythm.     Heart sounds: No murmur. No friction rub. No gallop.   Pulmonary:     Breath sounds: No stridor. No wheezing or rales.  Chest:  Chest wall: No tenderness.  Abdominal:     General: There is no distension.     Tenderness: There is no abdominal tenderness. There is no rebound.  Musculoskeletal: Normal range of motion.  Lymphadenopathy:     Cervical: No cervical adenopathy.  Skin:    Findings: No erythema or rash.  Neurological:     Mental Status: He is oriented to person, place, and time.     Motor: No abnormal muscle tone.     Coordination: Coordination normal.  Psychiatric:        Behavior: Behavior normal.      ED Treatments / Results  Labs (all labs ordered are listed, but only abnormal results are displayed) Labs Reviewed  BASIC METABOLIC PANEL - Abnormal; Notable for the following components:      Result Value   Glucose, Bld 118 (*)    All other components within normal limits  HEPATIC FUNCTION PANEL - Abnormal; Notable for the following components:   Alkaline Phosphatase 160 (*)    All other components within normal limits  D-DIMER, QUANTITATIVE (NOT AT [facility]) - Abnormal; Notable for the following components:   D-Dimer, Quant 0.57 (*)    All other components within normal limits  CBC  TROPONIN I    EKG EKG Interpretation  Date/Time:  Friday April 17 2018 18:16:56 EST Ventricular Rate:  102 PR Interval:    QRS Duration: 91 QT Interval:  341 QTC Calculation: 445 R Axis:   -20 Text  Interpretation:  Sinus tachycardia Probable left atrial enlargement Probable left ventricular hypertrophy Inferior infarct, old Anterior Q waves, possibly due to LVH Confirmed by Bethann Berkshire [phone]) on 04/17/2018 6:47:50 PM   Radiology Dg Chest 2 View  Result Date: 04/17/2018 CLINICAL DATA:  Central chest pain radiating into both arms. EXAM: CHEST - 2 VIEW COMPARISON:  Radiographs 04/02/2018 and 03/14/2018. FINDINGS: The heart size and mediastinal contours are stable. There is stable mild atelectasis at both lung bases and mild central airway thickening. No edema, confluent airspace opacity, pleural effusion or pneumothorax. The bones appear unchanged. Telemetry leads overlie the chest. IMPRESSION: Radiographically stable appearance of the chest without evidence of acute process. Electronically Signed   By: Carey Bullocks M.D.   On: 04/17/2018 19:17   Ct Angio Chest Pe W And/or Wo Contrast  Result Date: 04/17/2018 CLINICAL DATA:  Central chest pain radiating to bilateral arms. EXAM: CT ANGIOGRAPHY CHEST WITH CONTRAST TECHNIQUE: Multidetector CT imaging of the chest was performed using the standard protocol during bolus administration of intravenous contrast. Multiplanar CT image reconstructions and MIPs were obtained to evaluate the vascular anatomy. CONTRAST:  ISOVUE-370 IOPAMIDOL (ISOVUE-370) INJECTION 76% COMPARISON:  Chest x-ray April 17, 2018 FINDINGS: Cardiovascular: Status factory opacification of the pulmonary artery to the segmental level. There is a filling defect in a right lower lobe segmental pulmonary artery best seen on series 6, image 140. No filling defect is identified in the bilateral main pulmonary arteries. The aorta is normal. There is no evidence of aortic aneurysm or dissection. Mediastinum/Nodes: No enlarged mediastinal, hilar, or axillary lymph nodes. Thyroid gland, trachea, and esophagus demonstrate no significant findings. Lungs/Pleura: There is no focal pneumonia.  Mild atelectasis of posterior lung bases are noted. There is no evidence of pulmonary infarct. No pleural effusion or pneumothorax. Upper Abdomen: No acute abnormality. Musculoskeletal: Mild degenerative joint changes are noted. Review of the MIP images confirms the above findings. IMPRESSION: Small filling defect in a right lower lobe segmental pulmonary artery, best seen  on series 6, image 140 suspicious for pulmonary embolus. Mild atelectasis of posterior lung bases. The lungs are otherwise clear. These results will be called to the ordering clinician or representative by the Radiologist Assistant, and communication documented in the PACS or zVision Dashboard. Electronically Signed   By: Sherian ReinWei-Chen  Lin M.D.   On: 04/17/2018 21:30    Procedures Procedures (including critical care time)  Medications Ordered in ED Medications  cloNIDine (CATAPRES) tablet 0.3 mg (0.3 mg Oral Given 04/17/18 1931)  metoprolol tartrate (LOPRESSOR) tablet 25 mg (25 mg Oral Given 04/17/18 1931)  HYDROmorphone (DILAUDID) injection 1 mg (1 mg Intravenous Given 04/17/18 1931)  iopamidol (ISOVUE-370) 76 % injection 100 mL (100 mLs Intravenous Contrast Given 04/17/18 2111)     Initial Impression / Assessment and Plan / ED Course  I have reviewed the triage vital signs and the nursing notes.  Pertinent labs & imaging results that were available during my care of the patient were reviewed by me and considered in my medical decision making (see chart for details). CRITICAL CARE Performed by: Bethann BerkshireJoseph Belynda Pagaduan Total critical care time:7135minutes Critical care time was exclusive of separately billable procedures and treating other patients. Critical care was necessary to treat or prevent imminent or life-threatening deterioration. Critical care was time spent personally by me on the following activities: development of treatment plan with patient and/or surrogate as well as nursing, discussions with consultants, evaluation of  patient's response to treatment, examination of patient, obtaining history from patient or surrogate, ordering and performing treatments and interventions, ordering and review of laboratory studies, ordering and review of radiographic studies, pulse oximetry and re-evaluation of patient's condition.    CT shows pulmonary embolus.  He will be admitted to medicine  Final Clinical Impressions(s) / ED Diagnoses   Final diagnoses:  Other acute pulmonary embolism with acute cor pulmonale [facility](HCC)    ED Discharge Orders    None       Bethann BerkshireZammit, Lynette Noah, MD 04/17/18 2204

## 2018-04-17 NOTE — ED Notes (Signed)
Gave patient meal tray and drink as requested.

## 2018-04-17 NOTE — ED Triage Notes (Signed)
Pt c/o central CP that radiates down both arms and that goes straight through to back and radiates to head. States it "feels like electricity." States episodes began yesterday, but have become more frequent today. Endorses nausea and diaphoresis. EMS gave 4mg  Zofran PTA.

## 2018-04-17 NOTE — Progress Notes (Signed)
ANTICOAGULATION CONSULT NOTE - Initial Consult  Pharmacy Consult for heparin Indication: pulmonary embolus  Allergies  Allergen Reactions  . Blueberry Flavor Hives  . Penicillins Hives, Itching and Other (See Comments)    Hallucinations. Has patient had a PCN reaction causing immediate rash, facial/tongue/throat swelling, SOB or lightheadedness with hypotension:YES Has patient had a PCN reaction causing severe rash involving mucus membranes or skin necrosis: NO Has patient had a PCN reaction that required hospitalization: yes Has patient had a PCN reaction occurring within the last 10 years: NO If all of the above answers are "NO", then may proceed with Cephalosporin use.  . Robaxin [Methocarbamol] Palpitations  . Toradol [Ketorolac Tromethamine] Hives and Other (See Comments)    Headache    Patient Measurements: Height: 5\' 11"  (180.3 cm) Weight: 260 lb (117.9 kg) IBW/kg (Calculated) : 75.3 Heparin Dosing Weight: 101 kg  Vital Signs: Temp: 98.8 F (37.1 C) (02/14 1817) Temp Source: Oral (02/14 1817) BP: 142/103 (02/14 2230) Pulse Rate: 71 (02/14 2230)  Labs: Recent Labs    04/17/18 1832  HGB 15.9  HCT 48.8  PLT 195  CREATININE 0.81  TROPONINI <0.03    Estimated Creatinine Clearance: 142.4 mL/min (by C-G formula based on SCr of 0.81 mg/dL).   Medical History: Past Medical History:  Diagnosis Date  . Anxiety    Panic attacks  . Chest pain    Hospital, March, 2014, negative enzymes, patient refused in-hospital  stress test, patient canceled outpatient stress test  . Constipation   . Degenerative disk disease   . Degenerative disk disease   . Depression   . GERD (gastroesophageal reflux disease)   . Hypertension   . Incontinence of urine   . Neuromuscular disorder (HCC)   . Obesity   . Polysubstance abuse (HCC)   . PTSD (post-traumatic stress disorder)   . Seizures (HCC)   . Shortness of breath dyspnea    with exertion  . Spinal stenosis   . Spinal  stenosis   . Suicide attempt (HCC)   . Tachycardia - pulse   . Tobacco abuse     Medications:  (Not in a hospital admission)   Assessment: Pharmacy consulted to dose heparin for patient with pulmonary embolism.  D-Dimer on admission is 0.57.  Goal of Therapy:  Heparin level 0.3-0.7 units/ml Monitor platelets by anticoagulation protocol: Yes   Plan:  Give 6000 units bolus x 1 Start heparin infusion at 1700 units/hr Check anti-Xa level in 6 hours and daily while on heparin Continue to monitor H&H and platelets  Tad Moore 04/17/2018,10:48 PM

## 2018-04-17 NOTE — H&P (Signed)
History and Physical    Travis [name] DOB: [birth year] DOA: 04/17/2018  PCP: Lavinia SharpsPlacey, Mary Ann, NP   Patient coming from: Home   Chief Complaint: Chest pain   HPI: Travis [name] is a 53 y.o. male with medical history significant for hypertension, anxiety, history of substance abuse, and chronic pain, now presenting to the emergency department for evaluation of chest pain.  The patient reports that he has been experiencing some pain in the central chest, worse with breathing, for a few days.  Symptoms became worse yesterday and progressed even more today.  He describes this as a "electrical sensation," starting from the central chest and radiating to both arms, much worse with breathing.  He also reports associated shortness of breath with mild nonproductive cough.  He denies any fevers or chills.  He reports a history of blood on the toilet paper when wiping a few months ago, but no melena or hematochezia since that time.  No recent surgery or prolonged immobilization.  Denies personal or family history of VTE.  ED Course: Upon arrival to the ED, patient is found to be afebrile, saturating mid 90s on room air, slightly tachycardic, and with stable blood pressure.  EKG features sinus tachycardia with rate 102, anterior Q waves, possibly secondary to LVH.  Chest x-ray is negative for acute cardiopulmonary disease.  CTA chest is notable for small filling defect in the right lower lobe segmental pulmonary artery suspicious for pulmonary embolism.  Patient had been given his home dose of clonidine and Lopressor in the ED, as well as 1 mg IV Dilaudid.  Tachycardia resolved, he remains hemodynamically stable, and he will be observed for further evaluation and management.  Review of Systems:  All other systems reviewed and apart from HPI, are negative.  Past Medical History:  Diagnosis Date  . Anxiety    Panic attacks  . Chest pain    Hospital, March, 2014, negative enzymes, patient refused  in-hospital  stress test, patient canceled outpatient stress test  . Constipation   . Degenerative disk disease   . Degenerative disk disease   . Depression   . GERD (gastroesophageal reflux disease)   . Hypertension   . Incontinence of urine   . Neuromuscular disorder (HCC)   . Obesity   . Polysubstance abuse (HCC)   . PTSD (post-traumatic stress disorder)   . Seizures (HCC)   . Shortness of breath dyspnea    with exertion  . Spinal stenosis   . Spinal stenosis   . Suicide attempt (HCC)   . Tachycardia - pulse   . Tobacco abuse     Past Surgical History:  Procedure Laterality Date  . COLONOSCOPY N/A 08/24/2014   Procedure: COLONOSCOPY;  Surgeon: Charlott RakesVincent Schooler, MD;  [facility];  Service: Endoscopy;  Laterality: N/A;  . ESOPHAGOGASTRODUODENOSCOPY (EGD) WITH PROPOFOL N/A 08/24/2014   Procedure: ESOPHAGOGASTRODUODENOSCOPY (EGD) WITH PROPOFOL;  Surgeon: Charlott RakesVincent Schooler, MD;  Location: [facility] END[facility]COPY;  Service: Endoscopy;  Laterality: N/A;  . WISDOM TOOTH EXTRACTION       reports that he quit smoking about 16 months ago. His smoking use included cigarettes. He smoked 0.02 packs per day for 0.00 years. He has never used smokeless tobacco. He reports that he does not drink alcohol or use drugs.  Allergies  Allergen Reactions  . Blueberry Flavor Hives  . Penicillins Hives, Itching and Other (See Comments)    Hallucinations. Has patient had a PCN reaction causing immediate rash, facial/tongue/throat swelling, SOB or lightheadedness with  hypotension:YES Has patient had a PCN reaction causing severe rash involving mucus membranes or skin necrosis: NO Has patient had a PCN reaction that required hospitalization: yes Has patient had a PCN reaction occurring within the last 10 years: NO If all of the above answers are "NO", then may proceed with Cephalosporin use.  . Robaxin [Methocarbamol] Palpitations  . Toradol [Ketorolac Tromethamine] Hives and Other (See Comments)     Headache    Family History  Problem Relation Age of Onset  . Stroke Mother   . Hypertension Mother   . Hyperlipidemia Mother   . Stroke Father   . Hypertension Father   . Dementia Father   . Diabetes Father   . Heart disease Father   . Hyperlipidemia Father   . Cancer Sister        brain  . Diabetes Sister   . Heart disease Sister   . Hyperlipidemia Sister   . Hypertension Sister   . Stroke Sister   . Heart disease Brother   . Hyperlipidemia Brother      Prior to Admission medications   Medication Sig Start Date End Date Taking? Authorizing Provider  cloNIDine (CATAPRES) 0.3 MG tablet Take 1 tablet (0.3 mg total) by mouth 2 (two) times daily. 03/08/18  Yes Wallis Bamberg, PA-C  cyclobenzaprine (FLEXERIL) 10 MG tablet Take 1 tablet by mouth 3 (three) times daily. 03/16/18  Yes [provider]  famotidine (PEPCID) 20 MG tablet Take 20 mg by mouth daily.   Yes [provider]  ibuprofen (ADVIL,MOTRIN) 200 MG tablet Take 200 mg by mouth every 6 (six) hours as needed.   Yes [provider]  metoprolol tartrate (LOPRESSOR) 25 MG tablet Take 1 tablet (25 mg total) by mouth 2 (two) times daily. 02/23/18  Yes Mardella Layman, MD  traMADol (ULTRAM) 50 MG tablet Take 1 tablet (50 mg total) by mouth 3 (three) times daily as needed for severe pain. 03/08/18  Yes Wallis Bamberg, PA-C  metoprolol tartrate (LOPRESSOR) 50 MG tablet Take 0.5 tablets (25 mg total) by mouth 2 (two) times daily. 11/29/17 12/29/17  Dartha Lodge, PA-C    Physical Exam: Vitals:   04/17/18 1930 04/17/18 2000 04/17/18 2030 04/17/18 2214  BP: (!) 153/111 (!) 141/101 (!) 123/92 (!) 141/97  Pulse: 96 88 77 73  Resp: 18 (!) 9 (!) 0 14  Temp:      TempSrc:      SpO2: 98% 94% 94% 98%  Weight:      Height:        Constitutional: NAD, calm  Eyes: PERTLA, lids and conjunctivae normal ENMT: Mucous membranes are moist. Posterior pharynx clear of any exudate or lesions.   Neck: normal, supple, no  masses, no thyromegaly Respiratory: clear to auscultation bilaterally, no wheezing, no crackles. Normal respiratory effort.    Cardiovascular: S1 & S2 heard, regular rate and rhythm. No extremity edema.  Abdomen: No distension, no tenderness, soft. Bowel sounds normal.  Musculoskeletal: no clubbing / cyanosis. No joint deformity upper and lower extremities.    Skin: no significant rashes, lesions, ulcers. Warm, dry, well-perfused. Neurologic: CN 2-12 grossly intact. Sensation intact, DTR normal. Strength 5/5 in all 4 limbs.  Psychiatric: Alert and oriented x 3. Calm, cooperative.    Labs on Admission: I have personally reviewed following labs and imaging studies  CBC: Recent Labs  Lab 04/17/18 1832  WBC 9.7  HGB 15.9  HCT 48.8  MCV 86.1  PLT 195   Basic Metabolic Panel:  Recent Labs  Lab 04/17/18 1832  NA 137  K 3.5  CL 103  CO2 25  GLUCOSE 118*  BUN 9  CREATININE 0.81  CALCIUM 9.1   GFR: Estimated Creatinine Clearance: 142.4 mL/min (by C-G formula based on SCr of 0.81 mg/dL). Liver Function Tests: Recent Labs  Lab 04/17/18 1832  AST 37  ALT 31  ALKPHOS 160*  BILITOT 0.7  PROT 7.9  ALBUMIN 3.9   No results for input(s): LIPASE, AMYLASE in the last 168 hours. No results for input(s): AMMONIA in the last 168 hours. Coagulation Profile: No results for input(s): INR, PROTIME in the last 168 hours. Cardiac Enzymes: Recent Labs  Lab 04/17/18 1832  TROPONINI <0.03   BNP (last 3 results) No results for input(s): PROBNP in the last 8760 hours. HbA1C: No results for input(s): HGBA1C in the last 72 hours. CBG: No results for input(s): GLUCAP in the last 168 hours. Lipid Profile: No results for input(s): CHOL, HDL, LDLCALC, TRIG, CHOLHDL, LDLDIRECT in the last 72 hours. Thyroid Function Tests: No results for input(s): TSH, T4TOTAL, FREET4, T3FREE, THYROIDAB in the last 72 hours. Anemia Panel: No results for input(s): VITAMINB12, FOLATE, FERRITIN, TIBC, IRON,  RETICCTPCT in the last 72 hours. Urine analysis:    Component Value Date/Time   COLORURINE COLORLESS (A) 03/05/2018 1830   APPEARANCEUR CLEAR 03/05/2018 1830   LABSPEC 1.001 (L) 03/05/2018 1830   PHURINE 6.0 03/05/2018 1830   GLUCOSEU NEGATIVE 03/05/2018 1830   GLUCOSEU NEG mg/dL 16/12/9602 5409   HGBUR NEGATIVE 03/05/2018 1830   BILIRUBINUR NEGATIVE 03/05/2018 1830   BILIRUBINUR SMALL 08/16/2013 1351   KETONESUR NEGATIVE 03/05/2018 1830   PROTEINUR NEGATIVE 03/05/2018 1830   UROBILINOGEN 0.2 04/05/2014 0127   NITRITE NEGATIVE 03/05/2018 1830   LEUKOCYTESUR NEGATIVE 03/05/2018 1830   Sepsis Labs: @LABRCNTIP (procalcitonin:4,lacticidven:4) )No results found for this or any previous visit (from the past 240 hour(s)).   Radiological Exams on Admission: Dg Chest 2 View  Result Date: 04/17/2018 CLINICAL DATA:  Central chest pain radiating into both arms. EXAM: CHEST - 2 VIEW COMPARISON:  Radiographs 04/02/2018 and 03/14/2018. FINDINGS: The heart size and mediastinal contours are stable. There is stable mild atelectasis at both lung bases and mild central airway thickening. No edema, confluent airspace opacity, pleural effusion or pneumothorax. The bones appear unchanged. Telemetry leads overlie the chest. IMPRESSION: Radiographically stable appearance of the chest without evidence of acute process. Electronically Signed   By: Carey Bullocks M.D.   On: 04/17/2018 19:17   Ct Angio Chest Pe W And/or Wo Contrast  Result Date: 04/17/2018 CLINICAL DATA:  Central chest pain radiating to bilateral arms. EXAM: CT ANGIOGRAPHY CHEST WITH CONTRAST TECHNIQUE: Multidetector CT imaging of the chest was performed using the standard protocol during bolus administration of intravenous contrast. Multiplanar CT image reconstructions and MIPs were obtained to evaluate the vascular anatomy. CONTRAST:  ISOVUE-370 IOPAMIDOL (ISOVUE-370) INJECTION 76% COMPARISON:  Chest x-ray April 17, 2018 FINDINGS:  Cardiovascular: Status factory opacification of the pulmonary artery to the segmental level. There is a filling defect in a right lower lobe segmental pulmonary artery best seen on series 6, image 140. No filling defect is identified in the bilateral main pulmonary arteries. The aorta is normal. There is no evidence of aortic aneurysm or dissection. Mediastinum/Nodes: No enlarged mediastinal, hilar, or axillary lymph nodes. Thyroid gland, trachea, and esophagus demonstrate no significant findings. Lungs/Pleura: There is no focal pneumonia. Mild atelectasis of posterior lung bases are noted. There is no evidence of pulmonary  infarct. No pleural effusion or pneumothorax. Upper Abdomen: No acute abnormality. Musculoskeletal: Mild degenerative joint changes are noted. Review of the MIP images confirms the above findings. IMPRESSION: Small filling defect in a right lower lobe segmental pulmonary artery, best seen on series 6, image 140 suspicious for pulmonary embolus. Mild atelectasis of posterior lung bases. The lungs are otherwise clear. These results will be called to the ordering clinician or representative by the Radiologist Assistant, and communication documented in the PACS or zVision Dashboard. Electronically Signed   By: Sherian Rein M.D.   On: 04/17/2018 21:30    EKG: Independently reviewed. Sinus tachycardia (rate 102), anterior Q-wave, possibly from LVH.   Assessment/Plan   1. Chest pain; pulmonary embolism  - Presents with pleuritic chest pain, mainly in center and left of center, found to have RLL segmental PE on CTA  - Chest pain likely secondary to PE, though pain mainly substernal and left-sided  - Start IV heparin, continue cardiac monitoring, check serial troponin measurements    2. Hypertension  - Continue clonidine, continue Lopressor   3. Chronic pain  - Continue Flexeril - He has an acute painful condition, but tolerating oral intake, will limit analgesia to PO    DVT  prophylaxis: IV heparin  Code Status: Full  Family Communication: Discussed with patient  Consults called: None  Admission status: Observation     Briscoe Deutscher, MD [facility] Pager [phone]  If 7PM-7AM, please contact night-coverage [email or web address] Password [facility]  04/17/2018, 10:17 PM

## 2018-04-18 ENCOUNTER — Observation Stay (HOSPITAL_COMMUNITY): Payer: Self-pay

## 2018-04-18 ENCOUNTER — Encounter (HOSPITAL_COMMUNITY): Payer: Self-pay

## 2018-04-18 DIAGNOSIS — I2690 Septic pulmonary embolism without acute cor pulmonale: Secondary | ICD-10-CM

## 2018-04-18 LAB — CBC
HCT: 45.2 % (ref 39.0–52.0)
HEMOGLOBIN: 14.9 g/dL (ref 13.0–17.0)
MCH: 29 pg (ref 26.0–34.0)
MCHC: 33 g/dL (ref 30.0–36.0)
MCV: 88.1 fL (ref 80.0–100.0)
Platelets: 187 10*3/uL (ref 150–400)
RBC: 5.13 MIL/uL (ref 4.22–5.81)
RDW: 12.8 % (ref 11.5–15.5)
WBC: 10.6 10*3/uL — AB (ref 4.0–10.5)
nRBC: 0 % (ref 0.0–0.2)

## 2018-04-18 LAB — BASIC METABOLIC PANEL
Anion gap: 9 (ref 5–15)
BUN: 9 mg/dL (ref 6–20)
CO2: 27 mmol/L (ref 22–32)
Calcium: 8.5 mg/dL — ABNORMAL LOW (ref 8.9–10.3)
Chloride: 98 mmol/L (ref 98–111)
Creatinine, Ser: 0.84 mg/dL (ref 0.61–1.24)
GFR calc Af Amer: >60 mL/min (ref >60)
GFR calc non Af Amer: >60 mL/min (ref >60)
Glucose, Bld: 161 mg/dL — ABNORMAL HIGH (ref 70–99)
Potassium: 2.9 mmol/L — ABNORMAL LOW (ref 3.5–5.1)
Sodium: 134 mmol/L — ABNORMAL LOW (ref 135–145)

## 2018-04-18 LAB — TROPONIN I
Troponin I: <0.03 ng/mL (ref <0.03)
Troponin I: <0.03 ng/mL (ref <0.03)

## 2018-04-18 LAB — HEPARIN LEVEL (UNFRACTIONATED): Heparin Unfractionated: 0.49 IU/mL (ref 0.30–0.70)

## 2018-04-18 MED ORDER — POTASSIUM CHLORIDE CRYS ER 20 MEQ PO TBCR
40.0000 meq | EXTENDED_RELEASE_TABLET | Freq: Four times a day (QID) | ORAL | Status: AC
  Administered 2018-04-18 (×2): 40 meq via ORAL
  Filled 2018-04-18 (×2): qty 2

## 2018-04-18 MED ORDER — IBUPROFEN 400 MG PO TABS
400.0000 mg | ORAL_TABLET | Freq: Once | ORAL | Status: AC
  Administered 2018-04-18: 400 mg via ORAL
  Filled 2018-04-18: qty 1
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
## 2018-04-18 NOTE — Progress Notes (Signed)
ANTICOAGULATION CONSULT NOTE -  Pharmacy Consult for heparin Indication: pulmonary embolus  Allergies  Allergen Reactions  . Blueberry Flavor Hives  . Penicillins Hives, Itching and Other (See Comments)    Hallucinations. Has patient had a PCN reaction causing immediate rash, facial/tongue/throat swelling, SOB or lightheadedness with hypotension:YES Has patient had a PCN reaction causing severe rash involving mucus membranes or skin necrosis: NO Has patient had a PCN reaction that required hospitalization: yes Has patient had a PCN reaction occurring within the last 10 years: NO If all of the above answers are "NO", then may proceed with Cephalosporin use.  . Robaxin [Methocarbamol] Palpitations  . Toradol [Ketorolac Tromethamine] Hives and Other (See Comments)    Headache    Patient Measurements: Height: 5\' 11"  (180.3 cm) Weight: 260 lb (117.9 kg) IBW/kg (Calculated) : 75.3 Heparin Dosing Weight: 101 kg  Vital Signs: BP: 158/117 (02/15 0801) Pulse Rate: 92 (02/15 0801)  Labs: Recent Labs    04/17/18 1832 04/17/18 2226 04/18/18 0449 04/18/18 0450  HGB 15.9  --  14.9  --   HCT 48.8  --  45.2  --   PLT 195  --  187  --   HEPARINUNFRC  --   --   --  0.49  CREATININE 0.81  --  0.84  --   TROPONINI <0.03 <0.03 <0.03  --     Estimated Creatinine Clearance: 137.4 mL/min (by C-G formula based on SCr of 0.84 mg/dL).   Medical History: Past Medical History:  Diagnosis Date  . Anxiety    Panic attacks  . Chest pain    Hospital, March, 2014, negative enzymes, patient refused in-hospital  stress test, patient canceled outpatient stress test  . Constipation   . Degenerative disk disease   . Degenerative disk disease   . Depression   . GERD (gastroesophageal reflux disease)   . Hypertension   . Incontinence of urine   . Neuromuscular disorder (HCC)   . Obesity   . Polysubstance abuse (HCC)   . PTSD (post-traumatic stress disorder)   . Seizures (HCC)   . Shortness of  breath dyspnea    with exertion  . Spinal stenosis   . Spinal stenosis   . Suicide attempt (HCC)   . Tachycardia - pulse   . Tobacco abuse     Medications:  (Not in a hospital admission)   Assessment: Pharmacy consulted to dose heparin for patient with pulmonary embolism.  D-Dimer on admission is 0.57.  Heparin level therapeutic at 0.49  Goal of Therapy:  Heparin level 0.3-0.7 units/ml Monitor platelets by anticoagulation protocol: Yes   Plan:  Continue heparin infusion at 1700 units/hr Check anti-Xa level daily while on heparin Continue to monitor H&H and platelets.   Tad Moore 04/18/2018,8:05 AM

## 2018-04-18 NOTE — ED Notes (Signed)
Pt reports he does not want to take anymore Flexeril "because it has been known to cause me headaches".

## 2018-04-19 DIAGNOSIS — I2692 Saddle embolus of pulmonary artery without acute cor pulmonale: Secondary | ICD-10-CM

## 2018-04-19 LAB — BASIC METABOLIC PANEL WITH GFR
Anion gap: 10 (ref 5–15)
BUN: 8 mg/dL (ref 6–20)
CO2: 24 mmol/L (ref 22–32)
Calcium: 8.4 mg/dL — ABNORMAL LOW (ref 8.9–10.3)
Chloride: 101 mmol/L (ref 98–111)
Creatinine, Ser: 0.8 mg/dL (ref 0.61–1.24)
GFR calc Af Amer: >60 mL/min
GFR calc non Af Amer: >60 mL/min
Glucose, Bld: 145 mg/dL — ABNORMAL HIGH (ref 70–99)
Potassium: 3.5 mmol/L (ref 3.5–5.1)
Sodium: 135 mmol/L (ref 135–145)

## 2018-04-19 LAB — CBC
HCT: 45.4 % (ref 39.0–52.0)
Hemoglobin: 14.3 g/dL (ref 13.0–17.0)
MCH: 28 pg (ref 26.0–34.0)
MCHC: 31.5 g/dL (ref 30.0–36.0)
MCV: 88.8 fL (ref 80.0–100.0)
Platelets: 158 10*3/uL (ref 150–400)
RBC: 5.11 MIL/uL (ref 4.22–5.81)
RDW: 12.9 % (ref 11.5–15.5)
WBC: 6.7 10*3/uL (ref 4.0–10.5)
nRBC: 0 % (ref 0.0–0.2)

## 2018-04-19 LAB — HEPARIN LEVEL (UNFRACTIONATED): Heparin Unfractionated: 0.32 [IU]/mL (ref 0.30–0.70)

## 2018-04-19 LAB — MAGNESIUM: Magnesium: 2 mg/dL (ref 1.7–2.4)

## 2018-04-19 MED ORDER — APIXABAN 5 MG PO TABS
10.0000 mg | ORAL_TABLET | Freq: Two times a day (BID) | ORAL | Status: DC
  Administered 2018-04-19 – 2018-04-20 (×3): 10 mg via ORAL
  Filled 2018-04-19 (×3): qty 2

## 2018-04-19 MED ORDER — METOPROLOL TARTRATE 50 MG PO TABS
50.0000 mg | ORAL_TABLET | Freq: Two times a day (BID) | ORAL | Status: DC
  Administered 2018-04-19 – 2018-04-20 (×3): 50 mg via ORAL
  Filled 2018-04-19 (×3): qty 1

## 2018-04-19 MED ORDER — APIXABAN 5 MG PO TABS: 5.0000 mg | ORAL_TABLET | Freq: Two times a day (BID) | ORAL | Status: DC

## 2018-04-19 MED ORDER — POTASSIUM CHLORIDE CRYS ER 20 MEQ PO TBCR
20.0000 meq | EXTENDED_RELEASE_TABLET | Freq: Once | ORAL | Status: AC
  Administered 2018-04-19: 20 meq via ORAL
  Filled 2018-04-19: qty 1

## 2018-04-19 NOTE — Progress Notes (Signed)
ANTICOAGULATION CONSULT NOTE -  Pharmacy Consult for heparin Indication: pulmonary embolus  Allergies  Allergen Reactions  . Blueberry Flavor Hives  . Penicillins Hives, Itching and Other (See Comments)    Hallucinations. Has patient had a PCN reaction causing immediate rash, facial/tongue/throat swelling, SOB or lightheadedness with hypotension:YES Has patient had a PCN reaction causing severe rash involving mucus membranes or skin necrosis: NO Has patient had a PCN reaction that required hospitalization: yes Has patient had a PCN reaction occurring within the last 10 years: NO If all of the above answers are "NO", then may proceed with Cephalosporin use.  . Robaxin [Methocarbamol] Palpitations  . Toradol [Ketorolac Tromethamine] Hives and Other (See Comments)    Headache    Patient Measurements: Height: 5\' 11"  (180.3 cm) Weight: 260 lb (117.9 kg) IBW/kg (Calculated) : 75.3 Heparin Dosing Weight: 101 kg  Vital Signs: Temp: 99.4 F (37.4 C) (02/16 0516) Temp Source: Oral (02/16 0516) BP: 156/116 (02/16 0516) Pulse Rate: 89 (02/16 0516)  Labs: Recent Labs    04/17/18 1832 04/17/18 2226 04/18/18 0449 04/18/18 0450 04/18/18 1019 04/19/18 0633  HGB 15.9  --  14.9  --   --  14.3  HCT 48.8  --  45.2  --   --  45.4  PLT 195  --  187  --   --  158  HEPARINUNFRC  --   --   --  0.49  --  0.32  CREATININE 0.81  --  0.84  --   --  0.80  TROPONINI <0.03 <0.03 <0.03  --  <0.03  --     Estimated Creatinine Clearance: 144.2 mL/min (by C-G formula based on SCr of 0.8 mg/dL).   Medical History: Past Medical History:  Diagnosis Date  . Anxiety    Panic attacks  . Chest pain    Hospital, March, 2014, negative enzymes, patient refused in-hospital  stress test, patient canceled outpatient stress test  . Constipation   . Degenerative disk disease   . Degenerative disk disease   . Depression   . GERD (gastroesophageal reflux disease)   . Hypertension   . Incontinence of urine    . Neuromuscular disorder (HCC)   . Obesity   . Polysubstance abuse (HCC)   . PTSD (post-traumatic stress disorder)   . Seizures (HCC)   . Shortness of breath dyspnea    with exertion  . Spinal stenosis   . Spinal stenosis   . Suicide attempt (HCC)   . Tachycardia - pulse   . Tobacco abuse     Medications:  Medications Prior to Admission  Medication Sig Dispense Refill Last Dose  . cloNIDine (CATAPRES) 0.3 MG tablet Take 1 tablet (0.3 mg total) by mouth 2 (two) times daily. 60 tablet 0 04/16/2018 at Unknown time  . cyclobenzaprine (FLEXERIL) 10 MG tablet Take 1 tablet by mouth 3 (three) times daily.   04/16/2018 at Unknown time  . famotidine (PEPCID) 20 MG tablet Take 20 mg by mouth daily.   04/16/2018 at Unknown time  . ibuprofen (ADVIL,MOTRIN) 200 MG tablet Take 200 mg by mouth every 6 (six) hours as needed.   04/17/2018 at Unknown time  . metoprolol tartrate (LOPRESSOR) 25 MG tablet Take 1 tablet (25 mg total) by mouth 2 (two) times daily. 30 tablet 1 04/14/2018 at 2000  . traMADol (ULTRAM) 50 MG tablet Take 1 tablet (50 mg total) by mouth 3 (three) times daily as needed for severe pain. 20 tablet 0 04/17/2018 at Unknown time  .  metoprolol tartrate (LOPRESSOR) 50 MG tablet Take 0.5 tablets (25 mg total) by mouth 2 (two) times daily. 30 tablet 0 12-10-17 at Unknown time    Assessment: Pharmacy consulted to dose heparin for patient with pulmonary embolism.  D-Dimer on admission is 0.57.  Heparin level therapeutic at 0.32  Goal of Therapy:  Heparin level 0.3-0.7 units/ml Monitor platelets by anticoagulation protocol: Yes   Plan:  Continue heparin infusion at 1700 units/hr Check anti-Xa level daily while on heparin Continue to monitor H&H and platelets.   Travis [name] 04/19/2018,8:12 AM

## 2018-04-19 NOTE — Progress Notes (Signed)
ANTICOAGULATION CONSULT NOTE -  Pharmacy Consult for apixaban Indication: pulmonary embolus  Allergies  Allergen Reactions  . Blueberry Flavor Hives  . Penicillins Hives, Itching and Other (See Comments)    Hallucinations. Has patient had a PCN reaction causing immediate rash, facial/tongue/throat swelling, SOB or lightheadedness with hypotension:YES Has patient had a PCN reaction causing severe rash involving mucus membranes or skin necrosis: NO Has patient had a PCN reaction that required hospitalization: yes Has patient had a PCN reaction occurring within the last 10 years: NO If all of the above answers are "NO", then may proceed with Cephalosporin use.  . Robaxin [Methocarbamol] Palpitations  . Toradol [Ketorolac Tromethamine] Hives and Other (See Comments)    Headache    Patient Measurements: Height: 5\' 11"  (180.3 cm) Weight: 260 lb (117.9 kg) IBW/kg (Calculated) : 75.3 Heparin Dosing Weight: 101 kg  Vital Signs: Temp: 99.4 F (37.4 C) (02/16 0516) Temp Source: Oral (02/16 0516) BP: 156/116 (02/16 0516) Pulse Rate: 89 (02/16 0516)  Labs: Recent Labs    04/17/18 1832 04/17/18 2226 04/18/18 0449 04/18/18 0450 04/18/18 1019 04/19/18 0633  HGB 15.9  --  14.9  --   --  14.3  HCT 48.8  --  45.2  --   --  45.4  PLT 195  --  187  --   --  158  HEPARINUNFRC  --   --   --  0.49  --  0.32  CREATININE 0.81  --  0.84  --   --  0.80  TROPONINI <0.03 <0.03 <0.03  --  <0.03  --     Estimated Creatinine Clearance: 144.2 mL/min (by C-G formula based on SCr of 0.8 mg/dL).   Medical History: Past Medical History:  Diagnosis Date  . Anxiety    Panic attacks  . Chest pain    Hospital, March, 2014, negative enzymes, patient refused in-hospital  stress test, patient canceled outpatient stress test  . Constipation   . Degenerative disk disease   . Degenerative disk disease   . Depression   . GERD (gastroesophageal reflux disease)   . Hypertension   . Incontinence of  urine   . Neuromuscular disorder (HCC)   . Obesity   . Polysubstance abuse (HCC)   . PTSD (post-traumatic stress disorder)   . Seizures (HCC)   . Shortness of breath dyspnea    with exertion  . Spinal stenosis   . Spinal stenosis   . Suicide attempt (HCC)   . Tachycardia - pulse   . Tobacco abuse     Medications:  Medications Prior to Admission  Medication Sig Dispense Refill Last Dose  . cloNIDine (CATAPRES) 0.3 MG tablet Take 1 tablet (0.3 mg total) by mouth 2 (two) times daily. 60 tablet 0 04/16/2018 at Unknown time  . cyclobenzaprine (FLEXERIL) 10 MG tablet Take 1 tablet by mouth 3 (three) times daily.   04/16/2018 at Unknown time  . famotidine (PEPCID) 20 MG tablet Take 20 mg by mouth daily.   04/16/2018 at Unknown time  . ibuprofen (ADVIL,MOTRIN) 200 MG tablet Take 200 mg by mouth every 6 (six) hours as needed.   04/17/2018 at Unknown time  . metoprolol tartrate (LOPRESSOR) 25 MG tablet Take 1 tablet (25 mg total) by mouth 2 (two) times daily. 30 tablet 1 04/14/2018 at 2000  . traMADol (ULTRAM) 50 MG tablet Take 1 tablet (50 mg total) by mouth 3 (three) times daily as needed for severe pain. 20 tablet 0 04/17/2018 at Unknown time  .  metoprolol tartrate (LOPRESSOR) 50 MG tablet Take 0.5 tablets (25 mg total) by mouth 2 (two) times daily. 30 tablet 0 12-10-17 at Unknown time    Assessment: Pharmacy consulted to dose heparin for patient with pulmonary embolism.  D-Dimer on admission is 0.57.  Transitioning from heparin IV to apixaban  Goal of Therapy:  Heparin level 0.3-0.7 units/ml Monitor platelets by anticoagulation protocol: Yes   Plan:  Discontinue heparin drip Start apixaban 10 mg twice daily for 7 days Followed by apixaban 5 mg twice daily Continue to monitor H&H and platelets.   Tad Moore 04/19/2018,9:24 AM
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
## 2018-04-20 ENCOUNTER — Observation Stay (HOSPITAL_BASED_OUTPATIENT_CLINIC_OR_DEPARTMENT_OTHER): Payer: Self-pay

## 2018-04-20 DIAGNOSIS — I2699 Other pulmonary embolism without acute cor pulmonale: Secondary | ICD-10-CM

## 2018-04-20 LAB — ECHOCARDIOGRAM COMPLETE
Height: 71 in
Weight: 4160 oz

## 2018-04-20 LAB — CBC
HEMATOCRIT: 45.7 % (ref 39.0–52.0)
Hemoglobin: 14.8 g/dL (ref 13.0–17.0)
MCH: 28.9 pg (ref 26.0–34.0)
MCHC: 32.4 g/dL (ref 30.0–36.0)
MCV: 89.3 fL (ref 80.0–100.0)
Platelets: 157 10*3/uL (ref 150–400)
RBC: 5.12 MIL/uL (ref 4.22–5.81)
RDW: 12.5 % (ref 11.5–15.5)
WBC: 8 10*3/uL (ref 4.0–10.5)
nRBC: 0 % (ref 0.0–0.2)

## 2018-04-20 MED ORDER — APIXABAN 5 MG PO TABS: 10.0000 mg | ORAL_TABLET | Freq: Two times a day (BID) | ORAL | 0 refills | Status: DC

## 2018-04-20 MED ORDER — HYDROCODONE-ACETAMINOPHEN 5-325 MG PO TABS: 2.0000 | ORAL_TABLET | Freq: Four times a day (QID) | ORAL | 0 refills | Status: DC | PRN

## 2018-04-20 MED ORDER — CLONIDINE HCL 0.3 MG PO TABS: 0.3000 mg | ORAL_TABLET | Freq: Two times a day (BID) | ORAL | 0 refills | Status: DC

## 2018-04-20 MED ORDER — METOPROLOL TARTRATE 50 MG PO TABS: 50.0000 mg | ORAL_TABLET | Freq: Two times a day (BID) | ORAL | 1 refills | Status: DC

## 2018-04-20 NOTE — Progress Notes (Signed)
2D Echocardiogram has been performed.  Travis [name] 04/20/2018, 10:45 AM
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
## 2018-04-28 ENCOUNTER — Other Ambulatory Visit: Payer: Self-pay

## 2018-04-28 ENCOUNTER — Encounter (HOSPITAL_COMMUNITY): Payer: Self-pay | Admitting: Emergency Medicine

## 2018-04-28 ENCOUNTER — Emergency Department (HOSPITAL_COMMUNITY): Payer: Self-pay

## 2018-04-28 ENCOUNTER — Emergency Department (HOSPITAL_COMMUNITY)
Admission: EM | Admit: 2018-04-28 | Discharge: 2018-04-28 | Disposition: A | Discharge status: Home / Self Care | Payer: Self-pay | Attending: Emergency Medicine | Admitting: Emergency Medicine

## 2018-04-28 DIAGNOSIS — I10 Essential (primary) hypertension: Secondary | ICD-10-CM | POA: Insufficient documentation

## 2018-04-28 DIAGNOSIS — Z87891 Personal history of nicotine dependence: Secondary | ICD-10-CM | POA: Insufficient documentation

## 2018-04-28 DIAGNOSIS — R079 Chest pain, unspecified: Secondary | ICD-10-CM | POA: Insufficient documentation

## 2018-04-28 DIAGNOSIS — F329 Major depressive disorder, single episode, unspecified: Secondary | ICD-10-CM | POA: Insufficient documentation

## 2018-04-28 DIAGNOSIS — F419 Anxiety disorder, unspecified: Secondary | ICD-10-CM | POA: Insufficient documentation

## 2018-04-28 LAB — CBC WITH DIFFERENTIAL/PLATELET
Abs Immature Granulocytes: 0.02 10*3/uL (ref 0.00–0.07)
BASOS ABS: 0.1 10*3/uL (ref 0.0–0.1)
Basophils Relative: 1 %
Eosinophils Absolute: 0.1 10*3/uL (ref 0.0–0.5)
Eosinophils Relative: 1 %
HCT: 49.2 % (ref 39.0–52.0)
Hemoglobin: 15.6 g/dL (ref 13.0–17.0)
IMMATURE GRANULOCYTES: 0 %
Lymphocytes Relative: 29 %
Lymphs Abs: 2.7 10*3/uL (ref 0.7–4.0)
MCH: 27.4 pg (ref 26.0–34.0)
MCHC: 31.7 g/dL (ref 30.0–36.0)
MCV: 86.5 fL (ref 80.0–100.0)
Monocytes Absolute: 0.7 10*3/uL (ref 0.1–1.0)
Monocytes Relative: 7 %
NEUTROS ABS: 5.8 10*3/uL (ref 1.7–7.7)
Neutrophils Relative %: 62 %
Platelets: 201 10*3/uL (ref 150–400)
RBC: 5.69 MIL/uL (ref 4.22–5.81)
RDW: 12.9 % (ref 11.5–15.5)
WBC: 9.3 10*3/uL (ref 4.0–10.5)
nRBC: 0 % (ref 0.0–0.2)

## 2018-04-28 LAB — COMPREHENSIVE METABOLIC PANEL
ALBUMIN: 4.2 g/dL (ref 3.5–5.0)
ALT: 16 U/L (ref 0–44)
AST: 22 U/L (ref 15–41)
Alkaline Phosphatase: 153 U/L — ABNORMAL HIGH (ref 38–126)
Anion gap: 10 (ref 5–15)
BUN: 9 mg/dL (ref 6–20)
CO2: 24 mmol/L (ref 22–32)
Calcium: 9.4 mg/dL (ref 8.9–10.3)
Chloride: 103 mmol/L (ref 98–111)
Creatinine, Ser: 0.91 mg/dL (ref 0.61–1.24)
GFR calc Af Amer: >60 mL/min (ref >60)
GFR calc non Af Amer: >60 mL/min (ref >60)
Glucose, Bld: 135 mg/dL — ABNORMAL HIGH (ref 70–99)
Potassium: 3.4 mmol/L — ABNORMAL LOW (ref 3.5–5.1)
Sodium: 137 mmol/L (ref 135–145)
Total Bilirubin: 0.8 mg/dL (ref 0.3–1.2)
Total Protein: 8.4 g/dL — ABNORMAL HIGH (ref 6.5–8.1)

## 2018-04-28 LAB — TROPONIN I
Troponin I: <0.03 ng/mL (ref <0.03)
Troponin I: <0.03 ng/mL (ref <0.03)

## 2018-04-28 LAB — BRAIN NATRIURETIC PEPTIDE: B Natriuretic Peptide: 31 pg/mL (ref 0.0–100.0)

## 2018-04-28 MED ORDER — CLONIDINE HCL 0.3 MG PO TABS: 0.3000 mg | ORAL_TABLET | Freq: Two times a day (BID) | ORAL | 0 refills | Status: DC

## 2018-04-28 MED ORDER — IOPAMIDOL (ISOVUE-370) INJECTION 76%
150.0000 mL | Freq: Once | INTRAVENOUS | Status: AC | PRN
  Administered 2018-04-28: 100 mL via INTRAVENOUS

## 2018-04-28 MED ORDER — HYDROCODONE-ACETAMINOPHEN 5-325 MG PO TABS
2.0000 | ORAL_TABLET | ORAL | Status: DC | PRN
  Administered 2018-04-28 (×2): 2 via ORAL
  Filled 2018-04-28 (×2): qty 2

## 2018-04-28 MED ORDER — CLONIDINE HCL 0.2 MG PO TABS
0.3000 mg | ORAL_TABLET | Freq: Once | ORAL | Status: AC
  Administered 2018-04-28: 0.3 mg via ORAL
  Filled 2018-04-28: qty 1

## 2018-04-28 MED ORDER — ONDANSETRON HCL 4 MG/2ML IJ SOLN
4.0000 mg | Freq: Once | INTRAMUSCULAR | Status: AC
  Administered 2018-04-28: 4 mg via INTRAVENOUS
  Filled 2018-04-28: qty 2

## 2018-04-28 MED ORDER — NITROGLYCERIN 0.4 MG SL SUBL
0.4000 mg | SUBLINGUAL_TABLET | SUBLINGUAL | Status: DC | PRN
  Administered 2018-04-28: 0.4 mg via SUBLINGUAL
  Filled 2018-04-28: qty 1

## 2018-04-28 NOTE — ED Triage Notes (Signed)
Pt was seen last week for sob and found pt has a PE in right lung,  Pt woke up this morning with sharp pain in left chest with sob. On blood thinner at home
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
## 2018-05-28 ENCOUNTER — Emergency Department (HOSPITAL_COMMUNITY): Payer: Self-pay

## 2018-05-28 ENCOUNTER — Emergency Department (HOSPITAL_COMMUNITY)
Admission: EM | Admit: 2018-05-28 | Discharge: 2018-05-28 | Disposition: A | Discharge status: Home / Self Care | Payer: Self-pay | Attending: Emergency Medicine | Admitting: Emergency Medicine

## 2018-05-28 ENCOUNTER — Encounter (HOSPITAL_COMMUNITY): Payer: Self-pay

## 2018-05-28 ENCOUNTER — Other Ambulatory Visit: Payer: Self-pay

## 2018-05-28 DIAGNOSIS — F17210 Nicotine dependence, cigarettes, uncomplicated: Secondary | ICD-10-CM | POA: Insufficient documentation

## 2018-05-28 DIAGNOSIS — S4992XA Unspecified injury of left shoulder and upper arm, initial encounter: Secondary | ICD-10-CM | POA: Insufficient documentation

## 2018-05-28 DIAGNOSIS — Z760 Encounter for issue of repeat prescription: Secondary | ICD-10-CM | POA: Insufficient documentation

## 2018-05-28 DIAGNOSIS — Y939 Activity, unspecified: Secondary | ICD-10-CM | POA: Insufficient documentation

## 2018-05-28 DIAGNOSIS — Z79899 Other long term (current) drug therapy: Secondary | ICD-10-CM | POA: Insufficient documentation

## 2018-05-28 DIAGNOSIS — W109XXA Fall (on) (from) unspecified stairs and steps, initial encounter: Secondary | ICD-10-CM | POA: Insufficient documentation

## 2018-05-28 DIAGNOSIS — I10 Essential (primary) hypertension: Secondary | ICD-10-CM | POA: Insufficient documentation

## 2018-05-28 DIAGNOSIS — Y929 Unspecified place or not applicable: Secondary | ICD-10-CM | POA: Insufficient documentation

## 2018-05-28 DIAGNOSIS — Y999 Unspecified external cause status: Secondary | ICD-10-CM | POA: Insufficient documentation

## 2018-05-28 DIAGNOSIS — Z7901 Long term (current) use of anticoagulants: Secondary | ICD-10-CM | POA: Insufficient documentation

## 2018-05-28 MED ORDER — OXYCODONE-ACETAMINOPHEN 5-325 MG PO TABS: 1.0000 | ORAL_TABLET | ORAL | 0 refills | Status: DC | PRN

## 2018-05-28 MED ORDER — CLONIDINE HCL 0.3 MG PO TABS: 0.3000 mg | ORAL_TABLET | Freq: Two times a day (BID) | ORAL | 0 refills | Status: DC

## 2018-05-28 MED ORDER — METOPROLOL TARTRATE 50 MG PO TABS: 50.0000 mg | ORAL_TABLET | Freq: Two times a day (BID) | ORAL | 0 refills | Status: DC

## 2018-05-28 MED ORDER — OXYCODONE-ACETAMINOPHEN 5-325 MG PO TABS
1.0000 | ORAL_TABLET | Freq: Once | ORAL | Status: AC
  Administered 2018-05-28: 1 via ORAL
  Filled 2018-05-28: qty 1

## 2018-05-28 MED ORDER — FENTANYL CITRATE (PF) 100 MCG/2ML IJ SOLN
50.0000 ug | Freq: Once | INTRAMUSCULAR | Status: AC
  Administered 2018-05-28: 50 ug via INTRAMUSCULAR
  Filled 2018-05-28: qty 2

## 2018-05-28 MED ORDER — CLONAZEPAM 0.5 MG PO TABS
0.5000 mg | ORAL_TABLET | Freq: Once | ORAL | Status: AC
  Administered 2018-05-28: 0.5 mg via ORAL
  Filled 2018-05-28: qty 1
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
## 2018-05-28 NOTE — ED Notes (Signed)
Patient transported to CT 

## 2018-05-28 NOTE — ED Triage Notes (Signed)
Pt brought to ED via [facility]. Pt states he fell down steps 2 days ago and hit left shoulder. Pt denies hitting head or LOC. Per EMS, pt also with hx hypertension and unable to get meds filled, has been out for 2 days.

## 2018-05-28 NOTE — Discharge Instructions (Addendum)
Apply ice packs on and off to your shoulder.  Contact your orthopedic provider to arrange a follow-up appointment.  Your blood pressure today is elevated, I have refilled your medications as a courtesy.  You will need to arrange follow-up with your primary care provider for further refills.  Be sure to take your blood pressure medications daily as directed.  Return to the ER for any worsening symptoms.

## 2018-06-28 ENCOUNTER — Encounter (HOSPITAL_COMMUNITY): Payer: Self-pay | Admitting: Emergency Medicine

## 2018-06-28 ENCOUNTER — Other Ambulatory Visit: Payer: Self-pay

## 2018-06-28 ENCOUNTER — Emergency Department (HOSPITAL_COMMUNITY)
Admission: EM | Admit: 2018-06-28 | Discharge: 2018-06-28 | Disposition: A | Discharge status: Home / Self Care | Payer: Self-pay | Attending: Emergency Medicine | Admitting: Emergency Medicine

## 2018-06-28 DIAGNOSIS — I10 Essential (primary) hypertension: Secondary | ICD-10-CM | POA: Insufficient documentation

## 2018-06-28 DIAGNOSIS — Z79899 Other long term (current) drug therapy: Secondary | ICD-10-CM | POA: Insufficient documentation

## 2018-06-28 DIAGNOSIS — F17210 Nicotine dependence, cigarettes, uncomplicated: Secondary | ICD-10-CM | POA: Insufficient documentation

## 2018-06-28 HISTORY — DX: Other pulmonary embolism without acute cor pulmonale: I26.99

## 2018-06-28 MED ORDER — CLONIDINE HCL 0.3 MG PO TABS: 0.3000 mg | ORAL_TABLET | Freq: Two times a day (BID) | ORAL | 0 refills | Status: DC

## 2018-06-28 MED ORDER — CLONIDINE HCL 0.2 MG PO TABS
0.3000 mg | ORAL_TABLET | Freq: Once | ORAL | Status: AC
  Administered 2018-06-28: 0.3 mg via ORAL
  Filled 2018-06-28: qty 1

## 2018-06-28 NOTE — ED Provider Notes (Signed)
[facility] EMERGENCY DEPARTMENT Provider Note   CSN: [id number] Arrival date & time: 06/28/18  1005    History   Chief Complaint Chief Complaint  Patient presents with   Chest Pain    HPI Travis [name] is a 53 y.o. male.     HPI  The patient is a 53 year old male, he has a known history of multiple medical problems including a history of tobacco use, hypertension and some underlying psychiatric problems as well.  The patient has refused stress testing in the past, he has had intermittent chest pain and has had negative enzymes.  He has had frequent panic attacks.  He is also known to have atrial fibrillation and is currently on Xarelto as well as a beta-blocker.  He states that he has not run out of those medications however he has run out of his clonidine 3 days ago.  He has had some intermittent chest pain yesterday but has been chest pain-free today.  He has been concerned because his blood pressure last night got up to 175 systolic.  Again he has been out of his clonidine for several days.  He denies any chest pain shortness of breath fevers, chills, nausea, vomiting, swelling of the legs or any other symptoms at this time.  He is not lightheaded, not having headaches and not having any other complaints.  Past Medical History:  Diagnosis Date   Anxiety    Panic attacks   Chest pain    Hospital, March, 2014, negative enzymes, patient refused in-hospital  stress test, patient canceled outpatient stress test   Constipation    Degenerative disk disease    Degenerative disk disease    Depression    GERD (gastroesophageal reflux disease)    Hypertension    Incontinence of urine    Neuromuscular disorder (HCC)    Obesity    Polysubstance abuse (HCC)    PTSD (post-traumatic stress disorder)    Pulmonary emboli (HCC)    Seizures (HCC)    Shortness of breath dyspnea    with exertion   Spinal stenosis    Spinal stenosis    Suicide attempt (HCC)     Tachycardia - pulse    Tobacco abuse     Patient Active Problem List   Diagnosis Date Noted   Pulmonary embolism (HCC) 04/17/2018   Bradycardia, drug induced 03/01/2018   Hyperglycemia 03/01/2018   Clonidine overdose 03/01/2018   ARF (acute renal failure) (HCC) 09/01/2015   Hypotension 09/01/2015   Acute encephalopathy 09/01/2015   Hematochezia 04/07/2014   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   Major depressive disorder, single episode, moderate (HCC) 10/12/2013   Morbid obesity (HCC) 10/12/2013   Essential hypertension 10/12/2013   Chronic pain syndrome 10/12/2013   MDD (major depressive disorder), recurrent episode, severe (HCC) 09/29/2013   Rectal pain 08/10/2013   Tachycardia 07/27/2013   Urine incontinence 07/27/2013   Rectal bleeding 07/27/2013   Left arm pain 03/29/2013   Lumbar radicular pain 03/23/2013   Bilateral shoulder pain 10/27/2012   Smoking 10/27/2012   Dental abscess 07/14/2012   Hypertension    Anxiety    Spinal stenosis    Tobacco abuse    Obesity    Dyslipidemia    Polysubstance abuse (HCC)    Chest pain    Grief 06/11/2012   Panic attack 01/21/2012   HYPERCHOLESTEROLEMIA 02/18/2006   DEPRESSION 02/18/2006   GERD 02/18/2006   HEADACHE 02/18/2006    Past Surgical History:  Procedure Laterality Date   COLONOSCOPY N/A 08/24/2014  Procedure: COLONOSCOPY;  Surgeon: Charlott Rakes, MD;  Location: [facility] ENDOSCOPY;  Service: Endoscopy;  Laterality: N/A;   ESOPHAGOGASTRODUODENOSCOPY (EGD) WITH PROPOFOL N/A 08/24/2014   Procedure: ESOPHAGOGASTRODUODENOSCOPY (EGD) WITH PROPOFOL;  Surgeon: Charlott Rakes, MD;  Location: [facility] ENDOSCOPY;  Service: Endoscopy;  Laterality: N/A;   WISDOM TOOTH EXTRACTION          Home Medications    Prior to Admission medications   Medication Sig Start Date End Date Taking? Authorizing Provider  acetaminophen (TYLENOL) 500 MG tablet Take 500 mg by mouth every 6 (six) hours as  needed for mild pain or moderate pain.    [provider]  apixaban (ELIQUIS) 5 MG TABS tablet Take 2 tablets (10 mg total) by mouth 2 (two) times daily. Then 1 tablet (5 mg) two times daily starting 04/26/18 Patient taking differently: Take 5 mg by mouth 2 (two) times daily.  04/20/18   Catarina Hartshorn, MD  cloNIDine (CATAPRES) 0.3 MG tablet Take 1 tablet (0.3 mg total) by mouth 2 (two) times daily. 06/28/18   Eber Hong, MD  esomeprazole (NEXIUM) 40 MG capsule Take 40 mg by mouth daily at 12 noon.    [provider]  metoprolol tartrate (LOPRESSOR) 50 MG tablet Take 1 tablet (50 mg total) by mouth 2 (two) times daily. 04/20/18   Catarina Hartshorn, MD  metoprolol tartrate (LOPRESSOR) 50 MG tablet Take 1 tablet (50 mg total) by mouth 2 (two) times daily. 05/28/18   Triplett, Tammy, PA-C  oxyCODONE-acetaminophen (PERCOCET/ROXICET) 5-325 MG tablet Take 1 tablet by mouth every 4 (four) hours as needed. 05/28/18   Pauline Aus, PA-C    Family History Family History  Problem Relation Age of Onset   Stroke Mother    Hypertension Mother    Hyperlipidemia Mother    Stroke Father    Hypertension Father    Dementia Father    Diabetes Father    Heart disease Father    Hyperlipidemia Father    Cancer Sister        brain   Diabetes Sister    Heart disease Sister    Hyperlipidemia Sister    Hypertension Sister    Stroke Sister    Heart disease Brother    Hyperlipidemia Brother     Social History Social History   Tobacco Use   Smoking status: Current Every Day Smoker    Packs/day: 0.50    Years: 0.00    Pack years: 0.00    Types: Cigarettes    Last attempt to quit: 12/12/2016    Years since quitting: 1.5   Smokeless tobacco: Never Used  Substance Use Topics   Alcohol use: No   Drug use: No     Allergies   Blueberry flavor; Penicillins; Robaxin [methocarbamol]; and Toradol [ketorolac tromethamine]   Review of Systems Review of Systems  All other  systems reviewed and are negative.    Physical Exam Updated Vital Signs BP (!) 186/112 (BP Location: Right Arm)    Pulse 66    Temp 98 F (36.7 C) (Oral)    Resp 17    Ht 1.803 m ( )    Wt 112.5 kg    SpO2 100%    BMI 34.59 kg/m   Physical Exam Vitals signs and nursing note reviewed.  Constitutional:      General: He is not in acute distress.    Appearance: He is well-developed.  HENT:     Head: Normocephalic and atraumatic.     Mouth/Throat:  Pharynx: No oropharyngeal exudate.  Eyes:     General: No scleral icterus.       Right eye: No discharge.        Left eye: No discharge.     Conjunctiva/sclera: Conjunctivae normal.     Pupils: Pupils are equal, round, and reactive to light.  Neck:     Musculoskeletal: Normal range of motion and neck supple.     Thyroid: No thyromegaly.     Vascular: No JVD.  Cardiovascular:     Rate and Rhythm: Normal rate and regular rhythm.     Heart sounds: Normal heart sounds. No murmur. No friction rub. No gallop.   Pulmonary:     Effort: Pulmonary effort is normal. No respiratory distress.     Breath sounds: Normal breath sounds. No wheezing or rales.  Abdominal:     General: Bowel sounds are normal. There is no distension.     Palpations: Abdomen is soft. There is no mass.     Tenderness: There is no abdominal tenderness.  Musculoskeletal: Normal range of motion.        General: No tenderness.  Lymphadenopathy:     Cervical: No cervical adenopathy.  Skin:    General: Skin is warm and dry.     Findings: No erythema or rash.  Neurological:     Mental Status: He is alert.     Coordination: Coordination normal.     Comments: Normal speech, coordination, strength and sensation.  He has normal facial symmetry and normal memory and level of alertness  Psychiatric:        Behavior: Behavior normal.      ED Treatments / Results  Labs (all labs ordered are listed, but only abnormal results are displayed) Labs Reviewed - No data  to display  EKG EKG Interpretation  Date/Time:  Sunday June 28 2018 10:10:37 EDT Ventricular Rate:  62 PR Interval:    QRS Duration: 101 QT Interval:  440 QTC Calculation: 447 R Axis:   12 Text Interpretation:  Sinus rhythm Normal ECG since last tracing no significant change Confirmed by Eber Hong ([id number]) on 06/28/2018 10:21:36 AM   Radiology No results found.  Procedures Procedures (including critical care time)  Medications Ordered in ED Medications  cloNIDine (CATAPRES) tablet 0.3 mg (has no administration in time range)     Initial Impression / Assessment and Plan / ED Course  I have reviewed the triage vital signs and the nursing notes.  Pertinent labs & imaging results that were available during my care of the patient were reviewed by me and considered in my medical decision making (see chart for details).       The patient's exam is thankfully unremarkable, his only abnormal finding is that his blood pressure was elevated at 186/112.  That being said he is out of his clonidine which will need to be refilled.  I am happy to do that, he is taking his metoprolol and states that he has adequate at home as well as his Eliquis which he states he gets for free.  I do not see any reason to do any further testing at this time.  With a normal EKG and being chest pain-free I doubt that he has any cardiac underlying disease causing his symptoms.  This is more likely related to his withdrawal of clonidine medication.  He is not anxious, depressed or suicidal and is otherwise feeling well  Final Clinical Impressions(s) / ED Diagnoses   Final diagnoses:  Essential  hypertension    ED Discharge Orders         Ordered    cloNIDine (CATAPRES) 0.3 MG tablet  2 times daily     06/28/18 1024           Eber Hong, MD 06/28/18 1025

## 2018-06-28 NOTE — Discharge Instructions (Signed)
Please return to the emergency department if you should develop increasing or worsening symptoms including increasing chest pain, shortness of breath, swelling of the legs or any other severe or worsening symptoms.  We have given you a dose of your clonidine and have written you a prescription for another 3 months worth of your medication.  You will need to follow-up with your doctor to make sure that you have ongoing refills and I would recommend a recheck in 1 week at your doctor's office.

## 2018-06-28 NOTE — ED Triage Notes (Signed)
Pt reports he has been out of his clonidine for several days, has been having some increased hypertension and tachycardia.  Began having intermittent chest pain last night around 9pm lasting about 15 minutes at a time.  HR was up to 170 last night and took 2 metoprolol.  This morning took 2 full strength aspirin (thought they were 81mg ).  Also diaphoretic on arrival.

## 2018-07-29 ENCOUNTER — Emergency Department (HOSPITAL_COMMUNITY)
Admission: EM | Admit: 2018-07-29 | Discharge: 2018-07-29 | Disposition: A | Discharge status: Home / Self Care | Payer: Self-pay | Attending: Emergency Medicine | Admitting: Emergency Medicine

## 2018-07-29 ENCOUNTER — Other Ambulatory Visit: Payer: Self-pay

## 2018-07-29 ENCOUNTER — Encounter (HOSPITAL_COMMUNITY): Payer: Self-pay | Admitting: Emergency Medicine

## 2018-07-29 DIAGNOSIS — F419 Anxiety disorder, unspecified: Secondary | ICD-10-CM | POA: Insufficient documentation

## 2018-07-29 DIAGNOSIS — I10 Essential (primary) hypertension: Secondary | ICD-10-CM | POA: Insufficient documentation

## 2018-07-29 DIAGNOSIS — Z760 Encounter for issue of repeat prescription: Secondary | ICD-10-CM | POA: Insufficient documentation

## 2018-07-29 DIAGNOSIS — Z79899 Other long term (current) drug therapy: Secondary | ICD-10-CM | POA: Insufficient documentation

## 2018-07-29 DIAGNOSIS — F17210 Nicotine dependence, cigarettes, uncomplicated: Secondary | ICD-10-CM | POA: Insufficient documentation

## 2018-07-29 DIAGNOSIS — R739 Hyperglycemia, unspecified: Secondary | ICD-10-CM | POA: Insufficient documentation

## 2018-07-29 LAB — CBC WITH DIFFERENTIAL/PLATELET
Abs Immature Granulocytes: 0.02 10*3/uL (ref 0.00–0.07)
Basophils Absolute: 0.1 10*3/uL (ref 0.0–0.1)
Basophils Relative: 1 %
Eosinophils Absolute: 0.1 10*3/uL (ref 0.0–0.5)
Eosinophils Relative: 1 %
HCT: 49.4 % (ref 39.0–52.0)
Hemoglobin: 15.9 g/dL (ref 13.0–17.0)
Immature Granulocytes: 0 %
Lymphocytes Relative: 27 %
Lymphs Abs: 2.8 10*3/uL (ref 0.7–4.0)
MCH: 28.5 pg (ref 26.0–34.0)
MCHC: 32.2 g/dL (ref 30.0–36.0)
MCV: 88.7 fL (ref 80.0–100.0)
Monocytes Absolute: 0.9 10*3/uL (ref 0.1–1.0)
Monocytes Relative: 9 %
Neutro Abs: 6.3 10*3/uL (ref 1.7–7.7)
Neutrophils Relative %: 62 %
Platelets: 195 10*3/uL (ref 150–400)
RBC: 5.57 MIL/uL (ref 4.22–5.81)
RDW: 13.6 % (ref 11.5–15.5)
WBC: 10.3 10*3/uL (ref 4.0–10.5)
nRBC: 0 % (ref 0.0–0.2)

## 2018-07-29 LAB — BASIC METABOLIC PANEL
Anion gap: 12 (ref 5–15)
BUN: 15 mg/dL (ref 6–20)
CO2: 25 mmol/L (ref 22–32)
Calcium: 9.3 mg/dL (ref 8.9–10.3)
Chloride: 103 mmol/L (ref 98–111)
Creatinine, Ser: 1.15 mg/dL (ref 0.61–1.24)
GFR calc Af Amer: >60 mL/min (ref >60)
GFR calc non Af Amer: >60 mL/min (ref >60)
Glucose, Bld: 183 mg/dL — ABNORMAL HIGH (ref 70–99)
Potassium: 3.5 mmol/L (ref 3.5–5.1)
Sodium: 140 mmol/L (ref 135–145)

## 2018-07-29 LAB — TROPONIN I: Troponin I: <0.03 ng/mL (ref <0.03)

## 2018-07-29 MED ORDER — GABAPENTIN 300 MG PO CAPS: 300.0000 mg | ORAL_CAPSULE | Freq: Two times a day (BID) | ORAL | 0 refills | Status: DC

## 2018-07-29 MED ORDER — METOPROLOL TARTRATE 50 MG PO TABS: 50.0000 mg | ORAL_TABLET | Freq: Two times a day (BID) | ORAL | 0 refills | Status: DC

## 2018-07-29 MED ORDER — CLONIDINE HCL 0.2 MG PO TABS
0.3000 mg | ORAL_TABLET | Freq: Once | ORAL | Status: AC
  Administered 2018-07-29: 0.3 mg via ORAL
  Filled 2018-07-29: qty 1

## 2018-07-29 MED ORDER — QUETIAPINE FUMARATE 25 MG PO TABS
ORAL_TABLET | ORAL | Status: AC
  Filled 2018-07-29: qty 3

## 2018-07-29 MED ORDER — QUETIAPINE FUMARATE 50 MG PO TABS: 50.0000 mg | ORAL_TABLET | Freq: Every day | ORAL | 0 refills | Status: DC

## 2018-07-29 MED ORDER — QUETIAPINE FUMARATE ER 50 MG PO TB24
50.0000 mg | ORAL_TABLET | Freq: Once | ORAL | Status: DC
  Filled 2018-07-29: qty 1

## 2018-07-29 MED ORDER — GABAPENTIN 300 MG PO CAPS
300.0000 mg | ORAL_CAPSULE | Freq: Once | ORAL | Status: AC
  Administered 2018-07-29: 300 mg via ORAL
  Filled 2018-07-29: qty 1

## 2018-07-29 MED ORDER — CLONIDINE HCL 0.3 MG PO TABS: 0.3000 mg | ORAL_TABLET | Freq: Two times a day (BID) | ORAL | 0 refills | Status: DC
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
## 2018-08-10 ENCOUNTER — Emergency Department (HOSPITAL_COMMUNITY): Payer: Self-pay

## 2018-08-10 ENCOUNTER — Other Ambulatory Visit: Payer: Self-pay

## 2018-08-10 ENCOUNTER — Emergency Department (HOSPITAL_COMMUNITY)
Admission: EM | Admit: 2018-08-10 | Discharge: 2018-08-10 | Disposition: A | Discharge status: Home / Self Care | Payer: Self-pay | Attending: Emergency Medicine | Admitting: Emergency Medicine

## 2018-08-10 ENCOUNTER — Encounter (HOSPITAL_COMMUNITY): Payer: Self-pay | Admitting: Emergency Medicine

## 2018-08-10 DIAGNOSIS — R42 Dizziness and giddiness: Secondary | ICD-10-CM | POA: Insufficient documentation

## 2018-08-10 DIAGNOSIS — Z5321 Procedure and treatment not carried out due to patient leaving prior to being seen by health care provider: Secondary | ICD-10-CM | POA: Insufficient documentation

## 2018-08-10 MED ORDER — SODIUM CHLORIDE 0.9% FLUSH: 3.0000 mL | Freq: Once | INTRAVENOUS | Status: DC

## 2018-08-10 NOTE — ED Notes (Signed)
Pt c/o dizziness, more sob and continued chest pain. Pt's vital were retaken.

## 2018-08-10 NOTE — ED Notes (Signed)
Pt took his iv out in bathroom and stated he was going to [facility].

## 2018-08-10 NOTE — ED Triage Notes (Signed)
Pt from home via [facility]. Pt originally called EMS due to muscle cramping. Pt informed this nurse he is now started to have chest pain that began 5 minutes ago. Pt reports diaphoresis X 2 days.

## 2018-08-10 NOTE — ED Notes (Signed)
Pt upset and cursing stating he needs a bed to lay in. I informed pt of wait time and told him to continue to wait in waiting room.

## 2018-08-11 ENCOUNTER — Emergency Department (HOSPITAL_COMMUNITY)
Admission: EM | Admit: 2018-08-11 | Discharge: 2018-08-11 | Disposition: A | Discharge status: Home / Self Care | Payer: Self-pay | Attending: Emergency Medicine | Admitting: Emergency Medicine

## 2018-08-11 ENCOUNTER — Other Ambulatory Visit: Payer: Self-pay

## 2018-08-11 ENCOUNTER — Encounter (HOSPITAL_COMMUNITY): Payer: Self-pay

## 2018-08-11 DIAGNOSIS — F17210 Nicotine dependence, cigarettes, uncomplicated: Secondary | ICD-10-CM | POA: Insufficient documentation

## 2018-08-11 DIAGNOSIS — I10 Essential (primary) hypertension: Secondary | ICD-10-CM | POA: Insufficient documentation

## 2018-08-11 DIAGNOSIS — R569 Unspecified convulsions: Secondary | ICD-10-CM | POA: Insufficient documentation

## 2018-08-11 DIAGNOSIS — E876 Hypokalemia: Secondary | ICD-10-CM | POA: Insufficient documentation

## 2018-08-11 DIAGNOSIS — Z7901 Long term (current) use of anticoagulants: Secondary | ICD-10-CM | POA: Insufficient documentation

## 2018-08-11 DIAGNOSIS — Z79899 Other long term (current) drug therapy: Secondary | ICD-10-CM | POA: Insufficient documentation

## 2018-08-11 LAB — CBC WITH DIFFERENTIAL/PLATELET
Abs Immature Granulocytes: 0.03 10*3/uL (ref 0.00–0.07)
Basophils Absolute: 0.1 10*3/uL (ref 0.0–0.1)
Basophils Relative: 1 %
Eosinophils Absolute: 0.2 10*3/uL (ref 0.0–0.5)
Eosinophils Relative: 2 %
HCT: 49.9 % (ref 39.0–52.0)
Hemoglobin: 15.9 g/dL (ref 13.0–17.0)
Immature Granulocytes: 0 %
Lymphocytes Relative: 30 %
Lymphs Abs: 2.9 10*3/uL (ref 0.7–4.0)
MCH: 28.4 pg (ref 26.0–34.0)
MCHC: 31.9 g/dL (ref 30.0–36.0)
MCV: 89.3 fL (ref 80.0–100.0)
Monocytes Absolute: 0.7 10*3/uL (ref 0.1–1.0)
Monocytes Relative: 7 %
Neutro Abs: 5.7 10*3/uL (ref 1.7–7.7)
Neutrophils Relative %: 60 %
Platelets: 208 10*3/uL (ref 150–400)
RBC: 5.59 MIL/uL (ref 4.22–5.81)
RDW: 13.1 % (ref 11.5–15.5)
WBC: 9.7 10*3/uL (ref 4.0–10.5)
nRBC: 0 % (ref 0.0–0.2)

## 2018-08-11 LAB — COMPREHENSIVE METABOLIC PANEL
ALT: 19 U/L (ref 0–44)
AST: 22 U/L (ref 15–41)
Albumin: 3.9 g/dL (ref 3.5–5.0)
Alkaline Phosphatase: 104 U/L (ref 38–126)
Anion gap: 13 (ref 5–15)
BUN: 8 mg/dL (ref 6–20)
CO2: 26 mmol/L (ref 22–32)
Calcium: 8.5 mg/dL — ABNORMAL LOW (ref 8.9–10.3)
Chloride: 102 mmol/L (ref 98–111)
Creatinine, Ser: 0.97 mg/dL (ref 0.61–1.24)
GFR calc Af Amer: >60 mL/min (ref >60)
GFR calc non Af Amer: >60 mL/min (ref >60)
Glucose, Bld: 192 mg/dL — ABNORMAL HIGH (ref 70–99)
Potassium: 2.7 mmol/L — CL (ref 3.5–5.1)
Sodium: 141 mmol/L (ref 135–145)
Total Bilirubin: 0.8 mg/dL (ref 0.3–1.2)
Total Protein: 8.2 g/dL — ABNORMAL HIGH (ref 6.5–8.1)

## 2018-08-11 LAB — URINALYSIS, ROUTINE W REFLEX MICROSCOPIC
Bilirubin Urine: NEGATIVE
Glucose, UA: 50 mg/dL — AB
Ketones, ur: NEGATIVE mg/dL
Leukocytes,Ua: NEGATIVE
Nitrite: NEGATIVE
Protein, ur: 30 mg/dL — AB
Specific Gravity, Urine: 1.025 (ref 1.005–1.030)
pH: 6 (ref 5.0–8.0)

## 2018-08-11 LAB — CK: Total CK: 265 U/L (ref 49–397)

## 2018-08-11 MED ORDER — CLONIDINE HCL 0.2 MG PO TABS
0.3000 mg | ORAL_TABLET | Freq: Once | ORAL | Status: AC
  Administered 2018-08-11: 0.3 mg via ORAL
  Filled 2018-08-11: qty 1

## 2018-08-11 MED ORDER — POTASSIUM CHLORIDE CRYS ER 20 MEQ PO TBCR
40.0000 meq | EXTENDED_RELEASE_TABLET | Freq: Once | ORAL | Status: AC
  Administered 2018-08-11: 40 meq via ORAL
  Filled 2018-08-11: qty 2

## 2018-08-11 MED ORDER — SODIUM CHLORIDE 0.9 % IV BOLUS
1000.0000 mL | Freq: Once | INTRAVENOUS | Status: AC
  Administered 2018-08-11: 1000 mL via INTRAVENOUS

## 2018-08-11 MED ORDER — METOPROLOL TARTRATE 50 MG PO TABS
50.0000 mg | ORAL_TABLET | Freq: Once | ORAL | Status: AC
  Administered 2018-08-11: 50 mg via ORAL
  Filled 2018-08-11: qty 1

## 2018-08-11 MED ORDER — METOPROLOL TARTRATE 50 MG PO TABS: 50.0000 mg | ORAL_TABLET | Freq: Two times a day (BID) | ORAL | 0 refills | Status: DC

## 2018-08-11 MED ORDER — CLONIDINE HCL 0.3 MG PO TABS: 0.3000 mg | ORAL_TABLET | Freq: Two times a day (BID) | ORAL | 0 refills | Status: DC

## 2018-08-11 MED ORDER — POTASSIUM CHLORIDE 10 MEQ/100ML IV SOLN
10.0000 meq | INTRAVENOUS | Status: AC
  Administered 2018-08-11 (×2): 10 meq via INTRAVENOUS
  Filled 2018-08-11 (×2): qty 100

## 2018-08-11 MED ORDER — OXYCODONE-ACETAMINOPHEN 5-325 MG PO TABS
1.0000 | ORAL_TABLET | Freq: Once | ORAL | Status: AC
  Administered 2018-08-11: 11:00:00 1 via ORAL
  Filled 2018-08-11: qty 1

## 2018-08-11 NOTE — ED Triage Notes (Signed)
Pt reports that he was in ED last night for leg pain/cramps but left due to volume. Pt walked in with EMS due to seizures . Pt reports he had a seizure last night at 8pm and then 2am. Seizures unwitnessed. States he just wakes up. States seizures are non epileptic, has diminished brain capacity.

## 2018-08-11 NOTE — Discharge Instructions (Addendum)
Return here as needed. Follow up with your doctor for a recheck. °

## 2018-08-11 NOTE — ED Provider Notes (Signed)
[name] Provider Note   CSN: [id number] Arrival date & time: 08/11/18  [id number]    History   Chief Complaint Chief Complaint  Patient presents with  . Seizures    HPI [name] is a 53 y.o. male.     HPI Patient presents to the emergency department with leg pain and cramping started yesterday evening but got worse today.  He states he left last night the ER because of the wait time..  The patient states he did have what he thinks is a seizure last night.  Patient states that nothing seems make the condition better or worse.  Patient states that he deals with this once or twice a year.  The patient denies chest pain, shortness of breath, headache,blurred vision, neck pain, fever, cough, weakness, numbness, dizziness, anorexia, edema, abdominal pain, nausea, vomiting, diarrhea, rash, back pain, dysuria, hematemesis, bloody stool, near syncope, or syncope. Past Medical History:  Diagnosis Date  . Anxiety    Panic attacks  . Chest pain    Hospital, March, 2014, negative enzymes, patient refused in-hospital  stress test, patient canceled outpatient stress test  . Constipation   . Degenerative disk disease   . Degenerative disk disease   . Depression   . GERD (gastroesophageal reflux disease)   . Hypertension   . Incontinence of urine   . Neuromuscular disorder (HCC)   . Obesity   . Polysubstance abuse (HCC)   . PTSD (post-traumatic stress disorder)   . Pulmonary emboli (HCC)   . Seizures (HCC)   . Shortness of breath dyspnea    with exertion  . Spinal stenosis   . Spinal stenosis   . Suicide attempt (HCC)   . Tachycardia - pulse   . Tobacco abuse     Patient Active Problem List   Diagnosis Date Noted  . Pulmonary embolism (HCC) 04/17/2018  . Bradycardia, drug induced 03/01/2018  . Hyperglycemia 03/01/2018  . Clonidine overdose 03/01/2018  . ARF (acute renal failure) (HCC) 09/01/2015  . Hypotension 09/01/2015  . Acute encephalopathy  09/01/2015  . Hematochezia 04/07/2014  . 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 Major depressive disorder, single episode, moderate (HCC) 10/12/2013  . Morbid obesity (HCC) 10/12/2013  . Essential hypertension 10/12/2013  . Chronic pain syndrome 10/12/2013  . MDD (major depressive disorder), recurrent episode, severe (HCC) 09/29/2013  . Rectal pain 08/10/2013  . Tachycardia 07/27/2013  . Urine incontinence 07/27/2013  . Rectal bleeding 07/27/2013  . Left arm pain 03/29/2013  . Lumbar radicular pain 03/23/2013  . Bilateral shoulder pain 10/27/2012  . Smoking 10/27/2012  . Dental abscess 07/14/2012  . Hypertension   . Anxiety   . Spinal stenosis   . Tobacco abuse   . Obesity   . Dyslipidemia   . Polysubstance abuse (HCC)   . Chest pain   . Grief 06/11/2012  . Panic attack 01/21/2012  . HYPERCHOLESTEROLEMIA 02/18/2006  . DEPRESSION 02/18/2006  . GERD 02/18/2006  . HEADACHE 02/18/2006    Past Surgical History:  Procedure Laterality Date  . COLONOSCOPY N/A 08/24/2014   Procedure: COLONOSCOPY;  Surgeon: Charlott RakesVincent Schooler, MD;  [facility];  Service: Endoscopy;  Laterality: N/A;  . ESOPHAGOGASTRODUODENOSCOPY (EGD) WITH PROPOFOL N/A 08/24/2014   Procedure: ESOPHAGOGASTRODUODENOSCOPY (EGD) WITH PROPOFOL;  Surgeon: Charlott RakesVincent Schooler, MD;  [facility];  Service: Endoscopy;  Laterality: N/A;  . WISDOM TOOTH EXTRACTION          Home Medications    Prior to Admission medications   Medication  Sig Start Date End Date Taking? Authorizing Provider  acetaminophen (TYLENOL) 500 MG tablet Take 500 mg by mouth every 6 (six) hours as needed for mild pain or moderate pain.    [provider]  apixaban (ELIQUIS) 5 MG TABS tablet Take 2 tablets (10 mg total) by mouth 2 (two) times daily. Then 1 tablet (5 mg) two times daily starting 04/26/18 Patient taking differently: Take 5 mg by mouth 2 (two) times daily.  04/20/18   Catarina Hartshornat, David, MD  cloNIDine (CATAPRES) 0.3 MG  tablet Take 1 tablet (0.3 mg total) by mouth 2 (two) times daily. 07/29/18   Jacalyn LefevreHaviland, Julie, MD  esomeprazole (NEXIUM) 40 MG capsule Take 40 mg by mouth daily at 12 noon.    [provider]  gabapentin (NEURONTIN) 300 MG capsule Take 1 capsule (300 mg total) by mouth 2 (two) times daily. 07/29/18   Jacalyn LefevreHaviland, Julie, MD  metoprolol tartrate (LOPRESSOR) 50 MG tablet Take 1 tablet (50 mg total) by mouth 2 (two) times daily. 05/28/18   Triplett, Tammy, PA-C  metoprolol tartrate (LOPRESSOR) 50 MG tablet Take 1 tablet (50 mg total) by mouth 2 (two) times daily. 07/29/18   Jacalyn LefevreHaviland, Julie, MD  oxyCODONE-acetaminophen (PERCOCET/ROXICET) 5-325 MG tablet Take 1 tablet by mouth every 4 (four) hours as needed. 05/28/18   Triplett, Tammy, PA-C  QUEtiapine (SEROQUEL) 50 MG tablet Take 1 tablet (50 mg total) by mouth at bedtime. 07/29/18   Jacalyn LefevreHaviland, Julie, MD    Family History Family History  Problem Relation Age of Onset  . Stroke Mother   . Hypertension Mother   . Hyperlipidemia Mother   . Stroke Father   . Hypertension Father   . Dementia Father   . Diabetes Father   . Heart disease Father   . Hyperlipidemia Father   . Cancer Sister        brain  . Diabetes Sister   . Heart disease Sister   . Hyperlipidemia Sister   . Hypertension Sister   . Stroke Sister   . Heart disease Brother   . Hyperlipidemia Brother     Social History Social History   Tobacco Use  . Smoking status: Current Every Day Smoker    Packs/day: 0.50    Years: 0.00    Pack years: 0.00    Types: Cigarettes    Last attempt to quit: 12/12/2016    Years since quitting: 1.6  . Smokeless tobacco: Never Used  Substance Use Topics  . Alcohol use: No  . Drug use: No     Allergies   Blueberry flavor; Penicillins; Robaxin [methocarbamol]; and Toradol [ketorolac tromethamine]   Review of Systems Review of Systems All other systems negative except as documented in the HPI. All pertinent positives and negatives as  reviewed in the HPI.  Physical Exam Updated Vital Signs BP (!) 172/134   Pulse (!) 120   Temp 98.9 F (37.2 C) (Oral)   Resp (!) 24   Ht 5\' 10"  (1.778 m)   Wt 114 kg   SpO2 96%   BMI 36.06 kg/m   Physical Exam Vitals signs and nursing note reviewed.  Constitutional:      General: He is not in acute distress.    Appearance: He is well-developed.  HENT:     Head: Normocephalic and atraumatic.  Eyes:     Pupils: Pupils are equal, round, and reactive to light.  Neck:     Musculoskeletal: Normal range of motion and neck supple.  Cardiovascular:  Rate and Rhythm: Normal rate and regular rhythm.     Heart sounds: Normal heart sounds. No murmur. No friction rub. No gallop.   Pulmonary:     Effort: Pulmonary effort is normal. No respiratory distress.     Breath sounds: Normal breath sounds. No wheezing.  Abdominal:     General: Bowel sounds are normal. There is no distension.     Palpations: Abdomen is soft.     Tenderness: There is no abdominal tenderness.  Skin:    General: Skin is warm and dry.     Capillary Refill: Capillary refill takes less than 2 seconds.     Findings: No erythema or rash.  Neurological:     Mental Status: He is alert and oriented to person, place, and time.     Motor: No abnormal muscle tone.     Coordination: Coordination normal.  Psychiatric:        Behavior: Behavior normal.      ED Treatments / Results  Labs (all labs ordered are listed, but only abnormal results are displayed) Labs Reviewed  COMPREHENSIVE METABOLIC PANEL - Abnormal; Notable for the following components:      Result Value   Potassium 2.7 (*)    Glucose, Bld 192 (*)    Calcium 8.5 (*)    Total Protein 8.2 (*)    All other components within normal limits  URINALYSIS, ROUTINE W REFLEX MICROSCOPIC - Abnormal; Notable for the following components:   Glucose, UA 50 (*)    Hgb urine dipstick MODERATE (*)    Protein, ur 30 (*)    Bacteria, UA RARE (*)    All other  components within normal limits  CBC WITH DIFFERENTIAL/PLATELET  CK    EKG None  Radiology Dg Chest 2 View  Result Date: 08/10/2018 CLINICAL DATA:  Muscle cramping.  Chest pain. EXAM: CHEST - 2 VIEW COMPARISON:  CT scan April 28, 2018 FINDINGS: Healing right rib fractures noted. No pneumothorax. The cardiomediastinal silhouette is normal. Mild atelectasis in the left base. No suspicious infiltrates. IMPRESSION: Ewing right rib fractures.  No acute abnormalities. Electronically Signed   By: Dorise Bullion III M.D   On: 08/10/2018 17:17    Procedures Procedures (including critical care time)  Medications Ordered in ED Medications  potassium chloride 10 mEq in 100 mL IVPB (has no administration in time range)  sodium chloride 0.9 % bolus 1,000 mL (0 mLs Intravenous Stopped 08/11/18 1051)  oxyCODONE-acetaminophen (PERCOCET/ROXICET) 5-325 MG per tablet 1 tablet (1 tablet Oral Given 08/11/18 1048)  potassium chloride SA (K-DUR) CR tablet 40 mEq (40 mEq Oral Given 08/11/18 1046)     Initial Impression / Assessment and Plan / ED Course  I have reviewed the triage vital signs and the nursing notes.  Pertinent labs & imaging results that were available during my care of the patient were reviewed by me and considered in my medical decision making (see chart for details).       Patient has been given IV fluids along with potassium.  The patient's heart rate has improved to the low 100s upper 90s.  I will advised the patient to return here as needed.  I advised him he will need to follow-up with his primary doctor.  Will refill his medications as well.  Final Clinical Impressions(s) / ED Diagnoses   Final diagnoses:  None    ED Discharge Orders    None       Dalia Heading, PA-C 08/14/18 1457  Samuel JesterMcManus, Kathleen, DO 08/14/18 1658

## 2018-08-11 NOTE — ED Notes (Signed)
Provider notified of K 2.7

## 2018-08-15 ENCOUNTER — Emergency Department (HOSPITAL_COMMUNITY)
Admission: EM | Admit: 2018-08-15 | Discharge: 2018-08-15 | Disposition: A | Discharge status: Home / Self Care | Payer: Self-pay | Attending: Emergency Medicine | Admitting: Emergency Medicine

## 2018-08-15 ENCOUNTER — Other Ambulatory Visit: Payer: Self-pay

## 2018-08-15 ENCOUNTER — Emergency Department (HOSPITAL_COMMUNITY): Payer: Self-pay

## 2018-08-15 ENCOUNTER — Encounter (HOSPITAL_COMMUNITY): Payer: Self-pay | Admitting: Emergency Medicine

## 2018-08-15 DIAGNOSIS — W108XXA Fall (on) (from) other stairs and steps, initial encounter: Secondary | ICD-10-CM | POA: Insufficient documentation

## 2018-08-15 DIAGNOSIS — Z7901 Long term (current) use of anticoagulants: Secondary | ICD-10-CM | POA: Insufficient documentation

## 2018-08-15 DIAGNOSIS — Y939 Activity, unspecified: Secondary | ICD-10-CM | POA: Insufficient documentation

## 2018-08-15 DIAGNOSIS — Y929 Unspecified place or not applicable: Secondary | ICD-10-CM | POA: Insufficient documentation

## 2018-08-15 DIAGNOSIS — S40012A Contusion of left shoulder, initial encounter: Secondary | ICD-10-CM | POA: Insufficient documentation

## 2018-08-15 DIAGNOSIS — Z79899 Other long term (current) drug therapy: Secondary | ICD-10-CM | POA: Insufficient documentation

## 2018-08-15 DIAGNOSIS — Y999 Unspecified external cause status: Secondary | ICD-10-CM | POA: Insufficient documentation

## 2018-08-15 DIAGNOSIS — F17210 Nicotine dependence, cigarettes, uncomplicated: Secondary | ICD-10-CM | POA: Insufficient documentation

## 2018-08-15 DIAGNOSIS — I10 Essential (primary) hypertension: Secondary | ICD-10-CM | POA: Insufficient documentation

## 2018-08-15 MED ORDER — CLONIDINE HCL 0.3 MG PO TABS: 0.3000 mg | ORAL_TABLET | Freq: Two times a day (BID) | ORAL | 1 refills | Status: DC

## 2018-08-15 MED ORDER — LIDOCAINE 5 % EX PTCH: 1.0000 | MEDICATED_PATCH | CUTANEOUS | 0 refills | Status: DC

## 2018-08-15 MED ORDER — ACETAMINOPHEN 500 MG PO TABS
1000.0000 mg | ORAL_TABLET | Freq: Once | ORAL | Status: AC
  Administered 2018-08-15: 1000 mg via ORAL
  Filled 2018-08-15: qty 2

## 2018-08-15 NOTE — ED Provider Notes (Signed)
[facility] EMERGENCY DEPARTMENT Provider Note   CSN: [id number] Arrival date & time: 08/15/18  1217    History   Chief Complaint Chief Complaint  Patient presents with  . Fall    HPI Travis [name] is a 53 y.o. male.     HPI  The patient is a 53 year old male, he presents to the hospital by ambulance transport after falling down some steps per his report.  He landed directly on his left shoulder.  He reports that the pain is located directly laterally, posteriorly, and causes decreased range of motion.  He reports a prior injury to his rotator cuff and when I reviewed the medical record it is clear that the patient has been seen before for injury to the same shoulder by the same mechanism.  The visit from May 28, 2018 outlines essentially the exact same mechanism that occurred today.  He has been seen in the emergency department several days ago, a week before that, a month before that, and essentially has monthly if not bimonthly visits for pain complaints.  Some of these are more serious than others including a pulmonary embolism, chest wall injury, high blood pressure.  Ultimately the patient's only complaint today is the left shoulder.  This occurred 1 hour ago, it was acute in onset, he denies any other injuries including head injury, neck pain, back pain and has no numbness or weakness.  He has been able to ambulate  Past Medical History:  Diagnosis Date  . Anxiety    Panic attacks  . Chest pain    Hospital, March, 2014, negative enzymes, patient refused in-hospital  stress test, patient canceled outpatient stress test  . Constipation   . Degenerative disk disease   . Degenerative disk disease   . Depression   . GERD (gastroesophageal reflux disease)   . Hypertension   . Incontinence of urine   . Neuromuscular disorder ([place])   . Obesity   . Polysubstance abuse ([place])   . PTSD (post-traumatic stress disorder)   . Pulmonary emboli ([place])   . Seizures ([place])   . Shortness  of breath dyspnea    with exertion  . Spinal stenosis   . Spinal stenosis   . Suicide attempt ([place])   . Tachycardia - pulse   . Tobacco abuse     Patient Active Problem List   Diagnosis Date Noted  . Pulmonary embolism ([place]) 04/17/2018  . Bradycardia, drug induced 03/01/2018  . Hyperglycemia 03/01/2018  . Clonidine overdose 03/01/2018  . ARF (acute renal failure) ([place]) 09/01/2015  . Hypotension 09/01/2015  . Acute encephalopathy 09/01/2015  . Hematochezia 04/07/2014  . 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 Major depressive disorder, single episode, moderate ([place]) 10/12/2013  . Morbid obesity ([place]) 10/12/2013  . Essential hypertension 10/12/2013  . Chronic pain syndrome 10/12/2013  . MDD (major depressive disorder), recurrent episode, severe ([place]) 09/29/2013  . Rectal pain 08/10/2013  . Tachycardia 07/27/2013  . Urine incontinence 07/27/2013  . Rectal bleeding 07/27/2013  . Left arm pain 03/29/2013  . Lumbar radicular pain 03/23/2013  . Bilateral shoulder pain 10/27/2012  . Smoking 10/27/2012  . Dental abscess 07/14/2012  . Hypertension   . Anxiety   . Spinal stenosis   . Tobacco abuse   . Obesity   . Dyslipidemia   . Polysubstance abuse ([place])   . Chest pain   . Grief 06/11/2012  . Panic attack 01/21/2012  . HYPERCHOLESTEROLEMIA 02/18/2006  . DEPRESSION 02/18/2006  . GERD 02/18/2006  . HEADACHE  02/18/2006    Past Surgical History:  Procedure Laterality Date  . COLONOSCOPY N/A 08/24/2014   Procedure: COLONOSCOPY;  Surgeon: Charlott RakesVincent Schooler, MD;  [facility];  Service: Endoscopy;  Laterality: N/A;  . ESOPHAGOGASTRODUODENOSCOPY (EGD) WITH PROPOFOL N/A 08/24/2014   Procedure: ESOPHAGOGASTRODUODENOSCOPY (EGD) WITH PROPOFOL;  Surgeon: Charlott RakesVincent Schooler, MD;  [facility];  Service: Endoscopy;  Laterality: N/A;  . WISDOM TOOTH EXTRACTION          Home Medications    Prior to Admission medications   Medication Sig Start Date End Date Taking?  Authorizing Provider  acetaminophen (TYLENOL) 500 MG tablet Take 500 mg by mouth every 6 (six) hours as needed for mild pain or moderate pain.    [provider]  apixaban (ELIQUIS) 5 MG TABS tablet Take 2 tablets (10 mg total) by mouth 2 (two) times daily. Then 1 tablet (5 mg) two times daily starting 04/26/18 Patient taking differently: Take 5 mg by mouth 2 (two) times daily.  04/20/18   Catarina Hartshornat, David, MD  cloNIDine (CATAPRES) 0.3 MG tablet Take 1 tablet (0.3 mg total) by mouth 2 (two) times daily. 08/11/18   Lawyer, Cristal Deerhristopher, PA-C  gabapentin (NEURONTIN) 300 MG capsule Take 1 capsule (300 mg total) by mouth 2 (two) times daily. 07/29/18   Jacalyn LefevreHaviland, Julie, MD  metoprolol tartrate (LOPRESSOR) 50 MG tablet Take 1 tablet (50 mg total) by mouth 2 (two) times daily. 08/11/18   Lawyer, Cristal Deerhristopher, PA-C  oxyCODONE-acetaminophen (PERCOCET/ROXICET) 5-325 MG tablet Take 1 tablet by mouth every 4 (four) hours as needed. Patient not taking: Reported on 08/11/2018 05/28/18   Triplett, Tammy, PA-C  QUEtiapine (SEROQUEL) 50 MG tablet Take 1 tablet (50 mg total) by mouth at bedtime. 07/29/18   Jacalyn LefevreHaviland, Julie, MD    Family History Family History  Problem Relation Age of Onset  . Stroke Mother   . Hypertension Mother   . Hyperlipidemia Mother   . Stroke Father   . Hypertension Father   . Dementia Father   . Diabetes Father   . Heart disease Father   . Hyperlipidemia Father   . Cancer Sister        brain  . Diabetes Sister   . Heart disease Sister   . Hyperlipidemia Sister   . Hypertension Sister   . Stroke Sister   . Heart disease Brother   . Hyperlipidemia Brother     Social History Social History   Tobacco Use  . Smoking status: Current Every Day Smoker    Packs/day: 0.50    Years: 0.00    Pack years: 0.00    Types: Cigarettes    Last attempt to quit: 12/12/2016    Years since quitting: 1.6  . Smokeless tobacco: Never Used  Substance Use Topics  . Alcohol use: No  . Drug use: No      Allergies   Blueberry flavor, Penicillins, Robaxin [methocarbamol], and Toradol [ketorolac tromethamine]   Review of Systems Review of Systems  Cardiovascular: Negative for chest pain and leg swelling.  Musculoskeletal: Positive for arthralgias. Negative for joint swelling and neck pain.  Neurological: Negative for weakness, numbness and headaches.     Physical Exam Updated Vital Signs BP (!) 163/135 (BP Location: Right Wrist)   Pulse (!) 111   Temp 99.3 F (37.4 C) (Oral)   Resp 10   Ht 1.753 m (5\' 9" )   Wt 119.7 kg   SpO2 96%   BMI 38.99 kg/m   Physical Exam Vitals signs and nursing  note reviewed.  Constitutional:      Appearance: He is well-developed. He is not diaphoretic.  HENT:     Head: Normocephalic and atraumatic.     Comments: No signs of head injury Eyes:     General:        Right eye: No discharge.        Left eye: No discharge.     Conjunctiva/sclera: Conjunctivae normal.  Pulmonary:     Effort: Pulmonary effort is normal. No respiratory distress.  Musculoskeletal:     Comments: Decreased range of motion of the left shoulder secondary to pain, there is no obvious deformity and there is no bruising abrasions or lacerations overlying the left shoulder.  He has normal range of motion of the other 3 extremities in their entirety.  The left elbow and wrist and hand are all normal in their appearance and range of motion.  There is no crepitance or subcutaneous emphysema in the shoulder.  He has normal pulses at the left radial artery.  No tenderness over the cervical thoracic or lumbar spines  Skin:    General: Skin is warm and dry.     Findings: No erythema or rash.  Neurological:     Mental Status: He is alert.     Coordination: Coordination normal.     Comments: Normal strength and sensation of all 4 extremities including the left upper extremity.  The exam is limited only by pain with range of motion of the shoulder.      ED Treatments / Results   Labs (all labs ordered are listed, but only abnormal results are displayed) Labs Reviewed - No data to display  EKG None  Radiology No results found.  Procedures Procedures (including critical care time)  Medications Ordered in ED Medications - No data to display   Initial Impression / Assessment and Plan / ED Course  I have reviewed the triage vital signs and the nursing notes.  Pertinent labs & imaging results that were available during my care of the patient were reviewed by me and considered in my medical decision making (see chart for details).        The patient is well-appearing, I question the history of a fall with direct injury to the left shoulder.  The patient does have frequent pain complaints in the emergency department.  He is slightly tachycardic but otherwise unremarkable in appearance and is not short of breath or having chest discomfort at this time.  He does report to me that he is taking the Eliquis.  Imaging has been ordered of the left shoulder, ice and a sling for comfort, doubt fracture given exam  Imaging is negative, the patient was informed, he was also noted to be hypertensive and states that he is now out of his clonidine.  I have given him a refill on this medication and he does have follow-up with a family doctor here locally next week  Final Clinical Impressions(s) / ED Diagnoses   Final diagnoses:  Contusion of left shoulder, initial encounter    ED Discharge Orders         Ordered    lidocaine (LIDODERM) 5 %  Every 24 hours     08/15/18 1402    cloNIDine (CATAPRES) 0.3 MG tablet  2 times daily     08/15/18 1423           Eber HongMiller, Annaliyah Willig, MD 08/16/18 [phone]

## 2018-08-15 NOTE — Discharge Instructions (Signed)
Your testing was normal, shows no signs of fractures. You may use the Lidocaine patches to give focal relief of the joint pain Since there is no broken bones, Opiate medicines are not used for this pain You may use the sling as needed for relief.  See the doctor in 1 week if still hurting.

## 2018-08-15 NOTE — ED Triage Notes (Signed)
Pt states he tripped and fell forward down steps and landed on left shoulder. No loc.

## 2018-08-15 NOTE — ED Notes (Signed)
Pharmacy called concerned about possible clonidine abuse. Explained clonidine use can be associated with opiate abuse and this patient has had 764 pills filled since January with all prescriptions coming from various emergency departments.

## 2018-09-06 ENCOUNTER — Other Ambulatory Visit: Payer: Self-pay

## 2018-09-06 ENCOUNTER — Encounter (HOSPITAL_COMMUNITY): Payer: Self-pay | Admitting: Emergency Medicine

## 2018-09-06 ENCOUNTER — Emergency Department (HOSPITAL_COMMUNITY)
Admission: EM | Admit: 2018-09-06 | Discharge: 2018-09-06 | Disposition: A | Discharge status: Home / Self Care | Payer: Self-pay | Attending: Emergency Medicine | Admitting: Emergency Medicine

## 2018-09-06 ENCOUNTER — Emergency Department (HOSPITAL_COMMUNITY): Payer: Self-pay

## 2018-09-06 DIAGNOSIS — I10 Essential (primary) hypertension: Secondary | ICD-10-CM | POA: Insufficient documentation

## 2018-09-06 DIAGNOSIS — F17210 Nicotine dependence, cigarettes, uncomplicated: Secondary | ICD-10-CM | POA: Insufficient documentation

## 2018-09-06 DIAGNOSIS — Z7901 Long term (current) use of anticoagulants: Secondary | ICD-10-CM | POA: Insufficient documentation

## 2018-09-06 DIAGNOSIS — R1030 Lower abdominal pain, unspecified: Secondary | ICD-10-CM | POA: Insufficient documentation

## 2018-09-06 DIAGNOSIS — Z760 Encounter for issue of repeat prescription: Secondary | ICD-10-CM | POA: Insufficient documentation

## 2018-09-06 LAB — CBC WITH DIFFERENTIAL/PLATELET
Abs Immature Granulocytes: 0.03 10*3/uL (ref 0.00–0.07)
Basophils Absolute: 0.1 10*3/uL (ref 0.0–0.1)
Basophils Relative: 1 %
Eosinophils Absolute: 0.1 10*3/uL (ref 0.0–0.5)
Eosinophils Relative: 1 %
HCT: 51.2 % (ref 39.0–52.0)
Hemoglobin: 17 g/dL (ref 13.0–17.0)
Immature Granulocytes: 0 %
Lymphocytes Relative: 20 %
Lymphs Abs: 1.7 10*3/uL (ref 0.7–4.0)
MCH: 28.6 pg (ref 26.0–34.0)
MCHC: 33.2 g/dL (ref 30.0–36.0)
MCV: 86.1 fL (ref 80.0–100.0)
Monocytes Absolute: 0.7 10*3/uL (ref 0.1–1.0)
Monocytes Relative: 7 %
Neutro Abs: 6.3 10*3/uL (ref 1.7–7.7)
Neutrophils Relative %: 71 %
Platelets: 179 10*3/uL (ref 150–400)
RBC: 5.95 MIL/uL — ABNORMAL HIGH (ref 4.22–5.81)
RDW: 13.1 % (ref 11.5–15.5)
WBC: 8.9 10*3/uL (ref 4.0–10.5)
nRBC: 0 % (ref 0.0–0.2)

## 2018-09-06 LAB — URINALYSIS, ROUTINE W REFLEX MICROSCOPIC
Bilirubin Urine: NEGATIVE
Glucose, UA: NEGATIVE mg/dL
Ketones, ur: NEGATIVE mg/dL
Leukocytes,Ua: NEGATIVE
Nitrite: NEGATIVE
Protein, ur: 30 mg/dL — AB
Specific Gravity, Urine: 1.021 (ref 1.005–1.030)
pH: 5 (ref 5.0–8.0)

## 2018-09-06 LAB — COMPREHENSIVE METABOLIC PANEL
ALT: 28 U/L (ref 0–44)
AST: 29 U/L (ref 15–41)
Albumin: 3.9 g/dL (ref 3.5–5.0)
Alkaline Phosphatase: 103 U/L (ref 38–126)
Anion gap: 12 (ref 5–15)
BUN: 10 mg/dL (ref 6–20)
CO2: 25 mmol/L (ref 22–32)
Calcium: 9.6 mg/dL (ref 8.9–10.3)
Chloride: 103 mmol/L (ref 98–111)
Creatinine, Ser: 1.05 mg/dL (ref 0.61–1.24)
GFR calc Af Amer: >60 mL/min (ref >60)
GFR calc non Af Amer: >60 mL/min (ref >60)
Glucose, Bld: 129 mg/dL — ABNORMAL HIGH (ref 70–99)
Potassium: 3.4 mmol/L — ABNORMAL LOW (ref 3.5–5.1)
Sodium: 140 mmol/L (ref 135–145)
Total Bilirubin: 0.6 mg/dL (ref 0.3–1.2)
Total Protein: 7.9 g/dL (ref 6.5–8.1)

## 2018-09-06 LAB — LIPASE, BLOOD: Lipase: 35 U/L (ref 11–51)

## 2018-09-06 LAB — POC OCCULT BLOOD, ED
Fecal Occult Bld: NEGATIVE
Fecal Occult Bld: NEGATIVE

## 2018-09-06 MED ORDER — MORPHINE SULFATE (PF) 4 MG/ML IV SOLN
4.0000 mg | Freq: Once | INTRAVENOUS | Status: AC
  Administered 2018-09-06: 4 mg via INTRAVENOUS
  Filled 2018-09-06: qty 1

## 2018-09-06 MED ORDER — GABAPENTIN 300 MG PO CAPS: 300.0000 mg | ORAL_CAPSULE | Freq: Two times a day (BID) | ORAL | 0 refills | Status: DC

## 2018-09-06 MED ORDER — IOHEXOL 300 MG/ML  SOLN
100.0000 mL | Freq: Once | INTRAMUSCULAR | Status: AC | PRN
  Administered 2018-09-06: 100 mL via INTRAVENOUS

## 2018-09-06 MED ORDER — CLONIDINE HCL 0.2 MG PO TABS
0.3000 mg | ORAL_TABLET | Freq: Once | ORAL | Status: AC
  Administered 2018-09-06: 0.3 mg via ORAL
  Filled 2018-09-06: qty 1

## 2018-09-06 MED ORDER — METOPROLOL TARTRATE 50 MG PO TABS
50.0000 mg | ORAL_TABLET | Freq: Once | ORAL | Status: AC
  Administered 2018-09-06: 50 mg via ORAL
  Filled 2018-09-06: qty 1

## 2018-09-06 NOTE — ED Triage Notes (Signed)
Pt reports he had some bright red rectal bleeding with lower abdominal pain starting this morning. Also had a fall this morning to his knees due to the pain, denies injury.

## 2018-09-06 NOTE — ED Provider Notes (Signed)
[facility] EMERGENCY DEPARTMENT Provider Note   CSN: [id number] Arrival date & time: 09/06/18  [id number]     History   Chief Complaint Chief Complaint  Patient presents with  . Abdominal Pain    HPI Travis [name] is a 53 y.o. male.     HPI  Travis [name] is a 53 y.o. male who presents to the Emergency Department complaining of diffuse lower abdominal pain that began approximately an hour prior to arrival.  Shortly after pain began, he had an brief episode of bright red blood after having a bowel movement.  Noticed blood in the toilet, no rectal pain or known hx of hemorrhoids.  Stool appeared typical brown color.  He describes the pain as cramping and caused him to "double over" and fell to his knees with pain. Pain has not been associated with fever, chills, flank pain, vomiting or dysuria.  No hx of constipation recently.  He denies injury or pain associated with his fall.   He is hypertensive on arrival and states that he did not take his blood pressure medication this morning.  Denies chest pain and shortness of breath  Past Medical History:  Diagnosis Date  . Anxiety    Panic attacks  . Chest pain    Hospital, March, 2014, negative enzymes, patient refused in-hospital  stress test, patient canceled outpatient stress test  . Constipation   . Degenerative disk disease   . Degenerative disk disease   . Depression   . GERD (gastroesophageal reflux disease)   . Hypertension   . Incontinence of urine   . Neuromuscular disorder (HCC)   . Obesity   . Polysubstance abuse (HCC)   . PTSD (post-traumatic stress disorder)   . Pulmonary emboli (HCC)   . Seizures (HCC)   . Shortness of breath dyspnea    with exertion  . Spinal stenosis   . Spinal stenosis   . Suicide attempt (HCC)   . Tachycardia - pulse   . Tobacco abuse     Patient Active Problem List   Diagnosis Date Noted  . Pulmonary embolism (HCC) 04/17/2018  . Bradycardia, drug induced 03/01/2018  . Hyperglycemia  03/01/2018  . Clonidine overdose 03/01/2018  . ARF (acute renal failure) (HCC) 09/01/2015  . Hypotension 09/01/2015  . Acute encephalopathy 09/01/2015  . Hematochezia 04/07/2014  . 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 Major depressive disorder, single episode, moderate (HCC) 10/12/2013  . Morbid obesity (HCC) 10/12/2013  . Essential hypertension 10/12/2013  . Chronic pain syndrome 10/12/2013  . MDD (major depressive disorder), recurrent episode, severe (HCC) 09/29/2013  . Rectal pain 08/10/2013  . Tachycardia 07/27/2013  . Urine incontinence 07/27/2013  . Rectal bleeding 07/27/2013  . Left arm pain 03/29/2013  . Lumbar radicular pain 03/23/2013  . Bilateral shoulder pain 10/27/2012  . Smoking 10/27/2012  . Dental abscess 07/14/2012  . Hypertension   . Anxiety   . Spinal stenosis   . Tobacco abuse   . Obesity   . Dyslipidemia   . Polysubstance abuse (HCC)   . Chest pain   . Grief 06/11/2012  . Panic attack 01/21/2012  . HYPERCHOLESTEROLEMIA 02/18/2006  . DEPRESSION 02/18/2006  . GERD 02/18/2006  . HEADACHE 02/18/2006    Past Surgical History:  Procedure Laterality Date  . COLONOSCOPY N/A 08/24/2014   Procedure: COLONOSCOPY;  Surgeon: Charlott RakesVincent Schooler, MD;  [facility];  Service: Endoscopy;  Laterality: N/A;  . ESOPHAGOGASTRODUODENOSCOPY (EGD) WITH PROPOFOL N/A 08/24/2014   Procedure: ESOPHAGOGASTRODUODENOSCOPY (EGD) WITH PROPOFOL;  Surgeon:  Charlott RakesVincent Schooler, MD;  [facility];  Service: Endoscopy;  Laterality: N/A;  . WISDOM TOOTH EXTRACTION          Home Medications    Prior to Admission medications   Medication Sig Start Date End Date Taking? Authorizing Provider  acetaminophen (TYLENOL) 500 MG tablet Take 500 mg by mouth every 6 (six) hours as needed for mild pain or moderate pain.    [provider]  apixaban (ELIQUIS) 5 MG TABS tablet Take 2 tablets (10 mg total) by mouth 2 (two) times daily. Then 1 tablet (5 mg) two times daily  starting 04/26/18 Patient taking differently: Take 5 mg by mouth 2 (two) times daily.  04/20/18   Catarina Hartshornat, David, MD  cloNIDine (CATAPRES) 0.3 MG tablet Take 1 tablet (0.3 mg total) by mouth 2 (two) times daily for 30 days. 08/15/18 09/14/18  Eber HongMiller, Brian, MD  gabapentin (NEURONTIN) 300 MG capsule Take 1 capsule (300 mg total) by mouth 2 (two) times daily. 07/29/18   Jacalyn LefevreHaviland, Julie, MD  lidocaine (LIDODERM) 5 % Place 1 patch onto the skin daily. Remove & Discard patch within 12 hours or as directed by MD 08/15/18   Eber HongMiller, Brian, MD  metoprolol tartrate (LOPRESSOR) 50 MG tablet Take 1 tablet (50 mg total) by mouth 2 (two) times daily. 08/11/18   Lawyer, Cristal Deerhristopher, PA-C  QUEtiapine (SEROQUEL) 50 MG tablet Take 1 tablet (50 mg total) by mouth at bedtime. 07/29/18   Jacalyn LefevreHaviland, Julie, MD    Family History Family History  Problem Relation Age of Onset  . Stroke Mother   . Hypertension Mother   . Hyperlipidemia Mother   . Stroke Father   . Hypertension Father   . Dementia Father   . Diabetes Father   . Heart disease Father   . Hyperlipidemia Father   . Cancer Sister        brain  . Diabetes Sister   . Heart disease Sister   . Hyperlipidemia Sister   . Hypertension Sister   . Stroke Sister   . Heart disease Brother   . Hyperlipidemia Brother     Social History Social History   Tobacco Use  . Smoking status: Current Every Day Smoker    Packs/day: 0.50    Years: 0.00    Pack years: 0.00    Types: Cigarettes    Last attempt to quit: 12/12/2016    Years since quitting: 1.7  . Smokeless tobacco: Never Used  Substance Use Topics  . Alcohol use: No  . Drug use: No     Allergies   Blueberry flavor, Penicillins, Robaxin [methocarbamol], and Toradol [ketorolac tromethamine]   Review of Systems Review of Systems  Constitutional: Negative for appetite change, chills and fever.  Respiratory: Negative for cough and shortness of breath.   Cardiovascular: Negative for chest pain.   Gastrointestinal: Positive for abdominal pain and anal bleeding. Negative for blood in stool, constipation, nausea and vomiting.  Genitourinary: Negative for decreased urine volume, difficulty urinating, dysuria and flank pain.  Musculoskeletal: Negative for arthralgias, back pain and neck pain.  Skin: Negative for color change and rash.  Neurological: Negative for dizziness, syncope, weakness, light-headedness and numbness.  Hematological: Negative for adenopathy.     Physical Exam Updated Vital Signs BP (!) 156/115   Pulse 86   Temp 98.3 F (36.8 C) (Oral)   Resp 18   Ht 5\' 10"  (1.778 m)   Wt 119 kg   SpO2 94%   BMI 37.64 kg/m  Physical Exam Vitals signs and nursing note reviewed.  Constitutional:      Appearance: He is well-developed. He is not ill-appearing or toxic-appearing.  HENT:     Mouth/Throat:     Mouth: Mucous membranes are moist.  Cardiovascular:     Rate and Rhythm: Normal rate and regular rhythm.     Pulses: Normal pulses.  Pulmonary:     Effort: Pulmonary effort is normal.     Breath sounds: Normal breath sounds.  Chest:     Chest wall: No tenderness.  Abdominal:     Palpations: Abdomen is soft. There is no mass.     Tenderness: There is abdominal tenderness. There is no right CVA tenderness, left CVA tenderness or guarding.     Comments: Diffuse ttp of the lower abdomen.  No guarding or rebound, abd is soft.  No distention  Genitourinary:    Rectum: Guaiac result negative. External hemorrhoid present. No tenderness or anal fissure. Normal anal tone.     Comments: Small non-thrombosed external hemorrhoid present.  No palpable rectal masses, heme-negative brown stool. Musculoskeletal: Normal range of motion.  Skin:    Findings: No rash.  Neurological:     General: No focal deficit present.     Mental Status: He is alert.     Sensory: No sensory deficit.     Motor: Motor function is intact. No weakness.     Comments: CN II-XII grossly intact   Psychiatric:        Mood and Affect: Mood normal.      ED Treatments / Results  Labs (all labs ordered are listed, but only abnormal results are displayed) Labs Reviewed  COMPREHENSIVE METABOLIC PANEL - Abnormal; Notable for the following components:      Result Value   Potassium 3.4 (*)    Glucose, Bld 129 (*)    All other components within normal limits  CBC WITH DIFFERENTIAL/PLATELET - Abnormal; Notable for the following components:   RBC 5.95 (*)    All other components within normal limits  URINALYSIS, ROUTINE W REFLEX MICROSCOPIC - Abnormal; Notable for the following components:   Hgb urine dipstick MODERATE (*)    Protein, ur 30 (*)    Bacteria, UA FEW (*)    All other components within normal limits  LIPASE, BLOOD    EKG EKG Interpretation  Date/Time:  Sunday September 06 2018 10:03:16 EDT Ventricular Rate:  94 PR Interval:    QRS Duration: 89 QT Interval:  342 QTC Calculation: 428 R Axis:   -18 Text Interpretation:  Sinus rhythm LVH by voltage Inferior infarct, age indeterminate When compared with ECG of 08/10/2018 No significant change was found Confirmed by Francine Graven [phone]) on 09/06/2018 11:36:38 AM   Radiology Ct Abdomen Pelvis W Contrast  Result Date: 09/06/2018 CLINICAL DATA:  Abdominal pain with bright red rectal bleeding. EXAM: CT ABDOMEN AND PELVIS WITH CONTRAST TECHNIQUE: Multidetector CT imaging of the abdomen and pelvis was performed using the standard protocol following bolus administration of intravenous contrast. CONTRAST:  128mL OMNIPAQUE IOHEXOL 300 MG/ML  SOLN COMPARISON:  CT scan 08/25/2014 FINDINGS: Lower chest: The lung bases are clear of acute process. No pleural effusion or pulmonary lesions. The heart is normal in size. No pericardial effusion. The distal esophagus and aorta are unremarkable. Hepatobiliary: No focal hepatic lesions or intrahepatic biliary dilatation. The gallbladder is normal. No common bile duct dilatation. Pancreas: No mass,  inflammation or ductal dilatation. Spleen: Normal size.  No focal lesions. Adrenals/Urinary Tract: Adrenal  glands and kidneys are unremarkable and stable. Stable bilateral renal cysts. No renal, ureteral or bladder calculi or mass. Stomach/Bowel: The stomach, duodenum, small bowel and colon are grossly normal without oral contrast. No inflammatory changes, mass lesions or obstructive findings. The terminal ileum and appendix are normal. Vascular/Lymphatic: The aorta is normal in caliber. No dissection. The branch vessels are patent. The major venous structures are patent. No mesenteric or retroperitoneal mass or adenopathy. Small scattered lymph nodes are noted. Reproductive: The prostate gland and seminal vesicles are unremarkable. Other: No pelvic mass or adenopathy. No free pelvic fluid collections. No inguinal mass or adenopathy. No abdominal wall hernia or subcutaneous lesions. Musculoskeletal: No significant findings IMPRESSION: 1. No acute abdominal/pelvic findings, mass lesions or adenopathy. 2. No colonic mass or inflammatory process is identified. 3. Stable renal cysts. Electronically Signed   By: Rudie MeyerP.  Gallerani M.D.   On: 09/06/2018 13:32    Procedures Procedures (including critical care time)  Medications Ordered in ED Medications  cloNIDine (CATAPRES) tablet 0.3 mg (0.3 mg Oral Given 09/06/18 1140)  morphine 4 MG/ML injection 4 mg (4 mg Intravenous Given 09/06/18 1245)  metoprolol tartrate (LOPRESSOR) tablet 50 mg (50 mg Oral Given 09/06/18 1245)  iohexol (OMNIPAQUE) 300 MG/ML solution 100 mL (100 mLs Intravenous Contrast Given 09/06/18 1259)     Initial Impression / Assessment and Plan / ED Course  I have reviewed the triage vital signs and the nursing notes.  Pertinent labs & imaging results that were available during my care of the patient were reviewed by me and considered in my medical decision making (see chart for details).        Patient here with lower abdominal pain.  No  vomiting during ER stay.  Work-up today including CT abdomen and pelvis is reassuring. Heme negative stool on DRE.  External hemorrhoid present which is likely pt's source of bright red blood this morning.  Abdomen is soft without peritoneal signs.  Patient also noted to be hypertensive during ER stay and states he did not take his blood pressure medications this morning.  Dose of his clonidine and metoprolol were given in the department. No chest pain or dyspnea.  He is requesting rx for his gabapentin stating that he has been out for several days and doesn't have appt with PCP until next week per pt.  He agrees to close out pt f/u , return precautions discussed.    Final Clinical Impressions(s) / ED Diagnoses   Final diagnoses:  Lower abdominal pain  Medication refill    ED Discharge Orders    None       Pauline Ausriplett, Naziya Hegwood, PA-C 09/07/18 2040    Samuel JesterMcManus, Kathleen, DO 09/10/18 [phone]

## 2018-09-06 NOTE — Discharge Instructions (Addendum)
It is important that you take your blood pressure medications daily as directed.  You can call your primary doctor to arrange a follow-up appointment for this week.

## 2019-01-03 ENCOUNTER — Emergency Department (HOSPITAL_COMMUNITY): Payer: Self-pay

## 2019-01-03 ENCOUNTER — Emergency Department (HOSPITAL_COMMUNITY)
Admission: EM | Admit: 2019-01-03 | Discharge: 2019-01-03 | Disposition: A | Discharge status: Home / Self Care | Payer: Self-pay | Attending: Emergency Medicine | Admitting: Emergency Medicine

## 2019-01-03 ENCOUNTER — Encounter (HOSPITAL_COMMUNITY): Payer: Self-pay

## 2019-01-03 ENCOUNTER — Other Ambulatory Visit: Payer: Self-pay

## 2019-01-03 DIAGNOSIS — B349 Viral infection, unspecified: Secondary | ICD-10-CM | POA: Insufficient documentation

## 2019-01-03 DIAGNOSIS — R531 Weakness: Secondary | ICD-10-CM

## 2019-01-03 DIAGNOSIS — F17210 Nicotine dependence, cigarettes, uncomplicated: Secondary | ICD-10-CM | POA: Insufficient documentation

## 2019-01-03 DIAGNOSIS — I10 Essential (primary) hypertension: Secondary | ICD-10-CM | POA: Insufficient documentation

## 2019-01-03 DIAGNOSIS — Z79899 Other long term (current) drug therapy: Secondary | ICD-10-CM | POA: Insufficient documentation

## 2019-01-03 DIAGNOSIS — R059 Cough, unspecified: Secondary | ICD-10-CM

## 2019-01-03 DIAGNOSIS — R05 Cough: Secondary | ICD-10-CM

## 2019-01-03 DIAGNOSIS — Z20828 Contact with and (suspected) exposure to other viral communicable diseases: Secondary | ICD-10-CM | POA: Insufficient documentation

## 2019-01-03 LAB — CBC WITH DIFFERENTIAL/PLATELET
Abs Immature Granulocytes: 0.02 10*3/uL (ref 0.00–0.07)
Basophils Absolute: 0.1 10*3/uL (ref 0.0–0.1)
Basophils Relative: 1 %
Eosinophils Absolute: 0.2 10*3/uL (ref 0.0–0.5)
Eosinophils Relative: 3 %
HCT: 42.8 % (ref 39.0–52.0)
Hemoglobin: 14 g/dL (ref 13.0–17.0)
Immature Granulocytes: 0 %
Lymphocytes Relative: 17 %
Lymphs Abs: 1.1 10*3/uL (ref 0.7–4.0)
MCH: 29.6 pg (ref 26.0–34.0)
MCHC: 32.7 g/dL (ref 30.0–36.0)
MCV: 90.5 fL (ref 80.0–100.0)
Monocytes Absolute: 0.6 10*3/uL (ref 0.1–1.0)
Monocytes Relative: 9 %
Neutro Abs: 4.4 10*3/uL (ref 1.7–7.7)
Neutrophils Relative %: 70 %
Platelets: 119 10*3/uL — ABNORMAL LOW (ref 150–400)
RBC: 4.73 MIL/uL (ref 4.22–5.81)
RDW: 12.5 % (ref 11.5–15.5)
WBC: 6.3 10*3/uL (ref 4.0–10.5)
nRBC: 0 % (ref 0.0–0.2)

## 2019-01-03 LAB — BASIC METABOLIC PANEL
Anion gap: 9 (ref 5–15)
BUN: 9 mg/dL (ref 6–20)
CO2: 26 mmol/L (ref 22–32)
Calcium: 8.5 mg/dL — ABNORMAL LOW (ref 8.9–10.3)
Chloride: 104 mmol/L (ref 98–111)
Creatinine, Ser: 0.91 mg/dL (ref 0.61–1.24)
GFR calc Af Amer: >60 mL/min (ref >60)
GFR calc non Af Amer: >60 mL/min (ref >60)
Glucose, Bld: 102 mg/dL — ABNORMAL HIGH (ref 70–99)
Potassium: 3 mmol/L — ABNORMAL LOW (ref 3.5–5.1)
Sodium: 139 mmol/L (ref 135–145)

## 2019-01-03 MED ORDER — SODIUM CHLORIDE 0.9 % IV BOLUS
1000.0000 mL | Freq: Once | INTRAVENOUS | Status: AC
  Administered 2019-01-03: 1000 mL via INTRAVENOUS

## 2019-01-03 MED ORDER — METHYLPREDNISOLONE SODIUM SUCC 125 MG IJ SOLR
125.0000 mg | Freq: Once | INTRAMUSCULAR | Status: AC
  Administered 2019-01-03: 125 mg via INTRAVENOUS
  Filled 2019-01-03: qty 2

## 2019-01-03 MED ORDER — ACETAMINOPHEN 500 MG PO TABS
1000.0000 mg | ORAL_TABLET | Freq: Once | ORAL | Status: AC
  Administered 2019-01-03: 1000 mg via ORAL
  Filled 2019-01-03: qty 2

## 2019-01-03 NOTE — ED Provider Notes (Addendum)
[facility] EMERGENCY DEPARTMENT Provider Note   CSN: [id number] Arrival date & time: 01/03/19  1104     History   Chief Complaint Chief Complaint  Patient presents with  . Shortness of Breath    HPI Travis [name] is a 53 y.o. male.     Chief complaint cough, achiness, weakness, fever, headache for several days.  Review of systems positive for loss of smell and taste.  Close family members have been Covid positive.  T-max per nursing notes 101.1.  Past medical history includes "neuromuscular disorder", substance abuse, PTSD, pulmonary emboli, seizures, anxiety, many others.     Past Medical History:  Diagnosis Date  . Anxiety    Panic attacks  . Chest pain    Hospital, March, 2014, negative enzymes, patient refused in-hospital  stress test, patient canceled outpatient stress test  . Constipation   . Degenerative disk disease   . Degenerative disk disease   . Depression   . GERD (gastroesophageal reflux disease)   . Hypertension   . Incontinence of urine   . Neuromuscular disorder (HCC)   . Obesity   . Polysubstance abuse (HCC)   . PTSD (post-traumatic stress disorder)   . Pulmonary emboli (HCC)   . Seizures (HCC)   . Shortness of breath dyspnea    with exertion  . Spinal stenosis   . Spinal stenosis   . Suicide attempt (HCC)   . Tachycardia - pulse   . Tobacco abuse     Patient Active Problem List   Diagnosis Date Noted  . Pulmonary embolism (HCC) 04/17/2018  . Bradycardia, drug induced 03/01/2018  . Hyperglycemia 03/01/2018  . Clonidine overdose 03/01/2018  . ARF (acute renal failure) (HCC) 09/01/2015  . Hypotension 09/01/2015  . Acute encephalopathy 09/01/2015  . Hematochezia 04/07/2014  . 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 Major depressive disorder, single episode, moderate (HCC) 10/12/2013  . Morbid obesity (HCC) 10/12/2013  . Essential hypertension 10/12/2013  . Chronic pain syndrome 10/12/2013  . MDD (major depressive disorder), recurrent  episode, severe (HCC) 09/29/2013  . Rectal pain 08/10/2013  . Tachycardia 07/27/2013  . Urine incontinence 07/27/2013  . Rectal bleeding 07/27/2013  . Left arm pain 03/29/2013  . Lumbar radicular pain 03/23/2013  . Bilateral shoulder pain 10/27/2012  . Smoking 10/27/2012  . Dental abscess 07/14/2012  . Hypertension   . Anxiety   . Spinal stenosis   . Tobacco abuse   . Obesity   . Dyslipidemia   . Polysubstance abuse (HCC)   . Chest pain   . Grief 06/11/2012  . Panic attack 01/21/2012  . HYPERCHOLESTEROLEMIA 02/18/2006  . DEPRESSION 02/18/2006  . GERD 02/18/2006  . HEADACHE 02/18/2006    Past Surgical History:  Procedure Laterality Date  . COLONOSCOPY N/A 08/24/2014   Procedure: COLONOSCOPY;  Surgeon: Charlott RakesVincent Schooler, MD;  [facility];  Service: Endoscopy;  Laterality: N/A;  . ESOPHAGOGASTRODUODENOSCOPY (EGD) WITH PROPOFOL N/A 08/24/2014   Procedure: ESOPHAGOGASTRODUODENOSCOPY (EGD) WITH PROPOFOL;  Surgeon: Charlott RakesVincent Schooler, MD;  [facility];  Service: Endoscopy;  Laterality: N/A;  . WISDOM TOOTH EXTRACTION          Home Medications    Prior to Admission medications   Medication Sig Start Date End Date Taking? Authorizing Provider  acetaminophen (TYLENOL) 500 MG tablet Take 500 mg by mouth every 6 (six) hours as needed for mild pain or moderate pain.    [provider]  apixaban (ELIQUIS) 5 MG TABS tablet Take 2 tablets (10 mg total) by  mouth 2 (two) times daily. Then 1 tablet (5 mg) two times daily starting 04/26/18 Patient taking differently: Take 5 mg by mouth 2 (two) times daily.  04/20/18   Catarina Hartshorn, MD  cloNIDine (CATAPRES) 0.3 MG tablet Take 1 tablet (0.3 mg total) by mouth 2 (two) times daily for 30 days. 08/15/18 09/14/18  Eber Hong, MD  gabapentin (NEURONTIN) 300 MG capsule Take 1 capsule (300 mg total) by mouth 2 (two) times daily. 09/06/18   Triplett, Tammy, PA-C  lidocaine (LIDODERM) 5 % Place 1 patch onto the skin daily.  Remove & Discard patch within 12 hours or as directed by MD 08/15/18   Eber Hong, MD  metoprolol tartrate (LOPRESSOR) 50 MG tablet Take 1 tablet (50 mg total) by mouth 2 (two) times daily. 08/11/18   Lawyer, Cristal Deer, PA-C  QUEtiapine (SEROQUEL) 50 MG tablet Take 1 tablet (50 mg total) by mouth at bedtime. 07/29/18   Jacalyn Lefevre, MD    Family History Family History  Problem Relation Age of Onset  . Stroke Mother   . Hypertension Mother   . Hyperlipidemia Mother   . Stroke Father   . Hypertension Father   . Dementia Father   . Diabetes Father   . Heart disease Father   . Hyperlipidemia Father   . Cancer Sister        brain  . Diabetes Sister   . Heart disease Sister   . Hyperlipidemia Sister   . Hypertension Sister   . Stroke Sister   . Heart disease Brother   . Hyperlipidemia Brother     Social History Social History   Tobacco Use  . Smoking status: Current Every Day Smoker    Packs/day: 0.50    Years: 0.00    Pack years: 0.00    Types: Cigarettes    Last attempt to quit: 12/12/2016    Years since quitting: 2.0  . Smokeless tobacco: Never Used  Substance Use Topics  . Alcohol use: No  . Drug use: No     Allergies   Blueberry flavor, Penicillins, Robaxin [methocarbamol], and Toradol [ketorolac tromethamine]   Review of Systems Review of Systems  All other systems reviewed and are negative.    Physical Exam Updated Vital Signs BP (!) 157/97   Pulse 79   Temp 98.7 F (37.1 C) (Oral)   Resp 20   Ht 5\' 11"  (1.803 m)   Wt 114.8 kg   SpO2 98%   BMI 35.29 kg/m   Physical Exam Vitals signs and nursing note reviewed.  Constitutional:      Appearance: He is well-developed.  HENT:     Head: Normocephalic and atraumatic.  Eyes:     Conjunctiva/sclera: Conjunctivae normal.  Neck:     Musculoskeletal: Neck supple.  Cardiovascular:     Rate and Rhythm: Normal rate and regular rhythm.  Pulmonary:     Effort: Pulmonary effort is normal.      Breath sounds: Normal breath sounds.  Abdominal:     General: Bowel sounds are normal.     Palpations: Abdomen is soft.  Musculoskeletal: Normal range of motion.  Skin:    General: Skin is warm and dry.  Neurological:     General: No focal deficit present.     Mental Status: He is alert and oriented to person, place, and time.  Psychiatric:        Behavior: Behavior normal.      ED Treatments / Results  Labs (all labs ordered are  listed, but only abnormal results are displayed) Labs Reviewed  CBC WITH DIFFERENTIAL/PLATELET - Abnormal; Notable for the following components:      Result Value   Platelets 119 (*)    All other components within normal limits  BASIC METABOLIC PANEL - Abnormal; Notable for the following components:   Potassium 3.0 (*)    Glucose, Bld 102 (*)    Calcium 8.5 (*)    All other components within normal limits  NOVEL CORONAVIRUS, NAA (HOSP ORDER, SEND-OUT TO REF LAB; TAT 18-24 HRS)    EKG EKG Interpretation  Date/Time:  Sunday January 03 2019 11:24:34 EST Ventricular Rate:  82 PR Interval:    QRS Duration: 105 QT Interval:  397 QTC Calculation: 464 R Axis:   -9 Text Interpretation: Sinus rhythm Confirmed by Nat Christen [phone]) on 01/03/2019 12:31:51 PM   Radiology Dg Chest Port 1 View  Result Date: 01/03/2019 CLINICAL DATA:  Pt brought in by EMS due to weakness, SOB ,cough, aches fever at home reported 101.1. Loss of smell and taste. Both sister and brother in law have symptoms EXAM: PORTABLE CHEST 1 VIEW COMPARISON:  08/10/2018 and older exams. FINDINGS: Cardiac silhouette is normal in size. No mediastinal or hilar masses. No evidence of adenopathy. Clear lungs.  No pleural effusion or pneumothorax. Skeletal structures are grossly intact. IMPRESSION: No active disease. Electronically Signed   By: Lajean Manes M.D.   On: 01/03/2019 12:34    Procedures Procedures (including critical care time)  Medications Ordered in ED Medications   methylPREDNISolone sodium succinate (SOLU-MEDROL) 125 mg/2 mL injection 125 mg (has no administration in time range)  sodium chloride 0.9 % bolus 1,000 mL (1,000 mLs Intravenous New Bag/Given 01/03/19 1228)  acetaminophen (TYLENOL) tablet 1,000 mg (1,000 mg Oral Given 01/03/19 1220)     Initial Impression / Assessment and Plan / ED Course  I have reviewed the triage vital signs and the nursing notes.  Pertinent labs & imaging results that were available during my care of the patient were reviewed by me and considered in my medical decision making (see chart for details).    History and physical suggestive of Covid.  Will hydrate, basic labs, chest x-ray, Covid.   1400: Recheck.  Patient oxygenating well.  No respiratory distress.  Chest x-ray negative.  Covid pending.  Patient is stable for outpatient management.  Travis [name] was evaluated in Emergency Department on 01/03/2019 for the symptoms described in the history of present illness. He was evaluated in the context of the global COVID-19 pandemic, which necessitated consideration that the patient might be at risk for infection with the SARS-CoV-2 virus that causes COVID-19. Institutional protocols and algorithms that pertain to the evaluation of patients at risk for COVID-19 are in a state of rapid change based on information released by regulatory bodies including the CDC and federal and state organizations. These policies and algorithms were followed during the patient's care in the ED.   Final Clinical Impressions(s) / ED Diagnoses   Final diagnoses:  Cough  Weakness  Viral syndrome    ED Discharge Orders    None       Nat Christen, MD 01/03/19 1202    Nat Christen, MD 01/03/19 [phone]

## 2019-01-03 NOTE — ED Triage Notes (Addendum)
Pt brought in by EMS due to weakness, SOB ,cough, aches fever at home reported 101.1. Loss of smell and taste. Both sister and brother in law have symptoms

## 2019-01-03 NOTE — Discharge Instructions (Addendum)
Chest x-ray showed no pneumonia.  There is a good chance you have Covid.  Rest, increase fluids, Tylenol.  Covid test pending.

## 2019-01-04 LAB — NOVEL CORONAVIRUS, NAA (HOSP ORDER, SEND-OUT TO REF LAB; TAT 18-24 HRS): SARS-CoV-2, NAA: NOT DETECTED

## 2019-03-31 ENCOUNTER — Encounter (HOSPITAL_COMMUNITY): Payer: Self-pay | Admitting: Emergency Medicine

## 2019-03-31 ENCOUNTER — Other Ambulatory Visit: Payer: Self-pay

## 2019-03-31 ENCOUNTER — Emergency Department (HOSPITAL_COMMUNITY): Payer: Self-pay

## 2019-03-31 ENCOUNTER — Emergency Department (HOSPITAL_COMMUNITY)
Admission: EM | Admit: 2019-03-31 | Discharge: 2019-03-31 | Disposition: A | Discharge status: Home / Self Care | Payer: Self-pay | Attending: Emergency Medicine | Admitting: Emergency Medicine

## 2019-03-31 DIAGNOSIS — M79605 Pain in left leg: Secondary | ICD-10-CM | POA: Insufficient documentation

## 2019-03-31 DIAGNOSIS — Z79899 Other long term (current) drug therapy: Secondary | ICD-10-CM | POA: Insufficient documentation

## 2019-03-31 DIAGNOSIS — Y9301 Activity, walking, marching and hiking: Secondary | ICD-10-CM | POA: Insufficient documentation

## 2019-03-31 DIAGNOSIS — I10 Essential (primary) hypertension: Secondary | ICD-10-CM | POA: Insufficient documentation

## 2019-03-31 DIAGNOSIS — Y929 Unspecified place or not applicable: Secondary | ICD-10-CM | POA: Insufficient documentation

## 2019-03-31 DIAGNOSIS — W19XXXA Unspecified fall, initial encounter: Secondary | ICD-10-CM

## 2019-03-31 DIAGNOSIS — Y999 Unspecified external cause status: Secondary | ICD-10-CM | POA: Insufficient documentation

## 2019-03-31 DIAGNOSIS — Z7901 Long term (current) use of anticoagulants: Secondary | ICD-10-CM | POA: Insufficient documentation

## 2019-03-31 DIAGNOSIS — W010XXA Fall on same level from slipping, tripping and stumbling without subsequent striking against object, initial encounter: Secondary | ICD-10-CM | POA: Insufficient documentation

## 2019-03-31 DIAGNOSIS — F17210 Nicotine dependence, cigarettes, uncomplicated: Secondary | ICD-10-CM | POA: Insufficient documentation

## 2019-03-31 DIAGNOSIS — M25562 Pain in left knee: Secondary | ICD-10-CM | POA: Insufficient documentation

## 2019-03-31 MED ORDER — GABAPENTIN 300 MG PO CAPS
300.0000 mg | ORAL_CAPSULE | Freq: Once | ORAL | Status: AC
  Administered 2019-03-31: 17:00:00 300 mg via ORAL
  Filled 2019-03-31: qty 1

## 2019-03-31 MED ORDER — ACETAMINOPHEN 500 MG PO TABS
1000.0000 mg | ORAL_TABLET | Freq: Once | ORAL | Status: AC
  Administered 2019-03-31: 16:00:00 1000 mg via ORAL
  Filled 2019-03-31: qty 2

## 2019-03-31 MED ORDER — LIDOCAINE 5 % EX PTCH
1.0000 | MEDICATED_PATCH | Freq: Once | CUTANEOUS | Status: DC
  Administered 2019-03-31: 1 via TRANSDERMAL
  Filled 2019-03-31: qty 1

## 2019-03-31 NOTE — ED Notes (Signed)
Pt transported to radiology.

## 2019-03-31 NOTE — Discharge Instructions (Addendum)
You have been seen today for left leg pain. Please read and follow all provided instructions. Return to the emergency room for worsening condition or new concerning symptoms.    Your x-rays of your left knee, foot and left ankle do not show any broken bones.  The ultrasound does not show a blood clot in your leg either.  1. Medications:  Continue usual home medications Take medications as prescribed. Please review all of the medicines and only take them if you do not have an allergy to them.   2. Treatment: rest, drink plenty of fluids  3. Follow Up:  Please follow up with primary care provider by scheduling an appointment as soon as possible for a visit  -Also recommend you follow-up with Dr. Romeo Apple.  He is an Scientist, research (life sciences).  I have given you his contact information and discharge paperwork.  Please call his office to schedule an emergency department follow-up visit for further evaluation of your left leg pain.   ?

## 2019-03-31 NOTE — ED Triage Notes (Signed)
Pt tripped and fell yesterday.  C/o of left knee pain. Denies hitting head
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
## 2020-04-08 IMAGING — CT CT ANGIO CHEST
2 of 6 series · 17 of 46 positions shown · IV contrast (Isovue)
Comparison: Chest x-ray April 17, 2018

CLINICAL DATA: Central chest pain radiating to bilateral arms.

EXAM:
CT ANGIOGRAPHY CHEST WITH CONTRAST
TECHNIQUE: Multidetector CT imaging of the chest was performed using the
standard protocol during bolus administration of intravenous
contrast. Multiplanar CT image reconstructions and MIPs were
obtained to evaluate the vascular anatomy.
CONTRAST:  100mL RVDAB5-2OY IOPAMIDOL (RVDAB5-2OY) INJECTION 76%

[Series 6: thins · axial · 0.77mm/px · z∈[+1128,+1363]mm · 14 of 259 slices shown]
[im 12/259  lung]
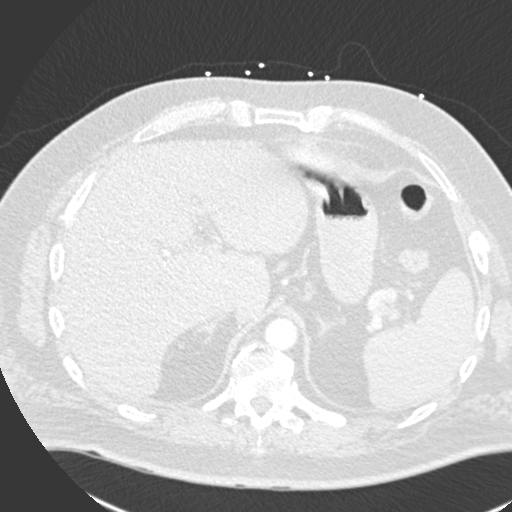
[im 34/259  soft-tissue]
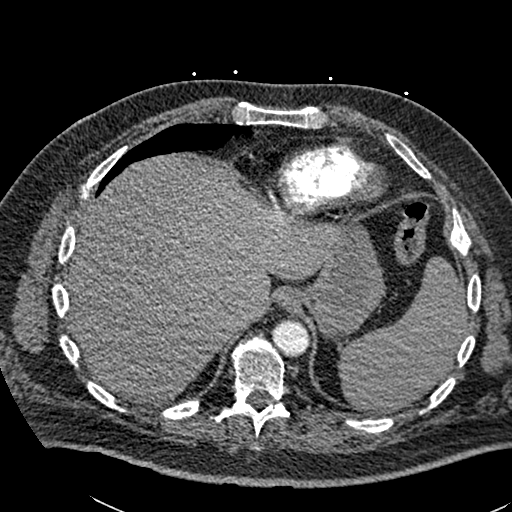
[im 45/259  lung]
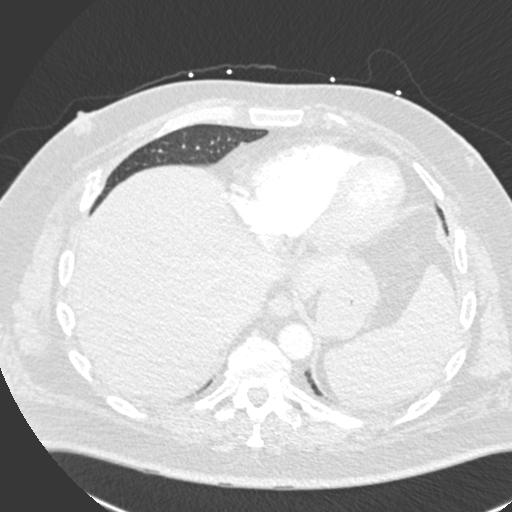
[im 68/259  soft-tissue]
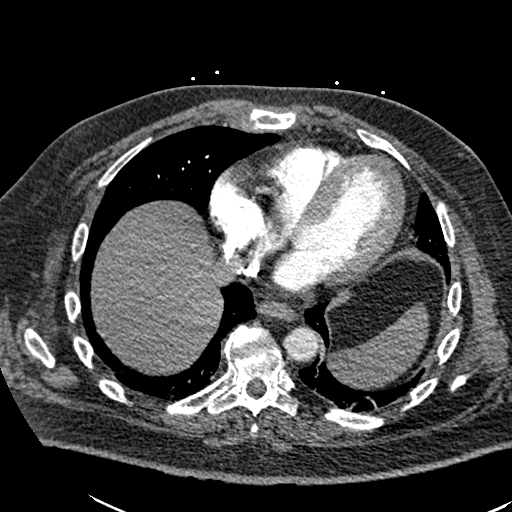
[im 90/259  lung]
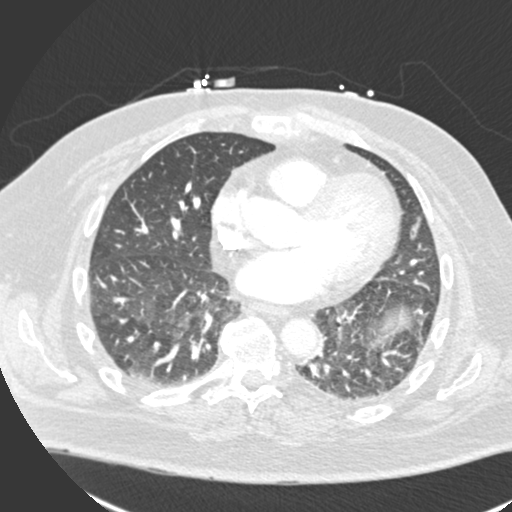
[im 101/259  soft-tissue]
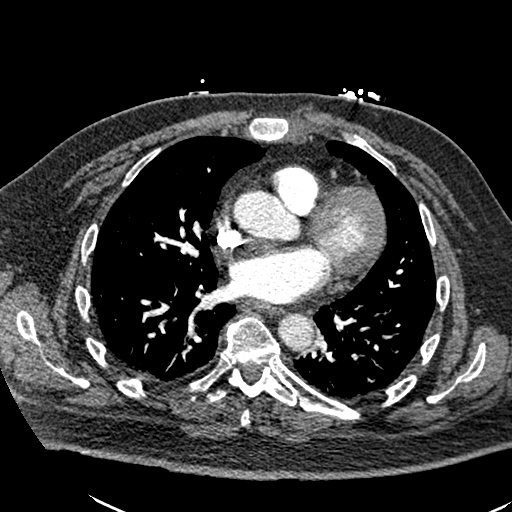
[im 124/259  lung]
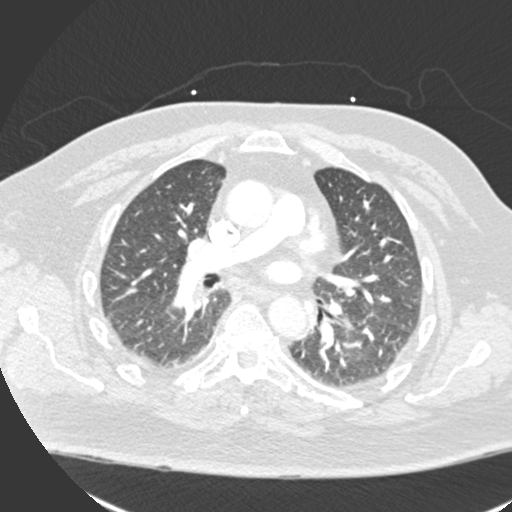
[im 135/259  soft-tissue]
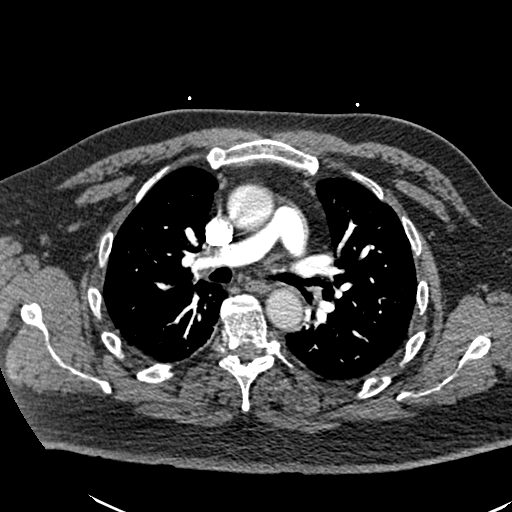
[im 158/259  lung]
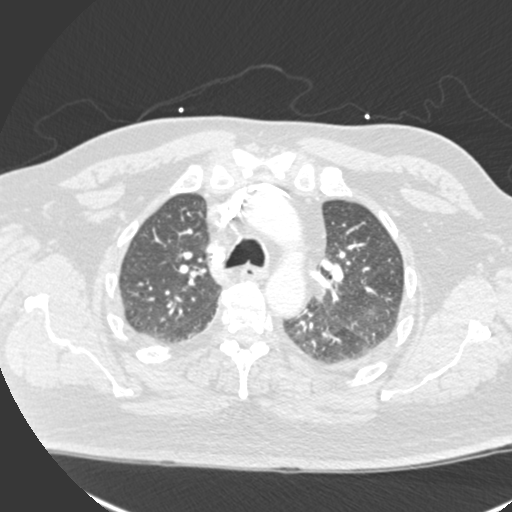
[im 169/259  soft-tissue]
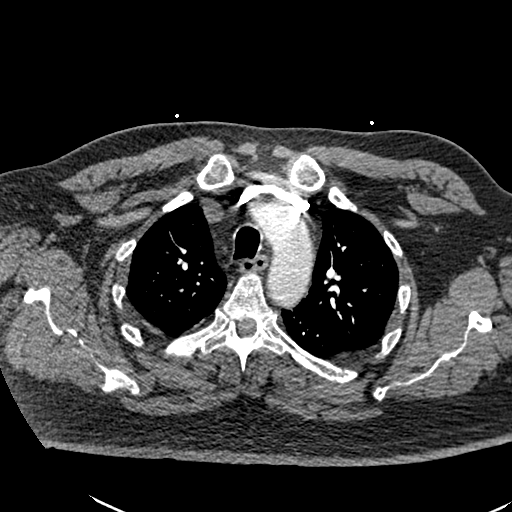
[im 191/259  lung]
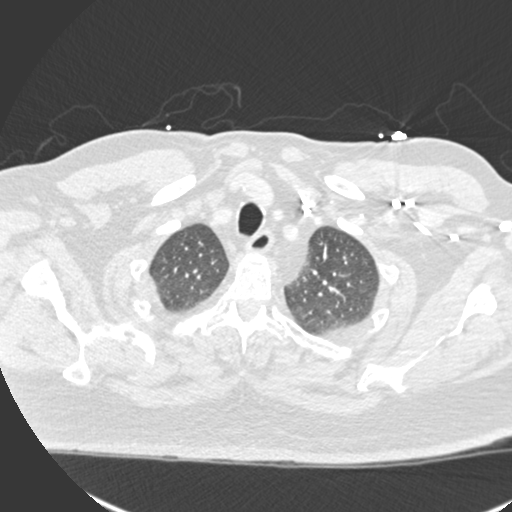
[im 214/259  soft-tissue]
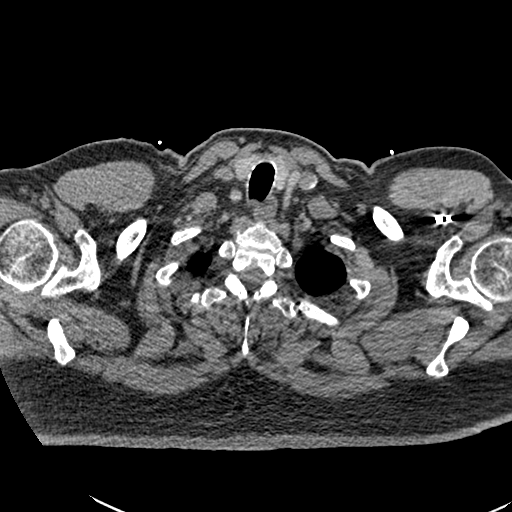
[im 225/259  lung]
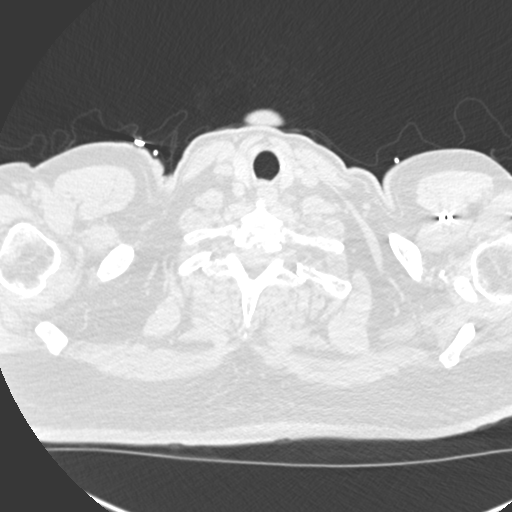
[im 247/259  soft-tissue]
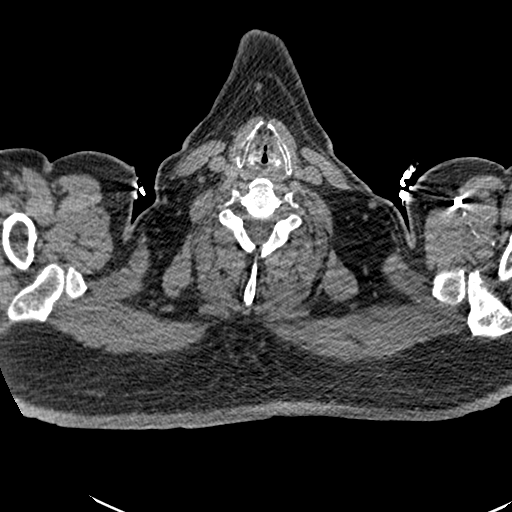

[Series 8: coronal mpr · coronal · 0.56mm/px · 3 of 160 slices shown]
[im 40/160  soft-tissue]
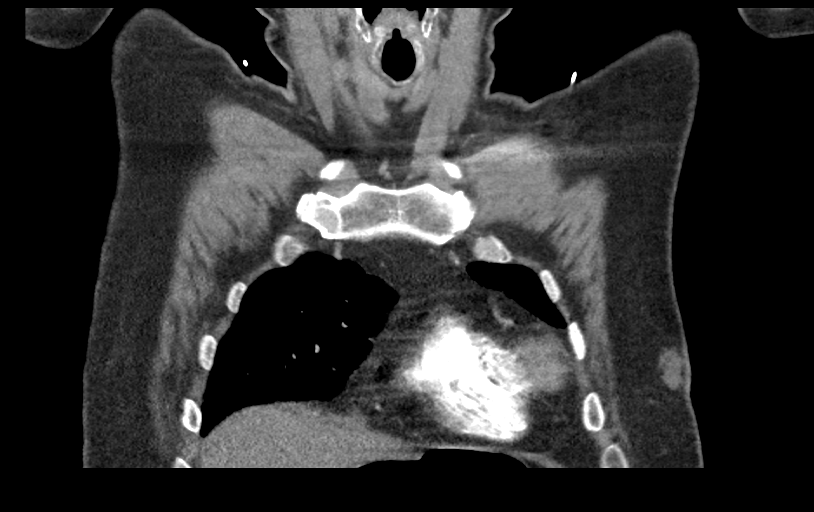
[im 80/160  soft-tissue]
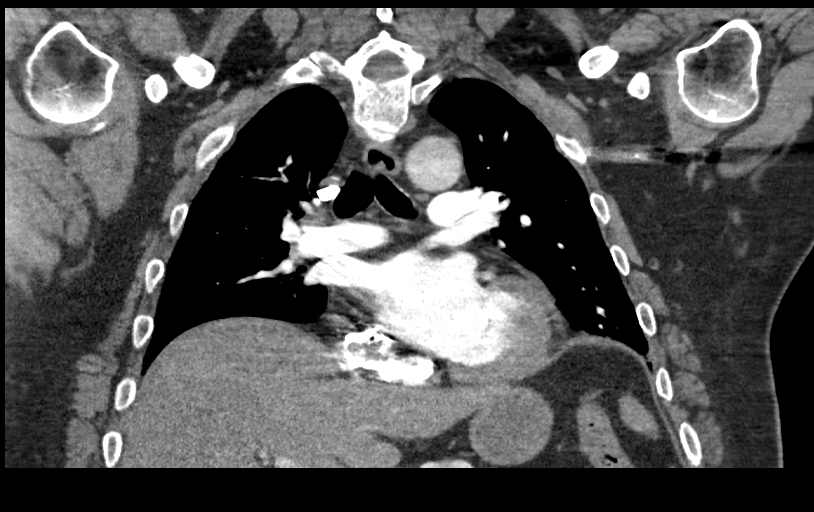
[im 120/160  soft-tissue]
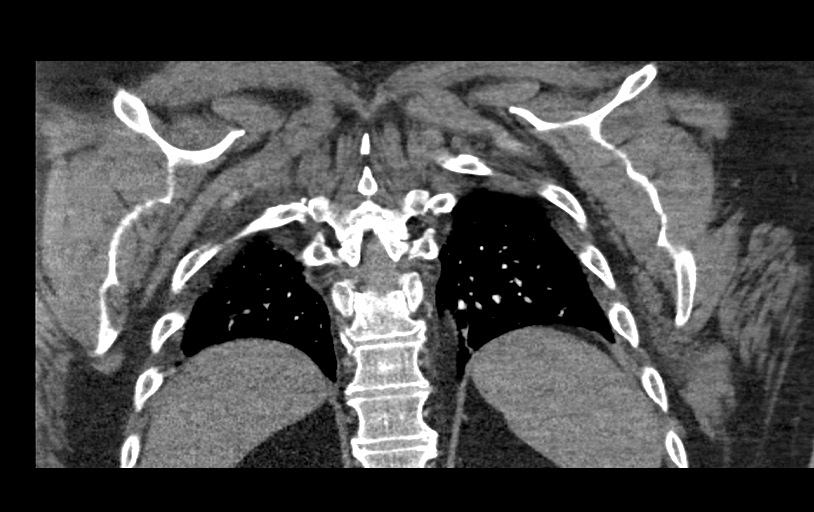

[17 of 46 positions shown; findings below may reference images not displayed]

FINDINGS: Cardiovascular: Status factory opacification of the pulmonary artery
to the segmental level. There is a filling defect in a right lower
lobe segmental pulmonary artery best seen on series 6, image 140. No
filling defect is identified in the bilateral main pulmonary
arteries. The aorta is normal. There is no evidence of aortic
aneurysm or dissection.

Mediastinum/Nodes: No enlarged mediastinal, hilar, or axillary lymph
nodes. Thyroid gland, trachea, and esophagus demonstrate no
significant findings.

Lungs/Pleura: There is no focal pneumonia. Mild atelectasis of
posterior lung bases are noted. There is no evidence of pulmonary
infarct. No pleural effusion or pneumothorax.

Upper Abdomen: No acute abnormality.

Musculoskeletal: Mild degenerative joint changes are noted.

Review of the MIP images confirms the above findings.
IMPRESSION: Small filling defect in a right lower lobe segmental pulmonary
artery, best seen on series 6, image 140 suspicious for pulmonary
embolus.

Mild atelectasis of posterior lung bases. The lungs are otherwise
clear.

These results will be called to the ordering clinician or
representative by the Radiologist Assistant, and communication
documented in the PACS or zVision Dashboard.

## 2020-10-07 ENCOUNTER — Encounter (HOSPITAL_COMMUNITY): Payer: Self-pay

## 2020-10-07 ENCOUNTER — Emergency Department (HOSPITAL_COMMUNITY): Payer: Self-pay

## 2020-10-07 ENCOUNTER — Emergency Department (HOSPITAL_COMMUNITY)
Admission: EM | Admit: 2020-10-07 | Discharge: 2020-10-07 | Disposition: A | Discharge status: Home / Self Care | Payer: Self-pay | Attending: Emergency Medicine | Admitting: Emergency Medicine

## 2020-10-07 ENCOUNTER — Other Ambulatory Visit: Payer: Self-pay

## 2020-10-07 DIAGNOSIS — F17210 Nicotine dependence, cigarettes, uncomplicated: Secondary | ICD-10-CM | POA: Insufficient documentation

## 2020-10-07 DIAGNOSIS — K219 Gastro-esophageal reflux disease without esophagitis: Secondary | ICD-10-CM | POA: Insufficient documentation

## 2020-10-07 DIAGNOSIS — Z7901 Long term (current) use of anticoagulants: Secondary | ICD-10-CM | POA: Insufficient documentation

## 2020-10-07 DIAGNOSIS — K209 Esophagitis, unspecified without bleeding: Secondary | ICD-10-CM | POA: Insufficient documentation

## 2020-10-07 DIAGNOSIS — Z79899 Other long term (current) drug therapy: Secondary | ICD-10-CM | POA: Insufficient documentation

## 2020-10-07 DIAGNOSIS — I10 Essential (primary) hypertension: Secondary | ICD-10-CM | POA: Insufficient documentation

## 2020-10-07 DIAGNOSIS — Z86711 Personal history of pulmonary embolism: Secondary | ICD-10-CM | POA: Insufficient documentation

## 2020-10-07 LAB — CBC WITH DIFFERENTIAL/PLATELET
Abs Immature Granulocytes: 0.02 10*3/uL (ref 0.00–0.07)
Basophils Absolute: 0.1 10*3/uL (ref 0.0–0.1)
Basophils Relative: 1 %
Eosinophils Absolute: 0.1 10*3/uL (ref 0.0–0.5)
Eosinophils Relative: 1 %
HCT: 49.6 % (ref 39.0–52.0)
Hemoglobin: 16.4 g/dL (ref 13.0–17.0)
Immature Granulocytes: 0 %
Lymphocytes Relative: 31 %
Lymphs Abs: 2.6 10*3/uL (ref 0.7–4.0)
MCH: 29.6 pg (ref 26.0–34.0)
MCHC: 33.1 g/dL (ref 30.0–36.0)
MCV: 89.5 fL (ref 80.0–100.0)
Monocytes Absolute: 0.7 10*3/uL (ref 0.1–1.0)
Monocytes Relative: 8 %
Neutro Abs: 4.9 10*3/uL (ref 1.7–7.7)
Neutrophils Relative %: 59 %
Platelets: 202 10*3/uL (ref 150–400)
RBC: 5.54 MIL/uL (ref 4.22–5.81)
RDW: 12.9 % (ref 11.5–15.5)
WBC: 8.3 10*3/uL (ref 4.0–10.5)
nRBC: 0 % (ref 0.0–0.2)

## 2020-10-07 LAB — LIPASE, BLOOD: Lipase: 35 U/L (ref 11–51)

## 2020-10-07 LAB — COMPREHENSIVE METABOLIC PANEL
ALT: 25 U/L (ref 0–44)
AST: 33 U/L (ref 15–41)
Albumin: 4.6 g/dL (ref 3.5–5.0)
Alkaline Phosphatase: 95 U/L (ref 38–126)
Anion gap: 10 (ref 5–15)
BUN: 12 mg/dL (ref 6–20)
CO2: 26 mmol/L (ref 22–32)
Calcium: 9.2 mg/dL (ref 8.9–10.3)
Chloride: 99 mmol/L (ref 98–111)
Creatinine, Ser: 1.1 mg/dL (ref 0.61–1.24)
GFR, Estimated: >60 mL/min (ref >60)
Glucose, Bld: 135 mg/dL — ABNORMAL HIGH (ref 70–99)
Potassium: 2.9 mmol/L — ABNORMAL LOW (ref 3.5–5.1)
Sodium: 135 mmol/L (ref 135–145)
Total Bilirubin: 2 mg/dL — ABNORMAL HIGH (ref 0.3–1.2)
Total Protein: 9 g/dL — ABNORMAL HIGH (ref 6.5–8.1)

## 2020-10-07 MED ORDER — ONDANSETRON 4 MG PO TBDP: ORAL_TABLET | ORAL | 0 refills | Status: DC

## 2020-10-07 MED ORDER — PANTOPRAZOLE SODIUM 40 MG PO TBEC: 40.0000 mg | DELAYED_RELEASE_TABLET | Freq: Every day | ORAL | 0 refills | Status: DC

## 2020-10-07 MED ORDER — SODIUM CHLORIDE 0.9 % IV SOLN: INTRAVENOUS | Status: DC

## 2020-10-07 MED ORDER — IOHEXOL 350 MG/ML SOLN
85.0000 mL | Freq: Once | INTRAVENOUS | Status: AC | PRN
  Administered 2020-10-07: 85 mL via INTRAVENOUS

## 2020-10-07 MED ORDER — METOCLOPRAMIDE HCL 5 MG/ML IJ SOLN
10.0000 mg | Freq: Once | INTRAMUSCULAR | Status: AC
  Administered 2020-10-07: 10 mg via INTRAVENOUS
  Filled 2020-10-07: qty 2

## 2020-10-07 MED ORDER — POTASSIUM CHLORIDE 20 MEQ PO PACK
40.0000 meq | PACK | Freq: Once | ORAL | Status: AC
  Administered 2020-10-07: 40 meq via ORAL
  Filled 2020-10-07: qty 2

## 2020-10-07 MED ORDER — PANTOPRAZOLE SODIUM 40 MG IV SOLR
40.0000 mg | INTRAVENOUS | Status: AC
  Administered 2020-10-07: 40 mg via INTRAVENOUS
  Filled 2020-10-07: qty 40

## 2020-10-07 MED ORDER — ONDANSETRON 4 MG PO TBDP
4.0000 mg | ORAL_TABLET | Freq: Once | ORAL | Status: AC
  Administered 2020-10-07: 4 mg via ORAL
  Filled 2020-10-07: qty 1

## 2020-10-07 NOTE — ED Provider Notes (Signed)
Patient with esophagitis and hypokalemia.  He is put on protonic and given potassium and will follow up with GI this week   Bethann Berkshire, MD 10/07/20 1721

## 2020-10-07 NOTE — ED Triage Notes (Signed)
Pt presents to ED, states 4 days ago had food impaction from piece of steak but was able to get it up,  since he has been having generalized abdominal pain to his back, and vomiting.

## 2020-10-07 NOTE — Discharge Instructions (Addendum)
Follow up with dr. Kelvin Cellar or one of his associates this week.  Call Monday make an appointment this week.  Tell them that you are in the emergency department.  Only take liquids or soft foods.  Return if any problems.  Also follow-up with your family doctor this week to recheck your potassium

## 2020-10-07 NOTE — ED Provider Notes (Signed)
[facility] EMERGENCY DEPARTMENT Provider Note   CSN: [id number] Arrival date & time: 10/07/20  1409     History Chief Complaint  Patient presents with   Abdominal Pain    Tyrion Glaude is a 55 y.o. male.   Abdominal Pain  This patient is a 55 year old male, he has a history significant for pulmonary embolism, history of acid reflux, depression, panic attacks, polysubstance abuse and a history of frequent visits to the emergency department for chronic chest pain.  He has not been seen in the emergency department for over a year, he reports that about 5 days ago while he was eating he got a piece of T-bone steak with bones caught in his esophagus, he tried to drink lots of water to push it down, he ended up swallowing some of it and vomiting some of it back up, since that time he has had a discomfort in his lower chest and upper abdomen which seems to radiate to his back, the pain is bad enough that he was not able to sleep much last night.  He has had nausea and vomited twice yesterday but not today.  He is able to drink liquids without any difficulty, he has not been able to eat very much, when he does eat it goes down but he states he gets full quickly.  He denies a history of pancreatitis.  Symptoms are moderate and he is asking for pain medicine at this time  Past Medical History:  Diagnosis Date   Anxiety    Panic attacks   Chest pain    Hospital, March, 2014, negative enzymes, patient refused in-hospital  stress test, patient canceled outpatient stress test   Constipation    Degenerative disk disease    Degenerative disk disease    Depression    GERD (gastroesophageal reflux disease)    Hypertension    Incontinence of urine    Neuromuscular disorder (HCC)    Obesity    Polysubstance abuse (HCC)    PTSD (post-traumatic stress disorder)    Pulmonary emboli (HCC)    Seizures (HCC)    Shortness of breath dyspnea    with exertion   Spinal stenosis    Spinal stenosis     Suicide attempt (HCC)    Tachycardia - pulse    Tobacco abuse     Patient Active Problem List   Diagnosis Date Noted   Pulmonary embolism (HCC) 04/17/2018   Bradycardia, drug induced 03/01/2018   Hyperglycemia 03/01/2018   Clonidine overdose 03/01/2018   ARF (acute renal failure) (HCC) 09/01/2015   Hypotension 09/01/2015   Acute encephalopathy 09/01/2015   Hematochezia 04/07/2014   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   Major depressive disorder, single episode, moderate (HCC) 10/12/2013   Morbid obesity (HCC) 10/12/2013   Essential hypertension 10/12/2013   Chronic pain syndrome 10/12/2013   MDD (major depressive disorder), recurrent episode, severe (HCC) 09/29/2013   Rectal pain 08/10/2013   Tachycardia 07/27/2013   Urine incontinence 07/27/2013   Rectal bleeding 07/27/2013   Left arm pain 03/29/2013   Lumbar radicular pain 03/23/2013   Bilateral shoulder pain 10/27/2012   Smoking 10/27/2012   Dental abscess 07/14/2012   Hypertension    Anxiety    Spinal stenosis    Tobacco abuse    Obesity    Dyslipidemia    Polysubstance abuse (HCC)    Chest pain    Grief 06/11/2012   Panic attack 01/21/2012   HYPERCHOLESTEROLEMIA 02/18/2006   DEPRESSION 02/18/2006   GERD 02/18/2006  HEADACHE 02/18/2006    Past Surgical History:  Procedure Laterality Date   COLONOSCOPY N/A 08/24/2014   Procedure: COLONOSCOPY;  Surgeon: Charlott RakesVincent Schooler, MD;  [facility];  Service: Endoscopy;  Laterality: N/A;   ESOPHAGOGASTRODUODENOSCOPY (EGD) WITH PROPOFOL N/A 08/24/2014   Procedure: ESOPHAGOGASTRODUODENOSCOPY (EGD) WITH PROPOFOL;  Surgeon: Charlott RakesVincent Schooler, MD;  [facility];  Service: Endoscopy;  Laterality: N/A;   WISDOM TOOTH EXTRACTION         Family History  Problem Relation Age of Onset   Stroke Mother    Hypertension Mother    Hyperlipidemia Mother    Stroke Father    Hypertension Father    Dementia Father    Diabetes Father    Heart disease Father     Hyperlipidemia Father    Cancer Sister        brain   Diabetes Sister    Heart disease Sister    Hyperlipidemia Sister    Hypertension Sister    Stroke Sister    Heart disease Brother    Hyperlipidemia Brother     Social History   Tobacco Use   Smoking status: Every Day    Packs/day: 0.50    Years: 0.00    Pack years: 0.00    Types: Cigarettes    Last attempt to quit: 12/12/2016    Years since quitting: 3.8   Smokeless tobacco: Never  Vaping Use   Vaping Use: Never used  Substance Use Topics   Alcohol use: No   Drug use: No    Home Medications Prior to Admission medications   Medication Sig Start Date End Date Taking? Authorizing Provider  acetaminophen (TYLENOL) 500 MG tablet Take 500 mg by mouth every 6 (six) hours as needed for mild pain or moderate pain.    [provider]  apixaban (ELIQUIS) 5 MG TABS tablet Take 2 tablets (10 mg total) by mouth 2 (two) times daily. Then 1 tablet (5 mg) two times daily starting 04/26/18 Patient taking differently: Take 5 mg by mouth 2 (two) times daily.  04/20/18   Catarina Hartshornat, David, MD  cloNIDine (CATAPRES) 0.3 MG tablet Take 1 tablet (0.3 mg total) by mouth 2 (two) times daily for 30 days. 08/15/18 09/14/18  Eber HongMiller, Priya Matsen, MD  gabapentin (NEURONTIN) 300 MG capsule Take 1 capsule (300 mg total) by mouth 2 (two) times daily. 09/06/18   Triplett, Tammy, PA-C  lidocaine (LIDODERM) 5 % Place 1 patch onto the skin daily. Remove & Discard patch within 12 hours or as directed by MD 08/15/18   Eber HongMiller, Linzi Ohlinger, MD  metoprolol tartrate (LOPRESSOR) 50 MG tablet Take 1 tablet (50 mg total) by mouth 2 (two) times daily. 08/11/18   Lawyer, Cristal Deerhristopher, PA-C  QUEtiapine (SEROQUEL) 50 MG tablet Take 1 tablet (50 mg total) by mouth at bedtime. 07/29/18   Jacalyn LefevreHaviland, Julie, MD    Allergies    Blueberry flavor, Penicillins, Robaxin [methocarbamol], and Toradol [ketorolac tromethamine]  Review of Systems   Review of Systems  Gastrointestinal:  Positive for  abdominal pain.  All other systems reviewed and are negative.  Physical Exam Updated Vital Signs BP (!) 163/108 (BP Location: Right Arm)   Pulse 67   Temp 98.5 F (36.9 C) (Oral)   Resp 18   Wt 123.4 kg   SpO2 100%   BMI 5312.14 kg/m   Physical Exam Vitals and nursing note reviewed.  Constitutional:      General: He is not in acute distress.    Appearance: He is  well-developed.  HENT:     Head: Normocephalic and atraumatic.     Mouth/Throat:     Pharynx: No oropharyngeal exudate.  Eyes:     General: No scleral icterus.       Right eye: No discharge.        Left eye: No discharge.     Conjunctiva/sclera: Conjunctivae normal.     Pupils: Pupils are equal, round, and reactive to light.  Neck:     Thyroid: No thyromegaly.     Vascular: No JVD.  Cardiovascular:     Rate and Rhythm: Normal rate and regular rhythm.     Heart sounds: Normal heart sounds. No murmur heard.   No friction rub. No gallop.  Pulmonary:     Effort: Pulmonary effort is normal. No respiratory distress.     Breath sounds: Normal breath sounds. No wheezing or rales.  Abdominal:     General: Bowel sounds are normal. There is distension.     Palpations: Abdomen is soft. There is no mass.     Tenderness: There is no abdominal tenderness.     Comments: Moderate tenderness in the epigastrium, no other abdominal tenderness, no guarding  Musculoskeletal:        General: No tenderness. Normal range of motion.     Cervical back: Normal range of motion and neck supple.     Right lower leg: No edema.     Left lower leg: No edema.  Lymphadenopathy:     Cervical: No cervical adenopathy.  Skin:    General: Skin is warm and dry.     Findings: No erythema or rash.  Neurological:     Mental Status: He is alert.     Coordination: Coordination normal.  Psychiatric:        Behavior: Behavior normal.    ED Results / Procedures / Treatments   Labs (all labs ordered are listed, but only abnormal results are  displayed) Labs Reviewed  CBC WITH DIFFERENTIAL/PLATELET  COMPREHENSIVE METABOLIC PANEL  LIPASE, BLOOD  URINALYSIS, ROUTINE W REFLEX MICROSCOPIC    EKG None  Radiology No results found.  Procedures Procedures   Medications Ordered in ED Medications  ondansetron (ZOFRAN-ODT) disintegrating tablet 4 mg (has no administration in time range)  0.9 %  sodium chloride infusion (has no administration in time range)  metoCLOPramide (REGLAN) injection 10 mg (has no administration in time range)    ED Course  I have reviewed the triage vital signs and the nursing notes.  Pertinent labs & imaging results that were available during my care of the patient were reviewed by me and considered in my medical decision making (see chart for details).    MDM Rules/Calculators/A&P                           Would consider 1 of several different etiologies including potentially having an ongoing food bolus though this seems less likely as he is able to swallow water and food just with early satiety.  Would consider pancreatitis, peptic ulcer disease, gastritis, he may have an esophageal stricture, Mallory-Weiss tear, doubt Boerhaave syndrome given that he is well-appearing and nontoxic whatsoever.  He did undergo endoscopy because of hematochezia and dysphagia back in 2016, this showed that the patient had hemorrhagic gastritis in the proximal and mid stomach.  The patient has a nonperitoneal nonsurgical abdomen, vital signs are remarkable only for mild hypertension, labs and CT scan of been ordered.  We  will avoid opiate medications given the patient's history, antiemetics and IV fluids ordered  And change of shift, care signed out to Dr. Estell Harpin to follow-up results and disposition accordingly  Final Clinical Impression(s) / ED Diagnoses Final diagnoses:  None    Rx / DC Orders ED Discharge Orders     None        Eber Hong, MD 10/07/20 1459

## 2021-03-01 ENCOUNTER — Other Ambulatory Visit: Payer: Self-pay

## 2021-03-01 ENCOUNTER — Emergency Department (HOSPITAL_COMMUNITY): Payer: Self-pay

## 2021-03-01 ENCOUNTER — Encounter (HOSPITAL_COMMUNITY): Payer: Self-pay | Admitting: Emergency Medicine

## 2021-03-01 ENCOUNTER — Emergency Department (HOSPITAL_COMMUNITY)
Admission: EM | Admit: 2021-03-01 | Discharge: 2021-03-02 | Disposition: A | Discharge status: Home / Self Care | Payer: Self-pay | Attending: Emergency Medicine | Admitting: Emergency Medicine

## 2021-03-01 DIAGNOSIS — F17210 Nicotine dependence, cigarettes, uncomplicated: Secondary | ICD-10-CM | POA: Insufficient documentation

## 2021-03-01 DIAGNOSIS — X500XXA Overexertion from strenuous movement or load, initial encounter: Secondary | ICD-10-CM | POA: Insufficient documentation

## 2021-03-01 DIAGNOSIS — M5441 Lumbago with sciatica, right side: Secondary | ICD-10-CM | POA: Insufficient documentation

## 2021-03-01 MED ORDER — KETOROLAC TROMETHAMINE 15 MG/ML IJ SOLN
15.0000 mg | Freq: Once | INTRAMUSCULAR | Status: AC
  Administered 2021-03-01: 15 mg via INTRAMUSCULAR
  Filled 2021-03-01: qty 1

## 2021-03-01 MED ORDER — HYDROMORPHONE HCL 1 MG/ML IJ SOLN: 1.0000 mg | Freq: Once | INTRAMUSCULAR | Status: DC

## 2021-03-01 NOTE — ED Triage Notes (Signed)
Pt brought in by [facility] for c/o back pain after a fall 5 days ago. Seen by PCP today and given a shot of Toradol and some prednisone. Brother told EMS that pt was "pain seeking".

## 2021-03-01 NOTE — ED Notes (Signed)
When this nurse walked into room to obtain vitals- pt was asleep- I stood there for a few minutes -pt sleeping well, no facial grimacing or pain observed. When this nurse started moving monitoring equipment, pt woke up and started groaning.

## 2021-03-01 NOTE — ED Provider Notes (Signed)
[facility] Emergency Department Provider Note MRN:  [id number]  Arrival date & time: 03/01/21     Chief Complaint   Back Pain   History of Present Illness   Travis [name] is a 55 y.o. year-old male with a history of spinal stenosis presenting to the ED with chief complaint of back pain.  Acute and sudden onset lower back pain after trying to lift a heavy object.  This occurred 4 days ago.  Felt a pop or a snap in the lower right side of the back.  Pain has been slowly building and is now severe since that time.  Patient fell to the ground after feeling the sudden onset of pain.  Denies head trauma, no loss of consciousness.  For the past day he has had trouble ambulating due to the pain.  Denies any numbness or weakness in the arms or legs, no bowel or bladder dysfunction.  Was provided with Toradol and steroids at PCP today but pain continues.  No abdominal pain, no dysuria or hematuria.  Review of Systems  A complete 10 system review of systems was obtained and all systems are negative except as noted in the HPI and PMH.   Patient's Health History    Past Medical History:  Diagnosis Date   Anxiety    Panic attacks   Chest pain    Hospital, March, 2014, negative enzymes, patient refused in-hospital  stress test, patient canceled outpatient stress test   Constipation    Degenerative disk disease    Degenerative disk disease    Depression    GERD (gastroesophageal reflux disease)    Hypertension    Incontinence of urine    Neuromuscular disorder (HCC)    Obesity    Polysubstance abuse ([place])    PTSD (post-traumatic stress disorder)    Pulmonary emboli (HCC)    Seizures (HCC)    Shortness of breath dyspnea    with exertion   Spinal stenosis    Spinal stenosis    Suicide attempt ([place])    Tachycardia - pulse    Tobacco abuse     Past Surgical History:  Procedure Laterality Date   COLONOSCOPY N/A 08/24/2014   Procedure: COLONOSCOPY;  Surgeon:  Wilford Corner, MD;  Location: [facility] ENDOSCOPY;  Service: Endoscopy;  Laterality: N/A;   ESOPHAGOGASTRODUODENOSCOPY (EGD) WITH PROPOFOL N/A 08/24/2014   Procedure: ESOPHAGOGASTRODUODENOSCOPY (EGD) WITH PROPOFOL;  Surgeon: Wilford Corner, MD;  Location: [facility] ENDOSCOPY;  Service: Endoscopy;  Laterality: N/A;   WISDOM TOOTH EXTRACTION      Family History  Problem Relation Age of Onset   Stroke Mother    Hypertension Mother    Hyperlipidemia Mother    Stroke Father    Hypertension Father    Dementia Father    Diabetes Father    Heart disease Father    Hyperlipidemia Father    Cancer Sister        brain   Diabetes Sister    Heart disease Sister    Hyperlipidemia Sister    Hypertension Sister    Stroke Sister    Heart disease Brother    Hyperlipidemia Brother     Social History   Socioeconomic History   Marital status: Divorced    Spouse name: Not on file   Number of children: Not on file   Years of education: Not on file   Highest education level: Not on file  Occupational History   Not on file  Tobacco Use   Smoking status:  Every Day    Packs/day: 0.50    Years: 0.00    Pack years: 0.00    Types: Cigarettes    Last attempt to quit: 12/12/2016    Years since quitting: 4.2   Smokeless tobacco: Never  Vaping Use   Vaping Use: Never used  Substance and Sexual Activity   Alcohol use: No   Drug use: No   Sexual activity: Never  Other Topics Concern   Not on file  Social History Narrative   Not on file   Social Determinants of Health   Financial Resource Strain: Not on file  Food Insecurity: Not on file  Transportation Needs: Not on file  Physical Activity: Not on file  Stress: Not on file  Social Connections: Not on file  Intimate Partner Violence: Not on file     Physical Exam   Vitals:   03/01/21 2040 03/01/21 2300  BP: (!) 151/94 (!) 162/95  Pulse: 89 71  Resp: 16 18  Temp: 98.2 F (36.8 C)   SpO2: 100% 100%    CONSTITUTIONAL: Chronically  ill-appearing, NAD NEURO:  Alert and oriented x 3, no focal deficits EYES:  eyes equal and reactive ENT/NECK:  no LAD, no JVD CARDIO: Regular rate, well-perfused, normal S1 and S2 PULM:  CTAB no wheezing or rhonchi GI/GU:  normal bowel sounds, non-distended, non-tender MSK/SPINE:  No gross deformities, no edema SKIN:  no rash, atraumatic PSYCH:  Appropriate speech and behavior  *Additional and/or pertinent findings included in MDM below  Diagnostic and Interventional Summary    EKG Interpretation  Date/Time:    Ventricular Rate:    PR Interval:    QRS Duration:   QT Interval:    QTC Calculation:   R Axis:     Text Interpretation:         Labs Reviewed - No data to display  DG Lumbar Spine Complete    (Results Pending)    Medications  HYDROmorphone (DILAUDID) injection 1 mg (has no administration in time range)     Procedures  /  Critical Care Procedures  ED Course and Medical Decision Making  I have reviewed the triage vital signs, the nursing notes, and pertinent available records from the EMR.  Listed above are laboratory and imaging tests that I personally ordered, reviewed, and interpreted and then considered in my medical decision making (see below for details).  Acute back pain followed by fall after trying to lift a heavy object.  The pain is in the lower right SI region with radiation down the back of the right leg.  Suspect sciatica.  Could be related to a herniated disc.  He exhibits no signs of myelopathy, normal and symmetric strength and sensation, normal reflexes of the lower extremities.  Given the fall and patient's comorbidities will obtain screening x-ray.  Patient has a somewhat remote history of possible drug-seeking behavior however has not had many ED visits over the past 2 years, has not had any controlled substances prescribed to him recently.     X-rays without acute fracture, some degenerative changes.  Able to treat patient's pain without  opioids.  Appropriate for discharge.  Elmer Sow. Pilar Plate, MD [facility] [email or web address]  Final Clinical Impressions(s) / ED Diagnoses     ICD-10-CM   1. Acute right-sided low back pain with right-sided sciatica  M54.41       ED Discharge Orders     None  Discharge Instructions Discussed with and Provided to Patient:   Discharge Instructions   None       Maudie Flakes, MD 03/02/21 0021

## 2021-03-02 MED ORDER — LIDOCAINE 5 % EX PTCH: 1.0000 | MEDICATED_PATCH | CUTANEOUS | 0 refills | Status: DC

## 2021-03-02 NOTE — Discharge Instructions (Signed)
You were evaluated in the Emergency Department and after careful evaluation, we did not find any emergent condition requiring admission or further testing in the hospital.  Your exam/testing today was overall reassuring.  X-ray did not show any broken bones.  You do have some arthritis or degenerative changes in your lower back.  Recommend close follow-up with your primary care doctor.  Continue use of the steroids, muscle relaxers, can use the numbing patches as needed.  Please return to the Emergency Department if you experience any worsening of your condition.  Thank you for allowing [place] to be a part of your care.

## 2021-03-04 ENCOUNTER — Encounter (HOSPITAL_COMMUNITY): Payer: Self-pay | Admitting: Emergency Medicine

## 2021-03-04 ENCOUNTER — Emergency Department (HOSPITAL_COMMUNITY): Payer: Self-pay

## 2021-03-04 ENCOUNTER — Emergency Department (HOSPITAL_COMMUNITY)
Admission: EM | Admit: 2021-03-04 | Discharge: 2021-03-04 | Disposition: A | Discharge status: Home / Self Care | Payer: Self-pay | Attending: Emergency Medicine | Admitting: Emergency Medicine

## 2021-03-04 ENCOUNTER — Other Ambulatory Visit: Payer: Self-pay

## 2021-03-04 DIAGNOSIS — I10 Essential (primary) hypertension: Secondary | ICD-10-CM | POA: Insufficient documentation

## 2021-03-04 DIAGNOSIS — Z79899 Other long term (current) drug therapy: Secondary | ICD-10-CM | POA: Insufficient documentation

## 2021-03-04 DIAGNOSIS — R1031 Right lower quadrant pain: Secondary | ICD-10-CM | POA: Insufficient documentation

## 2021-03-04 LAB — COMPREHENSIVE METABOLIC PANEL
ALT: 22 U/L (ref 0–44)
AST: 20 U/L (ref 15–41)
Albumin: 3.5 g/dL (ref 3.5–5.0)
Alkaline Phosphatase: 90 U/L (ref 38–126)
Anion gap: 7 (ref 5–15)
BUN: 15 mg/dL (ref 6–20)
CO2: 26 mmol/L (ref 22–32)
Calcium: 8.8 mg/dL — ABNORMAL LOW (ref 8.9–10.3)
Chloride: 101 mmol/L (ref 98–111)
Creatinine, Ser: 1.03 mg/dL (ref 0.61–1.24)
GFR, Estimated: >60 mL/min (ref >60)
Glucose, Bld: 150 mg/dL — ABNORMAL HIGH (ref 70–99)
Potassium: 3.5 mmol/L (ref 3.5–5.1)
Sodium: 134 mmol/L — ABNORMAL LOW (ref 135–145)
Total Bilirubin: 0.8 mg/dL (ref 0.3–1.2)
Total Protein: 7.2 g/dL (ref 6.5–8.1)

## 2021-03-04 LAB — LIPASE, BLOOD: Lipase: 29 U/L (ref 11–51)

## 2021-03-04 LAB — CBC
HCT: 46.2 % (ref 39.0–52.0)
Hemoglobin: 15.2 g/dL (ref 13.0–17.0)
MCH: 29.2 pg (ref 26.0–34.0)
MCHC: 32.9 g/dL (ref 30.0–36.0)
MCV: 88.8 fL (ref 80.0–100.0)
Platelets: 171 10*3/uL (ref 150–400)
RBC: 5.2 MIL/uL (ref 4.22–5.81)
RDW: 12.8 % (ref 11.5–15.5)
WBC: 14.1 10*3/uL — ABNORMAL HIGH (ref 4.0–10.5)
nRBC: 0 % (ref 0.0–0.2)

## 2021-03-04 MED ORDER — KETAMINE HCL 50 MG/5ML IJ SOSY
15.0000 mg | PREFILLED_SYRINGE | Freq: Once | INTRAMUSCULAR | Status: AC
Start: 2021-03-04 — End: 2021-03-04
  Administered 2021-03-04: 15 mg via INTRAVENOUS
  Filled 2021-03-04: qty 5

## 2021-03-04 MED ORDER — FENTANYL CITRATE PF 50 MCG/ML IJ SOSY
50.0000 ug | PREFILLED_SYRINGE | Freq: Once | INTRAMUSCULAR | Status: AC
Start: 2021-03-04 — End: 2021-03-04
  Administered 2021-03-04: 50 ug via INTRAVENOUS
  Filled 2021-03-04: qty 1

## 2021-03-04 MED ORDER — IOHEXOL 300 MG/ML  SOLN
100.0000 mL | Freq: Once | INTRAMUSCULAR | Status: AC | PRN
  Administered 2021-03-04: 100 mL via INTRAVENOUS

## 2021-03-04 NOTE — ED Notes (Signed)
Patient transported to CT 

## 2021-03-04 NOTE — ED Notes (Signed)
Pt transported to CT by the RN. Pt was able to tolerate scan

## 2021-03-04 NOTE — ED Notes (Signed)
Called x 3 NO answer 

## 2021-03-04 NOTE — ED Provider Notes (Signed)
[facility] EMERGENCY DEPARTMENT Provider Note   CSN: [id number] Arrival date & time: 03/04/21  1103     History  Chief Complaint  Patient presents with   Abdominal Pain    Travis [name] is a 56 y.o. male with past medical history significant for HTN, obesity, right-sided sciatica, depression who presents with right lower quadrant abdominal pain.  The patient states that this pain has been present ever since Christmas eve when he bent down to pick something up and felt a pop on his right side.  It has been getting progressively worse over that time to the point where now he can barely walk.  He says the pain is sharp and stabbing.  It radiates to his right groin, to his right back, and a little bit into his right leg.  He reports that he has had more difficulty starting his stream when urinating and has been more constipated.  He denies fevers at home.  He reported to the emergency department on 12/29 at which time a lumbar plain film was performed and negative.  He was discharged without any additional work-up at that time. He denies any new trauma since that ED visit. He reports at least 2 episodes of vomiting today and yesterday.  Home Medications Prior to Admission medications   Medication Sig Start Date End Date Taking? Authorizing Provider  acetaminophen (TYLENOL) 500 MG tablet Take 500 mg by mouth every 6 (six) hours as needed for mild pain or moderate pain.    [provider]  amLODipine (NORVASC) 10 MG tablet Take 10 mg by mouth daily. 08/17/20   [provider]  apixaban (ELIQUIS) 5 MG TABS tablet Take 2 tablets (10 mg total) by mouth 2 (two) times daily. Then 1 tablet (5 mg) two times daily starting 04/26/18 Patient not taking: Reported on 10/07/2020 04/20/18   Tat, Shanon Brow, MD  CLARITIN 10 MG tablet Take 10 mg by mouth daily as needed. 08/17/20   [provider]  cloNIDine (CATAPRES) 0.3 MG tablet Take 1 tablet (0.3 mg total) by mouth 2 (two)  times daily for 30 days. Patient not taking: Reported on 10/07/2020 08/15/18 09/14/18  Noemi Chapel, MD  cyclobenzaprine (FLEXERIL) 10 MG tablet Take 10 mg by mouth 2 (two) times daily. 08/17/20   [provider]  famotidine (PEPCID) 20 MG tablet Take 20 mg by mouth daily as needed. 08/17/20   [provider]  gabapentin (NEURONTIN) 300 MG capsule Take 1 capsule (300 mg total) by mouth 2 (two) times daily. 09/06/18   Triplett, Tammy, PA-C  lidocaine (LIDODERM) 5 % Place 1 patch onto the skin daily. Remove & Discard patch within 12 hours or as directed by MD 03/02/21   Maudie Flakes, MD  losartan (COZAAR) 50 MG tablet Take 50 mg by mouth daily.    [provider]  metoprolol tartrate (LOPRESSOR) 100 MG tablet Take 100 mg by mouth 2 (two) times daily. 08/17/20   [provider]  metoprolol tartrate (LOPRESSOR) 50 MG tablet Take 1 tablet (50 mg total) by mouth 2 (two) times daily. Patient not taking: No sig reported 08/11/18   Dalia Heading, PA-C  ondansetron (ZOFRAN ODT) 4 MG disintegrating tablet 4mg  ODT q4 hours prn nausea/vomit 10/07/20   Milton Ferguson, MD  pantoprazole (PROTONIX) 40 MG tablet Take 1 tablet (40 mg total) by mouth daily. 10/07/20   Milton Ferguson, MD  QUEtiapine (SEROQUEL) 50 MG tablet Take 1 tablet (50 mg total) by mouth at bedtime.  07/29/18   Isla Pence, MD      Allergies    Blueberry flavor, Penicillins, Robaxin [methocarbamol], and Toradol [ketorolac tromethamine]    Review of Systems   Review of Systems  Constitutional:  Negative for chills and fever.  HENT:  Negative for sore throat.   Respiratory:  Negative for cough and shortness of breath.   Cardiovascular:  Negative for chest pain.  Gastrointestinal:  Positive for abdominal pain, constipation, nausea and vomiting.  Genitourinary:  Positive for difficulty urinating. Negative for dysuria, flank pain, frequency, hematuria and urgency.  Musculoskeletal:  Positive for back pain.  Negative for arthralgias.  Skin:  Negative for rash.  Neurological:  Negative for weakness, light-headedness and headaches.   Physical Exam Updated Vital Signs BP (!) 149/104    Pulse 82    Temp 98.5 F (36.9 C) (Oral)    Resp 20    SpO2 96%  Physical Exam Vitals and nursing note reviewed.  Constitutional:      General: He is not in acute distress.    Appearance: He is well-developed. He is obese.  HENT:     Head: Normocephalic and atraumatic.     Right Ear: External ear normal.     Left Ear: External ear normal.     Nose: Nose normal.     Mouth/Throat:     Pharynx: Oropharynx is clear.  Eyes:     Extraocular Movements: Extraocular movements intact.     Conjunctiva/sclera: Conjunctivae normal.     Pupils: Pupils are equal, round, and reactive to light.  Cardiovascular:     Rate and Rhythm: Normal rate and regular rhythm.     Heart sounds: Normal heart sounds. No murmur heard. Pulmonary:     Effort: Pulmonary effort is normal. No respiratory distress.     Breath sounds: Normal breath sounds.  Abdominal:     General: There is no distension.     Palpations: Abdomen is soft.     Tenderness: There is abdominal tenderness in the right lower quadrant. There is guarding (voluntary). There is no rebound.     Hernia: No hernia is present.  Musculoskeletal:        General: No swelling.     Cervical back: Neck supple.  Skin:    General: Skin is warm and dry.     Capillary Refill: Capillary refill takes less than 2 seconds.  Neurological:     General: No focal deficit present.     Mental Status: He is alert and oriented to person, place, and time.     Sensory: No sensory deficit.     Motor: No weakness.  Psychiatric:        Mood and Affect: Mood normal.    ED Results / Procedures / Treatments   Labs (all labs ordered are listed, but only abnormal results are displayed) Labs Reviewed  COMPREHENSIVE METABOLIC PANEL - Abnormal; Notable for the following components:      Result  Value   Sodium 134 (*)    Glucose, Bld 150 (*)    Calcium 8.8 (*)    All other components within normal limits  CBC - Abnormal; Notable for the following components:   WBC 14.1 (*)    All other components within normal limits  LIPASE, BLOOD    EKG None  Radiology CT ABDOMEN PELVIS W CONTRAST  Result Date: 03/04/2021 CLINICAL DATA:  Right lower quadrant pain. EXAM: CT ABDOMEN AND PELVIS WITH CONTRAST TECHNIQUE: Multidetector CT imaging of the abdomen and  pelvis was performed using the standard protocol following bolus administration of intravenous contrast. CONTRAST:  176mL OMNIPAQUE IOHEXOL 300 MG/ML  SOLN COMPARISON:  October 07, 2020 FINDINGS: Lower chest: No acute abnormality. Hepatobiliary: No focal liver abnormality is seen. No gallstones, gallbladder wall thickening, or biliary dilatation. Pancreas: Unremarkable. No pancreatic ductal dilatation or surrounding inflammatory changes. Spleen: Normal in size without focal abnormality. Adrenals/Urinary Tract: Adrenal glands are unremarkable. Kidneys are normal, without renal calculi or hydronephrosis. Multiple bilateral simple renal cysts are seen. The largest measures 6.9 cm x 5.8 cm and is located within the anterior aspect of the mid right kidney. Bladder is unremarkable. Stomach/Bowel: Stomach is within normal limits. Appendix appears normal. No evidence of bowel wall thickening, distention, or inflammatory changes. Noninflamed diverticula are seen within the transverse colon. Vascular/Lymphatic: No significant vascular findings are present. No enlarged abdominal or pelvic lymph nodes. Reproductive: Prostate is unremarkable. Other: No abdominal wall hernia or abnormality. No abdominopelvic ascites. Musculoskeletal: Multilevel degenerative changes are seen throughout the lumbar spine. IMPRESSION: 1. Colonic diverticulosis. 2. Multiple bilateral simple renal cysts. 3. Normal appendix. Electronically Signed   By: Virgina Norfolk M.D.   On:  03/04/2021 22:19    Procedures Procedures   Medications Ordered in ED Medications  iohexol (OMNIPAQUE) 300 MG/ML solution 100 mL (100 mLs Intravenous Contrast Given 03/04/21 2203)  fentaNYL (SUBLIMAZE) injection 50 mcg (50 mcg Intravenous Given 03/04/21 1841)  ketamine 50 mg in normal saline 5 mL (10 mg/mL) syringe (15 mg Intravenous Given 03/04/21 2142)    ED Course/ Medical Decision Making/ A&P                            Patient presents with right lower quadrant abdominal pain radiating into his lower back and right leg is described in HPI above.  Given severe right lower quadrant abdominal pain and leukocytosis presentation most concerning for appendicitis.  No obvious hernia on exam however the patient is tender in his right groin as well and that remains in the differential.  Patient has been more constipated than usual.  He has a history of right-sided sciatica, but states that this pain is different from that.  Lipase is normal and patient's abdominal pain is primarily in the lower abdomen, doubt pancreatitis.  Will obtain CT scan to evaluate for appendicitis and hernia.  Patient states that his pain gets worse when he lays down and is unable to tolerate CT scanner.  Patient was given a dose of fentanyl but said he still cannot tolerate laying flat.  The patient does have an ED care plan stating that he is excepted drug-seeking behavior in the past.  I had a discussion with the patient at bedside about the need to obtain the CT scan to further evaluate his condition.  We will administer pain dose ketamine to obtain the scan.  CT of the abdomen pelvis was obtained and there are no acute abnormalities in the abdomen or pelvis to explain the patient's symptoms.  Given normal imaging, I have low concern for emergent etiology of the patient's abdominal pain.  I recommended PCP follow-up if his symptoms do not improve.  Discharge instruction and return precautions were discussed with the patient  prior to discharge and included in AVS.  The patient voiced understanding of these instructions.  Patient was then discharged in stable condition.  Final Clinical Impression(s) / ED Diagnoses Final diagnoses:  Right lower quadrant abdominal pain    Rx /  DC Orders ED Discharge Orders     None         Rosie Golson, Amalia Hailey, MD 03/05/21 [place], DO 03/05/21 1517

## 2021-03-04 NOTE — ED Triage Notes (Signed)
Pt reports RLQ pain that radiates to back.  States symptoms started Christmas Eve when he went to go up steps and felt a pop in R abd.  Pain progressively worse.  Denies nausea, vomiting, and diarrhea.

## 2021-03-04 NOTE — ED Notes (Signed)
Results of tests reported/explained by EDP and plan of care .

## 2021-03-04 NOTE — ED Notes (Signed)
Pt was unable to tolerate CT. He states he was unable to lay flat d/t pain despite given IV Fent. Secure chat sent to Dr Adela Lank

## 2021-03-04 NOTE — Discharge Instructions (Addendum)
Your CT scan did not show any abnormalities in your abdomen or pelvis.  You may alternate taking Tylenol and ibuprofen every 3 hours as needed for your pain.  Never take more than 4000 mg of Tylenol in 1 day.  Follow-up with your primary care provider if your symptoms do not improve.

## 2021-03-04 NOTE — ED Notes (Addendum)
Pt updated that will will re-attempt CT scan for the 3rd time, giving pain medication. Pt agrees to plan. CT called and updated requested a call back when ready

## 2021-03-23 ENCOUNTER — Encounter (HOSPITAL_COMMUNITY): Payer: Self-pay | Admitting: *Deleted

## 2021-03-23 ENCOUNTER — Emergency Department (HOSPITAL_COMMUNITY): Payer: Self-pay

## 2021-03-23 ENCOUNTER — Inpatient Hospital Stay (HOSPITAL_COMMUNITY)
Admission: EM | Admit: 2021-03-23 | Discharge: 2021-03-28 | DRG: 864 | Disposition: A | Discharge status: Home / Self Care | Payer: Self-pay | Attending: Internal Medicine | Admitting: Internal Medicine

## 2021-03-23 ENCOUNTER — Other Ambulatory Visit: Payer: Self-pay

## 2021-03-23 DIAGNOSIS — F410 Panic disorder [episodic paroxysmal anxiety] without agoraphobia: Secondary | ICD-10-CM | POA: Diagnosis present

## 2021-03-23 DIAGNOSIS — G894 Chronic pain syndrome: Secondary | ICD-10-CM | POA: Diagnosis present

## 2021-03-23 DIAGNOSIS — F4310 Post-traumatic stress disorder, unspecified: Secondary | ICD-10-CM | POA: Diagnosis present

## 2021-03-23 DIAGNOSIS — R000 Tachycardia, unspecified: Secondary | ICD-10-CM | POA: Diagnosis present

## 2021-03-23 DIAGNOSIS — R52 Pain, unspecified: Secondary | ICD-10-CM

## 2021-03-23 DIAGNOSIS — Z91018 Allergy to other foods: Secondary | ICD-10-CM

## 2021-03-23 DIAGNOSIS — M48061 Spinal stenosis, lumbar region without neurogenic claudication: Secondary | ICD-10-CM | POA: Diagnosis present

## 2021-03-23 DIAGNOSIS — D72829 Elevated white blood cell count, unspecified: Secondary | ICD-10-CM | POA: Diagnosis present

## 2021-03-23 DIAGNOSIS — M5441 Lumbago with sciatica, right side: Secondary | ICD-10-CM | POA: Diagnosis present

## 2021-03-23 DIAGNOSIS — F419 Anxiety disorder, unspecified: Secondary | ICD-10-CM | POA: Diagnosis present

## 2021-03-23 DIAGNOSIS — F17210 Nicotine dependence, cigarettes, uncomplicated: Secondary | ICD-10-CM | POA: Diagnosis present

## 2021-03-23 DIAGNOSIS — Z885 Allergy status to narcotic agent status: Secondary | ICD-10-CM

## 2021-03-23 DIAGNOSIS — M5416 Radiculopathy, lumbar region: Secondary | ICD-10-CM | POA: Diagnosis present

## 2021-03-23 DIAGNOSIS — R079 Chest pain, unspecified: Secondary | ICD-10-CM

## 2021-03-23 DIAGNOSIS — Z8249 Family history of ischemic heart disease and other diseases of the circulatory system: Secondary | ICD-10-CM

## 2021-03-23 DIAGNOSIS — K219 Gastro-esophageal reflux disease without esophagitis: Secondary | ICD-10-CM | POA: Diagnosis present

## 2021-03-23 DIAGNOSIS — E669 Obesity, unspecified: Secondary | ICD-10-CM | POA: Diagnosis present

## 2021-03-23 DIAGNOSIS — Z79899 Other long term (current) drug therapy: Secondary | ICD-10-CM

## 2021-03-23 DIAGNOSIS — Z880 Allergy status to penicillin: Secondary | ICD-10-CM

## 2021-03-23 DIAGNOSIS — R319 Hematuria, unspecified: Secondary | ICD-10-CM | POA: Diagnosis present

## 2021-03-23 DIAGNOSIS — N179 Acute kidney failure, unspecified: Secondary | ICD-10-CM | POA: Diagnosis present

## 2021-03-23 DIAGNOSIS — Z20822 Contact with and (suspected) exposure to covid-19: Secondary | ICD-10-CM | POA: Diagnosis present

## 2021-03-23 DIAGNOSIS — R112 Nausea with vomiting, unspecified: Secondary | ICD-10-CM | POA: Diagnosis present

## 2021-03-23 DIAGNOSIS — Z83438 Family history of other disorder of lipoprotein metabolism and other lipidemia: Secondary | ICD-10-CM

## 2021-03-23 DIAGNOSIS — Z888 Allergy status to other drugs, medicaments and biological substances status: Secondary | ICD-10-CM

## 2021-03-23 DIAGNOSIS — H5350 Unspecified color vision deficiencies: Secondary | ICD-10-CM | POA: Diagnosis present

## 2021-03-23 DIAGNOSIS — Z6836 Body mass index (BMI) 36.0-36.9, adult: Secondary | ICD-10-CM

## 2021-03-23 DIAGNOSIS — D61818 Other pancytopenia: Secondary | ICD-10-CM

## 2021-03-23 DIAGNOSIS — G8929 Other chronic pain: Secondary | ICD-10-CM

## 2021-03-23 DIAGNOSIS — D72819 Decreased white blood cell count, unspecified: Secondary | ICD-10-CM

## 2021-03-23 DIAGNOSIS — K5900 Constipation, unspecified: Secondary | ICD-10-CM | POA: Diagnosis present

## 2021-03-23 DIAGNOSIS — F332 Major depressive disorder, recurrent severe without psychotic features: Secondary | ICD-10-CM | POA: Diagnosis present

## 2021-03-23 DIAGNOSIS — R42 Dizziness and giddiness: Secondary | ICD-10-CM | POA: Diagnosis present

## 2021-03-23 DIAGNOSIS — Z833 Family history of diabetes mellitus: Secondary | ICD-10-CM

## 2021-03-23 DIAGNOSIS — Z808 Family history of malignant neoplasm of other organs or systems: Secondary | ICD-10-CM

## 2021-03-23 DIAGNOSIS — I10 Essential (primary) hypertension: Secondary | ICD-10-CM | POA: Diagnosis present

## 2021-03-23 DIAGNOSIS — Z720 Tobacco use: Secondary | ICD-10-CM | POA: Diagnosis present

## 2021-03-23 DIAGNOSIS — R519 Headache, unspecified: Secondary | ICD-10-CM | POA: Diagnosis present

## 2021-03-23 DIAGNOSIS — Z86711 Personal history of pulmonary embolism: Secondary | ICD-10-CM

## 2021-03-23 DIAGNOSIS — M545 Low back pain, unspecified: Secondary | ICD-10-CM

## 2021-03-23 DIAGNOSIS — Z823 Family history of stroke: Secondary | ICD-10-CM

## 2021-03-23 DIAGNOSIS — R509 Fever, unspecified: Principal | ICD-10-CM | POA: Diagnosis present

## 2021-03-23 DIAGNOSIS — R310 Gross hematuria: Secondary | ICD-10-CM | POA: Diagnosis present

## 2021-03-23 DIAGNOSIS — I959 Hypotension, unspecified: Secondary | ICD-10-CM | POA: Diagnosis present

## 2021-03-23 DIAGNOSIS — R61 Generalized hyperhidrosis: Secondary | ICD-10-CM | POA: Diagnosis present

## 2021-03-23 LAB — URINALYSIS, ROUTINE W REFLEX MICROSCOPIC
Bilirubin Urine: NEGATIVE
Glucose, UA: NEGATIVE mg/dL
Ketones, ur: NEGATIVE mg/dL
Leukocytes,Ua: NEGATIVE
Nitrite: NEGATIVE
Protein, ur: 100 mg/dL — AB
Specific Gravity, Urine: 1.03 (ref 1.005–1.030)
pH: 5 (ref 5.0–8.0)

## 2021-03-23 LAB — CBC WITH DIFFERENTIAL/PLATELET
Abs Immature Granulocytes: 0.05 10*3/uL (ref 0.00–0.07)
Basophils Absolute: 0 10*3/uL (ref 0.0–0.1)
Basophils Relative: 0 %
Eosinophils Absolute: 0 10*3/uL (ref 0.0–0.5)
Eosinophils Relative: 0 %
HCT: 51.3 % (ref 39.0–52.0)
Hemoglobin: 16.6 g/dL (ref 13.0–17.0)
Immature Granulocytes: 1 %
Lymphocytes Relative: 6 %
Lymphs Abs: 0.7 10*3/uL (ref 0.7–4.0)
MCH: 29 pg (ref 26.0–34.0)
MCHC: 32.4 g/dL (ref 30.0–36.0)
MCV: 89.7 fL (ref 80.0–100.0)
Monocytes Absolute: 0.8 10*3/uL (ref 0.1–1.0)
Monocytes Relative: 7 %
Neutro Abs: 9.4 10*3/uL — ABNORMAL HIGH (ref 1.7–7.7)
Neutrophils Relative %: 86 %
Platelets: 108 10*3/uL — ABNORMAL LOW (ref 150–400)
RBC: 5.72 MIL/uL (ref 4.22–5.81)
RDW: 13.8 % (ref 11.5–15.5)
WBC: 10.9 10*3/uL — ABNORMAL HIGH (ref 4.0–10.5)
nRBC: 0 % (ref 0.0–0.2)

## 2021-03-23 LAB — COMPREHENSIVE METABOLIC PANEL
ALT: 34 U/L (ref 0–44)
AST: 28 U/L (ref 15–41)
Albumin: 3.8 g/dL (ref 3.5–5.0)
Alkaline Phosphatase: 93 U/L (ref 38–126)
Anion gap: 9 (ref 5–15)
BUN: 18 mg/dL (ref 6–20)
CO2: 28 mmol/L (ref 22–32)
Calcium: 8.6 mg/dL — ABNORMAL LOW (ref 8.9–10.3)
Chloride: 96 mmol/L — ABNORMAL LOW (ref 98–111)
Creatinine, Ser: 1.26 mg/dL — ABNORMAL HIGH (ref 0.61–1.24)
GFR, Estimated: >60 mL/min (ref >60)
Glucose, Bld: 132 mg/dL — ABNORMAL HIGH (ref 70–99)
Potassium: 3.7 mmol/L (ref 3.5–5.1)
Sodium: 133 mmol/L — ABNORMAL LOW (ref 135–145)
Total Bilirubin: 1.6 mg/dL — ABNORMAL HIGH (ref 0.3–1.2)
Total Protein: 7.6 g/dL (ref 6.5–8.1)

## 2021-03-23 LAB — RESP PANEL BY RT-PCR (FLU A&B, COVID) ARPGX2
Influenza A by PCR: NEGATIVE
Influenza B by PCR: NEGATIVE
SARS Coronavirus 2 by RT PCR: NEGATIVE

## 2021-03-23 MED ORDER — SODIUM CHLORIDE 0.9 % IV BOLUS
1000.0000 mL | Freq: Once | INTRAVENOUS | Status: AC
  Administered 2021-03-23: 1000 mL via INTRAVENOUS

## 2021-03-23 MED ORDER — HYDROMORPHONE HCL 1 MG/ML IJ SOLN
1.0000 mg | Freq: Once | INTRAMUSCULAR | Status: AC
Start: 2021-03-23 — End: 2021-03-23
  Administered 2021-03-23: 1 mg via INTRAVENOUS
  Filled 2021-03-23: qty 1

## 2021-03-23 MED ORDER — ONDANSETRON HCL 4 MG/2ML IJ SOLN
4.0000 mg | Freq: Once | INTRAMUSCULAR | Status: AC
  Administered 2021-03-23: 4 mg via INTRAVENOUS
  Filled 2021-03-23: qty 2

## 2021-03-23 MED ORDER — PIPERACILLIN-TAZOBACTAM 3.375 G IVPB
3.3750 g | INTRAVENOUS | Status: AC
  Administered 2021-03-23: 3.375 g via INTRAVENOUS
  Filled 2021-03-23: qty 50

## 2021-03-23 MED ORDER — CYCLOBENZAPRINE HCL 10 MG PO TABS
10.0000 mg | ORAL_TABLET | Freq: Once | ORAL | Status: AC
  Administered 2021-03-23: 10 mg via ORAL
  Filled 2021-03-23: qty 1

## 2021-03-23 MED ORDER — ACETAMINOPHEN 500 MG PO TABS
1000.0000 mg | ORAL_TABLET | Freq: Four times a day (QID) | ORAL | Status: DC | PRN
  Administered 2021-03-23 – 2021-03-24 (×2): 1000 mg via ORAL
  Filled 2021-03-23 (×2): qty 2

## 2021-03-23 MED ORDER — VANCOMYCIN HCL IN DEXTROSE 1-5 GM/200ML-% IV SOLN
1000.0000 mg | Freq: Once | INTRAVENOUS | Status: AC
  Administered 2021-03-24: 1000 mg via INTRAVENOUS
  Filled 2021-03-23: qty 200

## 2021-03-23 MED ORDER — ONDANSETRON 8 MG PO TBDP
8.0000 mg | ORAL_TABLET | Freq: Once | ORAL | Status: AC
  Administered 2021-03-23: 8 mg via ORAL
  Filled 2021-03-23: qty 1

## 2021-03-23 NOTE — ED Provider Notes (Signed)
Due to the [facility] Provider Note   CSN: [id number] Arrival date & time: 03/23/21  1731     History  No chief complaint on file.   Travis [name] is a 56 y.o. male with medical history significant for GERD, depression, degenerative disc disease, urinary incontinence, pulmonary embolus.  Patient states that for the last 3 weeks he has had bilateral lower back pain that he states comes and goes.  Patient describes the pain as a "pressure, a popping which lessens the pain and then builds back up".  Patient states he is attempted to address the pain utilizing ibuprofen with mild relief of pain.  Patient states he has been seen for the same complaint by his PCP and given Toradol shot, with mild relief of pain.  The patient denies any preceding trauma.  Patient states that he has chronic back pain.  The patient endorses fevers, back pain, nausea, vomiting, chills.  Patient denies chest pain, shortness of breath.  HPI     Home Medications Prior to Admission medications   Medication Sig Start Date End Date Taking? Authorizing Provider  acetaminophen (TYLENOL) 500 MG tablet Take 500 mg by mouth every 6 (six) hours as needed for mild pain or moderate pain.    [provider]  amLODipine (NORVASC) 10 MG tablet Take 10 mg by mouth daily. 08/17/20   [provider]  apixaban (ELIQUIS) 5 MG TABS tablet Take 2 tablets (10 mg total) by mouth 2 (two) times daily. Then 1 tablet (5 mg) two times daily starting 04/26/18 Patient not taking: Reported on 10/07/2020 04/20/18   Tat, Shanon Brow, MD  CLARITIN 10 MG tablet Take 10 mg by mouth daily as needed. 08/17/20   [provider]  cloNIDine (CATAPRES) 0.3 MG tablet Take 1 tablet (0.3 mg total) by mouth 2 (two) times daily for 30 days. Patient not taking: Reported on 10/07/2020 08/15/18 09/14/18  Noemi Chapel, MD  cyclobenzaprine (FLEXERIL) 10 MG tablet Take 10 mg by mouth 2 (two) times daily. 08/17/20   [provider]  famotidine (PEPCID) 20 MG tablet Take 20 mg by mouth daily as needed. 08/17/20   [provider]  gabapentin (NEURONTIN) 300 MG capsule Take 1 capsule (300 mg total) by mouth 2 (two) times daily. 09/06/18   Triplett, Tammy, PA-C  lidocaine (LIDODERM) 5 % Place 1 patch onto the skin daily. Remove & Discard patch within 12 hours or as directed by MD 03/02/21   Maudie Flakes, MD  losartan (COZAAR) 50 MG tablet Take 50 mg by mouth daily.    [provider]  metoprolol tartrate (LOPRESSOR) 100 MG tablet Take 100 mg by mouth 2 (two) times daily. 08/17/20   [provider]  metoprolol tartrate (LOPRESSOR) 50 MG tablet Take 1 tablet (50 mg total) by mouth 2 (two) times daily. Patient not taking: No sig reported 08/11/18   Dalia Heading, PA-C  ondansetron (ZOFRAN ODT) 4 MG disintegrating tablet 4mg  ODT q4 hours prn nausea/vomit 10/07/20   Milton Ferguson, MD  pantoprazole (PROTONIX) 40 MG tablet Take 1 tablet (40 mg total) by mouth daily. 10/07/20   Milton Ferguson, MD  QUEtiapine (SEROQUEL) 50 MG tablet Take 1 tablet (50 mg total) by mouth at bedtime. 07/29/18   Isla Pence, MD      Allergies    Blueberry flavor, Penicillins, Robaxin [methocarbamol], and Toradol [ketorolac tromethamine]    Review of Systems   Review of Systems  Constitutional:  Positive for chills and fever.  Respiratory:  Negative for shortness of breath.   Cardiovascular:  Negative for chest pain.  Gastrointestinal:  Positive for nausea and vomiting. Negative for abdominal pain.  Musculoskeletal:  Positive for back pain.  Neurological:  Negative for dizziness, light-headedness and headaches.  All other systems reviewed and are negative.  Physical Exam Updated Vital Signs BP 131/79    Pulse (!) 120    Temp 100.3 F (37.9 C) (Oral)    Resp (!) 24    SpO2 100%  Physical Exam Vitals and nursing note reviewed.  Constitutional:      General: He is not in acute distress.    Appearance: Normal  appearance. He is not ill-appearing, toxic-appearing or diaphoretic.  HENT:     Head: Normocephalic and atraumatic.     Nose: Nose normal.     Mouth/Throat:     Mouth: Mucous membranes are moist.  Eyes:     Extraocular Movements: Extraocular movements intact.     Pupils: Pupils are equal, round, and reactive to light.  Cardiovascular:     Rate and Rhythm: Normal rate and regular rhythm.  Pulmonary:     Effort: Pulmonary effort is normal.     Breath sounds: Normal breath sounds. No stridor. No wheezing, rhonchi or rales.  Abdominal:     General: Abdomen is flat.     Palpations: Abdomen is soft.     Tenderness: There is no abdominal tenderness.  Musculoskeletal:     Cervical back: Normal range of motion and neck supple. No rigidity or tenderness.  Skin:    General: Skin is warm and dry.     Capillary Refill: Capillary refill takes less than 2 seconds.  Neurological:     General: No focal deficit present.     Mental Status: He is alert and oriented to person, place, and time.     GCS: GCS eye subscore is 4. GCS verbal subscore is 5. GCS motor subscore is 6.     Cranial Nerves: Cranial nerves 2-12 are intact. No cranial nerve deficit or dysarthria.     Sensory: Sensation is intact. No sensory deficit.     Motor: Motor function is intact. No weakness or tremor.     Coordination: Coordination is intact. Finger-Nose-Finger Test and Heel to [facility] Test normal. Rapid alternating movements normal.    ED Results / Procedures / Treatments   Labs (all labs ordered are listed, but only abnormal results are displayed) Labs Reviewed  URINALYSIS, ROUTINE W REFLEX MICROSCOPIC - Abnormal; Notable for the following components:      Result Value   Color, Urine AMBER (*)    Hgb urine dipstick LARGE (*)    Protein, ur 100 (*)    Bacteria, UA RARE (*)    All other components within normal limits  CBC WITH DIFFERENTIAL/PLATELET - Abnormal; Notable for the following components:   WBC 10.9 (*)     Platelets 108 (*)    Neutro Abs 9.4 (*)    All other components within normal limits  COMPREHENSIVE METABOLIC PANEL - Abnormal; Notable for the following components:   Sodium 133 (*)    Chloride 96 (*)    Glucose, Bld 132 (*)    Creatinine, Ser 1.26 (*)    Calcium 8.6 (*)    Total Bilirubin 1.6 (*)    All other components within normal limits  RESP PANEL BY RT-PCR (FLU A&B, COVID) [id number]  URINE CULTURE    EKG EKG Interpretation  Date/Time:  Friday March 23 2021  19:49:09 EST Ventricular Rate:  94 PR Interval:  178 QRS Duration: 112 QT Interval:  338 QTC Calculation: 428 R Axis:   -9 Text Interpretation: Sinus rhythm Borderline intraventricular conduction delay ST elev, probable normal early repol pattern since last tracing, no significant changes Confirmed by Noemi Chapel [phone]) on 03/23/2021 8:22:15 PM  Radiology DG Chest Port 1 View  Result Date: 03/23/2021 CLINICAL DATA:  Fever x3 weeks. EXAM: PORTABLE CHEST 1 VIEW COMPARISON:  October 07, 2020 FINDINGS: The heart size and mediastinal contours are within normal limits. Very mild atelectatic changes seen within the left lung base. Both lungs are otherwise clear. The visualized skeletal structures are unremarkable. IMPRESSION: Very mild left basilar atelectasis. Electronically Signed   By: Virgina Norfolk M.D.   On: 03/23/2021 22:10    Procedures Procedures    Medications Ordered in ED Medications  acetaminophen (TYLENOL) tablet 1,000 mg (1,000 mg Oral Given 03/23/21 2139)  vancomycin (VANCOCIN) IVPB 1000 mg/200 mL premix (has no administration in time range)  piperacillin-tazobactam (ZOSYN) IVPB 3.375 g (3.375 g Intravenous New Bag/Given 03/23/21 2324)  cyclobenzaprine (FLEXERIL) tablet 10 mg (10 mg Oral Given 03/23/21 2117)  ondansetron (ZOFRAN-ODT) disintegrating tablet 8 mg (8 mg Oral Given 03/23/21 2155)  ondansetron (ZOFRAN) injection 4 mg (4 mg Intravenous Given 03/23/21 2229)  sodium chloride 0.9 % bolus 1,000 mL  (1,000 mLs Intravenous New Bag/Given 03/23/21 2301)  HYDROmorphone (DILAUDID) injection 1 mg (1 mg Intravenous Given 03/23/21 2323)    ED Course/ Medical Decision Making/ A&P                           Medical Decision Making Amount and/or Complexity of Data Reviewed Labs: ordered.  Risk OTC drugs. Prescription drug management.   56 year old male presents due to 3 weeks of lower bilateral back pain.  Patient has history of being seen an excessive amount of times at this ED and requesting specific prescription medication.  On examination, patient is sitting upright speaking in full sentences.  Patient is febrile here with a temperature of 101 Fahrenheit and reports he has had a T-max up to 103 Fahrenheit today.  Patient has history of substance abuse.  Patient volunteers that he "has urinary and bowel incontinence, groin numbness, lower extremity weakness".  Patient was able to spontaneously void when asked for urine sample and patient was also able to walk under his own power from his room to the bathroom to provide urine sample.  Patient neurological examination shows no focal deficits, no signs of weakness, 5 out of 5 strength bilaterally.  Patient has intact sensation to upper and lower extremities bilaterally.  Patient had recent CT scan of abdomen pelvis done on 03/04/2021 which did not show any signs of nephrolithiasis.  Due to the recent CT scan, I decided against ordering a CT renal stone study.  Lab work and imaging ordered and independently interpreted by me includes: CBC, CMP, respiratory panel, urinalysis, portable chest x-ray, EKG. CBC results show mildly increased white blood cell count of 10.9. Respiratory panel results are negative Urinalysis results show large hemoglobin in the urine, large amount of protein, rare bacteria.  This is indicative of dehydration most likely.  I have hung 1 L of fluid for the patient to rehydrate him. Patient chest x-ray interpreted by me shows no  signs of cardiopulmonary disease. EKG interpreted by me shows sinus rhythm Patient CMP results with slightly decreased sodium to 133, elevated creatinine 1.26, GFR >60  So far this patient's work-up is been largely unremarkable and unrevealing of the cause of his pain.  Due to this patient's low back pain, reported bowel/bladder dysfunction, lower extremity weakness, groin numbness, fever and history of drug use this patient needs an MRI to rule out any spinal abscess. The patient has had his MRI ordered. The facility we are currently at does not have access to an MRI machine.  This patient will need to be transferred to [facility] for MRI imaging.  We have contacted Dr. Sherwood Gambler who is one of the attending doctors working at the [facility] ED tonight.  We have advised Dr. Regenia Skeeter on this patient and his condition.  Dr. Regenia Skeeter has agreed to further evaluate and care for the patient once he has been transferred to [facility]. The patient is stable at time of transfer.   Final Clinical Impression(s) / ED Diagnoses Final diagnoses:  Fever, unspecified fever cause  Chronic bilateral low back pain without sciatica    Rx / DC Orders ED Discharge Orders     None         Lawana Chambers 03/23/21 2348    Noemi Chapel, MD 03/24/21 2243

## 2021-03-23 NOTE — ED Provider Notes (Signed)
This patient is a 56 year old male well-known to the emergency department for frequent emergency department visits, he was actually seen a couple of weeks ago with abdominal pain, he had back pain a couple of days before that and has had multiple pain related visits in the past.  He also carries a history of polysubstance abuse.  He presents to the hospital today with a complaint of back pain, states that it is been quite bad today, temperature as high as 103 at home, he has had some nausea, on exam the patient appears a little bit dehydrated he is febrile he is tachycardic and he has tenderness across his lower back.  He was able to ambulate back and forth to the bathroom but with some difficulty and had no postvoid residual.  A search for the cause of fever has not been found, he has no significant leukocytosis, no significant metabolic abnormalities, no findings on chest x-ray to suggest infection of the lungs and COVID and flu test are negative as well as a urinalysis.  We have requested transfer to [facility], ER for MRI to make sure he does not have epidural or spinal abscesses.  If this is negative I would anticipate the patient could be discharged home without any source of obvious fever and a remote unremarkable work-up however pathologic spinal cord findings will need to be evaluated first -I discussed the care with Dr. Sherlie Ban at the [facility] emergency department who has accepted transfer of this patient  Medical screening examination/treatment/procedure(s) were conducted as a shared visit with non-physician practitioner(s) and myself.  I personally evaluated the patient during the encounter.  Clinical Impression:   Final diagnoses:  Chest pain         Eber Hong, MD 03/23/21 2316

## 2021-03-23 NOTE — ED Triage Notes (Signed)
For the past 3 weeks he has had fever, sweats, chest pain and has been seen in the ED for several episodes

## 2021-03-23 NOTE — ED Notes (Signed)
Bladder scanned the patient was only a 99 ml

## 2021-03-24 ENCOUNTER — Emergency Department (HOSPITAL_COMMUNITY): Payer: Self-pay

## 2021-03-24 ENCOUNTER — Observation Stay (HOSPITAL_COMMUNITY): Payer: Self-pay

## 2021-03-24 ENCOUNTER — Encounter (HOSPITAL_COMMUNITY): Payer: Self-pay

## 2021-03-24 ENCOUNTER — Other Ambulatory Visit: Payer: Self-pay

## 2021-03-24 DIAGNOSIS — R509 Fever, unspecified: Secondary | ICD-10-CM | POA: Diagnosis present

## 2021-03-24 DIAGNOSIS — R319 Hematuria, unspecified: Secondary | ICD-10-CM | POA: Diagnosis present

## 2021-03-24 MED ORDER — ACETAMINOPHEN 500 MG PO TABS
1000.0000 mg | ORAL_TABLET | Freq: Four times a day (QID) | ORAL | Status: DC | PRN
  Administered 2021-03-24 – 2021-03-28 (×4): 1000 mg via ORAL
  Filled 2021-03-24 (×4): qty 2

## 2021-03-24 MED ORDER — GABAPENTIN 300 MG PO CAPS
600.0000 mg | ORAL_CAPSULE | Freq: Three times a day (TID) | ORAL | Status: DC
  Administered 2021-03-24 – 2021-03-28 (×14): 600 mg via ORAL
  Filled 2021-03-24 (×14): qty 2

## 2021-03-24 MED ORDER — HYDROMORPHONE HCL 1 MG/ML IJ SOLN
1.0000 mg | INTRAMUSCULAR | Status: DC | PRN
  Administered 2021-03-24 – 2021-03-28 (×19): 1 mg via INTRAVENOUS
  Filled 2021-03-24 (×19): qty 1

## 2021-03-24 MED ORDER — LORATADINE 10 MG PO TABS: 10.0000 mg | ORAL_TABLET | Freq: Every day | ORAL | Status: DC | PRN

## 2021-03-24 MED ORDER — SODIUM CHLORIDE 0.9 % IV BOLUS
1000.0000 mL | Freq: Once | INTRAVENOUS | Status: AC
  Administered 2021-03-24: 1000 mL via INTRAVENOUS

## 2021-03-24 MED ORDER — DEXTROSE-NACL 5-0.45 % IV SOLN: INTRAVENOUS | Status: DC

## 2021-03-24 MED ORDER — METOPROLOL TARTRATE 50 MG PO TABS
100.0000 mg | ORAL_TABLET | Freq: Two times a day (BID) | ORAL | Status: DC
  Administered 2021-03-24 – 2021-03-25 (×3): 100 mg via ORAL
  Filled 2021-03-24 (×2): qty 2
  Filled 2021-03-24: qty 4

## 2021-03-24 MED ORDER — QUETIAPINE FUMARATE 25 MG PO TABS
50.0000 mg | ORAL_TABLET | Freq: Two times a day (BID) | ORAL | Status: DC
  Administered 2021-03-24 – 2021-03-28 (×9): 50 mg via ORAL
  Filled 2021-03-24: qty 2
  Filled 2021-03-24: qty 1
  Filled 2021-03-24 (×7): qty 2
  Filled 2021-03-24: qty 1

## 2021-03-24 MED ORDER — IOHEXOL 350 MG/ML SOLN
100.0000 mL | Freq: Once | INTRAVENOUS | Status: AC | PRN
  Administered 2021-03-24: 100 mL via INTRAVENOUS

## 2021-03-24 MED ORDER — LOSARTAN POTASSIUM 50 MG PO TABS: 50.0000 mg | ORAL_TABLET | Freq: Every day | ORAL | Status: DC

## 2021-03-24 MED ORDER — LISINOPRIL 20 MG PO TABS
20.0000 mg | ORAL_TABLET | Freq: Every day | ORAL | Status: DC
  Filled 2021-03-24: qty 1

## 2021-03-24 MED ORDER — ENOXAPARIN SODIUM 40 MG/0.4ML IJ SOSY
40.0000 mg | PREFILLED_SYRINGE | INTRAMUSCULAR | Status: DC
  Administered 2021-03-24 – 2021-03-25 (×2): 40 mg via SUBCUTANEOUS
  Filled 2021-03-24 (×2): qty 0.4

## 2021-03-24 MED ORDER — GADOBUTROL 1 MMOL/ML IV SOLN
10.0000 mL | Freq: Once | INTRAVENOUS | Status: AC | PRN
  Administered 2021-03-24: 10 mL via INTRAVENOUS

## 2021-03-24 MED ORDER — HYDROMORPHONE HCL 1 MG/ML IJ SOLN
1.0000 mg | Freq: Once | INTRAMUSCULAR | Status: AC
  Administered 2021-03-24: 1 mg via INTRAVENOUS
  Filled 2021-03-24: qty 1

## 2021-03-24 MED ORDER — CYCLOBENZAPRINE HCL 10 MG PO TABS
10.0000 mg | ORAL_TABLET | Freq: Two times a day (BID) | ORAL | Status: DC
  Administered 2021-03-24 – 2021-03-28 (×9): 10 mg via ORAL
  Filled 2021-03-24 (×9): qty 1

## 2021-03-24 MED ORDER — ONDANSETRON 4 MG PO TBDP
4.0000 mg | ORAL_TABLET | ORAL | Status: DC | PRN
  Administered 2021-03-27 – 2021-03-28 (×4): 4 mg via ORAL
  Filled 2021-03-24 (×4): qty 1

## 2021-03-24 MED ORDER — PANTOPRAZOLE SODIUM 40 MG PO TBEC
40.0000 mg | DELAYED_RELEASE_TABLET | Freq: Every day | ORAL | Status: DC
  Administered 2021-03-24 – 2021-03-28 (×5): 40 mg via ORAL
  Filled 2021-03-24 (×5): qty 1

## 2021-03-24 NOTE — ED Notes (Signed)
Ok to drink per Dr. Sedonia Small, given cup of water

## 2021-03-24 NOTE — ED Notes (Signed)
Report given to ED charge nurse 

## 2021-03-24 NOTE — ED Notes (Signed)
Pt taken to MRI  

## 2021-03-24 NOTE — ED Notes (Signed)
Pt states he has missed 2 days of BID Metoprolol, remains febrile and tachycardic in 120-130s.

## 2021-03-24 NOTE — ED Provider Notes (Signed)
°  Provider Note MRN:  [id number]  Arrival date & time: 03/24/21    ED Course and Medical Decision Making  Assumed care from Dr. Hyacinth Meeker upon patient transfer  Back pain, fever, transferred for MRI to exclude cauda equina, epidural abscess.  MRI imaging is without infectious process.  Severe foraminal narrowing chronically.  Patient is febrile again 101.2, heart rate 130s despite liter of fluids.  History of drug abuse.  We will request admission for fever of unknown source, would consider echocardiogram to exclude vegetation.  Procedures  Final Clinical Impressions(s) / ED Diagnoses     ICD-10-CM   1. Fever, unspecified fever cause  R50.9     2. Chest pain  R07.9 DG Chest [facility] 1 View    DG Chest Port 1 View    3. Chronic bilateral low back pain without sciatica  M54.50    G89.29       ED Discharge Orders     None       Discharge Instructions   None     Elmer Sow. Pilar Plate, MD [facility] [email or web address]    Sabas Sous, MD 03/24/21 [phone]

## 2021-03-24 NOTE — ED Notes (Signed)
MD at bedside for eval of MS vs Tele bed

## 2021-03-24 NOTE — ED Triage Notes (Signed)
Pt arrived via [facility] from [facility] for MRI of r/o epidural abcess. HR remains 120's, temp 99.9. IV zosyn infusing on transfer. A&o4

## 2021-03-24 NOTE — ED Notes (Signed)
Pt given ginger ale per request

## 2021-03-24 NOTE — ED Notes (Signed)
First contact with pt, pt is at doorway, pulled off all equipment, states he "messed the bed". Pt has been hypotensive per last RN. Pt sat down onto chair in room. Pt denies dizziness, CP, SOB. A/ox4, pt states he has been having bilat lumbar pain with radiation and ABD pain with n/v/d for several days. Pt has pain on palpation to bilat lumbar and diffuse ABD. Pt requests bedside cammode to continue pooping. Pt has soft brown BM, very loose. Pt and sheets changed. Pt placed back into bed, monitor equiment applied. Pt given call light with instruction on use.
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
## 2021-03-24 NOTE — Plan of Care (Signed)
  Problem: Education: Goal: Knowledge of General Education information will improve Description: Including pain rating scale, medication(s)/side effects and non-pharmacologic comfort measures Outcome: Progressing   Problem: Coping: Goal: Level of anxiety will decrease Outcome: Progressing   

## 2021-03-25 ENCOUNTER — Inpatient Hospital Stay (HOSPITAL_COMMUNITY): Payer: Self-pay

## 2021-03-25 ENCOUNTER — Other Ambulatory Visit (HOSPITAL_COMMUNITY): Payer: Self-pay

## 2021-03-25 DIAGNOSIS — R509 Fever, unspecified: Secondary | ICD-10-CM

## 2021-03-25 DIAGNOSIS — R319 Hematuria, unspecified: Secondary | ICD-10-CM

## 2021-03-25 DIAGNOSIS — I10 Essential (primary) hypertension: Secondary | ICD-10-CM

## 2021-03-25 DIAGNOSIS — N179 Acute kidney failure, unspecified: Secondary | ICD-10-CM

## 2021-03-25 DIAGNOSIS — G894 Chronic pain syndrome: Secondary | ICD-10-CM

## 2021-03-25 LAB — BASIC METABOLIC PANEL
Anion gap: 9 (ref 5–15)
BUN: 15 mg/dL (ref 6–20)
CO2: 23 mmol/L (ref 22–32)
Calcium: 7.8 mg/dL — ABNORMAL LOW (ref 8.9–10.3)
Chloride: 103 mmol/L (ref 98–111)
Creatinine, Ser: 1.05 mg/dL (ref 0.61–1.24)
GFR, Estimated: >60 mL/min (ref >60)
Glucose, Bld: 144 mg/dL — ABNORMAL HIGH (ref 70–99)
Potassium: 3.6 mmol/L (ref 3.5–5.1)
Sodium: 135 mmol/L (ref 135–145)

## 2021-03-25 LAB — HIV ANTIBODY (ROUTINE TESTING W REFLEX): HIV Screen 4th Generation wRfx: NONREACTIVE

## 2021-03-25 LAB — ECHOCARDIOGRAM COMPLETE
AR max vel: 2.86 cm2
AV Area VTI: 2.75 cm2
AV Area mean vel: 2.7 cm2
AV Mean grad: 4 mmHg
AV Peak grad: 7.5 mmHg
Ao pk vel: 1.37 m/s
Area-P 1/2: 3.77 cm2
Height: 72 in
S' Lateral: 3.5 cm
Weight: 4289.27 oz

## 2021-03-25 LAB — CBC
HCT: 39.4 % (ref 39.0–52.0)
Hemoglobin: 12.9 g/dL — ABNORMAL LOW (ref 13.0–17.0)
MCH: 29.7 pg (ref 26.0–34.0)
MCHC: 32.7 g/dL (ref 30.0–36.0)
MCV: 90.6 fL (ref 80.0–100.0)
Platelets: UNDETERMINED 10*3/uL (ref 150–400)
RBC: 4.35 MIL/uL (ref 4.22–5.81)
RDW: 14 % (ref 11.5–15.5)
WBC: 3.1 10*3/uL — ABNORMAL LOW (ref 4.0–10.5)
nRBC: 0 % (ref 0.0–0.2)

## 2021-03-25 LAB — URINE CULTURE: Culture: NO GROWTH

## 2021-03-25 LAB — MRSA NEXT GEN BY PCR, NASAL: MRSA by PCR Next Gen: NOT DETECTED

## 2021-03-25 MED ORDER — ENOXAPARIN SODIUM 60 MG/0.6ML IJ SOSY
60.0000 mg | PREFILLED_SYRINGE | INTRAMUSCULAR | Status: DC
  Administered 2021-03-26 – 2021-03-28 (×3): 60 mg via SUBCUTANEOUS
  Filled 2021-03-25 (×3): qty 0.6

## 2021-03-25 MED ORDER — METOPROLOL TARTRATE 50 MG PO TABS: 100.0000 mg | ORAL_TABLET | Freq: Every day | ORAL | Status: DC

## 2021-03-25 MED ORDER — SODIUM CHLORIDE 0.9 % IV SOLN
2.0000 g | Freq: Three times a day (TID) | INTRAVENOUS | Status: DC
  Administered 2021-03-25 – 2021-03-27 (×6): 2 g via INTRAVENOUS
  Filled 2021-03-25 (×6): qty 2

## 2021-03-25 MED ORDER — METOPROLOL TARTRATE 50 MG PO TABS: 50.0000 mg | ORAL_TABLET | Freq: Two times a day (BID) | ORAL | Status: DC

## 2021-03-25 MED ORDER — PERFLUTREN LIPID MICROSPHERE
1.0000 mL | INTRAVENOUS | Status: AC | PRN
  Administered 2021-03-25: 2 mL via INTRAVENOUS
  Filled 2021-03-25: qty 10
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
## 2021-03-25 NOTE — Progress Notes (Addendum)
°  Echocardiogram 2D Echocardiogram with contrast has been performed.  Roosvelt Maser F 03/25/2021, 1:59 PM

## 2021-03-25 NOTE — Progress Notes (Signed)
Pharmacy Antibiotic Note  Travis [name] is a 56 y.o. male admitted on 03/23/2021 withback pain and fever, now sepsis.  Pharmacy has been consulted for Cefepime dosing. Patient was febrile, tachycardic, and hypotensive to 83/60. WBC 3.1, U/A negative. MRI negative for infectious process. Pt endorses having diarrhea day prior to admission.  Plan: Cefepime 2 g IV every 8 hours Monitor clinical progress, cultures/sensitivities, renal function, abx plan  Height: 6' (182.9 cm) Weight: 121.6 kg (268 lb 1.3 oz) IBW/kg (Calculated) : 77.6  Temp (24hrs), Avg:98.2 F (36.8 C), Min:97.7 F (36.5 C), Max:98.6 F (37 C)  Recent Labs  Lab 03/23/21 2226 03/25/21 0513 03/25/21 0724  WBC 10.9*  --  3.1*  CREATININE 1.26* 1.05  --     Estimated Creatinine Clearance: 109.6 mL/min (by C-G formula based on SCr of 1.05 mg/dL).    Allergies  Allergen Reactions   Blueberry Flavor Hives   Penicillins Hives, Itching and Other (See Comments)    Hallucinations. Has patient had a PCN reaction causing immediate rash, facial/tongue/throat swelling, SOB or lightheadedness with hypotension:YES Has patient had a PCN reaction causing severe rash involving mucus membranes or skin necrosis: NO Has patient had a PCN reaction that required hospitalization: yes Has patient had a PCN reaction occurring within the last 10 years: NO If all of the above answers are "NO", then may proceed with Cephalosporin use.   Robaxin [Methocarbamol] Palpitations   Toradol [Ketorolac Tromethamine] Hives and Other (See Comments)    Headache    Antimicrobials this admission: 1/22 Cefepime >> 1/21 Vanc x 1 1/20 Zosyn x 1  Dose adjustments this admission:  Microbiology results: 1/20 UCx negative 1/22 BCx:     Thank you for allowing [place] to participate in this patients care. Signe Colt, PharmD 03/25/2021 2:38 PM  **Pharmacist phone directory can be found on [email or web address] listed under [facility]**

## 2021-03-26 ENCOUNTER — Encounter (HOSPITAL_COMMUNITY): Payer: Self-pay | Admitting: Internal Medicine

## 2021-03-26 DIAGNOSIS — G8929 Other chronic pain: Secondary | ICD-10-CM

## 2021-03-26 DIAGNOSIS — R509 Fever, unspecified: Principal | ICD-10-CM

## 2021-03-26 DIAGNOSIS — K219 Gastro-esophageal reflux disease without esophagitis: Secondary | ICD-10-CM

## 2021-03-26 LAB — CBC
HCT: 38 % — ABNORMAL LOW (ref 39.0–52.0)
HCT: 41.2 % (ref 39.0–52.0)
Hemoglobin: 12.7 g/dL — ABNORMAL LOW (ref 13.0–17.0)
Hemoglobin: 13.5 g/dL (ref 13.0–17.0)
MCH: 29.5 pg (ref 26.0–34.0)
MCH: 29.2 pg (ref 26.0–34.0)
MCHC: 33.4 g/dL (ref 30.0–36.0)
MCHC: 32.8 g/dL (ref 30.0–36.0)
MCV: 88.4 fL (ref 80.0–100.0)
MCV: 89 fL (ref 80.0–100.0)
Platelets: 79 10*3/uL — ABNORMAL LOW (ref 150–400)
Platelets: 95 10*3/uL — ABNORMAL LOW (ref 150–400)
RBC: 4.3 MIL/uL (ref 4.22–5.81)
RBC: 4.63 MIL/uL (ref 4.22–5.81)
RDW: 13.8 % (ref 11.5–15.5)
RDW: 13.7 % (ref 11.5–15.5)
WBC: 2.5 10*3/uL — ABNORMAL LOW (ref 4.0–10.5)
WBC: 3 10*3/uL — ABNORMAL LOW (ref 4.0–10.5)
nRBC: 0 % (ref 0.0–0.2)
nRBC: 0 % (ref 0.0–0.2)

## 2021-03-26 LAB — GLUCOSE, CAPILLARY
Glucose-Capillary: 124 mg/dL — ABNORMAL HIGH (ref 70–99)
Glucose-Capillary: 125 mg/dL — ABNORMAL HIGH (ref 70–99)
Glucose-Capillary: 158 mg/dL — ABNORMAL HIGH (ref 70–99)
Glucose-Capillary: 151 mg/dL — ABNORMAL HIGH (ref 70–99)

## 2021-03-26 MED ORDER — METOPROLOL TARTRATE 50 MG PO TABS
50.0000 mg | ORAL_TABLET | Freq: Two times a day (BID) | ORAL | Status: DC
  Administered 2021-03-26 – 2021-03-27 (×3): 50 mg via ORAL
  Filled 2021-03-26 (×3): qty 1

## 2021-03-26 NOTE — Consult Note (Signed)
[facility]    Date of Admission:  03/23/2021     Total days of antibiotics 3   Cefepime 1/21              Reason for Consult: Fever, back pain    Referring Provider: Amery Primary Care Provider: Marliss Coots, NP   Assessment: Travis [name] is a 56 y.o. male admitted with fevers, acute on chronic back pain and diarrhea.    He has not had any further fevers since he was started on antibiotics 1/20 and continues on cefepime.  [place] normal aside from some fatty liver - no acute evidence of infection.  COVID/Flu PCR negative.  MRI T/L spine with severe degeneration at L4-5 and spinal stenosis with sciatic impingement at L5. This has worsened since last study. No evidence of infection on spine imaging. 2D Echo without any concern for endocarditis. Blood cultures remain negative though drawn after given a dose of vanc/zosyn. No localizing sources of infection at this time aside from diarrhea, which has resolved.  No weight loss but reports some night sweats that have recently started. Consider checking UDS - previously >3y ago with amphetamines, benzos. He assures me he is not using any drugs now.  Possible viral syndrome. His exam is reassuring as is most of the diagnostic information we have available.  Likely can stop IV antibiotics tomorrow if blood cultures continue to remain without growth.   General hepatitis panel for routine health maintenance.    Plan: Continue Cefepime for now Follow pending micro  Follow temp curve Hepatitis B/C screening in AM with labs.     Principal Problem:   Fever Active Problems:   GERD   Hypertension   Tobacco abuse   MDD (major depressive disorder), recurrent episode, severe (HCC)   Chronic pain syndrome   FUO (fever of unknown origin)   Hematuria    cyclobenzaprine  10 mg Oral BID   enoxaparin (LOVENOX) injection  60 mg Subcutaneous Q24H   gabapentin  600 mg Oral TID   metoprolol tartrate  50 mg  Oral BID   pantoprazole  40 mg Oral Daily   QUEtiapine  50 mg Oral BID    HPI: Travis [name] is a 56 y.o. male transferred to [facility] from [facility] with concern over acute vertebral infection in the setting of worsening pain and fevers.   Mr. Shockley tells me he first noticed worsening back pain about 1 month ago when he pulled himself across a doorway threshold and noticed a sudden onset of pain and "pop" sensation on the right side of his back. He did fall at this time per ER records due to the sudden pain. Since this time he has continued to have pain in the lower back and worsening pain down the right leg all the way down to his great toe. He initially received a IM shot of toradol and steroid with PCP but states his pain has progressively worsened since then.   In ER on 1/1 with right lower abdominal pain after he felt a popping sensation in his hip around Christmas. CT scan unremarkable for any acute finding; associated nausea and vomiting seemed to resolve at this time.   Back to [facility] ER on 1/20 for worsening back pain, diarrhea, and fevers at home to 103 F. In ER his temp was recorded 101.2 F. Much of his ROS is negative aside from some wheezing he experienced last night  at rest and initially at presentation had diarrhea that has since resolved. Also reports maybe some worsening of a chronic itchy rash on arms and legs. New spots popping up on arms last night. Noted to be hypotensive in ER with SBP < 90 after metoprolol and tachycardia He states he does not use IV drugs or other drugs; does not drink alcohol, does not smoke cigarettes.  All teeth has previously been removed.  Back pain and Rt sided sciatic pain continue to be rated 10/10 by him. Describes multiple joint aches/swelling over the last few years.   [place] normal aside from some fatty liver - no acute evidence of infection.  MRI T/L spine with severe degeneration at L4-5 and spinal stenosis with sciatic impingement at L5. This  has worsened since last study. No evidence of infection.  Blood cultures remain negative.    Review of Systems: Review of Systems  Constitutional:  Positive for diaphoresis and fever. Negative for chills, malaise/fatigue and weight loss.  HENT:  Negative for sore throat.        Edentulous  Eyes:  Negative for blurred vision.  Respiratory:  Positive for wheezing. Negative for cough and sputum production.   Cardiovascular:  Negative for chest pain and leg swelling.  Gastrointestinal:  Negative for abdominal pain, diarrhea and vomiting.  Genitourinary:  Negative for dysuria and flank pain.  Musculoskeletal:  Positive for back pain and joint pain (whole right leg from hip to toes). Negative for myalgias and neck pain.  Skin:  Positive for itching and rash.  Neurological:  Negative for dizziness, tingling and headaches.  Psychiatric/Behavioral:  Negative for depression and substance abuse. The patient is not nervous/anxious and does not have insomnia.    Past Medical History:  Diagnosis Date   Anxiety    Panic attacks   Chest pain    Hospital, March, 2014, negative enzymes, patient refused in-hospital  stress test, patient canceled outpatient stress test   Constipation    Degenerative disk disease    Degenerative disk disease    Depression    GERD (gastroesophageal reflux disease)    Hypertension    Incontinence of urine    Neuromuscular disorder (HCC)    Obesity    Polysubstance abuse ([place])    PTSD (post-traumatic stress disorder)    Pulmonary emboli (HCC)    Seizures (HCC)    Shortness of breath dyspnea    with exertion   Spinal stenosis    Spinal stenosis    Suicide attempt ([place])    Tachycardia - pulse    Tobacco abuse     Social History   Tobacco Use   Smoking status: Every Day    Packs/day: 0.50    Years: 0.00    Pack years: 0.00    Types: Cigarettes    Last attempt to quit: 12/12/2016    Years since quitting: 4.2   Smokeless tobacco: Never  Vaping Use    Vaping Use: Never used  Substance Use Topics   Alcohol use: No   Drug use: No    Family History  Problem Relation Age of Onset   Stroke Mother    Hypertension Mother    Hyperlipidemia Mother    Stroke Father    Hypertension Father    Dementia Father    Diabetes Father    Heart disease Father    Hyperlipidemia Father    Cancer Sister        brain   Diabetes Sister    Heart disease  Sister    Hyperlipidemia Sister    Hypertension Sister    Stroke Sister    Heart disease Brother    Hyperlipidemia Brother    Allergies  Allergen Reactions   Blueberry Flavor Hives   Penicillins Hives, Itching and Other (See Comments)    Hallucinations. Has patient had a PCN reaction causing immediate rash, facial/tongue/throat swelling, SOB or lightheadedness with hypotension:YES Has patient had a PCN reaction causing severe rash involving mucus membranes or skin necrosis: NO Has patient had a PCN reaction that required hospitalization: yes Has patient had a PCN reaction occurring within the last 10 years: NO If all of the above answers are "NO", then may proceed with Cephalosporin use.   Robaxin [Methocarbamol] Palpitations   Toradol [Ketorolac Tromethamine] Hives and Other (See Comments)    Headache    OBJECTIVE: Blood pressure 127/85, pulse 87, temperature 98.3 F (36.8 C), temperature source Oral, resp. rate 16, height 6' (1.829 m), weight 121.6 kg, SpO2 99 %.  Physical Exam Vitals reviewed.  Constitutional:      Appearance: He is well-developed.     Comments: Sitting up in bed, no distress.   HENT:     Mouth/Throat:     Dentition: Normal dentition. No dental abscesses.  Cardiovascular:     Rate and Rhythm: Normal rate and regular rhythm.     Heart sounds: Normal heart sounds.  Pulmonary:     Effort: Pulmonary effort is normal. No respiratory distress.     Breath sounds: Normal breath sounds. No decreased breath sounds, wheezing or rhonchi.  Abdominal:     General: There is no  distension.     Palpations: Abdomen is soft.     Tenderness: There is no abdominal tenderness. There is no guarding.  Musculoskeletal:     Right lower leg: Tenderness (TTP over anterior knee, right toe) present. No edema.     Left lower leg: No edema.  Lymphadenopathy:     Cervical: No cervical adenopathy.  Skin:    General: Skin is warm and dry.     Findings: Rash (scattered rash over arms and legs, papulopustular with linear streaking from scratching.) present.  Neurological:     Mental Status: He is alert and oriented to person, place, and time.  Psychiatric:        Judgment: Judgment normal.     Comments: In good spirits today and engaged in care discussion.        Lab Results Lab Results  Component Value Date   WBC 2.5 (L) 03/26/2021   HGB 12.7 (L) 03/26/2021   HCT 38.0 (L) 03/26/2021   MCV 88.4 03/26/2021   PLT 79 (L) 03/26/2021    Lab Results  Component Value Date   CREATININE 1.05 03/25/2021   BUN 15 03/25/2021   NA 135 03/25/2021   K 3.6 03/25/2021   CL 103 03/25/2021   CO2 23 03/25/2021    Lab Results  Component Value Date   ALT 34 03/23/2021   AST 28 03/23/2021   ALKPHOS 93 03/23/2021   BILITOT 1.6 (H) 03/23/2021     Microbiology: Recent Results (from the past 240 hour(s))  Urine Culture     Status: None   Collection Time: 03/23/21  9:00 PM   Specimen: Urine, Clean Catch  Result Value Ref Range Status   Specimen Description   Final    URINE, CLEAN CATCH Performed at [facility], [address], [place], [place] [address]    Special Requests   Final  NONE Performed at [facility], [address], [place], [place] [address]    Culture   Final    NO GROWTH Performed at [facility], [place] [address] [place], [place] [address]    Report Status 03/25/2021 FINAL  Final  Resp Panel by RT-PCR (Flu A&B, Covid) Nasopharyngeal Swab     Status: None   Collection Time: 03/23/21  9:13 PM   Specimen: Nasopharyngeal Swab; Nasopharyngeal(NP) swabs  in vial transport medium  Result Value Ref Range Status   SARS Coronavirus 2 by RT PCR NEGATIVE NEGATIVE Final    Comment: (NOTE) SARS-CoV-2 target nucleic acids are NOT DETECTED.  The SARS-CoV-2 RNA is generally detectable in upper respiratory specimens during the acute phase of infection. The lowest concentration of SARS-CoV-2 viral copies this assay can detect is 138 copies/mL. A negative result does not preclude SARS-Cov-2 infection and should not be used as the sole basis for treatment or other patient management decisions. A negative result may occur with  improper specimen collection/handling, submission of specimen other than nasopharyngeal swab, presence of viral mutation(s) within the areas targeted by this assay, and inadequate number of viral copies(<138 copies/mL). A negative result must be combined with clinical observations, patient history, and epidemiological information. The expected result is Negative.  Fact Sheet for Patients:  EntrepreneurPulse.com.au  Fact Sheet for Healthcare Providers:  IncredibleEmployment.be  This test is no t yet approved or cleared by the [place] FDA and  has been authorized for detection and/or diagnosis of SARS-CoV-2 by FDA under an Emergency Use Authorization (EUA). This EUA will remain  in effect (meaning this test can be used) for the duration of the COVID-19 declaration under Section 564(b)(1) of the Act, 21 U.S.C.section 360bbb-3(b)(1), unless the authorization is terminated  or revoked sooner.       Influenza A by PCR NEGATIVE NEGATIVE Final   Influenza B by PCR NEGATIVE NEGATIVE Final    Comment: (NOTE) The Xpert Xpress SARS-CoV-2/FLU/RSV plus assay is intended as an aid in the diagnosis of influenza from Nasopharyngeal swab specimens and should not be used as a sole basis for treatment. Nasal washings and aspirates are unacceptable for Xpert Xpress  SARS-CoV-2/FLU/RSV testing.  Fact Sheet for Patients: EntrepreneurPulse.com.au  Fact Sheet for Healthcare Providers: IncredibleEmployment.be  This test is not yet approved or cleared by the [place] FDA and has been authorized for detection and/or diagnosis of SARS-CoV-2 by FDA under an Emergency Use Authorization (EUA). This EUA will remain in effect (meaning this test can be used) for the duration of the COVID-19 declaration under Section 564(b)(1) of the Act, 21 U.S.C. section 360bbb-3(b)(1), unless the authorization is terminated or revoked.  Performed at [facility], [address], [place], [place] [address]   MRSA Next Gen by PCR, Nasal     Status: None   Collection Time: 03/25/21  7:20 AM   Specimen: Nasal Mucosa; Nasal Swab  Result Value Ref Range Status   MRSA by PCR Next Gen NOT DETECTED NOT DETECTED Final    Comment: (NOTE) The GeneXpert MRSA Assay (FDA approved for NASAL specimens only), is one component of a comprehensive MRSA colonization surveillance program. It is not intended to diagnose MRSA infection nor to guide or monitor treatment for MRSA infections. Test performance is not FDA approved in patients less than 77 years old. Performed at [facility], [place] [address], [place], [place] [address]   Culture, blood (routine x 2)     Status: None (Preliminary result)  Collection Time: 03/25/21 10:21 AM   Specimen: BLOOD RIGHT HAND  Result Value Ref Range Status   Specimen Description BLOOD RIGHT HAND  Final   Special Requests   Final    BOTTLES DRAWN AEROBIC ONLY Blood Culture adequate volume   Culture   Final    NO GROWTH < 24 HOURS Performed at [facility], [address] [place], [place] [address]    Report Status PENDING  Incomplete  Culture, blood (routine x 2)     Status: None (Preliminary result)   Collection Time: 03/25/21 10:24 AM   Specimen: BLOOD LEFT HAND  Result Value Ref Range Status    Specimen Description BLOOD LEFT HAND  Final   Special Requests   Final    BOTTLES DRAWN AEROBIC ONLY Blood Culture results may not be optimal due to an inadequate volume of blood received in culture bottles   Culture   Final    NO GROWTH < 24 HOURS Performed at [facility], [place] [address], [place], [place] [address]    Report Status PENDING  Incomplete    Janene Madeira, MSN, NP-C [facility] [place] Pager: [phone]  03/26/2021 12:03 PM

## 2021-03-26 NOTE — Plan of Care (Signed)

## 2021-03-26 NOTE — Consult Note (Addendum)
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                 recent 04/20/2018. Risk Factors:Current Smoker.  Sonographer:    Merrie Roof RDCS Referring Phys: [place]  1. Left ventricular ejection fraction, by estimation, is 60 to 65%. The left ventricle has normal function. The left ventricle has no regional wall motion abnormalities. There is mild concentric left ventricular hypertrophy. Left ventricular diastolic parameters are consistent with Grade I diastolic dysfunction (impaired relaxation).  2. Right ventricular systolic function is normal. The right ventricular size is normal.  3. Left atrial size was mildly dilated.  4. The mitral valve is normal in structure. No evidence of mitral valve regurgitation. No evidence of mitral stenosis.  5. The aortic valve is tricuspid. Aortic valve regurgitation is  not visualized. No aortic stenosis is present.  6. Aortic dilatation noted. There is mild dilatation of the aortic root, measuring 38 mm. FINDINGS  Left Ventricle: Left ventricular ejection fraction, by estimation, is 60 to 65%. The left ventricle has normal function. The left ventricle has no regional wall motion abnormalities. Definity contrast agent was given IV to  delineate the left ventricular  endocardial borders. The left ventricular internal cavity size was normal in size. There is mild concentric left ventricular hypertrophy. Left ventricular diastolic parameters are consistent with Grade I diastolic dysfunction (impaired relaxation). Normal left ventricular filling pressure. Right Ventricle: The right ventricular size is normal. No increase in right ventricular wall thickness. Right ventricular systolic function is normal. Left Atrium: Left atrial size was mildly dilated. Right Atrium: Right atrial size was normal in size. Pericardium: There is no evidence of pericardial effusion. Mitral Valve: The mitral valve is normal in structure. No evidence of mitral valve regurgitation. No evidence of mitral valve stenosis. Tricuspid Valve: The tricuspid valve is normal in structure. Tricuspid valve regurgitation is not demonstrated. No evidence of tricuspid stenosis. Aortic Valve: The aortic valve is tricuspid. Aortic valve regurgitation is not visualized. No aortic stenosis is present. Aortic valve mean gradient measures 4.0 mmHg. Aortic valve peak gradient measures 7.5 mmHg. Aortic valve area, by VTI measures 2.75 cm. Pulmonic Valve: The pulmonic valve was normal in structure. Pulmonic valve regurgitation is not visualized. No evidence of pulmonic stenosis. Aorta: Aortic dilatation noted. There is mild dilatation of the aortic root, measuring 38 mm. IAS/Shunts: No atrial level shunt detected by color flow Doppler.  LEFT VENTRICLE PLAX 2D LVIDd:         4.70 cm   Diastology LVIDs:         3.50 cm   LV e' medial:    8.16 cm/s LV PW:         1.20 cm   LV E/e' medial:  9.2 LV IVS:        1.20 cm   LV e' lateral:   8.92 cm/s LVOT diam:     2.20 cm   LV E/e' lateral: 8.4 LV SV:         72 LV SV Index:   30 LVOT Area:     3.80 cm  LEFT ATRIUM             Index LA diam:        4.20 cm 1.74 cm/m LA Vol (A2C):   73.5 ml 30.46 ml/m LA Vol (A4C):   79.3 ml 32.86 ml/m LA Biplane Vol:  77.9 ml 32.28 ml/m  AORTIC VALVE AV Area (Vmax):    2.86 cm AV Area (Vmean):   2.70 cm AV Area (VTI):     2.75 cm AV Vmax:           137.00 cm/s AV Vmean:          93.000 cm/s AV VTI:            0.263 m AV Peak Grad:      7.5 mmHg AV Mean Grad:      4.0 mmHg LVOT Vmax:         103.00 cm/s LVOT Vmean:        66.000 cm/s LVOT VTI:          0.190 m LVOT/AV VTI ratio: 0.72  AORTA Ao Root diam: 3.85 cm MITRAL VALVE MV Area (PHT): 3.77 cm    SHUNTS MV Decel Time: 201 msec  Systemic VTI:  0.19 m MV E velocity: 74.90 cm/s  Systemic Diam: 2.20 cm MV A velocity: 72.80 cm/s MV E/A ratio:  1.03 Skeet Latch MD Electronically signed by Skeet Latch MD Signature Date/Time: 03/25/2021/3:27:02 PM    Final    US Abdomen Limited RUQ (LIVER/GB)  Result Date: 03/25/2021 CLINICAL DATA:  Pain EXAM: ULTRASOUND ABDOMEN LIMITED RIGHT UPPER QUADRANT COMPARISON:  None. FINDINGS: Gallbladder: No gallstones or wall thickening visualized. No sonographic Murphy sign noted by sonographer. Common bile duct: Diameter: 7 mm Liver: No focal lesion identified. Increased parenchymal echogenicity. Portal vein is patent on color Doppler imaging with normal direction of blood flow towards the liver. Other: Incidental simple, benign exophytic cyst of the superior pole of the right kidney measuring 6.2 cm. IMPRESSION: 1. No ultrasound abnormality of the right upper quadrant to explain pain. Consider CT or MRI to further evaluate otherwise unexplained abdominal pain. 2.  Hepatic steatosis. Electronically Signed   By: Delanna Ahmadi M.D.   On: 03/25/2021 17:39     ASSESSMENT AND PLAN:  1.  Pancytopenia, improving 2.  Fever of unclear etiology 3.  Hypertension 4.  Chronic pain  This is a 56 year old male who presented with back pain and fever.  Fevers quickly resolved with initiation of antibiotics.  Blood cultures negative to date.  CBC was normal on admission with the exception of leukocytosis.  However, he has developed pancytopenia  about day 2 of his hospitalization.  Repeat CBC compared to earlier today already improving.  His pancytopenia appears to be acute process given that he had normal CBCs prior to this admission.  CBC drawn this afternoon is already improving.  I suspect pancytopenia is in part dilutional and may be related to medications that he has been receiving this hospitalization and possibly an element of bone marrow suppression from infection.  For now, recommend close monitoring with a daily CBC with differential.  No additional work-up recommended at this time.  Thank you for this referral.  Mikey Bussing, DNP, AGPCNP-BC, AOCNP   Attending Note  I personally saw the patient, reviewed the chart and examined the patient. The plan of care was discussed with the patient and the admitting team. I agree with the assessment and plan as documented above. Thank you very much for the consultation. He has chronic thrombocytopenia upon review and there appears to be platelet clumps so I am not sure if the platelet count is accurate. With respect to WBC, this appears to be improving and Hb is within normal range. At this time there is no clear etiology for his thrombocytopenia, it appears to be mild overall and chronic and hence can be investigated outpatient. I have ordered some additional labs to follow. We will continue to monitor him periodically.
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
## 2021-03-27 DIAGNOSIS — D61818 Other pancytopenia: Secondary | ICD-10-CM

## 2021-03-27 DIAGNOSIS — D72819 Decreased white blood cell count, unspecified: Secondary | ICD-10-CM

## 2021-03-27 LAB — CBC WITH DIFFERENTIAL/PLATELET
Abs Immature Granulocytes: 0 10*3/uL (ref 0.00–0.07)
Basophils Absolute: 0 10*3/uL (ref 0.0–0.1)
Basophils Relative: 1 %
Eosinophils Absolute: 0.1 10*3/uL (ref 0.0–0.5)
Eosinophils Relative: 3 %
HCT: 40.1 % (ref 39.0–52.0)
Hemoglobin: 13.5 g/dL (ref 13.0–17.0)
Immature Granulocytes: 0 %
Lymphocytes Relative: 40 %
Lymphs Abs: 1.4 10*3/uL (ref 0.7–4.0)
MCH: 30.1 pg (ref 26.0–34.0)
MCHC: 33.7 g/dL (ref 30.0–36.0)
MCV: 89.5 fL (ref 80.0–100.0)
Monocytes Absolute: 0.4 10*3/uL (ref 0.1–1.0)
Monocytes Relative: 11 %
Neutro Abs: 1.6 10*3/uL — ABNORMAL LOW (ref 1.7–7.7)
Neutrophils Relative %: 45 %
Platelets: 97 10*3/uL — ABNORMAL LOW (ref 150–400)
RBC: 4.48 MIL/uL (ref 4.22–5.81)
RDW: 13.7 % (ref 11.5–15.5)
WBC: 3.6 10*3/uL — ABNORMAL LOW (ref 4.0–10.5)
nRBC: 0 % (ref 0.0–0.2)

## 2021-03-27 LAB — LACTATE DEHYDROGENASE: LDH: 183 U/L (ref 98–192)

## 2021-03-27 LAB — HEPATITIS PANEL, ACUTE
HCV Ab: NONREACTIVE
Hep A IgM: NONREACTIVE
Hep B C IgM: NONREACTIVE
Hepatitis B Surface Ag: NONREACTIVE

## 2021-03-27 LAB — IRON AND TIBC
Iron: 79 ug/dL (ref 45–182)
Saturation Ratios: 30 % (ref 17.9–39.5)
TIBC: 266 ug/dL (ref 250–450)
UIBC: 187 ug/dL

## 2021-03-27 LAB — PROTIME-INR
INR: 1.1 (ref 0.8–1.2)
Prothrombin Time: 14 seconds (ref 11.4–15.2)

## 2021-03-27 LAB — RETICULOCYTES
Immature Retic Fract: 9.4 % (ref 2.3–15.9)
RBC.: 4.51 MIL/uL (ref 4.22–5.81)
Retic Count, Absolute: 51.9 10*3/uL (ref 19.0–186.0)
Retic Ct Pct: 1.2 % (ref 0.4–3.1)

## 2021-03-27 LAB — VITAMIN B12: Vitamin B-12: 284 pg/mL (ref 180–914)

## 2021-03-27 LAB — APTT: aPTT: 31 seconds (ref 24–36)

## 2021-03-27 MED ORDER — METOPROLOL TARTRATE 50 MG PO TABS
100.0000 mg | ORAL_TABLET | Freq: Two times a day (BID) | ORAL | Status: DC
  Administered 2021-03-27 – 2021-03-28 (×2): 100 mg via ORAL
  Filled 2021-03-27 (×2): qty 2

## 2021-03-27 NOTE — Progress Notes (Signed)
[place] for Infectious Disease  Date of Admission:  03/23/2021      Total days of antibiotics 3         ASSESSMENT: Travis [name] is a 55 y.o. male transferred from [facility] with concern over acute discitis with worsening back pain and high fevers. MRI has been negative for any infectious process - notable deterioration in the degenerative disease and now L5 radiculopathy. Abd U/S unremarkable. CTA negative. Presume this is a viral process for him. Will stop ceftriaxone now. Can go home with planned outpatient follow up in AM if remains afebrile and cell lines continue to show stability/improvement.  D/W Dr. Kurtis Bushman at the bedside   Pancytopenia - improving. Hematology following. All anemia work up has been unrevealing at this time and suspect reactive in the setting of viral infection. Follow up arranged with heme team in 4 weeks to follow.   Gen Health Maintenance - hepatitis serology all negative. HIV non reactive  PLAN: Stop ceftriaxone  Home tomorrow suspected if no fevers  / stable labs.    Principal Problem:   Fever Active Problems:   GERD   Hypertension   Tobacco abuse   MDD (major depressive disorder), recurrent episode, severe (HCC)   Chronic pain syndrome   FUO (fever of unknown origin)   Hematuria    cyclobenzaprine  10 mg Oral BID   enoxaparin (LOVENOX) injection  60 mg Subcutaneous Q24H   gabapentin  600 mg Oral TID   metoprolol tartrate  100 mg Oral BID   pantoprazole  40 mg Oral Daily   QUEtiapine  50 mg Oral BID    SUBJECTIVE: He says he feels better and nearly ready to go home.    Review of Systems: Review of Systems  Constitutional:  Negative for chills, fever, malaise/fatigue and weight loss.  HENT:  Negative for sore throat.        No dental problems  Respiratory:  Negative for cough and sputum production.   Cardiovascular:  Negative for chest pain and leg swelling.  Gastrointestinal:  Negative for abdominal pain,  diarrhea and vomiting.  Genitourinary:  Negative for dysuria and flank pain.  Musculoskeletal:  Positive for back pain and joint pain (posterior leg pain). Negative for myalgias and neck pain.  Skin:  Negative for rash.  Neurological:  Negative for dizziness, tingling and headaches.  Psychiatric/Behavioral:  Negative for depression and substance abuse. The patient is not nervous/anxious and does not have insomnia.      Allergies  Allergen Reactions   Blueberry Flavor Hives   Penicillins Hives, Itching and Other (See Comments)    Hallucinations. Has patient had a PCN reaction causing immediate rash, facial/tongue/throat swelling, SOB or lightheadedness with hypotension:YES Has patient had a PCN reaction causing severe rash involving mucus membranes or skin necrosis: NO Has patient had a PCN reaction that required hospitalization: yes Has patient had a PCN reaction occurring within the last 10 years: NO If all of the above answers are "NO", then may proceed with Cephalosporin use. * Zosyn x 1 03/24/21. No rxn noted.   Robaxin [Methocarbamol] Palpitations   Toradol [Ketorolac Tromethamine] Hives and Other (See Comments)    Headache    OBJECTIVE: Vitals:   03/27/21 0048 03/27/21 0335 03/27/21 0736 03/27/21 1212  BP: (!) 143/99 (!) 148/91 (!) 145/93 (!) 140/98  Pulse: 78 74 71 80  Resp: 16 14  18   Temp: 98.5 F (36.9 C) 98.4 F (36.9  C) 98.2 F (36.8 C) 98 F (36.7 C)  TempSrc: Oral Axillary    SpO2: 97% 97% 98% 99%  Weight:      Height:       Body mass index is 36.36 kg/m.  Physical Exam Vitals reviewed.  Constitutional:      Appearance: He is well-developed.     Comments: Resting comfortably in the bed. No distress.   HENT:     Mouth/Throat:     Dentition: Normal dentition. No dental abscesses.  Cardiovascular:     Rate and Rhythm: Normal rate and regular rhythm.     Heart sounds: Normal heart sounds.  Pulmonary:     Effort: Pulmonary effort is normal.     Breath  sounds: Normal breath sounds.  Abdominal:     General: There is no distension.     Palpations: Abdomen is soft.     Tenderness: There is no abdominal tenderness.  Lymphadenopathy:     Cervical: No cervical adenopathy.  Skin:    General: Skin is warm and dry.     Findings: No rash.  Neurological:     Mental Status: He is alert and oriented to person, place, and time.  Psychiatric:        Judgment: Judgment normal.    Lab Results Lab Results  Component Value Date   WBC 3.6 (L) 03/27/2021   HGB 13.5 03/27/2021   HCT 40.1 03/27/2021   MCV 89.5 03/27/2021   PLT 97 (L) 03/27/2021    Lab Results  Component Value Date   CREATININE 1.05 03/25/2021   BUN 15 03/25/2021   NA 135 03/25/2021   K 3.6 03/25/2021   CL 103 03/25/2021   CO2 23 03/25/2021    Lab Results  Component Value Date   ALT 34 03/23/2021   AST 28 03/23/2021   ALKPHOS 93 03/23/2021   BILITOT 1.6 (H) 03/23/2021     Microbiology: Recent Results (from the past 240 hour(s))  Urine Culture     Status: None   Collection Time: 03/23/21  9:00 PM   Specimen: Urine, Clean Catch  Result Value Ref Range Status   Specimen Description   Final    URINE, CLEAN CATCH Performed at [facility], [address], [place], [place] [address]    Special Requests   Final    NONE Performed at [facility], [address] [place], [place] [address]    Culture   Final    NO GROWTH Performed at [facility], [place] [address], [place], [place] [address]    Report Status 03/25/2021 FINAL  Final  Resp Panel by RT-PCR (Flu A&B, Covid) Nasopharyngeal Swab     Status: None   Collection Time: 03/23/21  9:13 PM   Specimen: Nasopharyngeal Swab; Nasopharyngeal(NP) swabs in vial transport medium  Result Value Ref Range Status   SARS Coronavirus 2 by RT PCR NEGATIVE NEGATIVE Final    Comment: (NOTE) SARS-CoV-2 target nucleic acids are NOT DETECTED.  The SARS-CoV-2 RNA is generally detectable in upper respiratory specimens during  the acute phase of infection. The lowest concentration of SARS-CoV-2 viral copies this assay can detect is 138 copies/mL. A negative result does not preclude SARS-Cov-2 infection and should not be used as the sole basis for treatment or other patient management decisions. A negative result may occur with  improper specimen collection/handling, submission of specimen other than nasopharyngeal swab, presence of viral mutation(s) within the areas targeted by this assay, and inadequate number of viral  copies(<138 copies/mL). A negative result must be combined with clinical observations, patient history, and epidemiological information. The expected result is Negative.  Fact Sheet for Patients:  EntrepreneurPulse.com.au  Fact Sheet for Healthcare Providers:  IncredibleEmployment.be  This test is no t yet approved or cleared by the [place] FDA and  has been authorized for detection and/or diagnosis of SARS-CoV-2 by FDA under an Emergency Use Authorization (EUA). This EUA will remain  in effect (meaning this test can be used) for the duration of the COVID-19 declaration under Section 564(b)(1) of the Act, 21 U.S.C.section 360bbb-3(b)(1), unless the authorization is terminated  or revoked sooner.       Influenza A by PCR NEGATIVE NEGATIVE Final   Influenza B by PCR NEGATIVE NEGATIVE Final    Comment: (NOTE) The Xpert Xpress SARS-CoV-2/FLU/RSV plus assay is intended as an aid in the diagnosis of influenza from Nasopharyngeal swab specimens and should not be used as a sole basis for treatment. Nasal washings and aspirates are unacceptable for Xpert Xpress SARS-CoV-2/FLU/RSV testing.  Fact Sheet for Patients: EntrepreneurPulse.com.au  Fact Sheet for Healthcare Providers: IncredibleEmployment.be  This test is not yet approved or cleared by the [place] FDA and has been authorized for detection and/or  diagnosis of SARS-CoV-2 by FDA under an Emergency Use Authorization (EUA). This EUA will remain in effect (meaning this test can be used) for the duration of the COVID-19 declaration under Section 564(b)(1) of the Act, 21 U.S.C. section 360bbb-3(b)(1), unless the authorization is terminated or revoked.  Performed at [facility], [address] [place], [place] [address]   MRSA Next Gen by PCR, Nasal     Status: None   Collection Time: 03/25/21  7:20 AM   Specimen: Nasal Mucosa; Nasal Swab  Result Value Ref Range Status   MRSA by PCR Next Gen NOT DETECTED NOT DETECTED Final    Comment: (NOTE) The GeneXpert MRSA Assay (FDA approved for NASAL specimens only), is one component of a comprehensive MRSA colonization surveillance program. It is not intended to diagnose MRSA infection nor to guide or monitor treatment for MRSA infections. Test performance is not FDA approved in patients less than 58 years old. Performed at [facility], [place] [address], [place], [place] [address]   Culture, blood (routine x 2)     Status: None (Preliminary result)   Collection Time: 03/25/21 10:21 AM   Specimen: BLOOD RIGHT HAND  Result Value Ref Range Status   Specimen Description BLOOD RIGHT HAND  Final   Special Requests   Final    BOTTLES DRAWN AEROBIC ONLY Blood Culture adequate volume   Culture   Final    NO GROWTH 2 DAYS Performed at [facility], [place] [address] [place], [place] [address]    Report Status PENDING  Incomplete  Culture, blood (routine x 2)     Status: None (Preliminary result)   Collection Time: 03/25/21 10:24 AM   Specimen: BLOOD LEFT HAND  Result Value Ref Range Status   Specimen Description BLOOD LEFT HAND  Final   Special Requests   Final    BOTTLES DRAWN AEROBIC ONLY Blood Culture results may not be optimal due to an inadequate volume of blood received in culture bottles   Culture   Final    NO GROWTH 2 DAYS Performed at [facility], [place] [address] [place],  [address]    Report Status PENDING  Incomplete    Janene Madeira, MSN, NP-C [place] for  Infectious Disease [facility] Pager: [phone] 03/27/2021  2:30 PM

## 2021-03-27 NOTE — Progress Notes (Signed)
Labs from this morning have been reviewed.  His WBC and platelets continue to slowly improve.  His hemoglobin is normal today.  He has no evidence of iron or B12 deficiency.  Folate RBC pending.  Reticulocytes and LDH normal.  We will arrange for outpatient follow-up at the cancer center in approximately 4 weeks to recheck his CBC.  At this time, hematology will sign off.  Please reconsult if needed.  Clenton Pare, DNP, AGPCNP-BC, AOCNP

## 2021-03-27 NOTE — Plan of Care (Signed)

## 2021-03-27 NOTE — TOC Initial Note (Signed)
Transition of Care [facility]) - Initial/Assessment Note    Patient Details  Name: Travis [name] MRN: [id number] Date of Birth: [birth year]  [facility] CM/SW Contact:    Sharin Mons, RN Phone Number: 03/27/2021, 9:55 AM  Clinical Narrative:                 Presented with back pain and fever. From home with supportive sister  Anderson [place].  PTA independent with ADL's .Use cane with ambulation. States applied for Medicaid a week ago.  PT pending....  TOC team will monitoring and will assist with needs.  1/24  1020 Per PT's assessment pt can benefit from outpatient PT. Pt agreeable to services. Referral made for outpatient PT and noted on AVS. NCM made pt aware and noted on AVS.Pt instructed to call and arrange appointment time.  Referral made with [employer] for cane under charity care. Kasandra Knudsen will be delivered to besidre prior to d/c.   Expected Discharge Plan: Home/Self Care (resides with sister)     Patient Goals and CMS Choice        Expected Discharge Plan and Services Expected Discharge Plan: Home/Self Care (resides with sister)   Discharge Planning Services: CM Consult   Living arrangements for the past 2 months: Single Family Home                                      Prior Living Arrangements/Services Living arrangements for the past 2 months: Single Family Home Lives with:: Siblings Patient language and need for interpreter reviewed:: Yes Do you feel safe going back to the place where you live?: Yes      Need for Family Participation in Patient Care: Yes (Comment) Care giver support system in place?: Yes (comment) Current home services: DME (cane) Criminal Activity/Legal Involvement Pertinent to Current Situation/Hospitalization: Yes - Comment as needed  Activities of Daily Living Home Assistive Devices/Equipment: None ADL Screening (condition at time of admission) Patient's cognitive ability adequate to safely complete daily activities?:  Yes Is the patient deaf or have difficulty hearing?: No Does the patient have difficulty seeing, even when wearing glasses/contacts?: No Does the patient have difficulty concentrating, remembering, or making decisions?: No Patient able to express need for assistance with ADLs?: Yes Does the patient have difficulty dressing or bathing?: No Independently performs ADLs?: Yes (appropriate for developmental age) Does the patient have difficulty walking or climbing stairs?: No Weakness of Legs: None Weakness of Arms/Hands: None  Permission Sought/Granted      Share Information with NAME: [name] (Sister) [phone]           Emotional Assessment Appearance:: Appears stated age Attitude/Demeanor/Rapport: Engaged Affect (typically observed): Accepting Orientation: : Oriented to Self, Oriented to Place, Oriented to  Time, Oriented to Situation Alcohol / Substance Use: Other (comment) (vapor, 3-4x per day) Psych Involvement: No (comment)  Admission diagnosis:  Fever [R50.9] Chest pain [R07.9] Fever, unspecified fever cause [R50.9] Chronic bilateral low back pain without sciatica [M54.50, G89.29] Patient Active Problem List   Diagnosis Date Noted   FUO (fever of unknown origin) 03/24/2021   Fever 03/24/2021   Hematuria 03/24/2021   Pulmonary embolism ([place]) 04/17/2018   Bradycardia, drug induced 03/01/2018   Hyperglycemia 03/01/2018   Clonidine overdose 03/01/2018   ARF (acute renal failure) ([place]) 09/01/2015   Hypotension 09/01/2015   Acute encephalopathy 09/01/2015   Hematochezia 04/07/2014   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  Major depressive disorder, single episode, moderate ([place]) 10/12/2013   Morbid obesity ([place]) 10/12/2013   Essential hypertension 10/12/2013   Chronic pain syndrome 10/12/2013   MDD (major depressive disorder), recurrent episode, severe ([place]) 09/29/2013   Rectal pain 08/10/2013   Tachycardia 07/27/2013   Urine incontinence 07/27/2013    Rectal bleeding 07/27/2013   Left arm pain 03/29/2013   Lumbar radicular pain 03/23/2013   Bilateral shoulder pain 10/27/2012   Smoking 10/27/2012   Dental abscess 07/14/2012   Hypertension    Anxiety    Spinal stenosis    Tobacco abuse    Obesity    Dyslipidemia    Polysubstance abuse ([place])    Chest pain    Grief 06/11/2012   Panic attack 01/21/2012   HYPERCHOLESTEROLEMIA 02/18/2006   DEPRESSION 02/18/2006   GERD 02/18/2006   HEADACHE 02/18/2006   PCP:  Marliss Coots, NP Pharmacy:   [id number] [place], [address] [address] [address] Phone: [phone] Fax: [phone]     Social Determinants of Health (SDOH) Interventions    Readmission Risk Interventions No flowsheet data found.

## 2021-03-27 NOTE — Evaluation (Signed)
Physical Therapy Evaluation Patient Details Name: Travis [name] MRN: [id number] DOB: [birth year] Today's Date: 03/27/2021  History of Present Illness  Pt is a 56 y/o male admitted secondary to ongoing fever and back pain. Workup pending. Imaging negative. PMH includes PTSD, MDD, HTN, PE, and tobacco use.  Clinical Impression  Pt admitted secondary to problem above with deficits below. Pt requiring min guard A for mobility tasks within the room. Pain limited further mobility. Educated about using cane at home and about exercises, however, pt reports he could not attempt secondary to pain. Feel pt would benefit from outpatient PT to address current deficits. Will continue to follow acutely.        Recommendations for follow up therapy are one component of a multi-disciplinary discharge planning process, led by the attending physician.  Recommendations may be updated based on patient status, additional functional criteria and insurance authorization.  Follow Up Recommendations Outpatient PT    Assistance Recommended at Discharge Intermittent Supervision/Assistance  Patient can return home with the following       Equipment Recommendations Cane  Recommendations for Other Services       Functional Status Assessment Patient has had a recent decline in their functional status and demonstrates the ability to make significant improvements in function in a reasonable and predictable amount of time.     Precautions / Restrictions Precautions Precautions: Fall Restrictions Weight Bearing Restrictions: No      Mobility  Bed Mobility Overal bed mobility: Needs Assistance Bed Mobility: Supine to Sit, Sit to Supine     Supine to sit: Supervision, HOB elevated Sit to supine: Supervision   General bed mobility comments: Educated about log roll, but pt reports it hurts too much to roll.    Transfers Overall transfer level: Needs assistance Equipment used: None Transfers: Sit to/from  Stand Sit to Stand: Min guard           General transfer comment: Min guard for safety. Increased time and effort    Ambulation/Gait Ambulation/Gait assistance: Min guard Gait Distance (Feet): 25 Feet Assistive device: None Gait Pattern/deviations: Step-through pattern, Decreased stride length, Decreased weight shift to left, Antalgic Gait velocity: Decreased     General Gait Details: Slow, antalgic gait. Min guard for safety. Pt wanting to stay in room secondary to pain. Educated about use of cane for ambulation to increase safety.  Stairs            Wheelchair Mobility    Modified Rankin (Stroke Patients Only)       Balance Overall balance assessment: Needs assistance Sitting-balance support: No upper extremity supported, Feet supported Sitting balance-Leahy Scale: Good     Standing balance support: No upper extremity supported Standing balance-Leahy Scale: Fair                               Pertinent Vitals/Pain Pain Assessment Pain Assessment: Faces Faces Pain Scale: Hurts even more Pain Location: LLE and back Pain Descriptors / Indicators: Grimacing, Guarding Pain Intervention(s): Limited activity within patient's tolerance, Monitored during session, Repositioned    Home Living Family/patient expects to be discharged to:: Private residence Living Arrangements: Other relatives (sister) Available Help at Discharge: Family Type of Home: House Home Access: Ramped entrance       Home Layout: One level Home Equipment: Advice worker (2 wheels)      Prior Function Prior Level of Function : Independent/Modified Independent  Mobility Comments: furniture walks normally secondary to pain ADLs Comments: Reports independence. 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 Reports back issues for "years"  Communication   Communication: No difficulties  Cognition Arousal/Alertness: Awake/alert Behavior During Therapy: WFL for tasks assessed/performed Overall Cognitive Status: No family/caregiver present to determine baseline cognitive functioning                                          General Comments      Exercises Other Exercises Other Exercises: Educated about lumbar extension/flexion exercises and core tightening to help with pain management.   Assessment/Plan    PT Assessment Patient needs continued PT services  PT Problem List Decreased strength;Decreased activity tolerance;Decreased balance;Decreased mobility;Pain       PT Treatment Interventions DME instruction;Gait training;Functional mobility training;Therapeutic exercise;Therapeutic activities;Balance training;Patient/family education    PT Goals (Current goals can be found in the Care Plan section)  Acute Rehab PT Goals Patient Stated Goal: to decrease pain PT Goal Formulation: With patient Time For Goal Achievement: 04/10/21 Potential to Achieve Goals: Fair    Frequency Min 3X/week     Co-evaluation               AM-PAC PT "6 Clicks" Mobility  Outcome Measure Help needed turning from your back to your side while in a flat bed without using bedrails?: A Little Help needed moving from lying on your back to sitting on the side of a flat bed without using bedrails?: A Little Help needed moving to and from a bed to a chair (including a wheelchair)?: A Little Help needed standing up from a chair using your arms (e.g., wheelchair or bedside chair)?: A Little Help needed to walk in hospital room?: A Little Help  needed climbing 3-5 steps with a railing? : A Lot 6 Click Score: 17    End of Session Equipment Utilized During Treatment: Gait belt Activity Tolerance: Patient limited by pain Patient left: in bed;with call bell/phone within reach Nurse Communication: Mobility status PT Visit Diagnosis: Other abnormalities of gait and mobility (R26.89);Pain Pain - Right/Left: Left Pain - part of body: Leg (back)    Time: [id number] PT Time Calculation (min) (ACUTE ONLY): 15 min   Charges:   PT Evaluation $PT Eval Low Complexity: 1 Low          Lou Miner, DPT  Acute Rehabilitation Services  Pager: [phone] Office: [phone]   Rudean Hitt 03/27/2021, 10:14 AM

## 2021-03-27 NOTE — Progress Notes (Signed)
Oncology Discharge Planning Admission Note  [facility] at [facility] Address: [address] [place], [place], [name] [address] Hours of Operation:  8am - 5pm, Monday - Friday  Clinic Contact Information:  [phone]) [phone]  Oncology Care Team: Medical Oncologist:  Dr. Al Pimple  Dr. Al Pimple is aware of this hospital admission dated 03/24/21 and has been assessed at bedside by she and Jenell Milliner, NP. The cancer center will follow Travis [name] inpatient care to assist with discharge planning as indicated by the oncologist.  We will reach out closer to discharge date to arrange follow up care.  Disclaimer:  This Cancer Center nursing note does not imply a formal consult request has been made by the admitting attending for this admission or there will be an inpatient consult completed by oncology.  Please request oncology consults as per standard process as indicated.
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
## 2021-03-28 LAB — CBC
HCT: 39.8 % (ref 39.0–52.0)
Hemoglobin: 12.9 g/dL — ABNORMAL LOW (ref 13.0–17.0)
MCH: 28.9 pg (ref 26.0–34.0)
MCHC: 32.4 g/dL (ref 30.0–36.0)
MCV: 89.2 fL (ref 80.0–100.0)
Platelets: 104 10*3/uL — ABNORMAL LOW (ref 150–400)
RBC: 4.46 MIL/uL (ref 4.22–5.81)
RDW: 13.5 % (ref 11.5–15.5)
WBC: 4.6 10*3/uL (ref 4.0–10.5)
nRBC: 0 % (ref 0.0–0.2)

## 2021-03-28 LAB — FOLATE RBC
Folate, Hemolysate: 254 ng/mL
Folate, RBC: 626 ng/mL (ref >498)
Hematocrit: 40.6 % (ref 37.5–51.0)

## 2021-03-28 MED ORDER — GABAPENTIN 300 MG PO CAPS: 600.0000 mg | ORAL_CAPSULE | Freq: Three times a day (TID) | ORAL | 0 refills | Status: DC

## 2021-03-28 MED ORDER — ONDANSETRON 4 MG PO TBDP: 4.0000 mg | ORAL_TABLET | ORAL | 0 refills | Status: DC | PRN

## 2021-03-28 NOTE — Discharge Summary (Signed)
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 VTI ratio: 0.72  AORTA Ao Root diam: 3.85 cm MITRAL VALVE MV Area (PHT): 3.77 cm    SHUNTS MV Decel Time: 201 msec    Systemic VTI:  0.19 m MV E velocity: 74.90 cm/s  Systemic Diam: 2.20 cm MV A velocity: 72.80 cm/s MV E/A ratio:  1.03 Skeet Latch MD Electronically signed by Skeet Latch MD Signature Date/Time: 03/25/2021/3:27:02 PM    Final    US Abdomen Limited RUQ (LIVER/GB)  Result Date: 03/25/2021 CLINICAL DATA:  Pain EXAM: ULTRASOUND ABDOMEN LIMITED RIGHT UPPER QUADRANT COMPARISON:  None. FINDINGS: Gallbladder: No gallstones or wall thickening visualized. No sonographic Murphy sign noted by sonographer. Common bile duct: Diameter: 7 mm Liver: No focal lesion identified. Increased parenchymal echogenicity. Portal vein is patent on color Doppler imaging with normal direction of blood flow towards the liver. Other: Incidental simple, benign exophytic cyst of the superior pole of the right kidney measuring 6.2 cm. IMPRESSION: 1. No ultrasound abnormality of the right upper  quadrant to explain pain. Consider CT or MRI to further evaluate otherwise unexplained abdominal pain. 2.  Hepatic steatosis. Electronically Signed   By: Delanna Ahmadi M.D.   On: 03/25/2021 17:39     Subjective: Acute issues or events overnight  continues to complain of nausea and abdominal pain nonspecific, requesting "stronger medications at discharge" which we discussed would need to come through his PCP given his chronic pain   Discharge Exam: Vitals:   03/28/21 0831 03/28/21 1401  BP: (!) 148/97 (!) 134/100  Pulse: 82 81  Resp: 20 19  Temp: 98.7 F (37.1 C) 98.3 F (36.8 C)  SpO2:  99%   Vitals:   03/28/21 0005 03/28/21 0407 03/28/21 0831 03/28/21 1401  BP:   (!) 148/97 (!) 134/100  Pulse: 76 80 82 81  Resp:   20 19  Temp: 97.8 F (36.6 C) 98.6 F (37 C) 98.7 F (37.1 C) 98.3 F (36.8 C)  TempSrc: Oral Oral Oral Oral  SpO2: 97% 100%  99%  Weight:      Height:        General: Pt is alert, awake, not in acute distress Cardiovascular: RRR, S1/S2 +, no rubs, no gallops Respiratory: CTA bilaterally, no wheezing, no rhonchi Abdominal: Soft, NT, ND, bowel sounds + Extremities: no edema, no cyanosis    The results of significant diagnostics from this hospitalization (including imaging, microbiology, ancillary and laboratory) are listed below for reference.     Microbiology: Recent Results (from the past 240 hour(s))  Urine Culture     Status: None   Collection Time: 03/23/21  9:00 PM   Specimen: Urine, Clean Catch  Result Value Ref Range Status   Specimen Description   Final    URINE, CLEAN CATCH Performed at [facility], [address] [place], [place] [address]    Special Requests   Final    NONE Performed at [facility], [address] [place], [place] [address]    Culture   Final    NO GROWTH Performed at [facility], [place] [address] [place], [place] [address]    Report Status 03/25/2021 FINAL  Final  Resp Panel by RT-PCR (Flu A&B, Covid)  Nasopharyngeal Swab     Status: None   Collection Time: 03/23/21  9:13 PM   Specimen: Nasopharyngeal Swab; Nasopharyngeal(NP) swabs in vial transport medium  Result Value Ref Range Status   SARS Coronavirus 2 by RT PCR NEGATIVE NEGATIVE Final    Comment: (NOTE) SARS-CoV-2 target nucleic acids are NOT DETECTED.  The SARS-CoV-2 RNA is generally detectable in upper respiratory specimens during the acute phase of infection. The lowest concentration of SARS-CoV-2 viral copies this assay can detect is 138 copies/mL. A negative result does not preclude SARS-Cov-2 infection and should not be used as the sole basis for treatment or other patient management decisions. A negative result may occur with  improper specimen collection/handling, submission of specimen other than nasopharyngeal swab, presence of viral mutation(s) within the areas targeted by this assay, and inadequate number of viral copies(<138 copies/mL). A negative result must be combined with clinical observations, patient history, and epidemiological information. The expected result is Negative.  Fact Sheet for Patients:  EntrepreneurPulse.com.au  Fact Sheet for Healthcare Providers:  IncredibleEmployment.be  This test is no t yet approved or cleared by the [place] FDA and  has been authorized for detection and/or diagnosis of SARS-CoV-2 by FDA under an Emergency Use Authorization (EUA). This EUA will remain  in effect (meaning this test can be used) for the duration of the COVID-19 declaration under Section 564(b)(1) of the Act, 21 U.S.C.section 360bbb-3(b)(1), unless the authorization is terminated  or revoked sooner.       Influenza A by PCR NEGATIVE NEGATIVE Final  Influenza B by PCR NEGATIVE NEGATIVE Final    Comment: (NOTE) The Xpert Xpress SARS-CoV-2/FLU/RSV plus assay is intended as an aid in the diagnosis of influenza from Nasopharyngeal swab specimens and should not be  used as a sole basis for treatment. Nasal washings and aspirates are unacceptable for Xpert Xpress SARS-CoV-2/FLU/RSV testing.  Fact Sheet for Patients: EntrepreneurPulse.com.au  Fact Sheet for Healthcare Providers: IncredibleEmployment.be  This test is not yet approved or cleared by the [place] FDA and has been authorized for detection and/or diagnosis of SARS-CoV-2 by FDA under an Emergency Use Authorization (EUA). This EUA will remain in effect (meaning this test can be used) for the duration of the COVID-19 declaration under Section 564(b)(1) of the Act, 21 U.S.C. section 360bbb-3(b)(1), unless the authorization is terminated or revoked.  Performed at [facility], [address] [place], [place] [address]   MRSA Next Gen by PCR, Nasal     Status: None   Collection Time: 03/25/21  7:20 AM   Specimen: Nasal Mucosa; Nasal Swab  Result Value Ref Range Status   MRSA by PCR Next Gen NOT DETECTED NOT DETECTED Final    Comment: (NOTE) The GeneXpert MRSA Assay (FDA approved for NASAL specimens only), is one component of a comprehensive MRSA colonization surveillance program. It is not intended to diagnose MRSA infection nor to guide or monitor treatment for MRSA infections. Test performance is not FDA approved in patients less than 18 years old. Performed at [facility], [place] [address], [place], [place] [address]   Culture, blood (routine x 2)     Status: None (Preliminary result)   Collection Time: 03/25/21 10:21 AM   Specimen: BLOOD RIGHT HAND  Result Value Ref Range Status   Specimen Description BLOOD RIGHT HAND  Final   Special Requests   Final    BOTTLES DRAWN AEROBIC ONLY Blood Culture adequate volume   Culture   Final    NO GROWTH 3 DAYS Performed at [facility], [place] [address], [place], [place] [address]    Report Status PENDING  Incomplete  Culture, blood (routine x 2)     Status: None (Preliminary result)    Collection Time: 03/25/21 10:24 AM   Specimen: BLOOD LEFT HAND  Result Value Ref Range Status   Specimen Description BLOOD LEFT HAND  Final   Special Requests   Final    BOTTLES DRAWN AEROBIC ONLY Blood Culture results may not be optimal due to an inadequate volume of blood received in culture bottles   Culture   Final    NO GROWTH 3 DAYS Performed at [facility], [place] [address] [place], [place] [address]    Report Status PENDING  Incomplete     Labs: BNP (last 3 results) No results for input(s): BNP in the last 8760 hours. Basic Metabolic Panel: Recent Labs  Lab 03/23/21 2226 03/25/21 0513  NA 133* 135  K 3.7 3.6  CL 96* 103  CO2 28 23  GLUCOSE 132* 144*  BUN 18 15  CREATININE 1.26* 1.05  CALCIUM 8.6* 7.8*    Liver Function Tests: Recent Labs  Lab 03/23/21 2226  AST 28  ALT 34  ALKPHOS 93  BILITOT 1.6*  PROT 7.6  ALBUMIN 3.8    No results for input(s): LIPASE, AMYLASE in the last 168 hours. No results for input(s): AMMONIA in the last 168 hours. CBC: Recent Labs  Lab 03/23/21 2226 03/25/21 [id number] 03/26/21 0450 03/26/21 1235 03/27/21 0247 03/28/21 [id number]  WBC 10.9* 3.1* 2.5* 3.0* 3.6* 4.6  NEUTROABS 9.4*  --   --   --  1.6*  --   HGB 16.6 12.9* 12.7* 13.5 13.5 12.9*  HCT 51.3 39.4 38.0* 41.2 40.1   40.6 39.8  MCV 89.7 90.6 88.4 89.0 89.5 89.2  PLT 108* PLATELET CLUMPS NOTED ON SMEAR, UNABLE TO ESTIMATE 79* 95* 97* 104*    Cardiac Enzymes: No results for input(s): CKTOTAL, CKMB, CKMBINDEX, TROPONINI in the last 168 hours. BNP: Invalid input(s): POCBNP CBG: Recent Labs  Lab 03/26/21 0816 03/26/21 1128 03/26/21 1617 03/26/21 1945  GLUCAP 124* 125* 158* 151*    D-Dimer No results for input(s): DDIMER in the last 72 hours. Hgb A1c No results for input(s): HGBA1C in the last 72 hours. Lipid Profile No results for input(s): CHOL, HDL, LDLCALC, TRIG, CHOLHDL, LDLDIRECT in the last 72 hours. Thyroid function studies No results for  input(s): TSH, T4TOTAL, T3FREE, THYROIDAB in the last 72 hours.  Invalid input(s): FREET3 Anemia work up Recent Labs    03/27/21 0247  VITAMINB12 284  TIBC 266  IRON 79  RETICCTPCT 1.2    Urinalysis    Component Value Date/Time   COLORURINE AMBER (A) 03/23/2021 2053   APPEARANCEUR CLEAR 03/23/2021 2053   LABSPEC 1.030 03/23/2021 2053   PHURINE 5.0 03/23/2021 2053   GLUCOSEU NEGATIVE 03/23/2021 2053   GLUCOSEU NEG mg/dL 11/25/2007 0047   HGBUR LARGE (A) 03/23/2021 2053   BILIRUBINUR NEGATIVE 03/23/2021 2053   BILIRUBINUR SMALL 08/16/2013 1351   [place] 03/23/2021 2053   PROTEINUR 100 (A) 03/23/2021 2053   UROBILINOGEN 0.2 04/05/2014 0127   NITRITE NEGATIVE 03/23/2021 2053   LEUKOCYTESUR NEGATIVE 03/23/2021 2053   Sepsis Labs Invalid input(s): PROCALCITONIN,  WBC,  LACTICIDVEN Microbiology Recent Results (from the past 240 hour(s))  Urine Culture     Status: None   Collection Time: 03/23/21  9:00 PM   Specimen: Urine, Clean Catch  Result Value Ref Range Status   Specimen Description   Final    URINE, CLEAN CATCH Performed at [facility], [address], [place], [place] [address]    Special Requests   Final    NONE Performed at [facility], [address] [place], [place] [address]    Culture   Final    NO GROWTH Performed at [facility], [place] [address], [place], [place] [address]    Report Status 03/25/2021 FINAL  Final  Resp Panel by RT-PCR (Flu A&B, Covid) Nasopharyngeal Swab     Status: None   Collection Time: 03/23/21  9:13 PM   Specimen: Nasopharyngeal Swab; Nasopharyngeal(NP) swabs in vial transport medium  Result Value Ref Range Status   SARS Coronavirus 2 by RT PCR NEGATIVE NEGATIVE Final    Comment: (NOTE) SARS-CoV-2 target nucleic acids are NOT DETECTED.  The SARS-CoV-2 RNA is generally detectable in upper respiratory specimens during the acute phase of infection. The lowest concentration of SARS-CoV-2 viral copies this assay  can detect is 138 copies/mL. A negative result does not preclude SARS-Cov-2 infection and should not be used as the sole basis for treatment or other patient management decisions. A negative result may occur with  improper specimen collection/handling, submission of specimen other than nasopharyngeal swab, presence of viral mutation(s) within the areas targeted by this assay, and inadequate number of viral copies(<138 copies/mL). A negative result must be combined with clinical observations, patient history, and epidemiological information. The expected result is Negative.  Fact Sheet for Patients:  EntrepreneurPulse.com.au  Fact Sheet for Healthcare Providers:  IncredibleEmployment.be  This test is no t yet approved or cleared by the [place] FDA and  has been authorized for detection and/or diagnosis of SARS-CoV-2 by FDA under an Emergency Use Authorization (EUA). This EUA will remain  in effect (meaning this test can be used) for the duration of the COVID-19 declaration under Section 564(b)(1) of the Act, 21 U.S.C.section 360bbb-3(b)(1), unless the authorization is terminated  or revoked sooner.       Influenza A by PCR NEGATIVE NEGATIVE Final   Influenza B by PCR NEGATIVE NEGATIVE Final    Comment: (NOTE) The Xpert Xpress SARS-CoV-2/FLU/RSV plus assay is intended as an aid in the diagnosis of influenza from Nasopharyngeal swab specimens and should not be used as a sole basis for treatment. Nasal washings and aspirates are unacceptable for Xpert Xpress SARS-CoV-2/FLU/RSV testing.  Fact Sheet for Patients: EntrepreneurPulse.com.au  Fact Sheet for Healthcare Providers: IncredibleEmployment.be  This test is not yet approved or cleared by the [place] FDA and has been authorized for detection and/or diagnosis of SARS-CoV-2 by FDA under an Emergency Use Authorization (EUA). This EUA will  remain in effect (meaning this test can be used) for the duration of the COVID-19 declaration under Section 564(b)(1) of the Act, 21 U.S.C. section 360bbb-3(b)(1), unless the authorization is terminated or revoked.  Performed at [facility], [address] [place], [place] [address]   MRSA Next Gen by PCR, Nasal     Status: None   Collection Time: 03/25/21  7:20 AM   Specimen: Nasal Mucosa; Nasal Swab  Result Value Ref Range Status   MRSA by PCR Next Gen NOT DETECTED NOT DETECTED Final    Comment: (NOTE) The GeneXpert MRSA Assay (FDA approved for NASAL specimens only), is one component of a comprehensive MRSA colonization surveillance program. It is not intended to diagnose MRSA infection nor to guide or monitor treatment for MRSA infections. Test performance is not FDA approved in patients less than 51 years old. Performed at [facility], [place] [address] [place], [place] [address]   Culture, blood (routine x 2)     Status: None (Preliminary result)   Collection Time: 03/25/21 10:21 AM   Specimen: BLOOD RIGHT HAND  Result Value Ref Range Status   Specimen Description BLOOD RIGHT HAND  Final   Special Requests   Final    BOTTLES DRAWN AEROBIC ONLY Blood Culture adequate volume   Culture   Final    NO GROWTH 3 DAYS Performed at [facility], [place] [address] [place], [place] [address]    Report Status PENDING  Incomplete  Culture, blood (routine x 2)     Status: None (Preliminary result)   Collection Time: 03/25/21 10:24 AM   Specimen: BLOOD LEFT HAND  Result Value Ref Range Status   Specimen Description BLOOD LEFT HAND  Final   Special Requests   Final    BOTTLES DRAWN AEROBIC ONLY Blood Culture results may not be optimal due to an inadequate volume of blood received in culture bottles   Culture   Final    NO GROWTH 3 DAYS Performed at [facility], [place] [address], [place],  [address]    Report Status PENDING  Incomplete     Time coordinating  discharge: Over 30 minutes  SIGNED:   Little Ishikawa, DO [facility] 03/28/2021, 4:22 PM Pager   If 7PM-7AM, please contact night-coverage [email or web address]

## 2021-03-28 NOTE — Progress Notes (Signed)
Reviewed discharge paperwork with patient. Belongings at bedside. IV d/c. Cane at bedside. No complaints at this time. Transported to lobby by staff.

## 2021-03-28 NOTE — Discharge Summary (Incomplete)
Physician Discharge Summary  Travis [name] [id number] DOB: 1965/07/28 DOA: 03/23/2021  PCP: Marliss Coots, NP  Admit date: 03/23/2021 Discharge date: 03/28/2021  Admitted From: *** Disposition:  ***  Recommendations for Outpatient Follow-up:  Follow up with PCP in 1-2 weeks Please obtain BMP/CBC in one week Please follow up on the following pending results:  Home Health:***  Equipment/Devices:***  Discharge Condition:***  CODE STATUS:***  Diet recommendation:    Brief/Interim Summary: ***  Discharge Diagnoses:  Principal Problem:   Fever Active Problems:   GERD   Hypertension   Tobacco abuse   MDD (major depressive disorder), recurrent episode, severe (HCC)   Chronic pain syndrome   FUO (fever of unknown origin)   Hematuria   Leukopenia   Thrombocytopenia ([place])    Discharge Instructions  Discharge Instructions     Ambulatory referral to Physical Therapy   Complete by: As directed       Allergies as of 03/28/2021       Reactions   Blueberry Flavor Hives   Penicillins Hives, Itching, Other (See Comments)   Hallucinations. Has patient had a PCN reaction causing immediate rash, facial/tongue/throat swelling, SOB or lightheadedness with hypotension:YES Has patient had a PCN reaction causing severe rash involving mucus membranes or skin necrosis: NO Has patient had a PCN reaction that required hospitalization: yes Has patient had a PCN reaction occurring within the last 10 years: NO If all of the above answers are "NO", then may proceed with Cephalosporin use. * Zosyn x 1 03/24/21. No rxn noted.   Robaxin [methocarbamol] Palpitations   Toradol [ketorolac Tromethamine] Hives, Other (See Comments)   Headache     Med Rec must be completed prior to using this [facility]***        Durable Medical Equipment  (From admission, onward)           Start     Ordered   03/27/21 1001  For home use only DME Other see comment  Once       Comments: cane   Question:  Length of Need  Answer:  Lifetime   03/27/21 1002            Follow-up Information     [name], Audrea Muscat, NP Follow up.   Contact information: [place] [address] [phone]         [facility] Follow [facility].   Specialty: Rehabilitation Why: Rerferral made for out patient PHYSICAL THERAPY. Please call and arrange appointment tine. Contact information: [place] [id number] [place] [address] [phone]               Allergies  Allergen Reactions   Blueberry Flavor Hives   Penicillins Hives, Itching and Other (See Comments)    Hallucinations. Has patient had a PCN reaction causing immediate rash, facial/tongue/throat swelling, SOB or lightheadedness with hypotension:YES Has patient had a PCN reaction causing severe rash involving mucus membranes or skin necrosis: NO Has patient had a PCN reaction that required hospitalization: yes Has patient had a PCN reaction occurring within the last 10 years: NO If all of the above answers are "NO", then may proceed with Cephalosporin use. * Zosyn x 1 03/24/21. No rxn noted.   Robaxin [Methocarbamol] Palpitations   Toradol [Ketorolac Tromethamine] Hives and Other (See Comments)    Headache    Consultations: ***Specify Physician/Group   Procedures/Studies: DG Lumbar Spine Complete  Result Date: 03/01/2021 CLINICAL DATA:  Status post fall 5 days  ago with subsequent back pain. EXAM: LUMBAR SPINE - COMPLETE 4+ VIEW COMPARISON:  June 27, 2014 FINDINGS: There is no evidence of lumbar spine fracture. Alignment is normal. Moderate severity multilevel endplate sclerosis is seen throughout all levels of the lumbar spine with moderate severity anterior osteophyte formation seen at the levels of L1-L2 and L2-L3. Mild multilevel intervertebral disc space narrowing is noted. IMPRESSION: 1. No evidence of lumbar spine fracture or  subluxation. 2. Moderate severity multilevel degenerative changes, as described above. Electronically Signed   By: Virgina Norfolk M.D.   On: 03/01/2021 23:46   CT Angio Chest Pulmonary Embolism (PE) W or WO Contrast  Result Date: 03/24/2021 CLINICAL DATA:  Tachypnea, increased shortness of breath EXAM: CT ANGIOGRAPHY CHEST WITH CONTRAST TECHNIQUE: Multidetector CT imaging of the chest was performed using the standard protocol during bolus administration of intravenous contrast. Multiplanar CT image reconstructions and MIPs were obtained to evaluate the vascular anatomy. RADIATION DOSE REDUCTION: This exam was performed according to the departmental dose-optimization program which includes automated exposure control, adjustment of the mA and/or kV according to patient size and/or use of iterative reconstruction technique. CONTRAST:  125mL OMNIPAQUE IOHEXOL 350 MG/ML SOLN COMPARISON:  None. FINDINGS: Cardiovascular: Pulmonary arteries are well visualized, normal in caliber and patent. Negative for significant acute filling defect or pulmonary embolus by CTA. Intact thoracic aorta. Negative for aneurysm or dissection. No mediastinal hemorrhage or hematoma. Normal heart size. No pericardial effusion. Native coronary atherosclerosis noted. Central venous structures appear patent.  No veno-occlusive process. Mediastinum/Nodes: No enlarged mediastinal, hilar, or axillary lymph nodes. Thyroid gland, trachea, and esophagus demonstrate no significant findings. Lungs/Pleura: Minor dependent basilar atelectasis. No acute airspace process, collapse or consolidation. Negative for interstitial process or edema. No pleural abnormality, effusion, or pneumothorax. Trachea and central airways are patent. Upper Abdomen: No acute upper abdominal finding. Renal cysts noted in the right kidney upper pole, largest measures 7 cm in diameter. Mesenteric and renal vasculature all patent. Musculoskeletal: Degenerative changes throughout  the spine. No acute osseous finding. Negative for compression fracture. Sternum intact. Review of the MIP images confirms the above findings. IMPRESSION: Negative for significant acute pulmonary embolus by CTA. Minor aortic atherosclerosis. Native coronary atherosclerosis. No other acute intrathoracic finding. Aortic Atherosclerosis (ICD10-I70.0). Electronically Signed   By: Jerilynn Mages.  Shick M.D.   On: 03/24/2021 10:20   MR THORACIC SPINE W WO CONTRAST  Result Date: 03/24/2021 CLINICAL DATA:  56 year old male with back pain and fever in the setting of drug use. Query spinal abscess. EXAM: MRI THORACIC AND LUMBAR SPINE WITHOUT AND WITH CONTRAST TECHNIQUE: Multiplanar and multiecho pulse sequences of the thoracic and lumbar spine were obtained without and with intravenous contrast. CONTRAST:  13mL GADAVIST GADOBUTROL 1 MMOL/ML IV SOLN COMPARISON:  Thoracic spine radiographs 08/26/2007. MRI lumbar spine 12/19/2013. CTA chest 04/28/2018. FINDINGS: MRI THORACIC SPINE FINDINGS Limited cervical spine imaging:  Unremarkable. Thoracic spine segmentation:  Appears to be normal. Alignment: Chronic mildly exaggerated lower thoracic kyphosis appears stable since 2020. No thoracic spondylolisthesis. Vertebrae: No marrow edema or evidence of acute osseous abnormality. Visualized bone marrow signal is within normal limits. Cord: Circumscribed short segment central spinal cord increased T2 hyperintensity at T7-T8 and T8-T9 (series 19, image 11). No cord expansion. No abnormal cord enhancement or edema. This is most compatible with small focal syrinx. Above and below that level spinal cord signal and morphology are within normal limits. No abnormal intradural enhancement. No dural thickening. Conus medullaris appears normal at T12-L1. Paraspinal and other soft tissues:  Negative. Disc levels: Intermittent thoracic spine degeneration, notable for: T2-T3: Posterior disc bulging and/or endplate spurring effacing the ventral CSF space but no  significant stenosis. T8-T9: Subtle disc bulging. Mild facet and ligament flavum hypertrophy. No spinal stenosis. Mild to moderate right T8 neural foraminal stenosis. T9-T10: Moderate posterior element hypertrophy. Mild T9 foraminal stenosis. T10-T11: Subtle disc bulging. Mild to moderate posterior element hypertrophy with no significant stenosis. MRI LUMBAR SPINE FINDINGS Segmentation: Normal, concordant with the thoracic spine numbering today. Alignment:  Relatively preserved lumbar lordosis, stable since 2015. Vertebrae: No marrow edema or evidence of acute osseous abnormality. Visualized bone marrow signal is within normal limits. Intact visible sacrum and SI joints. Conus medullaris: Extends to the T12-L1 level. No lower spinal cord or conus signal abnormality. Normal cauda equina nerve roots. No abnormal intradural enhancement. No dural thickening. Paraspinal and other soft tissues: Partially visible exophytic and large but simple/benign appearing right renal cysts. Otherwise negative visible abdominal viscera. Lumbar paraspinal soft tissues are within normal limits. Disc levels: Ordinary lumbar spine degeneration, notable for: L2-L3: Increased more broad-based posterior disc bulging since 2015. Increased mild spinal stenosis with fairly symmetric lateral recess stenosis. L3-L4: Chronic disc bulging but increased small central disc protrusion (series 28, image 20). Mild posterior element hypertrophy. Increased mild to moderate spinal stenosis. Mild L3 foraminal stenosis is increased in greater on the right. L4-L5: Chronic broad-based posterior disc protrusion with annular fissure and underlying left eccentric circumferential disc bulging has mildly progressed. Mild posterior element hypertrophy. Mild to moderate lateral recess stenosis greater on the left (L5 nerve levels). Borderline to mild spinal stenosis. Increased mild left L4 foraminal stenosis. L5-S1: Bulky chronic posterior and foraminal disc bulging  with endplate spurring. Mild facet hypertrophy. No spinal stenosis. Increased mild to moderate lateral recess stenosis (bilateral S1 nerve levels). Up to moderate left L5 foraminal stenosis is stable but severe right L5 foraminal stenosis has progressed (series 25 image 6) and appears related to new discrete foraminal disc extrusion (9 mm, series 31, image 29) superimposed on chronic disc bulging. IMPRESSION: 1. No acute osseous or inflammatory process in the thoracic or lumbar spine. 2. 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 But otherwise normal spinal cord. Generally mild thoracic spine degeneration with no thoracic spinal stenosis. 3. Lumbar spine degeneration, progressed since a 2015 MRI especially at L5-S1, where a roughly 9 mm right foraminal disc extrusion is suspected. Severe foraminal stenosis. Query right L5 radiculitis. 4. Increased lumbar mild to moderate spinal stenosis also at L3-L4 and L4-L5. Electronically Signed   By: Genevie Ann M.D.   On: 03/24/2021 05:32   MR Lumbar Spine W Wo Contrast  Result Date: 03/24/2021 CLINICAL DATA:  56 year old male with back pain and fever in the setting of drug use. Query spinal abscess. EXAM: MRI THORACIC AND LUMBAR SPINE WITHOUT AND WITH CONTRAST TECHNIQUE: Multiplanar and multiecho pulse sequences of the thoracic and lumbar spine were obtained without and with intravenous contrast. CONTRAST:  31mL GADAVIST GADOBUTROL 1 MMOL/ML IV SOLN COMPARISON:  Thoracic spine radiographs 08/26/2007. MRI lumbar spine 12/19/2013. CTA chest 04/28/2018. FINDINGS: MRI THORACIC SPINE FINDINGS Limited cervical spine imaging:  Unremarkable. Thoracic spine segmentation:  Appears to be normal. Alignment: Chronic mildly exaggerated lower thoracic kyphosis appears stable since 2020. No thoracic spondylolisthesis. Vertebrae: No marrow edema or evidence of acute osseous abnormality. Visualized bone marrow signal is within normal limits. Cord: Circumscribed  short segment central spinal cord increased T2 hyperintensity at T7-T8 and T8-T9 (series 19,  image 11). No cord expansion. No abnormal cord enhancement or edema. This is most compatible with small focal syrinx. Above and below that level spinal cord signal and morphology are within normal limits. No abnormal intradural enhancement. No dural thickening. Conus medullaris appears normal at T12-L1. Paraspinal and other soft tissues: Negative. Disc levels: Intermittent thoracic spine degeneration, notable for: T2-T3: Posterior disc bulging and/or endplate spurring effacing the ventral CSF space but no significant stenosis. T8-T9: Subtle disc bulging. Mild facet and ligament flavum hypertrophy. No spinal stenosis. Mild to moderate right T8 neural foraminal stenosis. T9-T10: Moderate posterior element hypertrophy. Mild T9 foraminal stenosis. T10-T11: Subtle disc bulging. Mild to moderate posterior element hypertrophy with no significant stenosis. MRI LUMBAR SPINE FINDINGS Segmentation: Normal, concordant with the thoracic spine numbering today. Alignment:  Relatively preserved lumbar lordosis, stable since 2015. Vertebrae: No marrow edema or evidence of acute osseous abnormality. Visualized bone marrow signal is within normal limits. Intact visible sacrum and SI joints. Conus medullaris: Extends to the T12-L1 level. No lower spinal cord or conus signal abnormality. Normal cauda equina nerve roots. No abnormal intradural enhancement. No dural thickening. Paraspinal and other soft tissues: Partially visible exophytic and large but simple/benign appearing right renal cysts. Otherwise negative visible abdominal viscera. Lumbar paraspinal soft tissues are within normal limits. Disc levels: Ordinary lumbar spine degeneration, notable for: L2-L3: Increased more broad-based posterior disc bulging since 2015. Increased mild spinal stenosis with fairly symmetric lateral recess stenosis. L3-L4: Chronic disc bulging but increased  small central disc protrusion (series 28, image 20). Mild posterior element hypertrophy. Increased mild to moderate spinal stenosis. Mild L3 foraminal stenosis is increased in greater on the right. L4-L5: Chronic broad-based posterior disc protrusion with annular fissure and underlying left eccentric circumferential disc bulging has mildly progressed. Mild posterior element hypertrophy. Mild to moderate lateral recess stenosis greater on the left (L5 nerve levels). Borderline to mild spinal stenosis. Increased mild left L4 foraminal stenosis. L5-S1: Bulky chronic posterior and foraminal disc bulging with endplate spurring. Mild facet hypertrophy. No spinal stenosis. Increased mild to moderate lateral recess stenosis (bilateral S1 nerve levels). Up to moderate left L5 foraminal stenosis is stable but severe right L5 foraminal stenosis has progressed (series 25 image 6) and appears related to new discrete foraminal disc extrusion (9 mm, series 31, image 29) superimposed on chronic disc bulging. IMPRESSION: 1. No acute osseous or inflammatory process in the thoracic or lumbar spine. 2. 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 But otherwise normal spinal cord. Generally mild thoracic spine degeneration with no thoracic spinal stenosis. 3. Lumbar spine degeneration, progressed since a 2015 MRI especially at L5-S1, where a roughly 9 mm right foraminal disc extrusion is suspected. Severe foraminal stenosis. Query right L5 radiculitis. 4. Increased lumbar mild to moderate spinal stenosis also at L3-L4 and L4-L5. Electronically Signed   By: Genevie Ann M.D.   On: 03/24/2021 05:32   CT ABDOMEN PELVIS W CONTRAST  Result Date: 03/04/2021 CLINICAL DATA:  Right lower quadrant pain. EXAM: CT ABDOMEN AND PELVIS WITH CONTRAST TECHNIQUE: Multidetector CT imaging of the abdomen and pelvis was performed using the standard protocol following bolus administration of intravenous contrast. CONTRAST:  141mL  OMNIPAQUE IOHEXOL 300 MG/ML  SOLN COMPARISON:  October 07, 2020 FINDINGS: Lower chest: No acute abnormality. Hepatobiliary: No focal liver abnormality is seen. No gallstones, gallbladder wall thickening, or biliary dilatation. Pancreas: Unremarkable. No pancreatic ductal dilatation or surrounding inflammatory changes. Spleen: Normal in size without focal abnormality. Adrenals/Urinary  Tract: Adrenal glands are unremarkable. Kidneys are normal, without renal calculi or hydronephrosis. Multiple bilateral simple renal cysts are seen. The largest measures 6.9 cm x 5.8 cm and is located within the anterior aspect of the mid right kidney. Bladder is unremarkable. Stomach/Bowel: Stomach is within normal limits. Appendix appears normal. No evidence of bowel wall thickening, distention, or inflammatory changes. Noninflamed diverticula are seen within the transverse colon. Vascular/Lymphatic: No significant vascular findings are present. No enlarged abdominal or pelvic lymph nodes. Reproductive: Prostate is unremarkable. Other: No abdominal wall hernia or abnormality. No abdominopelvic ascites. Musculoskeletal: Multilevel degenerative changes are seen throughout the lumbar spine. IMPRESSION: 1. Colonic diverticulosis. 2. Multiple bilateral simple renal cysts. 3. Normal appendix. Electronically Signed   By: Virgina Norfolk M.D.   On: 03/04/2021 22:19   DG Chest Port 1 View  Result Date: 03/23/2021 CLINICAL DATA:  Fever x3 weeks. EXAM: PORTABLE CHEST 1 VIEW COMPARISON:  October 07, 2020 FINDINGS: The heart size and mediastinal contours are within normal limits. Very mild atelectatic changes seen within the left lung base. Both lungs are otherwise clear. The visualized skeletal structures are unremarkable. IMPRESSION: Very mild left basilar atelectasis. Electronically Signed   By: Virgina Norfolk M.D.   On: 03/23/2021 22:10   ECHOCARDIOGRAM COMPLETE  Result Date: 03/25/2021    ECHOCARDIOGRAM REPORT   Patient Name:   Travis [name] Date of Exam: 03/25/2021 Medical Rec #:  [id number]    Height:       72.0 in Accession #:    [id number]   Weight:       268.1 lb Date of Birth:  1965-12-18    BSA:          2.413 m Patient Age:    83 years     BP:           124/89 mmHg Patient Gender: M            HR:           75 bpm. Exam Location:  Inpatient Procedure: 2D Echo, Cardiac Doppler, Color Doppler and Intracardiac            Opacification Agent Indications:    Fever R50.9  History:        Patient has prior history of Echocardiogram examinations, most                 recent 04/20/2018. Risk Factors:Current Smoker.  Sonographer:    Merrie Roof RDCS Referring Phys: [place]  1. Left ventricular ejection fraction, by estimation, is 60 to 65%. The left ventricle has normal function. The left ventricle has no regional wall motion abnormalities. There is mild concentric left ventricular hypertrophy. Left ventricular diastolic parameters are consistent with Grade I diastolic dysfunction (impaired relaxation).  2. Right ventricular systolic function is normal. The right ventricular size is normal.  3. Left atrial size was mildly dilated.  4. The mitral valve is normal in structure. No evidence of mitral valve regurgitation. No evidence of mitral stenosis.  5. The aortic valve is tricuspid. Aortic valve regurgitation is not visualized. No aortic stenosis is present.  6. Aortic dilatation noted. There is mild dilatation of the aortic root, measuring 38 mm. FINDINGS  Left Ventricle: Left ventricular ejection fraction, by estimation, is 60 to 65%. The left ventricle has normal function. The left ventricle has no regional wall motion abnormalities. Definity contrast agent was given IV to delineate the left ventricular  endocardial borders. The left ventricular internal  cavity size was normal in size. There is mild concentric left ventricular hypertrophy. Left ventricular diastolic parameters are consistent with Grade I diastolic  dysfunction (impaired relaxation). Normal left ventricular filling pressure. Right Ventricle: The right ventricular size is normal. No increase in right ventricular wall thickness. Right ventricular systolic function is normal. Left Atrium: Left atrial size was mildly dilated. Right Atrium: Right atrial size was normal in size. Pericardium: There is no evidence of pericardial effusion. Mitral Valve: The mitral valve is normal in structure. No evidence of mitral valve regurgitation. No evidence of mitral valve stenosis. Tricuspid Valve: The tricuspid valve is normal in structure. Tricuspid valve regurgitation is not demonstrated. No evidence of tricuspid stenosis. Aortic Valve: The aortic valve is tricuspid. Aortic valve regurgitation is not visualized. No aortic stenosis is present. Aortic valve mean gradient measures 4.0 mmHg. Aortic valve peak gradient measures 7.5 mmHg. Aortic valve area, by VTI measures 2.75 cm. Pulmonic Valve: The pulmonic valve was normal in structure. Pulmonic valve regurgitation is not visualized. No evidence of pulmonic stenosis. Aorta: Aortic dilatation noted. There is mild dilatation of the aortic root, measuring 38 mm. IAS/Shunts: No atrial level shunt detected by color flow Doppler.  LEFT VENTRICLE PLAX 2D LVIDd:         4.70 cm   Diastology LVIDs:         3.50 cm   LV e' medial:    8.16 cm/s LV PW:         1.20 cm   LV E/e' medial:  9.2 LV IVS:        1.20 cm   LV e' lateral:   8.92 cm/s LVOT diam:     2.20 cm   LV E/e' lateral: 8.4 LV SV:         72 LV SV Index:   30 LVOT Area:     3.80 cm  LEFT ATRIUM             Index LA diam:        4.20 cm 1.74 cm/m LA Vol (A2C):   73.5 ml 30.46 ml/m LA Vol (A4C):   79.3 ml 32.86 ml/m LA Biplane Vol: 77.9 ml 32.28 ml/m  AORTIC VALVE AV Area (Vmax):    2.86 cm AV Area (Vmean):   2.70 cm AV Area (VTI):     2.75 cm AV Vmax:           137.00 cm/s AV Vmean:          93.000 cm/s AV VTI:            0.263 m AV Peak Grad:      7.5 mmHg AV Mean  Grad:      4.0 mmHg LVOT Vmax:         103.00 cm/s LVOT Vmean:        66.000 cm/s LVOT VTI:          0.190 m LVOT/AV VTI ratio: 0.72  AORTA Ao Root diam: 3.85 cm MITRAL VALVE MV Area (PHT): 3.77 cm    SHUNTS MV Decel Time: 201 msec    Systemic VTI:  0.19 m MV E velocity: 74.90 cm/s  Systemic Diam: 2.20 cm MV A velocity: 72.80 cm/s MV E/A ratio:  1.03 Chilton Si MD Electronically signed by Chilton Si MD Signature Date/Time: 03/25/2021/3:27:02 PM    Final    US Abdomen Limited RUQ (LIVER/GB)  Result Date: 03/25/2021 CLINICAL DATA:  Pain EXAM: ULTRASOUND ABDOMEN LIMITED RIGHT UPPER QUADRANT COMPARISON:  None.  FINDINGS: Gallbladder: No gallstones or wall thickening visualized. No sonographic Murphy sign noted by sonographer. Common bile duct: Diameter: 7 mm Liver: No focal lesion identified. Increased parenchymal echogenicity. Portal vein is patent on color Doppler imaging with normal direction of blood flow towards the liver. Other: Incidental simple, benign exophytic cyst of the superior pole of the right kidney measuring 6.2 cm. IMPRESSION: 1. No ultrasound abnormality of the right upper quadrant to explain pain. Consider CT or MRI to further evaluate otherwise unexplained abdominal pain. 2.  Hepatic steatosis. Electronically Signed   By: Delanna Ahmadi M.D.   On: 03/25/2021 17:39     Subjective: ***   Discharge Exam: Vitals:   03/28/21 0005 03/28/21 0407  BP:    Pulse: 76 80  Resp:    Temp: 97.8 F (36.6 C) 98.6 F (37 C)  SpO2: 97% 100%   Vitals:   03/27/21 1554 03/27/21 1946 03/28/21 0005 03/28/21 0407  BP: (!) 141/93 (!) 147/93    Pulse: 70 88 76 80  Resp: 19 20    Temp: 98.2 F (36.8 C) 97.6 F (36.4 C) 97.8 F (36.6 C) 98.6 F (37 C)  TempSrc:  Oral Oral Oral  SpO2: 96% 98% 97% 100%  Weight:      Height:        General: Pt is alert, awake, not in acute distress Cardiovascular: RRR, S1/S2 +, no rubs, no gallops Respiratory: CTA bilaterally, no wheezing, no  rhonchi Abdominal: Soft, NT, ND, bowel sounds + Extremities: no edema, no cyanosis    The results of significant diagnostics from this hospitalization (including imaging, microbiology, ancillary and laboratory) are listed below for reference.     Microbiology: Recent Results (from the past 240 hour(s))  Urine Culture     Status: None   Collection Time: 03/23/21  9:00 PM   Specimen: Urine, Clean Catch  Result Value Ref Range Status   Specimen Description   Final    URINE, CLEAN CATCH Performed at [facility], [address] [place], [place] [address]    Special Requests   Final    NONE Performed at [facility], [address] [place], [place] [address]    Culture   Final    NO GROWTH Performed at [facility], [place] [address], [place],  [address]    Report Status 03/25/2021 FINAL  Final  Resp Panel by RT-PCR (Flu A&B, Covid) Nasopharyngeal Swab     Status: None   Collection Time: 03/23/21  9:13 PM   Specimen: Nasopharyngeal Swab; Nasopharyngeal(NP) swabs in vial transport medium  Result Value Ref Range Status   SARS Coronavirus 2 by RT PCR NEGATIVE NEGATIVE Final    Comment: (NOTE) SARS-CoV-2 target nucleic acids are NOT DETECTED.  The SARS-CoV-2 RNA is generally detectable in upper respiratory specimens during the acute phase of infection. The lowest concentration of SARS-CoV-2 viral copies this assay can detect is 138 copies/mL. A negative result does not preclude SARS-Cov-2 infection and should not be used as the sole basis for treatment or other patient management decisions. A negative result may occur with  improper specimen collection/handling, submission of specimen other than nasopharyngeal swab, presence of viral mutation(s) within the areas targeted by this assay, and inadequate number of viral copies(<138 copies/mL). A negative result must be combined with clinical observations, patient history, and epidemiological information. The expected  result is Negative.  Fact Sheet for Patients:  EntrepreneurPulse.com.au  Fact Sheet for Healthcare Providers:  IncredibleEmployment.be  This test is  no t yet approved or cleared by the [place] and  has been authorized for detection and/or diagnosis of SARS-CoV-2 by FDA under an Emergency Use Authorization (EUA). This EUA will remain  in effect (meaning this test can be used) for the duration of the COVID-19 declaration under Section 564(b)(1) of the Act, 21 U.S.C.section 360bbb-3(b)(1), unless the authorization is terminated  or revoked sooner.       Influenza A by PCR NEGATIVE NEGATIVE Final   Influenza B by PCR NEGATIVE NEGATIVE Final    Comment: (NOTE) The Xpert Xpress SARS-CoV-2/FLU/RSV plus assay is intended as an aid in the diagnosis of influenza from Nasopharyngeal swab specimens and should not be used as a sole basis for treatment. Nasal washings and aspirates are unacceptable for Xpert Xpress SARS-CoV-2/FLU/RSV testing.  Fact Sheet for Patients: EntrepreneurPulse.com.au  Fact Sheet for Healthcare Providers: IncredibleEmployment.be  This test is not yet approved or cleared by the [place] FDA and has been authorized for detection and/or diagnosis of SARS-CoV-2 by FDA under an Emergency Use Authorization (EUA). This EUA will remain in effect (meaning this test can be used) for the duration of the COVID-19 declaration under Section 564(b)(1) of the Act, 21 U.S.C. section 360bbb-3(b)(1), unless the authorization is terminated or revoked.  Performed at [facility], [address] [place], [place] [address]   MRSA Next Gen by PCR, Nasal     Status: None   Collection Time: 03/25/21  7:20 AM   Specimen: Nasal Mucosa; Nasal Swab  Result Value Ref Range Status   MRSA by PCR Next Gen NOT DETECTED NOT DETECTED Final    Comment: (NOTE) The GeneXpert MRSA Assay (FDA approved for NASAL  specimens only), is one component of a comprehensive MRSA colonization surveillance program. It is not intended to diagnose MRSA infection nor to guide or monitor treatment for MRSA infections. Test performance is not FDA approved in patients less than 89 years old. Performed at [name], [place] [address] [place], [place] [address]   Culture, blood (routine x 2)     Status: None (Preliminary result)   Collection Time: 03/25/21 10:21 AM   Specimen: BLOOD RIGHT HAND  Result Value Ref Range Status   Specimen Description BLOOD RIGHT HAND  Final   Special Requests   Final    BOTTLES DRAWN AEROBIC ONLY Blood Culture adequate volume   Culture   Final    NO GROWTH 3 DAYS Performed at [facility], [place] [address], [place], [place] [address]    Report Status PENDING  Incomplete  Culture, blood (routine x 2)     Status: None (Preliminary result)   Collection Time: 03/25/21 10:24 AM   Specimen: BLOOD LEFT HAND  Result Value Ref Range Status   Specimen Description BLOOD LEFT HAND  Final   Special Requests   Final    BOTTLES DRAWN AEROBIC ONLY Blood Culture results may not be optimal due to an inadequate volume of blood received in culture bottles   Culture   Final    NO GROWTH 3 DAYS Performed at [facility], [place] [address] [place], [place] [address]    Report Status PENDING  Incomplete     Labs: BNP (last 3 results) No results for input(s): BNP in the last 8760 hours. Basic Metabolic Panel: Recent Labs  Lab 03/23/21 2226 03/25/21 0513  NA 133* 135  K 3.7 3.6  CL 96* 103  CO2 28 23  GLUCOSE 132* 144*  BUN 18 15  CREATININE 1.26* 1.05  CALCIUM 8.6* 7.8*   Liver Function Tests: Recent Labs  Lab 03/23/21 2226  AST 28  ALT 34  ALKPHOS 93  BILITOT 1.6*  PROT 7.6  ALBUMIN 3.8   No results for input(s): LIPASE, AMYLASE in the last 168 hours. No results for input(s): AMMONIA in the last 168 hours. CBC: Recent Labs  Lab 03/23/21 2226 03/25/21 0724  03/26/21 0450 03/26/21 1235 03/27/21 0247 03/28/21 0238  WBC 10.9* 3.1* 2.5* 3.0* 3.6* 4.6  NEUTROABS 9.4*  --   --   --  1.6*  --   HGB 16.6 12.9* 12.7* 13.5 13.5 12.9*  HCT 51.3 39.4 38.0* 41.2 40.1 39.8  MCV 89.7 90.6 88.4 89.0 89.5 89.2  PLT 108* PLATELET CLUMPS NOTED ON SMEAR, UNABLE TO ESTIMATE 79* 95* 97* 104*   Cardiac Enzymes: No results for input(s): CKTOTAL, CKMB, CKMBINDEX, TROPONINI in the last 168 hours. BNP: Invalid input(s): POCBNP CBG: Recent Labs  Lab 03/26/21 0816 03/26/21 1128 03/26/21 1617 03/26/21 1945  GLUCAP 124* 125* 158* 151*   D-Dimer No results for input(s): DDIMER in the last 72 hours. Hgb A1c No results for input(s): HGBA1C in the last 72 hours. Lipid Profile No results for input(s): CHOL, HDL, LDLCALC, TRIG, CHOLHDL, LDLDIRECT in the last 72 hours. Thyroid function studies No results for input(s): TSH, T4TOTAL, T3FREE, THYROIDAB in the last 72 hours.  Invalid input(s): FREET3 Anemia work up Recent Labs    03/27/21 0247  VITAMINB12 284  TIBC 266  IRON 79  RETICCTPCT 1.2   Urinalysis    Component Value Date/Time   COLORURINE AMBER (A) 03/23/2021 2053   APPEARANCEUR CLEAR 03/23/2021 2053   LABSPEC 1.030 03/23/2021 2053   PHURINE 5.0 03/23/2021 2053   GLUCOSEU NEGATIVE 03/23/2021 2053   GLUCOSEU NEG mg/dL 11/25/2007 0047   HGBUR LARGE (A) 03/23/2021 2053   BILIRUBINUR NEGATIVE 03/23/2021 2053   BILIRUBINUR SMALL 08/16/2013 1351   [place] 03/23/2021 2053   PROTEINUR 100 (A) 03/23/2021 2053   UROBILINOGEN 0.2 04/05/2014 0127   NITRITE NEGATIVE 03/23/2021 2053   LEUKOCYTESUR NEGATIVE 03/23/2021 2053   Sepsis Labs Invalid input(s): PROCALCITONIN,  WBC,  LACTICIDVEN Microbiology Recent Results (from the past 240 hour(s))  Urine Culture     Status: None   Collection Time: 03/23/21  9:00 PM   Specimen: Urine, Clean Catch  Result Value Ref Range Status   Specimen Description   Final    URINE, CLEAN CATCH Performed  at [facility], [address] [place], [place] [address]    Special Requests   Final    NONE Performed at [facility], [address] [place], [place] [address]    Culture   Final    NO GROWTH Performed at [facility], [place] [address], [place], [place] [address]    Report Status 03/25/2021 FINAL  Final  Resp Panel by RT-PCR (Flu A&B, Covid) Nasopharyngeal Swab     Status: None   Collection Time: 03/23/21  9:13 PM   Specimen: Nasopharyngeal Swab; Nasopharyngeal(NP) swabs in vial transport medium  Result Value Ref Range Status   SARS Coronavirus 2 by RT PCR NEGATIVE NEGATIVE Final    Comment: (NOTE) SARS-CoV-2 target nucleic acids are NOT DETECTED.  The SARS-CoV-2 RNA is generally detectable in upper respiratory specimens during the acute phase of infection. The lowest concentration of SARS-CoV-2 viral copies this assay can detect is 138 copies/mL. A negative result does not preclude SARS-Cov-2 infection and should  not be used as the sole basis for treatment or other patient management decisions. A negative result may occur with  improper specimen collection/handling, submission of specimen other than nasopharyngeal swab, presence of viral mutation(s) within the areas targeted by this assay, and inadequate number of viral copies(<138 copies/mL). A negative result must be combined with clinical observations, patient history, and epidemiological information. The expected result is Negative.  Fact Sheet for Patients:  EntrepreneurPulse.com.au  Fact Sheet for Healthcare Providers:  IncredibleEmployment.be  This test is no t yet approved or cleared by the [place] FDA and  has been authorized for detection and/or diagnosis of SARS-CoV-2 by FDA under an Emergency Use Authorization (EUA). This EUA will remain  in effect (meaning this test can be used) for the duration of the COVID-19 declaration under Section 564(b)(1) of the Act,  21 U.S.C.section 360bbb-3(b)(1), unless the authorization is terminated  or revoked sooner.       Influenza A by PCR NEGATIVE NEGATIVE Final   Influenza B by PCR NEGATIVE NEGATIVE Final    Comment: (NOTE) The Xpert Xpress SARS-CoV-2/FLU/RSV plus assay is intended as an aid in the diagnosis of influenza from Nasopharyngeal swab specimens and should not be used as a sole basis for treatment. Nasal washings and aspirates are unacceptable for Xpert Xpress SARS-CoV-2/FLU/RSV testing.  Fact Sheet for Patients: EntrepreneurPulse.com.au  Fact Sheet for Healthcare Providers: IncredibleEmployment.be  This test is not yet approved or cleared by the [place] FDA and has been authorized for detection and/or diagnosis of SARS-CoV-2 by FDA under an Emergency Use Authorization (EUA). This EUA will remain in effect (meaning this test can be used) for the duration of the COVID-19 declaration under Section 564(b)(1) of the Act, 21 U.S.C. section 360bbb-3(b)(1), unless the authorization is terminated or revoked.  Performed at [facility], [address] [place], [place] [address]   MRSA Next Gen by PCR, Nasal     Status: None   Collection Time: 03/25/21  7:20 AM   Specimen: Nasal Mucosa; Nasal Swab  Result Value Ref Range Status   MRSA by PCR Next Gen NOT DETECTED NOT DETECTED Final    Comment: (NOTE) The GeneXpert MRSA Assay (FDA approved for NASAL specimens only), is one component of a comprehensive MRSA colonization surveillance program. It is not intended to diagnose MRSA infection nor to guide or monitor treatment for MRSA infections. Test performance is not FDA approved in patients less than 16 years old. Performed at [facility], [place] [address], [place], [place] [address]   Culture, blood (routine x 2)     Status: None (Preliminary result)   Collection Time: 03/25/21 10:21 AM   Specimen: BLOOD RIGHT HAND  Result Value Ref Range Status    Specimen Description BLOOD RIGHT HAND  Final   Special Requests   Final    BOTTLES DRAWN AEROBIC ONLY Blood Culture adequate volume   Culture   Final    NO GROWTH 3 DAYS Performed at [facility], [place] [address], [name], [place] [address]    Report Status PENDING  Incomplete  Culture, blood (routine x 2)     Status: None (Preliminary result)   Collection Time: 03/25/21 10:24 AM   Specimen: BLOOD LEFT HAND  Result Value Ref Range Status   Specimen Description BLOOD LEFT HAND  Final   Special Requests   Final    BOTTLES DRAWN AEROBIC ONLY Blood Culture results may not be optimal due to an inadequate volume of blood received  in culture bottles   Culture   Final    NO GROWTH 3 DAYS Performed at [facility], [place] [address], [place], [place] [address]    Report Status PENDING  Incomplete     Time coordinating discharge: Over 30 minutes  SIGNED:   Little Ishikawa, DO [facility] 03/28/2021, 7:58 AM Pager   If 7PM-7AM, please contact night-coverage [email or web address]

## 2021-03-28 NOTE — Progress Notes (Signed)
Oncology Discharge Planning Note  [facility] Address: [address] [place] [address] [place], [place], [name] [address] Hours of Operation:  Lewayne Bunting, Monday - Friday  Clinic Contact Information:  [phone]) [phone]  Oncology Care Team: Medical Oncologist:  Dr Al Pimple  Patient Details: Name:  Travis [name], Travis [name] MRN:   [id number] DOB:   [birth year] Reason for Current Admission: Fever  Discharge Planning Narrative: Discharge follow-up appointments for oncology are current and available on the AVS and MyChart.   Upon discharge from the hospital, hematology/oncology's post discharge plan of care for the outpatient setting is: Labs and OV with Dr. Al Pimple on  04/23/21  Irene Limbo will be called within two business days after discharge to review hematology/oncology's plan of care for full understanding.    Outpatient Oncology Specific Care Only: Oncology appointment transportation needs addressed?:  not applicable Oncology medication management for symptom management addressed?:  not applicable Chemo Alert Card reviewed?:  not applicable Immunotherapy Alert Card reviewed?:  not applicable

## 2021-03-28 NOTE — Progress Notes (Signed)
Mobility Specialist Criteria Algorithm Info. ° ° ° 03/28/21 1130  °Mobility  °Activity Ambulated independently in room;Dangled on edge of bed  °Range of Motion/Exercises Active;Passive  °Level of Assistance Standby assist, set-up cues, supervision of patient - no hands on  °Assistive Device Cane  °Distance Ambulated (ft) 10 ft  °Activity Response Tolerated fair;RN notified  ° °Patient received in bed willing to participate in mobility with anticipation to be discharged soon. Was independent for bed mobility and independent to stand. Upon taking steps pt was nauseated and deferred further ambulation. Per pt, nausea and vomiting has been limiting activity. Was left dangling EOB with all needs met, call bell in reach. RN notified. ° °03/28/2021 °3:59 PM ° ° , CMS, BS EXP °Acute Rehabilitation Services  °Phone:[phone] °Office: [phone]' °

## 2021-03-28 NOTE — Progress Notes (Signed)
PT Cancellation Note  Patient Details Name: Travis [name] MRN: [id number] DOB: 29-Feb-1966   Cancelled Treatment:    Reason Eval/Treat Not Completed: Medical issues which prohibited therapy.  Reported he had just tried to walk and was nauseated and had diarrhea.  Pt is expecting to go home, but will retry at another time if this is not completed.   Ivar Drape 03/28/2021, 4:20 PM  Samul Dada, PT PhD Acute Rehab Dept. Number: [facility] [phone] and [facility] [phone]

## 2021-03-29 ENCOUNTER — Telehealth: Payer: Self-pay | Admitting: *Deleted

## 2021-03-29 ENCOUNTER — Encounter: Payer: Self-pay | Admitting: *Deleted

## 2021-03-29 NOTE — Progress Notes (Signed)
Travis [name] was contacted by telephone to verify understanding of discharge instructions status post their most recent discharge from the hospital on the date:  03/28/21.  Inpatient discharge AVS was re-reviewed with patient, along with cancer center appointments.  Verification of understanding for oncology specific follow-up was validated using the Teach Back method.    Transportation to appointments were confirmed for the patient as being self/caregiver.  Travis [name] questions were addressed to their satisfaction upon completion of this post discharge follow-up call for outpatient oncology.

## 2021-03-29 NOTE — Telephone Encounter (Signed)
Attempted to contact patient for discharge follow up,, however mailbox is full and cannot leave a message.  Will continue to reach him.

## 2021-03-30 LAB — CULTURE, BLOOD (ROUTINE X 2)
Culture: NO GROWTH
Culture: NO GROWTH
Special Requests: ADEQUATE

## 2021-04-20 ENCOUNTER — Other Ambulatory Visit: Payer: Self-pay | Admitting: *Deleted

## 2021-04-20 DIAGNOSIS — D696 Thrombocytopenia, unspecified: Secondary | ICD-10-CM

## 2021-04-23 ENCOUNTER — Inpatient Hospital Stay: Payer: Self-pay | Attending: Hematology and Oncology

## 2021-04-23 ENCOUNTER — Inpatient Hospital Stay (HOSPITAL_BASED_OUTPATIENT_CLINIC_OR_DEPARTMENT_OTHER): Payer: Self-pay | Admitting: Hematology and Oncology

## 2021-04-23 ENCOUNTER — Other Ambulatory Visit: Payer: Self-pay

## 2021-04-23 ENCOUNTER — Encounter: Payer: Self-pay | Admitting: Hematology and Oncology

## 2021-04-23 DIAGNOSIS — Z79899 Other long term (current) drug therapy: Secondary | ICD-10-CM | POA: Insufficient documentation

## 2021-04-23 DIAGNOSIS — D696 Thrombocytopenia, unspecified: Secondary | ICD-10-CM

## 2021-04-23 DIAGNOSIS — R509 Fever, unspecified: Secondary | ICD-10-CM | POA: Insufficient documentation

## 2021-04-23 DIAGNOSIS — R109 Unspecified abdominal pain: Secondary | ICD-10-CM | POA: Insufficient documentation

## 2021-04-23 DIAGNOSIS — F32A Depression, unspecified: Secondary | ICD-10-CM | POA: Insufficient documentation

## 2021-04-23 DIAGNOSIS — D61818 Other pancytopenia: Secondary | ICD-10-CM | POA: Insufficient documentation

## 2021-04-23 DIAGNOSIS — F17210 Nicotine dependence, cigarettes, uncomplicated: Secondary | ICD-10-CM | POA: Insufficient documentation

## 2021-04-23 DIAGNOSIS — Z86711 Personal history of pulmonary embolism: Secondary | ICD-10-CM | POA: Insufficient documentation

## 2021-04-23 LAB — CMP (CANCER CENTER ONLY)
ALT: 22 U/L (ref 0–44)
AST: 29 U/L (ref 15–41)
Albumin: 3.7 g/dL (ref 3.5–5.0)
Alkaline Phosphatase: 113 U/L (ref 38–126)
Anion gap: 7 (ref 5–15)
BUN: 11 mg/dL (ref 6–20)
CO2: 27 mmol/L (ref 22–32)
Calcium: 8.6 mg/dL — ABNORMAL LOW (ref 8.9–10.3)
Chloride: 103 mmol/L (ref 98–111)
Creatinine: 1.36 mg/dL — ABNORMAL HIGH (ref 0.61–1.24)
GFR, Estimated: >60 mL/min (ref >60)
Glucose, Bld: 118 mg/dL — ABNORMAL HIGH (ref 70–99)
Potassium: 3.3 mmol/L — ABNORMAL LOW (ref 3.5–5.1)
Sodium: 137 mmol/L (ref 135–145)
Total Bilirubin: 1 mg/dL (ref 0.3–1.2)
Total Protein: 7.3 g/dL (ref 6.5–8.1)

## 2021-04-23 LAB — CBC WITH DIFFERENTIAL (CANCER CENTER ONLY)
Abs Immature Granulocytes: 0.01 10*3/uL (ref 0.00–0.07)
Basophils Absolute: 0.1 10*3/uL (ref 0.0–0.1)
Basophils Relative: 1 %
Eosinophils Absolute: 0.1 10*3/uL (ref 0.0–0.5)
Eosinophils Relative: 3 %
HCT: 39.4 % (ref 39.0–52.0)
Hemoglobin: 13.1 g/dL (ref 13.0–17.0)
Immature Granulocytes: 0 %
Lymphocytes Relative: 35 %
Lymphs Abs: 1.7 10*3/uL (ref 0.7–4.0)
MCH: 29.4 pg (ref 26.0–34.0)
MCHC: 33.2 g/dL (ref 30.0–36.0)
MCV: 88.3 fL (ref 80.0–100.0)
Monocytes Absolute: 0.6 10*3/uL (ref 0.1–1.0)
Monocytes Relative: 11 %
Neutro Abs: 2.5 10*3/uL (ref 1.7–7.7)
Neutrophils Relative %: 50 %
Platelet Count: 137 10*3/uL — ABNORMAL LOW (ref 150–400)
RBC: 4.46 MIL/uL (ref 4.22–5.81)
RDW: 14.6 % (ref 11.5–15.5)
WBC Count: 5 10*3/uL (ref 4.0–10.5)
nRBC: 0 % (ref 0.0–0.2)

## 2021-04-23 NOTE — Assessment & Plan Note (Signed)
This is a very pleasant 56 year old male patient whom I have met initially in the hospital when he was admitted with fevers and was also noted to have pancytopenia.  He is here for an outpatient follow-up.  Since his last visit, he says he continues to have fevers once at night, 2 days ago temperature was almost 103 degrees.  He continues to have pains in his muscles, most notable pain is in the right leg, reports muscle wasting with time.  He is understandably frustrated that he does not have answers. Physical examination today, no palpable lymphadenopathy or hepatosplenomegaly.  I do appreciate the muscle wasting he is trying to talk about. His labs revealed that his pancytopenia has actually significantly improved.  His leukopenia has resolved, anemia has resolved, his thrombocytopenia is now considered mild with platelet count just shy of 140,000. I do not believe he has a primary hematological problem especially with negative CT chest abdomen pelvis for any kind of lymphomas. He was not noted to have any splenomegaly on the CT abdomen pelvis not on exam.  Given recurrent fevers, joint and muscle pains, some muscle wasting, I wonder if he needs a rheumatology evaluation.  I also believe that he can benefit from neurology since the pain he describes is a neuropathy. I have discussed that he should contact his PCP as soon as possible for referrals to appropriate specialties.  He expressed understanding.  I will see him back once again in 4 to 6 weeks and repeat labs to make sure that his pancytopenia does not continue to recur or worsen.  He is agreeable with all these recommendations.  He understands that he can go to the emergency room with any worsening complaints. Thank you for consulting [place] in the care of this patient.  Please not hesitate to contact us with any additional questions or concerns
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
## 2021-05-24 ENCOUNTER — Other Ambulatory Visit: Payer: Self-pay

## 2021-05-24 DIAGNOSIS — D696 Thrombocytopenia, unspecified: Secondary | ICD-10-CM

## 2021-05-28 ENCOUNTER — Inpatient Hospital Stay: Payer: Self-pay | Admitting: Hematology and Oncology

## 2021-05-28 ENCOUNTER — Inpatient Hospital Stay: Payer: Self-pay | Attending: Hematology and Oncology
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
## 2023-01-20 ENCOUNTER — Emergency Department (HOSPITAL_COMMUNITY)
Admission: EM | Admit: 2023-01-20 | Discharge: 2023-01-20 | Disposition: A | Discharge status: Other Acute Inpatient Hospital | Payer: Medicaid Other

## 2023-01-20 ENCOUNTER — Ambulatory Visit (HOSPITAL_COMMUNITY)
Admission: EM | Admit: 2023-01-20 | Discharge: 2023-01-21 | Disposition: A | Discharge status: Home / Self Care | Payer: Medicaid Other | Source: Home / Self Care

## 2023-01-20 ENCOUNTER — Other Ambulatory Visit (HOSPITAL_COMMUNITY)
Admission: EM | Admit: 2023-01-20 | Discharge: 2023-01-20 | Disposition: A | Discharge status: Home / Self Care | Payer: Medicaid Other | Source: Home / Self Care

## 2023-01-20 ENCOUNTER — Encounter (HOSPITAL_COMMUNITY): Payer: Self-pay | Admitting: Emergency Medicine

## 2023-01-20 ENCOUNTER — Emergency Department (HOSPITAL_COMMUNITY): Payer: Medicaid Other

## 2023-01-20 ENCOUNTER — Other Ambulatory Visit: Payer: Self-pay

## 2023-01-20 DIAGNOSIS — F1910 Other psychoactive substance abuse, uncomplicated: Secondary | ICD-10-CM | POA: Insufficient documentation

## 2023-01-20 DIAGNOSIS — Z8249 Family history of ischemic heart disease and other diseases of the circulatory system: Secondary | ICD-10-CM | POA: Diagnosis not present

## 2023-01-20 DIAGNOSIS — R45851 Suicidal ideations: Secondary | ICD-10-CM | POA: Insufficient documentation

## 2023-01-20 DIAGNOSIS — Z5321 Procedure and treatment not carried out due to patient leaving prior to being seen by health care provider: Secondary | ICD-10-CM | POA: Diagnosis not present

## 2023-01-20 DIAGNOSIS — F333 Major depressive disorder, recurrent, severe with psychotic symptoms: Secondary | ICD-10-CM | POA: Insufficient documentation

## 2023-01-20 DIAGNOSIS — Z20822 Contact with and (suspected) exposure to covid-19: Secondary | ICD-10-CM | POA: Diagnosis not present

## 2023-01-20 DIAGNOSIS — F4310 Post-traumatic stress disorder, unspecified: Secondary | ICD-10-CM | POA: Insufficient documentation

## 2023-01-20 DIAGNOSIS — Z5941 Food insecurity: Secondary | ICD-10-CM | POA: Insufficient documentation

## 2023-01-20 DIAGNOSIS — Z5900 Homelessness unspecified: Secondary | ICD-10-CM | POA: Insufficient documentation

## 2023-01-20 DIAGNOSIS — Z79899 Other long term (current) drug therapy: Secondary | ICD-10-CM | POA: Insufficient documentation

## 2023-01-20 DIAGNOSIS — S61511A Laceration without foreign body of right wrist, initial encounter: Secondary | ICD-10-CM | POA: Diagnosis not present

## 2023-01-20 DIAGNOSIS — Z9151 Personal history of suicidal behavior: Secondary | ICD-10-CM | POA: Insufficient documentation

## 2023-01-20 DIAGNOSIS — S61512A Laceration without foreign body of left wrist, initial encounter: Secondary | ICD-10-CM | POA: Diagnosis not present

## 2023-01-20 DIAGNOSIS — F332 Major depressive disorder, recurrent severe without psychotic features: Secondary | ICD-10-CM | POA: Insufficient documentation

## 2023-01-20 DIAGNOSIS — F4323 Adjustment disorder with mixed anxiety and depressed mood: Secondary | ICD-10-CM | POA: Insufficient documentation

## 2023-01-20 DIAGNOSIS — F1920 Other psychoactive substance dependence, uncomplicated: Secondary | ICD-10-CM | POA: Diagnosis not present

## 2023-01-20 DIAGNOSIS — Z5982 Transportation insecurity: Secondary | ICD-10-CM | POA: Insufficient documentation

## 2023-01-20 DIAGNOSIS — I10 Essential (primary) hypertension: Secondary | ICD-10-CM | POA: Insufficient documentation

## 2023-01-20 DIAGNOSIS — X789XXA Intentional self-harm by unspecified sharp object, initial encounter: Secondary | ICD-10-CM | POA: Diagnosis not present

## 2023-01-20 DIAGNOSIS — R4589 Other symptoms and signs involving emotional state: Secondary | ICD-10-CM

## 2023-01-20 DIAGNOSIS — F17210 Nicotine dependence, cigarettes, uncomplicated: Secondary | ICD-10-CM | POA: Insufficient documentation

## 2023-01-20 DIAGNOSIS — Z5986 Financial insecurity: Secondary | ICD-10-CM | POA: Diagnosis not present

## 2023-01-20 DIAGNOSIS — T1491XA Suicide attempt, initial encounter: Secondary | ICD-10-CM | POA: Diagnosis present

## 2023-01-20 LAB — COMPREHENSIVE METABOLIC PANEL
ALT: 38 U/L (ref 0–44)
AST: 42 U/L — ABNORMAL HIGH (ref 15–41)
Albumin: 3.9 g/dL (ref 3.5–5.0)
Alkaline Phosphatase: 74 U/L (ref 38–126)
Anion gap: 13 (ref 5–15)
BUN: 16 mg/dL (ref 6–20)
CO2: 22 mmol/L (ref 22–32)
Calcium: 8.9 mg/dL (ref 8.9–10.3)
Chloride: 102 mmol/L (ref 98–111)
Creatinine, Ser: 1.14 mg/dL (ref 0.61–1.24)
GFR, Estimated: >60 mL/min (ref >60)
Glucose, Bld: 167 mg/dL — ABNORMAL HIGH (ref 70–99)
Potassium: 3 mmol/L — ABNORMAL LOW (ref 3.5–5.1)
Sodium: 137 mmol/L (ref 135–145)
Total Bilirubin: 1.5 mg/dL — ABNORMAL HIGH (ref <1.2)
Total Protein: 7.7 g/dL (ref 6.5–8.1)

## 2023-01-20 LAB — RAPID URINE DRUG SCREEN, HOSP PERFORMED
Amphetamines: POSITIVE — AB
Barbiturates: NOT DETECTED
Benzodiazepines: POSITIVE — AB
Cocaine: NOT DETECTED
Opiates: NOT DETECTED
Tetrahydrocannabinol: POSITIVE — AB

## 2023-01-20 LAB — CBC
HCT: 49.5 % (ref 39.0–52.0)
Hemoglobin: 16.3 g/dL (ref 13.0–17.0)
MCH: 29.5 pg (ref 26.0–34.0)
MCHC: 32.9 g/dL (ref 30.0–36.0)
MCV: 89.5 fL (ref 80.0–100.0)
Platelets: 139 10*3/uL — ABNORMAL LOW (ref 150–400)
RBC: 5.53 MIL/uL (ref 4.22–5.81)
RDW: 13.1 % (ref 11.5–15.5)
WBC: 7.2 10*3/uL (ref 4.0–10.5)
nRBC: 0 % (ref 0.0–0.2)

## 2023-01-20 LAB — ETHANOL: Alcohol, Ethyl (B): <10 mg/dL (ref <10)

## 2023-01-20 LAB — SALICYLATE LEVEL: Salicylate Lvl: <7 mg/dL — ABNORMAL LOW (ref 7.0–30.0)

## 2023-01-20 LAB — ACETAMINOPHEN LEVEL: Acetaminophen (Tylenol), Serum: <10 ug/mL — ABNORMAL LOW (ref 10–30)

## 2023-01-20 MED ORDER — CYCLOBENZAPRINE HCL 10 MG PO TABS
10.0000 mg | ORAL_TABLET | Freq: Two times a day (BID) | ORAL | Status: DC
  Administered 2023-01-20: 10 mg via ORAL
  Filled 2023-01-20: qty 1

## 2023-01-20 MED ORDER — SERTRALINE HCL 25 MG PO TABS
25.0000 mg | ORAL_TABLET | Freq: Every day | ORAL | Status: DC
  Administered 2023-01-21: 25 mg via ORAL
  Filled 2023-01-20: qty 1

## 2023-01-20 MED ORDER — ALUM & MAG HYDROXIDE-SIMETH 200-200-20 MG/5ML PO SUSP: 15.0000 mL | Freq: Four times a day (QID) | ORAL | Status: DC | PRN

## 2023-01-20 MED ORDER — GABAPENTIN 300 MG PO CAPS
1200.0000 mg | ORAL_CAPSULE | Freq: Three times a day (TID) | ORAL | Status: DC
  Administered 2023-01-20 (×2): 1200 mg via ORAL
  Filled 2023-01-20 (×2): qty 4

## 2023-01-20 MED ORDER — AMLODIPINE BESYLATE 5 MG PO TABS
5.0000 mg | ORAL_TABLET | Freq: Once | ORAL | Status: AC
  Administered 2023-01-20: 5 mg via ORAL
  Filled 2023-01-20: qty 1

## 2023-01-20 MED ORDER — METOPROLOL TARTRATE 25 MG PO TABS
100.0000 mg | ORAL_TABLET | Freq: Two times a day (BID) | ORAL | Status: DC
  Administered 2023-01-20: 100 mg via ORAL
  Filled 2023-01-20: qty 4

## 2023-01-20 MED ORDER — SERTRALINE HCL 50 MG PO TABS
25.0000 mg | ORAL_TABLET | Freq: Every day | ORAL | Status: DC
  Administered 2023-01-20: 25 mg via ORAL
  Filled 2023-01-20: qty 1

## 2023-01-20 MED ORDER — QUETIAPINE FUMARATE 100 MG PO TABS: 300.0000 mg | ORAL_TABLET | Freq: Every day | ORAL | Status: DC

## 2023-01-20 MED ORDER — GABAPENTIN 400 MG PO CAPS
1200.0000 mg | ORAL_CAPSULE | Freq: Three times a day (TID) | ORAL | Status: DC
  Administered 2023-01-21 (×2): 1200 mg via ORAL
  Filled 2023-01-20 (×2): qty 3

## 2023-01-20 MED ORDER — POTASSIUM CHLORIDE CRYS ER 20 MEQ PO TBCR
40.0000 meq | EXTENDED_RELEASE_TABLET | Freq: Once | ORAL | Status: AC
  Administered 2023-01-20: 40 meq via ORAL
  Filled 2023-01-20: qty 2

## 2023-01-20 MED ORDER — MAGNESIUM HYDROXIDE 400 MG/5ML PO SUSP: 15.0000 mL | Freq: Every day | ORAL | Status: DC | PRN

## 2023-01-20 MED ORDER — SODIUM CHLORIDE 0.9 % IV BOLUS
1000.0000 mL | Freq: Once | INTRAVENOUS | Status: AC
  Administered 2023-01-20: 1000 mL via INTRAVENOUS

## 2023-01-20 MED ORDER — ACETAMINOPHEN 325 MG PO TABS
650.0000 mg | ORAL_TABLET | Freq: Four times a day (QID) | ORAL | Status: DC | PRN
  Administered 2023-01-21 (×2): 650 mg via ORAL
  Filled 2023-01-20 (×2): qty 2

## 2023-01-20 MED ORDER — LISINOPRIL 10 MG PO TABS
20.0000 mg | ORAL_TABLET | Freq: Every day | ORAL | Status: DC
  Administered 2023-01-20: 20 mg via ORAL
  Filled 2023-01-20: qty 2

## 2023-01-20 MED ORDER — LISINOPRIL 20 MG PO TABS
20.0000 mg | ORAL_TABLET | Freq: Every day | ORAL | Status: DC
  Administered 2023-01-21: 20 mg via ORAL
  Filled 2023-01-20: qty 1

## 2023-01-20 MED ORDER — QUETIAPINE FUMARATE 300 MG PO TABS: 300.0000 mg | ORAL_TABLET | Freq: Every day | ORAL | Status: DC

## 2023-01-20 MED ORDER — METOPROLOL TARTRATE 50 MG PO TABS: 100.0000 mg | ORAL_TABLET | Freq: Two times a day (BID) | ORAL | Status: DC

## 2023-01-20 MED ORDER — QUETIAPINE FUMARATE 50 MG PO TABS
25.0000 mg | ORAL_TABLET | Freq: Every day | ORAL | Status: DC
  Administered 2023-01-20: 25 mg via ORAL
  Filled 2023-01-20: qty 1

## 2023-01-20 NOTE — BH Assessment (Signed)
Disposition Note:   Per [facility] provider Dahlia Byes, NP, the patient was assessed and will benefit from a transfer to [facility] for treatment of depression. [facility] charge nurse Candi Leash, RN, was notified of the recommendation and asked to review the patient for admission.

## 2023-01-20 NOTE — ED Provider Notes (Signed)
[name] EMERGENCY DEPARTMENT AT [facility] Provider Note   CSN: [id number] Arrival date & time: 01/20/23  1128     History  Chief Complaint  Patient presents with   Paranoid    Travis [name] is a 57 y.o. male.  57 year old male with past medical history of polysubstance abuse in the past and hypertension presenting to the emergency department today with apparent suicidal ideations.  The patient states that over the past few days he "cannot trust myself because I have thought about walking into traffic".  He states that he "wishes he was not here".  He tells me that he is not having any obvious visual or auditory hallucinations.  The patient denies any physical complaints at this time.  He states that he has had suicidal ideations in the past but denies any attempts.  He states that he has not been on any medications now for the past few weeks.  He is not sure which medications he supposed to be on.        Home Medications Prior to Admission medications   Medication Sig Start Date End Date Taking? Authorizing Provider  acetaminophen (TYLENOL) 500 MG tablet Take 1,000 mg by mouth every 6 (six) hours as needed for mild pain or moderate pain.   Yes [provider]  CLARITIN 10 MG tablet Take 10 mg by mouth daily as needed for allergies. 08/17/20  Yes [provider]  cyclobenzaprine (FLEXERIL) 10 MG tablet Take 10 mg by mouth 2 (two) times daily. 08/17/20  Yes [provider]  gabapentin (NEURONTIN) 600 MG tablet Take 1,200 mg by mouth 3 (three) times daily. 01/09/23  Yes [provider]  lisinopril (ZESTRIL) 20 MG tablet Take 20 mg by mouth daily. 02/11/15  Yes [provider]  metoprolol tartrate (LOPRESSOR) 100 MG tablet Take 100 mg by mouth 2 (two) times daily. 08/17/20  Yes [provider]  QUEtiapine (SEROQUEL) 200 MG tablet Take 300 mg by mouth at bedtime. 12/31/22  Yes [provider]  famotidine (PEPCID)  20 MG tablet Take 20 mg by mouth daily as needed for heartburn or indigestion. Patient not taking: Reported on 01/20/2023 08/17/20   [provider]  metFORMIN (GLUCOPHAGE-XR) 500 MG 24 hr tablet Take 500 mg by mouth daily with breakfast. Patient not taking: Reported on 01/20/2023 01/10/23   [provider]  pantoprazole (PROTONIX) 40 MG tablet Take 1 tablet (40 mg total) by mouth daily. Patient not taking: Reported on 01/20/2023 10/07/20   Bethann Berkshire, MD      Allergies    Vaccinium angustifolium, Blueberry flavor, Penicillins, Robaxin [methocarbamol], and Toradol [ketorolac tromethamine]    Review of Systems   Review of Systems  Psychiatric/Behavioral:  Positive for suicidal ideas.     Physical Exam Updated Vital Signs BP (!) 175/108 (BP Location: Left Arm)   Pulse 78   Temp 98.1 F (36.7 C) (Oral)   Resp 16   Ht 6' (1.829 m)   Wt 117 kg   SpO2 99%   BMI 34.99 kg/m  Physical Exam Vitals and nursing note reviewed.   Gen: NAD Eyes: PERRL, EOMI HEENT: no oropharyngeal swelling Neck: trachea midline Resp: clear to auscultation bilaterally Card: RRR, no murmurs, rubs, or gallops Abd: nontender, nondistended Extremities: no calf tenderness, no edema Vascular: 2+ radial pulses bilaterally, 2+ DP pulses bilaterally Skin: no rashes Psyc: poor eye contact, guarded   ED Results / Procedures / Treatments   Labs (all labs ordered are listed,  but only abnormal results are displayed) Labs Reviewed  COMPREHENSIVE METABOLIC PANEL - Abnormal; Notable for the following components:      Result Value   Potassium 3.0 (*)    Glucose, Bld 167 (*)    AST 42 (*)    Total Bilirubin 1.5 (*)    All other components within normal limits  SALICYLATE LEVEL - Abnormal; Notable for the following components:   Salicylate Lvl <7.0 (*)    All other components within normal limits  ACETAMINOPHEN LEVEL - Abnormal; Notable for the following components:   Acetaminophen (Tylenol),  Serum <10 (*)    All other components within normal limits  CBC - Abnormal; Notable for the following components:   Platelets 139 (*)    All other components within normal limits  RAPID URINE DRUG SCREEN, HOSP PERFORMED - Abnormal; Notable for the following components:   Benzodiazepines POSITIVE (*)    Amphetamines POSITIVE (*)    Tetrahydrocannabinol POSITIVE (*)    All other components within normal limits  ETHANOL    EKG EKG Interpretation Date/Time:  Monday January 20 2023 12:49:44 EST Ventricular Rate:  112 PR Interval:  163 QRS Duration:  95 QT Interval:  354 QTC Calculation: 484 R Axis:   -24  Text Interpretation: Sinus tachycardia Borderline left axis deviation Abnormal R-wave progression, late transition Borderline prolonged QT interval Confirmed by Beckey Downing [id number]) on 01/20/2023 1:09:33 PM  Radiology DG Chest Portable 1 View  Result Date: 01/20/2023 CLINICAL DATA:  Confusion. EXAM: PORTABLE CHEST 1 VIEW COMPARISON:  Chest radiograph dated March 23, 2021 FINDINGS: Low lung volumes. The heart size and mediastinal contours are otherwise within normal limits. No focal consolidation. No sizable pleural effusion or pneumothorax. No acute osseous abnormality. IMPRESSION: Low lung volumes.  No acute cardiopulmonary findings. Electronically Signed   By: Hart Robinsons M.D.   On: 01/20/2023 15:42    Procedures Procedures    Medications Ordered in ED Medications  QUEtiapine (SEROQUEL) tablet 25 mg (has no administration in time range)  sertraline (ZOLOFT) tablet 25 mg (25 mg Oral Given 01/20/23 1553)  sodium chloride 0.9 % bolus 1,000 mL (0 mLs Intravenous Stopped 01/20/23 1430)  potassium chloride SA (KLOR-CON M) CR tablet 40 mEq (40 mEq Oral Given 01/20/23 1552)  amLODipine (NORVASC) tablet 5 mg (5 mg Oral Given 01/20/23 1600)    ED Course/ Medical Decision Making/ A&P                                 Medical Decision Making 57 year old male with past medical  history of hypertension, polysubstance abuse in the past, and tobacco abuse presenting to the emergency department today with suicidal ideations.  The patient was tachycardic here on arrival and his pulse ox is 93%.  His heart rate seems to have improved on my exam.  I will repeat his vital signs here.  Will obtain a chest x-ray and EKG here.  The patient is denying any complaints consistent with this.  Obtain labs here for medical clearance.  I will have the patient evaluated by TTS.  Patient is here voluntarily so we will hold off on IVC at this time.  The patient's labs are large unremarkable.  His heart rate improved with fluids here.  He was evaluated by psychiatry and they recommend inpatient treatment.  He is medically cleared for psychiatric evaluation.  The patient's blood pressure was elevated here.  He is unsure what  blood pressure medications he is post to be on.  He is given amlodipine here in the emergency department.  Amount and/or Complexity of Data Reviewed Labs: ordered. Radiology: ordered.  Risk Prescription drug management.           Final Clinical Impression(s) / ED Diagnoses Final diagnoses:  Suicidal ideation    Rx / DC Orders ED Discharge Orders     None         Durwin Glaze, MD 01/20/23 1705

## 2023-01-20 NOTE — ED Notes (Signed)
Pt changed into burgundy scrub top and blue disposable pants. Pt has 1 belonging's bag that contains: shirt, pants, hoodie, socks, and shoes. Bag LABELED and placed in cabinet 16-18. Security wanded pt

## 2023-01-20 NOTE — Progress Notes (Addendum)
Pt was accepted to [facility]) 01/20/2023; Bed Assignment Facility [facility]) Address: [address], [place], [name] [address]  Pt meets inpatient criteria per [name], NP-PMHNP-BC   Attending Physician will be Dr. Nilsa Nutting  Report can be called to:  [phone].   Pt can arrive after: TRANSFER READY  Care Team notified: [facility] [name], [name],RN, [name], [name], Counselor, [name], [name], NP, [name], [name], NP-PMHNP-BC, [name], NP     [name], LCSWA 01/20/2023 @ 10:52 PM

## 2023-01-20 NOTE — ED Provider Notes (Signed)
[facility] Continuous Assessment Admission H&P  Date: 01/21/23 Patient Name: Travis [name] MRN: [id number] Chief Complaint: " I blacked out yesterday"  Diagnoses:  Final diagnoses:  Adjustment disorder with mixed anxiety and depressed mood  Homelessness  Difficulty coping with new situations  Polysubstance abuse (HCC)    Travis [name] is a 57 y.o. male patient with Psychiatry history of anxiety, MDD, PTSD, Suicide attempt, and Polysubstance abuse  presenting as a direct admit to [facility] after he was taken to the ED by EMS from [facility] stating that he needs help and is having trouble trusting himself and his judgement.  Patient reported not taking his medications x 3 three days and does not remember the Medications he is taking, while reporting paranoia.  Patient was seen face to face by this provider and chart reviewed. He reported at the ED being stressed and depressed, with a major factor being homelessness after his sister whom he lives with kicked him out of the house.  On evaluation, patient is drowsy (was given hs meds at the ED), at baseline, and cooperative. Speech is clear and also garbled/muffled at times. Pt appears disheveled with no eye contact as he struggles to stay awake. Mood is anxious and irritable, affect is congruent with mood. Thought process is goal oriented and thought content is WDL. Pt denies SI/HI/AVH or paranoia. There is no objective indication that the patient is responding to internal stimuli. No delusions elicited during this assessment.    Patient reports "I'm going through a lot of stuff I can't deal with, my sister kicked me out and yesterday, I took a brown gummy, and benzos and blacked out, but they said they found amphetamines in my system, but I don't know what was in the gummy".  Pt reports "I blacked out yesterday, and I don't remember part of the day, I only remember I took a gummy I bought, and I've been clean for a couple of months and yesterday I  relapsed after I took the gummy, I bought it yesterday, it was my first time and I don't remember who sold it to me, but I remember up to a certain point, that's it".  Patient currently denies SI, stating " I don't feel suicidal right now".  Patient reports he takes prescription medications and visited his PCP last month.   Patient denies ongoing illicit substance use, stressing the drugs in his system was done yesterday due to stress and he has been sober for a long time.   Support, encouragement and reassurance provided about ongoing stressors. Patient is provided with opportunity for questions.     Discussed admission to the continuous observation unit overnight for safety monitoring and re-eval in the am, as he does not meet [facility] admission criteria at this time. Patient verbalized his understanding and is in agreement.    Total Time spent with patient: 20 minutes  Musculoskeletal  Strength & Muscle Tone: decreased Gait & Station: unsteady Patient leans: N/A  Psychiatric Specialty Exam  Presentation General Appearance:  Disheveled  Eye Contact: None  Speech: Garbled  Speech Volume: Normal  Handedness: Right   Mood and Affect  Mood: Anxious; Irritable  Affect: Congruent   Thought Process  Thought Processes: Goal Directed  Descriptions of Associations:Intact  Orientation:Partial  Thought Content:WDL    Hallucinations:Hallucinations: None  Ideas of Reference:None  Suicidal Thoughts:Suicidal Thoughts: No SI Active Intent and/or Plan: Without Plan  Homicidal Thoughts:Homicidal Thoughts: No   Sensorium  Memory: Immediate Fair  Judgment: Poor  Insight: Poor   Executive Functions  Concentration: Poor  Attention Span: Poor  Recall: Poor  Fund of Knowledge: Poor  Language: Poor   Psychomotor Activity  Psychomotor Activity: Psychomotor Activity: Normal   Assets  Assets: Communication Skills; Desire for Improvement   Sleep   Sleep: Sleep: Fair   Nutritional Assessment (For OBS and FBC admissions only) Has the patient had a weight loss or gain of 10 pounds or more in the last 3 months?: No Has the patient had a decrease in food intake/or appetite?: No Does the patient have dental problems?: No Does the patient have eating habits or behaviors that may be indicators of an eating disorder including binging or inducing vomiting?: No Has the patient recently lost weight without trying?: 0 Has the patient been eating poorly because of a decreased appetite?: 0 Malnutrition Screening Tool Score: 0    Physical Exam Constitutional:      General: He is not in acute distress.    Appearance: He is obese. He is not diaphoretic.  HENT:     Nose: No congestion.  Eyes:     General:        Right eye: No discharge.        Left eye: No discharge.  Cardiovascular:     Rate and Rhythm: Normal rate.  Pulmonary:     Effort: No respiratory distress.  Chest:     Chest wall: No tenderness.  Neurological:     Mental Status: Mental status is at baseline.  Psychiatric:        Attention and Perception: He is inattentive. He does not perceive auditory or visual hallucinations.        Mood and Affect: Mood is anxious and depressed.        Speech: Speech is slurred.        Behavior: Behavior is cooperative.        Thought Content: Thought content normal.        Cognition and Memory: Cognition normal.    Review of Systems  Constitutional:  Negative for chills, diaphoresis and fever.  HENT:  Negative for congestion.   Eyes:  Negative for discharge.  Respiratory:  Negative for cough, shortness of breath and wheezing.   Cardiovascular:  Negative for chest pain and palpitations.  Gastrointestinal:  Negative for diarrhea, nausea and vomiting.  Neurological:  Negative for dizziness, seizures, loss of consciousness, weakness and headaches.  Psychiatric/Behavioral:  Positive for substance abuse. The patient is nervous/anxious.      Blood pressure (!) 148/89, pulse 62, temperature 98.4 F (36.9 C), temperature source Oral, resp. rate 18, SpO2 100%. There is no height or weight on file to calculate BMI.  Past Psychiatric History: See H & P   Is the patient at risk to self? No  Has the patient been a risk to self in the past 6 months? Yes .    Has the patient been a risk to self within the distant past? Yes   Is the patient a risk to others? No   Has the patient been a risk to others in the past 6 months? No   Has the patient been a risk to others within the distant past? No   Past Medical History: See Chart  Family History: N/A  Social History: N/A  Last Labs:  Admission on 01/20/2023, Discharged on 01/20/2023  Component Date Value Ref Range Status   Sodium 01/20/2023 137  135 - 145 mmol/L Final   Potassium 01/20/2023 3.0 (L)  3.5 - 5.1 mmol/L Final   Chloride 01/20/2023 102  98 - 111 mmol/L Final   CO2 01/20/2023 22  22 - 32 mmol/L Final   Glucose, Bld 01/20/2023 167 (H)  70 - 99 mg/dL Final   Glucose reference range applies only to samples taken after fasting for at least 8 hours.   BUN 01/20/2023 16  6 - 20 mg/dL Final   Creatinine, Ser 01/20/2023 1.14  0.61 - 1.24 mg/dL Final   Calcium 40/98/1191 8.9  8.9 - 10.3 mg/dL Final   Total Protein 47/82/9562 7.7  6.5 - 8.1 g/dL Final   Albumin 13/10/6576 3.9  3.5 - 5.0 g/dL Final   AST 46/96/2952 42 (H)  15 - 41 U/L Final   ALT 01/20/2023 38  0 - 44 U/L Final   Alkaline Phosphatase 01/20/2023 74  38 - 126 U/L Final   Total Bilirubin 01/20/2023 1.5 (H)  <1.2 mg/dL Final   GFR, Estimated 01/20/2023 >60  >60 mL/min Final   Comment: (NOTE) Calculated using the CKD-EPI Creatinine Equation (2021)    Anion gap 01/20/2023 13  5 - 15 Final   Performed at [facility], [address] [place], [name] [address]   Alcohol, Ethyl (B) 01/20/2023 <10  <10 mg/dL Final   Comment: (NOTE) Lowest detectable limit for serum alcohol is 10 mg/dL.  For  medical purposes only. Performed at [facility], [address], [place], [name] [address]    Salicylate Lvl 01/20/2023 <7.0 (L)  7.0 - 30.0 mg/dL Final   Performed at [facility], [address], [place], [name] [address]   Acetaminophen (Tylenol), Serum 01/20/2023 <10 (L)  10 - 30 ug/mL Final   Comment: (NOTE) Therapeutic concentrations vary significantly. A range of 10-30 ug/mL  may be an effective concentration for many patients. However, some  are best treated at concentrations outside of this range. Acetaminophen concentrations >150 ug/mL at 4 hours after ingestion  and >50 ug/mL at 12 hours after ingestion are often associated with  toxic reactions.  Performed at [facility], [address] [place], [name] [address]    WBC 01/20/2023 7.2  4.0 - 10.5 K/uL Final   RBC 01/20/2023 5.53  4.22 - 5.81 MIL/uL Final   Hemoglobin 01/20/2023 16.3  13.0 - 17.0 g/dL Final   HCT 34/74/2595 49.5  39.0 - 52.0 % Final   MCV 01/20/2023 89.5  80.0 - 100.0 fL Final   MCH 01/20/2023 29.5  26.0 - 34.0 pg Final   MCHC 01/20/2023 32.9  30.0 - 36.0 g/dL Final   RDW 63/87/5643 13.1  11.5 - 15.5 % Final   Platelets 01/20/2023 139 (L)  150 - 400 K/uL Final   Comment: SPECIMEN CHECKED FOR CLOTS REPEATED TO VERIFY    nRBC 01/20/2023 0.0  0.0 - 0.2 % Final   Performed at [facility], [address] [place], [name] [address]   Opiates 01/20/2023 NONE DETECTED  NONE DETECTED Final   Cocaine 01/20/2023 NONE DETECTED  NONE DETECTED Final   Benzodiazepines 01/20/2023 POSITIVE (A)  NONE DETECTED Final   Amphetamines 01/20/2023 POSITIVE (A)  NONE DETECTED Final   Tetrahydrocannabinol 01/20/2023 POSITIVE (A)  NONE DETECTED Final   Barbiturates 01/20/2023 NONE DETECTED  NONE DETECTED Final   Comment: (NOTE) DRUG SCREEN FOR MEDICAL PURPOSES ONLY.  IF CONFIRMATION IS NEEDED FOR ANY PURPOSE, NOTIFY LAB WITHIN 5  DAYS.  LOWEST DETECTABLE LIMITS FOR URINE DRUG SCREEN Drug Class  Cutoff (ng/mL) Amphetamine and metabolites    1000 Barbiturate and metabolites    200 Benzodiazepine                 200 Opiates and metabolites        300 Cocaine and metabolites        300 THC                            50 Performed at [facility], [address], [place], [name] [address]     Allergies: Vaccinium angustifolium, Blueberry flavor, Penicillins, Robaxin [methocarbamol], and Toradol [ketorolac tromethamine]  Medications:  Facility Ordered Medications  Medication   [COMPLETED] sodium chloride 0.9 % bolus 1,000 mL   [COMPLETED] potassium chloride SA (KLOR-CON M) CR tablet 40 mEq   [COMPLETED] amLODipine (NORVASC) tablet 5 mg   acetaminophen (TYLENOL) tablet 650 mg   alum & mag hydroxide-simeth (MAALOX/MYLANTA) 200-200-20 MG/5ML suspension 15 mL   magnesium hydroxide (MILK OF MAGNESIA) suspension 15 mL   gabapentin (NEURONTIN) capsule 1,200 mg   lisinopril (ZESTRIL) tablet 20 mg   QUEtiapine (SEROQUEL) tablet 300 mg   sertraline (ZOLOFT) tablet 25 mg   nicotine (NICODERM CQ - dosed in mg/24 hours) patch 21 mg   PTA Medications  Medication Sig   acetaminophen (TYLENOL) 500 MG tablet Take 1,000 mg by mouth every 6 (six) hours as needed for mild pain or moderate pain.   pantoprazole (PROTONIX) 40 MG tablet Take 1 tablet (40 mg total) by mouth daily. (Patient not taking: Reported on 01/20/2023)   metoprolol tartrate (LOPRESSOR) 100 MG tablet Take 100 mg by mouth 2 (two) times daily.   famotidine (PEPCID) 20 MG tablet Take 20 mg by mouth daily as needed for heartburn or indigestion. (Patient not taking: Reported on 01/20/2023)   CLARITIN 10 MG tablet Take 10 mg by mouth daily as needed for allergies.   cyclobenzaprine (FLEXERIL) 10 MG tablet Take 10 mg by mouth 2 (two) times daily.   gabapentin (NEURONTIN) 600 MG tablet Take 1,200 mg by mouth 3 (three) times daily.    lisinopril (ZESTRIL) 20 MG tablet Take 20 mg by mouth daily.   QUEtiapine (SEROQUEL) 200 MG tablet Take 300 mg by mouth at bedtime.   metFORMIN (GLUCOPHAGE-XR) 500 MG 24 hr tablet Take 500 mg by mouth daily with breakfast. (Patient not taking: Reported on 01/20/2023)      Medical Decision Making  Recommend admission to Continuous observation unit overnight and re-eval in am. Recommend social work consult due to homelessness and other psychosocial challenges.   No Labs ordered in [facility]. I reviewed the labs and medications administered at   [facility] :Uds positive for Amphetamines, Benzos and THC., EKG abnormal, Alcohol level WNL, CMP and CBC w/diff non remarkable.  Patient given Klor-Con for Potassium level 3.0,  ED meds administered include Zoloft, Lisinopril, Flexeril, Sertraline, Gabapentin, Lopressor, Seroquel and IV fluids.  Meds reordered -Gabapentin 1200 mg PO, TID neuropathic pain -Zestril 20 mg PO daily for HTN -Seroquel 300 mg PO Daily at bedtime for mood stabilization -Sertraline 25 mg, PO daily for depression and anxiety symptom -Nicoderm 21 mg Transdermal patch for nicotine dependence  Other Prns -Tylenol, Maalox, MOM  Recommendations  Based on my evaluation the patient does not appear to have an emergency medical condition.  Recommend admission to Continuous observation unit overnight for safety monitoring and re-eval in am. Recommend social work consult due to homelessness and other psychosocial  challenges.  Mancel Bale, NP 01/21/23  2:24 AM

## 2023-01-20 NOTE — ED Notes (Signed)
EDP informed RN that patient now states he wants to walk into incoming traffic.

## 2023-01-20 NOTE — ED Triage Notes (Signed)
Per [facility] pt coming from [facility] nurses station- states he needs help and is having trouble trusting his own judgement. States he has not taken his meds for past 3 days and that he is unsure of what meds he is supposed to be taking. States he feels very paranoid. Denies SI/HI in triage.
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
## 2023-01-21 ENCOUNTER — Other Ambulatory Visit: Payer: Self-pay

## 2023-01-21 ENCOUNTER — Encounter (HOSPITAL_COMMUNITY): Payer: Self-pay

## 2023-01-21 ENCOUNTER — Emergency Department (HOSPITAL_COMMUNITY)
Admission: EM | Admit: 2023-01-21 | Discharge: 2023-01-21 | Discharge status: Against Medical Advice | Payer: Medicaid Other | Source: Home / Self Care

## 2023-01-21 ENCOUNTER — Emergency Department (HOSPITAL_COMMUNITY)
Admission: EM | Admit: 2023-01-21 | Discharge: 2023-01-23 | Disposition: A | Discharge status: Other Acute Inpatient Hospital | Payer: Medicaid Other | Source: Home / Self Care | Attending: Emergency Medicine | Admitting: Emergency Medicine

## 2023-01-21 DIAGNOSIS — I10 Essential (primary) hypertension: Secondary | ICD-10-CM | POA: Insufficient documentation

## 2023-01-21 DIAGNOSIS — Z79899 Other long term (current) drug therapy: Secondary | ICD-10-CM | POA: Insufficient documentation

## 2023-01-21 DIAGNOSIS — X789XXA Intentional self-harm by unspecified sharp object, initial encounter: Secondary | ICD-10-CM | POA: Insufficient documentation

## 2023-01-21 DIAGNOSIS — Z20822 Contact with and (suspected) exposure to covid-19: Secondary | ICD-10-CM | POA: Insufficient documentation

## 2023-01-21 DIAGNOSIS — F1920 Other psychoactive substance dependence, uncomplicated: Secondary | ICD-10-CM | POA: Insufficient documentation

## 2023-01-21 DIAGNOSIS — S61512A Laceration without foreign body of left wrist, initial encounter: Secondary | ICD-10-CM | POA: Insufficient documentation

## 2023-01-21 DIAGNOSIS — F333 Major depressive disorder, recurrent, severe with psychotic symptoms: Secondary | ICD-10-CM | POA: Insufficient documentation

## 2023-01-21 DIAGNOSIS — M25511 Pain in right shoulder: Secondary | ICD-10-CM | POA: Insufficient documentation

## 2023-01-21 DIAGNOSIS — F4323 Adjustment disorder with mixed anxiety and depressed mood: Secondary | ICD-10-CM | POA: Diagnosis present

## 2023-01-21 DIAGNOSIS — Z5321 Procedure and treatment not carried out due to patient leaving prior to being seen by health care provider: Secondary | ICD-10-CM | POA: Insufficient documentation

## 2023-01-21 DIAGNOSIS — F332 Major depressive disorder, recurrent severe without psychotic features: Secondary | ICD-10-CM

## 2023-01-21 DIAGNOSIS — Z7289 Other problems related to lifestyle: Secondary | ICD-10-CM

## 2023-01-21 DIAGNOSIS — F17210 Nicotine dependence, cigarettes, uncomplicated: Secondary | ICD-10-CM | POA: Insufficient documentation

## 2023-01-21 DIAGNOSIS — S61511A Laceration without foreign body of right wrist, initial encounter: Secondary | ICD-10-CM | POA: Insufficient documentation

## 2023-01-21 DIAGNOSIS — W010XXA Fall on same level from slipping, tripping and stumbling without subsequent striking against object, initial encounter: Secondary | ICD-10-CM | POA: Insufficient documentation

## 2023-01-21 DIAGNOSIS — M545 Low back pain, unspecified: Secondary | ICD-10-CM | POA: Insufficient documentation

## 2023-01-21 DIAGNOSIS — Z5900 Homelessness unspecified: Secondary | ICD-10-CM

## 2023-01-21 MED ORDER — GABAPENTIN 400 MG PO CAPS
1200.0000 mg | ORAL_CAPSULE | Freq: Once | ORAL | Status: AC
  Administered 2023-01-22: 1200 mg via ORAL
  Filled 2023-01-21: qty 3

## 2023-01-21 MED ORDER — KETOROLAC TROMETHAMINE 30 MG/ML IJ SOLN
30.0000 mg | Freq: Once | INTRAMUSCULAR | Status: AC
  Administered 2023-01-22: 30 mg via INTRAMUSCULAR
  Filled 2023-01-21: qty 1

## 2023-01-21 MED ORDER — NICOTINE 21 MG/24HR TD PT24
21.0000 mg | MEDICATED_PATCH | Freq: Every day | TRANSDERMAL | Status: DC
  Administered 2023-01-21: 21 mg via TRANSDERMAL
  Filled 2023-01-21: qty 1

## 2023-01-21 MED ORDER — SERTRALINE HCL 25 MG PO TABS: 25.0000 mg | ORAL_TABLET | Freq: Every day | ORAL | 0 refills | Status: DC
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
## 2023-01-21 NOTE — ED Notes (Signed)
Pt here voluntarily from [facility]. Pt states he is depressed, anxious and suicidal. Pt says he doesn't know why his sister doesn't want him living with her anymore. Says he has been living with her for 3 years. Pt endorses SI with plan to walk into traffic. Pt denies intent. Pt endorses AVH saying he sees objects moving and hears screams. Pt has had one previous suicide attempt in 2015. Pt UDS was positive for THC, benzos and amphetamines. Pt admits to using these substances yesterday. Pt also vapes nicotine daily. Pt is calm and cooperative with assessment. Pt in Adult Bed 1.

## 2023-01-21 NOTE — Progress Notes (Signed)
Pt is resting quietly. Pt complained of back pain. No signs of acute distress noted. PRN Acetaminophen and scheduled meds administered per order. Pt endorses AVH,"hearing screams and seeing linen carts move." Pt denies current SI/HI, plan or intent. Staff will monitor for pt's safety.
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
## 2023-01-21 NOTE — ED Notes (Signed)
Pt has been dressed out into scrubs. Belongings placed in locker (x3 bags of clothes in a locker.)   Pt is resting in bed at this time.

## 2023-01-21 NOTE — ED Provider Notes (Signed)
FBC/OBS ASAP Discharge Summary  Date and Time: 01/21/2023 2:19 PM  Name: Travis [name]  MRN:  [id number]   Discharge Diagnoses:  Final diagnoses:  Adjustment disorder with mixed anxiety and depressed mood  Homelessness  Difficulty coping with new situations  Polysubstance abuse [facility])   Stay Summary: Travis [name] is a 57 y/o male patient with Psychiatry history of anxiety, MDD, PTSD, Suicide attempt, and Polysubstance abuse admitted to continuous assessment unit after presenting from [facility] ED where he initially presented via EMS from the [facility]) with complaints of paranoia, off meds for 3 days, and trouble trusting himself.    Travis [name] reassessed face-to-face by this provider, chart reviewed, and consulted with Dr. Nelly Rout on 01/21/23 On evaluation, Travis [name] is lying in bed with no noted distress.  He is alert, oriented x 4, calm, and cooperative.  His speech is clear, coherent, at normal rate, and moderate volume. He maintained good eye contact throughout assessment.  His mood is anxious and dysphoric with congruent affect.  Patient denies suicidal/self-harm/homicidal ideation, psychosis, and paranoia.  He/She reports  Objectively there is no evidence of psychosis/mania or delusional thinking.  His responses to assessment questions were relevant and appropriate.  He conversed coherently, with goal directed thoughts, no distractibility, or pre-occupation.  Patient states that he is currently homeless with no place to stay.  Patient states he was kicked out of his sisters house when she found drugs and that he had been doing drugs.  States this has been an ongoing argument with his sister.  Patient states his las use 3 days ago when kicked out of sisters house.  Patient states he has no where to go .    At this time patient is educated and verbalizes understanding of mental health resources and other crisis services in the community. He is instructed to call 911 and  present to the nearest emergency room should he experience any suicidal/homicidal ideation, auditory/visual/hallucinations, or detrimental worsening of his mental health condition.  Patient also instructed that he could call the toll-free phone number on back of insurance card/Medicaid card to get assist with identifying counselors and agencies in network or those with Medicaid can speak with a Medicaid care coordinator.   Total Time spent with patient: 45 minutes  Past Psychiatric History: anxiety, MDD, PTSD, Suicide attempt, and Polysubstance abuse  Past Medical History:  Past Medical History:  Diagnosis Date   Anxiety    Panic attacks   Chest pain    Hospital, March, 2014, negative enzymes, patient refused in-hospital  stress test, patient canceled outpatient stress test   Constipation    Degenerative disk disease    Degenerative disk disease    Depression    GERD (gastroesophageal reflux disease)    Hypertension    Incontinence of urine    Neuromuscular disorder (HCC)    Obesity    Polysubstance abuse (HCC)    PTSD (post-traumatic stress disorder)    Pulmonary emboli (HCC)    Seizures (HCC)    Shortness of breath dyspnea    with exertion   Spinal stenosis    Spinal stenosis    Suicide attempt (HCC)    Tachycardia - pulse    Tobacco abuse     Family History:  Family History  Problem Relation Age of Onset   Stroke Mother    Hypertension Mother    Hyperlipidemia Mother    Stroke Father    Hypertension Father    Dementia Father  Diabetes Father    Heart disease Father    Hyperlipidemia Father    Cancer Sister        brain   Diabetes Sister    Heart disease Sister    Hyperlipidemia Sister    Hypertension Sister    Stroke Sister    Heart disease Brother    Hyperlipidemia Brother     Family Psychiatric History: Unaware Social History: Homeless Social History   Socioeconomic History   Marital status: Divorced    Spouse name: Not on file   Number of  children: Not on file   Years of education: Not on file   Highest education level: Not on file  Occupational History   Not on file  Tobacco Use   Smoking status: Every Day    Current packs/day: 0.00    Types: Cigarettes    Start date: 12/12/2016    Last attempt to quit: 12/12/2016    Years since quitting: 6.1   Smokeless tobacco: Never  Vaping Use   Vaping status: Every Day   Substances: Nicotine  Substance and Sexual Activity   Alcohol use: No   Drug use: Yes    Types: Amphetamines, Marijuana, Benzodiazepines   Sexual activity: Never  Other Topics Concern   Not on file  Social History Narrative   Not on file   Social Determinants of Health   Financial Resource Strain: High Risk (01/09/2023)   Received from [facility]   Overall Financial Resource Strain (CARDIA)    Difficulty of Paying Living Expenses: Hard  Food Insecurity: Food Insecurity Present (01/21/2023)   Hunger Vital Sign    Worried About Running Out of Food in the Last Year: Sometimes true    Ran Out of Food in the Last Year: Sometimes true  Transportation Needs: Unmet Transportation Needs (01/21/2023)   PRAPARE - Administrator, Civil Service (Medical): Yes    Lack of Transportation (Non-Medical): Yes  Physical Activity: Inactive (01/09/2023)   Received from [facility]   Exercise Vital Sign    Days of Exercise per Week: 0 days    Minutes of Exercise per Session: 10 min  Stress: Stress Concern Present (01/09/2023)   Received from [facility] - Occupational Stress Questionnaire    Feeling of Stress : Rather much  Social Connections: Moderately Integrated (01/09/2023)   [facility] from [facility]   Social Network    How would you rate your social network (family, work, friends)?: Adequate participation with social networks  Intimate Partner Violence: Not At Risk (01/21/2023)   Humiliation, Afraid, Rape, and Kick questionnaire    Fear of Current  or Ex-Partner: No    Emotionally Abused: No    Physically Abused: No    Sexually Abused: No    Tobacco Cessation:  A prescription for an FDA-approved tobacco cessation medication was offered at discharge and the patient refused  Current Medications:  Current Facility-Administered Medications  Medication Dose Route Frequency Provider Last Rate Last Admin   acetaminophen (TYLENOL) tablet 650 mg  650 mg Oral Q6H PRN Onuoha, Chinwendu V, NP   650 mg at 01/21/23 0836   alum & mag hydroxide-simeth (MAALOX/MYLANTA) 200-200-20 MG/5ML suspension 15 mL  15 mL Oral Q6H PRN Onuoha, Chinwendu V, NP       gabapentin (NEURONTIN) capsule 1,200 mg  1,200 mg Oral TID Onuoha, Chinwendu V, NP   1,200 mg at 01/21/23 0831   lisinopril (ZESTRIL) tablet 20 mg  20  mg Oral Daily Onuoha, Chinwendu V, NP   20 mg at 01/21/23 0831   magnesium hydroxide (MILK OF MAGNESIA) suspension 15 mL  15 mL Oral Daily PRN Onuoha, Chinwendu V, NP       nicotine (NICODERM CQ - dosed in mg/24 hours) patch 21 mg  21 mg Transdermal Daily Onuoha, Chinwendu V, NP   21 mg at 01/21/23 0832   QUEtiapine (SEROQUEL) tablet 300 mg  300 mg Oral QHS Onuoha, Chinwendu V, NP       sertraline (ZOLOFT) tablet 25 mg  25 mg Oral Daily Onuoha, Chinwendu V, NP   25 mg at 01/21/23 1610   Current Outpatient Medications  Medication Sig Dispense Refill   acetaminophen (TYLENOL) 500 MG tablet Take 1,000 mg by mouth every 6 (six) hours as needed for mild pain or moderate pain.     CLARITIN 10 MG tablet Take 10 mg by mouth daily as needed for allergies.     cyclobenzaprine (FLEXERIL) 10 MG tablet Take 10 mg by mouth 2 (two) times daily.     famotidine (PEPCID) 20 MG tablet Take 20 mg by mouth daily as needed for heartburn or indigestion. (Patient not taking: Reported on 01/20/2023)     gabapentin (NEURONTIN) 600 MG tablet Take 1,200 mg by mouth 3 (three) times daily.     lisinopril (ZESTRIL) 20 MG tablet Take 20 mg by mouth daily.     metFORMIN (GLUCOPHAGE-XR)  500 MG 24 hr tablet Take 500 mg by mouth daily with breakfast. (Patient not taking: Reported on 01/20/2023)     metoprolol tartrate (LOPRESSOR) 100 MG tablet Take 100 mg by mouth 2 (two) times daily.     pantoprazole (PROTONIX) 40 MG tablet Take 1 tablet (40 mg total) by mouth daily. (Patient not taking: Reported on 01/20/2023) 30 tablet 0   QUEtiapine (SEROQUEL) 200 MG tablet Take 300 mg by mouth at bedtime.      PTA Medications:  Facility Ordered Medications  Medication   [COMPLETED] sodium chloride 0.9 % bolus 1,000 mL   [COMPLETED] potassium chloride SA (KLOR-CON M) CR tablet 40 mEq   [COMPLETED] amLODipine (NORVASC) tablet 5 mg   acetaminophen (TYLENOL) tablet 650 mg   alum & mag hydroxide-simeth (MAALOX/MYLANTA) 200-200-20 MG/5ML suspension 15 mL   magnesium hydroxide (MILK OF MAGNESIA) suspension 15 mL   gabapentin (NEURONTIN) capsule 1,200 mg   lisinopril (ZESTRIL) tablet 20 mg   QUEtiapine (SEROQUEL) tablet 300 mg   sertraline (ZOLOFT) tablet 25 mg   nicotine (NICODERM CQ - dosed in mg/24 hours) patch 21 mg   PTA Medications  Medication Sig   acetaminophen (TYLENOL) 500 MG tablet Take 1,000 mg by mouth every 6 (six) hours as needed for mild pain or moderate pain.   pantoprazole (PROTONIX) 40 MG tablet Take 1 tablet (40 mg total) by mouth daily. (Patient not taking: Reported on 01/20/2023)   metoprolol tartrate (LOPRESSOR) 100 MG tablet Take 100 mg by mouth 2 (two) times daily.   famotidine (PEPCID) 20 MG tablet Take 20 mg by mouth daily as needed for heartburn or indigestion. (Patient not taking: Reported on 01/20/2023)   CLARITIN 10 MG tablet Take 10 mg by mouth daily as needed for allergies.   cyclobenzaprine (FLEXERIL) 10 MG tablet Take 10 mg by mouth 2 (two) times daily.   gabapentin (NEURONTIN) 600 MG tablet Take 1,200 mg by mouth 3 (three) times daily.   lisinopril (ZESTRIL) 20 MG tablet Take 20 mg by mouth daily.   QUEtiapine (SEROQUEL) 200  MG tablet Take 300 mg by  mouth at bedtime.   metFORMIN (GLUCOPHAGE-XR) 500 MG 24 hr tablet Take 500 mg by mouth daily with breakfast. (Patient not taking: Reported on 01/20/2023)       08/26/2014    4:11 PM 04/07/2014   11:30 AM 08/10/2013   10:27 AM  Depression screen PHQ 2/9  Decreased Interest 2 1 0  Down, Depressed, Hopeless 2 3 0  PHQ - 2 Score 4 4 0  Altered sleeping 3 3   Tired, decreased energy 3 2   Change in appetite 2 2   Feeling bad or failure about yourself  3 0   Trouble concentrating 3 1   Moving slowly or fidgety/restless 0 0   Suicidal thoughts 1 0   PHQ-9 Score 19 12     Flowsheet Row ED from 01/20/2023 in [facility] Most recent reading at 01/21/2023 12:00 AM ED from 01/20/2023 in [facility] Emergency Department at [facility] Most recent reading at 01/20/2023 11:39 AM ED to Hosp-Admission (Discharged) from 03/23/2021 in [facility] Most recent reading at 03/25/2021 12:00 AM  C-SSRS RISK CATEGORY Moderate Risk No Risk No Risk       Musculoskeletal  Strength & Muscle Tone: within normal limits Gait & Station: normal Patient leans: N/A  Psychiatric Specialty Exam  Presentation  General Appearance:  Appropriate for Environment  Eye Contact: Good  Speech: Clear and Coherent; Normal Rate  Speech Volume: Normal  Handedness: Right   Mood and Affect  Mood: Anxious; Dysphoric  Affect: Congruent   Thought Process  Thought Processes: Coherent; Goal Directed  Descriptions of Associations:Intact  Orientation:Full (Time, Place and Person)  Thought Content:Logical; WDL     Hallucinations:Hallucinations: None  Ideas of Reference:None  Suicidal Thoughts:Suicidal Thoughts: No SI Active Intent and/or Plan: Without Plan  Homicidal Thoughts:Homicidal Thoughts: No   Sensorium  Memory: Immediate Fair; Recent Good  Judgment: Intact  Insight: Fair; Present   Executive Functions   Concentration: Good  Attention Span: Good  Recall: Good  Fund of Knowledge: Good  Language: Good   Psychomotor Activity  Psychomotor Activity: Psychomotor Activity: Normal   Assets  Assets: Communication Skills; Desire for Improvement; Financial Resources/Insurance; Leisure Time; Physical Health   Sleep  Sleep: Sleep: Good   Nutritional Assessment (For OBS and FBC admissions only) Has the patient had a weight loss or gain of 10 pounds or more in the last 3 months?: No Has the patient had a decrease in food intake/or appetite?: No Does the patient have dental problems?: No Does the patient have eating habits or behaviors that may be indicators of an eating disorder including binging or inducing vomiting?: No Has the patient recently lost weight without trying?: 0 Has the patient been eating poorly because of a decreased appetite?: 0 Malnutrition Screening Tool Score: 0    Physical Exam  Physical Exam Vitals and nursing note reviewed. Exam conducted with a chaperone present.  Constitutional:      General: He is not in acute distress.    Appearance: Normal appearance. He is not ill-appearing.  Cardiovascular:     Rate and Rhythm: Normal rate.  Pulmonary:     Effort: Pulmonary effort is normal.  Musculoskeletal:        General: Normal range of motion.     Cervical back: Normal range of motion.  Skin:    General: Skin is warm and dry.  Neurological:     Mental  Status: He is alert and oriented to person, place, and time.    Review of Systems  Constitutional:        No other complaints voiced  Musculoskeletal:  Positive for back pain, joint pain and myalgias.  Psychiatric/Behavioral:  Positive for depression and substance abuse. Negative for hallucinations. Suicidal ideas: denies.The patient is nervous/anxious.   All other systems reviewed and are negative.  Blood pressure (!) 157/97, pulse 71, temperature 98.4 F (36.9 C), temperature source Oral,  resp. rate 18, SpO2 100%. There is no height or weight on file to calculate BMI.  Demographic Factors:  Male, Caucasian, and Unemployed  Loss Factors: Homeless  Historical Factors: NA  Risk Reduction Factors:   Sense of responsibility to family  Continued Clinical Symptoms:  Previous Psychiatric Diagnoses and Treatments  Cognitive Features That Contribute To Risk:  None    Suicide Risk:  Minimal: No identifiable suicidal ideation.  Patients presenting with no risk factors but with morbid ruminations; may be classified as minimal risk based on the severity of the depressive symptoms  Plan Of Care/Follow-up recommendations:  Other:  Follow up with resources given  Disposition: No evidence of imminent risk to self or others at present.   Patient does not meet criteria for psychiatric inpatient admission. Supportive therapy provided about ongoing stressors. Refer to IOP. Discussed crisis plan, support from social network, calling 911, coming to the Emergency Department, and calling Suicide Hotline.  Kobie Matkins, NP 01/21/2023, 2:19 PM

## 2023-01-21 NOTE — ED Notes (Signed)
APAP & Gabapentin administered before d/c. Pt d/c to home with AVS. AVS reviewed prior to discharge. Pt alert, oriented, and ambulatory. Safety maintained.

## 2023-01-21 NOTE — Discharge Instructions (Addendum)
 [facility]: Outpatient psychiatric Services:   Please see the walk in hours listed below.  Medication Management New Patient needing Medication Management Walk-in, and Existing Patients needing to see a provider for management coming as a walk in   Monday thru Friday 8:00 AM first come first serve until slots are full.  Recommend being there by 7:15 AM to ensure a slot is open.  Therapy New Patient Therapy Intake and Existing Patients needing to see therapist coming in as a walk in.   Monday, Wednesday, and Thursday morning at 8:00 am first come first serve.  Recommend being there by 7:15 AM to ensure a slot is open.    Every 1st, 2nd, and 3rd Friday at 1:00 PM first come first serve until slots are full.  Will still need to come in that morning at 7:15 AM to get registered for an afternoon slot.  For all walk-ins we ask that you arrive by 7:15 am because patients will be seen in there order of arrival (FIRST COME FIRST SERVE) Availability is limited, therefore you may not be seen on the same day that you walk in if all slots are full.    Our goal is to serve and meet the needs of our community to the best of our ability.     [facility] Phone: [phone] Physical Address:  [address], Suite [place], [name]  [address]  Outpatient Services Life can be a challenge for [place] all. [facility] outpatient services offer a caring and experienced team of professionals who help people take the first step, which is often the most difficult. Together, we develop a well-defined and customized plan for each person that meets the individual's needs and goals. Each plan includes evidence-based practices as proven strategies that work. From board-certified psychiatrists, registered nurses, therapists, and outpatient office administrative professionals--all care and want to help you and your loved ones in every way  possible to ensure you succeed.  Open Access:   One way we ensure we get people the help they need when they request is is through Open Access. This service encourages individuals who are in dire need of our services and are new to [facility] to simply walk in or call us for virtual options, Monday through Friday between 8 a.m. and 3 p.m. On the same day of contact, if the individual has time to do so, he/she/they will complete patient registration and a comprehensive clinical assessment with a therapist. The assessment will provide treatment recommendations and the individual will leave with an appointment for the next service or a referral to the proper level of care.  While this process takes a few hours and is longer than a traditional appointment, it reduces what could otherwise be months of waiting for help or an appointment.   Telehealth Services:  [facility]'s telehealth services provide a safe, secure, and easy way to connect with a therapist or mental health provider for an individual or group therapy appointment. Click here to learn more about how [facility] telehealth services provide an important treatment option. These services may be accessed from the comfort of an individual's home, or at one of [facility]'s behavioral health offices such as this one where an individual may use on-site equipment for the visit.   Telehealth Services   A SAFE, SECURE, CONVENIENT TREATMENT OPTION:  [facility] telehealth services provide you with a safe, secure, and easy way to connect with your therapist or mental health provider for  an individual or group therapy appointment.  Using Psychologist, prison and probation services, telehealth appointments allow you to meet with [employer], therapists, nurse practitioners, and psychiatrists from your desktop or laptop computer, cell phone, or tablet device. Telehealth visits are compliant with all Health Insurance Portability and Accountability Act (HIPAA) requirements and you can  complete a telehealth visit from just about anywhere using internet or wi-fi access.  HOW DOES IT WORK?  [facility] uses the Doxy.me platform to host telehealth appointments. Prior to your scheduled visit, you will receive a direct link via text or email which will take you to your provider's online waiting room. Simply click that link at your appointment time and your provider will be notified that you've arrived. He or she will meet you online and you will complete your visit. Your provider may also have resources and information posted in his or her virtual waiting room which you may find helpful throughout your treatment.  In addition, you may receive a reminder telephone call from a [facility] team member in the days leading up to your appointment. During that call, you will have an opportunity to provide important health information and medication updates which may save time during your scheduled appointment.     WHO USES TELEHEALTH SERVICES?  Telehealth services provide an alternative to in-person, face-to-face treatment for individuals receiving outpatient behavioral health services. At [facility], telehealth visits may also be used by individuals receiving Assertive Community Treatment (ACT) Team and Individual Placement and Support (IPS) services and other community-based, specialized services as needed. Telehealth services are also used for group therapy sessions, allowing people we support to connect during treatment with others who have similar experiences. Interactive Resource Center  Hours Monday - Friday: Services: 8:00AM - 3:00PM Offices: 8:00AM - 5:00PM  Physical Address [address] [place], [name] [address]   Please use this address for [facility] Mailing Address [address] [address] [place], [name] [address]  [facility] helps people reconnect This is a safe place to rest, take care of basic needs and access the services and community that make all the difference. Our guests come to the [facility] to  take a class, do laundry, meet with a case manager or to get their mail. Sometimes they just need to sit in our dayroom and enjoy a conversation.  Here you will find everything from shower facilities to a computer lab, a mail room, classrooms and meeting spaces.  The [facility] helps people reconnect with their own lives and with the community at large.  A caring community setting One of the most exciting aspects of the [facility] is that so many individuals and organizations in the community are a part of the everyday experience. Whether it's a hair stylist or law firm offering services right in-house, our partners make the [facility] a truly interactive resource center where services are brought to our guests. The IRC brings together a comprehensive community of talented people who not only want to help solve problems, but also to be a part of our guests' lives.  Integrated Care We take a person-centered approach to assistance that includes: Case management Geneticist, molecular Medical clinic Mental health nurse Referrals  Fundamental Services We start with necessities: Midwife and Armed forces operational officer addresses and mailboxes Replacement IDs Onsite barbershop Storage lockers [place] winter warming center  Self-Sufficiency We connect our guests with: Skilled trade classes Job skills classes Resume and jobs application assistance Interview training GED Producer, television/film/video literacy  [employer] Address:  [address] [place], [name] [address] Phone: [phone]  Supported Employment The supported employment program is a person-centered, individualized, evidence-based support service that helps members choose, acquire, and maintain competitive employment in our community. This service supports the varying needs of individuals and promotes community inclusion and employment success. Members enrolled in the supported employment program can  expect the following:  Development of an individual career plan Community based job placement Job shadowing Job development On-site job Furniture conservator/restorer and support  Supported Education Supported education helps our members receive the education and training they need to achieve their learning and recovery goals. This will assist members with becoming gainfully employed in the job or career of their choice. The program includes assistance with: Registering for disability accommodations Enrolling in school and registering for classes Learning communication skills Scheduling tutoring sessions within your school [facility] partners with Vocational Rehabilitation to help increase the success of clients seeking employment and educational goals.  Want to learn more about our programs?   Please contact our intake department INTAKE: [phone] Ext [phone]  Mailing: [address] [address]   [place], [name] [address]   [email or web address]             Substance Abuse Treatment Programs  Intensive Outpatient Programs [facility]     [address]      [place], [name]                   [phone]       [facility] [address] [place], [name] [phone]  [facility]     (Inpatient and outpatient)     [address]           [phone]    [facility] [phone] (Suboxone and Methadone)  [address]      [place], [name] [address]      [phone]       [address] [place], [name] [phone]  [facility] (Outpatient/Inpatient, [employer])    (insurance only) [phone]             [facility] (Groups & Residential) [place], [name] [phone]     [facility]     [address]     [place], [name]      [phone]       [facility] (for caregivers and family) [phone] [address] [place],  [name] [phone]      Residential Treatment Programs [facility]      [address] [place], [name] [address]  [phone]       [facility] [address], [place], [name] [address] [phone]  [facility] [place]        [phone]       [facility] [phone]  [facility] ([facility]             [address]                                         [place], [name]  [phone] or [phone]                               [facility] [place] [address] [place], [address] [phone]  [facility]    [address]      [place], [name]     [phone]       [facility] [address] [place], [name] [phone]  [facility]   [address] [place], [name] [address]     [phone]      Admissions: 8am-3pm M-F  [facility] (RT[facility]) [address] [place], [name] [phone]  [facility] Program: Residential Program [phone] Days)   [place], [name]      [phone] or [phone]     AD[facility]C: [facility] [place], [name] (Walk in Hours over the weekend or by referral)  [facility] [address] [place], [place], [name] [address] [phone]  Crisis Mobile: [facility]:  [phone] (for crisis response 24 hours a day) [facility] Hotline:      [phone] Outpatient Psychiatry and Counseling  [facility]: Mobile Crisis Management 24 hours:  [phone]  [facility] of the [employer] sliding scale fee and walk in schedule: M-F 8am-12pm/1pm-3pm [address]  [place], [name] [address] [phone]  [facility] [address] [place], [name] [address] [phone]  [facility] (Formerly known as [facility] [employer])- new patient walk-in appointments available Monday - Friday 8am -3pm.          [address] [place], [name] [address] [phone] or crisis line- [phone]  [facility] Outpatient Services/ Intensive Outpatient Therapy Program [address] [place], [name] [address] [phone]  [facility] Mental Health                  Crisis Services      [phone] [address]     [place], [name] [address]                 [facility]   [facility] [phone]. [address] [place], [name] [address]   [employer] [facility]          [address]  [place], [name] [address]       [phone]  [facility] [address] [place], [name] [address] [phone]  [facility]    [address]    [place], [name] [address]     [phone]       Andee Poles, MD     [address]     [name] [address]     [phone]       [facility] [address] [name] [address]  [facility]     [address] [place], [name]      [phone]       [facility] Eulogio Ditch, MD [address] [place], [name] [address] [phone]  [facility]     [address]     [place], [name] [address]     [phone]       [facility] [address] [place], [name] [address] [phone] Resources for Temporary  Residential Assistance/Crisis Theme park manager [facility]) M-F 8am-3pm   [address] [place], [name] [address]   [phone] Services include: laundry, barbering, support groups, case management, phone  & computer access, showers, AA/NA mtgs, mental health/substance abuse nurse, job skills class, disability information, VA assistance, spiritual classes, etc.   HOMELESS SHELTERS  [facility]     [facility]   [address] [name]      [phone]              [employer] (women and children)       [address] [place], [name] [address] [phone] [email or web address] for application and process Application Required  [facility] [employer] [facility]   [address]    [name] [address]     [phone]                    [facility] [address] [place], [name] [address] [phone] application appt.) Application Required  [facility] (women only)    [address]     [place], [name] [address]     [phone]      Intake starts 6pm daily Need valid ID, SSC, & Police report [employer] [address] [place], [name] [phone] Application Required  [employer] (men only)     [address]      [place], [name]     [phone]       [facility] [place] (Pregnant women only) [address] [place], [name] [phone]  [facility]      [address]      [place], [name] [address]     [phone]             [facility] [address] [place], [name] [phone] 90 day commitment/SA/Application process  [facility](men only)     [address]     [place], [name]     [phone]       Check-in at [facility] [address] [place], [name] [address] [phone] Men/Women/Women and Children must be there by 7 pm  [facility] [place], [name] [phone]

## 2023-01-21 NOTE — ED Notes (Signed)
Pt asleep at this hour. No apparent distress. RR even and unlabored. Monitored for safety.  

## 2023-01-22 DIAGNOSIS — F332 Major depressive disorder, recurrent severe without psychotic features: Secondary | ICD-10-CM

## 2023-01-22 DIAGNOSIS — F333 Major depressive disorder, recurrent, severe with psychotic symptoms: Secondary | ICD-10-CM

## 2023-01-22 LAB — SARS CORONAVIRUS 2 BY RT PCR: SARS Coronavirus 2 by RT PCR: NEGATIVE

## 2023-01-22 MED ORDER — QUETIAPINE FUMARATE 100 MG PO TABS: 300.0000 mg | ORAL_TABLET | Freq: Every day | ORAL | Status: DC

## 2023-01-22 MED ORDER — SERTRALINE HCL 50 MG PO TABS: 25.0000 mg | ORAL_TABLET | Freq: Every day | ORAL | Status: DC

## 2023-01-22 MED ORDER — QUETIAPINE FUMARATE 100 MG PO TABS
200.0000 mg | ORAL_TABLET | Freq: Every day | ORAL | Status: DC
  Administered 2023-01-22: 200 mg via ORAL
  Filled 2023-01-22 (×2): qty 2

## 2023-01-22 MED ORDER — LISINOPRIL 10 MG PO TABS
20.0000 mg | ORAL_TABLET | Freq: Every day | ORAL | Status: DC
  Administered 2023-01-22: 20 mg via ORAL
  Filled 2023-01-22: qty 2

## 2023-01-22 MED ORDER — ACETAMINOPHEN 500 MG PO TABS
1000.0000 mg | ORAL_TABLET | Freq: Four times a day (QID) | ORAL | Status: DC | PRN
  Administered 2023-01-22: 1000 mg via ORAL
  Filled 2023-01-22: qty 2

## 2023-01-22 MED ORDER — NICOTINE 21 MG/24HR TD PT24
21.0000 mg | MEDICATED_PATCH | Freq: Once | TRANSDERMAL | Status: DC
  Administered 2023-01-22: 21 mg via TRANSDERMAL
  Filled 2023-01-22: qty 1

## 2023-01-22 MED ORDER — GABAPENTIN 400 MG PO CAPS
1200.0000 mg | ORAL_CAPSULE | Freq: Three times a day (TID) | ORAL | Status: DC
  Administered 2023-01-22 (×3): 1200 mg via ORAL
  Filled 2023-01-22 (×3): qty 3

## 2023-01-22 MED ORDER — AMLODIPINE BESYLATE 5 MG PO TABS
5.0000 mg | ORAL_TABLET | Freq: Every day | ORAL | Status: DC
  Administered 2023-01-22: 5 mg via ORAL
  Filled 2023-01-22: qty 1

## 2023-01-22 MED ORDER — METOPROLOL TARTRATE 50 MG PO TABS: 100.0000 mg | ORAL_TABLET | Freq: Two times a day (BID) | ORAL | Status: DC

## 2023-01-22 MED ORDER — CYCLOBENZAPRINE HCL 10 MG PO TABS
10.0000 mg | ORAL_TABLET | Freq: Two times a day (BID) | ORAL | Status: DC
  Administered 2023-01-22 (×2): 10 mg via ORAL
  Filled 2023-01-22 (×2): qty 1

## 2023-01-22 MED ORDER — QUETIAPINE FUMARATE 100 MG PO TABS
100.0000 mg | ORAL_TABLET | Freq: Every day | ORAL | Status: DC
  Administered 2023-01-22: 100 mg via ORAL
  Filled 2023-01-22: qty 1

## 2023-01-22 MED ORDER — PANTOPRAZOLE SODIUM 40 MG PO TBEC
40.0000 mg | DELAYED_RELEASE_TABLET | Freq: Every day | ORAL | Status: DC
  Administered 2023-01-22: 40 mg via ORAL
  Filled 2023-01-22: qty 1

## 2023-01-22 MED ORDER — LORATADINE 10 MG PO TABS: 10.0000 mg | ORAL_TABLET | Freq: Every day | ORAL | Status: DC | PRN

## 2023-01-22 MED ORDER — POTASSIUM CHLORIDE 20 MEQ PO PACK
40.0000 meq | PACK | Freq: Once | ORAL | Status: AC
  Administered 2023-01-22: 40 meq via ORAL
  Filled 2023-01-22: qty 2

## 2023-01-22 MED ORDER — FAMOTIDINE 20 MG PO TABS: 20.0000 mg | ORAL_TABLET | Freq: Every day | ORAL | Status: DC | PRN

## 2023-01-22 MED ORDER — METFORMIN HCL ER 500 MG PO TB24
500.0000 mg | ORAL_TABLET | Freq: Every day | ORAL | Status: DC
  Filled 2023-01-22 (×2): qty 1

## 2023-01-22 MED ORDER — SERTRALINE HCL 50 MG PO TABS
25.0000 mg | ORAL_TABLET | Freq: Every day | ORAL | Status: DC
  Administered 2023-01-22: 25 mg via ORAL
  Filled 2023-01-22: qty 1

## 2023-01-22 MED ORDER — HYDROCODONE-ACETAMINOPHEN 5-325 MG PO TABS
1.0000 | ORAL_TABLET | Freq: Once | ORAL | Status: AC
  Administered 2023-01-22: 1 via ORAL
  Filled 2023-01-22: qty 1
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
## 2023-01-22 NOTE — [facility] Notes (Signed)
Pt was accepted to [facility] 01/22/2023 ; Bed Assignment PENDING Signed Voluntary consent uploaded to pt's chart or faxed to [facility] Fax: [phone] and re-evaluation of pt's VS due to elevated BP.  Address: [address] [place], [place], [name] [address]  Pt meets inpatient criteria per Maryagnes Amos, FNP   Attending Physician will be Dr. Shellee Milo  Report can be called to: [phone]  Pt can arrive after: [facility] and Charge [facility] RN will coordinate with the care team.  Care Team notified: Day [facility] Rona Ravens, RN, Night [facility] Kim Brooks,RN,Dakota [place], Kalen [place], Alycia [place],RN, Adelene Idler Starkes-[place],FNP     Maryjean Ka, MSW, [facility] 01/22/2023 11:20 PM
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
## 2023-01-22 NOTE — ED Notes (Signed)
Pt in bed with eyes closed, resps even and unlabored, pt is mildly diaphoretic, pt arouses easily to verbal stim, upon arousal pt states that he is still si, continues to contract for safety, pt reports 10/10 back pain.  Pt states that he doesn't know the last time he took his bp medication.

## 2023-01-22 NOTE — ED Provider Notes (Addendum)
Emergency Medicine Observation Re-evaluation Note  Travis [name] is a 57 y.o. male, seen on rounds today.  Pt initially presented to the ED for complaints of Suicide Attempt Currently, the patient is not having any acute complaints.  Physical Exam  BP (!) 174/124 (BP Location: Right Arm)   Pulse 92   Temp 98.2 F (36.8 C) (Oral)   Resp 19   Ht 6' (1.829 m)   Wt 117 kg   SpO2 94%   BMI 34.98 kg/m  Physical Exam General: Resting comfortably in stretcher Lungs: Normal work of breathing Psych: Calm  ED Course / MDM  EKG:   I have reviewed the labs performed to date as well as medications administered while in observation.  Recent changes in the last 24 hours include seen by psychiatry who recommends observation and they will reassess him.  Plan  Current plan is for psychiatric reassessment today.  Asked for blood pressure control medication to facilitate placement.  Has been ordered for his home lisinopril and have added amlodipine.    Rondel Baton, MD 01/22/23 2055    Rondel Baton, MD 01/22/23 2056

## 2023-01-23 ENCOUNTER — Other Ambulatory Visit: Payer: Self-pay

## 2023-01-23 ENCOUNTER — Inpatient Hospital Stay
Admission: AD | Admit: 2023-01-23 | Discharge: 2023-02-11 | DRG: 885 | Disposition: A | Discharge status: Home / Self Care | Payer: Medicaid Other | Source: Intra-hospital | Attending: Psychiatry | Admitting: Psychiatry

## 2023-01-23 ENCOUNTER — Encounter: Payer: Self-pay | Admitting: Family

## 2023-01-23 DIAGNOSIS — Z86711 Personal history of pulmonary embolism: Secondary | ICD-10-CM | POA: Diagnosis not present

## 2023-01-23 DIAGNOSIS — R45851 Suicidal ideations: Secondary | ICD-10-CM | POA: Diagnosis present

## 2023-01-23 DIAGNOSIS — F332 Major depressive disorder, recurrent severe without psychotic features: Secondary | ICD-10-CM | POA: Diagnosis not present

## 2023-01-23 DIAGNOSIS — E669 Obesity, unspecified: Secondary | ICD-10-CM | POA: Diagnosis present

## 2023-01-23 DIAGNOSIS — Z8249 Family history of ischemic heart disease and other diseases of the circulatory system: Secondary | ICD-10-CM

## 2023-01-23 DIAGNOSIS — F17210 Nicotine dependence, cigarettes, uncomplicated: Secondary | ICD-10-CM | POA: Diagnosis present

## 2023-01-23 DIAGNOSIS — Z79899 Other long term (current) drug therapy: Secondary | ICD-10-CM

## 2023-01-23 DIAGNOSIS — Z7984 Long term (current) use of oral hypoglycemic drugs: Secondary | ICD-10-CM | POA: Diagnosis not present

## 2023-01-23 DIAGNOSIS — Z833 Family history of diabetes mellitus: Secondary | ICD-10-CM | POA: Diagnosis not present

## 2023-01-23 DIAGNOSIS — Z823 Family history of stroke: Secondary | ICD-10-CM | POA: Diagnosis not present

## 2023-01-23 DIAGNOSIS — Z5941 Food insecurity: Secondary | ICD-10-CM | POA: Diagnosis not present

## 2023-01-23 DIAGNOSIS — F4310 Post-traumatic stress disorder, unspecified: Secondary | ICD-10-CM | POA: Diagnosis present

## 2023-01-23 DIAGNOSIS — F410 Panic disorder [episodic paroxysmal anxiety] without agoraphobia: Secondary | ICD-10-CM | POA: Diagnosis present

## 2023-01-23 DIAGNOSIS — F333 Major depressive disorder, recurrent, severe with psychotic symptoms: Secondary | ICD-10-CM | POA: Diagnosis present

## 2023-01-23 DIAGNOSIS — F1210 Cannabis abuse, uncomplicated: Secondary | ICD-10-CM

## 2023-01-23 DIAGNOSIS — Z5982 Transportation insecurity: Secondary | ICD-10-CM | POA: Diagnosis not present

## 2023-01-23 DIAGNOSIS — F17290 Nicotine dependence, other tobacco product, uncomplicated: Secondary | ICD-10-CM | POA: Diagnosis present

## 2023-01-23 DIAGNOSIS — I10 Essential (primary) hypertension: Secondary | ICD-10-CM | POA: Diagnosis present

## 2023-01-23 DIAGNOSIS — F1510 Other stimulant abuse, uncomplicated: Secondary | ICD-10-CM | POA: Insufficient documentation

## 2023-01-23 DIAGNOSIS — Z5900 Homelessness unspecified: Secondary | ICD-10-CM

## 2023-01-23 DIAGNOSIS — Z5986 Financial insecurity: Secondary | ICD-10-CM | POA: Diagnosis not present

## 2023-01-23 DIAGNOSIS — F259 Schizoaffective disorder, unspecified: Secondary | ICD-10-CM | POA: Insufficient documentation

## 2023-01-23 DIAGNOSIS — Z83438 Family history of other disorder of lipoprotein metabolism and other lipidemia: Secondary | ICD-10-CM | POA: Diagnosis not present

## 2023-01-23 MED ORDER — NICOTINE 21 MG/24HR TD PT24
21.0000 mg | MEDICATED_PATCH | Freq: Every day | TRANSDERMAL | Status: DC
  Administered 2023-01-23 – 2023-01-25 (×3): 21 mg via TRANSDERMAL
  Filled 2023-01-23 (×3): qty 1

## 2023-01-23 MED ORDER — LORAZEPAM 1 MG PO TABS
1.0000 mg | ORAL_TABLET | ORAL | Status: DC | PRN
  Administered 2023-01-25 – 2023-01-28 (×14): 1 mg via ORAL
  Filled 2023-01-23 (×15): qty 1

## 2023-01-23 MED ORDER — CYCLOBENZAPRINE HCL 10 MG PO TABS
10.0000 mg | ORAL_TABLET | Freq: Two times a day (BID) | ORAL | Status: DC
  Administered 2023-01-23 – 2023-01-24 (×3): 10 mg via ORAL
  Filled 2023-01-23 (×3): qty 1

## 2023-01-23 MED ORDER — MAGNESIUM HYDROXIDE 400 MG/5ML PO SUSP
30.0000 mL | Freq: Every day | ORAL | Status: DC | PRN
Start: 2023-01-23 — End: 2023-02-11

## 2023-01-23 MED ORDER — CLONIDINE HCL 0.1 MG PO TABS: 0.2000 mg | ORAL_TABLET | Freq: Three times a day (TID) | ORAL | Status: DC | PRN

## 2023-01-23 MED ORDER — PANTOPRAZOLE SODIUM 40 MG PO TBEC
40.0000 mg | DELAYED_RELEASE_TABLET | Freq: Every day | ORAL | Status: DC
  Administered 2023-01-23 – 2023-02-11 (×20): 40 mg via ORAL
  Filled 2023-01-23 (×20): qty 1

## 2023-01-23 MED ORDER — METFORMIN HCL ER 500 MG PO TB24
500.0000 mg | ORAL_TABLET | Freq: Every day | ORAL | Status: DC
  Administered 2023-01-24 – 2023-02-11 (×15): 500 mg via ORAL
  Filled 2023-01-23 (×20): qty 1

## 2023-01-23 MED ORDER — LISINOPRIL 20 MG PO TABS
20.0000 mg | ORAL_TABLET | Freq: Every day | ORAL | Status: DC
  Administered 2023-01-23 – 2023-01-25 (×3): 20 mg via ORAL
  Filled 2023-01-23 (×3): qty 1

## 2023-01-23 MED ORDER — METOPROLOL TARTRATE 25 MG PO TABS
100.0000 mg | ORAL_TABLET | Freq: Every day | ORAL | Status: DC
Start: 2023-01-23 — End: 2023-01-25
  Administered 2023-01-23 – 2023-01-25 (×3): 100 mg via ORAL
  Filled 2023-01-23 (×3): qty 4

## 2023-01-23 MED ORDER — GABAPENTIN 400 MG PO CAPS
1200.0000 mg | ORAL_CAPSULE | Freq: Three times a day (TID) | ORAL | Status: DC
  Administered 2023-01-23 – 2023-01-29 (×19): 1200 mg via ORAL
  Filled 2023-01-23 (×19): qty 3

## 2023-01-23 MED ORDER — ALUM & MAG HYDROXIDE-SIMETH 200-200-20 MG/5ML PO SUSP
30.0000 mL | ORAL | Status: DC | PRN
Start: 2023-01-23 — End: 2023-02-11

## 2023-01-23 MED ORDER — FAMOTIDINE 20 MG PO TABS
20.0000 mg | ORAL_TABLET | Freq: Every day | ORAL | Status: DC | PRN
  Administered 2023-01-24: 20 mg via ORAL
  Filled 2023-01-23: qty 1

## 2023-01-23 MED ORDER — QUETIAPINE FUMARATE 200 MG PO TABS
200.0000 mg | ORAL_TABLET | Freq: Every day | ORAL | Status: DC
  Administered 2023-01-23: 200 mg via ORAL
  Filled 2023-01-23: qty 1

## 2023-01-23 MED ORDER — ESCITALOPRAM OXALATE 10 MG PO TABS
10.0000 mg | ORAL_TABLET | Freq: Every day | ORAL | Status: DC
  Administered 2023-01-23 – 2023-02-04 (×13): 10 mg via ORAL
  Filled 2023-01-23 (×13): qty 1

## 2023-01-23 MED ORDER — OLANZAPINE 5 MG PO TABS: 10.0000 mg | ORAL_TABLET | Freq: Four times a day (QID) | ORAL | Status: DC | PRN

## 2023-01-23 MED ORDER — ACETAMINOPHEN 325 MG PO TABS
650.0000 mg | ORAL_TABLET | Freq: Four times a day (QID) | ORAL | Status: DC | PRN
  Administered 2023-01-23 – 2023-02-09 (×26): 650 mg via ORAL
  Filled 2023-01-23 (×25): qty 2

## 2023-01-23 MED ORDER — ACETAMINOPHEN 500 MG PO TABS: 1000.0000 mg | ORAL_TABLET | Freq: Four times a day (QID) | ORAL | Status: DC | PRN

## 2023-01-23 NOTE — Group Note (Signed)
Recreation Therapy Group Note   Group Topic:Problem Solving  Group Date: 01/23/2023 Start Time: 1400 End Time: 1500 Facilitators: Rosina Lowenstein, LRT, CTRS Location:  Day Room  Group Description: Life Boat. Patients were given the scenario that they are on a boat that is about to become shipwrecked, leaving them stranded on an [place]. They are asked to make a list of 15 different items that they want to take with them when they are stranded on the [place]. Patients are asked to rank their items from most important to least important, #1 being the most important and #15 being the least. Patients will work individually for the first round to come up with 15 items and then pair up with a peer(s) to condense their list and come up with one list of 15 items between the two of them. Patients or LRT will read aloud the 15 different items to the group after each round. LRT facilitated post-activity processing to discuss how this activity can be used in daily life post discharge.   Goal Area(s) Addressed:  Patient will identify priorities, wants and needs. Patient will communicate with LRT and peers. Patient will work collectively as a Administrator, Civil Service. Patient will work on Product manager.    Affect/Mood: N/A   Participation Level: Did not attend    Clinical Observations/Individualized Feedback: Travis [name] was asleep in the dayroom duration of session.   Plan: Continue to engage patient in RT group sessions 2-3x/week.   Rosina Lowenstein, LRT, CTRS 01/23/2023 3:44 PM

## 2023-01-23 NOTE — [facility] Suicide Risk Assessment (Signed)
[facility] Admission Suicide Risk Assessment   Nursing information obtained from:  Patient Demographic factors:  Male, Caucasian, Low socioeconomic status Current Mental Status:  Suicidal ideation indicated by patient Loss Factors:  NA (altercation with family) Historical Factors:  Prior suicide attempts, Impulsivity Risk Reduction Factors:  NA  Total Time spent with patient: 1 hour Principal Problem: Major depressive disorder, recurrent episode, severe (HCC) Diagnosis:  Principal Problem:   Major depressive disorder, recurrent episode, severe (HCC) Active Problems:   Methamphetamine abuse (HCC)  Subjective Data: Travis [name] is a 57 year old white male who was voluntarily admitted to inpatient psychiatry on transfer from [facility].  He endorses anhedonia, difficulty sleeping, depressed mood and suicidal ideation.  He was living with his sister and brother-in-law in caring for them because she has had a stroke and was in rehab and he was visiting her in rehab after he had taken a cannabis gummy and passed out in the bathroom.  He was removed by security and ended up going to [employer] staying for night at [facility].  Upon discharge he went back to [facility] emergency room where he was assessed and sent to [facility].  He admits to using methamphetamine and cannabis along with benzodiazepines.  No alcohol.  He has a history of spinal stenosis and bulging disks.  Also has a history of hypertension.  He does have a primary care doctor in [place].  He informs me that his sister and brother-in-law have kicked him out of the house and he is therefore homeless.   Continued Clinical Symptoms:  Alcohol Use Disorder Identification Test Final Score (AUDIT): 0 The "Alcohol Use Disorders Identification Test", Guidelines for Use in Primary Care, Second Edition.  World Science writer [facility]). Score between 0-7:  no or low risk or alcohol related problems. Score between 8-15:  moderate risk of alcohol related problems. Score  between 16-19:  high risk of alcohol related problems. Score 20 or above:  warrants further diagnostic evaluation for alcohol dependence and treatment.   CLINICAL FACTORS:   Depression:   Anhedonia   Musculoskeletal: Strength & Muscle Tone: within normal limits Gait & Station: normal Patient leans: N/A  Psychiatric Specialty Exam:  Presentation  General Appearance:  Appropriate for Environment  Eye Contact: Good  Speech: Clear and Coherent; Normal Rate  Speech Volume: Normal  Handedness: Right   Mood and Affect  Mood: Anxious; Dysphoric  Affect: Congruent   Thought Process  Thought Processes: Coherent; Goal Directed  Descriptions of Associations:Intact  Orientation:Full (Time, Place and Person)  Thought Content:Logical; WDL  History of Schizophrenia/Schizoaffective disorder:No  Duration of Psychotic Symptoms:Greater than six months  Hallucinations:No data recorded Ideas of Reference:None  Suicidal Thoughts:No data recorded Homicidal Thoughts:No data recorded  Sensorium  Memory: Immediate Fair; Recent Good  Judgment: Intact  Insight: Fair; Present   Executive Functions  Concentration: Good  Attention Span: Good  Recall: Good  Fund of Knowledge: Good  Language: Good   Psychomotor Activity  Psychomotor Activity:No data recorded  Assets  Assets: Communication Skills; Desire for Improvement; Financial Resources/Insurance; Leisure Time; Physical Health   Sleep  Sleep:No data recorded    Blood pressure (!) 155/98, pulse 73, temperature 97.7 F (36.5 C), resp. rate 16, height 6' (1.829 m), weight 117 kg, SpO2 97%. Body mass index is 34.99 kg/m.   COGNITIVE FEATURES THAT CONTRIBUTE TO RISK:  None    SUICIDE RISK:   Mild:  Suicidal ideation of limited frequency, intensity, duration, and specificity.  There are no identifiable plans, no  associated intent, mild dysphoria and related symptoms, good self-control (both  objective and subjective assessment), few other risk factors, and identifiable protective factors, including available and accessible social support.  PLAN OF CARE: See orders  I certify that inpatient services furnished can reasonably be expected to improve the patient's condition.   Sarina Ill, DO 01/23/2023, 12:02 PM

## 2023-01-23 NOTE — Progress Notes (Signed)
Patient is a voluntary admission to [facility] for SAD c/o depression and S/I hx of polysubstance abuse.  Patient continues to endorse depression and SI but contracts for safety. Denies HI, AVH, anxiety. Pt has depressed affect, disheveled look. C/O chronic back pain with degen disc disease and spinal stenosis.Gait is slow but steady. Interacts well with staff and peers and participated in group today.  Will continue to monitor.

## 2023-01-23 NOTE — Progress Notes (Signed)
Admission Note: report received from [facility] RN patient is a  VOL 58 year old male from [place], Pt was calm and cooperative during assessment. Denies SI/HI/AVH. Admission plan of care reviewed with pt, consent signed.  Personal belongings/skin assessment completed.   No contraband found.  Patient oriented to the unit, staff and room.  Routine safety checks initiated.  Verbalizes understanding of unit rules/protocols.   Patient is presently safe on the unit. No unsafe behaviors noted.  Q 15 minute safety checks maintained per unit protocol.

## 2023-01-23 NOTE — Tx Team (Signed)
Initial Treatment Plan 01/23/2023 3:46 AM Irene Limbo WJX:[id number]    PATIENT STRESSORS: Financial difficulties   Marital or family conflict   Substance abuse     PATIENT STRENGTHS: Active sense of humor    PATIENT IDENTIFIED PROBLEMS: Family Disagreement - kicked out   Newly Homeless                   DISCHARGE CRITERIA:  Adequate post-discharge living arrangements Improved stabilization in mood, thinking, and/or behavior  PRELIMINARY DISCHARGE PLAN: Placement in alternative living arrangements  PATIENT/FAMILY INVOLVEMENT: This treatment plan has been presented to and reviewed with the patient, Menzo Braam, and/or family member, .  The patient and family have been given the opportunity to ask questions and make suggestions.  Jarome Matin, RN 01/23/2023, 3:46 AM

## 2023-01-23 NOTE — Group Note (Unsigned)
Date:  01/23/2023 Time:  12:48 AM  Group Topic/Focus:  Building Self Esteem:   The Focus of this group is helping patients become aware of the effects of self-esteem on their lives, the things they and others do that enhance or undermine their self-esteem, seeing the relationship between their level of self-esteem and the choices they make and learning ways to enhance self-esteem.    Participation Level:  Did Not Attend  Participation Quality:      Affect:      Cognitive:      Insight: None  Engagement in Group:      Modes of Intervention:      Additional Comments:    Maeola Harman 01/23/2023, 12:48 AM

## 2023-01-23 NOTE — H&P (Addendum)
Psychiatric Admission Assessment Adult  Patient Identification: Travis [name] MRN:  [id number] Date of Evaluation:  01/23/2023 Chief Complaint:  Schizoaffective psychosis (HCC) [F25.9] Principal Diagnosis: Major depressive disorder, recurrent episode, severe (HCC) Diagnosis:  Principal Problem:   Major depressive disorder, recurrent episode, severe (HCC) Active Problems:   Methamphetamine abuse (HCC)  History of Present Illness: Travis [name] is a 57 year old white male who was voluntarily admitted to inpatient psychiatry on transfer from [facility].  He endorses anhedonia, difficulty sleeping, depressed mood and suicidal ideation.  He was living with his sister and brother-in-law in caring for them because she has had a stroke and was in rehab and he was visiting her in rehab after he had taken a cannabis gummy and passed out in the bathroom.  He was removed by security and ended up going to [employer] staying for night at [facility].  Upon discharge he went back to [facility] emergency room where he was assessed and sent to [facility].  He admits to using methamphetamine and cannabis along with benzodiazepines.  No alcohol.  He has a history of spinal stenosis and bulging disks.  Also has a history of hypertension.  He does have a primary care doctor in [place].  He informs me that his sister and brother-in-law have kicked him out of the house and he is therefore homeless.  Nurse practitioner evaluation: Travis [name] is a 57 y/o male patient with Psychiatry history of anxiety, MDD, PTSD, Suicide attempt, and Polysubstance abuse admitted to continuous assessment unit after presenting from [facility] ED where he initially presented via EMS from the [facility]) with complaints of paranoia, off meds for 3 days, and trouble trusting himself.     Irene Limbo reassessed face-to-face by this provider, chart reviewed, and consulted with Dr. Nelly Rout on 01/21/23 On evaluation, Travis [name] is lying in bed  with no noted distress.  He is alert, oriented x 4, calm, and cooperative.  His speech is clear, coherent, at normal rate, and moderate volume. He maintained good eye contact throughout assessment.  His mood is anxious and dysphoric with congruent affect.  Patient denies suicidal/self-harm/homicidal ideation, psychosis, and paranoia.  He/She reports  Objectively there is no evidence of psychosis/mania or delusional thinking.  His responses to assessment questions were relevant and appropriate.  He conversed coherently, with goal directed thoughts, no distractibility, or pre-occupation.  Patient states that he is currently homeless with no place to stay.  Patient states he was kicked out of his sisters house when she found drugs and that he had been doing drugs.  States this has been an ongoing argument with his sister.  Patient states his las use 3 days ago when kicked out of sisters house.  Patient states he has no where to go .     At this time patient is educated and verbalizes understanding of mental health resources and other crisis services in the community. He is instructed to call 911 and present to the nearest emergency room should he experience any suicidal/homicidal ideation, auditory/visual/hallucinations, or detrimental worsening of his mental health condition.  Patient also instructed that he could call the toll-free phone number on back of insurance card/Medicaid card to get assist with identifying counselors and agencies in network or those with Medicaid can speak with a Medicaid care coordinator.   Associated Signs/Symptoms: Depression Symptoms:  depressed mood, anhedonia, insomnia, suicidal thoughts without plan, (Hypo) Manic Symptoms:  Impulsivity, Anxiety Symptoms:  Excessive Worry, Psychotic Symptoms:  Paranoia, PTSD Symptoms: NA  Total Time spent with patient: 1 hour  Past Psychiatric History: anxiety, MDD, PTSD, Suicide attempt, and Polysubstance abuse   Is the patient at  risk to self? Yes.    Has the patient been a risk to self in the past 6 months? Yes.    Has the patient been a risk to self within the distant past? Yes.    Is the patient a risk to others? No.  Has the patient been a risk to others in the past 6 months? No.  Has the patient been a risk to others within the distant past? No.   [place] Scale:  Flowsheet Row Admission (Current) from 01/23/2023 in [facility] BEHAVIORAL MEDICINE Most recent reading at 01/23/2023  3:00 AM ED from 01/21/2023 in [facility] Emergency Department at [facility] Most recent reading at 01/21/2023 10:49 PM ED from 01/21/2023 in [facility] Emergency Department at [facility] Most recent reading at 01/21/2023  4:52 PM  C-SSRS RISK CATEGORY High Risk High Risk No Risk        Prior Inpatient Therapy: Yes.   If yes, describe for detox and rehab Prior Outpatient Therapy: No. If yes, describe no  Alcohol Screening: 1. How often do you have a drink containing alcohol?: Never 2. How many drinks containing alcohol do you have on a typical day when you are drinking?: 1 or 2 3. How often do you have six or more drinks on one occasion?: Never AUDIT-C Score: 0 4. How often during the last year have you found that you were not able to stop drinking once you had started?: Never 5. How often during the last year have you failed to do what was normally expected from you because of drinking?: Never 6. How often during the last year have you needed a first drink in the morning to get yourself going after a heavy drinking session?: Never 7. How often during the last year have you had a feeling of guilt of remorse after drinking?: Never 8. How often during the last year have you been unable to remember what happened the night before because you had been drinking?: Never 9. Have you or someone else been injured as a result of your drinking?: No 10. Has a relative or friend or a doctor or another health worker been  concerned about your drinking or suggested you cut down?: No Alcohol Use Disorder Identification Test Final Score (AUDIT): 0 Alcohol Brief Interventions/Follow-up: Patient Refused Substance Abuse History in the last 12 months:  Yes.   Consequences of Substance Abuse: Family Consequences:  Homeless Previous Psychotropic Medications: Yes  Psychological Evaluations: Yes  Past Medical History:  Past Medical History:  Diagnosis Date   Anxiety    Panic attacks   Chest pain    Hospital, March, 2014, negative enzymes, patient refused in-hospital  stress test, patient canceled outpatient stress test   Constipation    Degenerative disk disease    Degenerative disk disease    Depression    GERD (gastroesophageal reflux disease)    Hypertension    Incontinence of urine    Neuromuscular disorder (HCC)    Obesity    Polysubstance abuse (HCC)    PTSD (post-traumatic stress disorder)    Pulmonary emboli (HCC)    Seizures (HCC)    Shortness of breath dyspnea    with exertion   Spinal stenosis    Spinal stenosis    Suicide attempt (HCC)    Tachycardia - pulse    Tobacco  abuse     Past Surgical History:  Procedure Laterality Date   COLONOSCOPY N/A 08/24/2014   Procedure: COLONOSCOPY;  Surgeon: Charlott Rakes, MD;  Location: [facility] ENDOSCOPY;  Service: Endoscopy;  Laterality: N/A;   ESOPHAGOGASTRODUODENOSCOPY (EGD) WITH PROPOFOL N/A 08/24/2014   Procedure: ESOPHAGOGASTRODUODENOSCOPY (EGD) WITH PROPOFOL;  Surgeon: Charlott Rakes, MD;  Location: [facility] ENDOSCOPY;  Service: Endoscopy;  Laterality: N/A;   WISDOM TOOTH EXTRACTION     Family History:  Family History  Problem Relation Age of Onset   Stroke Mother    Hypertension Mother    Hyperlipidemia Mother    Stroke Father    Hypertension Father    Dementia Father    Diabetes Father    Heart disease Father    Hyperlipidemia Father    Cancer Sister        brain   Diabetes Sister    Heart disease Sister    Hyperlipidemia Sister     Hypertension Sister    Stroke Sister    Heart disease Brother    Hyperlipidemia Brother    Family Psychiatric  History: Unremarkable Tobacco Screening:  Social History   Tobacco Use  Smoking Status Every Day   Current packs/day: 0.00   Types: Cigarettes   Start date: 12/12/2016   Last attempt to quit: 12/12/2016   Years since quitting: 6.1  Smokeless Tobacco Never    BH Tobacco Counseling     Are you interested in Tobacco Cessation Medications?  Yes, implement Nicotene Replacement Protocol Counseled patient on smoking cessation:  Refused/Declined practical counseling Reason Tobacco Screening Not Completed: No value filed.       Social History:  Social History   Substance and Sexual Activity  Alcohol Use No     Social History   Substance and Sexual Activity  Drug Use Not Currently   Types: Amphetamines, Marijuana, Benzodiazepines    Additional Social History: Marital status: Divorced Divorced, when?: Pt reports he divorced when he was 26 and [age] years old What types of issues is patient dealing with in the relationship?: "She wouldn't quit doing drugs" Additional relationship information: None reported Are you sexually active?: No                         Allergies:   Allergies  Allergen Reactions   Vaccinium Angustifolium Anaphylaxis and Hives   Blueberry Flavor Hives   Penicillins Hives, Itching and Other (See Comments)    Hallucinations. Has patient had a PCN reaction causing immediate rash, facial/tongue/throat swelling, SOB or lightheadedness with hypotension:YES Has patient had a PCN reaction causing severe rash involving mucus membranes or skin necrosis: NO Has patient had a PCN reaction that required hospitalization: yes Has patient had a PCN reaction occurring within the last 10 years: NO If all of the above answers are "NO", then may proceed with Cephalosporin use. * Zosyn x 1 03/24/21. No rxn noted.   Robaxin [Methocarbamol] Palpitations    Toradol [Ketorolac Tromethamine] Hives and Other (See Comments)    Headache, pt states that he is not allergic   Lab Results:  Results for orders placed or performed during the hospital encounter of 01/21/23 (from the past 48 hour(s))  SARS Coronavirus 2 by RT PCR (hospital order, performed in [facility] hospital lab) *cepheid single result test* Anterior Nasal Swab     Status: None   Collection Time: 01/22/23  8:38 PM   Specimen: Anterior Nasal Swab  Result Value Ref Range   SARS Coronavirus  2 by RT PCR NEGATIVE NEGATIVE    Comment: (NOTE) SARS-CoV-2 target nucleic acids are NOT DETECTED.  The SARS-CoV-2 RNA is generally detectable in upper and lower respiratory specimens during the acute phase of infection. The lowest concentration of SARS-CoV-2 viral copies this assay can detect is 250 copies / mL. A negative result does not preclude SARS-CoV-2 infection and should not be used as the sole basis for treatment or other patient management decisions.  A negative result may occur with improper specimen collection / handling, submission of specimen other than nasopharyngeal swab, presence of viral mutation(s) within the areas targeted by this assay, and inadequate number of viral copies (<250 copies / mL). A negative result must be combined with clinical observations, patient history, and epidemiological information.  Fact Sheet for Patients:   RoadLapTop.co.za  Fact Sheet for Healthcare Providers: http://kim-miller.com/  This test is not yet approved or  cleared by the [place] FDA and has been authorized for detection and/or diagnosis of SARS-CoV-2 by FDA under an Emergency Use Authorization (EUA).  This EUA will remain in effect (meaning this test can be used) for the duration of the COVID-19 declaration under Section 564(b)(1) of the Act, 21 U.S.C. section 360bbb-3(b)(1), unless the authorization is terminated or revoked  sooner.  Performed at [facility], [address] [place], [name] [address]     Blood Alcohol level:  Lab Results  Component Value Date   Inov8 Surgical <10 01/20/2023   ETH <10 02/28/2018    Metabolic Disorder Labs:  Lab Results  Component Value Date   HGBA1C 5.9 10/12/2013   MPG 123 (H) 05/29/2012   No results found for: "PROLACTIN" Lab Results  Component Value Date   CHOL 115 05/30/2012   TRIG 87 05/30/2012   HDL 25 (L) 05/30/2012   CHOLHDL 4.6 05/30/2012   VLDL 17 05/30/2012   LDLCALC 73 05/30/2012   LDLCALC 69 11/15/2008    Current Medications: Current Facility-Administered Medications  Medication Dose Route Frequency Provider Last Rate Last Admin   acetaminophen (TYLENOL) tablet 1,000 mg  1,000 mg Oral Q6H PRN Maryagnes Amos, FNP       acetaminophen (TYLENOL) tablet 650 mg  650 mg Oral Q6H PRN Starkes-Perry, Juel Burrow, FNP       alum & mag hydroxide-simeth (MAALOX/MYLANTA) 200-200-20 MG/5ML suspension 30 mL  30 mL Oral Q4H PRN Starkes-Perry, Juel Burrow, FNP       cyclobenzaprine (FLEXERIL) tablet 10 mg  10 mg Oral BID Maryagnes Amos, FNP   10 mg at 01/23/23 0929   famotidine (PEPCID) tablet 20 mg  20 mg Oral Daily PRN Maryagnes Amos, FNP       gabapentin (NEURONTIN) capsule 1,200 mg  1,200 mg Oral TID Maryagnes Amos, FNP   1,200 mg at 01/23/23 0929   lisinopril (ZESTRIL) tablet 20 mg  20 mg Oral Daily Maryagnes Amos, FNP   20 mg at 01/23/23 0932   magnesium hydroxide (MILK OF MAGNESIA) suspension 30 mL  30 mL Oral Daily PRN Maryagnes Amos, FNP       metFORMIN (GLUCOPHAGE-XR) 24 hr tablet 500 mg  500 mg Oral Q breakfast Starkes-Perry, Takia S, FNP       nicotine (NICODERM CQ - dosed in mg/24 hours) patch 21 mg  21 mg Transdermal Daily Sarina Ill, DO   21 mg at 01/23/23 1106   pantoprazole (PROTONIX) EC tablet 40 mg  40 mg Oral Daily Maryagnes Amos, FNP   40 mg  at 01/23/23 0929   PTA Medications: Medications  Prior to Admission  Medication Sig Dispense Refill Last Dose   acetaminophen (TYLENOL) 500 MG tablet Take 1,000 mg by mouth every 6 (six) hours as needed for mild pain or moderate pain.      CLARITIN 10 MG tablet Take 10 mg by mouth daily as needed for allergies.      cyclobenzaprine (FLEXERIL) 10 MG tablet Take 10 mg by mouth 2 (two) times daily.      famotidine (PEPCID) 20 MG tablet Take 20 mg by mouth daily as needed for heartburn or indigestion. (Patient not taking: Reported on 01/20/2023)      gabapentin (NEURONTIN) 600 MG tablet Take 1,200 mg by mouth 3 (three) times daily.      lisinopril (ZESTRIL) 20 MG tablet Take 20 mg by mouth daily.      metFORMIN (GLUCOPHAGE-XR) 500 MG 24 hr tablet Take 500 mg by mouth daily with breakfast. (Patient not taking: Reported on 01/20/2023)      metoprolol tartrate (LOPRESSOR) 100 MG tablet Take 100 mg by mouth 2 (two) times daily.      pantoprazole (PROTONIX) 40 MG tablet Take 1 tablet (40 mg total) by mouth daily. (Patient not taking: Reported on 01/20/2023) 30 tablet 0    QUEtiapine (SEROQUEL) 200 MG tablet Take 300 mg by mouth at bedtime.      sertraline (ZOLOFT) 25 MG tablet Take 1 tablet (25 mg total) by mouth daily. 14 tablet 0     Musculoskeletal: Strength & Muscle Tone: within normal limits Gait & Station: normal Patient leans: N/A            Psychiatric Specialty Exam:  Presentation  General Appearance:  Appropriate for Environment  Eye Contact: Good  Speech: Clear and Coherent; Normal Rate  Speech Volume: Normal  Handedness: Right   Mood and Affect  Mood: Anxious; Dysphoric  Affect: Congruent   Thought Process  Thought Processes: Coherent; Goal Directed  Duration of Psychotic Symptoms:N/A Past Diagnosis of Schizophrenia or Psychoactive disorder: No  Descriptions of Associations:Intact  Orientation:Full (Time, Place and Person)  Thought Content:Logical; WDL  Hallucinations:No data recorded Ideas  of Reference:None  Suicidal Thoughts:No data recorded Homicidal Thoughts:No data recorded  Sensorium  Memory: Immediate Fair; Recent Good  Judgment: Intact  Insight: Fair; Present   Executive Functions  Concentration: Good  Attention Span: Good  Recall: Good  Fund of Knowledge: Good  Language: Good   Psychomotor Activity  Psychomotor Activity:No data recorded  Assets  Assets: Communication Skills; Desire for Improvement; Financial Resources/Insurance; Leisure Time; Physical Health   Sleep  Sleep:No data recorded   Physical Exam: Physical Exam Vitals and nursing note reviewed.  Constitutional:      Appearance: Normal appearance. He is normal weight.  HENT:     Head: Normocephalic and atraumatic.     Nose: Nose normal.     Mouth/Throat:     Pharynx: Oropharynx is clear.  Eyes:     Extraocular Movements: Extraocular movements intact.     Pupils: Pupils are equal, round, and reactive to light.  Cardiovascular:     Rate and Rhythm: Normal rate and regular rhythm.     Pulses: Normal pulses.     Heart sounds: Normal heart sounds.  Pulmonary:     Effort: Pulmonary effort is normal.     Breath sounds: Normal breath sounds.  Abdominal:     General: Abdomen is flat. Bowel sounds are normal.     Palpations: Abdomen is soft.  Musculoskeletal:        General: Normal range of motion.     Cervical back: Normal range of motion and neck supple.  Skin:    General: Skin is warm and dry.  Neurological:     General: No focal deficit present.     Mental Status: He is alert and oriented to person, place, and time.  Psychiatric:        Attention and Perception: Attention and perception normal.        Mood and Affect: Mood is depressed. Affect is flat.        Speech: Speech normal.        Behavior: Behavior normal. Behavior is cooperative.        Thought Content: Thought content normal.        Cognition and Memory: Cognition and memory normal.        Judgment:  Judgment normal.    Review of Systems  Constitutional: Negative.   HENT: Negative.    Eyes: Negative.   Respiratory: Negative.    Cardiovascular: Negative.   Gastrointestinal: Negative.   Genitourinary: Negative.   Musculoskeletal: Negative.   Skin: Negative.   Neurological: Negative.   Endo/Heme/Allergies: Negative.   Psychiatric/Behavioral: Negative.     Blood pressure (!) 155/98, pulse 73, temperature 97.7 F (36.5 C), resp. rate 16, height 6' (1.829 m), weight 117 kg, SpO2 97%. Body mass index is 34.99 kg/m.  Treatment Plan Summary: Daily contact with patient to assess and evaluate symptoms and progress in treatment, Medication management, and Plan see orders  Observation Level/Precautions:  15 minute checks  Laboratory:  CBC Chemistry Profile  Psychotherapy:    Medications:    Consultations:    Discharge Concerns:    Estimated LOS:  Other:     Physician Treatment Plan for Primary Diagnosis: Major depressive disorder, recurrent episode, severe (HCC) Long Term Goal(s): Improvement in symptoms so as ready for discharge  Short Term Goals: Ability to identify changes in lifestyle to reduce recurrence of condition will improve, Ability to verbalize feelings will improve, Ability to disclose and discuss suicidal ideas, Ability to demonstrate self-control will improve, Ability to identify and develop effective coping behaviors will improve, Ability to maintain clinical measurements within normal limits will improve, Compliance with prescribed medications will improve, and Ability to identify triggers associated with substance abuse/mental health issues will improve  Physician Treatment Plan for Secondary Diagnosis: Principal Problem:   Major depressive disorder, recurrent episode, severe (HCC) Active Problems:   Methamphetamine abuse (HCC)   I certify that inpatient services furnished can reasonably be expected to improve the patient's condition.    Sarina Ill,  DO 11/21/202412:01 PM

## 2023-01-23 NOTE — Group Note (Unsigned)
Date:  01/23/2023 Time:  8:36 PM  Group Topic/Focus:  Making Healthy Choices:   The focus of this group is to help patients identify negative/unhealthy choices they were using prior to admission and identify positive/healthier coping strategies to replace them upon discharge.     Participation Level:  {BHH PARTICIPATION ZOXWR:60454}  Participation Quality:  {BHH PARTICIPATION QUALITY:22265}  Affect:  {BHH AFFECT:22266}  Cognitive:  {BHH COGNITIVE:22267}  Insight: {BHH Insight2:20797}  Engagement in Group:  {BHH ENGAGEMENT IN UJWJX:91478}  Modes of Intervention:  {BHH MODES OF INTERVENTION:22269}  Additional Comments:  ***  Burt Ek 01/23/2023, 8:36 PM

## 2023-01-23 NOTE — Group Note (Signed)
Date:  01/23/2023 Time:  11:56 AM  Group Topic/Focus:  Building Self Esteem:   The Focus of this group is helping patients become aware of the effects of self-esteem on their lives, the things they and others do that enhance or undermine their self-esteem, seeing the relationship between their level of self-esteem and the choices they make and learning ways to enhance self-esteem. Coping With Mental Health Crisis:   The purpose of this group is to help patients identify strategies for coping with mental health crisis.  Group discusses possible causes of crisis and ways to manage them effectively. Managing Feelings:   The focus of this group is to identify what feelings patients have difficulty handling and develop a plan to handle them in a healthier way upon discharge.    Participation Level:  Active  Participation Quality:  Appropriate, Attentive, Sharing, and Supportive  Affect:  Appropriate  Cognitive:  Alert, Appropriate, and Oriented  Insight: Appropriate and Good  Engagement in Group:  Engaged and Supportive  Modes of Intervention:  Discussion  Additional Comments:     Alexis Frock 01/23/2023, 11:56 AM

## 2023-01-23 NOTE — Group Note (Signed)
Date:  01/23/2023 Time:  8:50 PM  Group Topic/Focus:  Making Healthy Choices:   The focus of this group is to help patients identify negative/unhealthy choices they were using prior to admission and identify positive/healthier coping strategies to replace them upon discharge.    Participation Level:  Active  Participation Quality:  Appropriate  Affect:  Appropriate  Cognitive:  Appropriate  Insight: Appropriate  Engagement in Group:  Engaged  Modes of Intervention:  Discussion  Additional Comments:    Burt Ek 01/23/2023, 8:50 PM

## 2023-01-23 NOTE — ED Notes (Signed)
Pt and his 3 bags of belongings wheeled out to the safe transport vehicle without incident. Receiving RN notified of the patients departure from our facility.

## 2023-01-24 DIAGNOSIS — F332 Major depressive disorder, recurrent severe without psychotic features: Secondary | ICD-10-CM | POA: Diagnosis not present

## 2023-01-24 MED ORDER — QUETIAPINE FUMARATE 200 MG PO TABS
300.0000 mg | ORAL_TABLET | Freq: Every day | ORAL | Status: DC
  Administered 2023-01-24 – 2023-02-10 (×18): 300 mg via ORAL
  Filled 2023-01-24 (×18): qty 1

## 2023-01-24 MED ORDER — AMITRIPTYLINE HCL 10 MG PO TABS
25.0000 mg | ORAL_TABLET | Freq: Every day | ORAL | Status: DC
  Administered 2023-01-24: 25 mg via ORAL
  Filled 2023-01-24: qty 3

## 2023-01-24 MED ORDER — CYCLOBENZAPRINE HCL 10 MG PO TABS
10.0000 mg | ORAL_TABLET | Freq: Three times a day (TID) | ORAL | Status: DC | PRN
  Administered 2023-01-26 – 2023-02-11 (×23): 10 mg via ORAL
  Filled 2023-01-24 (×27): qty 1

## 2023-01-24 NOTE — BH IP Treatment Plan (Signed)
Interdisciplinary Treatment and Diagnostic Plan Update  01/24/2023 Time of Session: 9:55 AM  Travis [name] MRN: [id number]  Principal Diagnosis: Major depressive disorder, recurrent episode, severe (HCC)  Secondary Diagnoses: Principal Problem:   Major depressive disorder, recurrent episode, severe (HCC) Active Problems:   Methamphetamine abuse (HCC)   Current Medications:  Current Facility-Administered Medications  Medication Dose Route Frequency Provider Last Rate Last Admin   acetaminophen (TYLENOL) tablet 650 mg  650 mg Oral Q6H PRN Maryagnes Amos, FNP   650 mg at 01/24/23 0804   alum & mag hydroxide-simeth (MAALOX/MYLANTA) 200-200-20 MG/5ML suspension 30 mL  30 mL Oral Q4H PRN Maryagnes Amos, FNP       cloNIDine (CATAPRES) tablet 0.2 mg  0.2 mg Oral Q8H PRN Sarina Ill, DO       cyclobenzaprine (FLEXERIL) tablet 10 mg  10 mg Oral BID Maryagnes Amos, FNP   10 mg at 01/24/23 0804   escitalopram (LEXAPRO) tablet 10 mg  10 mg Oral QPC breakfast Sarina Ill, DO   10 mg at 01/24/23 0804   famotidine (PEPCID) tablet 20 mg  20 mg Oral Daily PRN Maryagnes Amos, FNP   20 mg at 01/24/23 0804   gabapentin (NEURONTIN) capsule 1,200 mg  1,200 mg Oral TID Maryagnes Amos, FNP   1,200 mg at 01/24/23 0804   lisinopril (ZESTRIL) tablet 20 mg  20 mg Oral Daily Maryagnes Amos, FNP   20 mg at 01/24/23 0804   LORazepam (ATIVAN) tablet 1 mg  1 mg Oral Q4H PRN Sarina Ill, DO       magnesium hydroxide (MILK OF MAGNESIA) suspension 30 mL  30 mL Oral Daily PRN Starkes-Perry, Juel Burrow, FNP       metFORMIN (GLUCOPHAGE-XR) 24 hr tablet 500 mg  500 mg Oral Q breakfast Maryagnes Amos, FNP   500 mg at 01/24/23 0804   metoprolol tartrate (LOPRESSOR) tablet 100 mg  100 mg Oral QPC breakfast Sarina Ill, DO   100 mg at 01/24/23 0804   nicotine (NICODERM CQ - dosed in mg/24 hours) patch 21 mg  21 mg Transdermal Daily  Sarina Ill, DO   21 mg at 01/24/23 0825   OLANZapine (ZYPREXA) tablet 10 mg  10 mg Oral Q6H PRN Sarina Ill, DO       pantoprazole (PROTONIX) EC tablet 40 mg  40 mg Oral Daily Maryagnes Amos, FNP   40 mg at 01/24/23 0804   QUEtiapine (SEROQUEL) tablet 200 mg  200 mg Oral QHS Sarina Ill, DO   200 mg at 01/23/23 2126   PTA Medications: Medications Prior to Admission  Medication Sig Dispense Refill Last Dose   acetaminophen (TYLENOL) 500 MG tablet Take 1,000 mg by mouth every 6 (six) hours as needed for mild pain or moderate pain.      CLARITIN 10 MG tablet Take 10 mg by mouth daily as needed for allergies.      cyclobenzaprine (FLEXERIL) 10 MG tablet Take 10 mg by mouth 2 (two) times daily.      famotidine (PEPCID) 20 MG tablet Take 20 mg by mouth daily as needed for heartburn or indigestion. (Patient not taking: Reported on 01/20/2023)      gabapentin (NEURONTIN) 600 MG tablet Take 1,200 mg by mouth 3 (three) times daily.      lisinopril (ZESTRIL) 20 MG tablet Take 20 mg by mouth daily.      metFORMIN (GLUCOPHAGE-XR) 500 MG 24 hr  tablet Take 500 mg by mouth daily with breakfast. (Patient not taking: Reported on 01/20/2023)      metoprolol tartrate (LOPRESSOR) 100 MG tablet Take 100 mg by mouth 2 (two) times daily.      pantoprazole (PROTONIX) 40 MG tablet Take 1 tablet (40 mg total) by mouth daily. (Patient not taking: Reported on 01/20/2023) 30 tablet 0    QUEtiapine (SEROQUEL) 200 MG tablet Take 300 mg by mouth at bedtime.      sertraline (ZOLOFT) 25 MG tablet Take 1 tablet (25 mg total) by mouth daily. 14 tablet 0     Patient Stressors: Financial difficulties   Marital or family conflict   Substance abuse    Patient Strengths: Active sense of humor   Treatment Modalities: Medication Management, Group therapy, Case management,  1 to 1 session with clinician, Psychoeducation, Recreational therapy.   Physician Treatment Plan for Primary  Diagnosis: Major depressive disorder, recurrent episode, severe (HCC) Long Term Goal(s): Improvement in symptoms so as ready for discharge   Short Term Goals: Ability to identify changes in lifestyle to reduce recurrence of condition will improve Ability to verbalize feelings will improve Ability to disclose and discuss suicidal ideas Ability to demonstrate self-control will improve Ability to identify and develop effective coping behaviors will improve Ability to maintain clinical measurements within normal limits will improve Compliance with prescribed medications will improve Ability to identify triggers associated with substance abuse/mental health issues will improve  Medication Management: Evaluate patient's response, side effects, and tolerance of medication regimen.  Therapeutic Interventions: 1 to 1 sessions, Unit Group sessions and Medication administration.  Evaluation of Outcomes: Progressing  Physician Treatment Plan for Secondary Diagnosis: Principal Problem:   Major depressive disorder, recurrent episode, severe (HCC) Active Problems:   Methamphetamine abuse (HCC)  Long Term Goal(s): Improvement in symptoms so as ready for discharge   Short Term Goals: Ability to identify changes in lifestyle to reduce recurrence of condition will improve Ability to verbalize feelings will improve Ability to disclose and discuss suicidal ideas Ability to demonstrate self-control will improve Ability to identify and develop effective coping behaviors will improve Ability to maintain clinical measurements within normal limits will improve Compliance with prescribed medications will improve Ability to identify triggers associated with substance abuse/mental health issues will improve     Medication Management: Evaluate patient's response, side effects, and tolerance of medication regimen.  Therapeutic Interventions: 1 to 1 sessions, Unit Group sessions and Medication  administration.  Evaluation of Outcomes: Progressing   RN Treatment Plan for Primary Diagnosis: Major depressive disorder, recurrent episode, severe (HCC) Long Term Goal(s): Knowledge of disease and therapeutic regimen to maintain health will improve  Short Term Goals: Ability to remain free from injury will improve, Ability to verbalize frustration and anger appropriately will improve, Ability to demonstrate self-control, Ability to participate in decision making will improve, Ability to verbalize feelings will improve, Ability to disclose and discuss suicidal ideas, Ability to identify and develop effective coping behaviors will improve, and Compliance with prescribed medications will improve  Medication Management: RN will administer medications as ordered by provider, will assess and evaluate patient's response and provide education to patient for prescribed medication. RN will report any adverse and/or side effects to prescribing provider.  Therapeutic Interventions: 1 on 1 counseling sessions, Psychoeducation, Medication administration, Evaluate responses to treatment, Monitor vital signs and CBGs as ordered, Perform/monitor CIWA, COWS, AIMS and Fall Risk screenings as ordered, Perform wound care treatments as ordered.  Evaluation of Outcomes: Progressing  LCSW Treatment Plan for Primary Diagnosis: Major depressive disorder, recurrent episode, severe (HCC) Long Term Goal(s): Safe transition to appropriate next level of care at discharge, Engage patient in therapeutic group addressing interpersonal concerns.  Short Term Goals: Engage patient in aftercare planning with referrals and resources, Increase social support, Increase ability to appropriately verbalize feelings, Increase emotional regulation, Facilitate acceptance of mental health diagnosis and concerns, Facilitate patient progression through stages of change regarding substance use diagnoses and concerns, Identify triggers  associated with mental health/substance abuse issues, and Increase skills for wellness and recovery  Therapeutic Interventions: Assess for all discharge needs, 1 to 1 time with Social worker, Explore available resources and support systems, Assess for adequacy in community support network, Educate family and significant other(s) on suicide prevention, Complete Psychosocial Assessment, Interpersonal group therapy.  Evaluation of Outcomes: Progressing   Progress in Treatment: Attending groups: Yes. and No. Participating in groups: Yes. and No. Taking medication as prescribed: Yes. Toleration medication: Yes. Family/Significant other contact made: No, will contact:  CSW will contact if given permission  Patient understands diagnosis: Yes. Discussing patient identified problems/goals with staff: Yes. Medical problems stabilized or resolved: Yes. Denies suicidal/homicidal ideation: Yes. Issues/concerns per patient self-inventory: No. Other: None   New problem(s) identified: No, Describe:  None identified  New Short Term/Long Term Goal(s):  elimination of symptoms of psychosis, medication management for mood stabilization; elimination of SI thoughts; development of comprehensive mental wellness plan.   Patient Goals:  : I want to feel like I'm not going to run out and go into traffic"  Discharge Plan or Barriers: CSW will assist with appropriate discharge planning   Reason for Continuation of Hospitalization: Depression Medication stabilization Suicidal ideation  Estimated Length of Stay: 1 to 7 days   Last 3 [place] Suicide Severity Risk Score: Flowsheet Row Admission (Current) from 01/23/2023 in [facility] Most recent reading at 01/23/2023  3:00 AM ED from 01/21/2023 in [facility] Emergency Department at [facility] Most recent reading at 01/21/2023 10:49 PM ED from 01/21/2023 in [facility] Emergency Department at [facility] Most recent  reading at 01/21/2023  4:52 PM  C-SSRS RISK CATEGORY High Risk High Risk No Risk       Last PHQ 2/9 Scores:    01/22/2023   12:34 AM 08/26/2014    4:11 PM 04/07/2014   11:30 AM  Depression screen PHQ 2/9  Decreased Interest 2 2 1   Down, Depressed, Hopeless 2 2 3   PHQ - 2 Score 4 4 4   Altered sleeping 3 3 3   Tired, decreased energy 3 3 2   Change in appetite 2 2 2   Feeling bad or failure about yourself  3 3 0  Trouble concentrating 3 3 1   Moving slowly or fidgety/restless 0 0 0  Suicidal thoughts 1 1 0  PHQ-9 Score 19 19 12   Difficult doing work/chores Very difficult      Scribe for Treatment Team: Elza Rafter, LCSWA 01/24/2023 11:08 AM

## 2023-01-24 NOTE — Plan of Care (Signed)
Patient alert and oriented. Flat affect. Reports feeling agitated because of nicotine. Endorses anxiety level 10/10. Po medications given as scheduled.  Support and encouragement provided. No behavior issues noted. Q 15 min checks maintained for safety. Denies SI, HI, AVH, and pain. Patient remains safe at this time.  Problem: Education: Goal: Utilization of techniques to improve thought processes will improve Outcome: Progressing Goal: Knowledge of the prescribed therapeutic regimen will improve Outcome: Progressing   Problem: Activity: Goal: Interest or engagement in leisure activities will improve Outcome: Progressing Goal: Imbalance in normal sleep/wake cycle will improve Outcome: Progressing   Problem: Coping: Goal: Coping ability will improve Outcome: Progressing Goal: Will verbalize feelings Outcome: Progressing   Problem: Safety: Goal: Ability to disclose and discuss suicidal ideas will improve Outcome: Progressing Goal: Ability to identify and utilize support systems that promote safety will improve Outcome: Progressing

## 2023-01-24 NOTE — Progress Notes (Signed)
[facility] MD Progress Note  01/24/2023 12:13 PM Travis [name]  MRN:  [id number] Subjective: Travis [name] is seen on rounds.  His bed was unable to elevate at the head because his broken and he sleeps upright so he did not sleep very well.  So now, he feels like he is in a lot of pain.  Talked about Elavil and he would like to try it.  He says that he is on 300 mg of Seroquel at bedtime.  Other than that nurses report no issues and he has no other complaints.  He does talk about walking into traffic but is able to contract for safety in the hospital.  He is interested in drug and alcohol rehab and social work informed him that maybe his sister will take him back after that.  Principal Problem: Major depressive disorder, recurrent episode, severe (HCC) Diagnosis: Principal Problem:   Major depressive disorder, recurrent episode, severe (HCC) Active Problems:   Methamphetamine abuse (HCC)  Total Time spent with patient: 15 minutes  Past Psychiatric History:  anxiety, MDD, PTSD, Suicide attempt, and Polysubstance abuse   Past Medical History:  Past Medical History:  Diagnosis Date   Anxiety    Panic attacks   Chest pain    Hospital, March, 2014, negative enzymes, patient refused in-hospital  stress test, patient canceled outpatient stress test   Constipation    Degenerative disk disease    Degenerative disk disease    Depression    GERD (gastroesophageal reflux disease)    Hypertension    Incontinence of urine    Neuromuscular disorder (HCC)    Obesity    Polysubstance abuse (HCC)    PTSD (post-traumatic stress disorder)    Pulmonary emboli (HCC)    Seizures (HCC)    Shortness of breath dyspnea    with exertion   Spinal stenosis    Spinal stenosis    Suicide attempt (HCC)    Tachycardia - pulse    Tobacco abuse     Past Surgical History:  Procedure Laterality Date   COLONOSCOPY N/A 08/24/2014   Procedure: COLONOSCOPY;  Surgeon: Charlott Rakes, MD;  Location: [facility] ENDOSCOPY;  Service:  Endoscopy;  Laterality: N/A;   ESOPHAGOGASTRODUODENOSCOPY (EGD) WITH PROPOFOL N/A 08/24/2014   Procedure: ESOPHAGOGASTRODUODENOSCOPY (EGD) WITH PROPOFOL;  Surgeon: Charlott Rakes, MD;  Location: [facility] ENDOSCOPY;  Service: Endoscopy;  Laterality: N/A;   WISDOM TOOTH EXTRACTION     Family History:  Family History  Problem Relation Age of Onset   Stroke Mother    Hypertension Mother    Hyperlipidemia Mother    Stroke Father    Hypertension Father    Dementia Father    Diabetes Father    Heart disease Father    Hyperlipidemia Father    Cancer Sister        brain   Diabetes Sister    Heart disease Sister    Hyperlipidemia Sister    Hypertension Sister    Stroke Sister    Heart disease Brother    Hyperlipidemia Brother    Family Psychiatric  History: Unremarkable Social History:  Social History   Substance and Sexual Activity  Alcohol Use No     Social History   Substance and Sexual Activity  Drug Use Not Currently   Types: Amphetamines, Marijuana, Benzodiazepines    Social History   Socioeconomic History   Marital status: Divorced    Spouse name: Not on file   Number of children: Not on file   Years of education: Not  on file   Highest education level: Not on file  Occupational History   Not on file  Tobacco Use   Smoking status: Every Day    Current packs/day: 0.00    Types: Cigarettes    Start date: 12/12/2016    Last attempt to quit: 12/12/2016    Years since quitting: 6.1   Smokeless tobacco: Never  Vaping Use   Vaping status: Every Day   Substances: Nicotine  Substance and Sexual Activity   Alcohol use: No   Drug use: Not Currently    Types: Amphetamines, Marijuana, Benzodiazepines   Sexual activity: Never  Other Topics Concern   Not on file  Social History Narrative   Not on file   Social Determinants of Health   Financial Resource Strain: High Risk (01/09/2023)   Received from [facility]   Overall Financial Resource Strain (CARDIA)     Difficulty of Paying Living Expenses: Hard  Food Insecurity: Food Insecurity Present (01/23/2023)   Hunger Vital Sign    Worried About Running Out of Food in the Last Year: Sometimes true    Ran Out of Food in the Last Year: Sometimes true  Transportation Needs: Unmet Transportation Needs (01/23/2023)   PRAPARE - Administrator, Civil Service (Medical): Yes    Lack of Transportation (Non-Medical): Yes  Physical Activity: Inactive (01/09/2023)   Received from [facility]   Exercise Vital Sign    Days of Exercise per Week: 0 days    Minutes of Exercise per Session: 10 min  Stress: Stress Concern Present (01/09/2023)   Received from [facility] - Occupational Stress Questionnaire    Feeling of Stress : Rather much  Social Connections: Moderately Integrated (01/09/2023)   Received from [facility]   Social Network    How would you rate your social network (family, work, friends)?: Adequate participation with social networks   Additional Social History:                         Sleep: Poor  Appetite:  Fair  Current Medications: Current Facility-Administered Medications  Medication Dose Route Frequency Provider Last Rate Last Admin   acetaminophen (TYLENOL) tablet 650 mg  650 mg Oral Q6H PRN Maryagnes Amos, FNP   650 mg at 01/24/23 0804   alum & mag hydroxide-simeth (MAALOX/MYLANTA) 200-200-20 MG/5ML suspension 30 mL  30 mL Oral Q4H PRN Maryagnes Amos, FNP       amitriptyline (ELAVIL) tablet 25 mg  25 mg Oral QHS Ripley Lovecchio Ramon Dredge, DO       cloNIDine (CATAPRES) tablet 0.2 mg  0.2 mg Oral Q8H PRN Sarina Ill, DO       cyclobenzaprine (FLEXERIL) tablet 10 mg  10 mg Oral TID PRN Sarina Ill, DO       escitalopram (LEXAPRO) tablet 10 mg  10 mg Oral QPC breakfast Sarina Ill, DO   10 mg at 01/24/23 0804   famotidine (PEPCID) tablet 20 mg  20 mg Oral Daily PRN  Maryagnes Amos, FNP   20 mg at 01/24/23 0804   gabapentin (NEURONTIN) capsule 1,200 mg  1,200 mg Oral TID Maryagnes Amos, FNP   1,200 mg at 01/24/23 0804   lisinopril (ZESTRIL) tablet 20 mg  20 mg Oral Daily Maryagnes Amos, FNP   20 mg at 01/24/23 0804   LORazepam (ATIVAN) tablet 1 mg  1 mg Oral Q4H  PRN Sarina Ill, DO       magnesium hydroxide (MILK OF MAGNESIA) suspension 30 mL  30 mL Oral Daily PRN Rosario Adie, Juel Burrow, FNP       metFORMIN (GLUCOPHAGE-XR) 24 hr tablet 500 mg  500 mg Oral Q breakfast Maryagnes Amos, FNP   500 mg at 01/24/23 0804   metoprolol tartrate (LOPRESSOR) tablet 100 mg  100 mg Oral QPC breakfast Sarina Ill, DO   100 mg at 01/24/23 1610   nicotine (NICODERM CQ - dosed in mg/24 hours) patch 21 mg  21 mg Transdermal Daily Sarina Ill, DO   21 mg at 01/24/23 0825   OLANZapine (ZYPREXA) tablet 10 mg  10 mg Oral Q6H PRN Sarina Ill, DO       pantoprazole (PROTONIX) EC tablet 40 mg  40 mg Oral Daily Maryagnes Amos, FNP   40 mg at 01/24/23 0804   QUEtiapine (SEROQUEL) tablet 300 mg  300 mg Oral QHS Sarina Ill, DO        Lab Results:  Results for orders placed or performed during the hospital encounter of 01/21/23 (from the past 48 hour(s))  SARS Coronavirus 2 by RT PCR (hospital order, performed in [facility] hospital lab) *cepheid single result test* Anterior Nasal Swab     Status: None   Collection Time: 01/22/23  8:38 PM   Specimen: Anterior Nasal Swab  Result Value Ref Range   SARS Coronavirus 2 by RT PCR NEGATIVE NEGATIVE    Comment: (NOTE) SARS-CoV-2 target nucleic acids are NOT DETECTED.  The SARS-CoV-2 RNA is generally detectable in upper and lower respiratory specimens during the acute phase of infection. The lowest concentration of SARS-CoV-2 viral copies this assay can detect is 250 copies / mL. A negative result does not preclude SARS-CoV-2 infection and  should not be used as the sole basis for treatment or other patient management decisions.  A negative result may occur with improper specimen collection / handling, submission of specimen other than nasopharyngeal swab, presence of viral mutation(s) within the areas targeted by this assay, and inadequate number of viral copies (<250 copies / mL). A negative result must be combined with clinical observations, patient history, and epidemiological information.  Fact Sheet for Patients:   RoadLapTop.co.za  Fact Sheet for Healthcare Providers: http://kim-miller.com/  This test is not yet approved or  cleared by the [place] FDA and has been authorized for detection and/or diagnosis of SARS-CoV-2 by FDA under an Emergency Use Authorization (EUA).  This EUA will remain in effect (meaning this test can be used) for the duration of the COVID-19 declaration under Section 564(b)(1) of the Act, 21 U.S.C. section 360bbb-3(b)(1), unless the authorization is terminated or revoked sooner.  Performed at [facility], [address] [place], [name] [address]     Blood Alcohol level:  Lab Results  Component Value Date   [facility] <10 01/20/2023   ETH <10 02/28/2018    Metabolic Disorder Labs: Lab Results  Component Value Date   HGBA1C 5.9 10/12/2013   MPG 123 (H) 05/29/2012   No results found for: "PROLACTIN" Lab Results  Component Value Date   CHOL 115 05/30/2012   TRIG 87 05/30/2012   HDL 25 (L) 05/30/2012   CHOLHDL 4.6 05/30/2012   VLDL 17 05/30/2012   LDLCALC 73 05/30/2012   LDLCALC 69 11/15/2008    Physical Findings: AIMS:  , ,  ,  ,    CIWA:    COWS:  Musculoskeletal: Strength & Muscle Tone: within normal limits Gait & Station: normal Patient leans: N/A  Psychiatric Specialty Exam:  Presentation  General Appearance:  Appropriate for Environment  Eye Contact: Good  Speech: Clear and Coherent; Normal Rate  Speech  Volume: Normal  Handedness: Right   Mood and Affect  Mood: Anxious; Dysphoric  Affect: Congruent   Thought Process  Thought Processes: Coherent; Goal Directed  Descriptions of Associations:Intact  Orientation:Full (Time, Place and Person)  Thought Content:Logical; WDL  History of Schizophrenia/Schizoaffective disorder:No  Duration of Psychotic Symptoms:Greater than six months  Hallucinations:No data recorded Ideas of Reference:None  Suicidal Thoughts:No data recorded Homicidal Thoughts:No data recorded  Sensorium  Memory: Immediate Fair; Recent Good  Judgment: Intact  Insight: Fair; Present   Executive Functions  Concentration: Good  Attention Span: Good  Recall: Good  Fund of Knowledge: Good  Language: Good   Psychomotor Activity  Psychomotor Activity:No data recorded  Assets  Assets: Communication Skills; Desire for Improvement; Financial Resources/Insurance; Leisure Time; Physical Health   Sleep  Sleep:No data recorded    Blood pressure (!) 135/99, pulse 63, temperature 98.5 F (36.9 C), resp. rate 16, height 6' (1.829 m), weight 117 kg, SpO2 99%. Body mass index is 34.99 kg/m.   Treatment Plan Summary: Daily contact with patient to assess and evaluate symptoms and progress in treatment, Medication management, and Plan start Elavil 25 mg at bedtime and increase Seroquel to 300 mg at bedtime.  Nurses are going to change his bed.  Sarina Ill, DO 01/24/2023, 12:13 PM

## 2023-01-24 NOTE — Progress Notes (Signed)
   01/24/23 0709  Psych Admission Type (Psych Patients Only)  Admission Status Voluntary  Psychosocial Assessment  Patient Complaints None  Eye Contact Fair  Facial Expression Flat  Affect Depressed;Anxious  Speech Soft  Interaction Childlike  Motor Activity Slow  Appearance/Hygiene In scrubs;Disheveled  Behavior Characteristics Cooperative  Mood Sad  Thought Process  Coherency WDL  Content WDL  Delusions None reported or observed  Perception WDL  Hallucination None reported or observed  Judgment Impaired  Confusion None  Danger to Self  Current suicidal ideation? Denies  Danger to Others  Danger to Others None reported or observed

## 2023-01-24 NOTE — Group Note (Signed)
Date:  01/24/2023 Time:  9:13 PM  Group Topic/Focus:  Developing a Wellness Toolbox:   The focus of this group is to help patients develop a "wellness toolbox" with skills and strategies to promote recovery upon discharge.    Participation Level:  Active  Participation Quality:  Appropriate  Affect:  Appropriate  Cognitive:  Appropriate  Insight: Appropriate  Engagement in Group:  Engaged  Modes of Intervention:  Confrontation  Additional Comments:    Garry Heater 01/24/2023, 9:13 PM
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
## 2023-01-24 NOTE — Group Note (Signed)
Date:  01/24/2023 Time:  10:50 AM  Group Topic/Focus:  Overcoming Stress:   The focus of this group is to define stress and help patients assess their triggers.    Participation Level:  Active  Participation Quality:  Appropriate  Affect:  Appropriate  Cognitive:  Appropriate  Insight: Appropriate  Engagement in Group:  Engaged  Modes of Intervention:  Discussion   Ardelle Anton 01/24/2023, 10:50 AM

## 2023-01-24 NOTE — Group Note (Signed)
Recreation Therapy Group Note   Group Topic:Coping Skills  Group Date: 01/24/2023 Start Time: 1400 End Time: 1450 Facilitators: Rosina Lowenstein, LRT, CTRS Location: Dayroom  Group Description: Meditation. LRT and patients discussed what they know about meditation and mindfulness. LRT played a Deep Breathing Meditation exercise script for patients to follow along to. LRT and patients discussed how meditation and deep breathing can be used as a coping skill post--discharge.   Goal Area(s) Addressed: Patient will practice using relaxation technique. Patient will identify a new coping skill.  Patient will follow multistep directions to reduce anxiety and stress.   Affect/Mood: N/A   Participation Level: Did not attend    Clinical Observations/Individualized Feedback: Travis [name] did not attend group.   Plan: Continue to engage patient in RT group sessions 2-3x/week.   Rosina Lowenstein, LRT, CTRS 01/24/2023 3:06 PM

## 2023-01-24 NOTE — Progress Notes (Signed)
   01/24/23 0615  15 Minute Checks  Location Bedroom  Visual Appearance Calm  Behavior Sleeping  Sleep (Behavioral Health Patients Only)  Calculate sleep? (Click Yes once per 24 hr at 0600 safety check) Yes  Documented sleep last 24 hours 7.5

## 2023-01-24 NOTE — Plan of Care (Signed)
  Problem: Education: Goal: Knowledge of [facility] Education information/materials will improve Outcome: Not Progressing Goal: Emotional status will improve Outcome: Not Progressing Goal: Mental status will improve Outcome: Not Progressing Goal: Verbalization of understanding the information provided will improve Outcome: Not Progressing   Problem: Activity: Goal: Interest or engagement in activities will improve Outcome: Not Progressing Goal: Sleeping patterns will improve Outcome: Not Progressing   Problem: Coping: Goal: Ability to verbalize frustrations and anger appropriately will improve Outcome: Not Progressing Goal: Ability to demonstrate self-control will improve Outcome: Not Progressing   Problem: Health Behavior/Discharge Planning: Goal: Identification of resources available to assist in meeting health care needs will improve Outcome: Not Progressing Goal: Compliance with treatment plan for underlying cause of condition will improve Outcome: Not Progressing   Problem: Physical Regulation: Goal: Ability to maintain clinical measurements within normal limits will improve Outcome: Not Progressing   Problem: Safety: Goal: Periods of time without injury will increase Outcome: Not Progressing   Problem: Education: Goal: Utilization of techniques to improve thought processes will improve Outcome: Not Progressing Goal: Knowledge of the prescribed therapeutic regimen will improve Outcome: Not Progressing   Problem: Activity: Goal: Interest or engagement in leisure activities will improve Outcome: Not Progressing Goal: Imbalance in normal sleep/wake cycle will improve Outcome: Not Progressing   Problem: Coping: Goal: Coping ability will improve Outcome: Not Progressing Goal: Will verbalize feelings Outcome: Not Progressing   Problem: Health Behavior/Discharge Planning: Goal: Ability to make decisions will improve Outcome: Not Progressing Goal:  Compliance with therapeutic regimen will improve Outcome: Not Progressing   Problem: Role Relationship: Goal: Will demonstrate positive changes in social behaviors and relationships Outcome: Not Progressing   Problem: Safety: Goal: Ability to disclose and discuss suicidal ideas will improve Outcome: Not Progressing Goal: Ability to identify and utilize support systems that promote safety will improve Outcome: Not Progressing   Problem: Self-Concept: Goal: Will verbalize positive feelings about self Outcome: Not Progressing Goal: Level of anxiety will decrease Outcome: Not Progressing   Problem: Education: Goal: Ability to incorporate positive changes in behavior to improve self-esteem will improve Outcome: Not Progressing   Problem: Health Behavior/Discharge Planning: Goal: Ability to identify and utilize available resources and services will improve Outcome: Not Progressing Goal: Ability to remain free from injury will improve Outcome: Not Progressing   Problem: Self-Concept: Goal: Will verbalize positive feelings about self Outcome: Not Progressing   Problem: Skin Integrity: Goal: Demonstration of wound healing without infection will improve Outcome: Not Progressing

## 2023-01-25 DIAGNOSIS — F332 Major depressive disorder, recurrent severe without psychotic features: Secondary | ICD-10-CM | POA: Diagnosis not present

## 2023-01-25 MED ORDER — AMITRIPTYLINE HCL 10 MG PO TABS
50.0000 mg | ORAL_TABLET | Freq: Every day | ORAL | Status: DC
  Administered 2023-01-25 – 2023-01-26 (×2): 50 mg via ORAL
  Filled 2023-01-25 (×2): qty 5

## 2023-01-25 MED ORDER — METOPROLOL SUCCINATE ER 25 MG PO TB24
50.0000 mg | ORAL_TABLET | Freq: Every day | ORAL | Status: DC
  Administered 2023-01-26 – 2023-02-09 (×13): 50 mg via ORAL
  Filled 2023-01-25 (×15): qty 2

## 2023-01-25 MED ORDER — LOPERAMIDE HCL 2 MG PO CAPS
2.0000 mg | ORAL_CAPSULE | ORAL | Status: DC | PRN
  Administered 2023-01-25 – 2023-01-26 (×2): 2 mg via ORAL
  Filled 2023-01-25 (×3): qty 1

## 2023-01-25 MED ORDER — NICOTINE POLACRILEX 2 MG MT GUM
2.0000 mg | CHEWING_GUM | OROMUCOSAL | Status: DC | PRN
  Administered 2023-01-25 – 2023-01-26 (×2): 2 mg via ORAL
  Filled 2023-01-25 (×2): qty 1

## 2023-01-25 MED ORDER — LOSARTAN POTASSIUM 25 MG PO TABS
100.0000 mg | ORAL_TABLET | Freq: Every day | ORAL | Status: DC
  Administered 2023-01-26 – 2023-01-27 (×2): 100 mg via ORAL
  Filled 2023-01-25 (×2): qty 4

## 2023-01-25 NOTE — Plan of Care (Signed)
Patient alert and oriented. Bright affect. Reports feeling better than yesterday. Interacting with peers and staff. Visible in dayroom watching tv and attending group. Po medication given as scheduled. Tol well. Q 15 min checks maintained for safety. No behavior issues noted. Denies SI, HI, AVH, and pain. Patient remains safe at this time.   Problem: Activity: Goal: Interest or engagement in activities will improve Outcome: Progressing Goal: Sleeping patterns will improve Outcome: Progressing   Problem: Coping: Goal: Ability to verbalize frustrations and anger appropriately will improve Outcome: Progressing Goal: Ability to demonstrate self-control will improve Outcome: Progressing   Problem: Safety: Goal: Periods of time without injury will increase Outcome: Progressing

## 2023-01-25 NOTE — Progress Notes (Signed)
[facility] MD Progress Note  01/25/2023 12:36 PM Travis [name]  MRN:  [id number] Subjective: Travis [name] is seen on rounds.  He slept a little bit better last night.  He still very depressed and suicidal.  He is tolerating the medications without any side effects.  Nurses report no problems.  He denies any problems at the time.  He has not spoken to his sister. Principal Problem: Major depressive disorder, recurrent episode, severe (HCC) Diagnosis: Principal Problem:   Major depressive disorder, recurrent episode, severe (HCC) Active Problems:   Methamphetamine abuse (HCC)  Total Time spent with patient: 15 minutes  Past Psychiatric History: Depression and polysubstance abuse  Past Medical History:  Past Medical History:  Diagnosis Date   Anxiety    Panic attacks   Chest pain    Hospital, March, 2014, negative enzymes, patient refused in-hospital  stress test, patient canceled outpatient stress test   Constipation    Degenerative disk disease    Degenerative disk disease    Depression    GERD (gastroesophageal reflux disease)    Hypertension    Incontinence of urine    Neuromuscular disorder (HCC)    Obesity    Polysubstance abuse (HCC)    PTSD (post-traumatic stress disorder)    Pulmonary emboli (HCC)    Seizures (HCC)    Shortness of breath dyspnea    with exertion   Spinal stenosis    Spinal stenosis    Suicide attempt (HCC)    Tachycardia - pulse    Tobacco abuse     Past Surgical History:  Procedure Laterality Date   COLONOSCOPY N/A 08/24/2014   Procedure: COLONOSCOPY;  Surgeon: Charlott Rakes, MD;  Location: [facility] ENDOSCOPY;  Service: Endoscopy;  Laterality: N/A;   ESOPHAGOGASTRODUODENOSCOPY (EGD) WITH PROPOFOL N/A 08/24/2014   Procedure: ESOPHAGOGASTRODUODENOSCOPY (EGD) WITH PROPOFOL;  Surgeon: Charlott Rakes, MD;  Location: [facility] ENDOSCOPY;  Service: Endoscopy;  Laterality: N/A;   WISDOM TOOTH EXTRACTION     Family History:  Family History  Problem Relation Age of Onset    Stroke Mother    Hypertension Mother    Hyperlipidemia Mother    Stroke Father    Hypertension Father    Dementia Father    Diabetes Father    Heart disease Father    Hyperlipidemia Father    Cancer Sister        brain   Diabetes Sister    Heart disease Sister    Hyperlipidemia Sister    Hypertension Sister    Stroke Sister    Heart disease Brother    Hyperlipidemia Brother    Family Psychiatric  History: Unremarkable Social History:  Social History   Substance and Sexual Activity  Alcohol Use No     Social History   Substance and Sexual Activity  Drug Use Not Currently   Types: Amphetamines, Marijuana, Benzodiazepines    Social History   Socioeconomic History   Marital status: Divorced    Spouse name: Not on file   Number of children: Not on file   Years of education: Not on file   Highest education level: Not on file  Occupational History   Not on file  Tobacco Use   Smoking status: Every Day    Current packs/day: 0.00    Types: Cigarettes    Start date: 12/12/2016    Last attempt to quit: 12/12/2016    Years since quitting: 6.1   Smokeless tobacco: Never  Vaping Use   Vaping status: Every Day   Substances: Nicotine  Substance and Sexual Activity   Alcohol use: No   Drug use: Not Currently    Types: Amphetamines, Marijuana, Benzodiazepines   Sexual activity: Never  Other Topics Concern   Not on file  Social History Narrative   Not on file   Social Determinants of Health   Financial Resource Strain: High Risk (01/09/2023)   Received from [facility]   Overall Financial Resource Strain (CARDIA)    Difficulty of Paying Living Expenses: Hard  Food Insecurity: Food Insecurity Present (01/23/2023)   Hunger Vital Sign    Worried About Running Out of Food in the Last Year: Sometimes true    Ran Out of Food in the Last Year: Sometimes true  Transportation Needs: Unmet Transportation Needs (01/23/2023)   PRAPARE - Scientist, research (physical sciences) (Medical): Yes    Lack of Transportation (Non-Medical): Yes  Physical Activity: Inactive (01/09/2023)   Received from [facility]   Exercise Vital Sign    Days of Exercise per Week: 0 days    Minutes of Exercise per Session: 10 min  Stress: Stress Concern Present (01/09/2023)   Received from [facility] - Occupational Stress Questionnaire    Feeling of Stress : Rather much  Social Connections: Moderately Integrated (01/09/2023)   Received from [facility]   Social Network    How would you rate your social network (family, work, friends)?: Adequate participation with social networks   Additional Social History:                         Sleep: Good  Appetite:  Good  Current Medications: Current Facility-Administered Medications  Medication Dose Route Frequency Provider Last Rate Last Admin   acetaminophen (TYLENOL) tablet 650 mg  650 mg Oral Q6H PRN Maryagnes Amos, FNP   650 mg at 01/25/23 0514   alum & mag hydroxide-simeth (MAALOX/MYLANTA) 200-200-20 MG/5ML suspension 30 mL  30 mL Oral Q4H PRN Maryagnes Amos, FNP       amitriptyline (ELAVIL) tablet 50 mg  50 mg Oral QHS Dayane Hillenburg Ramon Dredge, DO       cloNIDine (CATAPRES) tablet 0.2 mg  0.2 mg Oral Q8H PRN Sarina Ill, DO       cyclobenzaprine (FLEXERIL) tablet 10 mg  10 mg Oral TID PRN Sarina Ill, DO       escitalopram (LEXAPRO) tablet 10 mg  10 mg Oral QPC breakfast Sarina Ill, DO   10 mg at 01/25/23 0855   famotidine (PEPCID) tablet 20 mg  20 mg Oral Daily PRN Maryagnes Amos, FNP   20 mg at 01/24/23 0804   gabapentin (NEURONTIN) capsule 1,200 mg  1,200 mg Oral TID Maryagnes Amos, FNP   1,200 mg at 01/25/23 0855   lisinopril (ZESTRIL) tablet 20 mg  20 mg Oral Daily Maryagnes Amos, FNP   20 mg at 01/25/23 0855   LORazepam (ATIVAN) tablet 1 mg  1 mg Oral Q4H PRN Sarina Ill, DO   1 mg at 01/25/23 1610   magnesium hydroxide (MILK OF MAGNESIA) suspension 30 mL  30 mL Oral Daily PRN Maryagnes Amos, FNP       metFORMIN (GLUCOPHAGE-XR) 24 hr tablet 500 mg  500 mg Oral Q breakfast Maryagnes Amos, FNP   500 mg at 01/24/23 0804   metoprolol tartrate (LOPRESSOR) tablet 100 mg  100 mg Oral QPC breakfast Elane Fritz  Edward, DO   100 mg at 01/25/23 0855   nicotine (NICODERM CQ - dosed in mg/24 hours) patch 21 mg  21 mg Transdermal Daily Sarina Ill, DO   21 mg at 01/25/23 0854   OLANZapine (ZYPREXA) tablet 10 mg  10 mg Oral Q6H PRN Sarina Ill, DO       pantoprazole (PROTONIX) EC tablet 40 mg  40 mg Oral Daily Maryagnes Amos, FNP   40 mg at 01/25/23 0855   QUEtiapine (SEROQUEL) tablet 300 mg  300 mg Oral QHS Sarina Ill, DO   300 mg at 01/24/23 2140    Lab Results: No results found for this or any previous visit (from the past 48 hour(s)).  Blood Alcohol level:  Lab Results  Component Value Date   ETH <10 01/20/2023   ETH <10 02/28/2018    Metabolic Disorder Labs: Lab Results  Component Value Date   HGBA1C 5.9 10/12/2013   MPG 123 (H) 05/29/2012   No results found for: "PROLACTIN" Lab Results  Component Value Date   CHOL 115 05/30/2012   TRIG 87 05/30/2012   HDL 25 (L) 05/30/2012   CHOLHDL 4.6 05/30/2012   VLDL 17 05/30/2012   LDLCALC 73 05/30/2012   LDLCALC 69 11/15/2008    Physical Findings: AIMS:  , ,  ,  ,    CIWA:    COWS:     Musculoskeletal: Strength & Muscle Tone: within normal limits Gait & Station: normal Patient leans: N/A  Psychiatric Specialty Exam:  Presentation  General Appearance:  Appropriate for Environment  Eye Contact: Good  Speech: Clear and Coherent; Normal Rate  Speech Volume: Normal  Handedness: Right   Mood and Affect  Mood: Anxious; Dysphoric  Affect: Congruent   Thought Process  Thought Processes: Coherent; Goal  Directed  Descriptions of Associations:Intact  Orientation:Full (Time, Place and Person)  Thought Content:Logical; WDL  History of Schizophrenia/Schizoaffective disorder:No  Duration of Psychotic Symptoms:Greater than six months  Hallucinations:No data recorded Ideas of Reference:None  Suicidal Thoughts:No data recorded Homicidal Thoughts:No data recorded  Sensorium  Memory: Immediate Fair; Recent Good  Judgment: Intact  Insight: Fair; Present   Executive Functions  Concentration: Good  Attention Span: Good  Recall: Good  Fund of Knowledge: Good  Language: Good   Psychomotor Activity  Psychomotor Activity:No data recorded  Assets  Assets: Communication Skills; Desire for Improvement; Financial Resources/Insurance; Leisure Time; Physical Health   Sleep  Sleep:No data recorded    Blood pressure (!) 135/114, pulse 79, temperature 98.3 F (36.8 C), resp. rate 18, height 6' (1.829 m), weight 117 kg, SpO2 96%. Body mass index is 34.99 kg/m.   Treatment Plan Summary: Daily contact with patient to assess and evaluate symptoms and progress in treatment, Medication management, and Plan increase Elavil to 50 mg at bedtime.  Sarina Ill, DO 01/25/2023, 12:36 PM

## 2023-01-25 NOTE — Plan of Care (Signed)
   Problem: Education: Goal: Emotional status will improve Outcome: Progressing Goal: Mental status will improve Outcome: Progressing Goal: Verbalization of understanding the information provided will improve Outcome: Progressing

## 2023-01-25 NOTE — Progress Notes (Signed)
 Patient requested nicorette gum because the patch continually fell off.   Dr. Marlou Porch made aware. Nicorette gum ordered. Nicotine patch discontinued.

## 2023-01-25 NOTE — Progress Notes (Signed)
 Patient requested medication for diarrhea.  Dr. Marlou Porch made aware.  PRN medication given as ordered.

## 2023-01-25 NOTE — Progress Notes (Signed)
 Patient pleasant and cooperative. Appropriate affect.  Endorses anxiety and agitation.   Endorses passive SI.  Contracts for safety on the unit. Denies HI and AVH. Denies pain.   Compliant with scheduled medications. 15 min checks in place for safety.  Patient is present in the milieu.  Appropriate interaction with peers and staff. Participated in SW group.   Dr. Marlou Porch made aware of patent's high BP (148/114). Adjustments to medications made (by Dr. Marlou Porch).    PRN medication for anxiety given x 3.  PRN pain medication given for back pain (10/10)  x1.

## 2023-01-25 NOTE — Group Note (Signed)
LCSW Group Therapy Note   Group Date: 01/25/2023 Start Time: 1310 End Time: 1340   Type of Therapy and Topic:  Group Therapy: Geriatric Depression  Participation Level:  Active  Summary of Patient Progress:    Travis [name] shared that Travis [name] was his favorite season during the ice breaker. Travis [name] struggled with the limitations of his physical health and that it often made him feel down. He further shared that connecting with his family often raised his spirits.    Travis [name], LCSWA 01/25/2023  4:54 PM

## 2023-01-25 NOTE — [facility] Suicide Risk Assessment (Signed)
[facility] INPATIENT:  Family/Significant Other Suicide Prevention Education  Suicide Prevention Education:  Contact Attempts: Berton Lan, sister, [phone],  has been identified by the patient as the family member/significant other with whom the patient will be residing, and identified as the person(s) who will aid the patient in the event of a mental health crisis.  With written consent from the patient, two attempts were made to provide suicide prevention education, prior to and/or following the patient's discharge.  We were unsuccessful in providing suicide prevention education.  A suicide education pamphlet was given to the patient to share with family/significant other.  Date and time of first attempt:01/25/23 at 12:00 pm Date and time of second attempt: second attempt needed.  Marshell Levan 01/25/2023, 5:21 PM

## 2023-01-26 DIAGNOSIS — F332 Major depressive disorder, recurrent severe without psychotic features: Secondary | ICD-10-CM | POA: Diagnosis not present

## 2023-01-26 MED ORDER — LOPERAMIDE HCL 2 MG PO CAPS
4.0000 mg | ORAL_CAPSULE | ORAL | Status: DC | PRN
  Administered 2023-01-26: 4 mg via ORAL
  Filled 2023-01-26: qty 2

## 2023-01-26 MED ORDER — CLONIDINE HCL 0.1 MG PO TABS
0.1000 mg | ORAL_TABLET | Freq: Two times a day (BID) | ORAL | Status: DC
  Administered 2023-01-26 – 2023-02-11 (×31): 0.1 mg via ORAL
  Filled 2023-01-26 (×33): qty 1

## 2023-01-26 NOTE — Plan of Care (Signed)
  Problem: Education: Goal: Knowledge of [facility] Education information/materials will improve Outcome: Progressing Goal: Emotional status will improve Outcome: Progressing Goal: Mental status will improve Outcome: Progressing Goal: Verbalization of understanding the information provided will improve Outcome: Progressing   Problem: Activity: Goal: Interest or engagement in activities will improve Outcome: Progressing Goal: Sleeping patterns will improve Outcome: Progressing   Problem: Coping: Goal: Ability to verbalize frustrations and anger appropriately will improve Outcome: Progressing Goal: Ability to demonstrate self-control will improve Outcome: Progressing   Problem: Health Behavior/Discharge Planning: Goal: Identification of resources available to assist in meeting health care needs will improve Outcome: Progressing Goal: Compliance with treatment plan for underlying cause of condition will improve Outcome: Progressing   Problem: Physical Regulation: Goal: Ability to maintain clinical measurements within normal limits will improve Outcome: Progressing   Problem: Safety: Goal: Periods of time without injury will increase Outcome: Progressing   Problem: Education: Goal: Utilization of techniques to improve thought processes will improve Outcome: Progressing Goal: Knowledge of the prescribed therapeutic regimen will improve Outcome: Progressing   Problem: Activity: Goal: Interest or engagement in leisure activities will improve Outcome: Progressing Goal: Imbalance in normal sleep/wake cycle will improve Outcome: Progressing   Problem: Coping: Goal: Coping ability will improve Outcome: Progressing Goal: Will verbalize feelings Outcome: Progressing   Problem: Health Behavior/Discharge Planning: Goal: Ability to make decisions will improve Outcome: Progressing Goal: Compliance with therapeutic regimen will improve Outcome: Progressing    Problem: Role Relationship: Goal: Will demonstrate positive changes in social behaviors and relationships Outcome: Progressing   Problem: Safety: Goal: Ability to disclose and discuss suicidal ideas will improve Outcome: Progressing Goal: Ability to identify and utilize support systems that promote safety will improve Outcome: Progressing   Problem: Self-Concept: Goal: Will verbalize positive feelings about self Outcome: Progressing Goal: Level of anxiety will decrease Outcome: Progressing   Problem: Education: Goal: Ability to incorporate positive changes in behavior to improve self-esteem will improve Outcome: Progressing   Problem: Health Behavior/Discharge Planning: Goal: Ability to identify and utilize available resources and services will improve Outcome: Progressing Goal: Ability to remain free from injury will improve Outcome: Progressing   Problem: Self-Concept: Goal: Will verbalize positive feelings about self Outcome: Progressing   Problem: Skin Integrity: Goal: Demonstration of wound healing without infection will improve Outcome: Progressing

## 2023-01-26 NOTE — Plan of Care (Signed)
  Problem: Education: Goal: Emotional status will improve Outcome: Progressing Goal: Mental status will improve Outcome: Progressing Goal: Verbalization of understanding the information provided will improve Outcome: Progressing  Patient stated he is doing better today though he still endorses Passive SI but feels safe while he is here. Compliant with medications no adverse effects noted. Encouragement and Support provided.

## 2023-01-26 NOTE — Group Note (Signed)
Date:  01/26/2023 Time:  10:28 PM  Group Topic/Focus:  Making Healthy Choices:   The focus of this group is to help patients identify negative/unhealthy choices they were using prior to admission and identify positive/healthier coping strategies to replace them upon discharge.    Participation Level:  Minimal  Participation Quality:  Appropriate  Affect:  Appropriate  Cognitive:  Appropriate  Insight: Good  Engagement in Group:  Engaged  Modes of Intervention:  Discussion  Additional Comments:    Travis [name] 01/26/2023, 10:28 PM

## 2023-01-26 NOTE — [facility] Suicide Risk Assessment (Signed)
[facility] INPATIENT:  Family/Significant Other Suicide Prevention Education  Suicide Prevention Education:  Contact Attempts: Travis [name], sister, [phone], has been identified by the patient as the family member/significant other with whom the patient will be residing, and identified as the person(s) who will aid the patient in the event of a mental health crisis.  With written consent from the patient, two attempts were made to provide suicide prevention education, prior to and/or following the patient's discharge.  We were unsuccessful in providing suicide prevention education.  A suicide education pamphlet was given to the patient to share with family/significant other.  Date and time of first attempt: 01/25/23 at 12:00 pm  Date and time of second attempt:01/25/22/4:43 PM  Travis [name] 01/26/2023, 4:42 PM

## 2023-01-26 NOTE — Progress Notes (Signed)
 Patient irritable this morning due to complaints of pain and diarrhea.  Declined breakfast. Dr. Marlou Porch made aware of diarrhea.  PRN medication given as ordered.  Gabapentin given early for pain per patient request.  Endorses anxiety and passive SI.  Contracts for safety on the unit.  Denies HI and AVH.    Patient refused metformin, stating he does not take this medication.  Compliant with all other scheduled medications. 15 min checks in place for safety. Patient isolated to room with exception of meals.   PRNs given for diarrhea x 1.  PRN for anxiety given x 3.  PRN for muscle spasms given x 1.

## 2023-01-26 NOTE — Plan of Care (Signed)
   Problem: Coping: Goal: Ability to demonstrate self-control will improve Outcome: Progressing   Problem: Education: Goal: Knowledge of the prescribed therapeutic regimen will improve Outcome: Progressing

## 2023-01-26 NOTE — Progress Notes (Signed)
[facility] MD Progress Note  01/26/2023 12:32 PM Travis [name]  MRN:  [id number] Subjective: Travis [name] is seen on rounds.  He is still having trouble with his blood pressure.  Total number to go ahead and schedule the clonidine.  Other than that, he has no complaints.  He still feels depressed and suicidal.  He has been compliant with medications.  No side effects. Principal Problem: Major depressive disorder, recurrent episode, severe (HCC) Diagnosis: Principal Problem:   Major depressive disorder, recurrent episode, severe (HCC) Active Problems:   Methamphetamine abuse (HCC)  Total Time spent with patient: 15 minutes  Past Psychiatric History: Depression and polysubstance abuse  Past Medical History:  Past Medical History:  Diagnosis Date   Anxiety    Panic attacks   Chest pain    Hospital, March, 2014, negative enzymes, patient refused in-hospital  stress test, patient canceled outpatient stress test   Constipation    Degenerative disk disease    Degenerative disk disease    Depression    GERD (gastroesophageal reflux disease)    Hypertension    Incontinence of urine    Neuromuscular disorder (HCC)    Obesity    Polysubstance abuse (HCC)    PTSD (post-traumatic stress disorder)    Pulmonary emboli (HCC)    Seizures (HCC)    Shortness of breath dyspnea    with exertion   Spinal stenosis    Spinal stenosis    Suicide attempt (HCC)    Tachycardia - pulse    Tobacco abuse     Past Surgical History:  Procedure Laterality Date   COLONOSCOPY N/A 08/24/2014   Procedure: COLONOSCOPY;  Surgeon: Charlott Rakes, MD;  Location: [facility] ENDOSCOPY;  Service: Endoscopy;  Laterality: N/A;   ESOPHAGOGASTRODUODENOSCOPY (EGD) WITH PROPOFOL N/A 08/24/2014   Procedure: ESOPHAGOGASTRODUODENOSCOPY (EGD) WITH PROPOFOL;  Surgeon: Charlott Rakes, MD;  Location: [facility] ENDOSCOPY;  Service: Endoscopy;  Laterality: N/A;   WISDOM TOOTH EXTRACTION     Family History:  Family History  Problem Relation Age of  Onset   Stroke Travis [name]    Hypertension Travis [name]    Hyperlipidemia Travis [name]    Stroke Travis [name]    Hypertension Travis [name]    Dementia Travis [name]    Diabetes Travis [name]    Heart disease Travis [name]    Hyperlipidemia Travis [name]    Cancer Travis [name]        brain   Diabetes Travis [name]    Heart disease Travis [name]    Hyperlipidemia Travis [name]    Hypertension Travis [name]    Stroke Travis [name]    Heart disease Travis [name]    Hyperlipidemia Travis [name]    Family Psychiatric  History: Unremarkable Social History:  Social History   Substance and Sexual Activity  Alcohol Use No     Social History   Substance and Sexual Activity  Drug Use Not Currently   Types: Amphetamines, Marijuana, Benzodiazepines    Social History   Socioeconomic History   Marital status: Divorced    Spouse name: Not on file   Number of children: Not on file   Years of education: Not on file   Highest education level: Not on file  Occupational History   Not on file  Tobacco Use   Smoking status: Every Day    Current packs/day: 0.00    Types: Cigarettes    Start date: 12/12/2016    Last attempt to quit: 12/12/2016    Years since quitting: 6.1   Smokeless tobacco: Never  Vaping Use   Vaping status: Every Day   Substances: Nicotine  Substance and Sexual Activity   Alcohol use: No   Drug use: Not Currently    Types: Amphetamines, Marijuana, Benzodiazepines   Sexual activity: Never  Other Topics Concern   Not on file  Social History Narrative   Not on file   Social Determinants of Health   Financial Resource Strain: High Risk (01/09/2023)   Received from [facility]   Overall Financial Resource Strain (CARDIA)    Difficulty of Paying Living Expenses: Hard  Food Insecurity: Food Insecurity Present (01/23/2023)   Hunger Vital Sign    Worried About Running Out of Food in the Last Year: Sometimes true    Ran Out of Food in the Last Year: Sometimes true  Transportation Needs: Unmet Transportation Needs (01/23/2023)   PRAPARE - Scientist, research (physical sciences) (Medical): Yes    Lack of Transportation (Non-Medical): Yes  Physical Activity: Inactive (01/09/2023)   Received from [facility]   Exercise Vital Sign    Days of Exercise per Week: 0 days    Minutes of Exercise per Session: 10 min  Stress: Stress Concern Present (01/09/2023)   Received from [facility] - Occupational Stress Questionnaire    Feeling of Stress : Rather much  Social Connections: Moderately Integrated (01/09/2023)   Received from [facility]   Social Network    How would you rate your social network (family, work, friends)?: Adequate participation with social networks   Additional Social History:                         Sleep: Good  Appetite:  Good  Current Medications: Current Facility-Administered Medications  Medication Dose Route Frequency Provider Last Rate Last Admin   acetaminophen (TYLENOL) tablet 650 mg  650 mg Oral Q6H PRN Maryagnes Amos, FNP   650 mg at 01/25/23 1704   alum & mag hydroxide-simeth (MAALOX/MYLANTA) 200-200-20 MG/5ML suspension 30 mL  30 mL Oral Q4H PRN Maryagnes Amos, FNP       amitriptyline (ELAVIL) tablet 50 mg  50 mg Oral QHS Sarina Ill, DO   50 mg at 01/25/23 2145   cloNIDine (CATAPRES) tablet 0.1 mg  0.1 mg Oral BID Sarina Ill, DO       cloNIDine (CATAPRES) tablet 0.2 mg  0.2 mg Oral Q8H PRN Sarina Ill, DO       cyclobenzaprine (FLEXERIL) tablet 10 mg  10 mg Oral TID PRN Sarina Ill, DO   10 mg at 01/26/23 1132   escitalopram (LEXAPRO) tablet 10 mg  10 mg Oral QPC breakfast Sarina Ill, DO   10 mg at 01/26/23 0932   famotidine (PEPCID) tablet 20 mg  20 mg Oral Daily PRN Maryagnes Amos, FNP   20 mg at 01/24/23 0804   gabapentin (NEURONTIN) capsule 1,200 mg  1,200 mg Oral TID Maryagnes Amos, FNP   1,200 mg at 01/26/23 0801   loperamide (IMODIUM) capsule 4 mg  4 mg Oral PRN  Sarina Ill, DO   4 mg at 01/26/23 0810   LORazepam (ATIVAN) tablet 1 mg  1 mg Oral Q4H PRN Sarina Ill, DO   1 mg at 01/26/23 1209   losartan (COZAAR) tablet 100 mg  100 mg Oral QPC breakfast Sarina Ill, DO   100 mg at 01/26/23 0932   magnesium hydroxide (MILK OF MAGNESIA) suspension 30 mL  30 mL Oral Daily PRN  Maryagnes Amos, FNP       metFORMIN (GLUCOPHAGE-XR) 24 hr tablet 500 mg  500 mg Oral Q breakfast Maryagnes Amos, FNP   500 mg at 01/24/23 0804   metoprolol succinate (TOPROL-XL) 24 hr tablet 50 mg  50 mg Oral Daily Sarina Ill, DO   50 mg at 01/26/23 0932   nicotine polacrilex (NICORETTE) gum 2 mg  2 mg Oral PRN Sarina Ill, DO   2 mg at 01/25/23 1909   OLANZapine (ZYPREXA) tablet 10 mg  10 mg Oral Q6H PRN Sarina Ill, DO       pantoprazole (PROTONIX) EC tablet 40 mg  40 mg Oral Daily Maryagnes Amos, FNP   40 mg at 01/26/23 0932   QUEtiapine (SEROQUEL) tablet 300 mg  300 mg Oral QHS Sarina Ill, DO   300 mg at 01/25/23 2145    Lab Results: No results found for this or any previous visit (from the past 48 hour(s)).  Blood Alcohol level:  Lab Results  Component Value Date   ETH <10 01/20/2023   ETH <10 02/28/2018    Metabolic Disorder Labs: Lab Results  Component Value Date   HGBA1C 5.9 10/12/2013   MPG 123 (H) 05/29/2012   No results found for: "PROLACTIN" Lab Results  Component Value Date   CHOL 115 05/30/2012   TRIG 87 05/30/2012   HDL 25 (L) 05/30/2012   CHOLHDL 4.6 05/30/2012   VLDL 17 05/30/2012   LDLCALC 73 05/30/2012   LDLCALC 69 11/15/2008    Physical Findings: AIMS:  , ,  ,  ,    CIWA:    COWS:     Musculoskeletal: Strength & Muscle Tone: within normal limits Gait & Station: normal Patient leans: N/A  Psychiatric Specialty Exam:  Presentation  General Appearance:  Appropriate for Environment  Eye Contact: Good  Speech: Clear and  Coherent; Normal Rate  Speech Volume: Normal  Handedness: Right   Mood and Affect  Mood: Anxious; Dysphoric  Affect: Congruent   Thought Process  Thought Processes: Coherent; Goal Directed  Descriptions of Associations:Intact  Orientation:Full (Time, Place and Person)  Thought Content:Logical; WDL  History of Schizophrenia/Schizoaffective disorder:No  Duration of Psychotic Symptoms:Greater than six months  Hallucinations:No data recorded Ideas of Reference:None  Suicidal Thoughts:No data recorded Homicidal Thoughts:No data recorded  Sensorium  Memory: Immediate Fair; Recent Good  Judgment: Intact  Insight: Fair; Present   Executive Functions  Concentration: Good  Attention Span: Good  Recall: Good  Fund of Knowledge: Good  Language: Good   Psychomotor Activity  Psychomotor Activity:No data recorded  Assets  Assets: Communication Skills; Desire for Improvement; Financial Resources/Insurance; Leisure Time; Physical Health   Sleep  Sleep:No data recorded    Blood pressure (!) 142/105, pulse (!) 102, temperature 97.7 F (36.5 C), resp. rate 16, height 6' (1.829 m), weight 117 kg, SpO2 96%. Body mass index is 34.99 kg/m.   Treatment Plan Summary: Daily contact with patient to assess and evaluate symptoms and progress in treatment, Medication management, and Plan schedule clonidine 0.1 twice a day.  Sarina Ill, DO 01/26/2023, 12:32 PM

## 2023-01-26 NOTE — Progress Notes (Signed)
   01/26/23 2000  Psych Admission Type (Psych Patients Only)  Admission Status Voluntary  Psychosocial Assessment  Patient Complaints Anxiety  Eye Contact Fair  Facial Expression Anxious  Affect Anxious;Irritable  Speech Logical/coherent  Interaction Assertive  Motor Activity Slow  Appearance/Hygiene In scrubs  Behavior Characteristics Cooperative  Mood Irritable  Thought Process  Coherency WDL  Content WDL  Delusions None reported or observed  Perception WDL  Hallucination None reported or observed  Judgment Impaired  Confusion None  Danger to Self  Current suicidal ideation? Passive  Agreement Not to Harm Self Yes  Description of Agreement VERBAL  Danger to Others  Danger to Others None reported or observed

## 2023-01-26 NOTE — Progress Notes (Signed)
Pt c/o loose stools. PRN given for diarrhea with +effective.

## 2023-01-26 NOTE — Group Note (Signed)
Date:  01/26/2023 Time:  11:19 AM  Group Topic/Focus:  Coping With Mental Health Crisis:   The purpose of this group is to help patients identify strategies for coping with mental health crisis.  Group discusses possible causes of crisis and ways to manage them effectively. Goals Group:   The focus of this group is to help patients establish daily goals to achieve during treatment and discuss how the patient can incorporate goal setting into their daily lives to aide in recovery. Healthy Communication:   The focus of this group is to discuss communication, barriers to communication, as well as healthy ways to communicate with others.    Participation Level:  Did Not Attend  Participation Quality:    Affect:    Cognitive:     Insight:   Engagement in Group:   Modes of Intervention:   Additional Comments:    Travis [name] Travis [name] 01/26/2023, 11:19 AM

## 2023-01-26 NOTE — Plan of Care (Signed)
D- Patient alert and oriented.  Flat affect.  Denies SI, HI, AVH, and pain.  A- Scheduled medications administered to patient, per MD orders. Support and encouragement provided.  Routine safety checks conducted every 15 minutes.  Patient informed to notify staff with problems or concerns. R- No adverse drug reactions noted. Patient contracts for safety at this time. Patient compliant with medications and treatment plan. Patient receptive, calm, and cooperative. Patient interacts well with others on the unit.  Patient remains safe at this time.   Problem: Coping: Goal: Ability to verbalize frustrations and anger appropriately will improve Outcome: Progressing Goal: Ability to demonstrate self-control will improve Outcome: Progressing   Problem: Safety: Goal: Periods of time without injury will increase Outcome: Progressing

## 2023-01-27 DIAGNOSIS — F332 Major depressive disorder, recurrent severe without psychotic features: Secondary | ICD-10-CM | POA: Diagnosis not present

## 2023-01-27 MED ORDER — TRAMADOL HCL 50 MG PO TABS
50.0000 mg | ORAL_TABLET | Freq: Four times a day (QID) | ORAL | Status: DC | PRN
  Administered 2023-01-27 – 2023-01-28 (×5): 50 mg via ORAL
  Filled 2023-01-27 (×5): qty 1

## 2023-01-27 MED ORDER — AMITRIPTYLINE HCL 10 MG PO TABS
25.0000 mg | ORAL_TABLET | Freq: Every day | ORAL | Status: DC
  Administered 2023-01-27 – 2023-02-10 (×15): 25 mg via ORAL
  Filled 2023-01-27 (×16): qty 3

## 2023-01-27 MED ORDER — LOSARTAN POTASSIUM 25 MG PO TABS
50.0000 mg | ORAL_TABLET | Freq: Every day | ORAL | Status: DC
  Administered 2023-01-28 – 2023-02-05 (×9): 50 mg via ORAL
  Filled 2023-01-27 (×9): qty 2

## 2023-01-27 MED ORDER — NICOTINE 14 MG/24HR TD PT24
14.0000 mg | MEDICATED_PATCH | Freq: Every day | TRANSDERMAL | Status: DC
  Administered 2023-01-27 – 2023-02-11 (×16): 14 mg via TRANSDERMAL
  Filled 2023-01-27 (×16): qty 1

## 2023-01-27 NOTE — [facility] Counselor (Signed)
CSW gave pt resources for rehab per their request.   Reynaldo Minium, MSW, [facility] 01/27/2023 4:29 PM

## 2023-01-27 NOTE — Progress Notes (Signed)
[facility] MD Progress Note  01/27/2023 12:35 PM Travis [name]  MRN:  [id number] Subjective: Travis [name] is seen on rounds.  His blood pressure has normalized and actually got a little bit lower so I am going to decrease his Cozaar.  He has no complaints and his mood is slowly improving.  He would like to go to drug and alcohol rehab and that way maybe his sister will take him back and let him live there.  He is suicidal otherwise. Principal Problem: Major depressive disorder, recurrent episode, severe (HCC) Diagnosis: Principal Problem:   Major depressive disorder, recurrent episode, severe (HCC) Active Problems:   Methamphetamine abuse (HCC)  Total Time spent with patient: 15 minutes  Past Psychiatric History: Depression and polysubstance abuse  Past Medical History:  Past Medical History:  Diagnosis Date   Anxiety    Panic attacks   Chest pain    Hospital, March, 2014, negative enzymes, patient refused in-hospital  stress test, patient canceled outpatient stress test   Constipation    Degenerative disk disease    Degenerative disk disease    Depression    GERD (gastroesophageal reflux disease)    Hypertension    Incontinence of urine    Neuromuscular disorder (HCC)    Obesity    Polysubstance abuse (HCC)    PTSD (post-traumatic stress disorder)    Pulmonary emboli (HCC)    Seizures (HCC)    Shortness of breath dyspnea    with exertion   Spinal stenosis    Spinal stenosis    Suicide attempt (HCC)    Tachycardia - pulse    Tobacco abuse     Past Surgical History:  Procedure Laterality Date   COLONOSCOPY N/A 08/24/2014   Procedure: COLONOSCOPY;  Surgeon: Charlott Rakes, MD;  Location: [facility] ENDOSCOPY;  Service: Endoscopy;  Laterality: N/A;   ESOPHAGOGASTRODUODENOSCOPY (EGD) WITH PROPOFOL N/A 08/24/2014   Procedure: ESOPHAGOGASTRODUODENOSCOPY (EGD) WITH PROPOFOL;  Surgeon: Charlott Rakes, MD;  Location: [facility] ENDOSCOPY;  Service: Endoscopy;  Laterality: N/A;   WISDOM TOOTH EXTRACTION      Family History:  Family History  Problem Relation Age of Onset   Stroke Mother    Hypertension Mother    Hyperlipidemia Mother    Stroke Father    Hypertension Father    Dementia Father    Diabetes Father    Heart disease Father    Hyperlipidemia Father    Cancer Sister        brain   Diabetes Sister    Heart disease Sister    Hyperlipidemia Sister    Hypertension Sister    Stroke Sister    Heart disease Brother    Hyperlipidemia Brother    Family Psychiatric  History: Unremarkable Social History:  Social History   Substance and Sexual Activity  Alcohol Use No     Social History   Substance and Sexual Activity  Drug Use Not Currently   Types: Amphetamines, Marijuana, Benzodiazepines    Social History   Socioeconomic History   Marital status: Divorced    Spouse name: Not on file   Number of children: Not on file   Years of education: Not on file   Highest education level: Not on file  Occupational History   Not on file  Tobacco Use   Smoking status: Every Day    Current packs/day: 0.00    Types: Cigarettes    Start date: 12/12/2016    Last attempt to quit: 12/12/2016    Years since quitting: 6.1  Smokeless tobacco: Never  Vaping Use   Vaping status: Every Day   Substances: Nicotine  Substance and Sexual Activity   Alcohol use: No   Drug use: Not Currently    Types: Amphetamines, Marijuana, Benzodiazepines   Sexual activity: Never  Other Topics Concern   Not on file  Social History Narrative   Not on file   Social Determinants of Health   Financial Resource Strain: High Risk (01/09/2023)   Received from [facility]   Overall Financial Resource Strain (CARDIA)    Difficulty of Paying Living Expenses: Hard  Food Insecurity: Food Insecurity Present (01/23/2023)   Hunger Vital Sign    Worried About Running Out of Food in the Last Year: Sometimes true    Ran Out of Food in the Last Year: Sometimes true  Transportation Needs: Unmet  Transportation Needs (01/23/2023)   PRAPARE - Administrator, Civil Service (Medical): Yes    Lack of Transportation (Non-Medical): Yes  Physical Activity: Inactive (01/09/2023)   Received from [facility]   Exercise Vital Sign    Days of Exercise per Week: 0 days    Minutes of Exercise per Session: 10 min  Stress: Stress Concern Present (01/09/2023)   Received from [facility] - Occupational Stress Questionnaire    Feeling of Stress : Rather much  Social Connections: Moderately Integrated (01/09/2023)   Received from [facility]   Social Network    How would you rate your social network (family, work, friends)?: Adequate participation with social networks   Additional Social History:                         Sleep: Good  Appetite:  Good  Current Medications: Current Facility-Administered Medications  Medication Dose Route Frequency Provider Last Rate Last Admin   acetaminophen (TYLENOL) tablet 650 mg  650 mg Oral Q6H PRN Maryagnes Amos, FNP   650 mg at 01/26/23 2104   alum & mag hydroxide-simeth (MAALOX/MYLANTA) 200-200-20 MG/5ML suspension 30 mL  30 mL Oral Q4H PRN Maryagnes Amos, FNP       amitriptyline (ELAVIL) tablet 50 mg  50 mg Oral QHS Sarina Ill, DO   50 mg at 01/26/23 2103   cloNIDine (CATAPRES) tablet 0.1 mg  0.1 mg Oral BID Sarina Ill, DO   0.1 mg at 01/26/23 2104   cyclobenzaprine (FLEXERIL) tablet 10 mg  10 mg Oral TID PRN Sarina Ill, DO   10 mg at 01/27/23 1133   escitalopram (LEXAPRO) tablet 10 mg  10 mg Oral QPC breakfast Sarina Ill, DO   10 mg at 01/27/23 0914   famotidine (PEPCID) tablet 20 mg  20 mg Oral Daily PRN Maryagnes Amos, FNP   20 mg at 01/24/23 0804   gabapentin (NEURONTIN) capsule 1,200 mg  1,200 mg Oral TID Maryagnes Amos, FNP   1,200 mg at 01/27/23 0913   loperamide (IMODIUM) capsule 4 mg  4 mg Oral  PRN Sarina Ill, DO   4 mg at 01/26/23 0810   LORazepam (ATIVAN) tablet 1 mg  1 mg Oral Q4H PRN Sarina Ill, DO   1 mg at 01/27/23 1133   [START ON 01/28/2023] losartan (COZAAR) tablet 50 mg  50 mg Oral QPC breakfast Sarina Ill, DO       magnesium hydroxide (MILK OF MAGNESIA) suspension 30 mL  30 mL Oral Daily PRN Starkes-Perry,  Juel Burrow, FNP       metFORMIN (GLUCOPHAGE-XR) 24 hr tablet 500 mg  500 mg Oral Q breakfast Maryagnes Amos, FNP   500 mg at 01/27/23 0915   metoprolol succinate (TOPROL-XL) 24 hr tablet 50 mg  50 mg Oral Daily Sarina Ill, DO   50 mg at 01/26/23 0932   nicotine (NICODERM CQ - dosed in mg/24 hours) patch 14 mg  14 mg Transdermal Daily Sarina Ill, DO       OLANZapine [facility]) tablet 10 mg  10 mg Oral Q6H PRN Sarina Ill, DO       pantoprazole (PROTONIX) EC tablet 40 mg  40 mg Oral Daily Maryagnes Amos, FNP   40 mg at 01/27/23 0915   QUEtiapine (SEROQUEL) tablet 300 mg  300 mg Oral QHS Sarina Ill, DO   300 mg at 01/26/23 2104    Lab Results: No results found for this or any previous visit (from the past 48 hour(s)).  Blood Alcohol level:  Lab Results  Component Value Date   ETH <10 01/20/2023   ETH <10 02/28/2018    Metabolic Disorder Labs: Lab Results  Component Value Date   HGBA1C 5.9 10/12/2013   MPG 123 (H) 05/29/2012   No results found for: "PROLACTIN" Lab Results  Component Value Date   CHOL 115 05/30/2012   TRIG 87 05/30/2012   HDL 25 (L) 05/30/2012   CHOLHDL 4.6 05/30/2012   VLDL 17 05/30/2012   LDLCALC 73 05/30/2012   LDLCALC 69 11/15/2008    Physical Findings: AIMS:  , ,  ,  ,    CIWA:    COWS:     Musculoskeletal: Strength & Muscle Tone: within normal limits Gait & Station: normal Patient leans: N/A  Psychiatric Specialty Exam:  Presentation  General Appearance:  Appropriate for Environment  Eye Contact: Good  Speech: Clear  and Coherent; Normal Rate  Speech Volume: Normal  Handedness: Right   Mood and Affect  Mood: Anxious; Dysphoric  Affect: Congruent   Thought Process  Thought Processes: Coherent; Goal Directed  Descriptions of Associations:Intact  Orientation:Full (Time, Place and Person)  Thought Content:Logical; WDL  History of Schizophrenia/Schizoaffective disorder:No  Duration of Psychotic Symptoms:Greater than six months  Hallucinations:No data recorded Ideas of Reference:None  Suicidal Thoughts:No data recorded Homicidal Thoughts:No data recorded  Sensorium  Memory: Immediate Fair; Recent Good  Judgment: Intact  Insight: Fair; Present   Executive Functions  Concentration: Good  Attention Span: Good  Recall: Good  Fund of Knowledge: Good  Language: Good   Psychomotor Activity  Psychomotor Activity:No data recorded  Assets  Assets: Communication Skills; Desire for Improvement; Financial Resources/Insurance; Leisure Time; Physical Health   Sleep  Sleep:No data recorded   Physical Exam: Physical Exam Vitals and nursing note reviewed.  Constitutional:      Appearance: Normal appearance. He is normal weight.  Neurological:     General: No focal deficit present.     Mental Status: He is alert and oriented to person, place, and time.  Psychiatric:        Mood and Affect: Mood normal.        Behavior: Behavior normal.    Review of Systems  Constitutional: Negative.   HENT: Negative.    Eyes: Negative.   Respiratory: Negative.    Cardiovascular: Negative.   Gastrointestinal: Negative.   Genitourinary: Negative.   Musculoskeletal: Negative.   Skin: Negative.   Neurological: Negative.   Endo/Heme/Allergies: Negative.   Psychiatric/Behavioral:  Negative.     Blood pressure 106/80, pulse 75, temperature 98.2 F (36.8 C), resp. rate 16, height 6' (1.829 m), weight 117 kg, SpO2 94%. Body mass index is 34.99 kg/m.   Treatment Plan  Summary: Daily contact with patient to assess and evaluate symptoms and progress in treatment, Medication management, and Plan decrease Cozaar to 50 mg/day.  Sarina Ill, DO 01/27/2023, 12:35 PM

## 2023-01-27 NOTE — Group Note (Signed)
Date:  01/27/2023 Time:  10:49 AM  Group Topic/Focus:  Coping With Mental Health Crisis:   The purpose of this group is to help patients identify strategies for coping with mental health crisis.  Group discusses possible causes of crisis and ways to manage them effectively.    Participation Level:  Did Not Attend   Ardelle Anton 01/27/2023, 10:49 AM

## 2023-01-27 NOTE — Progress Notes (Addendum)
Patient is a voluntary admission to [facility] for [facility] and polysubstance abuse.  Patient is looking for drug rehab. Passive SI, HI, AVH, endorses anxiety and depression.  Today's only complaint was his chronic back pain.  Requested an ativan for his back pain.  Explained that ativan is not for back pain and he replied that it was the only thing that helps.  Then stated he wanted the ativan for anxiety and requested flexeril as well.  Medicated with both.  Requested more ativan 4 hrs later and received it.  Asked his nicorette gum be changed back to the patch d/t the gum burning his mouth.  Requesting imodium for diarrhea x 3 days.  Advised I needed to check his stool when he goes.  Was previously medicated yesterday morning for same. Will continue to monitor. Patient came to the desk stating I needed to call the doctor and get him a stronger pain medication.  States his neck is hurting. Gave tylenol for pain.  Doctor added on Ultram q6 prn.

## 2023-01-27 NOTE — Group Note (Signed)
Recreation Therapy Group Note   Group Topic:General Recreation  Group Date: 01/27/2023 Start Time: 1400 End Time: 1500 Facilitators: Rosina Lowenstein, LRT, CTRS Location: Courtyard  Group Description: [employer]. LRT and patients played games of basketball, drew with chalk, and played corn hole while outside in the courtyard while getting fresh air and sunlight. Music was being played in the background. LRT and peers conversed about different games they have played before, what they do in their free time and anything else that is on their minds. LRT encouraged pts to drink water after being outside, sweating and getting their heart rate up.  Goal Area(s) Addressed: Patient will build on frustration tolerance skills. Patients will partake in a competitive play game with peers. Patients will gain knowledge of new leisure interest/hobby.    Affect/Mood: N/A   Participation Level: Did not attend    Clinical Observations/Individualized Feedback: Travis [name] did not attend group.  Plan: Continue to engage patient in RT group sessions 2-3x/week.   Rosina Lowenstein, LRT, CTRS 01/27/2023 3:28 PM

## 2023-01-27 NOTE — Group Note (Unsigned)
Date:  01/28/2023 Time:  12:41 AM  Group Topic/Focus:  Coping With Mental Health Crisis:   The purpose of this group is to help patients identify strategies for coping with mental health crisis.  Group discusses possible causes of crisis and ways to manage them effectively.    Participation Level:  Active  Participation Quality:  Appropriate  Affect:  Appropriate  Cognitive:  Appropriate  Insight: Appropriate  Engagement in Group:  Engaged  Modes of Intervention:  Discussion  Additional Comments:    Maeola Harman 01/28/2023, 12:41 AM

## 2023-01-28 DIAGNOSIS — F1210 Cannabis abuse, uncomplicated: Secondary | ICD-10-CM

## 2023-01-28 DIAGNOSIS — F332 Major depressive disorder, recurrent severe without psychotic features: Secondary | ICD-10-CM | POA: Diagnosis not present

## 2023-01-28 NOTE — Group Note (Signed)
Date:  01/28/2023 Time:  10:48 AM  Group Topic/Focus:  Coping With Mental Health Crisis:   The purpose of this group is to help patients identify strategies for coping with mental health crisis.  Group discusses possible causes of crisis and ways to manage them effectively. Goals Group:   The focus of this group is to help patients establish daily goals to achieve during treatment and discuss how the patient can incorporate goal setting into their daily lives to aide in recovery. Healthy Communication:   The focus of this group is to discuss communication, barriers to communication, as well as healthy ways to communicate with others. Personal Development- We focused on ways to better cope with stressors and challenges.    Participation Level:  Active  Participation Quality:  Appropriate  Affect:  Appropriate  Cognitive:  Appropriate  Insight: Appropriate  Engagement in Group:  Engaged  Modes of Intervention:  Discussion  Additional Comments:    Travis [name] 01/28/2023, 10:48 AM

## 2023-01-28 NOTE — Group Note (Signed)
Recreation Therapy Group Note   Group Topic:Stress Management  Group Date: 01/28/2023 Start Time: 1400 End Time: 1450 Facilitators: Rosina Lowenstein, LRT, CTRS Location: Courtyard  Group Description: Meditation. LRT and patients discussed what they know about meditation and mindfulness. LRT played a Deep Breathing Meditation exercise script for patients to follow along to. LRT and patients discussed how meditation and deep breathing can be used as a coping skill post--discharge to help manage symptoms of stress.  Goal Area(s) Addressed: Patient will practice using relaxation technique. Patient will identify a new coping skill.  Patient will follow multistep directions to reduce anxiety and stress.   Affect/Mood: N/A   Participation Level: Did not attend    Clinical Observations/Individualized Feedback: Travis [name] did not attend group.   Plan: Continue to engage patient in RT group sessions 2-3x/week.   Rosina Lowenstein, LRT, CTRS 01/28/2023 2:58 PM

## 2023-01-28 NOTE — Progress Notes (Signed)
   01/28/23 0718  Psych Admission Type (Psych Patients Only)  Admission Status Voluntary  Psychosocial Assessment  Patient Complaints Anxiety  Eye Contact Brief  Facial Expression Anxious  Affect Anxious  Speech Logical/coherent  Interaction Assertive  Motor Activity Slow  Appearance/Hygiene In scrubs  Behavior Characteristics Cooperative;Calm  Mood Anxious  Thought Process  Coherency WDL  Content WDL  Delusions None reported or observed  Perception WDL  Hallucination None reported or observed  Judgment WDL  Confusion None  Danger to Self  Current suicidal ideation? Denies  Danger to Others  Danger to Others None reported or observed

## 2023-01-28 NOTE — Group Note (Signed)
Date:  01/28/2023 Time:  10:30 PM  Group Topic/Focus:  Overcoming Stress:   The focus of this group is to define stress and help patients assess their triggers. Personal Choices and Values:   The focus of this group is to help patients assess and explore the importance of values in their lives, how their values affect their decisions, how they express their values and what opposes their expression.    Participation Level:  Active  Participation Quality:  Appropriate  Affect:  Appropriate  Cognitive:  Appropriate  Insight: Good  Engagement in Group:  Engaged  Modes of Intervention:  Discussion  Additional Comments:    Travis [name] 01/28/2023, 10:30 PM
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
## 2023-01-28 NOTE — Progress Notes (Signed)
   01/27/23 1922 01/27/23 2356  Vital Signs  Temp (!) 96.4 F (35.8 C) 98.3 F (36.8 C)  Pulse Rate 99 (!) 108  Pulse Rate Source Monitor Monitor  Resp 14 18  BP (!) 135/111 (!) 71/52  BP Location Left Arm Left Arm  BP Method Automatic Automatic  Patient Position (if appropriate) Sitting Sitting  MEWS COLOR  MEWS Score Color Green Yellow  Oxygen Therapy  SpO2 98 % 96 %  O2 Device Room Air Room Air   Patient endorses Passive SI with no plan denies HI/A/VH and verbally contracts for safety Patient is so focused on his Ativan and Tramadol. Patient would not go to his room bedtime he was sleeping in the dayroom in the chair waiting to get his Ultram. Patient interacting well with Peers and Staff. Support and encouragement provided. Ice and heat pack offered to Patient. Patient encouraged PO fluids for low BP and compliant.   Support and encouragement ongoing. Patient remains safe.

## 2023-01-28 NOTE — Group Note (Signed)
[facility] LCSW Group Therapy Note   Group Date: 01/28/2023 Start Time: 1300 End Time: 1400  Type of Therapy/Topic:  Group Therapy:  Feelings about Diagnosis  Participation Level:  Active   Mood: Pleasant    Description of Group:    This group will allow patients to explore their thoughts and feelings about diagnoses they have received. Patients will be guided to explore their level of understanding and acceptance of these diagnoses. Facilitator will encourage patients to process their thoughts and feelings about the reactions of others to their diagnosis, and will guide patients in identifying ways to discuss their diagnosis with significant others in their lives. This group will be process-oriented, with patients participating in exploration of their own experiences as well as giving and receiving support and challenge from other group members.   Therapeutic Goals: 1. Patient will demonstrate understanding of diagnosis as evidence by identifying two or more symptoms of the disorder:  2. Patient will be able to express two feelings regarding the diagnosis 3. Patient will demonstrate ability to communicate their needs through discussion and/or role plays  Summary of Patient Progress:    Pt was very active throughout group and expressed frustration about his treatment that he has received in the past    Therapeutic Modalities:   Cognitive Behavioral Therapy Brief Therapy Feelings Identification    Elza Rafter, LCSWA

## 2023-01-28 NOTE — Plan of Care (Signed)
Problem: Education: Goal: Knowledge of [facility] Education information/materials will improve Outcome: Progressing Goal: Emotional status will improve Outcome: Progressing Goal: Mental status will improve Outcome: Progressing Goal: Verbalization of understanding the information provided will improve Outcome: Progressing   Problem: Activity: Goal: Interest or engagement in activities will improve Outcome: Progressing Goal: Sleeping patterns will improve Outcome: Progressing   Problem: Coping: Goal: Ability to verbalize frustrations and anger appropriately will improve Outcome: Progressing Goal: Ability to demonstrate self-control will improve Outcome: Progressing   Problem: Health Behavior/Discharge Planning: Goal: Identification of resources available to assist in meeting health care needs will improve Outcome: Progressing Goal: Compliance with treatment plan for underlying cause of condition will improve Outcome: Progressing   Problem: Physical Regulation: Goal: Ability to maintain clinical measurements within normal limits will improve Outcome: Progressing   Problem: Safety: Goal: Periods of time without injury will increase Outcome: Progressing   Problem: Education: Goal: Utilization of techniques to improve thought processes will improve Outcome: Progressing Goal: Knowledge of the prescribed therapeutic regimen will improve Outcome: Progressing   Problem: Activity: Goal: Interest or engagement in leisure activities will improve Outcome: Progressing Goal: Imbalance in normal sleep/wake cycle will improve Outcome: Progressing   Problem: Coping: Goal: Coping ability will improve Outcome: Progressing Goal: Will verbalize feelings Outcome: Progressing   Problem: Health Behavior/Discharge Planning: Goal: Ability to make decisions will improve Outcome: Progressing Goal: Compliance with therapeutic regimen will improve Outcome: Progressing    Problem: Role Relationship: Goal: Will demonstrate positive changes in social behaviors and relationships Outcome: Progressing   Problem: Safety: Goal: Ability to disclose and discuss suicidal ideas will improve Outcome: Progressing Goal: Ability to identify and utilize support systems that promote safety will improve Outcome: Progressing   Problem: Self-Concept: Goal: Will verbalize positive feelings about self Outcome: Progressing Goal: Level of anxiety will decrease Outcome: Progressing   Problem: Education: Goal: Ability to incorporate positive changes in behavior to improve self-esteem will improve Outcome: Progressing   Problem: Health Behavior/Discharge Planning: Goal: Ability to identify and utilize available resources and services will improve Outcome: Progressing Goal: Ability to remain free from injury will improve Outcome: Progressing   Problem: Self-Concept: Goal: Will verbalize positive feelings about self Outcome: Progressing   Problem: Skin Integrity: Goal: Demonstration of wound healing without infection will improve Outcome: Progressing

## 2023-01-28 NOTE — Plan of Care (Signed)
  Problem: Education: Goal: Knowledge of [facility] Education information/materials will improve Outcome: Progressing Goal: Emotional status will improve Outcome: Progressing Goal: Mental status will improve Outcome: Progressing   

## 2023-01-29 DIAGNOSIS — F332 Major depressive disorder, recurrent severe without psychotic features: Secondary | ICD-10-CM | POA: Diagnosis not present

## 2023-01-29 DIAGNOSIS — F1210 Cannabis abuse, uncomplicated: Secondary | ICD-10-CM | POA: Diagnosis not present

## 2023-01-29 MED ORDER — TRAMADOL HCL 50 MG PO TABS
50.0000 mg | ORAL_TABLET | Freq: Two times a day (BID) | ORAL | Status: DC | PRN
  Administered 2023-01-29 – 2023-02-11 (×25): 50 mg via ORAL
  Filled 2023-01-29 (×26): qty 1

## 2023-01-29 MED ORDER — GABAPENTIN 300 MG PO CAPS
900.0000 mg | ORAL_CAPSULE | Freq: Three times a day (TID) | ORAL | Status: DC
  Administered 2023-01-29 – 2023-02-11 (×40): 900 mg via ORAL
  Filled 2023-01-29 (×40): qty 3

## 2023-01-29 MED ORDER — LORAZEPAM 0.5 MG PO TABS
0.5000 mg | ORAL_TABLET | Freq: Four times a day (QID) | ORAL | Status: DC | PRN
  Administered 2023-01-30 – 2023-02-10 (×22): 0.5 mg via ORAL
  Filled 2023-01-29 (×22): qty 1

## 2023-01-29 NOTE — Group Note (Signed)
Date:  01/29/2023 Time:  11:09 AM  Group Topic/Focus:  Socialize / Activity    Participation Level:  Active  Participation Quality:  Appropriate  Affect:  Appropriate  Cognitive:  Appropriate  Insight: Appropriate  Engagement in Group:  Engaged  Modes of Intervention:  Activity  Additional Comments:  none  [name] 01/29/2023, 11:09 AM

## 2023-01-29 NOTE — Progress Notes (Addendum)
[facility] MD Progress Note  01/29/2023  Travis [name]  MRN:  [id number]  Travis [name] is a 57 year old white male who was voluntarily admitted to inpatient psychiatry on transfer from [facility]. He endorses anhedonia, difficulty sleeping, depressed mood and suicidal ideation.   Subjective: Chart reviewed, case discussed in multidisciplinary meeting today, patient seen during rounds.  Patient continues to report depressed mood, anhedonia, he feels helpless and hopeless.  He reports suicidal thoughts, denies any intention to harm himself on the unit.  Per staff report patient has been coming to the nursing station to get Ativan and tramadol every 4-6 hours.  Patient's blood pressure and heart rate has been running low.  This was discussed with patient.  Patient was informed that the frequency of Ativan and tramadol will be decreased.  We also discussed decreasing the dose of gabapentin from 1200 mg to 900 mg.  Patient agrees with the plan.  He was encouraged to attend groups and work on coping strategies.  Patient denies psychotic or manic symptoms.  Principal Problem: Major depressive disorder, recurrent episode, severe (HCC) Diagnosis: Principal Problem:   Major depressive disorder, recurrent episode, severe (HCC) Active Problems:   Methamphetamine abuse (HCC)   Mild tetrahydrocannabinol (THC) abuse    Past Medical History:  Past Medical History:  Diagnosis Date   Anxiety    Panic attacks   Chest pain    Hospital, March, 2014, negative enzymes, patient refused in-hospital  stress test, patient canceled outpatient stress test   Constipation    Degenerative disk disease    Degenerative disk disease    Depression    GERD (gastroesophageal reflux disease)    Hypertension    Incontinence of urine    Neuromuscular disorder (HCC)    Obesity    Polysubstance abuse (HCC)    PTSD (post-traumatic stress disorder)    Pulmonary emboli (HCC)    Seizures (HCC)    Shortness of breath dyspnea    with  exertion   Spinal stenosis    Spinal stenosis    Suicide attempt (HCC)    Tachycardia - pulse    Tobacco abuse     Past Surgical History:  Procedure Laterality Date   COLONOSCOPY N/A 08/24/2014   Procedure: COLONOSCOPY;  Surgeon: Charlott Rakes, MD;  Location: [facility] ENDOSCOPY;  Service: Endoscopy;  Laterality: N/A;   ESOPHAGOGASTRODUODENOSCOPY (EGD) WITH PROPOFOL N/A 08/24/2014   Procedure: ESOPHAGOGASTRODUODENOSCOPY (EGD) WITH PROPOFOL;  Surgeon: Charlott Rakes, MD;  Location: [facility] ENDOSCOPY;  Service: Endoscopy;  Laterality: N/A;   WISDOM TOOTH EXTRACTION     Family History:  Family History  Problem Relation Age of Onset   Stroke Mother    Hypertension Mother    Hyperlipidemia Mother    Stroke Father    Hypertension Father    Dementia Father    Diabetes Father    Heart disease Father    Hyperlipidemia Father    Cancer Sister        brain   Diabetes Sister    Heart disease Sister    Hyperlipidemia Sister    Hypertension Sister    Stroke Sister    Heart disease Brother    Hyperlipidemia Brother     Social History:  Social History   Substance and Sexual Activity  Alcohol Use No     Social History   Substance and Sexual Activity  Drug Use Not Currently   Types: Amphetamines, Marijuana, Benzodiazepines    Social History   Socioeconomic History   Marital status: Divorced  Spouse name: Not on file   Number of children: Not on file   Years of education: Not on file   Highest education level: Not on file  Occupational History   Not on file  Tobacco Use   Smoking status: Every Day    Current packs/day: 0.00    Types: Cigarettes    Start date: 12/12/2016    Last attempt to quit: 12/12/2016    Years since quitting: 6.1   Smokeless tobacco: Never  Vaping Use   Vaping status: Every Day   Substances: Nicotine  Substance and Sexual Activity   Alcohol use: No   Drug use: Not Currently    Types: Amphetamines, Marijuana, Benzodiazepines   Sexual activity:  Never  Other Topics Concern   Not on file  Social History Narrative   Not on file   Social Determinants of Health   Financial Resource Strain: High Risk (01/09/2023)   Received from [facility]   Overall Financial Resource Strain (CARDIA)    Difficulty of Paying Living Expenses: Hard  Food Insecurity: Food Insecurity Present (01/23/2023)   Hunger Vital Sign    Worried About Running Out of Food in the Last Year: Sometimes true    Ran Out of Food in the Last Year: Sometimes true  Transportation Needs: Unmet Transportation Needs (01/23/2023)   PRAPARE - Administrator, Civil Service (Medical): Yes    Lack of Transportation (Non-Medical): Yes  Physical Activity: Inactive (01/09/2023)   Received [facility]   Exercise Vital Sign    Days of Exercise per Week: 0 days    Minutes of Exercise per Session: 10 min  Stress: Stress Concern Present (01/09/2023)   Received from [facility] - Occupational Stress Questionnaire    Feeling of Stress : Rather much  Social Connections: Moderately Integrated (01/09/2023)   Received from [facility]   Social Network    How would you rate your social network (family, work, friends)?: Adequate participation with social networks   Additional Social History:     Patient is homeless                    Sleep: Fair  Appetite:  Fair  Current Medications: Current Facility-Administered Medications  Medication Dose Route Frequency Provider Last Rate Last Admin   acetaminophen (TYLENOL) tablet 650 mg  650 mg Oral Q6H PRN Maryagnes Amos, FNP   650 mg at 01/27/23 1653   alum & mag hydroxide-simeth (MAALOX/MYLANTA) 200-200-20 MG/5ML suspension 30 mL  30 mL Oral Q4H PRN Maryagnes Amos, FNP       amitriptyline (ELAVIL) tablet 25 mg  25 mg Oral QHS Sarina Ill, DO   25 mg at 01/28/23 2054   cloNIDine (CATAPRES) tablet 0.1 mg  0.1 mg Oral BID Sarina Ill, DO   0.1 mg at 01/28/23 2054   cyclobenzaprine (FLEXERIL) tablet 10 mg  10 mg Oral TID PRN Sarina Ill, DO   10 mg at 01/28/23 0850   escitalopram (LEXAPRO) tablet 10 mg  10 mg Oral QPC breakfast Sarina Ill, DO   10 mg at 01/29/23 0908   famotidine (PEPCID) tablet 20 mg  20 mg Oral Daily PRN Maryagnes Amos, FNP   20 mg at 01/24/23 0804   gabapentin (NEURONTIN) capsule 1,200 mg  1,200 mg Oral TID Maryagnes Amos, FNP   1,200 mg at 01/29/23 0908   loperamide (IMODIUM) capsule 4  mg  4 mg Oral PRN Sarina Ill, DO   4 mg at 01/26/23 0810   LORazepam (ATIVAN) tablet 0.5 mg  0.5 mg Oral Q6H PRN Lewanda Rife, MD       losartan (COZAAR) tablet 50 mg  50 mg Oral QPC breakfast Sarina Ill, DO   50 mg at 01/29/23 6962   magnesium hydroxide (MILK OF MAGNESIA) suspension 30 mL  30 mL Oral Daily PRN Maryagnes Amos, FNP       metFORMIN (GLUCOPHAGE-XR) 24 hr tablet 500 mg  500 mg Oral Q breakfast Maryagnes Amos, FNP   500 mg at 01/29/23 0908   metoprolol succinate (TOPROL-XL) 24 hr tablet 50 mg  50 mg Oral Daily Sarina Ill, DO   50 mg at 01/28/23 0850   nicotine (NICODERM CQ - dosed in mg/24 hours) patch 14 mg  14 mg Transdermal Daily Sarina Ill, DO   14 mg at 01/29/23 0910   OLANZapine (ZYPREXA) tablet 10 mg  10 mg Oral Q6H PRN Sarina Ill, DO       pantoprazole (PROTONIX) EC tablet 40 mg  40 mg Oral Daily Maryagnes Amos, FNP   40 mg at 01/29/23 9528   QUEtiapine (SEROQUEL) tablet 300 mg  300 mg Oral QHS Sarina Ill, DO   300 mg at 01/28/23 2054   traMADol (ULTRAM) tablet 50 mg  50 mg Oral Q12H PRN Lewanda Rife, MD   50 mg at 01/29/23 1201    Lab Results: No results found for this or any previous visit (from the past 48 hour(s)).  Blood Alcohol level:  Lab Results  Component Value Date   ETH <10 01/20/2023   ETH <10 02/28/2018    Metabolic Disorder  Labs: Lab Results  Component Value Date   HGBA1C 5.9 10/12/2013   MPG 123 (H) 05/29/2012   No results found for: "PROLACTIN" Lab Results  Component Value Date   CHOL 115 05/30/2012   TRIG 87 05/30/2012   HDL 25 (L) 05/30/2012   CHOLHDL 4.6 05/30/2012   VLDL 17 05/30/2012   LDLCALC 73 05/30/2012   LDLCALC 69 11/15/2008    Physical Findings: AIMS:  , ,  ,  ,    CIWA:    COWS:     Musculoskeletal: Strength & Muscle Tone: within normal limits Gait & Station:  WNL Patient leans: N/A  Psychiatric Specialty Exam:  Presentation  General Appearance:  Appropriate for Environment  Eye Contact: Good  Speech: Clear and Coherent; Normal Rate  Speech Volume: Normal  Handedness: Right   Mood and Affect  Mood: Depressed  Affect: Constricted   Thought Process  Thought Processes: Coherent; Goal Directed  Descriptions of Associations:Intact  Orientation:Full (Time, Place and Person)  Thought Content:Logical; WDL  History of Schizophrenia/Schizoaffective disorder:No  Duration of Psychotic Symptoms:Greater than six months  Hallucinations:Denies Ideas of Reference:None  Suicidal Thoughts:Positive Homicidal Thoughts:Denies  Sensorium  Memory: Immediate Fair; Recent Good  Judgment: Intact  Insight: Limited   Executive Functions  Concentration: Good  Attention Span: Good  Recall: Good  Fund of Knowledge: Good  Language: Good   Psychomotor Activity  Psychomotor Activity:Decreased  Assets  Assets: Communication Skills; Desire for Improvement; Financial Resources/Insurance; Leisure Time; Physical Health   Sleep  Sleep:Fair   Physical Exam: Physical Exam HENT:     Head: Normocephalic and atraumatic.     Nose: Nose normal.  Eyes:     Pupils: Pupils are equal, round, and reactive to light.  Pulmonary:     Effort: Pulmonary effort is normal.     Breath sounds: Normal breath sounds.  Skin:    General: Skin is warm.   Neurological:     General: No focal deficit present.     Mental Status: He is alert and oriented to person, place, and time.    Review of Systems  Constitutional:  Negative for fever.  HENT:  Negative for hearing loss and sore throat.   Respiratory:  Negative for cough and shortness of breath.   Cardiovascular:  Negative for chest pain and palpitations.  Gastrointestinal:  Negative for heartburn, nausea and vomiting.  Neurological:  Negative for dizziness and speech change.  Psychiatric/Behavioral:  Positive for depression, substance abuse and suicidal ideas.    Blood pressure 110/80, pulse (!) 59, temperature (!) 97.3 F (36.3 C), resp. rate 18, height 6' (1.829 m), weight 117 kg, SpO2 95%. Body mass index is 34.99 kg/m.   Treatment Plan Summary: Daily contact with patient to assess and evaluate symptoms and progress in treatment and Medication management  Continue on amitriptyline, Seroquel, Lexapro has started on admission.  01/29/23 Decrease the dose of gabapentin from 1200 mg 3 times daily to 900 mg 3 times daily Decrease the dose of Ativan to 0.5 mg as needed every 6 hours for anxiety Decrease dose of tramadol to 50 mg every 12 hours for pain  Lewanda Rife, MD 01/29/2023, 3:03 PM

## 2023-01-29 NOTE — Plan of Care (Signed)
  Problem: Education: Goal: Knowledge of [facility] Education information/materials will improve Outcome: Progressing Goal: Emotional status will improve Outcome: Progressing Goal: Mental status will improve Outcome: Progressing Goal: Verbalization of understanding the information provided will improve Outcome: Progressing   Problem: Activity: Goal: Interest or engagement in activities will improve Outcome: Progressing Goal: Sleeping patterns will improve Outcome: Progressing   Problem: Coping: Goal: Ability to verbalize frustrations and anger appropriately will improve Outcome: Progressing Goal: Ability to demonstrate self-control will improve Outcome: Progressing   Problem: Health Behavior/Discharge Planning: Goal: Identification of resources available to assist in meeting health care needs will improve Outcome: Progressing Goal: Compliance with treatment plan for underlying cause of condition will improve Outcome: Progressing   Problem: Physical Regulation: Goal: Ability to maintain clinical measurements within normal limits will improve Outcome: Progressing   Problem: Safety: Goal: Periods of time without injury will increase Outcome: Progressing   Problem: Education: Goal: Utilization of techniques to improve thought processes will improve Outcome: Progressing Goal: Knowledge of the prescribed therapeutic regimen will improve Outcome: Progressing   Problem: Activity: Goal: Interest or engagement in leisure activities will improve Outcome: Progressing Goal: Imbalance in normal sleep/wake cycle will improve Outcome: Progressing   Problem: Coping: Goal: Coping ability will improve Outcome: Progressing Goal: Will verbalize feelings Outcome: Progressing   Problem: Health Behavior/Discharge Planning: Goal: Ability to make decisions will improve Outcome: Progressing Goal: Compliance with therapeutic regimen will improve Outcome: Progressing    Problem: Role Relationship: Goal: Will demonstrate positive changes in social behaviors and relationships Outcome: Progressing   Problem: Safety: Goal: Ability to disclose and discuss suicidal ideas will improve Outcome: Progressing Goal: Ability to identify and utilize support systems that promote safety will improve Outcome: Progressing   Problem: Self-Concept: Goal: Will verbalize positive feelings about self Outcome: Progressing Goal: Level of anxiety will decrease Outcome: Progressing   Problem: Education: Goal: Ability to incorporate positive changes in behavior to improve self-esteem will improve Outcome: Progressing   Problem: Health Behavior/Discharge Planning: Goal: Ability to identify and utilize available resources and services will improve Outcome: Progressing Goal: Ability to remain free from injury will improve Outcome: Progressing   Problem: Self-Concept: Goal: Will verbalize positive feelings about self Outcome: Progressing   Problem: Skin Integrity: Goal: Demonstration of wound healing without infection will improve Outcome: Progressing

## 2023-01-29 NOTE — Plan of Care (Signed)
  Problem: Education: Goal: Knowledge of [place] [facility] Education information/materials will improve Outcome: Progressing   Problem: Activity: Goal: Interest or engagement in activities will improve Outcome: Progressing   

## 2023-01-29 NOTE — Group Note (Unsigned)
Date:  01/29/2023 Time:  11:02 AM  Group Topic/Focus:  Socialize / Activity     Participation Level:  {BHH PARTICIPATION KGMWN:02725}  Participation Quality:  {BHH PARTICIPATION QUALITY:22265}  Affect:  {BHH AFFECT:22266}  Cognitive:  {BHH COGNITIVE:22267}  Insight: {BHH Insight2:20797}  Engagement in Group:  {BHH ENGAGEMENT IN DGUYQ:03474}  Modes of Intervention:  {BHH MODES OF INTERVENTION:22269}  Additional Comments:  ***  [name] 01/29/2023, 11:02 AM

## 2023-01-29 NOTE — BH IP Treatment Plan (Signed)
Interdisciplinary Treatment and Diagnostic Plan Update  01/29/2023 Time of Session: 9:30 AM  Travis [name] MRN: [id number]  Principal Diagnosis: Major depressive disorder, recurrent episode, severe (HCC)  Secondary Diagnoses: Principal Problem:   Major depressive disorder, recurrent episode, severe (HCC) Active Problems:   Methamphetamine abuse (HCC)   Mild tetrahydrocannabinol (THC) abuse   Current Medications:  Current Facility-Administered Medications  Medication Dose Route Frequency Provider Last Rate Last Admin   acetaminophen (TYLENOL) tablet 650 mg  650 mg Oral Q6H PRN Maryagnes Amos, FNP   650 mg at 01/27/23 1653   alum & mag hydroxide-simeth (MAALOX/MYLANTA) 200-200-20 MG/5ML suspension 30 mL  30 mL Oral Q4H PRN Maryagnes Amos, FNP       amitriptyline (ELAVIL) tablet 25 mg  25 mg Oral QHS Sarina Ill, DO   25 mg at 01/28/23 2054   cloNIDine (CATAPRES) tablet 0.1 mg  0.1 mg Oral BID Sarina Ill, DO   0.1 mg at 01/28/23 2054   cyclobenzaprine (FLEXERIL) tablet 10 mg  10 mg Oral TID PRN Sarina Ill, DO   10 mg at 01/28/23 0850   escitalopram (LEXAPRO) tablet 10 mg  10 mg Oral QPC breakfast Sarina Ill, DO   10 mg at 01/29/23 0908   famotidine (PEPCID) tablet 20 mg  20 mg Oral Daily PRN Maryagnes Amos, FNP   20 mg at 01/24/23 0804   gabapentin (NEURONTIN) capsule 1,200 mg  1,200 mg Oral TID Maryagnes Amos, FNP   1,200 mg at 01/29/23 0908   loperamide (IMODIUM) capsule 4 mg  4 mg Oral PRN Sarina Ill, DO   4 mg at 01/26/23 0810   LORazepam (ATIVAN) tablet 0.5 mg  0.5 mg Oral Q6H PRN Lewanda Rife, MD       losartan (COZAAR) tablet 50 mg  50 mg Oral QPC breakfast Sarina Ill, DO   50 mg at 01/29/23 4010   magnesium hydroxide (MILK OF MAGNESIA) suspension 30 mL  30 mL Oral Daily PRN Maryagnes Amos, FNP       metFORMIN (GLUCOPHAGE-XR) 24 hr tablet 500 mg  500 mg Oral Q  breakfast Maryagnes Amos, FNP   500 mg at 01/29/23 0908   metoprolol succinate (TOPROL-XL) 24 hr tablet 50 mg  50 mg Oral Daily Sarina Ill, DO   50 mg at 01/28/23 0850   nicotine (NICODERM CQ - dosed in mg/24 hours) patch 14 mg  14 mg Transdermal Daily Sarina Ill, DO   14 mg at 01/29/23 0910   OLANZapine (ZYPREXA) tablet 10 mg  10 mg Oral Q6H PRN Sarina Ill, DO       pantoprazole (PROTONIX) EC tablet 40 mg  40 mg Oral Daily Maryagnes Amos, FNP   40 mg at 01/29/23 2725   QUEtiapine (SEROQUEL) tablet 300 mg  300 mg Oral QHS Sarina Ill, DO   300 mg at 01/28/23 2054   traMADol (ULTRAM) tablet 50 mg  50 mg Oral Q12H PRN Lewanda Rife, MD   50 mg at 01/29/23 1201   PTA Medications: Medications Prior to Admission  Medication Sig Dispense Refill Last Dose   acetaminophen (TYLENOL) 500 MG tablet Take 1,000 mg by mouth every 6 (six) hours as needed for mild pain or moderate pain.      CLARITIN 10 MG tablet Take 10 mg by mouth daily as needed for allergies.      cyclobenzaprine (FLEXERIL) 10 MG tablet Take 10 mg by  mouth 2 (two) times daily.      famotidine (PEPCID) 20 MG tablet Take 20 mg by mouth daily as needed for heartburn or indigestion. (Patient not taking: Reported on 01/20/2023)      gabapentin (NEURONTIN) 600 MG tablet Take 1,200 mg by mouth 3 (three) times daily.      lisinopril (ZESTRIL) 20 MG tablet Take 20 mg by mouth daily.      metFORMIN (GLUCOPHAGE-XR) 500 MG 24 hr tablet Take 500 mg by mouth daily with breakfast. (Patient not taking: Reported on 01/20/2023)      metoprolol tartrate (LOPRESSOR) 100 MG tablet Take 100 mg by mouth 2 (two) times daily.      pantoprazole (PROTONIX) 40 MG tablet Take 1 tablet (40 mg total) by mouth daily. (Patient not taking: Reported on 01/20/2023) 30 tablet 0    QUEtiapine (SEROQUEL) 200 MG tablet Take 300 mg by mouth at bedtime.      sertraline (ZOLOFT) 25 MG tablet Take 1 tablet (25 mg  total) by mouth daily. 14 tablet 0     Patient Stressors: Financial difficulties   Marital or family conflict   Substance abuse    Patient Strengths: Active sense of humor   Treatment Modalities: Medication Management, Group therapy, Case management,  1 to 1 session with clinician, Psychoeducation, Recreational therapy.   Physician Treatment Plan for Primary Diagnosis: Major depressive disorder, recurrent episode, severe (HCC) Long Term Goal(s): Improvement in symptoms so as ready for discharge   Short Term Goals: Ability to identify changes in lifestyle to reduce recurrence of condition will improve Ability to verbalize feelings will improve Ability to disclose and discuss suicidal ideas Ability to demonstrate self-control will improve Ability to identify and develop effective coping behaviors will improve Ability to maintain clinical measurements within normal limits will improve Compliance with prescribed medications will improve Ability to identify triggers associated with substance abuse/mental health issues will improve  Medication Management: Evaluate patient's response, side effects, and tolerance of medication regimen.  Therapeutic Interventions: 1 to 1 sessions, Unit Group sessions and Medication administration.  Evaluation of Outcomes: Progressing  Physician Treatment Plan for Secondary Diagnosis: Principal Problem:   Major depressive disorder, recurrent episode, severe (HCC) Active Problems:   Methamphetamine abuse (HCC)   Mild tetrahydrocannabinol (THC) abuse  Long Term Goal(s): Improvement in symptoms so as ready for discharge   Short Term Goals: Ability to identify changes in lifestyle to reduce recurrence of condition will improve Ability to verbalize feelings will improve Ability to disclose and discuss suicidal ideas Ability to demonstrate self-control will improve Ability to identify and develop effective coping behaviors will improve Ability to maintain  clinical measurements within normal limits will improve Compliance with prescribed medications will improve Ability to identify triggers associated with substance abuse/mental health issues will improve     Medication Management: Evaluate patient's response, side effects, and tolerance of medication regimen.  Therapeutic Interventions: 1 to 1 sessions, Unit Group sessions and Medication administration.  Evaluation of Outcomes: Progressing   RN Treatment Plan for Primary Diagnosis: Major depressive disorder, recurrent episode, severe (HCC) Long Term Goal(s): Knowledge of disease and therapeutic regimen to maintain health will improve  Short Term Goals: Ability to remain free from injury will improve, Ability to verbalize frustration and anger appropriately will improve, Ability to demonstrate self-control, Ability to participate in decision making will improve, Ability to verbalize feelings will improve, Ability to disclose and discuss suicidal ideas, Ability to identify and develop effective coping behaviors will improve, and Compliance with  prescribed medications will improve  Medication Management: RN will administer medications as ordered by provider, will assess and evaluate patient's response and provide education to patient for prescribed medication. RN will report any adverse and/or side effects to prescribing provider.  Therapeutic Interventions: 1 on 1 counseling sessions, Psychoeducation, Medication administration, Evaluate responses to treatment, Monitor vital signs and CBGs as ordered, Perform/monitor CIWA, COWS, AIMS and Fall Risk screenings as ordered, Perform wound care treatments as ordered.  Evaluation of Outcomes: Progressing   LCSW Treatment Plan for Primary Diagnosis: Major depressive disorder, recurrent episode, severe (HCC) Long Term Goal(s): Safe transition to appropriate next level of care at discharge, Engage patient in therapeutic group addressing interpersonal  concerns.  Short Term Goals: Engage patient in aftercare planning with referrals and resources, Increase social support, Increase ability to appropriately verbalize feelings, Increase emotional regulation, Facilitate acceptance of mental health diagnosis and concerns, Facilitate patient progression through stages of change regarding substance use diagnoses and concerns, Identify triggers associated with mental health/substance abuse issues, and Increase skills for wellness and recovery  Therapeutic Interventions: Assess for all discharge needs, 1 to 1 time with Social worker, Explore available resources and support systems, Assess for adequacy in community support network, Educate family and significant other(s) on suicide prevention, Complete Psychosocial Assessment, Interpersonal group therapy.  Evaluation of Outcomes: Progressing   Progress in Treatment: Attending groups: Yes. and No. Participating in groups: Yes. and No. Taking medication as prescribed: Yes. Toleration medication: Yes. Family/Significant other contact made: No, will contact:  CSW attempted to contact Victorino Dike [employer]  Patient understands diagnosis: Yes. Discussing patient identified problems/goals with staff: Yes. Medical problems stabilized or resolved: Yes. Denies suicidal/homicidal ideation: Yes. Issues/concerns per patient self-inventory: No. Other: None  New problem(s) identified: No, Describe:  None identified  Update 01/29/23: No changes at this time   New Short Term/Long Term Goal(s): detox, elimination of symptoms of psychosis, medication management for mood stabilization; elimination of SI thoughts; development of comprehensive mental wellness/sobriety plan. Update 01/29/23: No changes at this time    Patient Goals:  "I want to feel like I'm not going to run out and go into traffic" Update 01/29/23: No changes at this time     Discharge Plan or Barriers: CSW will assist with appropriate discharge planning  Update 01/29/23: No changes at this time     Reason for Continuation of Hospitalization: Depression Medication stabilization Suicidal ideation   Estimated Length of Stay: 1 to 7 days Update 01/29/23: TBD  Last 3 [place] Suicide Severity Risk Score: Flowsheet Row Admission (Current) from 01/23/2023 in [facility] Most recent reading at 01/23/2023  3:00 AM ED from 01/21/2023 in [facility] Emergency Department at [facility] Most recent reading at 01/21/2023 10:49 PM ED from 01/21/2023 in [facility] Emergency Department at [facility] Most recent reading at 01/21/2023  4:52 PM  C-SSRS RISK CATEGORY High Risk High Risk No Risk       Last PHQ 2/9 Scores:    01/22/2023   12:34 AM 08/26/2014    4:11 PM 04/07/2014   11:30 AM  Depression screen PHQ 2/9  Decreased Interest 2 2 1   Down, Depressed, Hopeless 2 2 3   PHQ - 2 Score 4 4 4   Altered sleeping 3 3 3   Tired, decreased energy 3 3 2   Change in appetite 2 2 2   Feeling bad or failure about yourself  3 3 0  Trouble concentrating 3 3 1   Moving slowly or fidgety/restless 0 0  0  Suicidal thoughts 1 1 0  PHQ-9 Score 19 19 12   Difficult doing work/chores Very difficult      Scribe for Treatment Team: Elza Rafter, Theresia Majors 01/29/2023 2:13 PM

## 2023-01-29 NOTE — Group Note (Signed)
Recreation Therapy Group Note   Group Topic:Emotion Expression  Group Date: 01/29/2023 Start Time: 1345 End Time: 1445 Facilitators: Rosina Lowenstein, LRT, CTRS Location:  Dayroom  Group Description: Gratitude Journaling. Patients and LRT discussed what gratitude means, how we can express it and what it means to [place], personally. LRT gave an education handout on the definition of gratitude that also gave different examples of gratitude exercises that they could try. One of the examples was "Gratitude Letter", which prompted patient to write a letter to someone they appreciate. LRT played soft music while everyone wrote their letter. Once letter was completed, LRT encouraged people to read their letter if they wanted to; or share who they wrote it to, at minimum. LRT and pts talked about the benefits of journaling and how it can be used as a positive coping skill. LRT and pts processed how showing gratitude towards themselves, and others can be applied to everyday life post-discharge. LRT offered journals to pts afterwards.   Goal Area(s) Addressed:  Patient will identify the definition of gratitude. Patient will learn different gratitude exercises. Patient will practice writing/journaling as a coping skill.  Patient will identify a new coping skill.   Affect/Mood: Appropriate   Participation Level: Active and Engaged   Participation Quality: Independent   Behavior: Appropriate, Calm, and Cooperative   Speech/Thought Process: Coherent   Insight: Good   Judgement: Good   Modes of Intervention: Activity, Education, and Writing   Patient Response to Interventions:  Attentive, Engaged, Interested , and Requested additional information/resources    Education Outcome:  Acknowledges education   Clinical Observations/Individualized Feedback: Travis [name] was active in their participation of session activities and group discussion. Pt identified "I wrote to my mom". Pt chose to read his letter  out loud for the group to hear. Pt shared that he is familiar with journaling and has done so before. Pt received a journal. Pt interacted well with LRT and peers duration of session.    Plan: Continue to engage patient in RT group sessions 2-3x/week.   Rosina Lowenstein, LRT, CTRS 01/29/2023 3:02 PM

## 2023-01-29 NOTE — Progress Notes (Signed)
Patient observed sitting in the dayroom watching tv and interacting appropriately with peers and staff at shift change. Patient denies SI/HI and pain during shift assessment. Patient reports AH of voices of "telling me to kill myself" but patient denies actions of harm while hospitalized and contracts for safety. Patient med compliant, routine observations to continue to monitor patient safety. No acute distress noted.

## 2023-01-29 NOTE — Group Note (Unsigned)
Date:  01/29/2023 Time:  11:04 AM  Group Topic/Focus:  Socialize /  Activity     Participation Level:  {BHH PARTICIPATION VWUJW:11914}  Participation Quality:  {BHH PARTICIPATION QUALITY:22265}  Affect:  {BHH AFFECT:22266}  Cognitive:  {BHH COGNITIVE:22267}  Insight: {BHH Insight2:20797}  Engagement in Group:  {BHH ENGAGEMENT IN NWGNF:62130}  Modes of Intervention:  {BHH MODES OF INTERVENTION:22269}  Additional Comments:  ***  [name] 01/29/2023, 11:04 AM

## 2023-01-29 NOTE — BHH Counselor (Signed)
CSW gave pt number for [facility] DSS per their request.   Reynaldo Minium, MSW, [facility] 01/29/2023 2:12 PM

## 2023-01-29 NOTE — Progress Notes (Signed)
D: Pt alert and oriented. Pt reports experiencing anxiety however denies experiencing any depression at this time. Pt denies experiencing any pain at this time. Pt denies experiencing any SI/HI, or AVH at this time.   A: Scheduled medications administered to pt, per MD orders. Support and encouragement provided. Frequent verbal contact made. Routine safety checks conducted q15 minutes.   R: No adverse drug reactions noted. Pt verbally contracts for safety at this time. Pt compliant with medications and treatment plan. Pt interacts well with others on the unit. Pt remains safe at this time. Plan of care ongoing.

## 2023-01-30 DIAGNOSIS — F1210 Cannabis abuse, uncomplicated: Secondary | ICD-10-CM | POA: Diagnosis not present

## 2023-01-30 DIAGNOSIS — F332 Major depressive disorder, recurrent severe without psychotic features: Secondary | ICD-10-CM | POA: Diagnosis not present

## 2023-01-30 NOTE — Group Note (Signed)
Date:  01/30/2023 Time:  9:39 PM  Group Topic/Focus:  Self Care:   The focus of this group is to help patients understand the importance of self-care in order to improve or restore emotional, physical, spiritual, interpersonal, and financial health.    Participation Level:  Active  Participation Quality:  Appropriate  Affect:  Appropriate  Cognitive:  Appropriate  Insight: Appropriate  Engagement in Group:  Engaged  Modes of Intervention:  Education  Additional Comments:    Garry Heater 01/30/2023, 9:39 PM

## 2023-01-30 NOTE — Progress Notes (Signed)
   01/30/23 0712  Psych Admission Type (Psych Patients Only)  Admission Status Voluntary  Psychosocial Assessment  Patient Complaints Anxiety  Eye Contact Brief  Facial Expression Flat  Affect Anxious  Speech Logical/coherent  Interaction Needy  Motor Activity Slow  Appearance/Hygiene In scrubs  Behavior Characteristics Cooperative;Anxious  Mood Anxious  Thought Process  Coherency WDL  Content WDL  Delusions None reported or observed  Perception WDL  Hallucination None reported or observed  Judgment WDL  Confusion None  Danger to Self  Current suicidal ideation? Denies  Danger to Others  Danger to Others None reported or observed

## 2023-01-30 NOTE — Progress Notes (Signed)
   01/30/23 0600  15 Minute Checks  Location Bedroom  Visual Appearance Calm  Behavior Sleeping  Sleep (Behavioral Health Patients Only)  Calculate sleep? (Click Yes once per 24 hr at 0600 safety check) Yes  Documented sleep last 24 hours 9.25

## 2023-01-30 NOTE — Plan of Care (Signed)
  Problem: Education: Goal: Knowledge of [facility] Education information/materials will improve Outcome: Progressing Goal: Emotional status will improve Outcome: Progressing Goal: Mental status will improve Outcome: Progressing Goal: Verbalization of understanding the information provided will improve Outcome: Progressing   Problem: Activity: Goal: Interest or engagement in activities will improve Outcome: Progressing Goal: Sleeping patterns will improve Outcome: Progressing   Problem: Coping: Goal: Ability to verbalize frustrations and anger appropriately will improve Outcome: Progressing Goal: Ability to demonstrate self-control will improve Outcome: Progressing   Problem: Health Behavior/Discharge Planning: Goal: Identification of resources available to assist in meeting health care needs will improve Outcome: Progressing Goal: Compliance with treatment plan for underlying cause of condition will improve Outcome: Progressing   Problem: Physical Regulation: Goal: Ability to maintain clinical measurements within normal limits will improve Outcome: Progressing   Problem: Safety: Goal: Periods of time without injury will increase Outcome: Progressing   Problem: Education: Goal: Utilization of techniques to improve thought processes will improve Outcome: Progressing Goal: Knowledge of the prescribed therapeutic regimen will improve Outcome: Progressing   Problem: Activity: Goal: Interest or engagement in leisure activities will improve Outcome: Progressing Goal: Imbalance in normal sleep/wake cycle will improve Outcome: Progressing   Problem: Coping: Goal: Coping ability will improve Outcome: Progressing Goal: Will verbalize feelings Outcome: Progressing   Problem: Health Behavior/Discharge Planning: Goal: Ability to make decisions will improve Outcome: Progressing Goal: Compliance with therapeutic regimen will improve Outcome: Progressing    Problem: Role Relationship: Goal: Will demonstrate positive changes in social behaviors and relationships Outcome: Progressing   Problem: Safety: Goal: Ability to disclose and discuss suicidal ideas will improve Outcome: Progressing Goal: Ability to identify and utilize support systems that promote safety will improve Outcome: Progressing   Problem: Self-Concept: Goal: Will verbalize positive feelings about self Outcome: Progressing Goal: Level of anxiety will decrease Outcome: Progressing   Problem: Education: Goal: Ability to incorporate positive changes in behavior to improve self-esteem will improve Outcome: Progressing   Problem: Health Behavior/Discharge Planning: Goal: Ability to identify and utilize available resources and services will improve Outcome: Progressing Goal: Ability to remain free from injury will improve Outcome: Progressing   Problem: Self-Concept: Goal: Will verbalize positive feelings about self Outcome: Progressing   Problem: Skin Integrity: Goal: Demonstration of wound healing without infection will improve Outcome: Progressing

## 2023-01-30 NOTE — Progress Notes (Signed)
[facility] MD Progress Note  01/30/2023  Travis [name]  MRN:  [id number]  Travis [name] is a 57 year old white male who was voluntarily admitted to inpatient psychiatry on transfer from [facility]. He endorses anhedonia, difficulty sleeping, depressed mood and suicidal ideation.   Subjective: Chart reviewed, case discussed in multidisciplinary meeting today, patient seen during rounds.  Social worker informed that patient has been given a list of rehab facilities.  Patient was encouraged to call the facilities to secure a bed.  Patient reports he is doing "okay" today.  He still has thoughts of harming himself. denies any intention to harm himself on the unit.  Patient was encouraged to attend group and work on coping strategies.   Principal Problem: Major depressive disorder, recurrent episode, severe (HCC) Diagnosis: Principal Problem:   Major depressive disorder, recurrent episode, severe (HCC) Active Problems:   Methamphetamine abuse (HCC)   Mild tetrahydrocannabinol (THC) abuse    Past Medical History:  Past Medical History:  Diagnosis Date   Anxiety    Panic attacks   Chest pain    Hospital, March, 2014, negative enzymes, patient refused in-hospital  stress test, patient canceled outpatient stress test   Constipation    Degenerative disk disease    Degenerative disk disease    Depression    GERD (gastroesophageal reflux disease)    Hypertension    Incontinence of urine    Neuromuscular disorder (HCC)    Obesity    Polysubstance abuse (HCC)    PTSD (post-traumatic stress disorder)    Pulmonary emboli (HCC)    Seizures (HCC)    Shortness of breath dyspnea    with exertion   Spinal stenosis    Spinal stenosis    Suicide attempt (HCC)    Tachycardia - pulse    Tobacco abuse     Past Surgical History:  Procedure Laterality Date   COLONOSCOPY N/A 08/24/2014   Procedure: COLONOSCOPY;  Surgeon: Charlott Rakes, MD;  Location: [facility] [place] ENDOSCOPY;  Service: Endoscopy;  Laterality: N/A;    ESOPHAGOGASTRODUODENOSCOPY (EGD) WITH PROPOFOL N/A 08/24/2014   Procedure: ESOPHAGOGASTRODUODENOSCOPY (EGD) WITH PROPOFOL;  Surgeon: Charlott Rakes, MD;  Location: [facility] ENDOSCOPY;  Service: Endoscopy;  Laterality: N/A;   WISDOM TOOTH EXTRACTION     Family History:  Family History  Problem Relation Age of Onset   Stroke Mother    Hypertension Mother    Hyperlipidemia Mother    Stroke Father    Hypertension Father    Dementia Father    Diabetes Father    Heart disease Father    Hyperlipidemia Father    Cancer Sister        brain   Diabetes Sister    Heart disease Sister    Hyperlipidemia Sister    Hypertension Sister    Stroke Sister    Heart disease Brother    Hyperlipidemia Brother     Social History:  Social History   Substance and Sexual Activity  Alcohol Use No     Social History   Substance and Sexual Activity  Drug Use Not Currently   Types: Amphetamines, Marijuana, Benzodiazepines    Social History   Socioeconomic History   Marital status: Divorced    Spouse name: Not on file   Number of children: Not on file   Years of education: Not on file   Highest education level: Not on file  Occupational History   Not on file  Tobacco Use   Smoking status: Every Day    Current packs/day: 0.00  Types: Cigarettes    Start date: 12/12/2016    Last attempt to quit: 12/12/2016    Years since quitting: 6.1   Smokeless tobacco: Never  Vaping Use   Vaping status: Every Day   Substances: Nicotine  Substance and Sexual Activity   Alcohol use: No   Drug use: Not Currently    Types: Amphetamines, Marijuana, Benzodiazepines   Sexual activity: Never  Other Topics Concern   Not on file  Social History Narrative   Not on file   Social Determinants of Health   Financial Resource Strain: High Risk (01/09/2023)   Received from [facility]   Overall Financial Resource Strain (CARDIA)    Difficulty of Paying Living Expenses: Hard  Food Insecurity: Food Insecurity  Present (01/23/2023)   Hunger Vital Sign    Worried About Running Out of Food in the Last Year: Sometimes true    Ran Out of Food in the Last Year: Sometimes true  Transportation Needs: Unmet Transportation Needs (01/23/2023)   PRAPARE - Administrator, Civil Service (Medical): Yes    Lack of Transportation (Non-Medical): Yes  Physical Activity: Inactive (01/09/2023)   Received from [facility]   Exercise Vital Sign    Days of Exercise per Week: 0 days    Minutes of Exercise per Session: 10 min  Stress: Stress Concern Present (01/09/2023)   Received from [facility] - Occupational Stress Questionnaire    Feeling of Stress : Rather much  Social Connections: Moderately Integrated (01/09/2023)   Received from [facility]   Social Network    How would you rate your social network (family, work, friends)?: Adequate participation with social networks   Additional Social History:     Patient is homeless                    Sleep: Fair  Appetite:  Fair  Current Medications: Current Facility-Administered Medications  Medication Dose Route Frequency Provider Last Rate Last Admin   acetaminophen (TYLENOL) tablet 650 mg  650 mg Oral Q6H PRN Maryagnes Amos, FNP   650 mg at 01/30/23 1414   alum & mag hydroxide-simeth (MAALOX/MYLANTA) 200-200-20 MG/5ML suspension 30 mL  30 mL Oral Q4H PRN Maryagnes Amos, FNP       amitriptyline (ELAVIL) tablet 25 mg  25 mg Oral QHS Sarina Ill, DO   25 mg at 01/29/23 2110   cloNIDine (CATAPRES) tablet 0.1 mg  0.1 mg Oral BID Sarina Ill, DO   0.1 mg at 01/30/23 0915   cyclobenzaprine (FLEXERIL) tablet 10 mg  10 mg Oral TID PRN Sarina Ill, DO   10 mg at 01/30/23 1935   escitalopram (LEXAPRO) tablet 10 mg  10 mg Oral QPC breakfast Sarina Ill, DO   10 mg at 01/30/23 0916   famotidine (PEPCID) tablet 20 mg  20 mg Oral Daily PRN  Maryagnes Amos, FNP   20 mg at 01/24/23 0804   gabapentin (NEURONTIN) capsule 900 mg  900 mg Oral TID Lewanda Rife, MD   900 mg at 01/30/23 1559   loperamide (IMODIUM) capsule 4 mg  4 mg Oral PRN Sarina Ill, DO   4 mg at 01/26/23 0810   LORazepam (ATIVAN) tablet 0.5 mg  0.5 mg Oral Q6H PRN Lewanda Rife, MD   0.5 mg at 01/30/23 1649   losartan (COZAAR) tablet 50 mg  50 mg Oral QPC breakfast Sarina Ill,  DO   50 mg at 01/30/23 0914   magnesium hydroxide (MILK OF MAGNESIA) suspension 30 mL  30 mL Oral Daily PRN Maryagnes Amos, FNP       metFORMIN (GLUCOPHAGE-XR) 24 hr tablet 500 mg  500 mg Oral Q breakfast Maryagnes Amos, FNP   500 mg at 01/29/23 0908   metoprolol succinate (TOPROL-XL) 24 hr tablet 50 mg  50 mg Oral Daily Sarina Ill, DO   50 mg at 01/30/23 0915   nicotine (NICODERM CQ - dosed in mg/24 hours) patch 14 mg  14 mg Transdermal Daily Sarina Ill, DO   14 mg at 01/30/23 0920   OLANZapine (ZYPREXA) tablet 10 mg  10 mg Oral Q6H PRN Sarina Ill, DO       pantoprazole (PROTONIX) EC tablet 40 mg  40 mg Oral Daily Maryagnes Amos, FNP   40 mg at 01/30/23 0915   QUEtiapine (SEROQUEL) tablet 300 mg  300 mg Oral QHS Sarina Ill, DO   300 mg at 01/29/23 2110   traMADol (ULTRAM) tablet 50 mg  50 mg Oral Q12H PRN Lewanda Rife, MD   50 mg at 01/30/23 1413    Lab Results: No results found for this or any previous visit (from the past 48 hour(s)).  Blood Alcohol level:  Lab Results  Component Value Date   ETH <10 01/20/2023   ETH <10 02/28/2018    Metabolic Disorder Labs: Lab Results  Component Value Date   HGBA1C 5.9 10/12/2013   MPG 123 (H) 05/29/2012   No results found for: "PROLACTIN" Lab Results  Component Value Date   CHOL 115 05/30/2012   TRIG 87 05/30/2012   HDL 25 (L) 05/30/2012   CHOLHDL 4.6 05/30/2012   VLDL 17 05/30/2012   LDLCALC 73 05/30/2012   LDLCALC  69 11/15/2008    Musculoskeletal: Strength & Muscle Tone: within normal limits Gait & Station:  WNL Patient leans: N/A  Psychiatric Specialty Exam:  Presentation  General Appearance:  Appropriate for Environment  Eye Contact: Good  Speech: Clear and Coherent; Normal Rate  Speech Volume: Normal  Handedness: Right   Mood and Affect  Mood: "Fine"  Affect: Constricted   Thought Process  Thought Processes: Coherent; Goal Directed  Descriptions of Associations:Intact  Orientation:Full (Time, Place and Person)  Thought Content:Logical; WDL  History of Schizophrenia/Schizoaffective disorder:No  Duration of Psychotic Symptoms:Greater than six months  Hallucinations:Denies Ideas of Reference:None  Suicidal Thoughts:Positive Homicidal Thoughts:Denies  Sensorium  Memory: Immediate Fair; Recent Good  Judgment: Intact  Insight: Limited   Executive Functions  Concentration: Good  Attention Span: Good  Recall: Good  Fund of Knowledge: Good  Language: Good   Psychomotor Activity  Psychomotor Activity:Decreased  Assets  Assets: Communication Skills; Desire for Improvement; Financial Resources/Insurance; Leisure Time; Physical Health   Sleep  Sleep:Fair   Physical Exam: Physical Exam HENT:     Head: Normocephalic and atraumatic.     Nose: Nose normal.  Eyes:     Pupils: Pupils are equal, round, and reactive to light.  Pulmonary:     Effort: Pulmonary effort is normal.     Breath sounds: Normal breath sounds.  Skin:    General: Skin is warm.  Neurological:     General: No focal deficit present.     Mental Status: He is alert and oriented to person, place, and time.    Review of Systems  Constitutional:  Negative for fever.  HENT:  Negative for hearing loss  and sore throat.   Respiratory:  Negative for cough and shortness of breath.   Cardiovascular:  Negative for chest pain and palpitations.  Gastrointestinal:  Negative  for heartburn, nausea and vomiting.  Neurological:  Negative for dizziness and speech change.  Psychiatric/Behavioral:  Positive for depression, substance abuse and suicidal ideas.    Blood pressure (!) 143/94, pulse 71, temperature 98.1 F (36.7 C), resp. rate 16, height 6' (1.829 m), weight 117 kg, SpO2 96%. Body mass index is 34.99 kg/m.   Treatment Plan Summary: Daily contact with patient to assess and evaluate symptoms and progress in treatment and Medication management  Continue on amitriptyline, Seroquel, Lexapro has started on admission.  01/29/23 Decreased the dose of gabapentin from 1200 mg 3 times daily to 900 mg 3 times daily Decrease the dose of Ativan to 0.5 mg as needed every 6 hours for anxiety Decrease dose of tramadol to 50 mg every 12 hours for pain  Lewanda Rife, MD

## 2023-01-31 DIAGNOSIS — F1210 Cannabis abuse, uncomplicated: Secondary | ICD-10-CM | POA: Diagnosis not present

## 2023-01-31 DIAGNOSIS — F332 Major depressive disorder, recurrent severe without psychotic features: Secondary | ICD-10-CM | POA: Diagnosis not present

## 2023-01-31 LAB — HEMOGLOBIN A1C
Hgb A1c MFr Bld: 5.8 % — ABNORMAL HIGH (ref 4.8–5.6)
Mean Plasma Glucose: 119.76 mg/dL

## 2023-01-31 NOTE — Progress Notes (Signed)
   01/31/23 0726  Psych Admission Type (Psych Patients Only)  Admission Status Voluntary  Psychosocial Assessment  Patient Complaints Anxiety  Eye Contact Brief  Facial Expression Flat  Affect Anxious  Speech Logical/coherent  Interaction Needy  Motor Activity Slow  Appearance/Hygiene In scrubs  Behavior Characteristics Calm  Mood Anxious  Thought Process  Coherency WDL  Content WDL  Delusions None reported or observed  Perception WDL  Hallucination None reported or observed  Judgment WDL  Confusion None  Danger to Self  Current suicidal ideation? Denies  Danger to Others  Danger to Others None reported or observed

## 2023-01-31 NOTE — Group Note (Signed)
Date:  01/31/2023 Time:  3:06 PM  Group Topic/Focus:  Overcoming Stress:   The focus of this group is to define stress and help patients assess their triggers.    Participation Level:  Active  Participation Quality:  Appropriate  Affect:  Appropriate  Cognitive:  Appropriate  Insight: Appropriate  Engagement in Group:  Engaged  Modes of Intervention:  Activity  Additional Comments:     Alexis Frock 01/31/2023, 3:06 PM

## 2023-01-31 NOTE — Progress Notes (Signed)
[facility] MD Progress Note  01/31/2023  Travis [name]  MRN:  [id number]  Travis [name] is a 57 year old white male who was voluntarily admitted to inpatient psychiatry on transfer from [facility]. He endorses anhedonia, difficulty sleeping, depressed mood and suicidal ideation.   Subjective: Chart reviewed, case discussed in multidisciplinary meeting today, patient seen during rounds.  Social worker informed that patient has been given a list of rehab facilities.  Patient reports that he had called few facilities from the list today.  Patient said 4 of them called patient back to let him know the decision.  Patient reports he is doing "okay" today.  He denies thoughts of harming himself. denies any intention to harm himself on the unit.  Patient was encouraged to attend group and work on coping strategies.   Principal Problem: Major depressive disorder, recurrent episode, severe (HCC) Diagnosis: Principal Problem:   Major depressive disorder, recurrent episode, severe (HCC) Active Problems:   Methamphetamine abuse (HCC)   Mild tetrahydrocannabinol (THC) abuse    Past Medical History:  Past Medical History:  Diagnosis Date   Anxiety    Panic attacks   Chest pain    Hospital, March, 2014, negative enzymes, patient refused in-hospital  stress test, patient canceled outpatient stress test   Constipation    Degenerative disk disease    Degenerative disk disease    Depression    GERD (gastroesophageal reflux disease)    Hypertension    Incontinence of urine    Neuromuscular disorder (HCC)    Obesity    Polysubstance abuse (HCC)    PTSD (post-traumatic stress disorder)    Pulmonary emboli (HCC)    Seizures (HCC)    Shortness of breath dyspnea    with exertion   Spinal stenosis    Spinal stenosis    Suicide attempt (HCC)    Tachycardia - pulse    Tobacco abuse     Past Surgical History:  Procedure Laterality Date   COLONOSCOPY N/A 08/24/2014   Procedure: COLONOSCOPY;  Surgeon: Charlott Rakes, MD;  Location: [facility] ENDOSCOPY;  Service: Endoscopy;  Laterality: N/A;   ESOPHAGOGASTRODUODENOSCOPY (EGD) WITH PROPOFOL N/A 08/24/2014   Procedure: ESOPHAGOGASTRODUODENOSCOPY (EGD) WITH PROPOFOL;  Surgeon: Charlott Rakes, MD;  Location: [facility] ENDOSCOPY;  Service: Endoscopy;  Laterality: N/A;   WISDOM TOOTH EXTRACTION     Family History:  Family History  Problem Relation Age of Onset   Stroke Mother    Hypertension Mother    Hyperlipidemia Mother    Stroke Father    Hypertension Father    Dementia Father    Diabetes Father    Heart disease Father    Hyperlipidemia Father    Cancer Sister        brain   Diabetes Sister    Heart disease Sister    Hyperlipidemia Sister    Hypertension Sister    Stroke Sister    Heart disease Brother    Hyperlipidemia Brother     Social History:  Social History   Substance and Sexual Activity  Alcohol Use No     Social History   Substance and Sexual Activity  Drug Use Not Currently   Types: Amphetamines, Marijuana, Benzodiazepines    Social History   Socioeconomic History   Marital status: Divorced    Spouse name: Not on file   Number of children: Not on file   Years of education: Not on file   Highest education level: Not on file  Occupational History   Not on file  Tobacco Use   Smoking status: Every Day    Current packs/day: 0.00    Types: Cigarettes    Start date: 12/12/2016    Last attempt to quit: 12/12/2016    Years since quitting: 6.1   Smokeless tobacco: Never  Vaping Use   Vaping status: Every Day   Substances: Nicotine  Substance and Sexual Activity   Alcohol use: No   Drug use: Not Currently    Types: Amphetamines, Marijuana, Benzodiazepines   Sexual activity: Never  Other Topics Concern   Not on file  Social History Narrative   Not on file   Social Determinants of Health   Financial Resource Strain: High Risk (01/09/2023)   Received from [facility]   Overall Financial Resource Strain (CARDIA)     Difficulty of Paying Living Expenses: Hard  Food Insecurity: Food Insecurity Present (01/23/2023)   Hunger Vital Sign    Worried About Running Out of Food in the Last Year: Sometimes true    Ran Out of Food in the Last Year: Sometimes true  Transportation Needs: Unmet Transportation Needs (01/23/2023)   PRAPARE - Administrator, Civil Service (Medical): Yes    Lack of Transportation (Non-Medical): Yes  Physical Activity: Inactive (01/09/2023)   Received from [facility]   Exercise Vital Sign    Days of Exercise per Week: 0 days    Minutes of Exercise per Session: 10 min  Stress: Stress Concern Present (01/09/2023)   Received from [facility] - Occupational Stress Questionnaire    Feeling of Stress : Rather much  Social Connections: Moderately Integrated (01/09/2023)   Received from [facility]   Social Network    How would you rate your social network (family, work, friends)?: Adequate participation with social networks   Additional Social History:     Patient is homeless                    Sleep: Fair  Appetite:  Fair  Current Medications: Current Facility-Administered Medications  Medication Dose Route Frequency Provider Last Rate Last Admin   acetaminophen (TYLENOL) tablet 650 mg  650 mg Oral Q6H PRN Maryagnes Amos, FNP   650 mg at 01/31/23 1215   alum & mag hydroxide-simeth (MAALOX/MYLANTA) 200-200-20 MG/5ML suspension 30 mL  30 mL Oral Q4H PRN Maryagnes Amos, FNP       amitriptyline (ELAVIL) tablet 25 mg  25 mg Oral QHS Sarina Ill, DO   25 mg at 01/30/23 2136   cloNIDine (CATAPRES) tablet 0.1 mg  0.1 mg Oral BID Sarina Ill, DO   0.1 mg at 01/31/23 4098   cyclobenzaprine (FLEXERIL) tablet 10 mg  10 mg Oral TID PRN Sarina Ill, DO   10 mg at 01/31/23 1215   escitalopram (LEXAPRO) tablet 10 mg  10 mg Oral QPC breakfast Sarina Ill, DO   10 mg  at 01/31/23 0906   famotidine (PEPCID) tablet 20 mg  20 mg Oral Daily PRN Maryagnes Amos, FNP   20 mg at 01/24/23 0804   gabapentin (NEURONTIN) capsule 900 mg  900 mg Oral TID Lewanda Rife, MD   900 mg at 01/31/23 1552   loperamide (IMODIUM) capsule 4 mg  4 mg Oral PRN Sarina Ill, DO   4 mg at 01/26/23 0810   LORazepam (ATIVAN) tablet 0.5 mg  0.5 mg Oral Q6H PRN Lewanda Rife, MD   0.5 mg at 01/31/23  1932   losartan (COZAAR) tablet 50 mg  50 mg Oral QPC breakfast Sarina Ill, DO   50 mg at 01/31/23 1610   magnesium hydroxide (MILK OF MAGNESIA) suspension 30 mL  30 mL Oral Daily PRN Maryagnes Amos, FNP       metFORMIN (GLUCOPHAGE-XR) 24 hr tablet 500 mg  500 mg Oral Q breakfast Maryagnes Amos, FNP   500 mg at 01/29/23 0908   metoprolol succinate (TOPROL-XL) 24 hr tablet 50 mg  50 mg Oral Daily Sarina Ill, DO   50 mg at 01/31/23 9604   nicotine (NICODERM CQ - dosed in mg/24 hours) patch 14 mg  14 mg Transdermal Daily Sarina Ill, DO   14 mg at 01/31/23 0909   OLANZapine (ZYPREXA) tablet 10 mg  10 mg Oral Q6H PRN Sarina Ill, DO       pantoprazole (PROTONIX) EC tablet 40 mg  40 mg Oral Daily Maryagnes Amos, FNP   40 mg at 01/31/23 5409   QUEtiapine (SEROQUEL) tablet 300 mg  300 mg Oral QHS Sarina Ill, DO   300 mg at 01/30/23 2140   traMADol (ULTRAM) tablet 50 mg  50 mg Oral Q12H PRN Lewanda Rife, MD   50 mg at 01/31/23 1552    Lab Results:  Results for orders placed or performed during the hospital encounter of 01/23/23 (from the past 48 hour(s))  Hemoglobin A1c     Status: Abnormal   Collection Time: 01/31/23  9:44 AM  Result Value Ref Range   Hgb A1c MFr Bld 5.8 (H) 4.8 - 5.6 %    Comment: (NOTE) Pre diabetes:          5.7%-6.4%  Diabetes:              >6.4%  Glycemic control for   <7.0% adults with diabetes    Mean Plasma Glucose 119.76 mg/dL    Comment: Performed at  [facility], [address] [place], [name] [address]    Blood Alcohol level:  Lab Results  Component Value Date   [facility] <10 01/20/2023   ETH <10 02/28/2018    Metabolic Disorder Labs: Lab Results  Component Value Date   HGBA1C 5.8 (H) 01/31/2023   MPG 119.76 01/31/2023   MPG 123 (H) 05/29/2012   No results found for: "PROLACTIN" Lab Results  Component Value Date   CHOL 115 05/30/2012   TRIG 87 05/30/2012   HDL 25 (L) 05/30/2012   CHOLHDL 4.6 05/30/2012   VLDL 17 05/30/2012   LDLCALC 73 05/30/2012   LDLCALC 69 11/15/2008    Musculoskeletal: Strength & Muscle Tone: within normal limits Gait & Station:  WNL Patient leans: N/A  Psychiatric Specialty Exam:  Presentation  General Appearance:  Appropriate for Environment  Eye Contact: Good  Speech: Clear and Coherent; Normal Rate  Speech Volume: Normal  Handedness: Right   Mood and Affect  Mood: "Fine"  Affect: Constricted   Thought Process  Thought Processes: Coherent; Goal Directed  Descriptions of Associations:Intact  Orientation:Full (Time, Place and Person)  Thought Content:Logical; WDL  History of Schizophrenia/Schizoaffective disorder:No  Duration of Psychotic Symptoms:Greater than six months  Hallucinations:Denies Ideas of Reference:None  Suicidal Thoughts:Denies Homicidal Thoughts:Denies  Sensorium  Memory: Immediate Fair; Recent Good  Judgment: Intact  Insight: Improving   Executive Functions  Concentration: Good  Attention Span: Good  Recall: Good  Fund of Knowledge: Good  Language: Good   Psychomotor Activity  Psychomotor Activity:Decreased  Assets  Assets: Manufacturing systems engineer; Desire for Improvement; Financial Resources/Insurance; Leisure Time; Physical Health   Sleep  Sleep:Fair   Physical Exam: Physical Exam HENT:     Head: Normocephalic and atraumatic.     Nose: Nose normal.  Eyes:     Pupils: Pupils are equal, round, and  reactive to light.  Pulmonary:     Effort: Pulmonary effort is normal.     Breath sounds: Normal breath sounds.  Skin:    General: Skin is warm.  Neurological:     General: No focal deficit present.     Mental Status: He is alert and oriented to person, place, and time.    Review of Systems  Constitutional:  Negative for fever.  HENT:  Negative for hearing loss and sore throat.   Respiratory:  Negative for cough and shortness of breath.   Cardiovascular:  Negative for chest pain and palpitations.  Gastrointestinal:  Negative for heartburn, nausea and vomiting.  Neurological:  Negative for dizziness and speech change.  Psychiatric/Behavioral:  Positive for depression, substance abuse and suicidal ideas.    Blood pressure (!) 145/104, pulse 73, temperature (!) 96.8 F (36 C), resp. rate 18, height 6' (1.829 m), weight 117 kg, SpO2 97%. Body mass index is 34.99 kg/m.   Treatment Plan Summary: Daily contact with patient to assess and evaluate symptoms and progress in treatment and Medication management  Continue on amitriptyline, Seroquel, Lexapro has started on admission.  Med changes on 01/29/23 Decreased the dose of gabapentin from 1200 mg 3 times daily to 900 mg 3 times daily Decrease the dose of Ativan to 0.5 mg as needed every 6 hours for anxiety Decrease dose of tramadol to 50 mg every 12 hours for pain  Lewanda Rife, MD

## 2023-01-31 NOTE — Plan of Care (Signed)
  Problem: Education: Goal: Knowledge of [facility] Education information/materials will improve Outcome: Progressing Goal: Emotional status will improve Outcome: Progressing Goal: Mental status will improve Outcome: Progressing Goal: Verbalization of understanding the information provided will improve Outcome: Progressing   Problem: Activity: Goal: Interest or engagement in activities will improve Outcome: Progressing Goal: Sleeping patterns will improve Outcome: Progressing   Problem: Coping: Goal: Ability to verbalize frustrations and anger appropriately will improve Outcome: Progressing Goal: Ability to demonstrate self-control will improve Outcome: Progressing   Problem: Health Behavior/Discharge Planning: Goal: Identification of resources available to assist in meeting health care needs will improve Outcome: Progressing Goal: Compliance with treatment plan for underlying cause of condition will improve Outcome: Progressing   Problem: Physical Regulation: Goal: Ability to maintain clinical measurements within normal limits will improve Outcome: Progressing   Problem: Safety: Goal: Periods of time without injury will increase Outcome: Progressing   Problem: Education: Goal: Utilization of techniques to improve thought processes will improve Outcome: Progressing Goal: Knowledge of the prescribed therapeutic regimen will improve Outcome: Progressing   Problem: Activity: Goal: Interest or engagement in leisure activities will improve Outcome: Progressing Goal: Imbalance in normal sleep/wake cycle will improve Outcome: Progressing   Problem: Coping: Goal: Coping ability will improve Outcome: Progressing Goal: Will verbalize feelings Outcome: Progressing   Problem: Health Behavior/Discharge Planning: Goal: Ability to make decisions will improve Outcome: Progressing Goal: Compliance with therapeutic regimen will improve Outcome: Progressing    Problem: Role Relationship: Goal: Will demonstrate positive changes in social behaviors and relationships Outcome: Progressing   Problem: Safety: Goal: Ability to disclose and discuss suicidal ideas will improve Outcome: Progressing Goal: Ability to identify and utilize support systems that promote safety will improve Outcome: Progressing   Problem: Self-Concept: Goal: Will verbalize positive feelings about self Outcome: Progressing Goal: Level of anxiety will decrease Outcome: Progressing   Problem: Education: Goal: Ability to incorporate positive changes in behavior to improve self-esteem will improve Outcome: Progressing   Problem: Health Behavior/Discharge Planning: Goal: Ability to identify and utilize available resources and services will improve Outcome: Progressing Goal: Ability to remain free from injury will improve Outcome: Progressing   Problem: Self-Concept: Goal: Will verbalize positive feelings about self Outcome: Progressing   Problem: Skin Integrity: Goal: Demonstration of wound healing without infection will improve Outcome: Progressing

## 2023-01-31 NOTE — Progress Notes (Addendum)
Patient observed sitting in the dayroom, watching TV and interacting with peers and staff appropriately.  1935-patient complains of increased back pain (10/10), Flexeril 10 mg PO given, patient reports no relief given.  2135-patient received PRN Ativan 0.5 mg PO for increased anxiety (10/10). Patient denies HI/AVH, and reports passive SI with no particular plan. Patient appears needy and disheveled. Patient BP slightly elevated: 143/94-patient received scheduled dose of Clonidine 0.1 mg, no acute distress noted. Patient ate a snack, and went to bed without incident. Routine observations to continue to monitor patient safety.

## 2023-01-31 NOTE — Group Note (Signed)
Date:  01/31/2023 Time:  8:38 PM  Group Topic/Focus:  Goals Group:   The focus of this group is to help patients establish daily goals to achieve during treatment and discuss how the patient can incorporate goal setting into their daily lives to aide in recovery.    Participation Level:  Active  Participation Quality:  Appropriate  Affect:  Appropriate  Cognitive:  Appropriate  Insight: Good  Engagement in Group:  Engaged  Modes of Intervention:  Discussion  Additional Comments:    Burt Ek 01/31/2023, 8:38 PM

## 2023-02-01 DIAGNOSIS — F1210 Cannabis abuse, uncomplicated: Secondary | ICD-10-CM | POA: Diagnosis not present

## 2023-02-01 DIAGNOSIS — F332 Major depressive disorder, recurrent severe without psychotic features: Secondary | ICD-10-CM | POA: Diagnosis not present

## 2023-02-01 NOTE — Plan of Care (Signed)
  Problem: Education: Goal: Knowledge of  General Education information/materials will improve Outcome: Progressing   Problem: Coping: Goal: Coping ability will improve Outcome: Not Progressing   Problem: Education: Goal: Ability to state activities that reduce stress will improve Outcome: Not Progressing

## 2023-02-01 NOTE — Group Note (Signed)
Date:  02/01/2023 Time:  2:47 PM  Group Topic/Focus:  Building Self Esteem:   The Focus of this group is helping patients become aware of the effects of self-esteem on their lives, the things they and others do that enhance or undermine their self-esteem, seeing the relationship between their level of self-esteem and the choices they make and learning ways to enhance self-esteem. Overcoming Stress:   The focus of this group is to define stress and help patients assess their triggers.    Participation Level:  Active  Participation Quality:  Appropriate, Attentive, Sharing, and Supportive  Affect:  Appropriate  Cognitive:  Alert, Appropriate, and Oriented  Insight: Appropriate and Good  Engagement in Group:  Engaged  Modes of Intervention:  Activity  Additional Comments:     Alexis Frock 02/01/2023, 2:47 PM

## 2023-02-01 NOTE — Group Note (Signed)
Date:  02/01/2023 Time:  8:13 PM  Group Topic/Focus:  Developing a Wellness Toolbox:   The focus of this group is to help patients develop a "wellness toolbox" with skills and strategies to promote recovery upon discharge.    Participation Level:  Active  Participation Quality:  Appropriate  Affect:  Appropriate  Cognitive:  Appropriate  Insight: Appropriate  Engagement in Group:  Engaged  Modes of Intervention:  Education  Additional Comments:    Garry Heater 02/01/2023, 8:13 PM

## 2023-02-01 NOTE — Plan of Care (Signed)
  Problem: Education: Goal: Knowledge of [facility] Education information/materials will improve Outcome: Progressing Goal: Emotional status will improve Outcome: Progressing Goal: Mental status will improve Outcome: Progressing Goal: Verbalization of understanding the information provided will improve Outcome: Progressing   Problem: Activity: Goal: Interest or engagement in activities will improve Outcome: Progressing Goal: Sleeping patterns will improve Outcome: Progressing   Problem: Coping: Goal: Ability to verbalize frustrations and anger appropriately will improve Outcome: Progressing Goal: Ability to demonstrate self-control will improve Outcome: Progressing   Problem: Health Behavior/Discharge Planning: Goal: Identification of resources available to assist in meeting health care needs will improve Outcome: Progressing Goal: Compliance with treatment plan for underlying cause of condition will improve Outcome: Progressing   Problem: Physical Regulation: Goal: Ability to maintain clinical measurements within normal limits will improve Outcome: Progressing   Problem: Safety: Goal: Periods of time without injury will increase Outcome: Progressing   Problem: Education: Goal: Utilization of techniques to improve thought processes will improve Outcome: Progressing Goal: Knowledge of the prescribed therapeutic regimen will improve Outcome: Progressing   Problem: Activity: Goal: Interest or engagement in leisure activities will improve Outcome: Progressing Goal: Imbalance in normal sleep/wake cycle will improve Outcome: Progressing   Problem: Coping: Goal: Coping ability will improve Outcome: Progressing Goal: Will verbalize feelings Outcome: Progressing   Problem: Health Behavior/Discharge Planning: Goal: Ability to make decisions will improve Outcome: Progressing Goal: Compliance with therapeutic regimen will improve Outcome: Progressing    Problem: Role Relationship: Goal: Will demonstrate positive changes in social behaviors and relationships Outcome: Progressing   Problem: Safety: Goal: Ability to disclose and discuss suicidal ideas will improve Outcome: Progressing Goal: Ability to identify and utilize support systems that promote safety will improve Outcome: Progressing   Problem: Self-Concept: Goal: Will verbalize positive feelings about self Outcome: Progressing Goal: Level of anxiety will decrease Outcome: Progressing   Problem: Education: Goal: Ability to incorporate positive changes in behavior to improve self-esteem will improve Outcome: Progressing   Problem: Health Behavior/Discharge Planning: Goal: Ability to identify and utilize available resources and services will improve Outcome: Progressing Goal: Ability to remain free from injury will improve Outcome: Progressing   Problem: Self-Concept: Goal: Will verbalize positive feelings about self Outcome: Progressing   Problem: Skin Integrity: Goal: Demonstration of wound healing without infection will improve Outcome: Progressing

## 2023-02-01 NOTE — Progress Notes (Addendum)
Patient admitted to [facility] on 11.21 after reports of overdosing on cannabis gummies while visiting his sister at her rehab facility. UDS + for amph, benzo, and cannabis. Patient also endorsed depression, SI, and poor sleep.  Patient denies SI/HI/AVH. He endorses anxiety and chronic 10/10 gen pain. Ativan 0.5 mg adm x 2 with effectiveness. Tramadol adm x1 with pain lessening from a 10 to a 5. Tylenol adm x 1 for 3/10 back pain at the end of this shift. Support and encouragement provided.  Q15 minute unit checks in place.

## 2023-02-01 NOTE — Progress Notes (Signed)
[facility] MD Progress Note  02/01/2023  Travis [name]  MRN:  [id number]  Travis [name] is a 57 year old white male who was voluntarily admitted to inpatient psychiatry on transfer from [facility]. He endorses anhedonia, difficulty sleeping, depressed mood and suicidal ideation.   Subjective: Chart reviewed, case discussed in multidisciplinary meeting today, patient seen during rounds.  Patient reports that he is doing little better today.  Patient said that one of the  rehab facility called him back, they may have bed by this upcoming Wednesday.  Patient feels hopeful.  He denies thoughts of harming himself.  Patient was encouraged to attend group and work on coping strategies.  We also discussed patient's hemoglobin A1c level which is at 5.8.  Patient was encouraged to continue to take metformin.   Principal Problem: Major depressive disorder, recurrent episode, severe (HCC) Diagnosis: Principal Problem:   Major depressive disorder, recurrent episode, severe (HCC) Active Problems:   Methamphetamine abuse (HCC)   Mild tetrahydrocannabinol (THC) abuse    Past Medical History:  Past Medical History:  Diagnosis Date   Anxiety    Panic attacks   Chest pain    Hospital, March, 2014, negative enzymes, patient refused in-hospital  stress test, patient canceled outpatient stress test   Constipation    Degenerative disk disease    Degenerative disk disease    Depression    GERD (gastroesophageal reflux disease)    Hypertension    Incontinence of urine    Neuromuscular disorder (HCC)    Obesity    Polysubstance abuse (HCC)    PTSD (post-traumatic stress disorder)    Pulmonary emboli (HCC)    Seizures (HCC)    Shortness of breath dyspnea    with exertion   Spinal stenosis    Spinal stenosis    Suicide attempt (HCC)    Tachycardia - pulse    Tobacco abuse     Past Surgical History:  Procedure Laterality Date   COLONOSCOPY N/A 08/24/2014   Procedure: COLONOSCOPY;  Surgeon: Charlott Rakes, MD;   Location: [facility] ENDOSCOPY;  Service: Endoscopy;  Laterality: N/A;   ESOPHAGOGASTRODUODENOSCOPY (EGD) WITH PROPOFOL N/A 08/24/2014   Procedure: ESOPHAGOGASTRODUODENOSCOPY (EGD) WITH PROPOFOL;  Surgeon: Charlott Rakes, MD;  Location: [facility] ENDOSCOPY;  Service: Endoscopy;  Laterality: N/A;   WISDOM TOOTH EXTRACTION     Family History:  Family History  Problem Relation Age of Onset   Stroke Mother    Hypertension Mother    Hyperlipidemia Mother    Stroke Father    Hypertension Father    Dementia Father    Diabetes Father    Heart disease Father    Hyperlipidemia Father    Cancer Sister        brain   Diabetes Sister    Heart disease Sister    Hyperlipidemia Sister    Hypertension Sister    Stroke Sister    Heart disease Brother    Hyperlipidemia Brother     Social History:  Social History   Substance and Sexual Activity  Alcohol Use No     Social History   Substance and Sexual Activity  Drug Use Not Currently   Types: Amphetamines, Marijuana, Benzodiazepines    Social History   Socioeconomic History   Marital status: Divorced    Spouse name: Not on file   Number of children: Not on file   Years of education: Not on file   Highest education level: Not on file  Occupational History   Not on file  Tobacco Use  Smoking status: Every Day    Current packs/day: 0.00    Types: Cigarettes    Start date: 12/12/2016    Last attempt to quit: 12/12/2016    Years since quitting: 6.1   Smokeless tobacco: Never  Vaping Use   Vaping status: Every Day   Substances: Nicotine  Substance and Sexual Activity   Alcohol use: No   Drug use: Not Currently    Types: Amphetamines, Marijuana, Benzodiazepines   Sexual activity: Never  Other Topics Concern   Not on file  Social History Narrative   Not on file   Social Determinants of Health   Financial Resource Strain: High Risk (01/09/2023)   Received from [facility]   Overall Financial Resource Strain (CARDIA)    Difficulty  of Paying Living Expenses: Hard  Food Insecurity: Food Insecurity Present (01/23/2023)   Hunger Vital Sign    Worried About Running Out of Food in the Last Year: Sometimes true    Ran Out of Food in the Last Year: Sometimes true  Transportation Needs: Unmet Transportation Needs (01/23/2023)   PRAPARE - Administrator, Civil Service (Medical): Yes    Lack of Transportation (Non-Medical): Yes  Physical Activity: Inactive (01/09/2023)   Received from [facility]   Exercise Vital Sign    Days of Exercise per Week: 0 days    Minutes of Exercise per Session: 10 min  Stress: Stress Concern Present (01/09/2023)   Received from [facility] - Occupational Stress Questionnaire    Feeling of Stress : Rather much  Social Connections: Moderately Integrated (01/09/2023)   Received from [facility]   Social Network    How would you rate your social network (family, work, friends)?: Adequate participation with social networks   Additional Social History:     Patient is homeless                    Sleep: Fair  Appetite:  Fair  Current Medications: Current Facility-Administered Medications  Medication Dose Route Frequency Provider Last Rate Last Admin   acetaminophen (TYLENOL) tablet 650 mg  650 mg Oral Q6H PRN Maryagnes Amos, FNP   650 mg at 02/01/23 1848   alum & mag hydroxide-simeth (MAALOX/MYLANTA) 200-200-20 MG/5ML suspension 30 mL  30 mL Oral Q4H PRN Maryagnes Amos, FNP       amitriptyline (ELAVIL) tablet 25 mg  25 mg Oral QHS Sarina Ill, DO   25 mg at 01/31/23 2136   cloNIDine (CATAPRES) tablet 0.1 mg  0.1 mg Oral BID Sarina Ill, DO   0.1 mg at 02/01/23 4696   cyclobenzaprine (FLEXERIL) tablet 10 mg  10 mg Oral TID PRN Sarina Ill, DO   10 mg at 01/31/23 1215   escitalopram (LEXAPRO) tablet 10 mg  10 mg Oral QPC breakfast Sarina Ill, DO   10 mg at 02/01/23  0807   famotidine (PEPCID) tablet 20 mg  20 mg Oral Daily PRN Maryagnes Amos, FNP   20 mg at 01/24/23 0804   gabapentin (NEURONTIN) capsule 900 mg  900 mg Oral TID Lewanda Rife, MD   900 mg at 02/01/23 1547   loperamide (IMODIUM) capsule 4 mg  4 mg Oral PRN Sarina Ill, DO   4 mg at 01/26/23 0810   LORazepam (ATIVAN) tablet 0.5 mg  0.5 mg Oral Q6H PRN Lewanda Rife, MD   0.5 mg at 02/01/23 1547   losartan (  COZAAR) tablet 50 mg  50 mg Oral QPC breakfast Sarina Ill, DO   50 mg at 02/01/23 8119   magnesium hydroxide (MILK OF MAGNESIA) suspension 30 mL  30 mL Oral Daily PRN Maryagnes Amos, FNP       metFORMIN (GLUCOPHAGE-XR) 24 hr tablet 500 mg  500 mg Oral Q breakfast Maryagnes Amos, FNP   500 mg at 02/01/23 1478   metoprolol succinate (TOPROL-XL) 24 hr tablet 50 mg  50 mg Oral Daily Sarina Ill, DO   50 mg at 02/01/23 2956   nicotine (NICODERM CQ - dosed in mg/24 hours) patch 14 mg  14 mg Transdermal Daily Sarina Ill, DO   14 mg at 02/01/23 0929   OLANZapine (ZYPREXA) tablet 10 mg  10 mg Oral Q6H PRN Sarina Ill, DO       pantoprazole (PROTONIX) EC tablet 40 mg  40 mg Oral Daily Maryagnes Amos, FNP   40 mg at 02/01/23 2130   QUEtiapine (SEROQUEL) tablet 300 mg  300 mg Oral QHS Sarina Ill, DO   300 mg at 01/31/23 2135   traMADol (ULTRAM) tablet 50 mg  50 mg Oral Q12H PRN Lewanda Rife, MD   50 mg at 02/01/23 1547    Lab Results:  Results for orders placed or performed during the hospital encounter of 01/23/23 (from the past 48 hour(s))  Hemoglobin A1c     Status: Abnormal   Collection Time: 01/31/23  9:44 AM  Result Value Ref Range   Hgb A1c MFr Bld 5.8 (H) 4.8 - 5.6 %    Comment: (NOTE) Pre diabetes:          5.7%-6.4%  Diabetes:              >6.4%  Glycemic control for   <7.0% adults with diabetes    Mean Plasma Glucose 119.76 mg/dL    Comment: Performed at [facility], [address] [place], [name] [address]    Blood Alcohol level:  Lab Results  Component Value Date   [facility] <10 01/20/2023   ETH <10 02/28/2018    Metabolic Disorder Labs: Lab Results  Component Value Date   HGBA1C 5.8 (H) 01/31/2023   MPG 119.76 01/31/2023   MPG 123 (H) 05/29/2012   No results found for: "PROLACTIN" Lab Results  Component Value Date   CHOL 115 05/30/2012   TRIG 87 05/30/2012   HDL 25 (L) 05/30/2012   CHOLHDL 4.6 05/30/2012   VLDL 17 05/30/2012   LDLCALC 73 05/30/2012   LDLCALC 69 11/15/2008    Musculoskeletal: Strength & Muscle Tone: within normal limits Gait & Station:  WNL Patient leans: N/A  Psychiatric Specialty Exam:  Presentation  General Appearance:  Appropriate for Environment  Eye Contact: Good  Speech: Clear and Coherent; Normal Rate  Speech Volume: Normal  Handedness: Right   Mood and Affect  Mood: "Better  Affect: Less Constricted   Thought Process  Thought Processes: Coherent; Goal Directed  Descriptions of Associations:Intact  Orientation:Full (Time, Place and Person)  Thought Content:Logical; WDL  History of Schizophrenia/Schizoaffective disorder:No  Duration of Psychotic Symptoms:Greater than six months  Hallucinations:Denies Ideas of Reference:None  Suicidal Thoughts:Denies Homicidal Thoughts:Denies  Sensorium  Memory: Immediate Fair; Recent Good  Judgment: Intact  Insight: Improving   Executive Functions  Concentration: Good  Attention Span: Good  Recall: Good  Fund of Knowledge: Good  Language: Good   Psychomotor Activity  Psychomotor Activity:Decreased  Assets  Assets:  Communication Skills; Desire for Improvement; Financial Resources/Insurance; Leisure Time; Physical Health   Sleep  Sleep:Fair   Physical Exam: Physical Exam HENT:     Head: Normocephalic and atraumatic.     Nose: Nose normal.  Eyes:     Pupils: Pupils are equal, round, and  reactive to light.  Pulmonary:     Effort: Pulmonary effort is normal.     Breath sounds: Normal breath sounds.  Skin:    General: Skin is warm.  Neurological:     General: No focal deficit present.     Mental Status: He is alert and oriented to person, place, and time.    Review of Systems  Constitutional:  Negative for fever.  HENT:  Negative for hearing loss and sore throat.   Respiratory:  Negative for cough and shortness of breath.   Cardiovascular:  Negative for chest pain and palpitations.  Gastrointestinal:  Negative for heartburn, nausea and vomiting.  Neurological:  Negative for dizziness and speech change.  Psychiatric/Behavioral:  Positive for depression, substance abuse and suicidal ideas.    Blood pressure (!) 158/102, pulse 71, temperature 98.2 F (36.8 C), resp. rate 18, height 6' (1.829 m), weight 117 kg, SpO2 94%. Body mass index is 34.99 kg/m.   Treatment Plan Summary: Daily contact with patient to assess and evaluate symptoms and progress in treatment and Medication management  Continue on amitriptyline, Seroquel, Lexapro has started on admission.  Med changes on 01/29/23 Decreased the dose of gabapentin from 1200 mg 3 times daily to 900 mg 3 times daily Decrease the dose of Ativan to 0.5 mg as needed every 6 hours for anxiety Decrease dose of tramadol to 50 mg every 12 hours for pain  Lewanda Rife, MD

## 2023-02-02 DIAGNOSIS — F332 Major depressive disorder, recurrent severe without psychotic features: Secondary | ICD-10-CM | POA: Diagnosis not present

## 2023-02-02 DIAGNOSIS — F1210 Cannabis abuse, uncomplicated: Secondary | ICD-10-CM | POA: Diagnosis not present

## 2023-02-02 NOTE — Group Note (Signed)
Date:  02/02/2023 Time:  3:19 PM  Group Topic/Focus:  Healthy Communication:   The focus of this group is to discuss communication, barriers to communication, as well as healthy ways to communicate with others. Movie therapy encourages emotional release Even those who often have trouble expressing their emotions might find themselves laughing or crying during a film. This release of emotions can have a cathartic effect and also make it easier for a person to become more comfortable in expressing their emotions.    Participation Level:  Active  Participation Quality:  Appropriate  Affect:  Appropriate  Cognitive:  Appropriate  Insight: Appropriate  Engagement in Group:  Engaged  Modes of Intervention:  Activity  Additional Comments:    Travis [name] 02/02/2023, 3:19 PM

## 2023-02-02 NOTE — Progress Notes (Signed)
   02/02/23 0200  Psych Admission Type (Psych Patients Only)  Admission Status Voluntary  Psychosocial Assessment  Patient Complaints Anxiety  Eye Contact Fair  Facial Expression Flat  Affect Anxious  Speech Logical/coherent  Interaction Assertive  Motor Activity Slow  Appearance/Hygiene In scrubs  Behavior Characteristics Anxious  Mood Anxious  Thought Process  Coherency WDL  Content WDL  Delusions None reported or observed  Perception WDL  Hallucination None reported or observed  Judgment WDL  Confusion None  Danger to Self  Current suicidal ideation? Denies  Agreement Not to Harm Self Yes  Description of Agreement verbal  Danger to Others  Danger to Others None reported or observed

## 2023-02-02 NOTE — Plan of Care (Signed)
  Problem: Education: Goal: Knowledge of [facility] Education information/materials will improve Outcome: Progressing Goal: Emotional status will improve Outcome: Progressing Goal: Mental status will improve Outcome: Progressing   

## 2023-02-02 NOTE — Progress Notes (Signed)
[facility] MD Progress Note  02/02/2023  Travis [name]  MRN:  [id number]  Travis [name] is a 57 year old white male who was voluntarily admitted to inpatient psychiatry on transfer from [facility]. He endorses anhedonia, difficulty sleeping, depressed mood and suicidal ideation.   Subjective: Chart reviewed, case discussed in meeting today, patient seen during rounds.  Patient reports that he is doing fine today.  Per patient report  one of the  rehab facility called him back, they may have bed by this upcoming Wednesday.  Patient was encouraged to discuss this with Child psychotherapist tomorrow.  Patient also reports that he might have fluid in his ear.  Patient was encouraged to let staff know if it gets worse,will consult ENT if patient continues to report ear pain.    Today he denies thoughts of harming himself.  Patient was encouraged to attend group and work on coping strategies.    Principal Problem: Major depressive disorder, recurrent episode, severe (HCC) Diagnosis: Principal Problem:   Major depressive disorder, recurrent episode, severe (HCC) Active Problems:   Methamphetamine abuse (HCC)   Mild tetrahydrocannabinol (THC) abuse    Past Medical History:  Past Medical History:  Diagnosis Date   Anxiety    Panic attacks   Chest pain    Hospital, March, 2014, negative enzymes, patient refused in-hospital  stress test, patient canceled outpatient stress test   Constipation    Degenerative disk disease    Degenerative disk disease    Depression    GERD (gastroesophageal reflux disease)    Hypertension    Incontinence of urine    Neuromuscular disorder (HCC)    Obesity    Polysubstance abuse (HCC)    PTSD (post-traumatic stress disorder)    Pulmonary emboli (HCC)    Seizures (HCC)    Shortness of breath dyspnea    with exertion   Spinal stenosis    Spinal stenosis    Suicide attempt (HCC)    Tachycardia - pulse    Tobacco abuse     Past Surgical History:  Procedure Laterality Date    COLONOSCOPY N/A 08/24/2014   Procedure: COLONOSCOPY;  Surgeon: Charlott Rakes, MD;  Location: [facility] ENDOSCOPY;  Service: Endoscopy;  Laterality: N/A;   ESOPHAGOGASTRODUODENOSCOPY (EGD) WITH PROPOFOL N/A 08/24/2014   Procedure: ESOPHAGOGASTRODUODENOSCOPY (EGD) WITH PROPOFOL;  Surgeon: Charlott Rakes, MD;  Location: [facility] ENDOSCOPY;  Service: Endoscopy;  Laterality: N/A;   WISDOM TOOTH EXTRACTION     Family History:  Family History  Problem Relation Age of Onset   Stroke Mother    Hypertension Mother    Hyperlipidemia Mother    Stroke Father    Hypertension Father    Dementia Father    Diabetes Father    Heart disease Father    Hyperlipidemia Father    Cancer Sister        brain   Diabetes Sister    Heart disease Sister    Hyperlipidemia Sister    Hypertension Sister    Stroke Sister    Heart disease Brother    Hyperlipidemia Brother     Social History:  Social History   Substance and Sexual Activity  Alcohol Use No     Social History   Substance and Sexual Activity  Drug Use Not Currently   Types: Amphetamines, Marijuana, Benzodiazepines    Social History   Socioeconomic History   Marital status: Divorced    Spouse name: Not on file   Number of children: Not on file   Years of education: Not  on file   Highest education level: Not on file  Occupational History   Not on file  Tobacco Use   Smoking status: Every Day    Current packs/day: 0.00    Types: Cigarettes    Start date: 12/12/2016    Last attempt to quit: 12/12/2016    Years since quitting: 6.1   Smokeless tobacco: Never  Vaping Use   Vaping status: Every Day   Substances: Nicotine  Substance and Sexual Activity   Alcohol use: No   Drug use: Not Currently    Types: Amphetamines, Marijuana, Benzodiazepines   Sexual activity: Never  Other Topics Concern   Not on file  Social History Narrative   Not on file   Social Determinants of Health   Financial Resource Strain: High Risk (01/09/2023)    Received from [facility]   Overall Financial Resource Strain (CARDIA)    Difficulty of Paying Living Expenses: Hard  Food Insecurity: Food Insecurity Present (01/23/2023)   Hunger Vital Sign    Worried About Running Out of Food in the Last Year: Sometimes true    Ran Out of Food in the Last Year: Sometimes true  Transportation Needs: Unmet Transportation Needs (01/23/2023)   PRAPARE - Administrator, Civil Service (Medical): Yes    Lack of Transportation (Non-Medical): Yes  Physical Activity: Inactive (01/09/2023)   Received from [facility]   Exercise Vital Sign    Days of Exercise per Week: 0 days    Minutes of Exercise per Session: 10 min  Stress: Stress Concern Present (01/09/2023)   Received from [facility] - Occupational Stress Questionnaire    Feeling of Stress : Rather much  Social Connections: Moderately Integrated (01/09/2023)   Received from [facility]   Social Network    How would you rate your social network (family, work, friends)?: Adequate participation with social networks   Additional Social History:     Patient is homeless                    Sleep: Fair  Appetite:  Fair  Current Medications: Current Facility-Administered Medications  Medication Dose Route Frequency Provider Last Rate Last Admin   acetaminophen (TYLENOL) tablet 650 mg  650 mg Oral Q6H PRN Maryagnes Amos, FNP   650 mg at 02/01/23 2136   alum & mag hydroxide-simeth (MAALOX/MYLANTA) 200-200-20 MG/5ML suspension 30 mL  30 mL Oral Q4H PRN Maryagnes Amos, FNP       amitriptyline (ELAVIL) tablet 25 mg  25 mg Oral QHS Sarina Ill, DO   25 mg at 02/01/23 2135   cloNIDine (CATAPRES) tablet 0.1 mg  0.1 mg Oral BID Sarina Ill, DO   0.1 mg at 02/02/23 1001   cyclobenzaprine (FLEXERIL) tablet 10 mg  10 mg Oral TID PRN Sarina Ill, DO   10 mg at 02/02/23 1000   escitalopram (LEXAPRO)  tablet 10 mg  10 mg Oral QPC breakfast Sarina Ill, DO   10 mg at 02/02/23 1000   famotidine (PEPCID) tablet 20 mg  20 mg Oral Daily PRN Maryagnes Amos, FNP   20 mg at 01/24/23 0804   gabapentin (NEURONTIN) capsule 900 mg  900 mg Oral TID Lewanda Rife, MD   900 mg at 02/02/23 1000   loperamide (IMODIUM) capsule 4 mg  4 mg Oral PRN Sarina Ill, DO   4 mg at 01/26/23 0810   LORazepam (  ATIVAN) tablet 0.5 mg  0.5 mg Oral Q6H PRN Lewanda Rife, MD   0.5 mg at 02/01/23 2137   losartan (COZAAR) tablet 50 mg  50 mg Oral QPC breakfast Sarina Ill, DO   50 mg at 02/02/23 1000   magnesium hydroxide (MILK OF MAGNESIA) suspension 30 mL  30 mL Oral Daily PRN Maryagnes Amos, FNP       metFORMIN (GLUCOPHAGE-XR) 24 hr tablet 500 mg  500 mg Oral Q breakfast Maryagnes Amos, FNP   500 mg at 02/02/23 8119   metoprolol succinate (TOPROL-XL) 24 hr tablet 50 mg  50 mg Oral Daily Sarina Ill, DO   50 mg at 02/02/23 1478   nicotine (NICODERM CQ - dosed in mg/24 hours) patch 14 mg  14 mg Transdermal Daily Sarina Ill, DO   14 mg at 02/02/23 1003   OLANZapine (ZYPREXA) tablet 10 mg  10 mg Oral Q6H PRN Sarina Ill, DO       pantoprazole (PROTONIX) EC tablet 40 mg  40 mg Oral Daily Maryagnes Amos, FNP   40 mg at 02/02/23 1001   QUEtiapine (SEROQUEL) tablet 300 mg  300 mg Oral QHS Sarina Ill, DO   300 mg at 02/01/23 2136   traMADol (ULTRAM) tablet 50 mg  50 mg Oral Q12H PRN Lewanda Rife, MD   50 mg at 02/02/23 1001    Lab Results:  No results found for this or any previous visit (from the past 48 hour(s)).   Blood Alcohol level:  Lab Results  Component Value Date   ETH <10 01/20/2023   ETH <10 02/28/2018    Metabolic Disorder Labs: Lab Results  Component Value Date   HGBA1C 5.8 (H) 01/31/2023   MPG 119.76 01/31/2023   MPG 123 (H) 05/29/2012   No results found for: "PROLACTIN" Lab  Results  Component Value Date   CHOL 115 05/30/2012   TRIG 87 05/30/2012   HDL 25 (L) 05/30/2012   CHOLHDL 4.6 05/30/2012   VLDL 17 05/30/2012   LDLCALC 73 05/30/2012   LDLCALC 69 11/15/2008    Musculoskeletal: Strength & Muscle Tone: within normal limits Gait & Station:  WNL Patient leans: N/A  Psychiatric Specialty Exam:  Presentation  General Appearance:  Appropriate for Environment  Eye Contact: Good  Speech: Clear and Coherent; Normal Rate  Speech Volume: Normal  Handedness: Right   Mood and Affect  Mood: "Better  Affect: Less Constricted   Thought Process  Thought Processes: Coherent; Goal Directed  Descriptions of Associations:Intact  Orientation:Full (Time, Place and Person)  Thought Content:Logical; WDL  History of Schizophrenia/Schizoaffective disorder:No  Duration of Psychotic Symptoms:Greater than six months  Hallucinations:Denies Ideas of Reference:None  Suicidal Thoughts:Denies Homicidal Thoughts:Denies  Sensorium  Memory: Immediate Fair; Recent Good  Judgment: Intact  Insight: Improving   Executive Functions  Concentration: Good  Attention Span: Good  Recall: Good  Fund of Knowledge: Good  Language: Good   Psychomotor Activity  Psychomotor Activity:Decreased  Assets  Assets: Communication Skills; Desire for Improvement; Financial Resources/Insurance; Leisure Time; Physical Health   Sleep  Sleep:Fair   Physical Exam: Physical Exam HENT:     Head: Normocephalic and atraumatic.     Nose: Nose normal.  Eyes:     Pupils: Pupils are equal, round, and reactive to light.  Pulmonary:     Effort: Pulmonary effort is normal.     Breath sounds: Normal breath sounds.  Skin:    General: Skin is warm.  Neurological:     General: No focal deficit present.     Mental Status: He is alert and oriented to person, place, and time.    Review of Systems  Constitutional:  Negative for fever.  HENT:   Negative for hearing loss and sore throat.   Respiratory:  Negative for cough and shortness of breath.   Cardiovascular:  Negative for chest pain and palpitations.  Gastrointestinal:  Negative for heartburn, nausea and vomiting.  Neurological:  Negative for dizziness and speech change.  Psychiatric/Behavioral:  Positive for depression, substance abuse and suicidal ideas.    Blood pressure (!) 142/105, pulse 76, temperature 97.6 F (36.4 C), resp. rate 18, height 6' (1.829 m), weight 117 kg, SpO2 96%. Body mass index is 34.99 kg/m.   Treatment Plan Summary: Daily contact with patient to assess and evaluate symptoms and progress in treatment and Medication management  Continue on amitriptyline, Seroquel, Lexapro has started on admission.  Med changes on 01/29/23 Decreased the dose of gabapentin from 1200 mg 3 times daily to 900 mg 3 times daily Decrease the dose of Ativan to 0.5 mg as needed every 6 hours for anxiety Decrease dose of tramadol to 50 mg every 12 hours for pain  Lipid profile  tomorrow AM  Lewanda Rife, MD

## 2023-02-02 NOTE — Group Note (Signed)
Date:  02/02/2023 Time:  10:33 AM  Group Topic/Focus:  Goals Group:   The focus of this group is to help patients establish daily goals to achieve during treatment and discuss how the patient can incorporate goal setting into their daily lives to aide in recovery. Managing Feelings:   The focus of this group is to identify what feelings patients have difficulty handling and develop a plan to handle them in a healthier way upon discharge. Self Care:   The focus of this group is to help patients understand the importance of self-care in order to improve or restore emotional, physical, spiritual, interpersonal, and financial health.    Participation Level:  Minimal  Participation Quality:  Appropriate  Affect:  Appropriate  Cognitive:  Appropriate  Insight: Limited  Engagement in Group:  Limited  Modes of Intervention:  Activity  Additional Comments:    Travis [name] 02/02/2023, 10:33 AM

## 2023-02-02 NOTE — Progress Notes (Signed)
   02/02/23 1948  Psych Admission Type (Psych Patients Only)  Admission Status Voluntary  Psychosocial Assessment  Patient Complaints Anxiety  Eye Contact Darting  Facial Expression Flat  Affect Anxious  Speech Logical/coherent  Interaction Assertive  Motor Activity Slow  Appearance/Hygiene In scrubs  Behavior Characteristics Anxious  Mood Anxious  Thought Process  Coherency WDL  Content WDL  Delusions None reported or observed  Perception WDL  Hallucination None reported or observed  Judgment WDL  Confusion None  Danger to Self  Current suicidal ideation? Denies  Agreement Not to Harm Self Yes  Description of Agreement verbal  Danger to Others  Danger to Others None reported or observed

## 2023-02-02 NOTE — Plan of Care (Signed)
  Problem: Education: Goal: Knowledge of [facility] Education information/materials will improve Outcome: Progressing Goal: Emotional status will improve Outcome: Progressing Goal: Mental status will improve Outcome: Progressing Goal: Verbalization of understanding the information provided will improve Outcome: Progressing   Problem: Activity: Goal: Interest or engagement in activities will improve Outcome: Progressing Goal: Sleeping patterns will improve Outcome: Progressing   Problem: Coping: Goal: Ability to verbalize frustrations and anger appropriately will improve Outcome: Progressing Goal: Ability to demonstrate self-control will improve Outcome: Progressing   Problem: Health Behavior/Discharge Planning: Goal: Identification of resources available to assist in meeting health care needs will improve Outcome: Progressing Goal: Compliance with treatment plan for underlying cause of condition will improve Outcome: Progressing   Problem: Physical Regulation: Goal: Ability to maintain clinical measurements within normal limits will improve Outcome: Progressing   Problem: Safety: Goal: Periods of time without injury will increase Outcome: Progressing   Problem: Education: Goal: Utilization of techniques to improve thought processes will improve Outcome: Progressing Goal: Knowledge of the prescribed therapeutic regimen will improve Outcome: Progressing   Problem: Activity: Goal: Interest or engagement in leisure activities will improve Outcome: Progressing Goal: Imbalance in normal sleep/wake cycle will improve Outcome: Progressing   Problem: Coping: Goal: Coping ability will improve Outcome: Progressing Goal: Will verbalize feelings Outcome: Progressing   Problem: Health Behavior/Discharge Planning: Goal: Ability to make decisions will improve Outcome: Progressing Goal: Compliance with therapeutic regimen will improve Outcome: Progressing    Problem: Role Relationship: Goal: Will demonstrate positive changes in social behaviors and relationships Outcome: Progressing   Problem: Safety: Goal: Ability to disclose and discuss suicidal ideas will improve Outcome: Progressing Goal: Ability to identify and utilize support systems that promote safety will improve Outcome: Progressing   Problem: Self-Concept: Goal: Will verbalize positive feelings about self Outcome: Progressing Goal: Level of anxiety will decrease Outcome: Progressing   Problem: Education: Goal: Ability to incorporate positive changes in behavior to improve self-esteem will improve Outcome: Progressing   Problem: Health Behavior/Discharge Planning: Goal: Ability to identify and utilize available resources and services will improve Outcome: Progressing Goal: Ability to remain free from injury will improve Outcome: Progressing   Problem: Self-Concept: Goal: Will verbalize positive feelings about self Outcome: Progressing   Problem: Skin Integrity: Goal: Demonstration of wound healing without infection will improve Outcome: Progressing   Problem: Education: Goal: Ability to state activities that reduce stress will improve Outcome: Progressing

## 2023-02-03 DIAGNOSIS — F332 Major depressive disorder, recurrent severe without psychotic features: Secondary | ICD-10-CM | POA: Diagnosis not present

## 2023-02-03 LAB — LIPID PANEL
Cholesterol: 140 mg/dL (ref 0–200)
HDL: 30 mg/dL — ABNORMAL LOW (ref >40)
LDL Cholesterol: 75 mg/dL (ref 0–99)
Total CHOL/HDL Ratio: 4.7 {ratio}
Triglycerides: 174 mg/dL — ABNORMAL HIGH (ref <150)
VLDL: 35 mg/dL (ref 0–40)

## 2023-02-03 NOTE — Group Note (Signed)
Date:  02/03/2023 Time:  6:49 AM  Group Topic/Focus:  Personal Choices and Values:   The focus of this group is to help patients assess and explore the importance of values in their lives, how their values affect their decisions, how they express their values and what opposes their expression.    Participation Level:  Active  Participation Quality:  Appropriate  Affect:  Appropriate  Cognitive:  Alert  Insight: Appropriate  Engagement in Group:  Engaged  Modes of Intervention:  Discussion  Additional Comments:    Maeola Harman 02/03/2023, 6:49 AM

## 2023-02-03 NOTE — BH IP Treatment Plan (Signed)
Interdisciplinary Treatment and Diagnostic Plan Update  02/03/2023 Time of Session: 9:30 AM  Colyn Angrisani MRN: [id number]  Principal Diagnosis: Major depressive disorder, recurrent episode, severe (HCC)  Secondary Diagnoses: Principal Problem:   Major depressive disorder, recurrent episode, severe (HCC) Active Problems:   Methamphetamine abuse (HCC)   Mild tetrahydrocannabinol (THC) abuse   Current Medications:  Current Facility-Administered Medications  Medication Dose Route Frequency Provider Last Rate Last Admin   acetaminophen (TYLENOL) tablet 650 mg  650 mg Oral Q6H PRN Maryagnes Amos, FNP   650 mg at 02/02/23 2240   alum & mag hydroxide-simeth (MAALOX/MYLANTA) 200-200-20 MG/5ML suspension 30 mL  30 mL Oral Q4H PRN Maryagnes Amos, FNP       amitriptyline (ELAVIL) tablet 25 mg  25 mg Oral QHS Sarina Ill, DO   25 mg at 02/02/23 2133   cloNIDine (CATAPRES) tablet 0.1 mg  0.1 mg Oral BID Sarina Ill, DO   0.1 mg at 02/03/23 1000   cyclobenzaprine (FLEXERIL) tablet 10 mg  10 mg Oral TID PRN Sarina Ill, DO   10 mg at 02/03/23 1003   escitalopram (LEXAPRO) tablet 10 mg  10 mg Oral QPC breakfast Sarina Ill, DO   10 mg at 02/03/23 1000   famotidine (PEPCID) tablet 20 mg  20 mg Oral Daily PRN Maryagnes Amos, FNP   20 mg at 01/24/23 0804   gabapentin (NEURONTIN) capsule 900 mg  900 mg Oral TID Lewanda Rife, MD   900 mg at 02/03/23 1001   loperamide (IMODIUM) capsule 4 mg  4 mg Oral PRN Sarina Ill, DO   4 mg at 01/26/23 0810   LORazepam (ATIVAN) tablet 0.5 mg  0.5 mg Oral Q6H PRN Lewanda Rife, MD   0.5 mg at 02/02/23 2134   losartan (COZAAR) tablet 50 mg  50 mg Oral QPC breakfast Sarina Ill, DO   50 mg at 02/03/23 2130   magnesium hydroxide (MILK OF MAGNESIA) suspension 30 mL  30 mL Oral Daily PRN Maryagnes Amos, FNP       metFORMIN (GLUCOPHAGE-XR) 24 hr tablet 500 mg  500 mg  Oral Q breakfast Maryagnes Amos, FNP   500 mg at 02/03/23 1000   metoprolol succinate (TOPROL-XL) 24 hr tablet 50 mg  50 mg Oral Daily Sarina Ill, DO   50 mg at 02/03/23 1002   nicotine (NICODERM CQ - dosed in mg/24 hours) patch 14 mg  14 mg Transdermal Daily Sarina Ill, DO   14 mg at 02/03/23 1004   OLANZapine (ZYPREXA) tablet 10 mg  10 mg Oral Q6H PRN Sarina Ill, DO       pantoprazole (PROTONIX) EC tablet 40 mg  40 mg Oral Daily Maryagnes Amos, FNP   40 mg at 02/03/23 1000   QUEtiapine (SEROQUEL) tablet 300 mg  300 mg Oral QHS Sarina Ill, DO   300 mg at 02/02/23 2134   traMADol (ULTRAM) tablet 50 mg  50 mg Oral Q12H PRN Lewanda Rife, MD   50 mg at 02/03/23 1001   PTA Medications: Medications Prior to Admission  Medication Sig Dispense Refill Last Dose   acetaminophen (TYLENOL) 500 MG tablet Take 1,000 mg by mouth every 6 (six) hours as needed for mild pain or moderate pain.      CLARITIN 10 MG tablet Take 10 mg by mouth daily as needed for allergies.      cyclobenzaprine (FLEXERIL) 10 MG tablet Take 10  mg by mouth 2 (two) times daily.      famotidine (PEPCID) 20 MG tablet Take 20 mg by mouth daily as needed for heartburn or indigestion. (Patient not taking: Reported on 01/20/2023)      gabapentin (NEURONTIN) 600 MG tablet Take 1,200 mg by mouth 3 (three) times daily.      lisinopril (ZESTRIL) 20 MG tablet Take 20 mg by mouth daily.      metFORMIN (GLUCOPHAGE-XR) 500 MG 24 hr tablet Take 500 mg by mouth daily with breakfast. (Patient not taking: Reported on 01/20/2023)      metoprolol tartrate (LOPRESSOR) 100 MG tablet Take 100 mg by mouth 2 (two) times daily.      pantoprazole (PROTONIX) 40 MG tablet Take 1 tablet (40 mg total) by mouth daily. (Patient not taking: Reported on 01/20/2023) 30 tablet 0    QUEtiapine (SEROQUEL) 200 MG tablet Take 300 mg by mouth at bedtime.      sertraline (ZOLOFT) 25 MG tablet Take 1 tablet  (25 mg total) by mouth daily. 14 tablet 0     Patient Stressors: Financial difficulties   Marital or family conflict   Substance abuse    Patient Strengths: Active sense of humor   Treatment Modalities: Medication Management, Group therapy, Case management,  1 to 1 session with clinician, Psychoeducation, Recreational therapy.   Physician Treatment Plan for Primary Diagnosis: Major depressive disorder, recurrent episode, severe (HCC) Long Term Goal(s): Improvement in symptoms so as ready for discharge   Short Term Goals: Ability to identify changes in lifestyle to reduce recurrence of condition will improve Ability to verbalize feelings will improve Ability to disclose and discuss suicidal ideas Ability to demonstrate self-control will improve Ability to identify and develop effective coping behaviors will improve Ability to maintain clinical measurements within normal limits will improve Compliance with prescribed medications will improve Ability to identify triggers associated with substance abuse/mental health issues will improve  Medication Management: Evaluate patient's response, side effects, and tolerance of medication regimen.  Therapeutic Interventions: 1 to 1 sessions, Unit Group sessions and Medication administration.  Evaluation of Outcomes: Progressing  Physician Treatment Plan for Secondary Diagnosis: Principal Problem:   Major depressive disorder, recurrent episode, severe (HCC) Active Problems:   Methamphetamine abuse (HCC)   Mild tetrahydrocannabinol (THC) abuse  Long Term Goal(s): Improvement in symptoms so as ready for discharge   Short Term Goals: Ability to identify changes in lifestyle to reduce recurrence of condition will improve Ability to verbalize feelings will improve Ability to disclose and discuss suicidal ideas Ability to demonstrate self-control will improve Ability to identify and develop effective coping behaviors will improve Ability to  maintain clinical measurements within normal limits will improve Compliance with prescribed medications will improve Ability to identify triggers associated with substance abuse/mental health issues will improve     Medication Management: Evaluate patient's response, side effects, and tolerance of medication regimen.  Therapeutic Interventions: 1 to 1 sessions, Unit Group sessions and Medication administration.  Evaluation of Outcomes: Progressing   RN Treatment Plan for Primary Diagnosis: Major depressive disorder, recurrent episode, severe (HCC) Long Term Goal(s): Knowledge of disease and therapeutic regimen to maintain health will improve  Short Term Goals: Ability to remain free from injury will improve, Ability to verbalize frustration and anger appropriately will improve, Ability to demonstrate self-control, Ability to participate in decision making will improve, Ability to verbalize feelings will improve, Ability to disclose and discuss suicidal ideas, Ability to identify and develop effective coping behaviors will improve, and  Compliance with prescribed medications will improve  Medication Management: RN will administer medications as ordered by provider, will assess and evaluate patient's response and provide education to patient for prescribed medication. RN will report any adverse and/or side effects to prescribing provider.  Therapeutic Interventions: 1 on 1 counseling sessions, Psychoeducation, Medication administration, Evaluate responses to treatment, Monitor vital signs and CBGs as ordered, Perform/monitor CIWA, COWS, AIMS and Fall Risk screenings as ordered, Perform wound care treatments as ordered.  Evaluation of Outcomes: Progressing   LCSW Treatment Plan for Primary Diagnosis: Major depressive disorder, recurrent episode, severe (HCC) Long Term Goal(s): Safe transition to appropriate next level of care at discharge, Engage patient in therapeutic group addressing  interpersonal concerns.  Short Term Goals: Engage patient in aftercare planning with referrals and resources, Increase social support, Increase ability to appropriately verbalize feelings, Increase emotional regulation, Facilitate acceptance of mental health diagnosis and concerns, Facilitate patient progression through stages of change regarding substance use diagnoses and concerns, Identify triggers associated with mental health/substance abuse issues, and Increase skills for wellness and recovery  Therapeutic Interventions: Assess for all discharge needs, 1 to 1 time with Social worker, Explore available resources and support systems, Assess for adequacy in community support network, Educate family and significant other(s) on suicide prevention, Complete Psychosocial Assessment, Interpersonal group therapy.  Evaluation of Outcomes: Progressing   Progress in Treatment: Attending groups: Yes. Participating in groups: Yes. Taking medication as prescribed: Yes. Toleration medication: Yes. Family/Significant other contact made: No, will contact:  CSW attempted to contact  [employer], unable to reach  Patient understands diagnosis: Yes. Discussing patient identified problems/goals with staff: Yes. Medical problems stabilized or resolved: Yes. Denies suicidal/homicidal ideation: Yes. Issues/concerns per patient self-inventory: No. Other: None   New problem(s) identified: No, Describe:  None identified  Update 01/29/23: No changes at this time Update 02/03/23: No changes at this time    New Short Term/Long Term Goal(s): detox, elimination of symptoms of psychosis, medication management for mood stabilization; elimination of SI thoughts; development of comprehensive mental wellness/sobriety plan. Update 01/29/23: No changes at this time Update 02/03/23: No changes at this time      Patient Goals:  "I want to feel like I'm not going to run out and go into traffic" Update 01/29/23: No changes  at this time Update 02/03/23: No changes at this time      Discharge Plan or Barriers: CSW will assist with appropriate discharge planning Update 01/29/23: No changes at this time Update 02/03/23: No changes at this time      Reason for Continuation of Hospitalization: Depression Medication stabilization Suicidal ideation   Estimated Length of Stay: 1 to 7 days Update 01/29/23: TBD Update 02/03/23: TBD  Last 3 [place] Suicide Severity Risk Score: Flowsheet Row Admission (Current) from 01/23/2023 in [facility] Most recent reading at 01/23/2023  3:00 AM ED from 01/21/2023 in [facility] Emergency Department at [facility] Most recent reading at 01/21/2023 10:49 PM ED from 01/21/2023 in [facility] Emergency Department at [facility] Most recent reading at 01/21/2023  4:52 PM  C-SSRS RISK CATEGORY High Risk High Risk No Risk       Last PHQ 2/9 Scores:    01/22/2023   12:34 AM 08/26/2014    4:11 PM 04/07/2014   11:30 AM  Depression screen PHQ 2/9  Decreased Interest 2 2 1   Down, Depressed, Hopeless 2 2 3   PHQ - 2 Score 4 4 4   Altered sleeping 3 3  3  Tired, decreased energy 3 3 2   Change in appetite 2 2 2   Feeling bad or failure about yourself  3 3 0  Trouble concentrating 3 3 1   Moving slowly or fidgety/restless 0 0 0  Suicidal thoughts 1 1 0  PHQ-9 Score 19 19 12   Difficult doing work/chores Very difficult      Scribe for Treatment Team: Elza Rafter, LCSWA 02/03/2023 11:08 AM

## 2023-02-03 NOTE — Group Note (Signed)
Date:  02/03/2023 Time:  8:39 PM  Group Topic/Focus:  Dimensions of Wellness:   The focus of this group is to introduce the topic of wellness and discuss the role each dimension of wellness plays in total health. Self Care:   The focus of this group is to help patients understand the importance of self-care in order to improve or restore emotional, physical, spiritual, interpersonal, and financial health.    Participation Level:  Active  Participation Quality:  Sharing  Affect:  Appropriate  Cognitive:  Appropriate  Insight: Improving  Engagement in Group:  Engaged  Modes of Intervention:  Discussion  Additional Comments:    Osker Mason 02/03/2023, 8:39 PM

## 2023-02-03 NOTE — Group Note (Signed)
Recreation Therapy Group Note   Group Topic:Coping Skills  Group Date: 02/03/2023 Start Time: 1400 End Time: 1450 Facilitators: Rosina Lowenstein, LRT, CTRS Location:  Dayroom  Group Description: Mind Map.  Patient was provided a blank template of a diagram with 32 blank boxes in a tiered system, branching from the center (similar to a bubble chart). LRT directed patients to label the middle of the diagram "Coping Skills". LRT and patients then came up with 8 different coping skills as examples. Pt were directed to record their coping skills in the 2nd tier boxes closest to the center.  Patients would then share their coping skills with the group as LRT wrote them out. LRT gave a handout of 99 different coping skills at the end of group.   Goal Area(s) Addressed: Patients will be able to define "coping skills". Patient will identify new coping skills.  Patient will increase communication.   Affect/Mood: Appropriate   Participation Level: Active and Engaged   Participation Quality: Independent   Behavior: Appropriate, Calm, and Cooperative   Speech/Thought Process: Coherent   Insight: Good   Judgement: Good   Modes of Intervention: Education, Guided Discussion, and Worksheet   Patient Response to Interventions:  Attentive, Engaged, Interested , and Receptive   Education Outcome:  Acknowledges education   Clinical Observations/Individualized Feedback: Travis [name] was active in their participation of session activities and group discussion. Pt identified "pet my dog, Dia Sitter, talk to family, go fishing and think about the future" as coping skills. Pt shared that he is familiar with coping skills and "has a head start from taking anger management classes in the past". Pt interacted well with LRT and peers duration of session.    Plan: Continue to engage patient in RT group sessions 2-3x/week.   Rosina Lowenstein, LRT, CTRS 02/03/2023 3:05 PM

## 2023-02-03 NOTE — Progress Notes (Signed)
[facility] MD Progress Note  02/03/2023 1:22 PM Travis [name]  MRN:  [id number] Subjective: Travis [name] is seen on rounds.  He tells me that he is continuing to look for a drug and alcohol rehab.  I asked that he had spoken to his sister and he states that she does not answer the phone.  I do not think he is safe to be discharged to the street.  He has been compliant with medications and denies any side effects. Principal Problem: Major depressive disorder, recurrent episode, severe (HCC) Diagnosis: Principal Problem:   Major depressive disorder, recurrent episode, severe (HCC) Active Problems:   Methamphetamine abuse (HCC)   Mild tetrahydrocannabinol (THC) abuse  Total Time spent with patient: 15 minutes  Past Psychiatric History: Depression and polysubstance abuse  Past Medical History:  Past Medical History:  Diagnosis Date   Anxiety    Panic attacks   Chest pain    Hospital, March, 2014, negative enzymes, patient refused in-hospital  stress test, patient canceled outpatient stress test   Constipation    Degenerative disk disease    Degenerative disk disease    Depression    GERD (gastroesophageal reflux disease)    Hypertension    Incontinence of urine    Neuromuscular disorder (HCC)    Obesity    Polysubstance abuse (HCC)    PTSD (post-traumatic stress disorder)    Pulmonary emboli (HCC)    Seizures (HCC)    Shortness of breath dyspnea    with exertion   Spinal stenosis    Spinal stenosis    Suicide attempt (HCC)    Tachycardia - pulse    Tobacco abuse     Past Surgical History:  Procedure Laterality Date   COLONOSCOPY N/A 08/24/2014   Procedure: COLONOSCOPY;  Surgeon: Charlott Rakes, MD;  Location: [facility] ENDOSCOPY;  Service: Endoscopy;  Laterality: N/A;   ESOPHAGOGASTRODUODENOSCOPY (EGD) WITH PROPOFOL N/A 08/24/2014   Procedure: ESOPHAGOGASTRODUODENOSCOPY (EGD) WITH PROPOFOL;  Surgeon: Charlott Rakes, MD;  Location: [facility] ENDOSCOPY;  Service: Endoscopy;  Laterality: N/A;   WISDOM  TOOTH EXTRACTION     Family History:  Family History  Problem Relation Age of Onset   Stroke Mother    Hypertension Mother    Hyperlipidemia Mother    Stroke Father    Hypertension Father    Dementia Father    Diabetes Father    Heart disease Father    Hyperlipidemia Father    Cancer Sister        brain   Diabetes Sister    Heart disease Sister    Hyperlipidemia Sister    Hypertension Sister    Stroke Sister    Heart disease Brother    Hyperlipidemia Brother    Family Psychiatric  History: Unremarkable Social History:  Social History   Substance and Sexual Activity  Alcohol Use No     Social History   Substance and Sexual Activity  Drug Use Not Currently   Types: Amphetamines, Marijuana, Benzodiazepines    Social History   Socioeconomic History   Marital status: Divorced    Spouse name: Not on file   Number of children: Not on file   Years of education: Not on file   Highest education level: Not on file  Occupational History   Not on file  Tobacco Use   Smoking status: Every Day    Current packs/day: 0.00    Types: Cigarettes    Start date: 12/12/2016    Last attempt to quit: 12/12/2016    Years  since quitting: 6.1   Smokeless tobacco: Never  Vaping Use   Vaping status: Every Day   Substances: Nicotine  Substance and Sexual Activity   Alcohol use: No   Drug use: Not Currently    Types: Amphetamines, Marijuana, Benzodiazepines   Sexual activity: Never  Other Topics Concern   Not on file  Social History Narrative   Not on file   Social Determinants of Health   Financial Resource Strain: High Risk (01/09/2023)   Received from [facility]   Overall Financial Resource Strain (CARDIA)    Difficulty of Paying Living Expenses: Hard  Food Insecurity: Food Insecurity Present (01/23/2023)   Hunger Vital Sign    Worried About Running Out of Food in the Last Year: Sometimes true    Ran Out of Food in the Last Year: Sometimes true  Transportation  Needs: Unmet Transportation Needs (01/23/2023)   PRAPARE - Administrator, Civil Service (Medical): Yes    Lack of Transportation (Non-Medical): Yes  Physical Activity: Inactive (01/09/2023)   Received from [facility]   Exercise Vital Sign    Days of Exercise per Week: 0 days    Minutes of Exercise per Session: 10 min  Stress: Stress Concern Present (01/09/2023)   Received from [facility] - Occupational Stress Questionnaire    Feeling of Stress : Rather much  Social Connections: Moderately Integrated (01/09/2023)   Received from [facility]   Social Network    How would you rate your social network (family, work, friends)?: Adequate participation with social networks   Additional Social History:                         Sleep: Good  Appetite:  Good  Current Medications: Current Facility-Administered Medications  Medication Dose Route Frequency Provider Last Rate Last Admin   acetaminophen (TYLENOL) tablet 650 mg  650 mg Oral Q6H PRN Maryagnes Amos, FNP   650 mg at 02/03/23 1315   alum & mag hydroxide-simeth (MAALOX/MYLANTA) 200-200-20 MG/5ML suspension 30 mL  30 mL Oral Q4H PRN Maryagnes Amos, FNP       amitriptyline (ELAVIL) tablet 25 mg  25 mg Oral QHS Sarina Ill, DO   25 mg at 02/02/23 2133   cloNIDine (CATAPRES) tablet 0.1 mg  0.1 mg Oral BID Sarina Ill, DO   0.1 mg at 02/03/23 1000   cyclobenzaprine (FLEXERIL) tablet 10 mg  10 mg Oral TID PRN Sarina Ill, DO   10 mg at 02/03/23 1003   escitalopram (LEXAPRO) tablet 10 mg  10 mg Oral QPC breakfast Sarina Ill, DO   10 mg at 02/03/23 1000   famotidine (PEPCID) tablet 20 mg  20 mg Oral Daily PRN Maryagnes Amos, FNP   20 mg at 01/24/23 0804   gabapentin (NEURONTIN) capsule 900 mg  900 mg Oral TID Lewanda Rife, MD   900 mg at 02/03/23 1001   loperamide (IMODIUM) capsule 4 mg  4 mg Oral  PRN Sarina Ill, DO   4 mg at 01/26/23 0810   LORazepam (ATIVAN) tablet 0.5 mg  0.5 mg Oral Q6H PRN Lewanda Rife, MD   0.5 mg at 02/02/23 2134   losartan (COZAAR) tablet 50 mg  50 mg Oral QPC breakfast Sarina Ill, DO   50 mg at 02/03/23 0959   magnesium hydroxide (MILK OF MAGNESIA) suspension 30 mL  30 mL Oral  Daily PRN Maryagnes Amos, FNP       metFORMIN (GLUCOPHAGE-XR) 24 hr tablet 500 mg  500 mg Oral Q breakfast Maryagnes Amos, FNP   500 mg at 02/03/23 1000   metoprolol succinate (TOPROL-XL) 24 hr tablet 50 mg  50 mg Oral Daily Sarina Ill, DO   50 mg at 02/03/23 1002   nicotine (NICODERM CQ - dosed in mg/24 hours) patch 14 mg  14 mg Transdermal Daily Sarina Ill, DO   14 mg at 02/03/23 1004   OLANZapine (ZYPREXA) tablet 10 mg  10 mg Oral Q6H PRN Sarina Ill, DO       pantoprazole (PROTONIX) EC tablet 40 mg  40 mg Oral Daily Maryagnes Amos, FNP   40 mg at 02/03/23 1000   QUEtiapine (SEROQUEL) tablet 300 mg  300 mg Oral QHS Sarina Ill, DO   300 mg at 02/02/23 2134   traMADol (ULTRAM) tablet 50 mg  50 mg Oral Q12H PRN Lewanda Rife, MD   50 mg at 02/03/23 1001    Lab Results:  Results for orders placed or performed during the hospital encounter of 01/23/23 (from the past 48 hour(s))  Lipid panel     Status: Abnormal   Collection Time: 02/03/23  6:30 AM  Result Value Ref Range   Cholesterol 140 0 - 200 mg/dL   Triglycerides 366 (H) <150 mg/dL   HDL 30 (L) >44 mg/dL   Total CHOL/HDL Ratio 4.7 RATIO   VLDL 35 0 - 40 mg/dL   LDL Cholesterol 75 0 - 99 mg/dL    Comment:        Total Cholesterol/HDL:CHD Risk Coronary Heart Disease Risk Table                     Men   Women  1/2 Average Risk   3.4   3.3  Average Risk       5.0   4.4  2 X Average Risk   9.6   7.1  3 X Average Risk  23.4   11.0        Use the calculated Patient Ratio above and the CHD Risk Table to determine the  patient's CHD Risk.        ATP III CLASSIFICATION (LDL):  <100     mg/dL   Optimal  034-742  mg/dL   Near or Above                    Optimal  130-159  mg/dL   Borderline  595-638  mg/dL   High  >756     mg/dL   Very High Performed at [facility], [address] [place], [name] [address]     Blood Alcohol level:  Lab Results  Component Value Date   [facility] <10 01/20/2023   ETH <10 02/28/2018    Metabolic Disorder Labs: Lab Results  Component Value Date   HGBA1C 5.8 (H) 01/31/2023   MPG 119.76 01/31/2023   MPG 123 (H) 05/29/2012   No results found for: "PROLACTIN" Lab Results  Component Value Date   CHOL 140 02/03/2023   TRIG 174 (H) 02/03/2023   HDL 30 (L) 02/03/2023   CHOLHDL 4.7 02/03/2023   VLDL 35 02/03/2023   LDLCALC 75 02/03/2023   LDLCALC 73 05/30/2012    Physical Findings: AIMS:  , ,  ,  ,    CIWA:    COWS:  Musculoskeletal: Strength & Muscle Tone: within normal limits Gait & Station: normal Patient leans: N/A  Psychiatric Specialty Exam:  Presentation  General Appearance:  Appropriate for Environment  Eye Contact: Good  Speech: Clear and Coherent; Normal Rate  Speech Volume: Normal  Handedness: Right   Mood and Affect  Mood: Anxious; Dysphoric  Affect: Congruent   Thought Process  Thought Processes: Coherent; Goal Directed  Descriptions of Associations:Intact  Orientation:Full (Time, Place and Person)  Thought Content:Logical; WDL  History of Schizophrenia/Schizoaffective disorder:No  Duration of Psychotic Symptoms:Greater than six months  Hallucinations:No data recorded Ideas of Reference:None  Suicidal Thoughts:No data recorded Homicidal Thoughts:No data recorded  Sensorium  Memory: Immediate Fair; Recent Good  Judgment: Intact  Insight: Fair; Present   Executive Functions  Concentration: Good  Attention Span: Good  Recall: Good  Fund of  Knowledge: Good  Language: Good   Psychomotor Activity  Psychomotor Activity:No data recorded  Assets  Assets: Communication Skills; Desire for Improvement; Financial Resources/Insurance; Leisure Time; Physical Health   Sleep  Sleep:No data recorded    Blood pressure (!) 130/95, pulse 70, temperature (!) 97.3 F (36.3 C), resp. rate 18, height 6' (1.829 m), weight 117 kg, SpO2 95%. Body mass index is 34.99 kg/m.   Treatment Plan Summary: Daily contact with patient to assess and evaluate symptoms and progress in treatment, Medication management, and Plan continue current medication.  Hailea Eaglin Tresea Mall, DO 02/03/2023, 1:22 PM

## 2023-02-03 NOTE — Plan of Care (Signed)
  Problem: Education: Goal: Knowledge of [facility] Education information/materials will improve Outcome: Progressing Goal: Emotional status will improve Outcome: Progressing Goal: Mental status will improve Outcome: Progressing Goal: Verbalization of understanding the information provided will improve Outcome: Progressing   Problem: Activity: Goal: Interest or engagement in activities will improve Outcome: Progressing   

## 2023-02-03 NOTE — Progress Notes (Signed)
Patient is a voluntary admission to [facility] for depression and poly substance abuse.  He has a list of rehabs to call for help with substance abuse. Spends most of his days sleeping in his room, does not participate in group activities.  Complains of chronic pain for which he receives tramadol, flexeril, gabapentin and tylenol. Denies SI, HI, AVH, depression. Endorses anxiety at the thought of being discharged. Will continue to monitor.

## 2023-02-04 DIAGNOSIS — F332 Major depressive disorder, recurrent severe without psychotic features: Secondary | ICD-10-CM | POA: Diagnosis not present

## 2023-02-04 MED ORDER — ESCITALOPRAM OXALATE 10 MG PO TABS
20.0000 mg | ORAL_TABLET | Freq: Every day | ORAL | Status: DC
  Administered 2023-02-05 – 2023-02-11 (×7): 20 mg via ORAL
  Filled 2023-02-04 (×7): qty 2

## 2023-02-04 NOTE — Group Note (Signed)
[facility] LCSW Group Therapy Note   Group Date: 02/04/2023 Start Time: 1300 End Time: 1400   Type of Therapy/Topic:  Group Therapy:  Emotion Regulation  Participation Level:  Active   Mood:  Description of Group:    The purpose of this group is to assist patients in learning to regulate negative emotions and experience positive emotions. Patients will be guided to discuss ways in which they have been vulnerable to their negative emotions. These vulnerabilities will be juxtaposed with experiences of positive emotions or situations, and patients challenged to use positive emotions to combat negative ones. Special emphasis will be placed on coping with negative emotions in conflict situations, and patients will process healthy conflict resolution skills.  Therapeutic Goals: Patient will identify two positive emotions or experiences to reflect on in order to balance out negative emotions:  Patient will label two or more emotions that they find the most difficult to experience:  Patient will be able to demonstrate positive conflict resolution skills through discussion or role plays:   Summary of Patient Progress:   Pt discussed anger regarding not being able to find a facility that will take him because of his health     Therapeutic Modalities:   Cognitive Behavioral Therapy Feelings Identification Dialectical Behavioral Therapy   Elza Rafter, LCSWA

## 2023-02-04 NOTE — Progress Notes (Signed)
[facility] MD Progress Note  02/04/2023 1:30 PM Travis [name]  MRN:  [id number] Subjective: Travis [name] is seen on rounds.  He continues to look for drug and alcohol rehab.  He still is suicidal if he is discharged to a shelter.  He says that the Lexapro has been very helpful and asks if we can go up on it.  He denies any side effects from his medications. Principal Problem: Major depressive disorder, recurrent episode, severe (HCC) Diagnosis: Principal Problem:   Major depressive disorder, recurrent episode, severe (HCC) Active Problems:   Methamphetamine abuse (HCC)   Mild tetrahydrocannabinol (THC) abuse  Total Time spent with patient: 15 minutes  Past Psychiatric History: Polysubstance abuse and depression  Past Medical History:  Past Medical History:  Diagnosis Date   Anxiety    Panic attacks   Chest pain    Hospital, March, 2014, negative enzymes, patient refused in-hospital  stress test, patient canceled outpatient stress test   Constipation    Degenerative disk disease    Degenerative disk disease    Depression    GERD (gastroesophageal reflux disease)    Hypertension    Incontinence of urine    Neuromuscular disorder (HCC)    Obesity    Polysubstance abuse (HCC)    PTSD (post-traumatic stress disorder)    Pulmonary emboli (HCC)    Seizures (HCC)    Shortness of breath dyspnea    with exertion   Spinal stenosis    Spinal stenosis    Suicide attempt (HCC)    Tachycardia - pulse    Tobacco abuse     Past Surgical History:  Procedure Laterality Date   COLONOSCOPY N/A 08/24/2014   Procedure: COLONOSCOPY;  Surgeon: Charlott Rakes, MD;  Location: [facility] ENDOSCOPY;  Service: Endoscopy;  Laterality: N/A;   ESOPHAGOGASTRODUODENOSCOPY (EGD) WITH PROPOFOL N/A 08/24/2014   Procedure: ESOPHAGOGASTRODUODENOSCOPY (EGD) WITH PROPOFOL;  Surgeon: Charlott Rakes, MD;  Location: [facility] ENDOSCOPY;  Service: Endoscopy;  Laterality: N/A;   WISDOM TOOTH EXTRACTION     Family History:  Family History   Problem Relation Age of Onset   Stroke Mother    Hypertension Mother    Hyperlipidemia Mother    Stroke Father    Hypertension Father    Dementia Father    Diabetes Father    Heart disease Father    Hyperlipidemia Father    Cancer Sister        brain   Diabetes Sister    Heart disease Sister    Hyperlipidemia Sister    Hypertension Sister    Stroke Sister    Heart disease Brother    Hyperlipidemia Brother    Family Psychiatric  History: Unremarkable Social History:  Social History   Substance and Sexual Activity  Alcohol Use No     Social History   Substance and Sexual Activity  Drug Use Not Currently   Types: Amphetamines, Marijuana, Benzodiazepines    Social History   Socioeconomic History   Marital status: Divorced    Spouse name: Not on file   Number of children: Not on file   Years of education: Not on file   Highest education level: Not on file  Occupational History   Not on file  Tobacco Use   Smoking status: Every Day    Current packs/day: 0.00    Types: Cigarettes    Start date: 12/12/2016    Last attempt to quit: 12/12/2016    Years since quitting: 6.1   Smokeless tobacco: Never  Vaping Use  Vaping status: Every Day   Substances: Nicotine  Substance and Sexual Activity   Alcohol use: No   Drug use: Not Currently    Types: Amphetamines, Marijuana, Benzodiazepines   Sexual activity: Never  Other Topics Concern   Not on file  Social History Narrative   Not on file   Social Determinants of Health   Financial Resource Strain: High Risk (01/09/2023)   Received from [facility]   Overall Financial Resource Strain (CARDIA)    Difficulty of Paying Living Expenses: Hard  Food Insecurity: Food Insecurity Present (01/23/2023)   Hunger Vital Sign    Worried About Running Out of Food in the Last Year: Sometimes true    Ran Out of Food in the Last Year: Sometimes true  Transportation Needs: Unmet Transportation Needs (01/23/2023)   PRAPARE -  Administrator, Civil Service (Medical): Yes    Lack of Transportation (Non-Medical): Yes  Physical Activity: Inactive (01/09/2023)   Received from [facility]   Exercise Vital Sign    Days of Exercise per Week: 0 days    Minutes of Exercise per Session: 10 min  Stress: Stress Concern Present (01/09/2023)   Received from [facility] of Occupational Health - Occupational Stress Questionnaire    Feeling of Stress : Rather much  Social Connections: Moderately Integrated (01/09/2023)   Received from [facility]   Social Network    How would you rate your social network (family, work, friends)?: Adequate participation with social networks   Additional Social History:                         Sleep: Good  Appetite:  Good  Current Medications: Current Facility-Administered Medications  Medication Dose Route Frequency Provider Last Rate Last Admin   acetaminophen (TYLENOL) tablet 650 mg  650 mg Oral Q6H PRN Maryagnes Amos, FNP   650 mg at 02/03/23 2344   alum & mag hydroxide-simeth (MAALOX/MYLANTA) 200-200-20 MG/5ML suspension 30 mL  30 mL Oral Q4H PRN Maryagnes Amos, FNP       amitriptyline (ELAVIL) tablet 25 mg  25 mg Oral QHS Sarina Ill, DO   25 mg at 02/03/23 2129   cloNIDine (CATAPRES) tablet 0.1 mg  0.1 mg Oral BID Sarina Ill, DO   0.1 mg at 02/04/23 1012   cyclobenzaprine (FLEXERIL) tablet 10 mg  10 mg Oral TID PRN Sarina Ill, DO   10 mg at 02/04/23 1013   [START ON 02/05/2023] escitalopram (LEXAPRO) tablet 20 mg  20 mg Oral QPC breakfast Sarina Ill, DO       famotidine (PEPCID) tablet 20 mg  20 mg Oral Daily PRN Maryagnes Amos, FNP   20 mg at 01/24/23 0804   gabapentin (NEURONTIN) capsule 900 mg  900 mg Oral TID Lewanda Rife, MD   900 mg at 02/04/23 1012   loperamide (IMODIUM) capsule 4 mg  4 mg Oral PRN Sarina Ill, DO   4 mg at 01/26/23 0810    LORazepam (ATIVAN) tablet 0.5 mg  0.5 mg Oral Q6H PRN Lewanda Rife, MD   0.5 mg at 02/04/23 1012   losartan (COZAAR) tablet 50 mg  50 mg Oral QPC breakfast Sarina Ill, DO   50 mg at 02/04/23 1012   magnesium hydroxide (MILK OF MAGNESIA) suspension 30 mL  30 mL Oral Daily PRN Maryagnes Amos, FNP       metFORMIN (  GLUCOPHAGE-XR) 24 hr tablet 500 mg  500 mg Oral Q breakfast Maryagnes Amos, FNP   500 mg at 02/04/23 1013   metoprolol succinate (TOPROL-XL) 24 hr tablet 50 mg  50 mg Oral Daily Sarina Ill, DO   50 mg at 02/04/23 1012   nicotine (NICODERM CQ - dosed in mg/24 hours) patch 14 mg  14 mg Transdermal Daily Sarina Ill, DO   14 mg at 02/04/23 1013   OLANZapine (ZYPREXA) tablet 10 mg  10 mg Oral Q6H PRN Sarina Ill, DO       pantoprazole (PROTONIX) EC tablet 40 mg  40 mg Oral Daily Maryagnes Amos, FNP   40 mg at 02/04/23 1012   QUEtiapine (SEROQUEL) tablet 300 mg  300 mg Oral QHS Sarina Ill, DO   300 mg at 02/03/23 2129   traMADol (ULTRAM) tablet 50 mg  50 mg Oral Q12H PRN Lewanda Rife, MD   50 mg at 02/04/23 1012    Lab Results:  Results for orders placed or performed during the hospital encounter of 01/23/23 (from the past 48 hour(s))  Lipid panel     Status: Abnormal   Collection Time: 02/03/23  6:30 AM  Result Value Ref Range   Cholesterol 140 0 - 200 mg/dL   Triglycerides 161 (H) <150 mg/dL   HDL 30 (L) >09 mg/dL   Total CHOL/HDL Ratio 4.7 RATIO   VLDL 35 0 - 40 mg/dL   LDL Cholesterol 75 0 - 99 mg/dL    Comment:        Total Cholesterol/HDL:CHD Risk Coronary Heart Disease Risk Table                     Men   Women  1/2 Average Risk   3.4   3.3  Average Risk       5.0   4.4  2 X Average Risk   9.6   7.1  3 X Average Risk  23.4   11.0        Use the calculated Patient Ratio above and the CHD Risk Table to determine the patient's CHD Risk.        ATP III CLASSIFICATION (LDL):  <100      mg/dL   Optimal  604-540  mg/dL   Near or Above                    Optimal  130-159  mg/dL   Borderline  981-191  mg/dL   High  >478     mg/dL   Very High Performed at [facility], [address] [place], [name] [address]     Blood Alcohol level:  Lab Results  Component Value Date   [facility] <10 01/20/2023   ETH <10 02/28/2018    Metabolic Disorder Labs: Lab Results  Component Value Date   HGBA1C 5.8 (H) 01/31/2023   MPG 119.76 01/31/2023   MPG 123 (H) 05/29/2012   No results found for: "PROLACTIN" Lab Results  Component Value Date   CHOL 140 02/03/2023   TRIG 174 (H) 02/03/2023   HDL 30 (L) 02/03/2023   CHOLHDL 4.7 02/03/2023   VLDL 35 02/03/2023   LDLCALC 75 02/03/2023   LDLCALC 73 05/30/2012    Physical Findings: AIMS:  , ,  ,  ,    CIWA:    COWS:     Musculoskeletal: Strength & Muscle Tone: within normal limits Gait & Station: normal Patient  leans: N/A  Psychiatric Specialty Exam:  Presentation  General Appearance:  Appropriate for Environment  Eye Contact: Good  Speech: Clear and Coherent; Normal Rate  Speech Volume: Normal  Handedness: Right   Mood and Affect  Mood: Anxious; Dysphoric  Affect: Congruent   Thought Process  Thought Processes: Coherent; Goal Directed  Descriptions of Associations:Intact  Orientation:Full (Time, Place and Person)  Thought Content:Logical; WDL  History of Schizophrenia/Schizoaffective disorder:No  Duration of Psychotic Symptoms:Greater than six months  Hallucinations:No data recorded Ideas of Reference:None  Suicidal Thoughts:No data recorded Homicidal Thoughts:No data recorded  Sensorium  Memory: Immediate Fair; Recent Good  Judgment: Intact  Insight: Fair; Present   Executive Functions  Concentration: Good  Attention Span: Good  Recall: Good  Fund of Knowledge: Good  Language: Good   Psychomotor Activity  Psychomotor Activity:No data  recorded  Assets  Assets: Communication Skills; Desire for Improvement; Financial Resources/Insurance; Leisure Time; Physical Health   Sleep  Sleep:No data recorded   Blood pressure (!) 139/96, pulse 66, temperature 97.8 F (36.6 C), resp. rate 16, height 6' (1.829 m), weight 117 kg, SpO2 96%. Body mass index is 34.99 kg/m.   Treatment Plan Summary: Daily contact with patient to assess and evaluate symptoms and progress in treatment, Medication management, and Plan increase Lexapro to 20 mg/day.  Samyak Sackmann Tresea Mall, DO 02/04/2023, 1:30 PM

## 2023-02-04 NOTE — Group Note (Signed)
Recreation Therapy Group Note   Group Topic:Self-Esteem  Group Date: 02/04/2023 Start Time: 1405 End Time: 1455 Facilitators: Rosina Lowenstein, LRT, CTRS Location:  Dayroom  Group Description: My strengths and Qualities. Patients and LRT discussed the importance of self-love/self-esteem and things that cause it to fluctuate, including our mental health or state. Pt completed a worksheet that helps them identify 24 different strengths and qualities about themselves. Pt encouraged to read aloud at least 3 off their sheet to the group. LRT and pts discussed how this can be applied to daily life post-discharge.  Pt's then played "Positive Affirmation Bingo" afterwards, with stress balls as prizes.     Goal Area(s) Addressed: Patient will identify positive qualities about themselves. Patient will learn new positive affirmations.  Patient will recite positive qualities and affirmations aloud to the group.  Patient will practice positive self-talk.  Patient will increase communication.   Affect/Mood: Appropriate   Participation Level: Active and Engaged   Participation Quality: Independent   Behavior: Appropriate, Calm, and Cooperative   Speech/Thought Process: Coherent   Insight: Good   Judgement: Good   Modes of Intervention: Education, Guided Discussion, and Worksheet   Patient Response to Interventions:  Attentive, Engaged, and Receptive   Education Outcome:  Acknowledges education   Clinical Observations/Individualized Feedback: Chanetta Marshall was active in their participation of session activities and group discussion. Pt identified "I am good with computers and electronics". Pt spontaneously contributed to group discussion while interacting well with LRT and peers duration of session.    Plan: Continue to engage patient in RT group sessions 2-3x/week.   Rosina Lowenstein, LRT, CTRS 02/04/2023 4:39 PM

## 2023-02-04 NOTE — Plan of Care (Signed)
  Problem: Education: Goal: Knowledge of [facility] Education information/materials will improve Outcome: Progressing Goal: Emotional status will improve Outcome: Progressing Goal: Mental status will improve Outcome: Progressing Goal: Verbalization of understanding the information provided will improve Outcome: Progressing   

## 2023-02-04 NOTE — Progress Notes (Signed)
   02/03/23 1955  Psych Admission Type (Psych Patients Only)  Admission Status Voluntary  Psychosocial Assessment  Patient Complaints Anxiety  Eye Contact Darting  Facial Expression Flat  Affect Anxious  Speech Logical/coherent  Interaction Assertive  Motor Activity Slow  Appearance/Hygiene In scrubs  Behavior Characteristics Anxious  Mood Anxious  Thought Process  Coherency WDL  Content WDL  Delusions None reported or observed  Perception WDL  Hallucination None reported or observed  Judgment WDL  Confusion None  Danger to Self  Current suicidal ideation? Denies  Agreement Not to Harm Self Yes  Description of Agreement verbal  Danger to Others  Danger to Others None reported or observed

## 2023-02-04 NOTE — Group Note (Signed)
Date:  02/04/2023 Time:  11:04 AM  Group Topic/Focus:  Dimensions of Wellness:   The focus of this group is to introduce the topic of wellness and discuss the role each dimension of wellness plays in total health.    Participation Level:  Active  Participation Quality:  Appropriate  Affect:  Appropriate  Cognitive:  Appropriate  Insight: Appropriate  Engagement in Group:  Engaged  Modes of Intervention:  Discussion   Ardelle Anton 02/04/2023, 11:04 AM

## 2023-02-04 NOTE — Group Note (Signed)
Date:  02/04/2023 Time:  11:06 PM  Group Topic/Focus:  Goals Group:   The focus of this group is to help patients establish daily goals to achieve during treatment and discuss how the patient can incorporate goal setting into their daily lives to aide in recovery.    Participation Level:  Active  Participation Quality:  Appropriate  Affect:  Appropriate  Cognitive:  Appropriate  Insight: Appropriate  Engagement in Group:  Engaged  Modes of Intervention:  Discussion  Additional Comments:    Maeola Harman 02/04/2023, 11:06 PM

## 2023-02-05 DIAGNOSIS — F332 Major depressive disorder, recurrent severe without psychotic features: Secondary | ICD-10-CM | POA: Diagnosis not present

## 2023-02-05 MED ORDER — LOSARTAN POTASSIUM 25 MG PO TABS
100.0000 mg | ORAL_TABLET | Freq: Every day | ORAL | Status: DC
  Administered 2023-02-06 – 2023-02-09 (×4): 100 mg via ORAL
  Filled 2023-02-05 (×4): qty 4

## 2023-02-05 NOTE — Progress Notes (Signed)
   02/05/23 0930  Psych Admission Type (Psych Patients Only)  Admission Status Voluntary  Psychosocial Assessment  Patient Complaints None  Eye Contact Glaring  Facial Expression Blank  Affect Sullen  Speech Logical/coherent  Interaction Needy  Motor Activity Slow  Appearance/Hygiene Disheveled  Behavior Characteristics Cooperative  Mood Sullen  Thought Process  Coherency WDL  Content WDL  Delusions None reported or observed  Perception WDL  Hallucination None reported or observed  Judgment Impaired  Confusion None  Danger to Self  Current suicidal ideation? Denies  Danger to Others  Danger to Others None reported or observed

## 2023-02-05 NOTE — Plan of Care (Signed)
  Problem: Education: Goal: Knowledge of [facility] Education information/materials will improve Outcome: Progressing Goal: Emotional status will improve Outcome: Progressing Goal: Mental status will improve Outcome: Progressing Goal: Verbalization of understanding the information provided will improve Outcome: Progressing   Problem: Activity: Goal: Interest or engagement in activities will improve Outcome: Progressing Goal: Sleeping patterns will improve Outcome: Progressing   Problem: Coping: Goal: Ability to verbalize frustrations and anger appropriately will improve Outcome: Progressing Goal: Ability to demonstrate self-control will improve Outcome: Progressing   Problem: Health Behavior/Discharge Planning: Goal: Identification of resources available to assist in meeting health care needs will improve Outcome: Progressing Goal: Compliance with treatment plan for underlying cause of condition will improve Outcome: Progressing   Problem: Physical Regulation: Goal: Ability to maintain clinical measurements within normal limits will improve Outcome: Progressing   Problem: Safety: Goal: Periods of time without injury will increase Outcome: Progressing   Problem: Education: Goal: Utilization of techniques to improve thought processes will improve Outcome: Progressing Goal: Knowledge of the prescribed therapeutic regimen will improve Outcome: Progressing   Problem: Activity: Goal: Interest or engagement in leisure activities will improve Outcome: Progressing Goal: Imbalance in normal sleep/wake cycle will improve Outcome: Progressing   Problem: Coping: Goal: Coping ability will improve Outcome: Progressing Goal: Will verbalize feelings Outcome: Progressing   Problem: Health Behavior/Discharge Planning: Goal: Ability to make decisions will improve Outcome: Progressing Goal: Compliance with therapeutic regimen will improve Outcome: Progressing    Problem: Role Relationship: Goal: Will demonstrate positive changes in social behaviors and relationships Outcome: Progressing   Problem: Safety: Goal: Ability to disclose and discuss suicidal ideas will improve Outcome: Progressing Goal: Ability to identify and utilize support systems that promote safety will improve Outcome: Progressing   Problem: Self-Concept: Goal: Will verbalize positive feelings about self Outcome: Progressing Goal: Level of anxiety will decrease Outcome: Progressing   Problem: Education: Goal: Ability to incorporate positive changes in behavior to improve self-esteem will improve Outcome: Progressing   Problem: Health Behavior/Discharge Planning: Goal: Ability to identify and utilize available resources and services will improve Outcome: Progressing Goal: Ability to remain free from injury will improve Outcome: Progressing   Problem: Self-Concept: Goal: Will verbalize positive feelings about self Outcome: Progressing   Problem: Skin Integrity: Goal: Demonstration of wound healing without infection will improve Outcome: Progressing   Problem: Education: Goal: Ability to state activities that reduce stress will improve Outcome: Progressing

## 2023-02-05 NOTE — Progress Notes (Signed)
[facility] MD Progress Note  02/05/2023 11:31 AM Travis [name]  MRN:  [id number] Subjective: Travis [name] is seen on rounds.  He is doing better.  He has been in good controls.  He tells me that he is going to live with his older sister and that she will pick him up on Friday.  That is new news.  He has been compliant with medications and no problems.  His mood seems to be improving.  He denies any suicidal ideation although if discharged to a shelter that might change the outlook.  It is good news that his older sister has entered the picture. Principal Problem: Major depressive disorder, recurrent episode, severe (HCC) Diagnosis: Principal Problem:   Major depressive disorder, recurrent episode, severe (HCC) Active Problems:   Methamphetamine abuse (HCC)   Mild tetrahydrocannabinol (THC) abuse  Total Time spent with patient: 15 minutes  Past Psychiatric History: Polysubstance abuse and depression  Past Medical History:  Past Medical History:  Diagnosis Date   Anxiety    Panic attacks   Chest pain    Hospital, March, 2014, negative enzymes, patient refused in-hospital  stress test, patient canceled outpatient stress test   Constipation    Degenerative disk disease    Degenerative disk disease    Depression    GERD (gastroesophageal reflux disease)    Hypertension    Incontinence of urine    Neuromuscular disorder (HCC)    Obesity    Polysubstance abuse (HCC)    PTSD (post-traumatic stress disorder)    Pulmonary emboli (HCC)    Seizures (HCC)    Shortness of breath dyspnea    with exertion   Spinal stenosis    Spinal stenosis    Suicide attempt (HCC)    Tachycardia - pulse    Tobacco abuse     Past Surgical History:  Procedure Laterality Date   COLONOSCOPY N/A 08/24/2014   Procedure: COLONOSCOPY;  Surgeon: Charlott Rakes, MD;  Location: [facility] ENDOSCOPY;  Service: Endoscopy;  Laterality: N/A;   ESOPHAGOGASTRODUODENOSCOPY (EGD) WITH PROPOFOL N/A 08/24/2014   Procedure:  ESOPHAGOGASTRODUODENOSCOPY (EGD) WITH PROPOFOL;  Surgeon: Charlott Rakes, MD;  Location: [facility] ENDOSCOPY;  Service: Endoscopy;  Laterality: N/A;   WISDOM TOOTH EXTRACTION     Family History:  Family History  Problem Relation Age of Onset   Stroke Mother    Hypertension Mother    Hyperlipidemia Mother    Stroke Father    Hypertension Father    Dementia Father    Diabetes Father    Heart disease Father    Hyperlipidemia Father    Cancer Sister        brain   Diabetes Sister    Heart disease Sister    Hyperlipidemia Sister    Hypertension Sister    Stroke Sister    Heart disease Brother    Hyperlipidemia Brother    Family Psychiatric  History: Unremarkable Social History:  Social History   Substance and Sexual Activity  Alcohol Use No     Social History   Substance and Sexual Activity  Drug Use Not Currently   Types: Amphetamines, Marijuana, Benzodiazepines    Social History   Socioeconomic History   Marital status: Divorced    Spouse name: Not on file   Number of children: Not on file   Years of education: Not on file   Highest education level: Not on file  Occupational History   Not on file  Tobacco Use   Smoking status: Every Day    Current  packs/day: 0.00    Types: Cigarettes    Start date: 12/12/2016    Last attempt to quit: 12/12/2016    Years since quitting: 6.1   Smokeless tobacco: Never  Vaping Use   Vaping status: Every Day   Substances: Nicotine  Substance and Sexual Activity   Alcohol use: No   Drug use: Not Currently    Types: Amphetamines, Marijuana, Benzodiazepines   Sexual activity: Never  Other Topics Concern   Not on file  Social History Narrative   Not on file   Social Determinants of Health   Financial Resource Strain: High Risk (01/09/2023)   Received from [facility]   Overall Financial Resource Strain (CARDIA)    Difficulty of Paying Living Expenses: Hard  Food Insecurity: Food Insecurity Present (01/23/2023)   Hunger  Vital Sign    Worried About Running Out of Food in the Last Year: Sometimes true    Ran Out of Food in the Last Year: Sometimes true  Transportation Needs: Unmet Transportation Needs (01/23/2023)   PRAPARE - Administrator, Civil Service (Medical): Yes    Lack of Transportation (Non-Medical): Yes  Physical Activity: Inactive (01/09/2023)   Received from [facility]   Exercise Vital Sign    Days of Exercise per Week: 0 days    Minutes of Exercise per Session: 10 min  Stress: Stress Concern Present (01/09/2023)   Received from [facility] - Occupational Stress Questionnaire    Feeling of Stress : Rather much  Social Connections: Moderately Integrated (01/09/2023)   Received from [facility]   Social Network    How would you rate your social network (family, work, friends)?: Adequate participation with social networks   Additional Social History:                         Sleep: Good  Appetite:  Good  Current Medications: Current Facility-Administered Medications  Medication Dose Route Frequency Provider Last Rate Last Admin   acetaminophen (TYLENOL) tablet 650 mg  650 mg Oral Q6H PRN Maryagnes Amos, FNP   650 mg at 02/05/23 0515   alum & mag hydroxide-simeth (MAALOX/MYLANTA) 200-200-20 MG/5ML suspension 30 mL  30 mL Oral Q4H PRN Maryagnes Amos, FNP       amitriptyline (ELAVIL) tablet 25 mg  25 mg Oral QHS Sarina Ill, DO   25 mg at 02/04/23 2141   cloNIDine (CATAPRES) tablet 0.1 mg  0.1 mg Oral BID Sarina Ill, DO   0.1 mg at 02/05/23 0946   cyclobenzaprine (FLEXERIL) tablet 10 mg  10 mg Oral TID PRN Sarina Ill, DO   10 mg at 02/05/23 0945   escitalopram (LEXAPRO) tablet 20 mg  20 mg Oral QPC breakfast Sarina Ill, DO   20 mg at 02/05/23 0946   famotidine (PEPCID) tablet 20 mg  20 mg Oral Daily PRN Maryagnes Amos, FNP   20 mg at 01/24/23 0804    gabapentin (NEURONTIN) capsule 900 mg  900 mg Oral TID Lewanda Rife, MD   900 mg at 02/05/23 0946   loperamide (IMODIUM) capsule 4 mg  4 mg Oral PRN Sarina Ill, DO   4 mg at 01/26/23 0810   LORazepam (ATIVAN) tablet 0.5 mg  0.5 mg Oral Q6H PRN Lewanda Rife, MD   0.5 mg at 02/05/23 0945   losartan (COZAAR) tablet 50 mg  50 mg Oral QPC breakfast  Sarina Ill, DO   50 mg at 02/05/23 0946   magnesium hydroxide (MILK OF MAGNESIA) suspension 30 mL  30 mL Oral Daily PRN Maryagnes Amos, FNP       metFORMIN (GLUCOPHAGE-XR) 24 hr tablet 500 mg  500 mg Oral Q breakfast Maryagnes Amos, FNP   500 mg at 02/05/23 0945   metoprolol succinate (TOPROL-XL) 24 hr tablet 50 mg  50 mg Oral Daily Sarina Ill, DO   50 mg at 02/05/23 0946   nicotine (NICODERM CQ - dosed in mg/24 hours) patch 14 mg  14 mg Transdermal Daily Sarina Ill, DO   14 mg at 02/05/23 1015   OLANZapine (ZYPREXA) tablet 10 mg  10 mg Oral Q6H PRN Sarina Ill, DO       pantoprazole (PROTONIX) EC tablet 40 mg  40 mg Oral Daily Maryagnes Amos, FNP   40 mg at 02/05/23 0946   QUEtiapine (SEROQUEL) tablet 300 mg  300 mg Oral QHS Sarina Ill, DO   300 mg at 02/04/23 2141   traMADol (ULTRAM) tablet 50 mg  50 mg Oral Q12H PRN Lewanda Rife, MD   50 mg at 02/05/23 0946    Lab Results: No results found for this or any previous visit (from the past 48 hour(s)).  Blood Alcohol level:  Lab Results  Component Value Date   ETH <10 01/20/2023   ETH <10 02/28/2018    Metabolic Disorder Labs: Lab Results  Component Value Date   HGBA1C 5.8 (H) 01/31/2023   MPG 119.76 01/31/2023   MPG 123 (H) 05/29/2012   No results found for: "PROLACTIN" Lab Results  Component Value Date   CHOL 140 02/03/2023   TRIG 174 (H) 02/03/2023   HDL 30 (L) 02/03/2023   CHOLHDL 4.7 02/03/2023   VLDL 35 02/03/2023   LDLCALC 75 02/03/2023   LDLCALC 73 05/30/2012     Physical Findings: AIMS:  , ,  ,  ,    CIWA:    COWS:     Musculoskeletal: Strength & Muscle Tone: within normal limits Gait & Station: normal Patient leans: N/A  Psychiatric Specialty Exam:  Presentation  General Appearance:  Appropriate for Environment  Eye Contact: Good  Speech: Clear and Coherent; Normal Rate  Speech Volume: Normal  Handedness: Right   Mood and Affect  Mood: Anxious; Dysphoric  Affect: Congruent   Thought Process  Thought Processes: Coherent; Goal Directed  Descriptions of Associations:Intact  Orientation:Full (Time, Place and Person)  Thought Content:Logical; WDL  History of Schizophrenia/Schizoaffective disorder:No  Duration of Psychotic Symptoms:Greater than six months  Hallucinations:No data recorded Ideas of Reference:None  Suicidal Thoughts:No data recorded Homicidal Thoughts:No data recorded  Sensorium  Memory: Immediate Fair; Recent Good  Judgment: Intact  Insight: Fair; Present   Executive Functions  Concentration: Good  Attention Span: Good  Recall: Good  Fund of Knowledge: Good  Language: Good   Psychomotor Activity  Psychomotor Activity:No data recorded  Assets  Assets: Communication Skills; Desire for Improvement; Financial Resources/Insurance; Leisure Time; Physical Health   Sleep  Sleep:No data recorded    Blood pressure (!) 145/100, pulse 62, temperature (!) 97.2 F (36.2 C), resp. rate 16, height 6' (1.829 m), weight 117 kg, SpO2 96%. Body mass index is 34.99 kg/m.   Treatment Plan Summary: Daily contact with patient to assess and evaluate symptoms and progress in treatment, Medication management, and Plan continue current medication.  Sarina Ill, DO 02/05/2023, 11:31 AM

## 2023-02-05 NOTE — Plan of Care (Signed)
  Problem: Education: Goal: Knowledge of [facility] Education information/materials will improve Outcome: Progressing Goal: Emotional status will improve Outcome: Progressing Goal: Mental status will improve Outcome: Progressing Goal: Verbalization of understanding the information provided will improve Outcome: Progressing   

## 2023-02-06 DIAGNOSIS — F332 Major depressive disorder, recurrent severe without psychotic features: Secondary | ICD-10-CM | POA: Diagnosis not present

## 2023-02-06 NOTE — Plan of Care (Signed)
D: Pt alert and oriented. Pt reports experiencing anxiety/depression at this time. Pt reports experiencing chronic 10/10 back pain at this time, prn medications given. Pt denies experiencing any SI/HI, or AVH at this time.   A: Scheduled medications administered to pt, per MD orders. Support and encouragement provided. Frequent verbal contact made. Routine safety checks conducted q15 minutes.   R: No adverse drug reactions noted. Pt verbally contracts for safety at this time. Pt compliant with medications and treatment plan. Pt interacts well with others on the unit. Pt remains safe at this time. Plan of care ongoing.  Problem: Education: Goal: Knowledge of [facility] Education information/materials will improve Outcome: Progressing   Problem: Activity: Goal: Interest or engagement in activities will improve Outcome: Progressing

## 2023-02-06 NOTE — Plan of Care (Signed)
?  Problem: Activity: ?Goal: Interest or engagement in activities will improve ?Outcome: Progressing ?Goal: Sleeping patterns will improve ?Outcome: Progressing ?  ?Problem: Coping: ?Goal: Ability to verbalize frustrations and anger appropriately will improve ?Outcome: Progressing ?Goal: Ability to demonstrate self-control will improve ?Outcome: Progressing ?  ?Problem: Safety: ?Goal: Periods of time without injury will increase ?Outcome: Progressing ?  ?

## 2023-02-06 NOTE — Group Note (Signed)
Recreation Therapy Group Note   Group Topic:Relaxation  Group Date: 02/06/2023 Start Time: 1400 End Time: 1440 Facilitators: Rosina Lowenstein, LRT Location:  Dayroom  Group Description: Meditation. LRT and patients discussed what they know about meditation and mindfulness. LRT played a Deep Breathing Meditation exercise script for patients to follow along to. LRT and patients discussed how meditation and deep breathing can be used as a coping skill post--discharge to help manage symptoms of stress. Afterwards, LRT gave pts the option to go outside for fresh air and sunlight, briefly.   Goal Area(s) Addressed: Patient will practice using relaxation technique. Patient will identify a new coping skill.  Patient will follow multistep directions to reduce anxiety and stress.   Affect/Mood: N/A   Participation Level: Did not attend    Clinical Observations/Individualized Feedback: Patient did not attend.  Plan: Continue to engage patient in RT group sessions 2-3x/week.   Rosina Lowenstein, LRT, CTRS 02/07/2023 1:30 PM

## 2023-02-06 NOTE — Group Note (Signed)
Date:  02/06/2023 Time:  8:27 PM  Group Topic/Focus:  Building Self Esteem:   The Focus of this group is helping patients become aware of the effects of self-esteem on their lives, the things they and others do that enhance or undermine their self-esteem, seeing the relationship between their level of self-esteem and the choices they make and learning ways to enhance self-esteem.    Participation Level:  Active  Participation Quality:  Appropriate  Affect:  Appropriate  Cognitive:  Appropriate  Insight: Appropriate  Engagement in Group:  Engaged  Modes of Intervention:  Discussion  Additional Comments:    Burt Ek 02/06/2023, 8:27 PM

## 2023-02-06 NOTE — Progress Notes (Signed)
[facility] MD Progress Note  02/06/2023 10:42 AM Travis [name]  MRN:  [id number] Subjective: Travis [name] is seen on rounds.  His blood pressure is still high but he is being discharged tomorrow and his sisters could not pick him up.  Not to mess with his blood pressure medication right now.  I told him he needs to go see his primary care doctor.  His mood and affect have improved.  He is doing well on his psychiatric medications.  He currently denies any suicidal ideation.  He smiles when he says he is leaving tomorrow. Principal Problem: Major depressive disorder, recurrent episode, severe (HCC) Diagnosis: Principal Problem:   Major depressive disorder, recurrent episode, severe (HCC) Active Problems:   Methamphetamine abuse (HCC)   Mild tetrahydrocannabinol (THC) abuse  Total Time spent with patient: 15 minutes  Past Psychiatric History: Polysubstance abuse and depression  Past Medical History:  Past Medical History:  Diagnosis Date   Anxiety    Panic attacks   Chest pain    Hospital, March, 2014, negative enzymes, patient refused in-hospital  stress test, patient canceled outpatient stress test   Constipation    Degenerative disk disease    Degenerative disk disease    Depression    GERD (gastroesophageal reflux disease)    Hypertension    Incontinence of urine    Neuromuscular disorder (HCC)    Obesity    Polysubstance abuse (HCC)    PTSD (post-traumatic stress disorder)    Pulmonary emboli (HCC)    Seizures (HCC)    Shortness of breath dyspnea    with exertion   Spinal stenosis    Spinal stenosis    Suicide attempt (HCC)    Tachycardia - pulse    Tobacco abuse     Past Surgical History:  Procedure Laterality Date   COLONOSCOPY N/A 08/24/2014   Procedure: COLONOSCOPY;  Surgeon: Charlott Rakes, MD;  Location: [facility] ENDOSCOPY;  Service: Endoscopy;  Laterality: N/A;   ESOPHAGOGASTRODUODENOSCOPY (EGD) WITH PROPOFOL N/A 08/24/2014   Procedure: ESOPHAGOGASTRODUODENOSCOPY (EGD) WITH  PROPOFOL;  Surgeon: Charlott Rakes, MD;  Location: [facility] ENDOSCOPY;  Service: Endoscopy;  Laterality: N/A;   WISDOM TOOTH EXTRACTION     Family History:  Family History  Problem Relation Age of Onset   Stroke Mother    Hypertension Mother    Hyperlipidemia Mother    Stroke Father    Hypertension Father    Dementia Father    Diabetes Father    Heart disease Father    Hyperlipidemia Father    Cancer Sister        brain   Diabetes Sister    Heart disease Sister    Hyperlipidemia Sister    Hypertension Sister    Stroke Sister    Heart disease Brother    Hyperlipidemia Brother    Family Psychiatric  History: Unremarkable Social History:  Social History   Substance and Sexual Activity  Alcohol Use No     Social History   Substance and Sexual Activity  Drug Use Not Currently   Types: Amphetamines, Marijuana, Benzodiazepines    Social History   Socioeconomic History   Marital status: Divorced    Spouse name: Not on file   Number of children: Not on file   Years of education: Not on file   Highest education level: Not on file  Occupational History   Not on file  Tobacco Use   Smoking status: Every Day    Current packs/day: 0.00    Types: Cigarettes  Start date: 12/12/2016    Last attempt to quit: 12/12/2016    Years since quitting: 6.1   Smokeless tobacco: Never  Vaping Use   Vaping status: Every Day   Substances: Nicotine  Substance and Sexual Activity   Alcohol use: No   Drug use: Not Currently    Types: Amphetamines, Marijuana, Benzodiazepines   Sexual activity: Never  Other Topics Concern   Not on file  Social History Narrative   Not on file   Social Determinants of Health   Financial Resource Strain: High Risk (01/09/2023)   Received from [facility]   Overall Financial Resource Strain (CARDIA)    Difficulty of Paying Living Expenses: Hard  Food Insecurity: Food Insecurity Present (01/23/2023)   Hunger Vital Sign    Worried About Running Out  of Food in the Last Year: Sometimes true    Ran Out of Food in the Last Year: Sometimes true  Transportation Needs: Unmet Transportation Needs (01/23/2023)   PRAPARE - Administrator, Civil Service (Medical): Yes    Lack of Transportation (Non-Medical): Yes  Physical Activity: Inactive (01/09/2023)   Received from [facility]   Exercise Vital Sign    Days of Exercise per Week: 0 days    Minutes of Exercise per Session: 10 min  Stress: Stress Concern Present (01/09/2023)   Received from [facility] - Occupational Stress Questionnaire    Feeling of Stress : Rather much  Social Connections: Moderately Integrated (01/09/2023)   Received from [facility]   Social Network    How would you rate your social network (family, work, friends)?: Adequate participation with social networks   Additional Social History:                         Sleep: Good  Appetite:  Good  Current Medications: Current Facility-Administered Medications  Medication Dose Route Frequency Provider Last Rate Last Admin   acetaminophen (TYLENOL) tablet 650 mg  650 mg Oral Q6H PRN Maryagnes Amos, FNP   650 mg at 02/06/23 0939   alum & mag hydroxide-simeth (MAALOX/MYLANTA) 200-200-20 MG/5ML suspension 30 mL  30 mL Oral Q4H PRN Maryagnes Amos, FNP       amitriptyline (ELAVIL) tablet 25 mg  25 mg Oral QHS Sarina Ill, DO   25 mg at 02/05/23 2120   cloNIDine (CATAPRES) tablet 0.1 mg  0.1 mg Oral BID Sarina Ill, DO   0.1 mg at 02/06/23 0935   cyclobenzaprine (FLEXERIL) tablet 10 mg  10 mg Oral TID PRN Sarina Ill, DO   10 mg at 02/05/23 0945   escitalopram (LEXAPRO) tablet 20 mg  20 mg Oral QPC breakfast Sarina Ill, DO   20 mg at 02/06/23 0934   famotidine (PEPCID) tablet 20 mg  20 mg Oral Daily PRN Maryagnes Amos, FNP   20 mg at 01/24/23 0804   gabapentin (NEURONTIN) capsule 900 mg   900 mg Oral TID Lewanda Rife, MD   900 mg at 02/06/23 0934   loperamide (IMODIUM) capsule 4 mg  4 mg Oral PRN Sarina Ill, DO   4 mg at 01/26/23 0810   LORazepam (ATIVAN) tablet 0.5 mg  0.5 mg Oral Q6H PRN Lewanda Rife, MD   0.5 mg at 02/05/23 0945   losartan (COZAAR) tablet 100 mg  100 mg Oral QPC breakfast Sarina Ill, DO   100 mg at 02/06/23  0935   magnesium hydroxide (MILK OF MAGNESIA) suspension 30 mL  30 mL Oral Daily PRN Rosario Adie, Juel Burrow, FNP       metFORMIN (GLUCOPHAGE-XR) 24 hr tablet 500 mg  500 mg Oral Q breakfast Maryagnes Amos, FNP   500 mg at 02/06/23 0935   metoprolol succinate (TOPROL-XL) 24 hr tablet 50 mg  50 mg Oral Daily Sarina Ill, DO   50 mg at 02/06/23 3086   nicotine (NICODERM CQ - dosed in mg/24 hours) patch 14 mg  14 mg Transdermal Daily Sarina Ill, DO   14 mg at 02/06/23 0940   OLANZapine (ZYPREXA) tablet 10 mg  10 mg Oral Q6H PRN Sarina Ill, DO       pantoprazole (PROTONIX) EC tablet 40 mg  40 mg Oral Daily Maryagnes Amos, FNP   40 mg at 02/06/23 0936   QUEtiapine (SEROQUEL) tablet 300 mg  300 mg Oral QHS Sarina Ill, DO   300 mg at 02/05/23 2122   traMADol (ULTRAM) tablet 50 mg  50 mg Oral Q12H PRN Lewanda Rife, MD   50 mg at 02/06/23 1035    Lab Results: No results found for this or any previous visit (from the past 48 hour(s)).  Blood Alcohol level:  Lab Results  Component Value Date   ETH <10 01/20/2023   ETH <10 02/28/2018    Metabolic Disorder Labs: Lab Results  Component Value Date   HGBA1C 5.8 (H) 01/31/2023   MPG 119.76 01/31/2023   MPG 123 (H) 05/29/2012   No results found for: "PROLACTIN" Lab Results  Component Value Date   CHOL 140 02/03/2023   TRIG 174 (H) 02/03/2023   HDL 30 (L) 02/03/2023   CHOLHDL 4.7 02/03/2023   VLDL 35 02/03/2023   LDLCALC 75 02/03/2023   LDLCALC 73 05/30/2012    Physical Findings: AIMS:  , ,  ,  ,     CIWA:    COWS:     Musculoskeletal: Strength & Muscle Tone: within normal limits Gait & Station: normal Patient leans: Backward  Psychiatric Specialty Exam:  Presentation  General Appearance:  Appropriate for Environment  Eye Contact: Good  Speech: Clear and Coherent; Normal Rate  Speech Volume: Normal  Handedness: Right   Mood and Affect  Mood: Anxious; Dysphoric  Affect: Congruent   Thought Process  Thought Processes: Coherent; Goal Directed  Descriptions of Associations:Intact  Orientation:Full (Time, Place and Person)  Thought Content:Logical; WDL  History of Schizophrenia/Schizoaffective disorder:No  Duration of Psychotic Symptoms:Greater than six months  Hallucinations:No data recorded Ideas of Reference:None  Suicidal Thoughts:No data recorded Homicidal Thoughts:No data recorded  Sensorium  Memory: Immediate Fair; Recent Good  Judgment: Intact  Insight: Fair; Present   Executive Functions  Concentration: Good  Attention Span: Good  Recall: Good  Fund of Knowledge: Good  Language: Good   Psychomotor Activity  Psychomotor Activity:No data recorded  Assets  Assets: Communication Skills; Desire for Improvement; Financial Resources/Insurance; Leisure Time; Physical Health   Sleep  Sleep:No data recorded    Blood pressure (!) 145/95, pulse 64, temperature 98.4 F (36.9 C), resp. rate 16, height 6' (1.829 m), weight 117 kg, SpO2 95%. Body mass index is 34.99 kg/m.   Treatment Plan Summary: Daily contact with patient to assess and evaluate symptoms and progress in treatment, Medication management, and Plan continue current medication.  Discharge tomorrow.  Sarina Ill, DO 02/06/2023, 10:42 AM

## 2023-02-06 NOTE — BHH Counselor (Signed)
CSW contacted [facility] CDIOP, Myrna Blazer at [phone] to inquire about intensive outpatient per pt's request.   CSW left HIPAA compliant VM requesting return call.   Reynaldo Minium, MSW, [place] 02/06/2023 10:16 AM

## 2023-02-06 NOTE — Progress Notes (Signed)
Calm and cooperative. C/o back pain. Pain scale 8/10. Ultram 50 mg po given PRN for pain with relief. Po medications given as scheduled. Tol well. No further complaints offered. Q 15 min checks provided. Denies SI/HI/AVH.

## 2023-02-06 NOTE — Group Note (Signed)
LCSW Group Therapy Note  Group Date: 02/06/2023 Start Time: 1500 End Time: 1600   Type of Therapy and Topic:  Group Therapy - Healthy vs Unhealthy Coping Skills  Participation Level:  Did Not Attend   Description of Group The focus of this group was to determine what unhealthy coping techniques typically are used by group members and what healthy coping techniques would be helpful in coping with various problems. Patients were guided in becoming aware of the differences between healthy and unhealthy coping techniques. Patients were asked to identify 2-3 healthy coping skills they would like to learn to use more effectively.  Therapeutic Goals Patients learned that coping is what human beings do all day long to deal with various situations in their lives Patients defined and discussed healthy vs unhealthy coping techniques Patients identified their preferred coping techniques and identified whether these were healthy or unhealthy Patients determined 2-3 healthy coping skills they would like to become more familiar with and use more often. Patients provided support and ideas to each other   Summary of Patient Progress:  X   Therapeutic Modalities Cognitive Behavioral Therapy Motivational Interviewing  Elza Rafter, [place] 02/06/2023  4:19 PM

## 2023-02-06 NOTE — Group Note (Signed)
Date:  02/06/2023 Time:  1:35 PM  Group Topic/Focus:  Movement Therapy    Participation Level:  Active  Participation Quality:  Appropriate  Affect:  Appropriate  Cognitive:  Appropriate  Insight: Appropriate  Engagement in Group:  Engaged  Modes of Intervention:  Activity  Additional Comments:  none  [name] 02/06/2023, 1:35 PM

## 2023-02-07 DIAGNOSIS — F332 Major depressive disorder, recurrent severe without psychotic features: Secondary | ICD-10-CM | POA: Diagnosis not present

## 2023-02-07 NOTE — Group Note (Unsigned)
Date:  02/07/2023 Time:  10:19 PM  Group Topic/Focus:  Healthy Communication:   The focus of this group is to discuss communication, barriers to communication, as well as healthy ways to communicate with others.     Participation Level:  {BHH PARTICIPATION CZYSA:63016}  Participation Quality:  {BHH PARTICIPATION QUALITY:22265}  Affect:  {BHH AFFECT:22266}  Cognitive:  {BHH COGNITIVE:22267}  Insight: {BHH Insight2:20797}  Engagement in Group:  {BHH ENGAGEMENT IN WFUXN:23557}  Modes of Intervention:  {BHH MODES OF INTERVENTION:22269}  Additional Comments:  ***  Burt Ek 02/07/2023, 10:19 PM

## 2023-02-07 NOTE — Progress Notes (Signed)
[facility] MD Progress Note  02/07/2023 11:10 AM Travis [name]  MRN:  [id number] Subjective: Travis [name] is seen on rounds.  He says he got his days mixed up and that his sisters go to pick him up tomorrow and not today.  He is doing well on his current medications.  I talked to him about his blood pressure and told him that I did not want to start messing with his blood pressure medications before he leaves and he says that he has a primary care doctor that he is going to go see.  He denies any suicidal ideation.  He denies any side effects from his medications. Principal Problem: Major depressive disorder, recurrent episode, severe (HCC) Diagnosis: Principal Problem:   Major depressive disorder, recurrent episode, severe (HCC) Active Problems:   Methamphetamine abuse (HCC)   Mild tetrahydrocannabinol (THC) abuse  Total Time spent with patient: 15 minutes  Past Psychiatric History: History of polysubstance abuse and depression  Past Medical History:  Past Medical History:  Diagnosis Date   Anxiety    Panic attacks   Chest pain    Hospital, March, 2014, negative enzymes, patient refused in-hospital  stress test, patient canceled outpatient stress test   Constipation    Degenerative disk disease    Degenerative disk disease    Depression    GERD (gastroesophageal reflux disease)    Hypertension    Incontinence of urine    Neuromuscular disorder (HCC)    Obesity    Polysubstance abuse (HCC)    PTSD (post-traumatic stress disorder)    Pulmonary emboli (HCC)    Seizures (HCC)    Shortness of breath dyspnea    with exertion   Spinal stenosis    Spinal stenosis    Suicide attempt (HCC)    Tachycardia - pulse    Tobacco abuse     Past Surgical History:  Procedure Laterality Date   COLONOSCOPY N/A 08/24/2014   Procedure: COLONOSCOPY;  Surgeon: Charlott Rakes, MD;  Location: [facility] ENDOSCOPY;  Service: Endoscopy;  Laterality: N/A;   ESOPHAGOGASTRODUODENOSCOPY (EGD) WITH PROPOFOL N/A 08/24/2014    Procedure: ESOPHAGOGASTRODUODENOSCOPY (EGD) WITH PROPOFOL;  Surgeon: Charlott Rakes, MD;  Location: [facility] ENDOSCOPY;  Service: Endoscopy;  Laterality: N/A;   WISDOM TOOTH EXTRACTION     Family History:  Family History  Problem Relation Age of Onset   Stroke Mother    Hypertension Mother    Hyperlipidemia Mother    Stroke Father    Hypertension Father    Dementia Father    Diabetes Father    Heart disease Father    Hyperlipidemia Father    Cancer Sister        brain   Diabetes Sister    Heart disease Sister    Hyperlipidemia Sister    Hypertension Sister    Stroke Sister    Heart disease Brother    Hyperlipidemia Brother    Family Psychiatric  History: Unremarkable Social History:  Social History   Substance and Sexual Activity  Alcohol Use No     Social History   Substance and Sexual Activity  Drug Use Not Currently   Types: Amphetamines, Marijuana, Benzodiazepines    Social History   Socioeconomic History   Marital status: Divorced    Spouse name: Not on file   Number of children: Not on file   Years of education: Not on file   Highest education level: Not on file  Occupational History   Not on file  Tobacco Use   Smoking status:  Every Day    Current packs/day: 0.00    Types: Cigarettes    Start date: 12/12/2016    Last attempt to quit: 12/12/2016    Years since quitting: 6.1   Smokeless tobacco: Never  Vaping Use   Vaping status: Every Day   Substances: Nicotine  Substance and Sexual Activity   Alcohol use: No   Drug use: Not Currently    Types: Amphetamines, Marijuana, Benzodiazepines   Sexual activity: Never  Other Topics Concern   Not on file  Social History Narrative   Not on file   Social Determinants of Health   Financial Resource Strain: High Risk (01/09/2023)   Received from [facility]   Overall Financial Resource Strain (CARDIA)    Difficulty of Paying Living Expenses: Hard  Food Insecurity: Food Insecurity Present (01/23/2023)    Hunger Vital Sign    Worried About Running Out of Food in the Last Year: Sometimes true    Ran Out of Food in the Last Year: Sometimes true  Transportation Needs: Unmet Transportation Needs (01/23/2023)   PRAPARE - Administrator, Civil Service (Medical): Yes    Lack of Transportation (Non-Medical): Yes  Physical Activity: Inactive (01/09/2023)   Received from [facility]   Exercise Vital Sign    Days of Exercise per Week: 0 days    Minutes of Exercise per Session: 10 min  Stress: Stress Concern Present (01/09/2023)   Received from [facility] - Occupational Stress Questionnaire    Feeling of Stress : Rather much  Social Connections: Moderately Integrated (01/09/2023)   Received from [facility]   Social Network    How would you rate your social network (family, work, friends)?: Adequate participation with social networks   Additional Social History:                         Sleep: Good  Appetite:  Good  Current Medications: Current Facility-Administered Medications  Medication Dose Route Frequency Provider Last Rate Last Admin   acetaminophen (TYLENOL) tablet 650 mg  650 mg Oral Q6H PRN Maryagnes Amos, FNP   650 mg at 02/07/23 0939   alum & mag hydroxide-simeth (MAALOX/MYLANTA) 200-200-20 MG/5ML suspension 30 mL  30 mL Oral Q4H PRN Maryagnes Amos, FNP       amitriptyline (ELAVIL) tablet 25 mg  25 mg Oral QHS Sarina Ill, DO   25 mg at 02/06/23 2130   cloNIDine (CATAPRES) tablet 0.1 mg  0.1 mg Oral BID Sarina Ill, DO   0.1 mg at 02/07/23 0940   cyclobenzaprine (FLEXERIL) tablet 10 mg  10 mg Oral TID PRN Sarina Ill, DO   10 mg at 02/06/23 1215   escitalopram (LEXAPRO) tablet 20 mg  20 mg Oral QPC breakfast Sarina Ill, DO   20 mg at 02/07/23 0940   famotidine (PEPCID) tablet 20 mg  20 mg Oral Daily PRN Maryagnes Amos, FNP   20 mg at 01/24/23  0804   gabapentin (NEURONTIN) capsule 900 mg  900 mg Oral TID Lewanda Rife, MD   900 mg at 02/07/23 0940   loperamide (IMODIUM) capsule 4 mg  4 mg Oral PRN Sarina Ill, DO   4 mg at 01/26/23 0810   LORazepam (ATIVAN) tablet 0.5 mg  0.5 mg Oral Q6H PRN Lewanda Rife, MD   0.5 mg at 02/05/23 0945   losartan (COZAAR) tablet 100 mg  100 mg Oral QPC breakfast Sarina Ill, DO   100 mg at 02/07/23 0940   magnesium hydroxide (MILK OF MAGNESIA) suspension 30 mL  30 mL Oral Daily PRN Maryagnes Amos, FNP       metFORMIN (GLUCOPHAGE-XR) 24 hr tablet 500 mg  500 mg Oral Q breakfast Maryagnes Amos, FNP   500 mg at 02/07/23 0940   metoprolol succinate (TOPROL-XL) 24 hr tablet 50 mg  50 mg Oral Daily Sarina Ill, DO   50 mg at 02/07/23 0940   nicotine (NICODERM CQ - dosed in mg/24 hours) patch 14 mg  14 mg Transdermal Daily Sarina Ill, DO   14 mg at 02/07/23 0943   OLANZapine (ZYPREXA) tablet 10 mg  10 mg Oral Q6H PRN Sarina Ill, DO       pantoprazole (PROTONIX) EC tablet 40 mg  40 mg Oral Daily Maryagnes Amos, FNP   40 mg at 02/07/23 0940   QUEtiapine (SEROQUEL) tablet 300 mg  300 mg Oral QHS Sarina Ill, DO   300 mg at 02/06/23 2130   traMADol (ULTRAM) tablet 50 mg  50 mg Oral Q12H PRN Lewanda Rife, MD   50 mg at 02/07/23 1037    Lab Results: No results found for this or any previous visit (from the past 48 hour(s)).  Blood Alcohol level:  Lab Results  Component Value Date   ETH <10 01/20/2023   ETH <10 02/28/2018    Metabolic Disorder Labs: Lab Results  Component Value Date   HGBA1C 5.8 (H) 01/31/2023   MPG 119.76 01/31/2023   MPG 123 (H) 05/29/2012   No results found for: "PROLACTIN" Lab Results  Component Value Date   CHOL 140 02/03/2023   TRIG 174 (H) 02/03/2023   HDL 30 (L) 02/03/2023   CHOLHDL 4.7 02/03/2023   VLDL 35 02/03/2023   LDLCALC 75 02/03/2023   LDLCALC 73  05/30/2012    Physical Findings: AIMS:  , ,  ,  ,    CIWA:    COWS:     Musculoskeletal: Strength & Muscle Tone: within normal limits Gait & Station: normal Patient leans: N/A  Psychiatric Specialty Exam:  Presentation  General Appearance:  Appropriate for Environment  Eye Contact: Good  Speech: Clear and Coherent; Normal Rate  Speech Volume: Normal  Handedness: Right   Mood and Affect  Mood: Anxious; Dysphoric  Affect: Congruent   Thought Process  Thought Processes: Coherent; Goal Directed  Descriptions of Associations:Intact  Orientation:Full (Time, Place and Person)  Thought Content:Logical; WDL  History of Schizophrenia/Schizoaffective disorder:No  Duration of Psychotic Symptoms:Greater than six months  Hallucinations:No data recorded Ideas of Reference:None  Suicidal Thoughts:No data recorded Homicidal Thoughts:No data recorded  Sensorium  Memory: Immediate Fair; Recent Good  Judgment: Intact  Insight: Fair; Present   Executive Functions  Concentration: Good  Attention Span: Good  Recall: Good  Fund of Knowledge: Good  Language: Good   Psychomotor Activity  Psychomotor Activity:No data recorded  Assets  Assets: Communication Skills; Desire for Improvement; Financial Resources/Insurance; Leisure Time; Physical Health   Sleep  Sleep:No data recorded    Blood pressure (!) 148/110, pulse 71, temperature (!) 96.6 F (35.9 C), resp. rate 16, height 6' (1.829 m), weight 117 kg, SpO2 96%. Body mass index is 34.99 kg/m.   Treatment Plan Summary: Daily contact with patient to assess and evaluate symptoms and progress in treatment, Medication management, and Plan continue current medications.  Sarina Ill, DO 02/07/2023,  11:10 AM

## 2023-02-07 NOTE — Plan of Care (Signed)
D: Pt alert and oriented. Pt reports experiencing anxiety/depression at this time. Pt reports experiencing 10/10 chronic back pain at this time, prn medication given. Pt denies experiencing any SI/HI, or VH at this time however, reports experiencing AH. Pt reports the voice tell him to hurt himself however, he as no intent to act upon what the voices are telling him to do.   A: Scheduled medications administered to pt, per MD orders. Support and encouragement provided. Frequent verbal contact made. Routine safety checks conducted q15 minutes.   R: No adverse drug reactions noted. Pt verbally contracts for safety at this time. Pt compliant with medications and treatment plan. Pt interacts well with others on the unit. Pt remains safe at this time. Plan of care ongoing.  Problem: Education: Goal: Emotional status will improve Outcome: Not Progressing Goal: Mental status will improve Outcome: Progressing

## 2023-02-07 NOTE — Plan of Care (Signed)
Alert and oriented. Calm and cooperative. Visible in the dayroom watching tv and interacting with peers. C/o back pain. Ultram 50 mg po given as ordered PRN for discomfort. Tol well. Denies SI/HI/AVH. Q 15 min checks maintained for safety.  Pt remains safe at this time.   Problem: Activity: Goal: Interest or engagement in activities will improve Outcome: Progressing Goal: Sleeping patterns will improve Outcome: Progressing   Problem: Coping: Goal: Ability to verbalize frustrations and anger appropriately will improve Outcome: Progressing Goal: Ability to demonstrate self-control will improve Outcome: Progressing   Problem: Safety: Goal: Periods of time without injury will increase Outcome: Progressing

## 2023-02-07 NOTE — Progress Notes (Signed)
   02/07/23 0615  15 Minute Checks  Location Bedroom  Visual Appearance Calm  Behavior Sleeping  Sleep (Behavioral Health Patients Only)  Calculate sleep? (Click Yes once per 24 hr at 0600 safety check) Yes  Documented sleep last 24 hours 7.5

## 2023-02-07 NOTE — Group Note (Signed)
Recreation Therapy Group Note   Group Topic:General Recreation  Group Date: 02/07/2023 Start Time: 1400 End Time: 1500 Facilitators: Rosina Lowenstein, LRT, CTRS Location:  Dayroom  Group Description: Bingo. LRT and patients played multiple games of Bingo with music playing in the background. LRT and pts discussed how this could be a leisure interest and the importance of doing things they enjoy post-discharge. Pts won stress balls and [employer] as Chief Financial Officer.   Goal Area(s) Addressed: Patient will identify leisure interests.  Patient will practice healthy decision making. Patient will engage in recreation activity.  Patient will increase communication.    Affect/Mood: N/A   Participation Level: Did not attend    Clinical Observations/Individualized Feedback: Travis [name] did not attend group.   Plan: Continue to engage patient in RT group sessions 2-3x/week.   Rosina Lowenstein, LRT, CTRS 02/07/2023 5:20 PM

## 2023-02-08 DIAGNOSIS — F332 Major depressive disorder, recurrent severe without psychotic features: Secondary | ICD-10-CM | POA: Diagnosis not present

## 2023-02-08 MED ORDER — RISPERIDONE 1 MG PO TABS
0.5000 mg | ORAL_TABLET | ORAL | Status: DC
  Administered 2023-02-08 – 2023-02-11 (×8): 0.5 mg via ORAL
  Filled 2023-02-08 (×8): qty 1

## 2023-02-08 NOTE — Plan of Care (Signed)
Pt is alert and oriented. Endorses anxiety and depression. C/o back pain. PRNs given for anxiety and pain. Po medications given as scheduled. Tol well. Denies SI/HI/AVH. Interacting with peers and staff. Q 15 min checks maintained for safety. Pt remains safe on the unit.   Problem: Activity: Goal: Interest or engagement in activities will improve Outcome: Progressing Goal: Sleeping patterns will improve Outcome: Progressing   Problem: Coping: Goal: Ability to verbalize frustrations and anger appropriately will improve Outcome: Progressing   Problem: Safety: Goal: Periods of time without injury will increase Outcome: Progressing

## 2023-02-08 NOTE — Progress Notes (Signed)
 Patient pleasant with staff.  Anxious, sad affect.  Often at nurse's station requesting PRN medications.  Endorses anxiety and depression.  Endorses AVH.  Denies SI while in the hospital.  Denies HI.  Pain rated 10/10 in back.   Compliant with scheduled medications.  PRN medications given for anxiety and pain. 15 min checks in place for safety.  Patient is present in the milieu.  Observed to have eyes closed. Appropriate interaction with peers.   Patient will not discharge today.  Per patient, his sister will not allow him to return to her home.

## 2023-02-08 NOTE — Group Note (Signed)
Date:  02/08/2023 Time:  8:27 PM  Group Topic/Focus:  Self Care:   The focus of this group is to help patients understand the importance of self-care in order to improve or restore emotional, physical, spiritual, interpersonal, and financial health.    Participation Level:  Active  Participation Quality:  Appropriate  Affect:  Appropriate  Cognitive:  Appropriate  Insight: Appropriate  Engagement in Group:  Engaged  Modes of Intervention:  Discussion  Additional Comments:    Burt Ek 02/08/2023, 8:27 PM

## 2023-02-08 NOTE — Plan of Care (Signed)
  Problem: Education: Goal: Utilization of techniques to improve thought processes will improve Outcome: Progressing Goal: Knowledge of the prescribed therapeutic regimen will improve Outcome: Progressing   Problem: Activity: Goal: Interest or engagement in leisure activities will improve Outcome: Progressing Goal: Imbalance in normal sleep/wake cycle will improve Outcome: Progressing   Problem: Coping: Goal: Coping ability will improve Outcome: Progressing

## 2023-02-08 NOTE — Progress Notes (Signed)
   02/08/23 0630  15 Minute Checks  Location Bedroom  Visual Appearance Calm  Behavior Sleeping  Sleep (Behavioral Health Patients Only)  Calculate sleep? (Click Yes once per 24 hr at 0600 safety check) Yes  Documented sleep last 24 hours 10

## 2023-02-08 NOTE — Progress Notes (Signed)
[facility] MD Progress Note  02/08/2023 10:13 AM Travis [name]  MRN:  [id number] Subjective: Travis [name] is seen on rounds.  He says that he is hearing voices and is suicidal and his sister will pick him up.  This is a change in his presentation.  He is able to contract for safety in the hospital but not outside the hospital.  He has no place to go right now.  We did start him on some low-dose Risperdal. Principal Problem: Major depressive disorder, recurrent episode, severe (HCC) Diagnosis: Principal Problem:   Major depressive disorder, recurrent episode, severe (HCC) Active Problems:   Methamphetamine abuse (HCC)   Mild tetrahydrocannabinol (THC) abuse  Total Time spent with patient: 15 minutes  Past Psychiatric History: Polysubstance abuse and depression  Past Medical History:  Past Medical History:  Diagnosis Date   Anxiety    Panic attacks   Chest pain    Hospital, March, 2014, negative enzymes, patient refused in-hospital  stress test, patient canceled outpatient stress test   Constipation    Degenerative disk disease    Degenerative disk disease    Depression    GERD (gastroesophageal reflux disease)    Hypertension    Incontinence of urine    Neuromuscular disorder (HCC)    Obesity    Polysubstance abuse (HCC)    PTSD (post-traumatic stress disorder)    Pulmonary emboli (HCC)    Seizures (HCC)    Shortness of breath dyspnea    with exertion   Spinal stenosis    Spinal stenosis    Suicide attempt (HCC)    Tachycardia - pulse    Tobacco abuse     Past Surgical History:  Procedure Laterality Date   COLONOSCOPY N/A 08/24/2014   Procedure: COLONOSCOPY;  Surgeon: Charlott Rakes, MD;  Location: [facility] ENDOSCOPY;  Service: Endoscopy;  Laterality: N/A;   ESOPHAGOGASTRODUODENOSCOPY (EGD) WITH PROPOFOL N/A 08/24/2014   Procedure: ESOPHAGOGASTRODUODENOSCOPY (EGD) WITH PROPOFOL;  Surgeon: Charlott Rakes, MD;  Location: [facility]COPY;  Service: Endoscopy;  Laterality: N/A;   WISDOM TOOTH  EXTRACTION     Family History:  Family History  Problem Relation Age of Onset   Stroke Mother    Hypertension Mother    Hyperlipidemia Mother    Stroke Father    Hypertension Father    Dementia Father    Diabetes Father    Heart disease Father    Hyperlipidemia Father    Cancer Sister        brain   Diabetes Sister    Heart disease Sister    Hyperlipidemia Sister    Hypertension Sister    Stroke Sister    Heart disease Brother    Hyperlipidemia Brother    Family Psychiatric  History: Unremarkable Social History:  Social History   Substance and Sexual Activity  Alcohol Use No     Social History   Substance and Sexual Activity  Drug Use Not Currently   Types: Amphetamines, Marijuana, Benzodiazepines    Social History   Socioeconomic History   Marital status: Divorced    Spouse name: Not on file   Number of children: Not on file   Years of education: Not on file   Highest education level: Not on file  Occupational History   Not on file  Tobacco Use   Smoking status: Every Day    Current packs/day: 0.00    Types: Cigarettes    Start date: 12/12/2016    Last attempt to quit: 12/12/2016    Years since quitting:  6.1   Smokeless tobacco: Never  Vaping Use   Vaping status: Every Day   Substances: Nicotine  Substance and Sexual Activity   Alcohol use: No   Drug use: Not Currently    Types: Amphetamines, Marijuana, Benzodiazepines   Sexual activity: Never  Other Topics Concern   Not on file  Social History Narrative   Not on file   Social Determinants of Health   Financial Resource Strain: High Risk (01/09/2023)   Received from [facility]   Overall Financial Resource Strain (CARDIA)    Difficulty of Paying Living Expenses: Hard  Food Insecurity: Food Insecurity Present (01/23/2023)   Hunger Vital Sign    Worried About Running Out of Food in the Last Year: Sometimes true    Ran Out of Food in the Last Year: Sometimes true  Transportation Needs:  Unmet Transportation Needs (01/23/2023)   PRAPARE - Administrator, Civil Service (Medical): Yes    Lack of Transportation (Non-Medical): Yes  Physical Activity: Inactive (01/09/2023)   Received from [facility]   Exercise Vital Sign    Days of Exercise per Week: 0 days    Minutes of Exercise per Session: 10 min  Stress: Stress Concern Present (01/09/2023)   Received from [facility] Occupational [facility] - Occupational Stress Questionnaire    Feeling of Stress : Rather much  Social Connections: Moderately Integrated (01/09/2023)   Received from [facility]   Social Network    How would you rate your social network (family, work, friends)?: Adequate participation with social networks   Additional Social History:                         Sleep: Good  Appetite:  Good  Current Medications: Current Facility-Administered Medications  Medication Dose Route Frequency Provider Last Rate Last Admin   acetaminophen (TYLENOL) tablet 650 mg  650 mg Oral Q6H PRN Maryagnes Amos, FNP   650 mg at 02/07/23 1736   alum & mag hydroxide-simeth (MAALOX/MYLANTA) 200-200-20 MG/5ML suspension 30 mL  30 mL Oral Q4H PRN Maryagnes Amos, FNP       amitriptyline (ELAVIL) tablet 25 mg  25 mg Oral QHS Sarina Ill, DO   25 mg at 02/07/23 2116   cloNIDine (CATAPRES) tablet 0.1 mg  0.1 mg Oral BID Sarina Ill, DO   0.1 mg at 02/08/23 0908   cyclobenzaprine (FLEXERIL) tablet 10 mg  10 mg Oral TID PRN Sarina Ill, DO   10 mg at 02/07/23 1736   escitalopram (LEXAPRO) tablet 20 mg  20 mg Oral QPC breakfast Sarina Ill, DO   20 mg at 02/08/23 0908   famotidine (PEPCID) tablet 20 mg  20 mg Oral Daily PRN Maryagnes Amos, FNP   20 mg at 01/24/23 0804   gabapentin (NEURONTIN) capsule 900 mg  900 mg Oral TID Lewanda Rife, MD   900 mg at 02/08/23 0908   loperamide (IMODIUM) capsule 4 mg  4 mg Oral PRN  Sarina Ill, DO   4 mg at 01/26/23 0810   LORazepam (ATIVAN) tablet 0.5 mg  0.5 mg Oral Q6H PRN Lewanda Rife, MD   0.5 mg at 02/08/23 0907   losartan (COZAAR) tablet 100 mg  100 mg Oral QPC breakfast Sarina Ill, DO   100 mg at 02/08/23 0907   magnesium hydroxide (MILK OF MAGNESIA) suspension 30 mL  30 mL Oral Daily PRN  Maryagnes Amos, FNP       metFORMIN (GLUCOPHAGE-XR) 24 hr tablet 500 mg  500 mg Oral Q breakfast Maryagnes Amos, FNP   500 mg at 02/08/23 0908   metoprolol succinate (TOPROL-XL) 24 hr tablet 50 mg  50 mg Oral Daily Sarina Ill, DO   50 mg at 02/08/23 0908   nicotine (NICODERM CQ - dosed in mg/24 hours) patch 14 mg  14 mg Transdermal Daily Sarina Ill, DO   14 mg at 02/08/23 0910   OLANZapine (ZYPREXA) tablet 10 mg  10 mg Oral Q6H PRN Sarina Ill, DO       pantoprazole (PROTONIX) EC tablet 40 mg  40 mg Oral Daily Maryagnes Amos, FNP   40 mg at 02/08/23 1610   QUEtiapine (SEROQUEL) tablet 300 mg  300 mg Oral QHS Sarina Ill, DO   300 mg at 02/07/23 2117   traMADol (ULTRAM) tablet 50 mg  50 mg Oral Q12H PRN Lewanda Rife, MD   50 mg at 02/07/23 2239    Lab Results: No results found for this or any previous visit (from the past 48 hour(s)).  Blood Alcohol level:  Lab Results  Component Value Date   ETH <10 01/20/2023   ETH <10 02/28/2018    Metabolic Disorder Labs: Lab Results  Component Value Date   HGBA1C 5.8 (H) 01/31/2023   MPG 119.76 01/31/2023   MPG 123 (H) 05/29/2012   No results found for: "PROLACTIN" Lab Results  Component Value Date   CHOL 140 02/03/2023   TRIG 174 (H) 02/03/2023   HDL 30 (L) 02/03/2023   CHOLHDL 4.7 02/03/2023   VLDL 35 02/03/2023   LDLCALC 75 02/03/2023   LDLCALC 73 05/30/2012    Physical Findings: AIMS:  , ,  ,  ,    CIWA:    COWS:     Musculoskeletal: Strength & Muscle Tone: within normal limits Gait & Station:  normal Patient leans: N/A  Psychiatric Specialty Exam:  Presentation  General Appearance:  Appropriate for Environment  Eye Contact: Good  Speech: Clear and Coherent; Normal Rate  Speech Volume: Normal  Handedness: Right   Mood and Affect  Mood: Anxious; Dysphoric  Affect: Congruent   Thought Process  Thought Processes: Coherent; Goal Directed  Descriptions of Associations:Intact  Orientation:Full (Time, Place and Person)  Thought Content:Logical; WDL  History of Schizophrenia/Schizoaffective disorder:No  Duration of Psychotic Symptoms:Greater than six months  Hallucinations:No data recorded Ideas of Reference:None  Suicidal Thoughts:No data recorded Homicidal Thoughts:No data recorded  Sensorium  Memory: Immediate Fair; Recent Good  Judgment: Intact  Insight: Fair; Present   Executive Functions  Concentration: Good  Attention Span: Good  Recall: Good  Fund of Knowledge: Good  Language: Good   Psychomotor Activity  Psychomotor Activity:No data recorded  Assets  Assets: Communication Skills; Desire for Improvement; Financial Resources/Insurance; Leisure Time; Physical Health   Sleep  Sleep:No data recorded    Blood pressure (!) 147/104, pulse 65, temperature (!) 97.5 F (36.4 C), resp. rate 18, height 6' (1.829 m), weight 117 kg, SpO2 97%. Body mass index is 34.99 kg/m.   Treatment Plan Summary: Daily contact with patient to assess and evaluate symptoms and progress in treatment, Medication management, and Plan start Risperdal 0.5 mg twice a day.  Sarina Ill, DO 02/08/2023, 10:13 AM

## 2023-02-08 NOTE — Plan of Care (Signed)
  Problem: Education: Goal: Knowledge of [facility] Education information/materials will improve Outcome: Progressing   Problem: Activity: Goal: Interest or engagement in activities will improve Outcome: Progressing   Problem: Health Behavior/Discharge Planning: Goal: Compliance with treatment plan for underlying cause of condition will improve Outcome: Progressing

## 2023-02-09 DIAGNOSIS — F332 Major depressive disorder, recurrent severe without psychotic features: Secondary | ICD-10-CM | POA: Diagnosis not present

## 2023-02-09 MED ORDER — DILTIAZEM HCL ER COATED BEADS 240 MG PO CP24
240.0000 mg | ORAL_CAPSULE | Freq: Every day | ORAL | Status: DC
  Administered 2023-02-10 – 2023-02-11 (×2): 240 mg via ORAL
  Filled 2023-02-09 (×2): qty 1

## 2023-02-09 NOTE — BH IP Treatment Plan (Signed)
Interdisciplinary Treatment and Diagnostic Plan Update  02/09/2023 Time of Session: 9:30 AM  Travis [name] MRN: [id number]  Principal Diagnosis: Major depressive disorder, recurrent episode, severe (HCC)  Secondary Diagnoses: Principal Problem:   Major depressive disorder, recurrent episode, severe (HCC) Active Problems:   Methamphetamine abuse (HCC)   Mild tetrahydrocannabinol (THC) abuse   Current Medications:  Current Facility-Administered Medications  Medication Dose Route Frequency Provider Last Rate Last Admin   acetaminophen (TYLENOL) tablet 650 mg  650 mg Oral Q6H PRN Maryagnes Amos, FNP   650 mg at 02/07/23 1736   alum & mag hydroxide-simeth (MAALOX/MYLANTA) 200-200-20 MG/5ML suspension 30 mL  30 mL Oral Q4H PRN Maryagnes Amos, FNP       amitriptyline (ELAVIL) tablet 25 mg  25 mg Oral QHS Sarina Ill, DO   25 mg at 02/08/23 2111   cloNIDine (CATAPRES) tablet 0.1 mg  0.1 mg Oral BID Sarina Ill, DO   0.1 mg at 02/09/23 0944   cyclobenzaprine (FLEXERIL) tablet 10 mg  10 mg Oral TID PRN Sarina Ill, DO   10 mg at 02/09/23 1234   [START ON 02/10/2023] diltiazem (CARDIZEM CD) 24 hr capsule 240 mg  240 mg Oral Daily Sarina Ill, DO       escitalopram (LEXAPRO) tablet 20 mg  20 mg Oral QPC breakfast Sarina Ill, DO   20 mg at 02/09/23 0943   famotidine (PEPCID) tablet 20 mg  20 mg Oral Daily PRN Maryagnes Amos, FNP   20 mg at 01/24/23 0804   gabapentin (NEURONTIN) capsule 900 mg  900 mg Oral TID Lewanda Rife, MD   900 mg at 02/09/23 0943   loperamide (IMODIUM) capsule 4 mg  4 mg Oral PRN Sarina Ill, DO   4 mg at 01/26/23 0810   LORazepam (ATIVAN) tablet 0.5 mg  0.5 mg Oral Q6H PRN Lewanda Rife, MD   0.5 mg at 02/09/23 0955   magnesium hydroxide (MILK OF MAGNESIA) suspension 30 mL  30 mL Oral Daily PRN Maryagnes Amos, FNP       metFORMIN (GLUCOPHAGE-XR) 24 hr tablet 500 mg   500 mg Oral Q breakfast Maryagnes Amos, FNP   500 mg at 02/09/23 0943   nicotine (NICODERM CQ - dosed in mg/24 hours) patch 14 mg  14 mg Transdermal Daily Sarina Ill, DO   14 mg at 02/09/23 0946   OLANZapine (ZYPREXA) tablet 10 mg  10 mg Oral Q6H PRN Sarina Ill, DO       pantoprazole (PROTONIX) EC tablet 40 mg  40 mg Oral Daily Maryagnes Amos, FNP   40 mg at 02/09/23 0943   QUEtiapine (SEROQUEL) tablet 300 mg  300 mg Oral QHS Sarina Ill, DO   300 mg at 02/08/23 2110   risperiDONE (RISPERDAL) tablet 0.5 mg  0.5 mg Oral BH-q8a4p Sarina Ill, DO   0.5 mg at 02/09/23 7169   traMADol (ULTRAM) tablet 50 mg  50 mg Oral Q12H PRN Lewanda Rife, MD   50 mg at 02/09/23 1048   PTA Medications: Medications Prior to Admission  Medication Sig Dispense Refill Last Dose   acetaminophen (TYLENOL) 500 MG tablet Take 1,000 mg by mouth every 6 (six) hours as needed for mild pain or moderate pain.      CLARITIN 10 MG tablet Take 10 mg by mouth daily as needed for allergies.      cyclobenzaprine (FLEXERIL) 10 MG tablet Take 10 mg  by mouth 2 (two) times daily.      famotidine (PEPCID) 20 MG tablet Take 20 mg by mouth daily as needed for heartburn or indigestion. (Patient not taking: Reported on 01/20/2023)      gabapentin (NEURONTIN) 600 MG tablet Take 1,200 mg by mouth 3 (three) times daily.      lisinopril (ZESTRIL) 20 MG tablet Take 20 mg by mouth daily.      metFORMIN (GLUCOPHAGE-XR) 500 MG 24 hr tablet Take 500 mg by mouth daily with breakfast. (Patient not taking: Reported on 01/20/2023)      metoprolol tartrate (LOPRESSOR) 100 MG tablet Take 100 mg by mouth 2 (two) times daily.      pantoprazole (PROTONIX) 40 MG tablet Take 1 tablet (40 mg total) by mouth daily. (Patient not taking: Reported on 01/20/2023) 30 tablet 0    QUEtiapine (SEROQUEL) 200 MG tablet Take 300 mg by mouth at bedtime.      sertraline (ZOLOFT) 25 MG tablet Take 1 tablet  (25 mg total) by mouth daily. 14 tablet 0     Patient Stressors: Financial difficulties   Marital or family conflict   Substance abuse    Patient Strengths: Active sense of humor   Treatment Modalities: Medication Management, Group therapy, Case management,  1 to 1 session with clinician, Psychoeducation, Recreational therapy.   Physician Treatment Plan for Primary Diagnosis: Major depressive disorder, recurrent episode, severe (HCC) Long Term Goal(s): Improvement in symptoms so as ready for discharge   Short Term Goals: Ability to identify changes in lifestyle to reduce recurrence of condition will improve Ability to verbalize feelings will improve Ability to disclose and discuss suicidal ideas Ability to demonstrate self-control will improve Ability to identify and develop effective coping behaviors will improve Ability to maintain clinical measurements within normal limits will improve Compliance with prescribed medications will improve Ability to identify triggers associated with substance abuse/mental health issues will improve  Medication Management: Evaluate patient's response, side effects, and tolerance of medication regimen.  Therapeutic Interventions: 1 to 1 sessions, Unit Group sessions and Medication administration.  Evaluation of Outcomes: Adequate for Discharge  Physician Treatment Plan for Secondary Diagnosis: Principal Problem:   Major depressive disorder, recurrent episode, severe (HCC) Active Problems:   Methamphetamine abuse (HCC)   Mild tetrahydrocannabinol (THC) abuse  Long Term Goal(s): Improvement in symptoms so as ready for discharge   Short Term Goals: Ability to identify changes in lifestyle to reduce recurrence of condition will improve Ability to verbalize feelings will improve Ability to disclose and discuss suicidal ideas Ability to demonstrate self-control will improve Ability to identify and develop effective coping behaviors will  improve Ability to maintain clinical measurements within normal limits will improve Compliance with prescribed medications will improve Ability to identify triggers associated with substance abuse/mental health issues will improve     Medication Management: Evaluate patient's response, side effects, and tolerance of medication regimen.  Therapeutic Interventions: 1 to 1 sessions, Unit Group sessions and Medication administration.  Evaluation of Outcomes: Adequate for Discharge   RN Treatment Plan for Primary Diagnosis: Major depressive disorder, recurrent episode, severe (HCC) Long Term Goal(s): Knowledge of disease and therapeutic regimen to maintain health will improve  Short Term Goals: Ability to remain free from injury will improve, Ability to verbalize frustration and anger appropriately will improve, Ability to demonstrate self-control, Ability to participate in decision making will improve, Ability to verbalize feelings will improve, Ability to disclose and discuss suicidal ideas, Ability to identify and develop effective coping behaviors  will improve, and Compliance with prescribed medications will improve  Medication Management: RN will administer medications as ordered by provider, will assess and evaluate patient's response and provide education to patient for prescribed medication. RN will report any adverse and/or side effects to prescribing provider.  Therapeutic Interventions: 1 on 1 counseling sessions, Psychoeducation, Medication administration, Evaluate responses to treatment, Monitor vital signs and CBGs as ordered, Perform/monitor CIWA, COWS, AIMS and Fall Risk screenings as ordered, Perform wound care treatments as ordered.  Evaluation of Outcomes: Adequate for Discharge   LCSW Treatment Plan for Primary Diagnosis: Major depressive disorder, recurrent episode, severe (HCC) Long Term Goal(s): Safe transition to appropriate next level of care at discharge, Engage patient  in therapeutic group addressing interpersonal concerns.  Short Term Goals: Engage patient in aftercare planning with referrals and resources, Increase social support, Increase ability to appropriately verbalize feelings, Increase emotional regulation, Facilitate acceptance of mental health diagnosis and concerns, Facilitate patient progression through stages of change regarding substance use diagnoses and concerns, Identify triggers associated with mental health/substance abuse issues, and Increase skills for wellness and recovery  Therapeutic Interventions: Assess for all discharge needs, 1 to 1 time with Social worker, Explore available resources and support systems, Assess for adequacy in community support network, Educate family and significant other(s) on suicide prevention, Complete Psychosocial Assessment, Interpersonal group therapy.  Evaluation of Outcomes: Adequate for Discharge   Progress in Treatment: Attending groups: Yes. and No. Participating in groups: Yes. and No. Taking medication as prescribed: Yes. Toleration medication: Yes. Family/Significant other contact made: No, will contact:  CSW will contact if given permission  Patient understands diagnosis: Yes. Discussing patient identified problems/goals with staff: Yes. Medical problems stabilized or resolved: Yes. Denies suicidal/homicidal ideation: Yes. Issues/concerns per patient self-inventory: No. Other: None   New problem(s) identified: No, Describe:  None identified  Update 01/29/23: No changes at this time Update 02/03/23: No changes at this time Update 02/09/23: No changes at this tome    New Short Term/Long Term Goal(s): detox, elimination of symptoms of psychosis, medication management for mood stabilization; elimination of SI thoughts; development of comprehensive mental wellness/sobriety plan. Update 01/29/23: No changes at this time Update 02/03/23: No changes at this time Update 02/09/23: No changes at this tome       Patient Goals:  "I want to feel like I'm not going to run out and go into traffic" Update 01/29/23: No changes at this time Update 02/03/23: No changes at this time Update 02/09/23: No changes at this tome      Discharge Plan or Barriers: CSW will assist with appropriate discharge planning Update 01/29/23: No changes at this time Update 02/03/23: No changes at this time Update 02/09/23: Pt will discharge to a shelter to due rehab not being an option for him because of insurance     Reason for Continuation of Hospitalization: Depression Medication stabilization Suicidal ideation   Estimated Length of Stay: 1 to 7 days Update 01/29/23: TBD Update 02/03/23: TBD Update 02/09/23: TBD  Last 3 [place] Suicide Severity Risk Score: Flowsheet Row Admission (Current) from 01/23/2023 in [facility] BE[facility]IORAL [facility] Most recent reading at 01/23/2023  3:00 AM ED from 01/21/2023 in [facility] Emergency Department at [facility] Most recent reading at 01/21/2023 10:49 PM ED from 01/21/2023 in [facility] Emergency Department at [facility] Most recent reading at 01/21/2023  4:52 PM  C-SSRS RISK CATEGORY High Risk High Risk No Risk       Last PHQ 2/9 Scores:  01/22/2023   12:34 AM 08/26/2014    4:11 PM 04/07/2014   11:30 AM  Depression screen PHQ 2/9  Decreased Interest 2 2 1   Down, Depressed, Hopeless 2 2 3   PHQ - 2 Score 4 4 4   Altered sleeping 3 3 3   Tired, decreased energy 3 3 2   Change in appetite 2 2 2   Feeling bad or failure about yourself  3 3 0  Trouble concentrating 3 3 1   Moving slowly or fidgety/restless 0 0 0  Suicidal thoughts 1 1 0  PHQ-9 Score 19 19 12   Difficult doing work/chores Very difficult      Scribe for Treatment Team: Elza Rafter, LCSWA 02/09/2023 3:00 PM

## 2023-02-09 NOTE — Progress Notes (Signed)
[facility] MD Progress Note  02/09/2023 11:42 AM Travis [name]  MRN:  [id number] Subjective: Travis [name] is seen on rounds.  He no longer has a discharge disposition.  He states his sister is not going to pick him up.  I was going to titrate his blood pressure medications but he was going to be discharged.  Since he is staying we will go ahead and make some changes to his blood pressure medicines.  He has a long family history of cardiovascular disease.  He is pleasant and cooperative on the unit and able to contract for safety in the hospital.  I do not think he would do very well being discharged to the streets.  He has been compliant with medications.  He does participate in groups.  Suggest that social work continue to look for drug and alcohol rehab. Principal Problem: Major depressive disorder, recurrent episode, severe (HCC) Diagnosis: Principal Problem:   Major depressive disorder, recurrent episode, severe (HCC) Active Problems:   Methamphetamine abuse (HCC)   Mild tetrahydrocannabinol (THC) abuse  Total Time spent with patient: 15 minutes  Past Psychiatric History: Polysubstance abuse and depression.  Past Medical History:  Past Medical History:  Diagnosis Date   Anxiety    Panic attacks   Chest pain    Hospital, March, 2014, negative enzymes, patient refused in-hospital  stress test, patient canceled outpatient stress test   Constipation    Degenerative disk disease    Degenerative disk disease    Depression    GERD (gastroesophageal reflux disease)    Hypertension    Incontinence of urine    Neuromuscular disorder (HCC)    Obesity    Polysubstance abuse (HCC)    PTSD (post-traumatic stress disorder)    Pulmonary emboli (HCC)    Seizures (HCC)    Shortness of breath dyspnea    with exertion   Spinal stenosis    Spinal stenosis    Suicide attempt (HCC)    Tachycardia - pulse    Tobacco abuse     Past Surgical History:  Procedure Laterality Date   COLONOSCOPY N/A 08/24/2014    Procedure: COLONOSCOPY;  Surgeon: Charlott Rakes, MD;  Location: [facility] ENDOSCOPY;  Service: Endoscopy;  Laterality: N/A;   ESOPHAGOGASTRODUODENOSCOPY (EGD) WITH PROPOFOL N/A 08/24/2014   Procedure: ESOPHAGOGASTRODUODENOSCOPY (EGD) WITH PROPOFOL;  Surgeon: Charlott Rakes, MD;  Location: [facility] ENDOSCOPY;  Service: Endoscopy;  Laterality: N/A;   WISDOM TOOTH EXTRACTION     Family History:  Family History  Problem Relation Age of Onset   Stroke Mother    Hypertension Mother    Hyperlipidemia Mother    Stroke Father    Hypertension Father    Dementia Father    Diabetes Father    Heart disease Father    Hyperlipidemia Father    Cancer Sister        brain   Diabetes Sister    Heart disease Sister    Hyperlipidemia Sister    Hypertension Sister    Stroke Sister    Heart disease Brother    Hyperlipidemia Brother    Family Psychiatric  History: Unremarkable Social History:  Social History   Substance and Sexual Activity  Alcohol Use No     Social History   Substance and Sexual Activity  Drug Use Not Currently   Types: Amphetamines, Marijuana, Benzodiazepines    Social History   Socioeconomic History   Marital status: Divorced    Spouse name: Not on file   Number of children: Not on file  Years of education: Not on file   Highest education level: Not on file  Occupational History   Not on file  Tobacco Use   Smoking status: Every Day    Current packs/day: 0.00    Types: Cigarettes    Start date: 12/12/2016    Last attempt to quit: 12/12/2016    Years since quitting: 6.1   Smokeless tobacco: Never  Vaping Use   Vaping status: Every Day   Substances: Nicotine  Substance and Sexual Activity   Alcohol use: No   Drug use: Not Currently    Types: Amphetamines, Marijuana, Benzodiazepines   Sexual activity: Never  Other Topics Concern   Not on file  Social History Narrative   Not on file   Social Determinants of Health   Financial Resource Strain: High Risk  (01/09/2023)   Received from [facility]   Overall Financial Resource Strain (CARDIA)    Difficulty of Paying Living Expenses: Hard  Food Insecurity: Food Insecurity Present (01/23/2023)   Hunger Vital Sign    Worried About Running Out of Food in the Last Year: Sometimes true    Ran Out of Food in the Last Year: Sometimes true  Transportation Needs: Unmet Transportation Needs (01/23/2023)   PRAPARE - Administrator, Civil Service (Medical): Yes    Lack of Transportation (Non-Medical): Yes  Physical Activity: Inactive (01/09/2023)   Received from [facility]   Exercise Vital Sign    Days of Exercise per Week: 0 days    Minutes of Exercise per Session: 10 min  Stress: Stress Concern Present (01/09/2023)   Received from [facility] - Occupational Stress Questionnaire    Feeling of Stress : Rather much  Social Connections: Moderately Integrated (01/09/2023)   Received from [facility]   Social Network    How would you rate your social network (family, work, friends)?: Adequate participation with social networks   Additional Social History:                         Sleep: Good  Appetite:  Good  Current Medications: Current Facility-Administered Medications  Medication Dose Route Frequency Provider Last Rate Last Admin   acetaminophen (TYLENOL) tablet 650 mg  650 mg Oral Q6H PRN Maryagnes Amos, FNP   650 mg at 02/07/23 1736   alum & mag hydroxide-simeth (MAALOX/MYLANTA) 200-200-20 MG/5ML suspension 30 mL  30 mL Oral Q4H PRN Maryagnes Amos, FNP       amitriptyline (ELAVIL) tablet 25 mg  25 mg Oral QHS Sarina Ill, DO   25 mg at 02/08/23 2111   cloNIDine (CATAPRES) tablet 0.1 mg  0.1 mg Oral BID Sarina Ill, DO   0.1 mg at 02/09/23 0944   cyclobenzaprine (FLEXERIL) tablet 10 mg  10 mg Oral TID PRN Sarina Ill, DO   10 mg at 02/07/23 1736   [START ON 02/10/2023]  diltiazem (CARDIZEM CD) 24 hr capsule 240 mg  240 mg Oral Daily Sarina Ill, DO       escitalopram (LEXAPRO) tablet 20 mg  20 mg Oral QPC breakfast Sarina Ill, DO   20 mg at 02/09/23 0943   famotidine (PEPCID) tablet 20 mg  20 mg Oral Daily PRN Maryagnes Amos, FNP   20 mg at 01/24/23 0804   gabapentin (NEURONTIN) capsule 900 mg  900 mg Oral TID Lewanda Rife, MD   900 mg at  02/09/23 0943   loperamide (IMODIUM) capsule 4 mg  4 mg Oral PRN Sarina Ill, DO   4 mg at 01/26/23 0810   LORazepam (ATIVAN) tablet 0.5 mg  0.5 mg Oral Q6H PRN Lewanda Rife, MD   0.5 mg at 02/09/23 0955   magnesium hydroxide (MILK OF MAGNESIA) suspension 30 mL  30 mL Oral Daily PRN Maryagnes Amos, FNP       metFORMIN (GLUCOPHAGE-XR) 24 hr tablet 500 mg  500 mg Oral Q breakfast Maryagnes Amos, FNP   500 mg at 02/09/23 0943   nicotine (NICODERM CQ - dosed in mg/24 hours) patch 14 mg  14 mg Transdermal Daily Sarina Ill, DO   14 mg at 02/09/23 0946   OLANZapine (ZYPREXA) tablet 10 mg  10 mg Oral Q6H PRN Sarina Ill, DO       pantoprazole (PROTONIX) EC tablet 40 mg  40 mg Oral Daily Maryagnes Amos, FNP   40 mg at 02/09/23 9147   QUEtiapine (SEROQUEL) tablet 300 mg  300 mg Oral QHS Sarina Ill, DO   300 mg at 02/08/23 2110   risperiDONE (RISPERDAL) tablet 0.5 mg  0.5 mg Oral BH-q8a4p Sarina Ill, DO   0.5 mg at 02/09/23 0943   traMADol (ULTRAM) tablet 50 mg  50 mg Oral Q12H PRN Lewanda Rife, MD   50 mg at 02/09/23 1048    Lab Results: No results found for this or any previous visit (from the past 48 hour(s)).  Blood Alcohol level:  Lab Results  Component Value Date   ETH <10 01/20/2023   ETH <10 02/28/2018    Metabolic Disorder Labs: Lab Results  Component Value Date   HGBA1C 5.8 (H) 01/31/2023   MPG 119.76 01/31/2023   MPG 123 (H) 05/29/2012   No results found for: "PROLACTIN" Lab Results   Component Value Date   CHOL 140 02/03/2023   TRIG 174 (H) 02/03/2023   HDL 30 (L) 02/03/2023   CHOLHDL 4.7 02/03/2023   VLDL 35 02/03/2023   LDLCALC 75 02/03/2023   LDLCALC 73 05/30/2012    Physical Findings: AIMS:  , ,  ,  ,    CIWA:    COWS:     Musculoskeletal: Strength & Muscle Tone: within normal limits Gait & Station: ataxic Patient leans: N/A  Psychiatric Specialty Exam:  Presentation  General Appearance:  Appropriate for Environment  Eye Contact: Good  Speech: Clear and Coherent; Normal Rate  Speech Volume: Normal  Handedness: Right   Mood and Affect  Mood: Anxious; Dysphoric  Affect: Congruent   Thought Process  Thought Processes: Coherent; Goal Directed  Descriptions of Associations:Intact  Orientation:Full (Time, Place and Person)  Thought Content:Logical; WDL  History of Schizophrenia/Schizoaffective disorder:No  Duration of Psychotic Symptoms:Greater than six months  Hallucinations:No data recorded Ideas of Reference:None  Suicidal Thoughts:No data recorded Homicidal Thoughts:No data recorded  Sensorium  Memory: Immediate Fair; Recent Good  Judgment: Intact  Insight: Fair; Present   Executive Functions  Concentration: Good  Attention Span: Good  Recall: Good  Fund of Knowledge: Good  Language: Good   Psychomotor Activity  Psychomotor Activity:No data recorded  Assets  Assets: Communication Skills; Desire for Improvement; Financial Resources/Insurance; Leisure Time; Physical Health  Sleep:No data recorded  MENTAL STATUS EXAM: Patient is alert and oriented x 3, pleasant and cooperative, good eye contact, speech is normal and not pressured, mood is depressed; affect is flat; thought process: goal directed; thought content: Currently denies suicidal  ideation; judgment is poor, insight is poor.  Blood pressure (!) 137/91, pulse 61, temperature (!) 96.8 F (36 C), resp. rate 16, height 6' (1.829 m),  weight 117 kg, SpO2 95%. Body mass index is 34.99 kg/m.   Treatment Plan Summary: Daily contact with patient to assess and evaluate symptoms and progress in treatment, Medication management, and Plan continue current medication.  Discontinue Cozaar and Toprol-XL and start Cardizem CD 240 mg/day starting tomorrow.  Sarina Ill, DO 02/09/2023, 11:42 AM

## 2023-02-09 NOTE — Plan of Care (Signed)
  Problem: Activity: Goal: Interest or engagement in activities will improve Outcome: Progressing   Problem: Coping: Goal: Ability to verbalize frustrations and anger appropriately will improve Outcome: Progressing   

## 2023-02-09 NOTE — Progress Notes (Signed)
 Patient pleasant and cooperative.  Anxious, sad affect.  Endorses anxiety and depression.  Poor sleep. Endorses AVH. Denies SI/HI. Pain rated 9/10 in back.    Compliant with scheduled medications.  PRN medications for anxiety and pain given as ordered.  15 min checks in place for safety.  Pt present in the milieu.  Appropriate interaction with peers.    Patient requesting another dose of anxiety medication.  RN explained PRN medication could not be given until 1600.  Pt accepting and returned to dayroom.

## 2023-02-09 NOTE — Progress Notes (Signed)
   02/09/23 0615  15 Minute Checks  Location Bedroom;[facility]  Visual Appearance Calm  Behavior Sleeping  Sleep (Behavioral Health Patients Only)  Calculate sleep? (Click Yes once per 24 hr at 0600 safety check) Yes  Documented sleep last 24 hours 8.75

## 2023-02-09 NOTE — Group Note (Unsigned)
Date:  02/10/2023 Time:  12:54 AM  Group Topic/Focus:  Wrap-Up Group:   The focus of this group is to help patients review their daily goal of treatment and discuss progress on daily workbooks.    Participation Level:  Active  Participation Quality:  Appropriate  Affect:  Appropriate  Cognitive:  Appropriate  Insight: Appropriate  Engagement in Group:  Engaged  Modes of Intervention:  Discussion  Additional Comments:    Maeola Harman 02/10/2023, 12:54 AM

## 2023-02-10 DIAGNOSIS — F1210 Cannabis abuse, uncomplicated: Secondary | ICD-10-CM | POA: Diagnosis not present

## 2023-02-10 DIAGNOSIS — F332 Major depressive disorder, recurrent severe without psychotic features: Secondary | ICD-10-CM | POA: Diagnosis not present

## 2023-02-10 NOTE — Plan of Care (Signed)
  Problem: Education: Goal: Knowledge of [place] [facility] Education information/materials will improve Outcome: Progressing   Problem: Activity: Goal: Interest or engagement in activities will improve Outcome: Progressing   Problem: Coping: Goal: Ability to verbalize frustrations and anger appropriately will improve Outcome: Progressing   Problem: Education: Goal: Utilization of techniques to improve thought processes will improve Outcome: Progressing

## 2023-02-10 NOTE — Group Note (Signed)
Recreation Therapy Group Note   Group Topic:Relaxation  Group Date: 02/10/2023 Start Time: 1400 End Time: 1445 Facilitators: Rosina Lowenstein, LRT, CTRS Location: Dayroom  Group Description: PMR (Progressive Muscle Relaxation). LRT educates patients on what PMR is and the benefits that come from it. Patients are asked to sit with their feet flat on the floor while sitting up and all the way back in their chair, if possible. LRT and pts follow a prompt through the TV speaker that requires you to tense and release different muscles in their body and focus on their breathing. During session, lights are off and soft music is being played. Pts are given a stress ball to use if needed. LRT and pts process how this can be used as a coping skill post-discharge.   Goal Area(s) Addressed:  Patients will be able to describe progressive muscle relaxation.  Patient will practice using relaxation technique. Patient will identify a new coping skill.  Patient will follow multistep directions to reduce anxiety and stress.     Affect/Mood: N/A   Participation Level: Did not attend    Clinical Observations/Individualized Feedback: Travis [name] did not attend group.  Plan: Continue to engage patient in RT group sessions 2-3x/week.   Rosina Lowenstein, LRT, CTRS 02/10/2023 4:36 PM

## 2023-02-10 NOTE — Progress Notes (Signed)
[facility] MD Progress Note  02/10/2023  Travis [name]  MRN:  [id number]  Travis [name] is a 57 year old white male who was voluntarily admitted to inpatient psychiatry on transfer from [facility]. He endorses anhedonia, difficulty sleeping, depressed mood and suicidal ideation.   Subjective: Chart reviewed, case discussed in meeting today, patient seen during rounds.  Per staff report patient is doing fine on the unit, he is awaiting placement in a rehab facility.  Patient reports that he is doing fine today.  Patient reports change in medicine has helped him with his symptoms.Today he denies thoughts of harming himself.  Patient was encouraged to attend group and work on coping strategies.    Principal Problem: Major depressive disorder, recurrent episode, severe (HCC) Diagnosis: Principal Problem:   Major depressive disorder, recurrent episode, severe (HCC) Active Problems:   Methamphetamine abuse (HCC)   Mild tetrahydrocannabinol (THC) abuse    Past Medical History:  Past Medical History:  Diagnosis Date   Anxiety    Panic attacks   Chest pain    Hospital, March, 2014, negative enzymes, patient refused in-hospital  stress test, patient canceled outpatient stress test   Constipation    Degenerative disk disease    Degenerative disk disease    Depression    GERD (gastroesophageal reflux disease)    Hypertension    Incontinence of urine    Neuromuscular disorder (HCC)    Obesity    Polysubstance abuse (HCC)    PTSD (post-traumatic stress disorder)    Pulmonary emboli (HCC)    Seizures (HCC)    Shortness of breath dyspnea    with exertion   Spinal stenosis    Spinal stenosis    Suicide attempt (HCC)    Tachycardia - pulse    Tobacco abuse     Past Surgical History:  Procedure Laterality Date   COLONOSCOPY N/A 08/24/2014   Procedure: COLONOSCOPY;  Surgeon: Charlott Rakes, MD;  Location: [facility] ENDOSCOPY;  Service: Endoscopy;  Laterality: N/A;   ESOPHAGOGASTRODUODENOSCOPY (EGD) WITH  PROPOFOL N/A 08/24/2014   Procedure: ESOPHAGOGASTRODUODENOSCOPY (EGD) WITH PROPOFOL;  Surgeon: Charlott Rakes, MD;  Location: [facility] ENDOSCOPY;  Service: Endoscopy;  Laterality: N/A;   WISDOM TOOTH EXTRACTION     Family History:  Family History  Problem Relation Age of Onset   Stroke Mother    Hypertension Mother    Hyperlipidemia Mother    Stroke Father    Hypertension Father    Dementia Father    Diabetes Father    Heart disease Father    Hyperlipidemia Father    Cancer Sister        brain   Diabetes Sister    Heart disease Sister    Hyperlipidemia Sister    Hypertension Sister    Stroke Sister    Heart disease Brother    Hyperlipidemia Brother     Social History:  Social History   Substance and Sexual Activity  Alcohol Use No     Social History   Substance and Sexual Activity  Drug Use Not Currently   Types: Amphetamines, Marijuana, Benzodiazepines    Social History   Socioeconomic History   Marital status: Divorced    Spouse name: Not on file   Number of children: Not on file   Years of education: Not on file   Highest education level: Not on file  Occupational History   Not on file  Tobacco Use   Smoking status: Every Day    Current packs/day: 0.00    Types: Cigarettes  Start date: 12/12/2016    Last attempt to quit: 12/12/2016    Years since quitting: 6.1   Smokeless tobacco: Never  Vaping Use   Vaping status: Every Day   Substances: Nicotine  Substance and Sexual Activity   Alcohol use: No   Drug use: Not Currently    Types: Amphetamines, Marijuana, Benzodiazepines   Sexual activity: Never  Other Topics Concern   Not on file  Social History Narrative   Not on file   Social Determinants of Health   Financial Resource Strain: High Risk (01/09/2023)   Received from [facility]   Overall Financial Resource Strain (CARDIA)    Difficulty of Paying Living Expenses: Hard  Food Insecurity: Food Insecurity Present (01/23/2023)   Hunger Vital  Sign    Worried About Running Out of Food in the Last Year: Sometimes true    Ran Out of Food in the Last Year: Sometimes true  Transportation Needs: Unmet Transportation Needs (01/23/2023)   PRAPARE - Administrator, Civil Service (Medical): Yes    Lack of Transportation (Non-Medical): Yes  Physical Activity: Inactive (01/09/2023)   Received from [facility]   Exercise Vital Sign    Days of Exercise per Week: 0 days    Minutes of Exercise per Session: 10 min  Stress: Stress Concern Present (01/09/2023)   Received from [facility] of Occupational [facility] - Occupational Stress Questionnaire    Feeling of Stress : Rather much  Social Connections: Moderately Integrated (01/09/2023)   Received from [facility]   Social Network    How would you rate your social network (family, work, friends)?: Adequate participation with social networks   Additional Social History:     Patient is homeless                    Sleep: Fair  Appetite:  Fair  Current Medications: Current Facility-Administered Medications  Medication Dose Route Frequency Provider Last Rate Last Admin   acetaminophen (TYLENOL) tablet 650 mg  650 mg Oral Q6H PRN Maryagnes Amos, FNP   650 mg at 02/09/23 1855   alum & mag hydroxide-simeth (MAALOX/MYLANTA) 200-200-20 MG/5ML suspension 30 mL  30 mL Oral Q4H PRN Maryagnes Amos, FNP       amitriptyline (ELAVIL) tablet 25 mg  25 mg Oral QHS Sarina Ill, DO   25 mg at 02/09/23 2128   cloNIDine (CATAPRES) tablet 0.1 mg  0.1 mg Oral BID Sarina Ill, DO   0.1 mg at 02/10/23 1610   cyclobenzaprine (FLEXERIL) tablet 10 mg  10 mg Oral TID PRN Sarina Ill, DO   10 mg at 02/10/23 1605   diltiazem (CARDIZEM CD) 24 hr capsule 240 mg  240 mg Oral Daily Sarina Ill, DO   240 mg at 02/10/23 0950   escitalopram (LEXAPRO) tablet 20 mg  20 mg Oral QPC breakfast Sarina Ill, DO   20  mg at 02/10/23 0947   famotidine (PEPCID) tablet 20 mg  20 mg Oral Daily PRN Maryagnes Amos, FNP   20 mg at 01/24/23 0804   gabapentin (NEURONTIN) capsule 900 mg  900 mg Oral TID Lewanda Rife, MD   900 mg at 02/10/23 1605   loperamide (IMODIUM) capsule 4 mg  4 mg Oral PRN Sarina Ill, DO   4 mg at 01/26/23 0810   LORazepam (ATIVAN) tablet 0.5 mg  0.5 mg Oral Q6H PRN Lewanda Rife, MD  0.5 mg at 02/09/23 2253   magnesium hydroxide (MILK OF MAGNESIA) suspension 30 mL  30 mL Oral Daily PRN Maryagnes Amos, FNP       metFORMIN (GLUCOPHAGE-XR) 24 hr tablet 500 mg  500 mg Oral Q breakfast Maryagnes Amos, FNP   500 mg at 02/10/23 0949   nicotine (NICODERM CQ - dosed in mg/24 hours) patch 14 mg  14 mg Transdermal Daily Sarina Ill, DO   14 mg at 02/10/23 0953   OLANZapine (ZYPREXA) tablet 10 mg  10 mg Oral Q6H PRN Sarina Ill, DO       pantoprazole (PROTONIX) EC tablet 40 mg  40 mg Oral Daily Maryagnes Amos, FNP   40 mg at 02/10/23 0950   QUEtiapine (SEROQUEL) tablet 300 mg  300 mg Oral QHS Sarina Ill, DO   300 mg at 02/09/23 2128   risperiDONE (RISPERDAL) tablet 0.5 mg  0.5 mg Oral BH-q8a4p Sarina Ill, DO   0.5 mg at 02/10/23 1605   traMADol (ULTRAM) tablet 50 mg  50 mg Oral Q12H PRN Lewanda Rife, MD   50 mg at 02/10/23 1610    Lab Results:  No results found for this or any previous visit (from the past 48 hour(s)).   Blood Alcohol level:  Lab Results  Component Value Date   ETH <10 01/20/2023   ETH <10 02/28/2018    Metabolic Disorder Labs: Lab Results  Component Value Date   HGBA1C 5.8 (H) 01/31/2023   MPG 119.76 01/31/2023   MPG 123 (H) 05/29/2012   No results found for: "PROLACTIN" Lab Results  Component Value Date   CHOL 140 02/03/2023   TRIG 174 (H) 02/03/2023   HDL 30 (L) 02/03/2023   CHOLHDL 4.7 02/03/2023   VLDL 35 02/03/2023   LDLCALC 75 02/03/2023   LDLCALC 73  05/30/2012    Musculoskeletal: Strength & Muscle Tone: within normal limits Gait & Station:  WNL Patient leans: N/A  Psychiatric Specialty Exam:  Presentation  General Appearance:  Appropriate for Environment  Eye Contact: Good  Speech: Clear and Coherent; Normal Rate  Speech Volume: Normal  Handedness: Right   Mood and Affect  Mood: "Better  Affect: Less Constricted   Thought Process  Thought Processes: Coherent; Goal Directed  Descriptions of Associations:Intact  Orientation:Full (Time, Place and Person)  Thought Content:Logical; WDL  History of Schizophrenia/Schizoaffective disorder:No  Duration of Psychotic Symptoms:Greater than six months  Hallucinations:Denies Ideas of Reference:None  Suicidal Thoughts:Denies Homicidal Thoughts:Denies  Sensorium  Memory: Immediate Fair; Recent Good  Judgment: Intact  Insight: Improving   Executive Functions  Concentration: Good  Attention Span: Good  Recall: Good  Fund of Knowledge: Good  Language: Good   Psychomotor Activity  Psychomotor Activity:Decreased  Assets  Assets: Communication Skills; Desire for Improvement; Financial Resources/Insurance; Leisure Time; Physical Health   Sleep  Sleep:Fair   Physical Exam: Physical Exam HENT:     Head: Normocephalic and atraumatic.     Nose: Nose normal.  Eyes:     Pupils: Pupils are equal, round, and reactive to light.  Pulmonary:     Effort: Pulmonary effort is normal.     Breath sounds: Normal breath sounds.  Skin:    General: Skin is warm.  Neurological:     General: No focal deficit present.     Mental Status: He is alert and oriented to person, place, and time.    Review of Systems  Constitutional:  Negative for fever.  HENT:  Negative for hearing loss and sore throat.   Respiratory:  Negative for cough and shortness of breath.   Cardiovascular:  Negative for chest pain and palpitations.  Gastrointestinal:   Negative for heartburn, nausea and vomiting.  Neurological:  Negative for dizziness and speech change.   Blood pressure (!) 139/97, pulse 66, temperature 98 F (36.7 C), resp. rate 15, height 6' (1.829 m), weight 117 kg, SpO2 96%. Body mass index is 34.99 kg/m.   Treatment Plan Summary: Daily contact with patient to assess and evaluate symptoms and progress in treatment and Medication management   Lewanda Rife, MD

## 2023-02-10 NOTE — Progress Notes (Signed)
   02/09/23 2300  Psych Admission Type (Psych Patients Only)  Admission Status Voluntary  Psychosocial Assessment  Patient Complaints Anxiety  Eye Contact Fair  Facial Expression Animated  Affect Appropriate to circumstance  Speech Logical/coherent  Interaction Assertive  Motor Activity Slow  Appearance/Hygiene In scrubs  Behavior Characteristics Cooperative;Appropriate to situation  Mood Pleasant  Thought Process  Coherency WDL  Content WDL  Delusions None reported or observed  Perception WDL  Hallucination None reported or observed  Judgment Impaired  Confusion None  Danger to Self  Current suicidal ideation? Denies  Agreement Not to Harm Self Yes  Description of Agreement verbal  Danger to Others  Danger to Others None reported or observed

## 2023-02-10 NOTE — Progress Notes (Signed)
Patient is a voluntary admission to [facility] for [facility] and polysubstance abuse.  Patient mostly stated in his room except for meals but did make a few phone calls to shelters today in anticipation of discharge. Patient requested PRN's of tramadol 1x and flexeril 2x.  Endorses anxiety at the thought of discharge. His insurance doesn't pay for rehab. Declined a shelter that required he work.  Patient states he is disabled d/t back pain but didn't qualify as disabled by the state. Will continue to monitor.

## 2023-02-10 NOTE — Group Note (Signed)
Date:  02/10/2023 Time:  11:30 PM  Group Topic/Focus:  Wrap-Up Group:   The focus of this group is to help patients review their daily goal of treatment and discuss progress on daily workbooks.    Participation Level:  Active  Participation Quality:  Appropriate  Affect:  Appropriate  Cognitive:  Alert  Insight: Appropriate  Engagement in Group:  Engaged  Modes of Intervention:  Discussion  Additional Comments:    Maeola Harman 02/10/2023, 11:30 PM

## 2023-02-10 NOTE — BHH Counselor (Signed)
CSW looked into shelter beds for patient.  CSW was informed of the following:   [facility]: No bed available  [facility], [phone] Bed is available, patient would have to agree to work after 2 weeks so pay rent.  Patient declined to get a job.  Best the shelter can offer is the 2 weeks of a bed, however, recommended that elsewhere is found since patient would have to leave in 2 weeks without employment.  CSW contacted [facility] [place] who reports that there is not a shelter in [place], she recommended CSW contact 211 for additional assistance in what is local.  [facility] Help for [place], [phone] answer and VM box full [facility] [place], [phone]--[place] HIPAA compliant voicemail  Penni Homans, MSW, LCSW 02/10/2023 3:20 PM

## 2023-02-11 DIAGNOSIS — F333 Major depressive disorder, recurrent, severe with psychotic symptoms: Principal | ICD-10-CM | POA: Diagnosis present

## 2023-02-11 DIAGNOSIS — F1210 Cannabis abuse, uncomplicated: Secondary | ICD-10-CM | POA: Diagnosis not present

## 2023-02-11 DIAGNOSIS — F332 Major depressive disorder, recurrent severe without psychotic features: Secondary | ICD-10-CM | POA: Diagnosis not present

## 2023-02-11 MED ORDER — FAMOTIDINE 20 MG PO TABS: 20.0000 mg | ORAL_TABLET | Freq: Every day | ORAL | 0 refills | Status: AC | PRN

## 2023-02-11 MED ORDER — GABAPENTIN 300 MG PO CAPS: 900.0000 mg | ORAL_CAPSULE | Freq: Three times a day (TID) | ORAL | 0 refills | Status: DC

## 2023-02-11 MED ORDER — QUETIAPINE FUMARATE 300 MG PO TABS: 300.0000 mg | ORAL_TABLET | Freq: Every day | ORAL | 0 refills | Status: DC

## 2023-02-11 MED ORDER — CYCLOBENZAPRINE HCL 10 MG PO TABS: 10.0000 mg | ORAL_TABLET | Freq: Three times a day (TID) | ORAL | 0 refills | Status: DC | PRN

## 2023-02-11 MED ORDER — DILTIAZEM HCL ER COATED BEADS 240 MG PO CP24: 240.0000 mg | ORAL_CAPSULE | Freq: Every day | ORAL | 0 refills | Status: DC

## 2023-02-11 MED ORDER — TRAMADOL HCL 50 MG PO TABS: 50.0000 mg | ORAL_TABLET | Freq: Two times a day (BID) | ORAL | 0 refills | Status: DC | PRN

## 2023-02-11 MED ORDER — ESCITALOPRAM OXALATE 20 MG PO TABS: 20.0000 mg | ORAL_TABLET | Freq: Every day | ORAL | 0 refills | Status: AC

## 2023-02-11 MED ORDER — RISPERIDONE 0.5 MG PO TABS: 0.5000 mg | ORAL_TABLET | ORAL | 0 refills | Status: DC

## 2023-02-11 MED ORDER — METFORMIN HCL ER 500 MG PO TB24: 500.0000 mg | ORAL_TABLET | Freq: Every day | ORAL | 0 refills | Status: DC

## 2023-02-11 MED ORDER — CLONIDINE HCL 0.1 MG PO TABS: 0.1000 mg | ORAL_TABLET | Freq: Two times a day (BID) | ORAL | 11 refills | Status: DC

## 2023-02-11 MED ORDER — NICOTINE 14 MG/24HR TD PT24: 14.0000 mg | MEDICATED_PATCH | Freq: Every day | TRANSDERMAL | 0 refills | Status: AC

## 2023-02-11 MED ORDER — AMITRIPTYLINE HCL 25 MG PO TABS: 25.0000 mg | ORAL_TABLET | Freq: Every day | ORAL | 0 refills | Status: DC

## 2023-02-11 NOTE — Progress Notes (Signed)
[facility] arrived for transport home. Patient escorted to the exit by [facility] with discharge paperwork including his prescriptions and a taxi voucher.  Belongings verified and given to patient.

## 2023-02-11 NOTE — Group Note (Signed)
Recreation Therapy Group Note   Group Topic:Healthy Support Systems  Group Date: 02/11/2023 Start Time: 1100 End Time: 1150 Facilitators: Rosina Lowenstein, LRT, CTRS Location:  Dayroom  Group Description: Straw Bridge. In groups or individually, patients were given 10 plastic drinking straws and an equal length of masking tape. Using the materials provided, patients were instructed to build a free-standing bridge-like structure to suspend an everyday item (ex: deck of cards) off the floor or table surface. All materials were required to be used in Secondary school teacher. LRT facilitated post-activity discussion reviewing the importance of having strong and healthy support systems in our lives. LRT discussed how the people in our lives serve as the tape and the deck of cards we placed on top of our straw structure are the stressors we face in daily life. LRT and pts discussed what happens in our life when things get too heavy for [place], and we don't have strong supports outside of the hospital. Pt shared 2 of their healthy supports in their life aloud in the group.   Goal Area(s) Addressed:  Patient will identify 2 healthy supports in their life. Patient will identify skills to successfully complete activity. Patient will identify correlation of this activity to life post-discharge.  Patient will build on frustration tolerance skills. Patient will increase team building and communication skills.    Affect/Mood: Appropriate   Participation Level: Active and Engaged   Participation Quality: Independent   Behavior: Appropriate, Calm, and Cooperative   Speech/Thought Process: Coherent   Insight: Good   Judgement: Good   Modes of Intervention: Education, Group work, Guided Discussion, Dentist, and Team-building   Patient Response to Interventions:  Attentive, Engaged, Interested , and Receptive   Education Outcome:  Acknowledges education   Clinical Observations/Individualized Feedback:  Travis [name] was active in their participation of session activities and group discussion. Pt identified "my family and my little dog" as his healthy supports. Pt spontaneously contributed to group discussion while interacting well with LRT and peers duration of session.    Plan: Continue to engage patient in RT group sessions 2-3x/week.   Rosina Lowenstein, LRT, CTRS 02/11/2023 1:53 PM

## 2023-02-11 NOTE — Plan of Care (Signed)
  Problem: Education: Goal: Emotional status will improve Outcome: Progressing Goal: Mental status will improve Outcome: Progressing  Travis [name] has been pleasant this shift stated he was excited about going home. Endorse anxiety due to getting discharged. Prn Ativan given at HS and reported effective. Prn Tramadol for back pain given also and reported effective. Support provided as needed.

## 2023-02-11 NOTE — Progress Notes (Signed)
  [facility] Adult Case Management Discharge Plan :  Will you be returning to the same living situation after discharge:  No. Pt will live with his little sister  At discharge, do you have transportation home?: Yes,  CSW will assist with transportation  Do you have the ability to pay for your medications: Yes,  [place] MEDICAID PREPAID HEALTH PLAN / [place] MEDICAID HEALTHY BLUE  Release of information consent forms completed and in the chart;  [name].  [name]:  Follow-up Information     [facility] Go on 02/14/2023.   Why: Your appointment is scheduled for 12/13 at 9:30 via the phone and will receive a call at [phone] Contact information: [address]  [address] [name] [address] [phone]                 Next level of care provider has access to [facility] Link:no  Safety Planning and Suicide Prevention discussed: Yes,  CSW went over SPE with brochure      Has patient been referred to the Quitline?: Patient refused referral for treatment  Patient has been referred for addiction treatment: Yes, the patient will follow up with an outpatient provider for substance use disorder. Psychiatrist/APP: appointment made  Elza Rafter, LCSWA 02/11/2023, 10:53 AM

## 2023-02-11 NOTE — Plan of Care (Signed)
Patient is awaiting discharge set for later this afternoon.  Calm and cooperative, patiently waiting.  Will continue to monitor.

## 2023-02-11 NOTE — Group Note (Unsigned)
Date:  02/11/2023 Time:  2:53 PM  Group Topic/Focus:  Movement Therapy     Participation Level:  {BHH PARTICIPATION GEXBM:84132}  Participation Quality:  {BHH PARTICIPATION QUALITY:22265}  Affect:  {BHH AFFECT:22266}  Cognitive:  {BHH COGNITIVE:22267}  Insight: {BHH Insight2:20797}  Engagement in Group:  {BHH ENGAGEMENT IN GMWNU:27253}  Modes of Intervention:  {BHH MODES OF INTERVENTION:22269}  Additional Comments:  ***  [name] 02/11/2023, 2:53 PM

## 2023-02-11 NOTE — Discharge Summary (Signed)
Physician Discharge Summary Note  Patient:  Travis [name] is an 57 y.o., male MRN:  [id number] DOB:  1966-01-04 Patient phone:  [phone] (home)  Patient address:   [address] [name] [address],   Date of Admission:  01/23/2023 Date of Discharge: 02/11/2023  Reason for Admission:  Travis [name] is a 57 y/o male patient with Psychiatry history of anxiety, MDD, PTSD, Suicide attempt, and Polysubstance abuse admitted to continuous assessment unit after presenting from [facility] ED where he initially presented via EMS from the [facility]) with complaints of paranoia, off meds for 3 days, and trouble trusting himself.   Principal Problem: MDD (major depressive disorder), recurrent, severe, with psychosis (HCC) Discharge Diagnoses: Principal Problem:   MDD (major depressive disorder), recurrent, severe, with psychosis (HCC) Active Problems:   Major depressive disorder, recurrent episode, severe (HCC)   Methamphetamine abuse (HCC)   Mild tetrahydrocannabinol (THC) abuse   Past Psychiatric History: anxiety, MDD, PTSD, Suicide attempt, and Polysubstance abuse   Past Medical History:  Past Medical History:  Diagnosis Date   Anxiety    Panic attacks   Chest pain    Hospital, March, 2014, negative enzymes, patient refused in-hospital  stress test, patient canceled outpatient stress test   Constipation    Degenerative disk disease    Degenerative disk disease    Depression    GERD (gastroesophageal reflux disease)    Hypertension    Incontinence of urine    Neuromuscular disorder (HCC)    Obesity    Polysubstance abuse (HCC)    PTSD (post-traumatic stress disorder)    Pulmonary emboli (HCC)    Seizures (HCC)    Shortness of breath dyspnea    with exertion   Spinal stenosis    Spinal stenosis    Suicide attempt (HCC)    Tachycardia - pulse    Tobacco abuse     Past Surgical History:  Procedure Laterality Date   COLONOSCOPY N/A 08/24/2014    Procedure: COLONOSCOPY;  Surgeon: Charlott Rakes, MD;  Location: [facility] ENDOSCOPY;  Service: Endoscopy;  Laterality: N/A;   ESOPHAGOGASTRODUODENOSCOPY (EGD) WITH PROPOFOL N/A 08/24/2014   Procedure: ESOPHAGOGASTRODUODENOSCOPY (EGD) WITH PROPOFOL;  Surgeon: Charlott Rakes, MD;  Location: [facility] ENDOSCOPY;  Service: Endoscopy;  Laterality: N/A;   WISDOM TOOTH EXTRACTION     Family History:  Family History  Problem Relation Age of Onset   Stroke Mother    Hypertension Mother    Hyperlipidemia Mother    Stroke Father    Hypertension Father    Dementia Father    Diabetes Father    Heart disease Father    Hyperlipidemia Father    Cancer Sister        brain   Diabetes Sister    Heart disease Sister    Hyperlipidemia Sister    Hypertension Sister    Stroke Sister    Heart disease Brother    Hyperlipidemia Brother     Social History:  Social History   Substance and Sexual Activity  Alcohol Use No     Social History   Substance and Sexual Activity  Drug Use Not Currently   Types: Amphetamines, Marijuana, Benzodiazepines    Social History   Socioeconomic History   Marital status: Divorced    Spouse name: Not on file   Number of children: Not on file   Years of education: Not on file   Highest education level: Not on file  Occupational History   Not on file  Tobacco  Use   Smoking status: Every Day    Current packs/day: 0.00    Types: Cigarettes    Start date: 12/12/2016    Last attempt to quit: 12/12/2016    Years since quitting: 6.1   Smokeless tobacco: Never  Vaping Use   Vaping status: Every Day   Substances: Nicotine  Substance and Sexual Activity   Alcohol use: No   Drug use: Not Currently    Types: Amphetamines, Marijuana, Benzodiazepines   Sexual activity: Never  Other Topics Concern   Not on file  Social History Narrative   Not on file   Social Determinants of Health   Financial Resource Strain: High Risk (01/09/2023)   Received from [facility]    Overall Financial Resource Strain (CARDIA)    Difficulty of Paying Living Expenses: Hard  Food Insecurity: Food Insecurity Present (01/23/2023)   Hunger Vital Sign    Worried About Running Out of Food in the Last Year: Sometimes true    Ran Out of Food in the Last Year: Sometimes true  Transportation Needs: Unmet Transportation Needs (01/23/2023)   PRAPARE - Administrator, Civil Service (Medical): Yes    Lack of Transportation (Non-Medical): Yes  Physical Activity: Inactive (01/09/2023)   Received from [facility]   Exercise Vital Sign    Days of Exercise per Week: 0 days    Minutes of Exercise per Session: 10 min  Stress: Stress Concern Present (01/09/2023)   Received from [facility] of Occupational Health - Occupational Stress Questionnaire    Feeling of Stress : Rather much  Social Connections: Moderately Integrated (01/09/2023)   Received from [facility]   Social Network    How would you rate your social network (family, work, friends)?: Adequate participation with social networks    Hospital Course:  HOSPITAL COURSE: The patient was admitted to Inpatient psychiatric treatment for stabilization of depression and suicide thoughts. Patient was placed on suicidal precautions. The patient was evaluated and treated by the multidisciplinary treatment team including physicians, nurses, social workers and therapists. All medications were presented to the patient and the Patient gave consent to all the medications that they were given, as well as was explained the risks, benefits, side effects and alternatives of all medication therapies. The patient was integrated into the general milieu on the ward and encouraged to attend to his ADLs and participate in all groups and activities. During hospital course the Patient attended coping skill groups, music therapy and activity therapy groups. Patient was counseled on cognitive techniques/skills by multiple staff  members and given support care by the staff.   Patient's medication regimen was evaluated and titrated to therapeutic levels to better Patient's overall daily functioning. Specifically, the patient was started on Seroquel, Lexapro, and amitriptyline.later on risperidone was added by Dr. Marlou Porch.  Patient tolerated the medication well with no significant side effects.  During the hospitalization, the patient demonstrated a stabilization of mood with decreased racing thoughts, decreased impulsivity, improved sleep and decreased irritability. At the time of discharge, the patient denied any suicidal ideation/homicidal ideation and was not overtly depressed, manic or psychotic. The Patient was interacting well in groups and on the unit with their peers. Patient was able to identify a safety plan to include speaking with family, contacting outpatient provider or calling 911 if hallucinations/delusions returned or worsened or thoughts of self-harm or suicide return. Patient was counselled on outpatient follow-up that was arranged prior to discharge.   Blood pressure Marland Kitchen)  134/93, pulse 77, temperature 98.2 F (36.8 C), resp. rate 16, height 6' (1.829 m), weight 117 kg, SpO2 96%. Body mass index is 34.99 kg/m.   Social History   Tobacco Use  Smoking Status Every Day   Current packs/day: 0.00   Types: Cigarettes   Start date: 12/12/2016   Last attempt to quit: 12/12/2016   Years since quitting: 6.1  Smokeless Tobacco Never   Tobacco Cessation:  A prescription for an FDA-approved tobacco cessation medication provided at discharge   Blood Alcohol level:  Lab Results  Component Value Date   [facility] <10 01/20/2023   ETH <10 02/28/2018    Metabolic Disorder Labs:  Lab Results  Component Value Date   HGBA1C 5.8 (H) 01/31/2023   MPG 119.76 01/31/2023   MPG 123 (H) 05/29/2012   No results found for: "PROLACTIN" Lab Results  Component Value Date   CHOL 140 02/03/2023   TRIG 174 (H) 02/03/2023    HDL 30 (L) 02/03/2023   CHOLHDL 4.7 02/03/2023   VLDL 35 02/03/2023   LDLCALC 75 02/03/2023   LDLCALC 73 05/30/2012    See Psychiatric Specialty Exam and Suicide Risk Assessment completed by Attending Physician prior to discharge.  Discharge destination:  Home  Is patient on multiple antipsychotic therapies at discharge:  Yes,   Do you recommend tapering to monotherapy for antipsychotics?  Yes      Allergies as of 02/11/2023       Reactions   Vaccinium Angustifolium Anaphylaxis, Hives   Blueberry Flavoring Agent (non-screening) Hives   Penicillins Hives, Itching, Other (See Comments)   Hallucinations. Has patient had a PCN reaction causing immediate rash, facial/tongue/throat swelling, SOB or lightheadedness with hypotension:YES Has patient had a PCN reaction causing severe rash involving mucus membranes or skin necrosis: NO Has patient had a PCN reaction that required hospitalization: yes Has patient had a PCN reaction occurring within the last 10 years: NO If all of the above answers are "NO", then may proceed with Cephalosporin use. * Zosyn x 1 03/24/21. No rxn noted.   Robaxin [methocarbamol] Palpitations   Toradol [ketorolac Tromethamine] Hives, Other (See Comments)   Headache, pt states that he is not allergic        Medication List     STOP taking these medications    acetaminophen 500 MG tablet Commonly known as: TYLENOL   Claritin 10 MG tablet Generic drug: loratadine   gabapentin 600 MG tablet Commonly known as: NEURONTIN Replaced by: gabapentin 300 MG capsule   lisinopril 20 MG tablet Commonly known as: ZESTRIL   metoprolol tartrate 100 MG tablet Commonly known as: LOPRESSOR   pantoprazole 40 MG tablet Commonly known as: Protonix   sertraline 25 MG tablet Commonly known as: ZOLOFT       TAKE these medications      Indication  amitriptyline 25 MG tablet Commonly known as: ELAVIL Take 1 tablet (25 mg total) by mouth at bedtime.     cloNIDine 0.1 MG tablet Commonly known as: CATAPRES Take 1 tablet (0.1 mg total) by mouth 2 (two) times daily.    cyclobenzaprine 10 MG tablet Commonly known as: FLEXERIL Take 1 tablet (10 mg total) by mouth 3 (three) times daily as needed for muscle spasms. What changed:  when to take this reasons to take this    diltiazem 240 MG 24 hr capsule Commonly known as: CARDIZEM CD Take 1 capsule (240 mg total) by mouth daily. Start taking on: February 12, 2023    escitalopram  20 MG tablet Commonly known as: LEXAPRO Take 1 tablet (20 mg total) by mouth daily after breakfast. Start taking on: February 12, 2023    famotidine 20 MG tablet Commonly known as: PEPCID Take 1 tablet (20 mg total) by mouth daily as needed for heartburn or indigestion.    gabapentin 300 MG capsule Commonly known as: NEURONTIN Take 3 capsules (900 mg total) by mouth 3 (three) times daily. Replaces: gabapentin 600 MG tablet    metFORMIN 500 MG 24 hr tablet Commonly known as: GLUCOPHAGE-XR Take 1 tablet (500 mg total) by mouth daily with breakfast. Start taking on: February 12, 2023    nicotine 14 mg/24hr patch Commonly known as: NICODERM CQ - dosed in mg/24 hours Place 1 patch (14 mg total) onto the skin daily. Start taking on: February 12, 2023    QUEtiapine 300 MG tablet Commonly known as: SEROQUEL Take 1 tablet (300 mg total) by mouth at bedtime. What changed: medication strength    risperiDONE 0.5 MG tablet Commonly known as: RISPERDAL Take 1 tablet (0.5 mg total) by mouth 2 (two) times daily at 8 am and 4 pm.    traMADol 50 MG tablet Commonly known as: ULTRAM Take 1 tablet (50 mg total) by mouth every 12 (twelve) hours as needed for severe pain (pain score 7-10).         Follow-up Information     [facility] on 02/14/2023.   Why: Your appointment is scheduled for 12/13 at 9:30 via the phone and will receive a call at [phone] Contact information: [address]  [address] [name] [address] [phone]                PATIENTS CONDITION AT DISCHARGE:  Stable  TOBACCO CESSATION SCREENING  Patient was screened and counselled on smoking cessation at time of discharge.   PRESCRIPTION ARE LOCATED:  On Chart   DISCHARGE INSTRUCTIONS:  1. Diet: Diabetic and cardiac healthy  2. Activity: As tolerated  3. Take medications as prescribed and not to make any changes without first consulting with the outpatient provider.  4. Patient was advised to avoid any illicit drugs or alcohol due to negative impact on physical and mental health.  5. Patient should keep all follow up appointments.   TIME SPENT ON DISCHARGE: Over 35 minutes were spent on this patients discharge including a face to face encounter, patient counseling and preparation of discharge materials.  Signed: Lewanda Rife, MD 02/11/2023, 11:57 AM

## 2023-02-11 NOTE — [facility] Suicide Risk Assessment (Signed)
[facility] Discharge Suicide Risk Assessment   Principal Problem: Major depressive disorder, recurrent episode, severe (HCC) Discharge Diagnoses: Principal Problem:   Major depressive disorder, recurrent episode, severe (HCC) Active Problems:   Methamphetamine abuse (HCC)   Mild tetrahydrocannabinol (THC) abuse    Musculoskeletal: Strength & Muscle Tone: within normal limits Gait & Station:  WNL Patient leans: N/A   Psychiatric Specialty Exam:   Presentation  General Appearance:  Appropriate for Environment   Eye Contact: Good   Speech: Clear and Coherent; Normal Rate   Speech Volume: Normal   Handedness: Right     Mood and Affect  Mood: "Good"   Affect: Animated and full range     Thought Process  Thought Processes: Coherent; Goal Directed   Descriptions of Associations:Intact   Orientation:Full (Time, Place and Person)   Thought Content:Logical; WDL   History of Schizophrenia/Schizoaffective disorder:No   Duration of Psychotic Symptoms:Greater than six months   Hallucinations:Denies Ideas of Reference:None   Suicidal Thoughts:Denies Homicidal Thoughts:Denies   Sensorium  Memory: Immediate Fair; Recent Good   Judgment: Intact   Insight: Improving     Executive Functions  Concentration: Good   Attention Span: Good   Recall: Good   Fund of Knowledge: Good   Language: Good     Psychomotor Activity  Psychomotor Activity:Normal   Assets  Assets: Communication Skills; Desire for Improvement; Financial Resources/Insurance; Leisure Time; Physical Health     Sleep  Sleep:Fair     Physical Exam: Physical Exam HENT:     Head: Normocephalic and atraumatic.     Nose: Nose normal.  Eyes:     Pupils: Pupils are equal, round, and reactive to light.  Pulmonary:     Effort: Pulmonary effort is normal.     Breath sounds: Normal breath sounds.  Skin:    General: Skin is warm.  Neurological:     General: No focal deficit  present.     Mental Status: He is alert and oriented to person, place, and time.      Review of Systems  Constitutional:  Negative for fever.  HENT:  Negative for hearing loss and sore throat.   Respiratory:  Negative for cough and shortness of breath.   Cardiovascular:  Negative for chest pain and palpitations.  Gastrointestinal:  Negative for heartburn, nausea and vomiting.  Neurological:  Negative for dizziness and speech change.   Blood pressure (!) 134/93, pulse 77, temperature 98.2 F (36.8 C), resp. rate 16, height 6' (1.829 m), weight 117 kg, SpO2 96%. Body mass index is 34.99 kg/m.  Mental Status Per Nursing Assessment::   On Admission:  Suicidal ideation indicated by patient  Demographic Factors:  Male, Caucasian, and Unemployed  Loss Factors: Decrease in vocational status and Financial problems/change in socioeconomic status  Historical Factors: Impulsivity  Risk Reduction Factors:   Positive therapeutic relationship and Positive coping skills or problem solving skills  Continued Clinical Symptoms:  Alcohol/Substance Abuse/Dependencies Previous Psychiatric Diagnoses and Treatments  Cognitive Features That Contribute To Risk:  Thought constriction (tunnel vision)    Suicide Risk:  Minimal: No identifiable suicidal ideation.     Follow-up Information     [facility] on 02/14/2023.   Why: Your appointment is scheduled for 12/13 at 9:30 via the phone and will receive a call at [phone] Contact information: [address]  [address] [name] [address] [phone]                 Plan Of Care/Follow-up recommendations:  Per  Discharge Summary  Lewanda Rife, MD 02/11/2023, 10:29 AM

## 2023-02-11 NOTE — Group Note (Signed)
Recreation Therapy Group Note   Group Topic:Other  Group Date: 02/11/2023 Start Time: 1500 End Time: 1540 Facilitators: Rosina Lowenstein, LRT, CTRS Location: Courtyard  Group Description: Leisure. Patients were given the opportunity to play ring toss, play corn hole, or listen to music while sitting in the courtyard getting fresh air and sunlight. Pt identified and conversated about things they enjoy doing in their free time and how they can continue to do that outside of the hospital.  Goal Area(s) Addressed: Patient will learn the definition of "leisure". Patient will practice making a positive decision. Patient will have the opportunity to try a new leisure activity. Patient will communicate with peers and LRT.   Affect/Mood: N/A   Participation Level: Did not attend    Clinical Observations/Individualized Feedback: Travis [name] did not attend group.   Plan: Continue to engage patient in RT group sessions 2-3x/week.   Rosina Lowenstein, LRT, CTRS 02/11/2023 5:08 PM

## 2023-09-20 NOTE — Progress Notes (Signed)
 [facility] Occupational Therapy - Exception to Therapy  Patient Name:  Travis [name] Date of Birth:  24-Nov-1965  Today's Date: September 20, 2023  Charting Type: Evaluation Exception OT session unable to be completed secondary to: medical issues/pending test results (PT saw pt earlier and pt with diarrhea X 4 during PT session and limited session. OT will check back later today or next in hopes that diarrhea would resolve.)     Will continue efforts as appropriate.   Time treatment/evaluation attempted: 1025  Charges:  $ Attempted Patient Visit OT: 1 Unit  Summer B Henson, OTR/L 09/20/2023 10:25 AM

## 2023-09-23 NOTE — Progress Notes (Signed)
 ------------------------------------------------------------------------------- Attestation signed by Bettyann ONEIDA Medicine, MD at 09/23/23 1431 The above recommendations reflect my personal assessment. -------------------------------------------------------------------------------  [facility]  Neurology Progress Note  Patient Name: Travis [name] Patient DOB: 03-15-1965 Patient Age/Gender: 58 y.o. male MRN: [id number] Room/Bed: [facility] T2891/T2891-98 Admission Date/Time: 09/19/2023  8:56 PM Date: 09/23/2023 Code Status: Full Code  Assessment/Plan   History of Present Illness: Travis [name] is a 58 y.o. male with past medical history of hypertension, hyperlipidemia, self reported history of atrial fibrillation but no documented evidence and not on anticoagulation, remote seizure history, former smoker and current vaper, history of substance abuse, anxiety/depression, schizoaffective disorder, history of prior suicide attempts and self cutting, who presented to [facility] ED with slurred speech, ataxia, and confusion. He was visiting his sister at [facility], and when he awoke from a nap, his family noted that he was acting funny and had slurred speech. He was taken to the ED, and was unable to remember who he was visiting, and was noted to have an ataxic gait, lower extremity weakness, and slurred speech. He was evaluated by Teleneurology and NIH was 7. CT head showed no acute findings, and he was given Tenecteplase (TNK). 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 He was not considered a candidate for thrombectomy. He was transferred to [facility] and admitted to the NSICU.   - Neurological:  1) Stroke aborted with Tenecteplase - Was noted to have dysarthria, ataxia, sensory loss, and LE weakness was visiting his family member at [facility] - Evaluated by teleneurology and given IV TNK at [facility] - Stroke  Risk Factors: Hypertension, Hyperlipidemia, Obesity, possible Atrial fibrillation, and tobacco abuse  2) Bilateral occipital neuralgia, improving - Valium  5 mg IV TID x 1 day - Hot packs as needed  Diagnostic Work-up: - Initial CT Head: No acute intracranial abnormality - CTA Head/Neck: No large vessel occlusion with fusiform aneurysmal dilatation of the proximal basilar artery measuring up to 6 mm.  - Repeat CT Head (7/18): No acute intracranial abnormality. Confluent soft tissue within the RIGHT cavernous sinus as above. MRI recommended for further evaluation. The differential includes fusiform aneurysm and potential meningioma. - MRI brain wo contrast:  No acute intracranial abnormality. 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  - 24-hour post-TNK CT Head (7/19): No definite acute intracranial abnormality appreciated. Specifically, no intracranial hemorrhage appreciated.  - ECG: NSR  - Transthoracic Echocardiogram (with bubble): Showed a left ventricle ejection fraction of 60-65% with normal wall motion and no interatrial shunt noted - MRI Head w/wo contrast (skull base protocol): ordered as recommended by radiology and pending  - Hemoglobin A1C: 6.0% (Goal <7.0%) - LDL: 82 (Goal <70) - Trig: 205 - Urine drug screen: Positive for benzos, cannabinoid, and oxycodone  (Patient was not given these meds prior to this UDS, and these medications are not prescribed. Patient admits to taking non prescribed oxycodone  for pain). Similar findings on UDS noted during visit to [facility] on 03/05/2023 - CT Head Non-Con will be repeated with any significant neurological change this admission   Therapeutic: - Antiplatelet: Aspirin  81 mg for secondary stroke prevention due to stroke risk factors - High intensity statin therapy not given due to reported history of liver disease   2) Fusiform Aneurysm proximal  Basilar artery   - Follow up with outpatient Neuro IR  3) History of Seizure (2016)             - Associated with benzodiazepine withdraw    - No AEDs  4) Acute Headache 5) Chronic back pain - No PRN Tylenol  per patient due to fatty liver - Restarted home Neurontin  1200mg  TID - Reportedly takes Tramadol  at baseline and restarted in ICU   6) Impaired Mobility - PT/OT consulted and recommends multiple times per week or skilled nursing rehabilitation   7) Adjustment disorder with mixed anxiety and depressed mood 8) Hx of Deliberate self-cutting 9) Hx of suicide attempt 10) Hx of substance abuse             - Continue home Seroquel  400 mg q HS             - UDS positive for cannabinoid, benzos, and oxycodone . This was not home meds. March 05 2023 history of OD of tramadol  for suicide attempt.    - Cardiovascular:  Vitals:   09/22/23 1529 09/22/23 1912 09/23/23 0402 09/23/23 0715  BP: (!) 164/96 (!) 153/98 107/83 (!) 139/96  BP Location: Left Upper Arm Left Upper Arm Left Upper Arm Left Upper Arm  Patient Position: Lying Lying Lying Lying  Pulse: 86 79 78 77  Resp: 16 18 19 19   Temp: 98.1 F (36.7 C) 97.5 F (36.4 C) 98 F (36.7 C) 98.1 F (36.7 C)  TempSrc: Axillary Oral Oral Oral  SpO2: 95% 93% 95% 95%  Weight:      Height:        1) Hx of Atrial Fibrillation?, self reported   - Hx unclear, reportedly over 10 yrs ago, no no documented evidence  - EKG w/ NSR  2) HTN, Essential 3) Hypotensive Episodes, resolved  - Goal SBP < 160  - Home Lisinopril  and Metoprolol  on hold due to hypotensive episodes. 1L Fluid bolus given (7/20) due to BP 74/46, and increased IVF therapy to 100 mL/hr. BP increased to 124/75 after fluid bolus.  - Will monitor BP  3) Mixed Hyperlipidemia  - Total chol 153, LDL 82, Trig 205, HDL 30  - Not on AEDs. Unable to start statin with reported liver difficulty  - Respiratory:  1) Tobacco use  - RA, 02 sats 95%  - Reportedly smokes 2ppd  - On  nicotine  patch per patient request  - Gastroenteric:  1) Obesity, BMI 34.77 2) Diarrhea  - Cardiac diet  - Cdiff negative  - Imodium  prn  - Last BM Date: 09/22/23 (per pt)  - Endocrine:  - HgbA1c 6.0  - Goal BG 130-180   - Glucosurveillance  - TSH  - Renal/Electrolytes:  Recent Labs    Units 09/23/23 0654 09/22/23 0151  NA mmol/L 137 135*  K mmol/L 4.2 3.8  BUN mg/dL 13 16  CREATININE mg/dL 9.24* 8.59*  GLUCOSE mg/dL 82 92  CALCIUM  mg/dL 7.6* 8.1*  CL mmol/L 891 105  CO2 mmol/L 22 22  MAGNESIUM  mg/dL  --  1.9  PHOS mg/dL  --  3.0   1) Hypokalemia  - Will replace potassium with goal 4.0-4.5  - Check in am - Will monitor BMP, electrolytes while admitted for optimization.   - Infectious:  Recent Labs    Units 09/23/23 0654  WBC thou/mcL 5.4   Temp (24hrs), Avg:97.9 F (36.6 C), Min:97.5 F (36.4 C), Max:98.1 F (36.7 C)   - No abx  - Afebrile  - UA negative  - CXR ordered  - Hematologic:  Recent Labs  Units 09/23/23 0654  HGB gm/dL 87.3*  HCT % 59.3  PLT thou/mcL 106*   1) Anemia 2) Thrombocytopenia  - Will monitor CBC  - Will check vitamin B12 and folate levels  - Prophylaxis: Sequential Compression Device, and Lovenox  Andrews   Interval History   09/23/2023:   No acute events overnight. This a.m. sitting up in bed with no acute distress noted.  Occipital neuralgia much improved.  Continues to complain of chronic pain and requests pain medicine. Labs, imaging, documentation, vital signs, allergies, and medications were reviewed.  09/22/2023 No acute events overnight. This a.m. patient is lying in the bed complaining of severe neck pain with movement (I slept on my neck wrong last night) and back pain.  He denied headache, blurred vision, chest pain, shortness of breath, cough, nausea, vomiting, diarrhea, urinary symptoms, and rash. Labs, imaging, documentation, vital signs, allergies, and medications were reviewed and family was updated at the  bedside.   09/21/2023 Pt transitioned out of the Neuro ICU to the Neurology floor last night. Pt noted to be hypotensive this am, and fluid bolus given. Nursing noted that patient had multiple loose stools last night, and likely suffered from fluid loss. Patient seen and examined this am with Dr. Darren. He is lying in bed, lethargic but arousable and cooperative with exam. He reports chronic back pain. No new complaints this am.  Review of systems: No new neurological complaints. C/o back pain this am  Medications   Infusions:  . NaCl 100 mL/hr (09/22/23 2041)  . NaCl 75 mL/hr (09/23/23 0914)   Intake/Output:  Intake/Output Summary (Last 24 hours) at 09/23/2023 1419 Last data filed at 09/22/2023 1912 Gross per 24 hour  Intake 240 ml  Output --  Net 240 ml   Medications:  . aspirin   81 mg Oral Daily  . enoxaparin   40 mg Subcutaneous Q24H  . fish oil  2 capsule Oral BID  . gabapentin   1,200 mg Oral TID  . [Held by Provider] lisinopril   20 mg Oral Daily  . [Held by Provider] metoprolol  tartrate  100 mg Oral BID  . nicotine   1 patch Transdermal Daily  . QUEtiapine  fumarate  400 mg Oral HS   PRN meds: acetaminophen  Oral **OR** acetaminophen  (TYLENOL ) oral liquid Tube **OR** acetaminophen  Rectal,hydrALAZINE  HCl IntraVENous,labetalol IntraVENous,loperamide  Oral,ondansetron  IntraVENous,oxyCODONE  HCl Oral,sodium phosphate  Rectal,traMADol  Oral  Physical Examination  Vent:     O2 Device: None (Room air)  Temp:  [97.5 F (36.4 C)-98.1 F (36.7 C)] 98.1 F (36.7 C) Heart Rate:  [77-86] 77 Resp:  [16-19] 19 BP: (107-164)/(83-98) 139/96 SpO2:  [93 %-95 %] 95 %  Pain Score:   8  RASS: [place] Agitation Sedation Scale (RASS): Alert and calm  GCS: Eye Opening: Spontaneous Verbal Response: Oriented Best Motor Response: Obey commands Glasgow Coma Scale Score: 15 Glasgow Coma Scale Breakdown: E=4, V=5, M=6  Last Weight  09/19/23 116.3 kg (256 lb 6.3 oz)   Intakes & Outputs (Last  24 hours) at 09/23/2023 0700 Last data filed at 09/22/2023 1912 24hr Volume @0700   Intake 240 mL  Output --  Net 240 mL  In: 240 [P.O.:240] Out: -       General:  Adult male sitting up in bed, Well Developed, Well Nourished, and No acute distress Head, Eyes, Ears, Nose, Throat: Normocephalic, atraumatic, Conjunctiva normal, Oropharynx moist and clear Neck: Bilateral left greater than right occipital nerve pain much improved this a.m.     Cardiovascular:  Regular, rate, and rhythm Lungs:  Decreased lower lobes Abdomen:  Bowel sounds Present and Soft Skin:  No notable lesions  Extremities:  No clubbing or cyanosis.  Neurologic: Mental Status: Awake, Alert, and Follows commands Speech: Fluent with mild dysarthria Orientation to: Person, Place, and Time CN II-XII:  Visual: Full to finger counting Pupils: Equal and Reactive EOM: Intact Face: Symmetric Motor: Motor exam not consistent Sensation: Left normal and Right normal Coordination: No Ataxia Present Gait: Unable to assess  Updated 09/23/2023   Results  - Interim history reviewed - Medications reviewed - Management: Beside assessment/intervention - Interpretation: Lab assessment, Metabolic assessment, Blood pressure, Telemetry - Interventions: Systemic hemodynamic management - Exam was updated today.   The assessment and plan portion of this note was copied from the progress note written 09/22/2023 and modified to reflect the current assessment and plan. Changes to vitals, lab results, imaging results, etc., were reviewed in detail and the note was modified to reflect these changes. The content of the note which is not modified is accurate and current to this patients care and this was verified by me today.   This patient was discussed in the IDT rounds involving providers from Neurology, NICS, therapists, case management, nursing team and rehabilitation liaisons.  45 minutes spent in direct care of this patient today,  more than half time involved counseling and coordination of care.    (Please note voice recognition software has been used to dictate this note. Similar sounding words can inadvertently be transcribed and may not be corrected upon review).   Electronically signed: Inocente FORBES Reasoner, FNP 09/23/2023 / 2:19 PM

## 2023-09-24 NOTE — Progress Notes (Signed)
 [facility] - Exception to Therapy  Patient Name:  Travis [name] Date of Birth:  20-Dec-1965  Today's Date: September 24, 2023  Charting Type: Treatment Exception PT session unable to be completed secondary to: patient declined Declined due to: pain level not acceptable to patient for participation in therapy    Chart reviewed, nursing Ok'd. Pt reports wanting to have pain meds with c/o 10/10 back pain and declined PT at this time. Nursing made aware. Anticipate d/c home this date, pt awaiting DME delivery.  Will continue efforts as appropriate.   Time treatment/evaluation attempted: 1350  Charges:  $ Attempted Patient Visit PT: 1 Unit  Lennart Proffer, [place] 09/24/2023 2:57 PM

## 2023-09-24 NOTE — Progress Notes (Signed)
 [facility] Case Management Discharge Note   Patient:   Travis [name] MR Number:  [id number] Patient Date of Birth: 1965-07-21 Age/Sex:  58 y.o./male   Discharge Assessment   Facility/Agency Type: Outpatient rehab  Home Health Services/Durable Medical Equipment at Discharge        Discharge Equipment: Vannie, Bedside Commode, Hospital Bed DME Hospital Bed Status: Arranged for home delivery/Pt pickup ([employer]/ [employer] accepted DME referral and will be delivered to home address -[address]   [name] [address].)  Discharge Assistance     Discharge Transportation: Private vehicle   Discussed in IDT rounds.  Patient discharging home, discussed patient/his sister wants OP therapy and okay with patient discharge home with DME/hospital bed deliver later in week.  CM met with patient at bedside, discussed OP therapy and DME: okay to go home today and hospital bed delivery later in week.  Will have RW and BSC delivered to room prior to DC.  His family member will be coming to take him home.  CM contacted [employer], [facility] accepted DME referral: will have RW/BSC delivered to bedside prior to DC.  Discussed home delivery for hospital bed and contacting his sister Delon prior to delivery: he agreed.   CM provided home address -[address]   [name] [address].   CM update provider and requested OP theapy order.   Case Management has assessed this patient/family or caregiver's readiness, willingness and ability to provide or support self-management activities when needed after discharge from the acute care setting.    Electronically signed: Evert Repine, RN 09/24/2023 1:24 PM

## 2023-09-26 ENCOUNTER — Other Ambulatory Visit: Payer: Self-pay

## 2023-09-26 ENCOUNTER — Observation Stay (HOSPITAL_COMMUNITY)
Admission: EM | Admit: 2023-09-26 | Discharge: 2023-09-29 | Disposition: A | Discharge status: Home / Self Care | Payer: MEDICAID | Attending: Student | Admitting: Student

## 2023-09-26 ENCOUNTER — Encounter (HOSPITAL_COMMUNITY): Payer: Self-pay

## 2023-09-26 ENCOUNTER — Emergency Department (HOSPITAL_COMMUNITY): Payer: MEDICAID

## 2023-09-26 DIAGNOSIS — E785 Hyperlipidemia, unspecified: Secondary | ICD-10-CM | POA: Insufficient documentation

## 2023-09-26 DIAGNOSIS — E7800 Pure hypercholesterolemia, unspecified: Secondary | ICD-10-CM | POA: Diagnosis present

## 2023-09-26 DIAGNOSIS — I10 Essential (primary) hypertension: Secondary | ICD-10-CM | POA: Diagnosis present

## 2023-09-26 DIAGNOSIS — R531 Weakness: Secondary | ICD-10-CM | POA: Diagnosis present

## 2023-09-26 DIAGNOSIS — Z720 Tobacco use: Secondary | ICD-10-CM | POA: Diagnosis present

## 2023-09-26 DIAGNOSIS — G459 Transient cerebral ischemic attack, unspecified: Secondary | ICD-10-CM | POA: Diagnosis not present

## 2023-09-26 DIAGNOSIS — I639 Cerebral infarction, unspecified: Secondary | ICD-10-CM | POA: Diagnosis not present

## 2023-09-26 DIAGNOSIS — F4310 Post-traumatic stress disorder, unspecified: Secondary | ICD-10-CM | POA: Insufficient documentation

## 2023-09-26 DIAGNOSIS — R7401 Elevation of levels of liver transaminase levels: Secondary | ICD-10-CM | POA: Diagnosis not present

## 2023-09-26 DIAGNOSIS — F259 Schizoaffective disorder, unspecified: Secondary | ICD-10-CM | POA: Diagnosis not present

## 2023-09-26 DIAGNOSIS — Z7901 Long term (current) use of anticoagulants: Secondary | ICD-10-CM | POA: Insufficient documentation

## 2023-09-26 DIAGNOSIS — Z79899 Other long term (current) drug therapy: Secondary | ICD-10-CM | POA: Insufficient documentation

## 2023-09-26 DIAGNOSIS — Z8669 Personal history of other diseases of the nervous system and sense organs: Secondary | ICD-10-CM | POA: Diagnosis not present

## 2023-09-26 DIAGNOSIS — R519 Headache, unspecified: Secondary | ICD-10-CM | POA: Insufficient documentation

## 2023-09-26 DIAGNOSIS — F321 Major depressive disorder, single episode, moderate: Secondary | ICD-10-CM | POA: Diagnosis present

## 2023-09-26 DIAGNOSIS — Z6833 Body mass index (BMI) 33.0-33.9, adult: Secondary | ICD-10-CM | POA: Insufficient documentation

## 2023-09-26 DIAGNOSIS — F17210 Nicotine dependence, cigarettes, uncomplicated: Secondary | ICD-10-CM | POA: Diagnosis not present

## 2023-09-26 DIAGNOSIS — E66811 Obesity, class 1: Secondary | ICD-10-CM | POA: Insufficient documentation

## 2023-09-26 DIAGNOSIS — F329 Major depressive disorder, single episode, unspecified: Secondary | ICD-10-CM | POA: Diagnosis not present

## 2023-09-26 DIAGNOSIS — Z8659 Personal history of other mental and behavioral disorders: Secondary | ICD-10-CM | POA: Insufficient documentation

## 2023-09-26 DIAGNOSIS — M1990 Unspecified osteoarthritis, unspecified site: Secondary | ICD-10-CM | POA: Diagnosis not present

## 2023-09-26 DIAGNOSIS — R29709 NIHSS score 9: Secondary | ICD-10-CM

## 2023-09-26 DIAGNOSIS — K219 Gastro-esophageal reflux disease without esophagitis: Secondary | ICD-10-CM | POA: Diagnosis present

## 2023-09-26 DIAGNOSIS — R4781 Slurred speech: Principal | ICD-10-CM

## 2023-09-26 LAB — COMPREHENSIVE METABOLIC PANEL WITH GFR
ALT: 37 U/L (ref 0–44)
AST: 56 U/L — ABNORMAL HIGH (ref 15–41)
Albumin: 3.3 g/dL — ABNORMAL LOW (ref 3.5–5.0)
Alkaline Phosphatase: 74 U/L (ref 38–126)
Anion gap: 9 (ref 5–15)
BUN: 10 mg/dL (ref 6–20)
CO2: 26 mmol/L (ref 22–32)
Calcium: 8.5 mg/dL — ABNORMAL LOW (ref 8.9–10.3)
Chloride: 101 mmol/L (ref 98–111)
Creatinine, Ser: 1.12 mg/dL (ref 0.61–1.24)
GFR, Estimated: >60 mL/min (ref >60)
Glucose, Bld: 105 mg/dL — ABNORMAL HIGH (ref 70–99)
Potassium: 3.7 mmol/L (ref 3.5–5.1)
Sodium: 136 mmol/L (ref 135–145)
Total Bilirubin: 0.8 mg/dL (ref 0.0–1.2)
Total Protein: 7.1 g/dL (ref 6.5–8.1)

## 2023-09-26 LAB — CBC WITH DIFFERENTIAL/PLATELET
Abs Immature Granulocytes: 0.02 K/uL (ref 0.00–0.07)
Basophils Absolute: 0.1 K/uL (ref 0.0–0.1)
Basophils Relative: 1 %
Eosinophils Absolute: 0.3 K/uL (ref 0.0–0.5)
Eosinophils Relative: 4 %
HCT: 43.8 % (ref 39.0–52.0)
Hemoglobin: 13.9 g/dL (ref 13.0–17.0)
Immature Granulocytes: 0 %
Lymphocytes Relative: 41 %
Lymphs Abs: 2.9 K/uL (ref 0.7–4.0)
MCH: 30 pg (ref 26.0–34.0)
MCHC: 31.7 g/dL (ref 30.0–36.0)
MCV: 94.4 fL (ref 80.0–100.0)
Monocytes Absolute: 0.6 K/uL (ref 0.1–1.0)
Monocytes Relative: 8 %
Neutro Abs: 3.3 K/uL (ref 1.7–7.7)
Neutrophils Relative %: 46 %
Platelets: 182 K/uL (ref 150–400)
RBC: 4.64 MIL/uL (ref 4.22–5.81)
RDW: 13.5 % (ref 11.5–15.5)
WBC: 7.1 K/uL (ref 4.0–10.5)
nRBC: 0 % (ref 0.0–0.2)

## 2023-09-26 LAB — CBC
HCT: 38.1 % — ABNORMAL LOW (ref 39.0–52.0)
Hemoglobin: 12.6 g/dL — ABNORMAL LOW (ref 13.0–17.0)
MCH: 30.3 pg (ref 26.0–34.0)
MCHC: 33.1 g/dL (ref 30.0–36.0)
MCV: 91.6 fL (ref 80.0–100.0)
Platelets: 161 K/uL (ref 150–400)
RBC: 4.16 MIL/uL — ABNORMAL LOW (ref 4.22–5.81)
RDW: 13.5 % (ref 11.5–15.5)
WBC: 6.2 K/uL (ref 4.0–10.5)
nRBC: 0 % (ref 0.0–0.2)

## 2023-09-26 LAB — CREATININE, SERUM
Creatinine, Ser: 1 mg/dL (ref 0.61–1.24)
GFR, Estimated: >60 mL/min (ref >60)

## 2023-09-26 LAB — CBG MONITORING, ED: Glucose-Capillary: 111 mg/dL — ABNORMAL HIGH (ref 70–99)

## 2023-09-26 MED ORDER — DIPHENHYDRAMINE HCL 50 MG/ML IJ SOLN
25.0000 mg | Freq: Once | INTRAMUSCULAR | Status: AC
  Administered 2023-09-26: 25 mg via INTRAVENOUS
  Filled 2023-09-26: qty 1

## 2023-09-26 MED ORDER — QUETIAPINE FUMARATE 200 MG PO TABS
400.0000 mg | ORAL_TABLET | Freq: Every day | ORAL | Status: DC
  Administered 2023-09-27 – 2023-09-28 (×3): 400 mg via ORAL
  Filled 2023-09-26 (×3): qty 2

## 2023-09-26 MED ORDER — STROKE: EARLY STAGES OF RECOVERY BOOK
Freq: Once | Status: AC
  Filled 2023-09-26: qty 1

## 2023-09-26 MED ORDER — ACETAMINOPHEN 325 MG PO TABS
650.0000 mg | ORAL_TABLET | ORAL | Status: DC | PRN
  Administered 2023-09-26 – 2023-09-29 (×3): 650 mg via ORAL
  Filled 2023-09-26 (×4): qty 2

## 2023-09-26 MED ORDER — ACETAMINOPHEN 160 MG/5ML PO SOLN: 650.0000 mg | ORAL | Status: DC | PRN

## 2023-09-26 MED ORDER — FENTANYL CITRATE PF 50 MCG/ML IJ SOSY: 50.0000 ug | PREFILLED_SYRINGE | Freq: Once | INTRAMUSCULAR | Status: DC

## 2023-09-26 MED ORDER — ACETAMINOPHEN 650 MG RE SUPP: 650.0000 mg | RECTAL | Status: DC | PRN

## 2023-09-26 MED ORDER — SENNOSIDES-DOCUSATE SODIUM 8.6-50 MG PO TABS: 1.0000 | ORAL_TABLET | Freq: Every evening | ORAL | Status: DC | PRN

## 2023-09-26 MED ORDER — GABAPENTIN 400 MG PO CAPS
1200.0000 mg | ORAL_CAPSULE | Freq: Three times a day (TID) | ORAL | Status: DC
  Administered 2023-09-27 – 2023-09-29 (×8): 1200 mg via ORAL
  Filled 2023-09-26 (×8): qty 3

## 2023-09-26 MED ORDER — DILTIAZEM HCL ER COATED BEADS 240 MG PO CP24: 240.0000 mg | ORAL_CAPSULE | Freq: Every day | ORAL | Status: DC

## 2023-09-26 MED ORDER — NICOTINE 14 MG/24HR TD PT24
14.0000 mg | MEDICATED_PATCH | Freq: Every day | TRANSDERMAL | Status: DC
  Administered 2023-09-27 – 2023-09-29 (×3): 14 mg via TRANSDERMAL
  Filled 2023-09-26 (×3): qty 1

## 2023-09-26 MED ORDER — IOHEXOL 350 MG/ML SOLN
75.0000 mL | Freq: Once | INTRAVENOUS | Status: AC | PRN
  Administered 2023-09-26: 75 mL via INTRAVENOUS

## 2023-09-26 MED ORDER — SODIUM CHLORIDE 0.9 % IV SOLN: INTRAVENOUS | Status: DC

## 2023-09-26 MED ORDER — METOCLOPRAMIDE HCL 5 MG/ML IJ SOLN
10.0000 mg | Freq: Once | INTRAMUSCULAR | Status: AC
  Administered 2023-09-26: 10 mg via INTRAVENOUS
  Filled 2023-09-26: qty 2

## 2023-09-26 MED ORDER — SODIUM CHLORIDE 0.9 % IV BOLUS
1000.0000 mL | Freq: Once | INTRAVENOUS | Status: AC
  Administered 2023-09-26: 1000 mL via INTRAVENOUS

## 2023-09-26 MED ORDER — ESCITALOPRAM OXALATE 10 MG PO TABS: 20.0000 mg | ORAL_TABLET | Freq: Every day | ORAL | Status: DC

## 2023-09-26 MED ORDER — STROKE: EARLY STAGES OF RECOVERY BOOK: Freq: Once | Status: AC

## 2023-09-26 MED ORDER — ENOXAPARIN SODIUM 40 MG/0.4ML IJ SOSY
40.0000 mg | PREFILLED_SYRINGE | INTRAMUSCULAR | Status: DC
  Administered 2023-09-27 – 2023-09-29 (×3): 40 mg via SUBCUTANEOUS
  Filled 2023-09-26 (×3): qty 0.4

## 2023-09-26 NOTE — Code Documentation (Signed)
 Stroke Response Nurse Documentation Code Documentation  Travis [name] is a 58 y.o. male arriving to [facility]  via [facility] on 09/26/2023 with past medical hx of aneurysm, . On No antithrombotic. Code stroke was activated by ED.   Patient from home where he was LKW at 1400 and now complaining of slurred speech, facial droop, right sided weakness, headache dizziness. Per EMS, patient was last known well at 1400 when he had began having symptoms of slurred speech, right sided weakness, headache - described as clapping and worst headache of his life, and facial droop.  Stroke team at the bedside on patient arrival. Labs drawn and patient cleared for CT by EDP. Patient to CT with team. NIHSS 9, see documentation for details and code stroke times. Patient with right facial droop, right arm weakness, bilateral leg weakness, right decreased sensation, and dysarthria  on exam. The following imaging was completed:  CT Head and CTA. Patient is not a candidate for IV Thrombolytic due to recently diagnosed intracranial aneurysm per MD. Patient is not not a candidate for IR due to no LVO noted on MD.   Care Plan: VS/NIHSS q2hr x12hr, then q4hr; BP Goal <220/120.   Bedside handoff with ED Paramedic Bruno.    Annabella DELENA Bame  Stroke Response RN

## 2023-09-26 NOTE — Consult Note (Incomplete)
 NEUROLOGY CONSULT NOTE   Date of service: September 26, 2023 Patient Name: Travis [name] MRN:  [id number] DOB:  1966-02-08 Chief Complaint: Acute onset of right arm and leg weakness with 10/10 right sided headache Requesting Provider: Patt Alm Macho, MD  History of Present Illness  Travis [name] is a 58 y.o. male with hx of ***  Recently evaluated at [facility] one week ago for similar symptoms of right sided headache and right sided weakness, but per patient the symptoms were worse last time.    LKW: 1400 Modified rankin score: {Modified Rankin Scale:21264} IV Thrombolysis:  No: Stated history of recently diagnosed intracranial aneurysm EVT: ***Yes, *** No (reason)   NIHSS components Score: Comment  1a Level of Conscious 0[x]  1[]  2[]  3[]      1b LOC Questions 0[x]  1[]  2[]       1c LOC Commands 0[x]  1[]  2[]       2 Best Gaze 0[]  1[]  2[]       3 Visual 0[]  1[]  2[]  3[]      4 Facial Palsy 0[]  1[]  2[]  3[]      5a Motor Arm - left 0[]  1[]  2[]  3[]  4[]  UN[]    5b Motor Arm - Right 0[]  1[]  2[]  3[]  4[]  UN[]    6a Motor Leg - Left 0[]  1[]  2[]  3[]  4[]  UN[]    6b Motor Leg - Right 0[]  1[]  2[]  3[]  4[]  UN[]    7 Limb Ataxia 0[]  1[]  2[]  UN[]      8 Sensory 0[]  1[]  2[]  UN[]      9 Best Language 0[]  1[]  2[]  3[]      10 Dysarthria 0[]  1[]  2[]  UN[]      11 Extinct. and Inattention 0[]  1[]  2[]       TOTAL:       ROS  ***Comprehensive ROS performed and pertinent positives documented in HPI  ***Unable to ascertain due to ***  Past History   Past Medical History:  Diagnosis Date   Anxiety    Panic attacks   Chest pain    Hospital, March, 2014, negative enzymes, patient refused in-hospital  stress test, patient canceled outpatient stress test   Constipation    Degenerative disk disease    Degenerative disk disease    Depression    GERD (gastroesophageal reflux disease)    Hypertension    Incontinence of urine    Neuromuscular disorder (HCC)    Obesity    Polysubstance abuse (HCC)    PTSD  (post-traumatic stress disorder)    Pulmonary emboli (HCC)    Seizures (HCC)    Shortness of breath dyspnea    with exertion   Spinal stenosis    Spinal stenosis    Suicide attempt (HCC)    Tachycardia - pulse    Tobacco abuse     Past Surgical History:  Procedure Laterality Date   COLONOSCOPY N/A 08/24/2014   Procedure: COLONOSCOPY;  Surgeon: Jerrell Sol, MD;  Location: [facility] ENDOSCOPY;  Service: Endoscopy;  Laterality: N/A;   ESOPHAGOGASTRODUODENOSCOPY (EGD) WITH PROPOFOL  N/A 08/24/2014   Procedure: ESOPHAGOGASTRODUODENOSCOPY (EGD) WITH PROPOFOL ;  Surgeon: Jerrell Sol, MD;  Location: [facility] ENDOSCOPY;  Service: Endoscopy;  Laterality: N/A;   WISDOM TOOTH EXTRACTION      Family History: Family History  Problem Relation Age of Onset   Stroke Mother    Hypertension Mother    Hyperlipidemia Mother    Stroke Father    Hypertension Father    Dementia Father    Diabetes Father    Heart disease Father    Hyperlipidemia Father  Cancer Sister        brain   Diabetes Sister    Heart disease Sister    Hyperlipidemia Sister    Hypertension Sister    Stroke Sister    Heart disease Brother    Hyperlipidemia Brother     Social History  reports that he has been smoking cigarettes. He started smoking about 6 years ago. He has never used smokeless tobacco. He reports that he does not currently use drugs after having used the following drugs: Amphetamines, Marijuana, and Benzodiazepines. He reports that he does not drink alcohol.  Allergies  Allergen Reactions   Vaccinium Angustifolium Anaphylaxis and Hives   Blueberry Flavoring Agent (Non-Screening) Hives   Penicillins Hives, Itching and Other (See Comments)    Hallucinations. Has patient had a PCN reaction causing immediate rash, facial/tongue/throat swelling, SOB or lightheadedness with hypotension:YES Has patient had a PCN reaction causing severe rash involving mucus membranes or skin necrosis: NO Has patient had a PCN  reaction that required hospitalization: yes Has patient had a PCN reaction occurring within the last 10 years: NO If all of the above answers are NO, then may proceed with Cephalosporin use. * Zosyn  x 1 03/24/21. No rxn noted.   Robaxin  [Methocarbamol ] Palpitations   Toradol  [Ketorolac  Tromethamine ] Hives and Other (See Comments)    Headache, pt states that he is not allergic    Medications  No current facility-administered medications for this encounter.  Current Outpatient Medications:    amitriptyline  (ELAVIL ) 25 MG tablet, Take 1 tablet (25 mg total) by mouth at bedtime., Disp: 30 tablet, Rfl: 0   cloNIDine  (CATAPRES ) 0.1 MG tablet, Take 1 tablet (0.1 mg total) by mouth 2 (two) times daily., Disp: 60 tablet, Rfl: 11   cyclobenzaprine  (FLEXERIL ) 10 MG tablet, Take 1 tablet (10 mg total) by mouth 3 (three) times daily as needed for muscle spasms., Disp: 30 tablet, Rfl: 0   diltiazem  (CARDIZEM  CD) 240 MG 24 hr capsule, Take 1 capsule (240 mg total) by mouth daily., Disp: 30 capsule, Rfl: 0   escitalopram  (LEXAPRO ) 20 MG tablet, Take 1 tablet (20 mg total) by mouth daily after breakfast., Disp: 30 tablet, Rfl: 0   famotidine  (PEPCID ) 20 MG tablet, Take 1 tablet (20 mg total) by mouth daily as needed for heartburn or indigestion., Disp: 30 tablet, Rfl: 0   gabapentin  (NEURONTIN ) 300 MG capsule, Take 3 capsules (900 mg total) by mouth 3 (three) times daily., Disp: 270 capsule, Rfl: 0   metFORMIN  (GLUCOPHAGE -XR) 500 MG 24 hr tablet, Take 1 tablet (500 mg total) by mouth daily with breakfast., Disp: 30 tablet, Rfl: 0   nicotine  (NICODERM CQ  - DOSED IN MG/24 HOURS) 14 mg/24hr patch, Place 1 patch (14 mg total) onto the skin daily., Disp: 28 patch, Rfl: 0   QUEtiapine  (SEROQUEL ) 300 MG tablet, Take 1 tablet (300 mg total) by mouth at bedtime., Disp: 30 tablet, Rfl: 0   risperiDONE  (RISPERDAL ) 0.5 MG tablet, Take 1 tablet (0.5 mg total) by mouth 2 (two) times daily at 8 am and 4 pm., Disp: 60  tablet, Rfl: 0   traMADol  (ULTRAM ) 50 MG tablet, Take 1 tablet (50 mg total) by mouth every 12 (twelve) hours as needed for severe pain (pain score 7-10)., Disp: 30 tablet, Rfl: 0  Vitals   Vitals:   09/26/23 1700  Weight: 119.2 kg    Body mass index is 35.64 kg/m.   Physical Exam   Constitutional: Appears well-developed and well-nourished. *** Psych: Affect  appropriate to situation. *** Eyes: No scleral injection. *** HENT: No OP obstruction. *** Head: Normocephalic. *** Cardiovascular: Normal rate and regular rhythm. *** Respiratory: Effort normal, non-labored breathing. *** GI: Soft.  No distension. There is no tenderness. *** Skin: WDI. ***  Neurologic Examination   ***  Labs/Imaging/Neurodiagnostic studies   CBC: No results for input(s): WBC, NEUTROABS, HGB, HCT, MCV, PLT in the last 168 hours. Basic Metabolic Panel:  Lab Results  Component Value Date   NA 137 01/20/2023   K 3.0 (L) 01/20/2023   CO2 22 01/20/2023   GLUCOSE 167 (H) 01/20/2023   BUN 16 01/20/2023   CREATININE 1.14 01/20/2023   CALCIUM  8.9 01/20/2023   GFRNONAA >60 01/20/2023   GFRAA >60 01/03/2019   Lipid Panel:  Lab Results  Component Value Date   LDLCALC 75 02/03/2023   HgbA1c:  Lab Results  Component Value Date   HGBA1C 5.8 (H) 01/31/2023   Urine Drug Screen:     Component Value Date/Time   LABOPIA NONE DETECTED 01/20/2023 1400   COCAINSCRNUR NONE DETECTED 01/20/2023 1400   COCAINSCRNUR NEG 07/27/2013 1231   LABBENZ POSITIVE (A) 01/20/2023 1400   LABBENZ PPS 07/27/2013 1231   AMPHETMU POSITIVE (A) 01/20/2023 1400   THCU POSITIVE (A) 01/20/2023 1400   LABBARB NONE DETECTED 01/20/2023 1400    Alcohol Level     Component Value Date/Time   ETH <10 01/20/2023 1147   INR  Lab Results  Component Value Date   INR 1.1 03/27/2021   APTT  Lab Results  Component Value Date   APTT 31 03/27/2021     ASSESSMENT   Travis [name] is a 58 y.o. male  ***  RECOMMENDATIONS  *** ______________________________________________________________________    Bonney SHARK, Uel Davidow, MD [facility]

## 2023-09-26 NOTE — ED Provider Notes (Signed)
 [place] EMERGENCY DEPARTMENT AT [facility] Provider Note   CSN: [id number] Arrival date & time: 09/26/23  1740  An emergency department physician performed an initial assessment on this suspected stroke patient at 1744.  Patient presents with: Code Stroke   Travis [name] is a 58 y.o. male history of hypertension, hyperlipidemia, anxiety and depression, recent stroke given TNK here presenting with headache and slurred speech.  Patient woke up from a nap around 2 PM.  Patient then had slurred speech and right-sided weakness.  Code stroke was activated by EMS.  Patient was recently admitted to [facility] with similar symptoms and given TN K.  His CTA showed fusiform aneurysm dilation.  Patient was not a candidate of thrombectomy and just got to the hospital on July 23.  Of note, patient also has chronic pain as well.   The history is provided by the patient.       Prior to Admission medications   Medication Sig Start Date End Date Taking? Authorizing Provider  amitriptyline  (ELAVIL ) 25 MG tablet Take 1 tablet (25 mg total) by mouth at bedtime. 02/11/23   Victoria Ruts, MD  cloNIDine  (CATAPRES ) 0.1 MG tablet Take 1 tablet (0.1 mg total) by mouth 2 (two) times daily. 02/11/23   Victoria Ruts, MD  cyclobenzaprine  (FLEXERIL ) 10 MG tablet Take 1 tablet (10 mg total) by mouth 3 (three) times daily as needed for muscle spasms. 02/11/23   Victoria Ruts, MD  diltiazem  (CARDIZEM  CD) 240 MG 24 hr capsule Take 1 capsule (240 mg total) by mouth daily. 02/12/23   Victoria Ruts, MD  escitalopram  (LEXAPRO ) 20 MG tablet Take 1 tablet (20 mg total) by mouth daily after breakfast. 02/12/23   Victoria Ruts, MD  famotidine  (PEPCID ) 20 MG tablet Take 1 tablet (20 mg total) by mouth daily as needed for heartburn or indigestion. 02/11/23   Victoria Ruts, MD  gabapentin  (NEURONTIN ) 300 MG capsule Take 3 capsules (900 mg total) by mouth 3 (three) times daily. 02/11/23   Victoria Ruts, MD  metFORMIN  (GLUCOPHAGE -XR) 500 MG 24 hr tablet Take 1 tablet (500 mg total) by mouth daily with breakfast. 02/12/23   Victoria Ruts, MD  nicotine  (NICODERM CQ  - DOSED IN MG/24 HOURS) 14 mg/24hr patch Place 1 patch (14 mg total) onto the skin daily. 02/12/23   Victoria Ruts, MD  QUEtiapine  (SEROQUEL ) 300 MG tablet Take 1 tablet (300 mg total) by mouth at bedtime. 02/11/23   Victoria Ruts, MD  risperiDONE  (RISPERDAL ) 0.5 MG tablet Take 1 tablet (0.5 mg total) by mouth 2 (two) times daily at 8 am and 4 pm. 02/11/23   Victoria Ruts, MD  traMADol  (ULTRAM ) 50 MG tablet Take 1 tablet (50 mg total) by mouth every 12 (twelve) hours as needed for severe pain (pain score 7-10). 02/11/23   Victoria Ruts, MD    Allergies: Vaccinium angustifolium, Blueberry flavoring agent (non-screening), Penicillins, Robaxin  [methocarbamol ], and Toradol  [ketorolac  tromethamine ]    Review of Systems  Neurological:  Positive for speech difficulty and headaches.  All other systems reviewed and are negative.   Updated Vital Signs BP (!) 131/91   Pulse 71   Temp 98.2 F (36.8 C) (Oral)   Resp 16   Ht 6' (1.829 m)   Wt 111.1 kg   SpO2 99%   BMI 33.23 kg/m   Physical Exam Vitals and nursing note reviewed.  Constitutional:      Comments: Slightly uncomfortable and patient does have some expressive aphasia  HENT:  Head: Normocephalic.     Nose: Nose normal.     Mouth/Throat:     Mouth: Mucous membranes are moist.  Eyes:     Extraocular Movements: Extraocular movements intact.     Pupils: Pupils are equal, round, and reactive to light.  Cardiovascular:     Rate and Rhythm: Normal rate and regular rhythm.     Pulses: Normal pulses.     Heart sounds: Normal heart sounds.  Pulmonary:     Effort: Pulmonary effort is normal.     Breath sounds: Normal breath sounds.  Abdominal:     General: Abdomen is flat.     Palpations: Abdomen is soft.  Musculoskeletal:        General:  Normal range of motion.     Cervical back: Normal range of motion and neck supple.  Skin:    General: Skin is warm.     Capillary Refill: Capillary refill takes less than 2 seconds.  Neurological:     Mental Status: He is alert.     Comments: Some expressive aphasia.  Patient's strength is 4 out of 5 of the right side and 5 out of 5 in left side  Psychiatric:        Mood and Affect: Mood normal.        Behavior: Behavior normal.     (all labs ordered are listed, but only abnormal results are displayed) Labs Reviewed  COMPREHENSIVE METABOLIC PANEL WITH GFR - Abnormal; Notable for the following components:      Result Value   Glucose, Bld 105 (*)    Calcium  8.5 (*)    Albumin 3.3 (*)    AST 56 (*)    All other components within normal limits  CBG MONITORING, ED - Abnormal; Notable for the following components:   Glucose-Capillary 111 (*)    All other components within normal limits  CBC WITH DIFFERENTIAL/PLATELET  LIPID PANEL  HEMOGLOBIN A1C  RAPID URINE DRUG SCREEN, HOSP PERFORMED    EKG: None  Radiology: CT ANGIO HEAD NECK W WO CM (CODE STROKE) Result Date: 09/26/2023 CLINICAL DATA:  Acute neurological deficit. EXAM: CT ANGIOGRAPHY HEAD AND NECK WITH CONTRAST TECHNIQUE: Multidetector CT imaging of the head and neck was performed using the standard protocol during bolus administration of intravenous contrast. Multiplanar CT image reconstructions and MIPs were obtained to evaluate the vascular anatomy. Carotid stenosis measurements (when applicable) are obtained utilizing NASCET criteria, using the distal internal carotid diameter as the denominator. RADIATION DOSE REDUCTION: This exam was performed according to the departmental dose-optimization program which includes automated exposure control, adjustment of the mA and/or kV according to patient size and/or use of iterative reconstruction technique. CONTRAST:  75mL OMNIPAQUE  IOHEXOL  350 MG/ML SOLN COMPARISON:  CT of the head  dated September 26, 2023. FINDINGS: CTA NECK FINDINGS Aortic arch: Normal in caliber. Three vessel origin of the great arteries. The brachiocephalic artery is normal in caliber. Right carotid system: The common carotid and internal carotid arteries are normal in caliber and unremarkable. Left carotid system: There is mild calcific atheromatous disease within the common carotid artery with no flow-limiting stenosis. The internal carotid artery is normal in caliber and unremarkable. Vertebral arteries: The vertebral arteries are codominant and normal in caliber throughout the respective courses. Skeleton: Mild degenerative changes throughout the cervical spine. Other neck: Negative. Upper chest: The visualized lung fields are clear. Review of the MIP images confirms the above findings CTA HEAD FINDINGS Anterior circulation: The cavernous and supraclinoid segments of  the internal carotid arteries demonstrate mild stenosis, but no flow-limiting stenosis. There is venous contamination within the right cavernous sinus. There is irregular beading of the M1 and A1 branches of the left middle and anterior cerebral arteries, but there is no flow-limiting stenosis. There is also mild beading of the right M1 segment, also without flow-limiting stenosis. The M2 branches of both middle cerebral arteries demonstrate mild to moderate stenosis, but no large vessel occlusion. The anterior cerebral arteries also demonstrate mild to moderate atheromatous disease without large vessel occlusion. 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 The mid to distal basal artery is normal in caliber. The posterior cerebral arteries and cerebellar arteries are normal in caliber. There is no flow-limiting stenosis. Venous sinuses: Patent and unremarkable. Anatomic variants: None. Review of the MIP images confirms the above findings IMPRESSION: 1. 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 There is also involvement of the M2 branches bilaterally. There is no large vessel occlusion evident. 2. 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 3. Mild calcific atheromatous disease within the left carotid artery without flow-limiting stenosis. Estimated stenosis is less than 10%. These results were called by telephone at the time of interpretation on 09/26/2023 at 6:10 pm to provider ERIC [facility] , who verbally acknowledged these results. Electronically Signed   By: Evalene Coho M.D.   On: 09/26/2023 18:23   CT HEAD CODE STROKE WO CONTRAST Result Date: 09/26/2023 CLINICAL DATA:  Code stroke. Acute neurological deficit with left facial droop and right weakness. EXAM: CT HEAD WITHOUT CONTRAST TECHNIQUE: Contiguous axial images were obtained from the base of the skull through the vertex without intravenous contrast. RADIATION DOSE REDUCTION: This exam was performed according to the departmental dose-optimization program which includes automated exposure control, adjustment of the mA and/or kV according to patient size and/or use of iterative reconstruction technique. COMPARISON:  CT of the head dated September 01, 2015. FINDINGS: Brain: Mild generalized cerebral volume loss. Mild periventricular white matter disease. No evidence of hemorrhage, mass, acute cortical infarct or hydrocephalus. Vascular: Mild calcific atheromatous disease. Skull: Intact and unremarkable. Sinuses/Orbits: Clear paranasal sinuses and mastoid air cells. Normal orbits. IMPRESSION: 1. Age-related atrophy and mild cerebral white matter disease. 2. ASPECTS is 10. 3. These results were communicated to Dr. Lindzen at 5:56 pm on 09/26/2023 by text page via the [facility] messaging system. Electronically Signed   By: Evalene Coho M.D.   On: 09/26/2023 17:59     Procedures   Medications Ordered in the ED   stroke: early  stages of recovery book (has no administration in time range)  iohexol  (OMNIPAQUE ) 350 MG/ML injection 75 mL (75 mLs Intravenous Contrast Given 09/26/23 1806)  metoCLOPramide  (REGLAN ) injection 10 mg (10 mg Intravenous Given 09/26/23 1835)  diphenhydrAMINE (BENADRYL) injection 25 mg (25 mg Intravenous Given 09/26/23 1836)  sodium chloride  0.9 % bolus 1,000 mL (0 mLs Intravenous Stopped 09/26/23 1948)                                    Medical Decision Making Travis [name] is a 58 y.o. male here with headache and right-sided weakness and slurred speech.  Patient recently had a stroke and given TNK but also has a aneurysm.  Consider stroke versus bleed versus  ruptured aneurysm.  Plan to get CBC and CMP and CTA head and neck.  Will give migraine cocktail as well  8:03 PM I reviewed patient's labs and they were unremarkable.  CTA showed no occlusion but he does have known aneurysm with no rupture.  Per Dr. Merrianne from neurology, they recommend admission for stroke workup  Problems Addressed: Slurred speech: acute illness or injury  Amount and/or Complexity of Data Reviewed Labs: ordered. Decision-making details documented in ED Course. ECG/medicine tests: ordered and independent interpretation performed. Decision-making details documented in ED Course.  Risk Prescription drug management.     Final diagnoses:  Slurred speech    ED Discharge Orders     None          Patt Alm Macho, MD 09/26/23 2004

## 2023-09-26 NOTE — ED Triage Notes (Signed)
 PT BIB [facility] from home, LKW 2pm today, c/o of a bad sudden headache on the right side described as a clapping sensation, then he noticed slurred speech and R sided weakness. Delayed EMS call due to unsure what was going on. Recently at [facility] for TIA with aneurysm, but denies any deficits from event. Pt also c/o lower back, L4-L5 pain, hx of degenerative disc disease.  EMS vitals:  BP: 165/100 HR: 75 NS CBG:110 NIH: 5

## 2023-09-26 NOTE — Consult Note (Addendum)
 NEUROLOGY CONSULT NOTE   Date of service: September 26, 2023 Patient Name: Travis [name] MRN:  [id number] DOB:  1966-02-05 Chief Complaint: Right-sided weakness, dysarthria and right facial droop with 10/10 right-sided headache Requesting Provider: Patt Alm Macho, MD  History of Present Illness  Travis [name] is a 58 y.o. male with hx of hypertension, hyperlipidemia, substance abuse, recent stroke aborted by TNK, anxiety, basilar artery aneurysm, GERD, depression and PNES who was admitted with acute onset right-sided weakness, dysarthria and right facial droop.  Patient states he was recently seen at [facility] where he was treated for stroke with TNK with no residual deficit and was diagnosed with a fusiform intracranial aneurysm.  Upon review of records, patient had presented there with slurred speech, ataxia and confusion and had been given TNK then.  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 On exam in the ED this evening, he was noted to have weakness and drift of the right upper extremity and bilateral lower extremities, although he was able to gesture normally with his right arm.  He reports that he has had recent heart palpitations and diarrhea but no other symptoms.  He states that he has not slept in 3 days as he has been in the hospital with his sister who recently had a stroke.  LKW: 1400 Modified rankin score: 2-Slight disability-UNABLE to perform all activities but does not need assistance IV Thrombolysis: No, basilar artery aneurysm EVT: No, no LVO  NIHSS components Score: Comment  1a Level of Conscious 0[x]  1[]  2[]  3[]      1b LOC Questions 0[x]  1[]  2[]       1c LOC Commands 0[x]  1[]  2[]       2 Best Gaze 0[x]  1[]  2[]       3 Visual 0[x]  1[]  2[]  3[]      4 Facial Palsy 0[]  1[x]  2[]  3[]  May be effort dependent    5a Motor Arm - left 0[x]  1[]  2[]  3[]  4[]  UN[]    5b Motor Arm - Right 0[]  1[]  2[x]  3[]  4[]  UN[]    6a Motor Leg - Left 0[]  1[]  2[x]   3[]  4[]  UN[]    6b Motor Leg - Right 0[]  1[]  2[x]  3[]  4[]  UN[]    7 Limb Ataxia 0[]  1[]  2[]  UN[]      8 Sensory 0[]  1[x]  2[]  UN[]      9 Best Language 0[x]  1[]  2[]  3[]      10 Dysarthria 0[]  1[x]  2[]  UN[]      11 Extinct. and Inattention 0[x]  1[]  2[]       TOTAL:  9       ROS  Comprehensive ROS performed and pertinent positives documented in HPI   Past History   Past Medical History:  Diagnosis Date   Anxiety    Panic attacks   Chest pain    Hospital, March, 2014, negative enzymes, patient refused in-hospital  stress test, patient canceled outpatient stress test   Constipation    Degenerative disk disease    Degenerative disk disease    Depression    GERD (gastroesophageal reflux disease)    Hypertension    Incontinence of urine    Neuromuscular disorder (HCC)    Obesity    Polysubstance abuse (HCC)    PTSD (post-traumatic stress disorder)    Pulmonary emboli (HCC)    Seizures (HCC)    Shortness of breath dyspnea    with exertion   Spinal stenosis    Spinal stenosis    Suicide attempt (HCC)  Tachycardia - pulse    Tobacco abuse     Past Surgical History:  Procedure Laterality Date   COLONOSCOPY N/A 08/24/2014   Procedure: COLONOSCOPY;  Surgeon: Jerrell Sol, MD;  Location: [facility] ENDOSCOPY;  Service: Endoscopy;  Laterality: N/A;   ESOPHAGOGASTRODUODENOSCOPY (EGD) WITH PROPOFOL  N/A 08/24/2014   Procedure: ESOPHAGOGASTRODUODENOSCOPY (EGD) WITH PROPOFOL ;  Surgeon: Jerrell Sol, MD;  Location[facility] ENDOSCOPY;  Service: Endoscopy;  Laterality: N/A;   WISDOM TOOTH EXTRACTION      Family History: Family History  Problem Relation Age of Onset   Stroke Mother    Hypertension Mother    Hyperlipidemia Mother    Stroke Father    Hypertension Father    Dementia Father    Diabetes Father    Heart disease Father    Hyperlipidemia Father    Cancer Sister        brain   Diabetes Sister    Heart disease Sister    Hyperlipidemia Sister    Hypertension Sister    Stroke  Sister    Heart disease Brother    Hyperlipidemia Brother     Social History  reports that he has been smoking cigarettes. He started smoking about 6 years ago. He has never used smokeless tobacco. He reports that he does not currently use drugs after having used the following drugs: Amphetamines, Marijuana, and Benzodiazepines. He reports that he does not drink alcohol.  Allergies  Allergen Reactions   Vaccinium Angustifolium Anaphylaxis and Hives   Blueberry Flavoring Agent (Non-Screening) Hives   Penicillins Hives, Itching and Other (See Comments)    Hallucinations. Has patient had a PCN reaction causing immediate rash, facial/tongue/throat swelling, SOB or lightheadedness with hypotension:YES Has patient had a PCN reaction causing severe rash involving mucus membranes or skin necrosis: NO Has patient had a PCN reaction that required hospitalization: yes Has patient had a PCN reaction occurring within the last 10 years: NO If all of the above answers are NO, then may proceed with Cephalosporin use. * Zosyn  x 1 03/24/21. No rxn noted.   Robaxin  [Methocarbamol ] Palpitations   Toradol  [Ketorolac  Tromethamine ] Hives and Other (See Comments)    Headache, pt states that he is not allergic    Medications   Current Facility-Administered Medications:    [START ON 09/27/2023]  stroke: early stages of recovery book, , Does not apply, Once, de Clint Kill, Cortney E, NP  Current Outpatient Medications:    amitriptyline  (ELAVIL ) 25 MG tablet, Take 1 tablet (25 mg total) by mouth at bedtime., Disp: 30 tablet, Rfl: 0   cloNIDine  (CATAPRES ) 0.1 MG tablet, Take 1 tablet (0.1 mg total) by mouth 2 (two) times daily., Disp: 60 tablet, Rfl: 11   cyclobenzaprine  (FLEXERIL ) 10 MG tablet, Take 1 tablet (10 mg total) by mouth 3 (three) times daily as needed for muscle spasms., Disp: 30 tablet, Rfl: 0   diltiazem  (CARDIZEM  CD) 240 MG 24 hr capsule, Take 1 capsule (240 mg total) by mouth daily., Disp: 30  capsule, Rfl: 0   escitalopram  (LEXAPRO ) 20 MG tablet, Take 1 tablet (20 mg total) by mouth daily after breakfast., Disp: 30 tablet, Rfl: 0   famotidine  (PEPCID ) 20 MG tablet, Take 1 tablet (20 mg total) by mouth daily as needed for heartburn or indigestion., Disp: 30 tablet, Rfl: 0   gabapentin  (NEURONTIN ) 300 MG capsule, Take 3 capsules (900 mg total) by mouth 3 (three) times daily., Disp: 270 capsule, Rfl: 0   metFORMIN  (GLUCOPHAGE -XR) 500 MG 24 hr tablet, Take 1  tablet (500 mg total) by mouth daily with breakfast., Disp: 30 tablet, Rfl: 0   nicotine  (NICODERM CQ  - DOSED IN MG/24 HOURS) 14 mg/24hr patch, Place 1 patch (14 mg total) onto the skin daily., Disp: 28 patch, Rfl: 0   QUEtiapine  (SEROQUEL ) 300 MG tablet, Take 1 tablet (300 mg total) by mouth at bedtime., Disp: 30 tablet, Rfl: 0   risperiDONE  (RISPERDAL ) 0.5 MG tablet, Take 1 tablet (0.5 mg total) by mouth 2 (two) times daily at 8 am and 4 pm., Disp: 60 tablet, Rfl: 0   traMADol  (ULTRAM ) 50 MG tablet, Take 1 tablet (50 mg total) by mouth every 12 (twelve) hours as needed for severe pain (pain score 7-10)., Disp: 30 tablet, Rfl: 0  Vitals   Vitals:   09/26/23 1700 09/26/23 1745 09/26/23 1754 09/26/23 1800  BP:  139/65 (!) 133/92 (!) 130/98  Pulse:  70 71 69  SpO2:  100% 98% 100%  Weight: 119.2 kg       Body mass index is 35.64 kg/m.   Physical Exam   Constitutional: Appears well-developed and well-nourished.  Psych: Affect blunted Eyes: No scleral injection.  HENT: No OP obstruction.  Head: Normocephalic.  Cardiovascular: Normal rate and regular rhythm.  Respiratory: Effort normal, non-labored breathing.  Skin: WDI.   Neurologic Examination    NEURO:  Mental Status: AA&Ox3, able to give history of present illness Speech/Language: speech is with mild dysarthria but without aphasia.  Naming, repetition, fluency, and comprehension intact.  Cranial Nerves:  II: PERRL. Visual fields full.  III, IV, VI: EOMI. Eyelids  elevate symmetrically.  V: Sensation is intact to light touch and symmetrical to face.  VII: Questionable right facial droop VIII: hearing intact to voice. IX, X: Vocalizations intact. He is mildly dysarthric XII: tongue is midline without fasciculations. Motor: Moves bilateral extremities with antigravity strength, some drift noted in right upper extremity, but he is able to gesture with this arm normally when talking.  Symmetrical weakness and drift in bilateral lower extremities Tone is normal and bulk is normal Sensation- Intact to light touch bilaterally but diminished on the right. Extinction absent to light touch to DSS.   Coordination: FTN intact on the left, unable to perform on the right Gait- deferred    Labs/Imaging/Neurodiagnostic studies   CBC: No results for input(s): WBC, NEUTROABS, HGB, HCT, MCV, PLT in the last 168 hours. Basic Metabolic Panel:  Lab Results  Component Value Date   NA 137 01/20/2023   K 3.0 (L) 01/20/2023   CO2 22 01/20/2023   GLUCOSE 167 (H) 01/20/2023   BUN 16 01/20/2023   CREATININE 1.14 01/20/2023   CALCIUM  8.9 01/20/2023   GFRNONAA >60 01/20/2023   GFRAA >60 01/03/2019   Lipid Panel:  Lab Results  Component Value Date   LDLCALC 75 02/03/2023   HgbA1c:  Lab Results  Component Value Date   HGBA1C 5.8 (H) 01/31/2023   CT Head without contrast (Personally reviewed): No acute abnormality. Atrophy and chronic small vessel ischemic disease  CT angio Head and Neck with contrast: 1. There is moderate atheromatous disease within the anterior and middle cerebral arteries, primarily involving the left M1 and A1 segments. There is also involvement of the M2 branches bilaterally. There is no large vessel occlusion evident. 2. 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 3. Mild calcific atheromatous disease within the left carotid artery without flow-limiting stenosis. Estimated stenosis is less than 10%.  MRI  Brain: Pending   ASSESSMENT  Lynwood  Olds is a 58 y.o. male with hx of hypertension, hyperlipidemia, substance abuse, recent stroke aborted by TNK, anxiety, basilar artery fusiform aneurysm, GERD, depression and PNES who was admitted with acute onset right-sided weakness, dysarthria and right facial droop.  On 718, he was seen at [facility] with slurred speech ataxia and confusion and was given TNK to treat potential stroke.  He was deemed to have a stroke aborted with tenecteplase and was later found to have fusiform aneurysmal dilation of the basilar artery.  Patient states that he has been having heart palpitations, has had a recent gastrointestinal illness and that he has not slept for 3 days as he was visiting his sister in the hospital.  He presents to day with acute onset of right-sided weakness, dysarthria and right facial droop with 10/10 right-sided headache. - On exam, he is noted to have weakness and drift in right upper extremity and bilateral lower extremities, although he is able to gesture normally with his right upper extremity while speaking.   - CT head reveals no acute abnormality  - CTA reveals no LVO.  Fusiform basilar artery aneurysm is noted.  - Patient was not a TNK candidate due to basilar artery aneurysm.   - He will need brain MRI and admission for full stroke workup.  RECOMMENDATIONS  Stroke/TIA Workup: - Admit for stroke workup - Permissive HTN x48 hrs from sx onset or until stroke ruled out by MRI goal BP <220/110. PRN labetalol or hydralazine  if BP above these parameters. Avoid oral antihypertensives. - MRI brain wo contrast - TTE w/ bubble, apparently not done during recent admission to outside hospital - Check A1c and LDL + add statin per guidelines - Has an NSAID allergy - Plavix  75 mg po every day  - Further decisions regarding antiplt/anticoag to be determined after MRI, TTE and cardiac telemetry - q4 hr neuro checks - STAT head CT for any change in neuro  exam - Tele - PT/OT/SLP - Stroke education - Amb referral to neurology upon discharge   - Stroke Team to follow ______________________________________________________________________  Patient seen by NP with MD Signed, Cortney E Everitt Clint Kill, NP [facility]   I have seen and examined the patient. I have formulated the assessment and recommendations. 58 y.o. male with hx of hypertension, hyperlipidemia, substance abuse, recent stroke aborted by TNK, anxiety, basilar artery aneurysm, GERD, depression and PNES who presents with acute onset of right-sided weakness, dysarthria and right facial droop with 10/10 right-sided headache. CT head shows no acute abnormality. CTA with no LVO; fusiform basilar artery aneurysm is noted. Exam reveals patchy deficits with some incongruencies. Not a TNK candidate due to aneurysm. Recommendations as above.  Electronically signed: Dr. Angely Dietz

## 2023-09-26 NOTE — H&P (Signed)
 History and Physical    Patient: Travis [name] FMW:[id number] DOB: 17-May-1965 DOA: 09/26/2023 DOS: the patient was seen and examined on 09/26/2023 PCP: Scarlett [name], NP  Patient coming from: Home  Chief Complaint:  Chief Complaint  Patient presents with   Code Stroke   HPI: Travis [name] is a 58 y.o. male with medical history significant of anxiety disorder, degenerative disc disease, PTSD, history of pulmonary embolism, history of seizure disorder, essential hypertension, hyperlipidemia, GERD, morbid obesity, who was recently admitted to [facility] in [place] with a code stroke.  Patient received TNK at the time.  Subsequent imaging showed intracerebral aneurysm and was discharged home only last week.  Patient presented today with acute onset of right arm and leg weakness with headache 10 out of 10 on the right side.  This is different from the symptoms he had last week at [facility] where he had slurred speech with ataxia and confusion.  Patient is still symptomatic with some weakness and drift of the right upper extremity and bilateral lower extremities.  A code stroke was initiated.  Neurology has seen the patient.  At this point recommendation is to admit the patient for CVA workup. Today however initial evaluation with head CT without contrast showed no acute findings.  CT angiogram of the head and neck showed fusiform aneurysm around the origin of the basilar artery  Review of Systems: As mentioned in the history of present illness. All other systems reviewed and are negative. Past Medical History:  Diagnosis Date   Anxiety    Panic attacks   Chest pain    Hospital, March, 2014, negative enzymes, patient refused in-hospital  stress test, patient canceled outpatient stress test   Constipation    Degenerative disk disease    Degenerative disk disease    Depression    GERD (gastroesophageal reflux disease)    Hypertension    Incontinence of urine     Neuromuscular disorder (HCC)    Obesity    Polysubstance abuse (HCC)    PTSD (post-traumatic stress disorder)    Pulmonary emboli (HCC)    Seizures (HCC)    Shortness of breath dyspnea    with exertion   Spinal stenosis    Spinal stenosis    Suicide attempt (HCC)    Tachycardia - pulse    Tobacco abuse    Past Surgical History:  Procedure Laterality Date   COLONOSCOPY N/A 08/24/2014   Procedure: COLONOSCOPY;  Surgeon: Jerrell Sol, MD;  Location: [facility] ENDOSCOPY;  Service: Endoscopy;  Laterality: N/A;   ESOPHAGOGASTRODUODENOSCOPY (EGD) WITH PROPOFOL  N/A 08/24/2014   Procedure: ESOPHAGOGASTRODUODENOSCOPY (EGD) WITH PROPOFOL ;  Surgeon: Jerrell Sol, MD;  Location: [facility] ENDOSCOPY;  Service: Endoscopy;  Laterality: N/A;   WISDOM TOOTH EXTRACTION     Social History:  reports that he has been smoking cigarettes. He started smoking about 6 years ago. He has never used smokeless tobacco. He reports that he does not currently use drugs after having used the following drugs: Amphetamines, Marijuana, and Benzodiazepines. He reports that he does not drink alcohol.  Allergies  Allergen Reactions   Vaccinium Angustifolium Anaphylaxis and Hives   Blueberry Flavoring Agent (Non-Screening) Hives   Penicillins Hives, Itching and Other (See Comments)    Hallucinations. Has patient had a PCN reaction causing immediate rash, facial/tongue/throat swelling, SOB or lightheadedness with hypotension:YES Has patient had a PCN reaction causing severe rash involving mucus membranes or skin necrosis: NO Has patient had a PCN reaction that required hospitalization: yes Has  patient had a PCN reaction occurring within the last 10 years: NO If all of the above answers are NO, then may proceed with Cephalosporin use. * Zosyn  x 1 03/24/21. No rxn noted.   Robaxin  [Methocarbamol ] Palpitations   Toradol  [Ketorolac  Tromethamine ] Hives and Other (See Comments)    Headache, pt states that he is not allergic     Family History  Problem Relation Age of Onset   Stroke Mother    Hypertension Mother    Hyperlipidemia Mother    Stroke Father    Hypertension Father    Dementia Father    Diabetes Father    Heart disease Father    Hyperlipidemia Father    Cancer Sister        brain   Diabetes Sister    Heart disease Sister    Hyperlipidemia Sister    Hypertension Sister    Stroke Sister    Heart disease Brother    Hyperlipidemia Brother     Prior to Admission medications   Medication Sig Start Date End Date Taking? Authorizing Provider  amitriptyline  (ELAVIL ) 25 MG tablet Take 1 tablet (25 mg total) by mouth at bedtime. 02/11/23   Victoria Ruts, MD  cloNIDine  (CATAPRES ) 0.1 MG tablet Take 1 tablet (0.1 mg total) by mouth 2 (two) times daily. 02/11/23   Victoria Ruts, MD  cyclobenzaprine  (FLEXERIL ) 10 MG tablet Take 1 tablet (10 mg total) by mouth 3 (three) times daily as needed for muscle spasms. 02/11/23   Victoria Ruts, MD  diltiazem  (CARDIZEM  CD) 240 MG 24 hr capsule Take 1 capsule (240 mg total) by mouth daily. 02/12/23   Victoria Ruts, MD  escitalopram  (LEXAPRO ) 20 MG tablet Take 1 tablet (20 mg total) by mouth daily after breakfast. 02/12/23   Victoria Ruts, MD  famotidine  (PEPCID ) 20 MG tablet Take 1 tablet (20 mg total) by mouth daily as needed for heartburn or indigestion. 02/11/23   Victoria Ruts, MD  gabapentin  (NEURONTIN ) 300 MG capsule Take 3 capsules (900 mg total) by mouth 3 (three) times daily. 02/11/23   Victoria Ruts, MD  metFORMIN  (GLUCOPHAGE -XR) 500 MG 24 hr tablet Take 1 tablet (500 mg total) by mouth daily with breakfast. 02/12/23   Victoria Ruts, MD  nicotine  (NICODERM CQ  - DOSED IN MG/24 HOURS) 14 mg/24hr patch Place 1 patch (14 mg total) onto the skin daily. 02/12/23   Victoria Ruts, MD  QUEtiapine  (SEROQUEL ) 300 MG tablet Take 1 tablet (300 mg total) by mouth at bedtime. 02/11/23   Victoria Ruts, MD  risperiDONE  (RISPERDAL ) 0.5  MG tablet Take 1 tablet (0.5 mg total) by mouth 2 (two) times daily at 8 am and 4 pm. 02/11/23   Victoria Ruts, MD  traMADol  (ULTRAM ) 50 MG tablet Take 1 tablet (50 mg total) by mouth every 12 (twelve) hours as needed for severe pain (pain score 7-10). 02/11/23   Victoria Ruts, MD    Physical Exam: Vitals:   09/26/23 1754 09/26/23 1800 09/26/23 1830 09/26/23 1945  BP: (!) 133/92 (!) 130/98 112/84 (!) 131/91  Pulse: 71 69 70 71  Resp:   15 16  Temp:      TempSrc:      SpO2: 98% 100% 96% 99%  Weight:      Height:       Constitutional: Obese, stable no distress NAD, calm, comfortable Eyes: PERRL, lids and conjunctivae normal ENMT: Mucous membranes are moist. Posterior pharynx clear of any exudate or lesions.Normal dentition.  Neck: normal, supple, no masses, no  thyromegaly Respiratory: clear to auscultation bilaterally, no wheezing, no crackles. Normal respiratory effort. No accessory muscle use.  Cardiovascular: Regular rate and rhythm, no murmurs / rubs / gallops. No extremity edema. 2+ pedal pulses. No carotid bruits.  Abdomen: no tenderness, no masses palpated. No hepatosplenomegaly. Bowel sounds positive.  Musculoskeletal: Good range of motion, no joint swelling or tenderness, Skin: no rashes, lesions, ulcers. No induration Neurologic: Right upper extremity drift with some power 4 out of 5, decreased power also on bilateral lower extremity 4 out of 5 Psychiatric: Normal judgment and insight. Alert and oriented x 3. Normal mood  Data Reviewed:  Vitals appear stable CBC within normal.  Glucose 105 calcium  8.5 albumin 3.3.  EKG showed normal sinus rhythm.  CT code stroke showed age-related atrophy and mild cerebral white matter disease.  CT angiogram of the head and neck showed moderate atheromatous disease within the anterior and middle cerebral arteries especially M1 segment.  There is fusiform ectasia of the origin of the basilar artery and mild calcific atheromatous disease  within the left carotid artery.  Assessment and Plan:  #1 TIA versus CVA: Patient has recent CVA with TNK administration.  No residual symptoms back pain but now has new events.  Will admit the patient.  Repeat MRI of the brain.  Patient has had other workup done at the time.  Neurology on board and will follow recommendations.  Depending on the MRI results we will consider further testing.  In the meantime patient should continue on aspirin .  He notably could not get statins and was on fish oil only.  We will evaluate the possibility of patient taking statin again.  #2 hyperlipidemia: Again he has apparently had problem with statins in the past.  Will evaluate the possibilities of patient taking statin again.  #3 tobacco abuse: Nicotine  patch will be added  #4 essential hypertension: Permissive hypertension.  Resume home regimen.  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 Merrianne, neurologist  Family Communication: No family at bedside  Severity of Illness: The appropriate patient status for this patient is OBSERVATION. Observation status is judged to be reasonable and necessary in order to provide the required intensity of service to ensure the patient's safety. The patient's presenting symptoms, physical exam findings, and initial radiographic and laboratory data in the context of their medical condition is felt to place them at decreased risk for further clinical deterioration. Furthermore, it is anticipated that the patient will be medically stable for discharge from the hospital within 2 midnights of admission.   AuthorBETHA SIM KNOLL, MD 09/26/2023 8:09 PM  For on call review [email or web address].

## 2023-09-27 ENCOUNTER — Observation Stay (HOSPITAL_BASED_OUTPATIENT_CLINIC_OR_DEPARTMENT_OTHER): Payer: MEDICAID

## 2023-09-27 ENCOUNTER — Observation Stay (HOSPITAL_COMMUNITY): Payer: MEDICAID

## 2023-09-27 DIAGNOSIS — E7800 Pure hypercholesterolemia, unspecified: Secondary | ICD-10-CM

## 2023-09-27 DIAGNOSIS — R2990 Unspecified symptoms and signs involving the nervous system: Secondary | ICD-10-CM

## 2023-09-27 DIAGNOSIS — R29818 Other symptoms and signs involving the nervous system: Secondary | ICD-10-CM | POA: Diagnosis not present

## 2023-09-27 DIAGNOSIS — R531 Weakness: Secondary | ICD-10-CM | POA: Diagnosis not present

## 2023-09-27 DIAGNOSIS — I10 Essential (primary) hypertension: Secondary | ICD-10-CM | POA: Diagnosis not present

## 2023-09-27 DIAGNOSIS — Z720 Tobacco use: Secondary | ICD-10-CM

## 2023-09-27 DIAGNOSIS — K219 Gastro-esophageal reflux disease without esophagitis: Secondary | ICD-10-CM | POA: Diagnosis not present

## 2023-09-27 DIAGNOSIS — E785 Hyperlipidemia, unspecified: Secondary | ICD-10-CM

## 2023-09-27 DIAGNOSIS — F259 Schizoaffective disorder, unspecified: Secondary | ICD-10-CM | POA: Diagnosis not present

## 2023-09-27 DIAGNOSIS — I672 Cerebral atherosclerosis: Secondary | ICD-10-CM | POA: Diagnosis not present

## 2023-09-27 DIAGNOSIS — F321 Major depressive disorder, single episode, moderate: Secondary | ICD-10-CM | POA: Diagnosis not present

## 2023-09-27 DIAGNOSIS — G459 Transient cerebral ischemic attack, unspecified: Secondary | ICD-10-CM | POA: Diagnosis not present

## 2023-09-27 LAB — COMPREHENSIVE METABOLIC PANEL WITH GFR
ALT: 32 U/L (ref 0–44)
AST: 40 U/L (ref 15–41)
Albumin: 3 g/dL — ABNORMAL LOW (ref 3.5–5.0)
Alkaline Phosphatase: 61 U/L (ref 38–126)
Anion gap: 8 (ref 5–15)
BUN: 9 mg/dL (ref 6–20)
CO2: 24 mmol/L (ref 22–32)
Calcium: 8.2 mg/dL — ABNORMAL LOW (ref 8.9–10.3)
Chloride: 106 mmol/L (ref 98–111)
Creatinine, Ser: 0.95 mg/dL (ref 0.61–1.24)
GFR, Estimated: >60 mL/min (ref >60)
Glucose, Bld: 90 mg/dL (ref 70–99)
Potassium: 3.5 mmol/L (ref 3.5–5.1)
Sodium: 138 mmol/L (ref 135–145)
Total Bilirubin: 0.8 mg/dL (ref 0.0–1.2)
Total Protein: 6.7 g/dL (ref 6.5–8.1)

## 2023-09-27 LAB — ECHOCARDIOGRAM COMPLETE BUBBLE STUDY
Area-P 1/2: 4.17 cm2
Calc EF: 56.2 %
Height: 72 in
S' Lateral: 3.1 cm
Single Plane A2C EF: 59.3 %
Single Plane A4C EF: 56.7 %
Weight: 3920 [oz_av]

## 2023-09-27 LAB — LIPID PANEL
Cholesterol: 112 mg/dL (ref 0–200)
HDL: 25 mg/dL — ABNORMAL LOW (ref >40)
LDL Cholesterol: 74 mg/dL (ref 0–99)
Total CHOL/HDL Ratio: 4.5 ratio
Triglycerides: 67 mg/dL (ref <150)
VLDL: 13 mg/dL (ref 0–40)

## 2023-09-27 LAB — HIV ANTIBODY (ROUTINE TESTING W REFLEX): HIV Screen 4th Generation wRfx: NONREACTIVE

## 2023-09-27 LAB — RAPID URINE DRUG SCREEN, HOSP PERFORMED
Amphetamines: NOT DETECTED
Barbiturates: NOT DETECTED
Benzodiazepines: POSITIVE — AB
Cocaine: NOT DETECTED
Opiates: NOT DETECTED
Tetrahydrocannabinol: POSITIVE — AB

## 2023-09-27 LAB — CBC
HCT: 44.6 % (ref 39.0–52.0)
Hemoglobin: 14.4 g/dL (ref 13.0–17.0)
MCH: 30 pg (ref 26.0–34.0)
MCHC: 32.3 g/dL (ref 30.0–36.0)
MCV: 92.9 fL (ref 80.0–100.0)
Platelets: 154 K/uL (ref 150–400)
RBC: 4.8 MIL/uL (ref 4.22–5.81)
RDW: 13.7 % (ref 11.5–15.5)
WBC: 6 K/uL (ref 4.0–10.5)
nRBC: 0 % (ref 0.0–0.2)

## 2023-09-27 LAB — BLOOD GAS, VENOUS
Acid-Base Excess: 4.4 mmol/L — ABNORMAL HIGH (ref 0.0–2.0)
Bicarbonate: 29.2 mmol/L — ABNORMAL HIGH (ref 20.0–28.0)
O2 Saturation: 90 %
Patient temperature: 37
pCO2, Ven: 43 mmHg — ABNORMAL LOW (ref 44–60)
pH, Ven: 7.44 — ABNORMAL HIGH (ref 7.25–7.43)
pO2, Ven: 56 mmHg — ABNORMAL HIGH (ref 32–45)

## 2023-09-27 LAB — HEMOGLOBIN A1C
Hgb A1c MFr Bld: 5.4 % (ref 4.8–5.6)
Mean Plasma Glucose: 108.28 mg/dL

## 2023-09-27 LAB — MAGNESIUM: Magnesium: 2.1 mg/dL (ref 1.7–2.4)

## 2023-09-27 LAB — CK: Total CK: 368 U/L (ref 49–397)

## 2023-09-27 MED ORDER — OXYCODONE HCL 5 MG PO TABS
5.0000 mg | ORAL_TABLET | Freq: Three times a day (TID) | ORAL | Status: DC | PRN
  Administered 2023-09-27 – 2023-09-28 (×3): 5 mg via ORAL
  Filled 2023-09-27 (×3): qty 1

## 2023-09-27 MED ORDER — PERFLUTREN LIPID MICROSPHERE
1.0000 mL | INTRAVENOUS | Status: AC | PRN
  Administered 2023-09-27: 2 mL via INTRAVENOUS

## 2023-09-27 MED ORDER — ATORVASTATIN CALCIUM 40 MG PO TABS
40.0000 mg | ORAL_TABLET | Freq: Every day | ORAL | Status: DC
  Administered 2023-09-27 – 2023-09-29 (×3): 40 mg via ORAL
  Filled 2023-09-27 (×3): qty 1

## 2023-09-27 MED ORDER — CLOPIDOGREL BISULFATE 75 MG PO TABS
75.0000 mg | ORAL_TABLET | Freq: Every day | ORAL | Status: DC
  Administered 2023-09-27 – 2023-09-29 (×3): 75 mg via ORAL
  Filled 2023-09-27 (×3): qty 1

## 2023-09-27 MED ORDER — KETOROLAC TROMETHAMINE 15 MG/ML IJ SOLN
15.0000 mg | Freq: Four times a day (QID) | INTRAMUSCULAR | Status: DC | PRN
  Administered 2023-09-28 (×3): 15 mg via INTRAVENOUS
  Filled 2023-09-27 (×3): qty 1

## 2023-09-27 MED ORDER — METOPROLOL TARTRATE 25 MG PO TABS
25.0000 mg | ORAL_TABLET | Freq: Two times a day (BID) | ORAL | Status: DC
  Administered 2023-09-27 – 2023-09-29 (×5): 25 mg via ORAL
  Filled 2023-09-27 (×5): qty 1

## 2023-09-27 MED ORDER — TRAMADOL HCL 50 MG PO TABS
50.0000 mg | ORAL_TABLET | Freq: Two times a day (BID) | ORAL | Status: DC | PRN
  Administered 2023-09-27: 50 mg via ORAL
  Filled 2023-09-27: qty 1

## 2023-09-27 MED ORDER — CYCLOBENZAPRINE HCL 10 MG PO TABS
10.0000 mg | ORAL_TABLET | Freq: Three times a day (TID) | ORAL | Status: DC | PRN
  Administered 2023-09-27 – 2023-09-29 (×4): 10 mg via ORAL
  Filled 2023-09-27 (×4): qty 1

## 2023-09-27 NOTE — Plan of Care (Signed)
  Problem: Ischemic Stroke/TIA Tissue Perfusion: Goal: Complications of ischemic stroke/TIA will be minimized Outcome: Progressing   Problem: Coping: Goal: Will verbalize positive feelings about self Outcome: Progressing   Problem: Self-Care: Goal: Ability to communicate needs accurately will improve Outcome: Progressing   Problem: Clinical Measurements: Goal: Will remain free from infection Outcome: Progressing   Problem: Activity: Goal: Risk for activity intolerance will decrease Outcome: Progressing   Problem: Nutrition: Goal: Adequate nutrition will be maintained Outcome: Progressing   Problem: Pain Managment: Goal: General experience of comfort will improve and/or be controlled Outcome: Progressing   Problem: Safety: Goal: Ability to remain free from injury will improve Outcome: Progressing   Problem: Skin Integrity: Goal: Risk for impaired skin integrity will decrease Outcome: Progressing

## 2023-09-27 NOTE — Progress Notes (Signed)
 Echocardiogram 2D Echocardiogram has been performed.  Amad Mau N Merlyn Bollen,RDCS 09/27/2023, 11:10 AM

## 2023-09-27 NOTE — Evaluation (Signed)
 Physical Therapy Evaluation Patient Details Name: Travis [name] MRN: [id number] DOB: Apr 18, 1965 Today's Date: 09/27/2023  History of Present Illness  Pt is a 58 y.o. male who presented 09/26/23 with acute onset of R-sided weakness and headache. CT without contrast showed no acute findings. 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 MRI negative for acute intracranial abnormality. Of note, pt was recently admitted to [facility] in [place] with a code stroke, had presented there with slurred speech, ataxia and confusion, and received TNK at the time. Subsequent imaging showed intracerebral aneurysm and was discharged home only last week. PMH: anxiety disorder, degenerative disc disease, PTSD, PE, seizure disorder, HTN, HLD, GERD, morbid obesity, polysubstance abuse, spinal stenosis, suicide attempt   Clinical Impression  Pt presents with condition above and deficits mentioned below, see PT Problem List. PTA, he was independent without DME, living with his sister and brother-in-law in a 1-level house with a ramped entrance. His sister and brother-in-law are limited in their ability to provide physical assistance due to having health issues themselves currently, per pt. Currently, the pt is demonstrating deficits in bil lower extremity strength and sensation (worse on R than L), balance, activity tolerance, cognition, and power. He is requiring minAx2 or modAx1 to transfer to stand and minA to ambulate up to ~15 ft with a RW. Pt is limited by chronic back pain. He is motivated to participate and improve to return to being independent though. Thus, recommending intensive inpatient rehab, >3 hours/day, to ideally progress him to a supervision level and maximize his return to baseline and decrease caregiver burden. Will continue to follow acutely. Educated pt on habituation exercises to improve his dizziness with smooth pursuits.       If plan is  discharge home, recommend the following: A lot of help with walking and/or transfers;A lot of help with bathing/dressing/bathroom;Assistance with cooking/housework;Direct supervision/assist for medications management;Direct supervision/assist for financial management;Assist for transportation;Help with stairs or ramp for entrance   Can travel by private vehicle        Equipment Recommendations BSC/3in1;Rollator (4 wheels) (bariatric)  Recommendations for Other Services  Rehab consult    Functional Status Assessment Patient has had a recent decline in their functional status and demonstrates the ability to make significant improvements in function in a reasonable and predictable amount of time.     Precautions / Restrictions Precautions Precautions: Fall Precaution/Restrictions Comments: watch vitals, dizzy with mobility Restrictions Weight Bearing Restrictions Per Provider Order: No      Mobility  Bed Mobility               General bed mobility comments: Pt sitting EOB with OT upon arrival, in reclined end of session    Transfers Overall transfer level: Needs assistance Equipment used: Rolling walker (2 wheels), 2 person hand held assist Transfers: Sit to/from Stand Sit to Stand: Min assist, +2 physical assistance, +2 safety/equipment, Mod assist           General transfer comment: Pt required minAx2 to transfer to stand with bil HHA. When provided a RW, pt required modA to power up to stand. Cues needed to scoot to edge and for hand placement with transfers.    Ambulation/Gait Ambulation/Gait assistance: Min assist Gait Distance (Feet): 15 Feet Assistive device: Rolling walker (2 wheels) Gait Pattern/deviations: Step-through pattern, Decreased step length - right, Decreased step length - left, Decreased stride length, Trunk flexed Gait velocity: reduced Gait velocity interpretation: <1.31 ft/sec, indicative  of household ambulator   General Gait Details: Pt  takes slow, small steps with a flexed posture. MinA needed for balance. Cues provided for directing pt towards the chair.  Stairs            Wheelchair Mobility     Tilt Bed    Modified Rankin (Stroke Patients Only) Modified Rankin (Stroke Patients Only) Pre-Morbid Rankin Score: No symptoms Modified Rankin: Moderately severe disability     Balance Overall balance assessment: Needs assistance Sitting-balance support: No upper extremity supported, Feet supported Sitting balance-Leahy Scale: Fair Sitting balance - Comments: supervision sitting statically EOB   Standing balance support: Bilateral upper extremity supported, During functional activity, Reliant on assistive device for balance Standing balance-Leahy Scale: Poor Standing balance comment: reliant on RW                             Pertinent Vitals/Pain Pain Assessment Pain Assessment: Faces Faces Pain Scale: Hurts even more Pain Location: back (chronic) Pain Descriptors / Indicators: Discomfort, Grimacing, Guarding, Sore Pain Intervention(s): Limited activity within patient's tolerance, Monitored during session, Repositioned    Home Living Family/patient expects to be discharged to:: Private residence Living Arrangements: Other relatives (sister and brother in law) Available Help at Discharge: Family;Available 24 hours/day Type of Home: House Home Access: Ramped entrance       Home Layout: One level Home Equipment: Pharmacist, hospital (2 wheels)      Prior Function Prior Level of Function : Independent/Modified Independent             Mobility Comments: no AD, reports he may furniture walk. ADLs Comments: reports ind, does own iADLs including med management.     Extremity/Trunk Assessment   Upper Extremity Assessment Upper Extremity Assessment: Defer to OT evaluation    Lower Extremity Assessment Lower Extremity Assessment: RLE deficits/detail;LLE deficits/detail RLE  Deficits / Details: diminished sensation throughout leg, worse distally; MMT scores of 2+ hip flexion, 3- knee extension, 2+ ankle dorsiflexion RLE Sensation: decreased light touch LLE Deficits / Details: diminished sensation at edges of foot per pt, intact elsewhere; MMT scores of 3- hip flexion, 3+ knee extension, 3+ ankle dorsiflexion LLE Sensation: decreased light touch    Cervical / Trunk Assessment Cervical / Trunk Assessment: Kyphotic;Other exceptions Cervical / Trunk Exceptions: chronic back pain  Communication   Communication Communication: No apparent difficulties    Cognition Arousal: Alert Behavior During Therapy: Flat affect   PT - Cognitive impairments: No family/caregiver present to determine baseline, Attention, Problem solving, Sequencing, Initiation                       PT - Cognition Comments: Delayed processing and initiation noted. Flat affect throughout. Needs cues to sequence and initiate mobility and to problem solve hand placement and prepation to transfer to stand Following commands: Impaired Following commands impaired: Follows one step commands with increased time, Follows multi-step commands inconsistently, Follows multi-step commands with increased time, Only follows one step commands consistently     Cueing Cueing Techniques: Verbal cues, Tactile cues     General Comments General comments (skin integrity, edema, etc.): SBP 120s sitting prior to standing and 120s sitting after ambulating; pt reported lightheadedness with transferring to stand along with spinning dizziness, noted some R beating nystagmus with far R tracking; pt reported dizziness with smooth pursuits, OT provided pt with handout on habituation exercises and PT reviewed them with pt  Exercises     Assessment/Plan    PT Assessment Patient needs continued PT services  PT Problem List Decreased strength;Decreased balance;Decreased activity tolerance;Decreased mobility;Decreased  coordination;Decreased cognition;Decreased knowledge of use of DME;Impaired sensation;Pain       PT Treatment Interventions DME instruction;Gait training;Functional mobility training;Therapeutic exercise;Therapeutic activities;Balance training;Neuromuscular re-education;Cognitive remediation;Patient/family education    PT Goals (Current goals can be found in the Care Plan section)  Acute Rehab PT Goals Patient Stated Goal: to improve PT Goal Formulation: With patient Time For Goal Achievement: 10/11/23 Potential to Achieve Goals: Good    Frequency Min 3X/week     Co-evaluation               AM-PAC PT 6 Clicks Mobility  Outcome Measure Help needed turning from your back to your side while in a flat bed without using bedrails?: A Little Help needed moving from lying on your back to sitting on the side of a flat bed without using bedrails?: A Little Help needed moving to and from a bed to a chair (including a wheelchair)?: A Lot Help needed standing up from a chair using your arms (e.g., wheelchair or bedside chair)?: A Lot Help needed to walk in hospital room?: Total (<20 ft) Help needed climbing 3-5 steps with a railing? : Total 6 Click Score: 12    End of Session Equipment Utilized During Treatment: Gait belt Activity Tolerance: Patient tolerated treatment well;Patient limited by pain;Other (comment) (limited by dizziness) Patient left: with call bell/phone within reach;in chair;with chair alarm set Nurse Communication: Mobility status (BP) PT Visit Diagnosis: Unsteadiness on feet (R26.81);Other abnormalities of gait and mobility (R26.89);Muscle weakness (generalized) (M62.81);Difficulty in walking, not elsewhere classified (R26.2);Other symptoms and signs involving the nervous system (R29.898);Dizziness and giddiness (R42);Pain Pain - Right/Left:  (back) Pain - part of body:  (back)    Time: 9156-9096 PT Time Calculation (min) (ACUTE ONLY): 20 min   Charges:   PT  Evaluation $PT Eval Moderate Complexity: 1 Mod   PT General Charges $$ ACUTE PT VISIT: 1 Visit         Theo Ferretti, PT, DPT Acute Rehabilitation Services  Office: [phone]   Theo CHRISTELLA Ferretti 09/27/2023, 9:47 AM

## 2023-09-27 NOTE — Progress Notes (Signed)
 Received pt from ED, Alert and oriented X4, follows commands, hooked to tele monitoring, oriented to room and surrounding, keep call bell within reach.

## 2023-09-27 NOTE — Evaluation (Signed)
 Occupational Therapy Evaluation Patient Details Name: Travis [name] MRN: [id number] DOB: Jan 12, 1966 Today's Date: 09/27/2023   History of Present Illness   Pt is a 58 y.o. male who presented 09/26/23 with acute onset of R-sided weakness and headache. CT without contrast showed no acute findings. 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 MRI negative for acute intracranial abnormality. Of note, pt was recently admitted to  [facility] in [place] with a code stroke, had presented there with slurred speech, ataxia and confusion, and received TNK at the time. Subsequent imaging showed intracerebral aneurysm and was discharged home only last week. PMH: anxiety disorder, degenerative disc disease, PTSD, PE, seizure disorder, HTN, HLD, GERD, morbid obesity, polysubstance abuse, spinal stenosis, suicide attempt     Clinical Impressions Pt admitted for above, PTA pt reports living with family and being fully ind with his ADLs and mobility, had recent CVA with lingering R sided deficits and dysarthria. Pt presents as generally weak overall with c/o diplopia, he also presents with deficits listed below. Pt needing max A to CGA for Adls and min A for transfers with HHA, followed-up with pt briefly following session to supply him with L lens occluded glasses to which he reports has improved his diplopia. OT to continue following pt acutely to progress pt as able and help transition to next level of care. Patient has the potential to reach Mod I and demos the ability to tolerate 3 hours of therapy. Pt would benefit from an intensive rehab program to help maximize functional independence.      If plan is discharge home, recommend the following:   A little help with walking and/or transfers;A lot of help with bathing/dressing/bathroom;Assistance with cooking/housework;Direct supervision/assist for financial management;Assist for transportation      Functional Status Assessment   Patient has had a recent decline in their functional status and demonstrates the ability to make significant improvements in function in a reasonable and predictable amount of time.     Equipment Recommendations   Other (comment);Tub/shower bench (RW)     Recommendations for Other Services   Rehab consult     Precautions/Restrictions   Precautions Precautions: Fall Precaution/Restrictions Comments: watch vitals, dizzy with mobility Restrictions Weight Bearing Restrictions Per Provider Order: No     Mobility Bed Mobility Overal bed mobility: Needs Assistance Bed Mobility: Supine to Sit, Sit to Sidelying     Supine to sit: Contact guard   Sit to sidelying: Contact guard assist General bed mobility comments: increased time for bed mobility.    Transfers Overall transfer level: Needs assistance Equipment used: 2 person hand held assist Transfers: Sit to/from Stand Sit to Stand: Min assist, +2 physical assistance, +2 safety/equipment, Mod assist           General transfer comment: min A +2 HHA forSTS from EOB      Balance Overall balance assessment: Needs assistance Sitting-balance support: No upper extremity supported, Feet supported Sitting balance-Leahy Scale: Fair Sitting balance - Comments: supervision sitting statically EOB   Standing balance support: Bilateral upper extremity supported, During functional activity Standing balance-Leahy Scale: Poor                             ADL either performed or assessed with clinical judgement   ADL Overall ADL's : Needs assistance/impaired Eating/Feeding: Set up;Sitting   Grooming: Wash/dry face;Contact guard assist;Sitting   Upper Body Bathing: Sitting;Minimal assistance  Lower Body Bathing: Moderate assistance;Sitting/lateral leans   Upper Body Dressing : Sitting;Moderate assistance   Lower Body Dressing: Sitting/lateral leans;Maximal assistance    Toilet Transfer: Stand-pivot;BSC/3in1;Minimal assistance Toilet Transfer Details (indicate cue type and reason): anticipate with OOb mobility back to bed. HHA Toileting- Architect and Hygiene: Moderate assistance;Sit to/from stand       Functional mobility during ADLs: Minimal assistance       Vision Baseline Vision/History: 0 No visual deficits (reading glasses) Ability to See in Adequate Light: 1 Impaired Patient Visual Report: Diplopia;Blurring of vision Vision Assessment?: Yes Eye Alignment: Within Functional Limits Ocular Range of Motion: Within Functional Limits Alignment/Gaze Preference: Within Defined Limits Tracking/Visual Pursuits: Decreased smoothness of horizontal tracking;Decreased smoothness of vertical tracking Convergence: Impaired - to be further tested in functional context Visual Fields:  (with R eye occluded pt reports increased blurriness of objects.) Diplopia Assessment: Objects split side to side;Present all the time/all directions Additional Comments: Occluded L lens medially and pt reports impaired diplopia upon testing.     Perception Perception:  (no noted inattention during testing.)       Praxis Praxis: Impaired Praxis Impairment Details: Organization     Pertinent Vitals/Pain Pain Assessment Pain Assessment: Faces Faces Pain Scale: Hurts even more Pain Location: back (chronic) Pain Descriptors / Indicators: Discomfort, Grimacing, Guarding, Sore Pain Intervention(s): Limited activity within patient's tolerance, Monitored during session, Repositioned     Extremity/Trunk Assessment Upper Extremity Assessment Upper Extremity Assessment: Generalized weakness;RUE deficits/detail;LUE deficits/detail;Right hand dominant RUE Deficits / Details: RUE limited shoulder flexion ~50 deg AROM, 3/5 MMT throughout with diminished sensation more prominent in distal phalanges RUE Sensation: decreased light touch RUE Coordination: decreased gross  motor;decreased fine motor LUE Deficits / Details: generally weak, AROM shoulder flex 90 deg, 3+/5 MMT throughout. LUE Sensation: WNL LUE Coordination: decreased gross motor   Lower Extremity Assessment Lower Extremity Assessment: RLE deficits/detail;LLE deficits/detail RLE Deficits / Details: diminished sensation throughout leg, worse distally; MMT scores of 2+ hip flexion, 3- knee extension, 2+ ankle dorsiflexion RLE Sensation: decreased light touch LLE Deficits / Details: diminished sensation at edges of foot per pt, intact elsewhere; MMT scores of 3- hip flexion, 3+ knee extension, 3+ ankle dorsiflexion LLE Sensation: decreased light touch   Cervical / Trunk Assessment Cervical / Trunk Assessment: Kyphotic;Other exceptions Cervical / Trunk Exceptions: chronic back pain   Communication Communication Communication: Impaired Factors Affecting Communication: Other (comment);Reduced clarity of speech (dysartrhia from recent CVA)   Cognition Arousal: Alert Behavior During Therapy: Flat affect Cognition: No family/caregiver present to determine baseline                               Following commands: Impaired Following commands impaired: Follows one step commands with increased time, Follows multi-step commands inconsistently, Follows multi-step commands with increased time, Only follows one step commands consistently     Cueing  General Comments   Cueing Techniques: Verbal cues;Tactile cues  OT returned to follow-up wiht pt on diplopia and Lens occlusion. he was returned to bed in sidelying position for echo tech.   Exercises     Shoulder Instructions      Home Living Family/patient expects to be discharged to:: Private residence Living Arrangements: Other relatives (sister and brother in law) Available Help at Discharge: Family;Available 24 hours/day Type of Home: House Home Access: Ramped entrance     Home Layout: One level     Bathroom Shower/Tub:  Tub/shower unit  Bathroom Toilet: Standard     Home Equipment: Pharmacist, hospital (2 wheels)          Prior Functioning/Environment Prior Level of Function : Independent/Modified Independent             Mobility Comments: no AD, reports he may furniture walk. ADLs Comments: reports ind, does own iADLs including med management.    OT Problem List: Decreased strength;Impaired balance (sitting and/or standing);Decreased activity tolerance;Pain;Decreased cognition;Decreased range of motion;Impaired UE functional use;Impaired vision/perception;Decreased knowledge of use of DME or AE;Impaired sensation   OT Treatment/Interventions: Self-care/ADL training;Therapeutic exercise;Patient/family education;Visual/perceptual remediation/compensation;Balance training;Therapeutic activities;DME and/or AE instruction      OT Goals(Current goals can be found in the care plan section)   Acute Rehab OT Goals Patient Stated Goal: to get better OT Goal Formulation: With patient Time For Goal Achievement: 10/11/23 Potential to Achieve Goals: Good ADL Goals Pt Will Perform Grooming: with supervision;standing Pt Will Perform Lower Body Dressing: with supervision;sit to/from stand Pt Will Transfer to Toilet: with supervision;ambulating Pt Will Perform Toileting - Clothing Manipulation and hygiene: with supervision;sit to/from stand Additional ADL Goal #1: Pt will be ind with gaze stabilization exercises to improve vestibular function.   OT Frequency:  Min 2X/week    Co-evaluation              AM-PAC OT 6 Clicks Daily Activity     Outcome Measure Help from another person eating meals?: A Little Help from another person taking care of personal grooming?: A Little Help from another person toileting, which includes using toliet, bedpan, or urinal?: A Lot Help from another person bathing (including washing, rinsing, drying)?: A Lot Help from another person to put on and taking  off regular upper body clothing?: A Lot Help from another person to put on and taking off regular lower body clothing?: A Lot 6 Click Score: 14   End of Session Equipment Utilized During Treatment: Gait belt Nurse Communication: Mobility status  Activity Tolerance: Patient tolerated treatment well Patient left: in bed;with call bell/phone within reach;Other (comment) (ECHO tech at bedside for scans)  OT Visit Diagnosis: Unsteadiness on feet (R26.81);Other abnormalities of gait and mobility (R26.89);Muscle weakness (generalized) (M62.81);Pain;Low vision, both eyes (H54.2);Other symptoms and signs involving the nervous system (R29.898) Pain - part of body:  (back)                Time: 9170-9157 OT Time Calculation (min): 13 min Charges:  OT General Charges $OT Visit: 1 Visit OT Evaluation $OT Eval High Complexity: 1 High  09/27/2023  AB, OTR/L  Acute Rehabilitation Services  Office: [phone]   Curtistine JONETTA Das 09/27/2023, 10:43 AM

## 2023-09-27 NOTE — Evaluation (Signed)
 Speech Language Pathology Evaluation Patient Details Name: Travis [name] MRN: [id number] DOB: 12-01-1965 Today's Date: 09/27/2023 Time: 8866-8847 SLP Time Calculation (min) (ACUTE ONLY): 19 min  Problem List:  Patient Active Problem List   Diagnosis Date Noted   TIA (transient ischemic attack) 09/26/2023   MDD (major depressive disorder), recurrent, severe, with psychosis (HCC) 02/11/2023   Mild tetrahydrocannabinol (THC) abuse 01/28/2023   Schizoaffective psychosis (HCC) 01/23/2023   Methamphetamine abuse (HCC) 01/23/2023   Major depressive disorder, recurrent episode, severe (HCC) 01/22/2023   Adjustment disorder with mixed anxiety and depressed mood 01/21/2023   Homelessness 01/21/2023   Suicide ideation 01/20/2023   Leukopenia    Other pancytopenia (HCC)    FUO (fever of unknown origin) 03/24/2021   Fever 03/24/2021   Hematuria 03/24/2021   Pulmonary embolism (HCC) 04/17/2018   Bradycardia, drug induced 03/01/2018   Hyperglycemia 03/01/2018   Clonidine  overdose 03/01/2018   ARF (acute renal failure) (HCC) 09/01/2015   Hypotension 09/01/2015   Acute encephalopathy 09/01/2015   Hematochezia 04/07/2014   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   Major depressive disorder, single episode, moderate (HCC) 10/12/2013   Morbid obesity (HCC) 10/12/2013   Essential hypertension 10/12/2013   Chronic pain syndrome 10/12/2013   Major depressive disorder, recurrent severe without psychotic features (HCC) 09/29/2013   Rectal pain 08/10/2013   Tachycardia 07/27/2013   Urine incontinence 07/27/2013   Rectal bleeding 07/27/2013   Left arm pain 03/29/2013   Lumbar radicular pain 03/23/2013   Bilateral shoulder pain 10/27/2012   Smoking 10/27/2012   Dental abscess 07/14/2012   Hypertension    Anxiety    Spinal stenosis    Tobacco abuse    Obesity    Dyslipidemia    Polysubstance abuse (HCC)    Chest pain    Grief 06/11/2012   Panic attack 01/21/2012   HYPERCHOLESTEROLEMIA  02/18/2006   DEPRESSION 02/18/2006   GERD 02/18/2006   HEADACHE 02/18/2006   Past Medical History:  Past Medical History:  Diagnosis Date   Anxiety    Panic attacks   Chest pain    Hospital, March, 2014, negative enzymes, patient refused in-hospital  stress test, patient canceled outpatient stress test   Constipation    Degenerative disk disease    Degenerative disk disease    Depression    GERD (gastroesophageal reflux disease)    Hypertension    Incontinence of urine    Neuromuscular disorder (HCC)    Obesity    Polysubstance abuse (HCC)    PTSD (post-traumatic stress disorder)    Pulmonary emboli (HCC)    Seizures (HCC)    Shortness of breath dyspnea    with exertion   Spinal stenosis    Spinal stenosis    Suicide attempt (HCC)    Tachycardia - pulse    Tobacco abuse    Past Surgical History:  Past Surgical History:  Procedure Laterality Date   COLONOSCOPY N/A 08/24/2014   Procedure: COLONOSCOPY;  Surgeon: Jerrell Sol, MD;  Location: [facility] ENDOSCOPY;  Service: Endoscopy;  Laterality: N/A;   ESOPHAGOGASTRODUODENOSCOPY (EGD) WITH PROPOFOL  N/A 08/24/2014   Procedure: ESOPHAGOGASTRODUODENOSCOPY (EGD) WITH PROPOFOL ;  Surgeon: Jerrell Sol, MD;  Location: [facility] ENDOSCOPY;  Service: Endoscopy;  Laterality: N/A;   WISDOM TOOTH EXTRACTION     HPI:  Pt is a 58 y.o. male who presented 09/26/23 with acute onset of R-sided weakness and headache. CT without contrast showed no acute findings. 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  MRI negative for acute intracranial abnormality. Of note, pt was recently admitted to [facility] in [place] with a code stroke, had presented there with slurred speech, ataxia and confusion, and received TNK at the time. Subsequent imaging showed intracerebral aneurysm and was discharged home only last week. PMH: anxiety disorder, degenerative disc disease, PTSD, PE, seizure disorder, HTN,  HLD, GERD, morbid obesity, polysubstance abuse, spinal stenosis, suicide attempt.   Assessment / Plan / Recommendation Clinical Impression  Pt presents with new cognitive communication deficits and worsened dysarthria since previous admission to [facility]. He reports specifically that his speech improved after discharge from [facility] and subsequently worsened with acute onset of R-sided weakness and headache leading to current admission to [facility]. He is oriented x4 and demonstrated some intellecutal awareness of speech deficits. He also did well with immediate recall of listed items (5/5), but demonstrated difficulty with delayed recall (4/5) and recall of simple information (75%) read in Stroke packet. Expressive communication appears [facility] for tasks provided, however he reports difficulty with word finding in conversation (not noted by clinician), and this could be primarily related to memory deficits vs expressive communication. Pain level was 10/10 in his back and this would also have impacted performance today. Will continue f/u for ongoing assessment of language. 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 Increased vocal volume, slowed speech and over articulation improved speech intelligiblity at the sentence level. Recommend SLP services f/u for tx of cogntive deficits and dysarthria during acute stay. F/u from SLP at next venue of care also recommended in order to improve functional communication and functional recall for everyday tasks/living.    SLP Assessment  SLP Recommendation/Assessment: Patient needs continued Speech Language Pathology Services SLP Visit Diagnosis: Cognitive communication deficit (R41.841);Dysarthria and anarthria (R47.1)     Assistance Recommended at Discharge     Functional Status Assessment Patient has had a recent decline in their functional status and  demonstrates the ability to make significant improvements in function in a reasonable and predictable amount of time.  Frequency and Duration min 2x/week  2 weeks      SLP Evaluation Cognition  Overall Cognitive Status: Impaired/Different from baseline Arousal/Alertness: Awake/alert Orientation Level: Oriented X4 Year: 2025 Month: July Day of Week: Correct Attention: Sustained Sustained Attention: Appears intact Memory: Impaired Memory Impairment: Storage deficit;Retrieval deficit;Decreased recall of new information Awareness: Impaired Awareness Impairment: Intellectual impairment       Comprehension  Auditory Comprehension Overall Auditory Comprehension: Appears within functional limits for tasks assessed Yes/No Questions: Within Functional Limits Commands: Within Functional Limits Conversation: Complex Visual Recognition/Discrimination Discrimination: Within Function Limits Reading Comprehension Reading Status: Within funtional limits    Expression Expression Primary Mode of Expression: Verbal Verbal Expression Overall Verbal Expression: Appears within functional limits for tasks assessed Initiation: No impairment Naming: No impairment Written Expression Written Expression: Not tested   Oral / Motor  Oral Motor/Sensory Function Overall Oral Motor/Sensory Function: Moderate impairment Facial Symmetry: Abnormal symmetry right;Suspected CN VII (facial) dysfunction Lingual ROM: Within Functional Limits Lingual Symmetry: Within Functional Limits Motor Speech Overall Motor Speech: Impaired Respiration: Impaired Level of Impairment: Word Phonation: Normal Resonance: Within functional limits Articulation: Impaired Level of Impairment: Word Intelligibility: Intelligibility reduced Word: 75-100% accurate Phrase: 50-74% accurate Sentence: 50-74% accurate Motor Planning: Within functional limits Motor Speech Errors: Aware Effective Techniques: Slow rate;Increased  vocal intensity;Over-articulate       Wilder Kin, MA, CCC-SLP Acute Rehabilitation Services Office  Number: [phone]        Wilder KANDICE Kin 09/27/2023, 12:16 PM

## 2023-09-27 NOTE — Progress Notes (Signed)
 Pt off from unit for MRI

## 2023-09-27 NOTE — Progress Notes (Signed)
 PROGRESS NOTE  Travis [name] FMW:[id number] DOB: 1965-04-15   PCP: Scarlett [name], NP  Patient is from: Home.  DOA: 09/26/2023 LOS: 0  Chief complaints Chief Complaint  Patient presents with   Code Stroke     Brief Narrative / Interim history: 58 year old M with PMH of PE no longer on AC, recent TIA s/p TNK at [facility], intracerebral aneurysm, obesity, remote seizure, substance abuse, DDD, anxiety, PTSD, HTN, HLD and GERD brought to ED as code stroke due to right-sided weakness and severe headache, and admitted for CVA workup.  Patient was hospitalized at [facility] from 7/18-7/23 with strokelike symptoms including dysarthria, ataxia, sensory loss and LUE weakness for which she had tenecteplase.  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  Patient was discharged home with DME and outpatient therapy referral.   In ED, stable vitals.  CMP and CBC without acute finding.  CT head without acute finding.  CT angio head and neck redemonstrated basilar aneurysm and also showed moderate left M1 and A1 atherosclerotic disease.  Neurology consulted.  MRI brain without acute finding.   Subjective: Seen and examined earlier this morning.  No major events overnight or this morning.  Complains of persistent right-sided weakness, paresthesia, bifrontal headache, blurry/double vision.  Denies chest pain, shortness of breath, GI or UTI symptoms.  Objective: Vitals:   09/26/23 2338 09/27/23 0435 09/27/23 0722 09/27/23 1116  BP: 112/75 113/79 123/81 (!) 143/105  Pulse: 63 79 78 81  Resp: 18 18 20 17   Temp: 98.4 F (36.9 C) 98.2 F (36.8 C) 97.6 F (36.4 C) 97.7 F (36.5 C)  TempSrc: Oral Oral Oral Oral  SpO2: 95% 95% 95% 96%  Weight:      Height:        Examination:  GENERAL: No apparent distress.  Nontoxic. HEENT: MMM.  Vision and hearing grossly intact.  NECK: Supple.  No  apparent JVD.  RESP:  No IWOB.  Fair aeration bilaterally. CVS:  RRR. Heart sounds normal.  ABD/GI/GU: BS+. Abd soft, NTND.  MSK/EXT:  Moves extremities. No apparent deformity. No edema.  SKIN: no apparent skin lesion or wound NEURO: Awake but not alert.  Oriented appropriately.  Speech clear. Cranial nerves II-XII grossly intact except for poor vision in both eyes.  Motor 3+/5 in RUE and RLE and 5/5 elsewhere.  Diminished light sensation on the right.  Patellar reflex symmetric.  PSYCH: Calm. Normal affect.   Consultants:  Neurology  Procedures: None  Microbiology summarized: None  Assessment and plan: Strokelike symptoms/TIA: Patient with recent TIA status post TNK at [facility] on 7/18.  Presenting with right-sided weakness, numbness, headache.  CT head, CT angio head and neck and MRI brain without acute finding to explain patient's symptoms.  Patient denies history of migraine headache.  UDS positive for benzo and THC.  Denies alcohol.  Smokes about a pack a day.  LDL 74.  A1c 5.4%.  -Appreciate input by neurology-routine CVA workup -Continue Plavix .  Not on aspirin  due to NSAID allergy.  -Follow echocardiogram -PT/OT/SLP -Start statin. - Encouraged smoking cessation.  Essential hypertension: Normotensive.  MRI without acute CVA.  No significant extraoral intracranial stenosis on CT angio head and neck. -Resume home metoprolol     Tobacco use disorder: Reports smoking a pack a day. -Encouraged smoking cessation - Nicotine  patch   Major depressive disorder/PTSD/schizoaffective disorder:  -Continue home regimen of the med  rec   Headache: Unclear etiology.  No history of migraine. -Tylenol  and as needed oxycodone  -Check VBG  Elevated AST: Mild.  CK normal.  Patient denies alcohol. - Continue monitoring  Chronic osteoarthritis -Pain meds as above -Continue home gabapentin  and Flexeril  as well   GERD: Continues PPIs  Class I obesity Body mass index is 33.23 kg/m.            DVT prophylaxis:  enoxaparin  (LOVENOX ) injection 40 mg Start: 09/27/23 1000  Code Status: Full code Family Communication: None at bedside Level of care: Telemetry Medical Status is: Observation The patient will require care spanning > 2 midnights and should be moved to inpatient because: TIA/strokelike symptoms with right hemiparesis and intractable headache   Final disposition: CIR?   55 minutes with more than 50% spent in reviewing records, counseling patient/family and coordinating care.   Sch Meds:  Scheduled Meds:  clopidogrel   75 mg Oral Daily   enoxaparin  (LOVENOX ) injection  40 mg Subcutaneous Q24H   gabapentin   1,200 mg Oral TID   metoprolol  tartrate  25 mg Oral BID   nicotine   14 mg Transdermal Daily   QUEtiapine   400 mg Oral QHS   Continuous Infusions: PRN Meds:.acetaminophen  **OR** acetaminophen  (TYLENOL ) oral liquid 160 mg/5 mL **OR** acetaminophen , cyclobenzaprine , perflutren  lipid microspheres (DEFINITY ) IV suspension, senna-docusate, traMADol   Antimicrobials: Anti-infectives (From admission, onward)    None        I have personally reviewed the following labs and images: CBC: Recent Labs  Lab 09/26/23 1831 09/26/23 2255 09/27/23 1003  WBC 7.1 6.2 6.0  NEUTROABS 3.3  --   --   HGB 13.9 12.6* 14.4  HCT 43.8 38.1* 44.6  MCV 94.4 91.6 92.9  PLT 182 161 154   BMP &GFR Recent Labs  Lab 09/26/23 1831 09/26/23 2255 09/27/23 1003  NA 136  --  138  K 3.7  --  3.5  CL 101  --  106  CO2 26  --  24  GLUCOSE 105*  --  90  BUN 10  --  9  CREATININE 1.12 1.00 0.95  CALCIUM  8.5*  --  8.2*  MG  --   --  2.1   Estimated Creatinine Clearance: 111.8 mL/min (by C-G formula based on SCr of 0.95 mg/dL). Liver & Pancreas: Recent Labs  Lab 09/26/23 1831 09/27/23 1003  AST 56* 40  ALT 37 32  ALKPHOS 74 61  BILITOT 0.8 0.8  PROT 7.1 6.7  ALBUMIN 3.3* 3.0*   No results for input(s): LIPASE, AMYLASE in the last 168 hours. No results  for input(s): AMMONIA in the last 168 hours. Diabetic: Recent Labs    09/27/23 0326  HGBA1C 5.4   Recent Labs  Lab 09/26/23 1742  GLUCAP 111*   Cardiac Enzymes: Recent Labs  Lab 09/27/23 1003  CKTOTAL 368   No results for input(s): PROBNP in the last 8760 hours. Coagulation Profile: No results for input(s): INR, PROTIME in the last 168 hours. Thyroid Function Tests: No results for input(s): TSH, T4TOTAL, FREET4, T3FREE, THYROIDAB in the last 72 hours. Lipid Profile: Recent Labs    09/27/23 0326  CHOL 112  HDL 25*  LDLCALC 74  TRIG 67  CHOLHDL 4.5   Anemia Panel: No results for input(s): VITAMINB12, FOLATE, FERRITIN, TIBC, IRON, RETICCTPCT in the last 72 hours. Urine analysis:    Component Value Date/Time   COLORURINE AMBER (A) 03/23/2021 2053   APPEARANCEUR CLEAR 03/23/2021 2053   LABSPEC 1.030 03/23/2021 2053  PHURINE 5.0 03/23/2021 2053   GLUCOSEU NEGATIVE 03/23/2021 2053   GLUCOSEU NEG mg/dL 90/76/7990 9952   HGBUR LARGE (A) 03/23/2021 2053   BILIRUBINUR NEGATIVE 03/23/2021 2053   BILIRUBINUR SMALL 08/16/2013 1351   KETONESUR NEGATIVE 03/23/2021 2053   PROTEINUR 100 (A) 03/23/2021 2053   UROBILINOGEN 0.2 04/05/2014 0127   NITRITE NEGATIVE 03/23/2021 2053   LEUKOCYTESUR NEGATIVE 03/23/2021 2053   Sepsis Labs: Invalid input(s): PROCALCITONIN, LACTICIDVEN  Microbiology: No results found for this or any previous visit (from the past 240 hours).  Radiology Studies: ECHOCARDIOGRAM COMPLETE BUBBLE STUDY Result Date: 09/27/2023    ECHOCARDIOGRAM REPORT   Patient Name:   JABREEL CHIMENTO Date of Exam: 09/27/2023 Medical Rec #:  [id number]    Height:       72.0 in Accession #:    [id number]   Weight:       245.0 lb Date of Birth:  1965-06-08    BSA:          2.322 m Patient Age:    58 years     BP:           123/81 mmHg Patient Gender: M            HR:           75 bpm. Exam Location:  Inpatient Procedure: 2D Echo, Color Doppler,  Cardiac Doppler, Saline Contrast Bubble Study            and Intracardiac Opacification Agent (Both Spectral and Color Flow            Doppler were utilized during procedure). Indications:    Stroke  History:        Patient has prior history of Echocardiogram examinations, most                 recent 03/25/2021. Pulmonary Embolus, Arrythmias:Tachycardia,                 Signs/Symptoms:Chest Pain and Shortness of Breath; Risk                 Factors:Current Smoker, Hypertension and GERD+.  Sonographer:    Logan Shove RDCS Referring Phys: [id number] CORTNEY E DE LA TORRE IMPRESSIONS  1. Left ventricular ejection fraction, by estimation, is 65 to 70%. The left ventricle has normal function. The left ventricle has no regional wall motion abnormalities. Left ventricular diastolic parameters are consistent with Grade I diastolic dysfunction (impaired relaxation).  2. Right ventricular systolic function is normal. The right ventricular size is normal. Tricuspid regurgitation signal is inadequate for assessing PA pressure.  3. The mitral valve is grossly normal. Trivial mitral valve regurgitation. No evidence of mitral stenosis.  4. The aortic valve is grossly normal. There is mild calcification of the aortic valve. Aortic valve regurgitation is not visualized. Aortic valve sclerosis is present, with no evidence of aortic valve stenosis.  5. Agitated saline contrast bubble study was negative, with no evidence of any interatrial shunt. Comparison(s): No significant change from prior study. Conclusion(s)/Recommendation(s): No intracardiac source of embolism detected on this transthoracic study. Consider a transesophageal echocardiogram to exclude cardiac source of embolism if clinically indicated. FINDINGS  Left Ventricle: Left ventricular ejection fraction, by estimation, is 65 to 70%. The left ventricle has normal function. The left ventricle has no regional wall motion abnormalities. Definity  contrast agent was given IV to  delineate the left ventricular  endocardial borders. The left ventricular internal cavity size was normal in size. There is no left ventricular hypertrophy.  Left ventricular diastolic parameters are consistent with Grade I diastolic dysfunction (impaired relaxation). Right Ventricle: The right ventricular size is normal. No increase in right ventricular wall thickness. Right ventricular systolic function is normal. Tricuspid regurgitation signal is inadequate for assessing PA pressure. Left Atrium: Left atrial size was normal in size. Right Atrium: Right atrial size was normal in size. Pericardium: There is no evidence of pericardial effusion. Presence of epicardial fat layer. Mitral Valve: The mitral valve is grossly normal. Trivial mitral valve regurgitation. No evidence of mitral valve stenosis. Tricuspid Valve: The tricuspid valve is grossly normal. Tricuspid valve regurgitation is not demonstrated. No evidence of tricuspid stenosis. Aortic Valve: The aortic valve is grossly normal. There is mild calcification of the aortic valve. Aortic valve regurgitation is not visualized. Aortic valve sclerosis is present, with no evidence of aortic valve stenosis. Pulmonic Valve: The pulmonic valve was grossly normal. Pulmonic valve regurgitation is not visualized. No evidence of pulmonic stenosis. Aorta: The aortic root and ascending aorta are structurally normal, with no evidence of dilitation. Venous: The inferior vena cava was not well visualized. IAS/Shunts: The interatrial septum was not well visualized. Agitated saline contrast was given intravenously to evaluate for intracardiac shunting. Agitated saline contrast bubble study was negative, with no evidence of any interatrial shunt.  LEFT VENTRICLE PLAX 2D LVIDd:         6.40 cm      Diastology LVIDs:         3.10 cm      LV e' medial:    6.53 cm/s LV PW:         1.20 cm      LV E/e' medial:  11.1 LV IVS:        1.00 cm      LV e' lateral:   8.92 cm/s LVOT diam:      2.00 cm      LV E/e' lateral: 8.1 LVOT Area:     3.14 cm  LV Volumes (MOD) LV vol d, MOD A2C: 110.0 ml LV vol d, MOD A4C: 147.0 ml LV vol s, MOD A2C: 44.8 ml LV vol s, MOD A4C: 63.6 ml LV SV MOD A2C:     65.2 ml LV SV MOD A4C:     147.0 ml LV SV MOD BP:      72.0 ml RIGHT VENTRICLE RV Basal diam:  4.10 cm RV Mid diam:    4.10 cm RV S prime:     12.30 cm/s TAPSE (M-mode): 1.7 cm LEFT ATRIUM             Index LA diam:        3.90 cm 1.68 cm/m LA Vol (A2C):   50.6 ml 21.79 ml/m LA Vol (A4C):   76.2 ml 32.81 ml/m LA Biplane Vol: 64.2 ml 27.64 ml/m   AORTA Ao Root diam: 2.60 cm Ao Asc diam:  3.40 cm MITRAL VALVE MV Area (PHT): 4.17 cm    SHUNTS MV Decel Time: 182 msec    Systemic Diam: 2.00 cm MV E velocity: 72.30 cm/s MV A velocity: 84.40 cm/s MV E/A ratio:  0.86 Darryle Decent MD Electronically signed by Darryle Decent MD Signature Date/Time: 09/27/2023/11:33:47 AM    Final    MR BRAIN WO CONTRAST Result Date: 09/27/2023 EXAM: MRI BRAIN WITHOUT CONTRAST 09/27/2023 04:15:23 AM TECHNIQUE: Multiplanar multisequence MRI of the head/brain was performed without the administration of intravenous contrast. COMPARISON: None available. CLINICAL HISTORY: Transient ischemic attack (TIA). FINDINGS: BRAIN AND VENTRICLES: No acute  infarct. No intracranial hemorrhage. No mass. No midline shift. No hydrocephalus. The sella is unremarkable. Normal flow voids. Periventricular T2 hyperintensities are mildly advanced for age. ORBITS: No acute abnormality. SINUSES AND MASTOIDS: No acute abnormality. BONES AND SOFT TISSUES: Normal marrow signal. No acute soft tissue abnormality. IMPRESSION: 1. No acute intracranial abnormality. 2. Mildly advanced periventricular T2 hyperintensities for age. Electronically signed by: Lonni Necessary MD 09/27/2023 06:52 AM EDT RP Workstation: [id number]   CT ANGIO HEAD NECK W WO CM (CODE STROKE) Result Date: 09/26/2023 CLINICAL DATA:  Acute neurological deficit. EXAM: CT ANGIOGRAPHY HEAD AND NECK  WITH CONTRAST TECHNIQUE: Multidetector CT imaging of the head and neck was performed using the standard protocol during bolus administration of intravenous contrast. Multiplanar CT image reconstructions and MIPs were obtained to evaluate the vascular anatomy. Carotid stenosis measurements (when applicable) are obtained utilizing NASCET criteria, using the distal internal carotid diameter as the denominator. RADIATION DOSE REDUCTION: This exam was performed according to the departmental dose-optimization program which includes automated exposure control, adjustment of the mA and/or kV according to patient size and/or use of iterative reconstruction technique. CONTRAST:  75mL OMNIPAQUE  IOHEXOL  350 MG/ML SOLN COMPARISON:  CT of the head dated September 26, 2023. FINDINGS: CTA NECK FINDINGS Aortic arch: Normal in caliber. Three vessel origin of the great arteries. The brachiocephalic artery is normal in caliber. Right carotid system: The common carotid and internal carotid arteries are normal in caliber and unremarkable. Left carotid system: There is mild calcific atheromatous disease within the common carotid artery with no flow-limiting stenosis. The internal carotid artery is normal in caliber and unremarkable. Vertebral arteries: The vertebral arteries are codominant and normal in caliber throughout the respective courses. Skeleton: Mild degenerative changes throughout the cervical spine. Other neck: Negative. Upper chest: The visualized lung fields are clear. Review of the MIP images confirms the above findings CTA HEAD FINDINGS Anterior circulation: The cavernous and supraclinoid segments of the internal carotid arteries demonstrate mild stenosis, but no flow-limiting stenosis. There is venous contamination within the right cavernous sinus. There is irregular beading of the M1 and A1 branches of the left middle and anterior cerebral arteries, but there is no flow-limiting stenosis. There is also mild beading of the  right M1 segment, also without flow-limiting stenosis. The M2 branches of both middle cerebral arteries demonstrate mild to moderate stenosis, but no large vessel occlusion. The anterior cerebral arteries also demonstrate mild to moderate atheromatous disease without large vessel occlusion. 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 The mid to distal basal artery is normal in caliber. The posterior cerebral arteries and cerebellar arteries are normal in caliber. There is no flow-limiting stenosis. Venous sinuses: Patent and unremarkable. Anatomic variants: None. Review of the MIP images confirms the above findings IMPRESSION: 1. There is moderate atheromatous disease within the anterior and middle cerebral arteries, primarily involving the left M1 and A1 segments. There is also involvement of the M2 branches bilaterally. There is no large vessel occlusion evident. 2. 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 3. Mild calcific atheromatous disease within the left carotid artery without flow-limiting stenosis. Estimated stenosis is less than 10%. These results were called by telephone at the time of interpretation on 09/26/2023 at 6:10 pm to provider [facility] , who verbally acknowledged these results. Electronically Signed   By: Evalene Coho M.D.   On: 09/26/2023 18:23   CT HEAD CODE STROKE WO  CONTRAST Result Date: 09/26/2023 CLINICAL DATA:  Code stroke. Acute neurological deficit with left facial droop and right weakness. EXAM: CT HEAD WITHOUT CONTRAST TECHNIQUE: Contiguous axial images were obtained from the base of the skull through the vertex without intravenous contrast. RADIATION DOSE REDUCTION: This exam was performed according to the departmental dose-optimization program which includes automated exposure control, adjustment of the mA and/or kV according to patient size and/or use of iterative reconstruction  technique. COMPARISON:  CT of the head dated September 01, 2015. FINDINGS: Brain: Mild generalized cerebral volume loss. Mild periventricular white matter disease. No evidence of hemorrhage, mass, acute cortical infarct or hydrocephalus. Vascular: Mild calcific atheromatous disease. Skull: Intact and unremarkable. Sinuses/Orbits: Clear paranasal sinuses and mastoid air cells. Normal orbits. IMPRESSION: 1. Age-related atrophy and mild cerebral white matter disease. 2. ASPECTS is 10. 3. These results were communicated to Dr. Lindzen at 5:56 pm on 09/26/2023 by text page via the [facility] messaging system. Electronically Signed   By: Evalene Coho M.D.   On: 09/26/2023 17:59      Adaliz Dobis T. Joslyne Marshburn [facility]  If 7PM-7AM, please contact night-coverage [email or web address] 09/27/2023, 12:25 PM

## 2023-09-27 NOTE — Progress Notes (Signed)
 Back to unit, vital signs taken, hooked back to tele monitoring, assisted to bathroom with a walker.

## 2023-09-27 NOTE — Plan of Care (Signed)

## 2023-09-27 NOTE — Progress Notes (Addendum)
 STROKE TEAM PROGRESS NOTE    SIGNIFICANT HOSPITAL EVENTS 7/18-7/21- admitted to [facility] after Code stroke and received TNK  INTERIM HISTORY/SUBJECTIVE States symptoms initially started with stroke like episode on 7/18 and was admitted to [facility] with negative neurovascular imaging and thought to be stroke aborted by TNK and then improved but was never at baseline. Exam is variable. Splits midline on forehead with vibration. Endorses continued right sided weakness and sensory deficits.   MRI scan of the brain is negative for acute stroke.  CT angiogram shows moderate atheromatous changes involving middle and anterior cerebral arteries with fusiform ectasia of the basilar artery. OBJECTIVE  CBC    Component Value Date/Time   WBC 6.2 09/26/2023 2255   RBC 4.16 (L) 09/26/2023 2255   HGB 12.6 (L) 09/26/2023 2255   HGB 13.1 04/23/2021 1159   HCT 38.1 (L) 09/26/2023 2255   HCT 40.6 03/27/2021 0247   PLT 161 09/26/2023 2255   PLT 137 (L) 04/23/2021 1159   MCV 91.6 09/26/2023 2255   MCH 30.3 09/26/2023 2255   MCHC 33.1 09/26/2023 2255   RDW 13.5 09/26/2023 2255   LYMPHSABS 2.9 09/26/2023 1831   MONOABS 0.6 09/26/2023 1831   EOSABS 0.3 09/26/2023 1831   BASOSABS 0.1 09/26/2023 1831    BMET    Component Value Date/Time   NA 136 09/26/2023 1831   K 3.7 09/26/2023 1831   CL 101 09/26/2023 1831   CO2 26 09/26/2023 1831   GLUCOSE 105 (H) 09/26/2023 1831   BUN 10 09/26/2023 1831   CREATININE 1.00 09/26/2023 2255   CREATININE 1.36 (H) 04/23/2021 1159   CREATININE 0.89 07/27/2013 1440   CALCIUM  8.5 (L) 09/26/2023 1831   GFRNONAA >60 09/26/2023 2255   GFRNONAA >60 04/23/2021 1159    IMAGING past 24 hours MR BRAIN WO CONTRAST Result Date: 09/27/2023 EXAM: MRI BRAIN WITHOUT CONTRAST 09/27/2023 04:15:23 AM TECHNIQUE: Multiplanar multisequence MRI of the head/brain was performed without the administration of intravenous contrast. COMPARISON: None available. CLINICAL HISTORY:  Transient ischemic attack (TIA). FINDINGS: BRAIN AND VENTRICLES: No acute infarct. No intracranial hemorrhage. No mass. No midline shift. No hydrocephalus. The sella is unremarkable. Normal flow voids. Periventricular T2 hyperintensities are mildly advanced for age. ORBITS: No acute abnormality. SINUSES AND MASTOIDS: No acute abnormality. BONES AND SOFT TISSUES: Normal marrow signal. No acute soft tissue abnormality. IMPRESSION: 1. No acute intracranial abnormality. 2. Mildly advanced periventricular T2 hyperintensities for age. Electronically signed by: Lonni Necessary MD 09/27/2023 06:52 AM EDT RP Workstation: [id number]   CT ANGIO HEAD NECK W WO CM (CODE STROKE) Result Date: 09/26/2023 CLINICAL DATA:  Acute neurological deficit. EXAM: CT ANGIOGRAPHY HEAD AND NECK WITH CONTRAST TECHNIQUE: Multidetector CT imaging of the head and neck was performed using the standard protocol during bolus administration of intravenous contrast. Multiplanar CT image reconstructions and MIPs were obtained to evaluate the vascular anatomy. Carotid stenosis measurements (when applicable) are obtained utilizing NASCET criteria, using the distal internal carotid diameter as the denominator. RADIATION DOSE REDUCTION: This exam was performed according to the departmental dose-optimization program which includes automated exposure control, adjustment of the mA and/or kV according to patient size and/or use of iterative reconstruction technique. CONTRAST:  75mL OMNIPAQUE  IOHEXOL  350 MG/ML SOLN COMPARISON:  CT of the head dated September 26, 2023. FINDINGS: CTA NECK FINDINGS Aortic arch: Normal in caliber. Three vessel origin of the great arteries. The brachiocephalic artery is normal in caliber. Right carotid system: The common carotid and internal carotid arteries are normal in caliber  and unremarkable. Left carotid system: There is mild calcific atheromatous disease within the common carotid artery with no flow-limiting stenosis. The  internal carotid artery is normal in caliber and unremarkable. Vertebral arteries: The vertebral arteries are codominant and normal in caliber throughout the respective courses. Skeleton: Mild degenerative changes throughout the cervical spine. Other neck: Negative. Upper chest: The visualized lung fields are clear. Review of the MIP images confirms the above findings CTA HEAD FINDINGS Anterior circulation: The cavernous and supraclinoid segments of the internal carotid arteries demonstrate mild stenosis, but no flow-limiting stenosis. There is venous contamination within the right cavernous sinus. There is irregular beading of the M1 and A1 branches of the left middle and anterior cerebral arteries, but there is no flow-limiting stenosis. There is also mild beading of the right M1 segment, also without flow-limiting stenosis. The M2 branches of both middle cerebral arteries demonstrate mild to moderate stenosis, but no large vessel occlusion. The anterior cerebral arteries also demonstrate mild to moderate atheromatous disease without large vessel occlusion. 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 The mid to distal basal artery is normal in caliber. The posterior cerebral arteries and cerebellar arteries are normal in caliber. There is no flow-limiting stenosis. Venous sinuses: Patent and unremarkable. Anatomic variants: None. Review of the MIP images confirms the above findings IMPRESSION: 1. There is moderate atheromatous disease within the anterior and middle cerebral arteries, primarily involving the left M1 and A1 segments. There is also involvement of the M2 branches bilaterally. There is no large vessel occlusion evident. 2. 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 3. Mild calcific atheromatous disease within the left carotid artery without flow-limiting stenosis. Estimated stenosis is less than 10%. These results were  called by telephone at the time of interpretation on 09/26/2023 at 6:10 pm to provider ERIC [facility] , who verbally acknowledged these results. Electronically Signed   By: Evalene Coho M.D.   On: 09/26/2023 18:23   CT HEAD CODE STROKE WO CONTRAST Result Date: 09/26/2023 CLINICAL DATA:  Code stroke. Acute neurological deficit with left facial droop and right weakness. EXAM: CT HEAD WITHOUT CONTRAST TECHNIQUE: Contiguous axial images were obtained from the base of the skull through the vertex without intravenous contrast. RADIATION DOSE REDUCTION: This exam was performed according to the departmental dose-optimization program which includes automated exposure control, adjustment of the mA and/or kV according to patient size and/or use of iterative reconstruction technique. COMPARISON:  CT of the head dated September 01, 2015. FINDINGS: Brain: Mild generalized cerebral volume loss. Mild periventricular white matter disease. No evidence of hemorrhage, mass, acute cortical infarct or hydrocephalus. Vascular: Mild calcific atheromatous disease. Skull: Intact and unremarkable. Sinuses/Orbits: Clear paranasal sinuses and mastoid air cells. Normal orbits. IMPRESSION: 1. Age-related atrophy and mild cerebral white matter disease. 2. ASPECTS is 10. 3. These results were communicated to Dr. Lindzen at 5:56 pm on 09/26/2023 by text page via the [facility] messaging system. Electronically Signed   By: Evalene Coho M.D.   On: 09/26/2023 17:59    Vitals:   09/26/23 2142 09/26/23 2338 09/27/23 0435 09/27/23 0722  BP: 118/83 112/75 113/79 123/81  Pulse: 68 63 79 78  Resp: 18 18 18 20   Temp: 97.6 F (36.4 C) 98.4 F (36.9 C) 98.2 F (36.8 C) 97.6 F (36.4 C)  TempSrc: Oral Oral Oral Oral  SpO2: 96% 95% 95% 95%  Weight:  Height:         PHYSICAL EXAM General:  Alert, well-nourished, well-developed patient in no acute distress Psych:  Mood and affect appropriate for situation CV: Regular rate and rhythm on  monitor Respiratory:  Regular, unlabored respirations on room air GI: Abdomen soft and nontender   NEURO:  Mental Status: AA&Ox3, patient is able to give clear and coherent history Speech/Language: speech is without dysarthria or aphasia.  Naming, repetition, fluency, and comprehension intact.  Cranial Nerves:  II: PERRL. Visual fields full.  III, IV, VI: EOMI. Eyelids elevate symmetrically.  V: Sensation is intact to light touch and symmetrical to face.  VII: Face is symmetrical resting and smiling VIII: hearing intact to voice. IX, X: Palate elevates symmetrically. Phonation is normal.  KP:Dynloizm shrug 5/5. XII: tongue is midline without fasciculations. Motor: RUE and RLE with give way weakness poor and variable effort. Tone: is normal and bulk is normal Sensation- endorses diminished sensation on the right Diminished sensation to vibration on the right and splits midline on the forehead to vibration Coordination: FTN intact bilaterally, HKS: no ataxia in BLE.No drift.  Gait- deferred  Most Recent NIH 8    ASSESSMENT/PLAN  Travis [name] is a 58 y.o. male with history of  hypertension, hyperlipidemia, substance abuse, recent stroke aborted by TNK, anxiety, basilar artery aneurysm, GERD, depression and PNES who was admitted with acute onset right-sided weakness, dysarthria and right facial droop.  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 Code Stroke CT head No acute abnormality. ASPECTS 10.     CTA head & neck There is moderate atheromatous disease within the anterior and middle cerebral arteries, primarily involving the left M1 and A1 segments. There is also involvement of the M2 branches bilaterally. There is no large vessel occlusion evident. 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 MRI  No acute intracranial  abnormality 2D Echo EF 65-70% LDL 74 HgbA1c 5.4 VTE prophylaxis - Lovenox  No antithrombotic prior to admission, now on aspirin  81 mg daily and clopidogrel  75 mg daily for 3 weeks and then aspirin  alone. Therapy recommendations:  CIR Disposition:  Pending  Hx of Stroke/TIA 09/19/2023- MRI negative, received TNK  Diabetes Metformin  HgbA1 5.4  Hypertension Home meds:  Cardizem , metoprolol , clonidine  Stable Blood Pressure Goal: BP less than 220/110   Hyperlipidemia LDL 74, goal < 70 Add atorvastatin  40mg   Continue statin at discharge  Major depressive disorder/PTSD/schizoaffective disorder:  Continue home regimen  Risperdal  amitriptyline   Hospital day # 0  Patient seen and examined by NP/APP with MD. MD to update note as needed.   Jorene Last, DNP, FNP-BC [facility] Pager: [phone]  I have personally obtained history,examined this patient, reviewed notes, independently viewed imaging studies, participated in medical decision making and plan of care.ROS completed by me personally and pertinent positives fully documented  I have made any additions or clarifications directly to the above note. Agree with note above.  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  Mobilize out of bed.  Therapy consults.  Recommend aspirin  and Plavix  for 3 weeks then aspirin  alone given changes of  intracranial atherosclerosis..  Discussed with Dr.Gonfa   I personally spent a total of 50 minutes in the care of the patient today including getting/reviewing separately obtained history, performing a medically appropriate exam/evaluation, counseling and educating,  placing orders, referring and communicating with other health care professionals, documenting clinical information in the EHR, independently interpreting results, and coordinating care.        Eather Popp, MD Medical Director [facility] Pager: [phone] 09/27/2023 3:47 PM   To contact Stroke Continuity provider, please refer to [email or web address]. After hours, contact General Neurology

## 2023-09-28 DIAGNOSIS — E7800 Pure hypercholesterolemia, unspecified: Secondary | ICD-10-CM | POA: Diagnosis not present

## 2023-09-28 DIAGNOSIS — I10 Essential (primary) hypertension: Secondary | ICD-10-CM | POA: Diagnosis not present

## 2023-09-28 DIAGNOSIS — R2990 Unspecified symptoms and signs involving the nervous system: Secondary | ICD-10-CM | POA: Diagnosis not present

## 2023-09-28 DIAGNOSIS — G459 Transient cerebral ischemic attack, unspecified: Secondary | ICD-10-CM | POA: Diagnosis not present

## 2023-09-28 LAB — GLUCOSE, CAPILLARY: Glucose-Capillary: 125 mg/dL — ABNORMAL HIGH (ref 70–99)

## 2023-09-28 MED ORDER — ESCITALOPRAM OXALATE 10 MG PO TABS
20.0000 mg | ORAL_TABLET | Freq: Every day | ORAL | Status: DC
  Administered 2023-09-29: 20 mg via ORAL
  Filled 2023-09-28: qty 2

## 2023-09-28 MED ORDER — ASPIRIN 81 MG PO TBEC
81.0000 mg | DELAYED_RELEASE_TABLET | Freq: Every day | ORAL | Status: DC
  Administered 2023-09-28 – 2023-09-29 (×2): 81 mg via ORAL
  Filled 2023-09-28 (×2): qty 1

## 2023-09-28 MED ORDER — BENAZEPRIL HCL 20 MG PO TABS
20.0000 mg | ORAL_TABLET | Freq: Every day | ORAL | Status: DC
  Administered 2023-09-29: 20 mg via ORAL
  Filled 2023-09-28: qty 1

## 2023-09-28 NOTE — Progress Notes (Signed)
 Inpatient Rehab Admissions Coordinator:  Consult received. Discussed pt with Dr. Babs. Note workup has been negative for an acute neurological event. Pt does not appear to have medical necessity to warrant a CIR admission. Other rehab venues should be pursued. TOC made aware.    Tinnie Yvone Cohens, MS, CCC-SLP Admissions Coordinator [phone]

## 2023-09-28 NOTE — Progress Notes (Signed)
 PROGRESS NOTE  Travis [name] FMW:[id number] DOB: 14-Sep-1965   PCP: Scarlett [name], NP  Patient is from: Home.  DOA: 09/26/2023 LOS: 0  Chief complaints Chief Complaint  Patient presents with   Code Stroke     Brief Narrative / Interim history: 58 year old M with PMH of PE no longer on AC, recent TIA s/p TNK at [name], intracerebral aneurysm, obesity, remote seizure, substance abuse, DDD, anxiety, PTSD, HTN, HLD and GERD brought to ED as code stroke due to right-sided weakness and severe headache, and admitted for CVA workup.  Patient was hospitalized at [facility] from 7/18-7/23 with strokelike symptoms including dysarthria, ataxia, sensory loss and LUE weakness for which she had tenecteplase.  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  Patient was discharged home with DME and outpatient therapy referral.   In ED, stable vitals.  CMP and CBC without acute finding.  CT head without acute finding.  CT angio head and neck redemonstrated basilar aneurysm and also showed moderate left M1 and A1 atherosclerotic disease.  Neurology consulted.  MRI brain without acute finding.  TTE without significant finding.  Question about functional neurologic deficit.  Neurology recommended Plavix  and aspirin  for 3 weeks followed by aspirin  alone.  Therapy recommended CIR.  Subjective: Seen and examined earlier this morning.  No major events overnight or this morning.  Continues to endorse right-sided weakness and paresthesia in his right fingertips and foot.   Objective: Vitals:   09/27/23 2026 09/28/23 0033 09/28/23 0357 09/28/23 0733  BP: (!) 146/91 (!) 147/93 128/87 (!) 138/98  Pulse: 74 71 74 68  Resp: 18 18 17 19   Temp: 97.8 F (36.6 C) 98.3 F (36.8 C) 98.2 F (36.8 C) 98.4 F (36.9 C)  TempSrc:  Oral Oral Oral  SpO2: 97% 93% 94% 94%  Weight:      Height:         Examination:  GENERAL: No apparent distress.  Nontoxic. HEENT: MMM.  Vision and hearing grossly intact.  NECK: Supple.  No apparent JVD.  RESP:  No IWOB.  Fair aeration bilaterally. CVS:  RRR. Heart sounds normal.  ABD/GI/GU: BS+. Abd soft, NTND.  MSK/EXT:  Moves extremities. No apparent deformity. No edema.  SKIN: no apparent skin lesion or wound NEURO: Awake but not alert.  Oriented appropriately.  Speech clear. Cranial nerves II-XII grossly intact.  Motor 3+/5 in RUE and RLE and 5/5 elsewhere.  Subjective diminished light sensation on the right.  Patellar reflex symmetric.  Not able to assess pronator drift. PSYCH: Calm. Normal affect.   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  He was hospitalized at [facility] from 7/8-7/23 with strokelike symptoms for which she received TNK at [name] on 7/18.  His CVA workup at [name] unrevealing.  Presenting with right-sided weakness, numbness, headache.  CT head, CT angio head and neck and MRI brain without acute finding to explain patient's symptoms.  Patient denies history of migraine headache.  UDS positive for benzo and THC although patient denies benzo.  Denies alcohol.  Smokes about a pack a day.  TTE without significant finding.  LDL 74.  A1c 5.4%.  -Appreciate input by neurology-Plavix  and aspirin  for 3 weeks followed by aspirin  alone -Continue Lipitor 40 mg  daily. -PT/OT/SLP-recommended CIR.  CIR consulted -Encouraged smoking cessation.  Essential hypertension: Normotensive.  MRI without acute CVA.  No significant extraoral intracranial stenosis on CT angio head and neck. -Continue home metoprolol .   Tobacco use disorder: Reports smoking a pack a day. -Encouraged smoking cessation -Nicotine  patch   Major depressive  disorder/PTSD/schizoaffective disorder:  -Continue home Lexapro  and quetiapine   Headache: Unclear etiology.  No history of migraine.  Improved. -Tylenol  and Toradol  as needed.  Elevated AST: Mild.  CK normal.  Patient denies alcohol.  Resolved.  Chronic osteoarthritis -Pain meds as above -Continue home gabapentin  and Flexeril  as well   GERD: Continues PPIs  Class I obesity Body mass index is 33.23 kg/m.           DVT prophylaxis:  enoxaparin  (LOVENOX ) injection 40 mg Start: 09/27/23 1000  Code Status: Full code Family Communication: None at bedside Level of care: Telemetry Medical Status is: Observation The patient will require care spanning > 2 midnights and should be moved to inpatient because: TIA/strokelike symptoms with right hemiparesis and intractable headache   Final disposition: CIR?   35 minutes with more than 50% spent in reviewing records, counseling patient/family and coordinating care.   Sch Meds:  Scheduled Meds:  aspirin  EC  81 mg Oral Daily   atorvastatin   40 mg Oral Daily   clopidogrel   75 mg Oral Daily   enoxaparin  (LOVENOX ) injection  40 mg Subcutaneous Q24H   gabapentin   1,200 mg Oral TID   metoprolol  tartrate  25 mg Oral BID   nicotine   14 mg Transdermal Daily   QUEtiapine   400 mg Oral QHS   Continuous Infusions: PRN Meds:.acetaminophen  **OR** acetaminophen  (TYLENOL ) oral liquid 160 mg/5 mL **OR** acetaminophen , cyclobenzaprine , ketorolac , senna-docusate  Antimicrobials: Anti-infectives (From admission, onward)    None        I have personally reviewed the following labs and images: CBC: Recent Labs  Lab 09/26/23 1831 09/26/23 2255 09/27/23 1003  WBC 7.1 6.2 6.0  NEUTROABS 3.3  --   --   HGB 13.9 12.6* 14.4  HCT 43.8 38.1* 44.6  MCV 94.4 91.6 92.9  PLT 182 161 154   BMP &GFR Recent Labs  Lab 09/26/23 1831 09/26/23 2255 09/27/23 1003  NA 136  --  138  K 3.7  --  3.5  CL 101  --  106  CO2 26  --  24  GLUCOSE  105*  --  90  BUN 10  --  9  CREATININE 1.12 1.00 0.95  CALCIUM  8.5*  --  8.2*  MG  --   --  2.1   Estimated Creatinine Clearance: 111.8 mL/min (by C-G formula based on SCr of 0.95 mg/dL). Liver & Pancreas: Recent Labs  Lab 09/26/23 1831 09/27/23 1003  AST 56* 40  ALT 37 32  ALKPHOS 74 61  BILITOT 0.8 0.8  PROT 7.1 6.7  ALBUMIN 3.3* 3.0*   No results for input(s): LIPASE, AMYLASE in the last 168 hours. No results for input(s): AMMONIA in the last 168 hours. Diabetic: Recent Labs    09/27/23 0326  HGBA1C 5.4   Recent Labs  Lab 09/26/23 1742 09/28/23 0033  GLUCAP 111* 125*   Cardiac Enzymes: Recent Labs  Lab 09/27/23 1003  CKTOTAL 368   No results for input(s): PROBNP in the last 8760 hours. Coagulation Profile: No results for input(s): INR, PROTIME in the last 168 hours. Thyroid Function Tests: No results for input(s): TSH, T4TOTAL, FREET4, T3FREE, THYROIDAB in the last 72  hours. Lipid Profile: Recent Labs    09/27/23 0326  CHOL 112  HDL 25*  LDLCALC 74  TRIG 67  CHOLHDL 4.5   Anemia Panel: No results for input(s): VITAMINB12, FOLATE, FERRITIN, TIBC, IRON, RETICCTPCT in the last 72 hours. Urine analysis:    Component Value Date/Time   COLORURINE AMBER (A) 03/23/2021 2053   APPEARANCEUR CLEAR 03/23/2021 2053   LABSPEC 1.030 03/23/2021 2053   PHURINE 5.0 03/23/2021 2053   GLUCOSEU NEGATIVE 03/23/2021 2053   GLUCOSEU NEG mg/dL 90/76/7990 9952   HGBUR LARGE (A) 03/23/2021 2053   BILIRUBINUR NEGATIVE 03/23/2021 2053   BILIRUBINUR SMALL 08/16/2013 1351   KETONESUR NEGATIVE 03/23/2021 2053   PROTEINUR 100 (A) 03/23/2021 2053   UROBILINOGEN 0.2 04/05/2014 0127   NITRITE NEGATIVE 03/23/2021 2053   LEUKOCYTESUR NEGATIVE 03/23/2021 2053   Sepsis Labs: Invalid input(s): PROCALCITONIN, LACTICIDVEN  Microbiology: No results found for this or any previous visit (from the past 240 hours).  Radiology  Studies: ECHOCARDIOGRAM COMPLETE BUBBLE STUDY Result Date: 09/27/2023    ECHOCARDIOGRAM REPORT   Patient Name:   DEIGO ALONSO Date of Exam: 09/27/2023 Medical Rec #:  [id number]    Height:       72.0 in Accession #:    [id number]   Weight:       245.0 lb Date of Birth:  1965/05/16    BSA:          2.322 m Patient Age:    58 years     BP:           123/81 mmHg Patient Gender: M            HR:           75 bpm. Exam Location:  Inpatient Procedure: 2D Echo, Color Doppler, Cardiac Doppler, Saline Contrast Bubble Study            and Intracardiac Opacification Agent (Both Spectral and Color Flow            Doppler were utilized during procedure). Indications:    Stroke  History:        Patient has prior history of Echocardiogram examinations, most                 recent 03/25/2021. Pulmonary Embolus, Arrythmias:Tachycardia,                 Signs/Symptoms:Chest Pain and Shortness of Breath; Risk                 Factors:Current Smoker, Hypertension and GERD+.  Sonographer:    Logan Shove RDCS Referring Phys: [id number] CORTNEY E DE LA TORRE IMPRESSIONS  1. Left ventricular ejection fraction, by estimation, is 65 to 70%. The left ventricle has normal function. The left ventricle has no regional wall motion abnormalities. Left ventricular diastolic parameters are consistent with Grade I diastolic dysfunction (impaired relaxation).  2. Right ventricular systolic function is normal. The right ventricular size is normal. Tricuspid regurgitation signal is inadequate for assessing PA pressure.  3. The mitral valve is grossly normal. Trivial mitral valve regurgitation. No evidence of mitral stenosis.  4. The aortic valve is grossly normal. There is mild calcification of the aortic valve. Aortic valve regurgitation is not visualized. Aortic valve sclerosis is present, with no evidence of aortic valve stenosis.  5. Agitated saline contrast bubble study was negative, with no evidence of any interatrial shunt. Comparison(s): No significant  change from prior study. Conclusion(s)/Recommendation(s): No intracardiac source of embolism detected on this transthoracic  study. Consider a transesophageal echocardiogram to exclude cardiac source of embolism if clinically indicated. FINDINGS  Left Ventricle: Left ventricular ejection fraction, by estimation, is 65 to 70%. The left ventricle has normal function. The left ventricle has no regional wall motion abnormalities. Definity  contrast agent was given IV to delineate the left ventricular  endocardial borders. The left ventricular internal cavity size was normal in size. There is no left ventricular hypertrophy. Left ventricular diastolic parameters are consistent with Grade I diastolic dysfunction (impaired relaxation). Right Ventricle: The right ventricular size is normal. No increase in right ventricular wall thickness. Right ventricular systolic function is normal. Tricuspid regurgitation signal is inadequate for assessing PA pressure. Left Atrium: Left atrial size was normal in size. Right Atrium: Right atrial size was normal in size. Pericardium: There is no evidence of pericardial effusion. Presence of epicardial fat layer. Mitral Valve: The mitral valve is grossly normal. Trivial mitral valve regurgitation. No evidence of mitral valve stenosis. Tricuspid Valve: The tricuspid valve is grossly normal. Tricuspid valve regurgitation is not demonstrated. No evidence of tricuspid stenosis. Aortic Valve: The aortic valve is grossly normal. There is mild calcification of the aortic valve. Aortic valve regurgitation is not visualized. Aortic valve sclerosis is present, with no evidence of aortic valve stenosis. Pulmonic Valve: The pulmonic valve was grossly normal. Pulmonic valve regurgitation is not visualized. No evidence of pulmonic stenosis. Aorta: The aortic root and ascending aorta are structurally normal, with no evidence of dilitation. Venous: The inferior vena cava was not well visualized. IAS/Shunts:  The interatrial septum was not well visualized. Agitated saline contrast was given intravenously to evaluate for intracardiac shunting. Agitated saline contrast bubble study was negative, with no evidence of any interatrial shunt.  LEFT VENTRICLE PLAX 2D LVIDd:         6.40 cm      Diastology LVIDs:         3.10 cm      LV e' medial:    6.53 cm/s LV PW:         1.20 cm      LV E/e' medial:  11.1 LV IVS:        1.00 cm      LV e' lateral:   8.92 cm/s LVOT diam:     2.00 cm      LV E/e' lateral: 8.1 LVOT Area:     3.14 cm  LV Volumes (MOD) LV vol d, MOD A2C: 110.0 ml LV vol d, MOD A4C: 147.0 ml LV vol s, MOD A2C: 44.8 ml LV vol s, MOD A4C: 63.6 ml LV SV MOD A2C:     65.2 ml LV SV MOD A4C:     147.0 ml LV SV MOD BP:      72.0 ml RIGHT VENTRICLE RV Basal diam:  4.10 cm RV Mid diam:    4.10 cm RV S prime:     12.30 cm/s TAPSE (M-mode): 1.7 cm LEFT ATRIUM             Index LA diam:        3.90 cm 1.68 cm/m LA Vol (A2C):   50.6 ml 21.79 ml/m LA Vol (A4C):   76.2 ml 32.81 ml/m LA Biplane Vol: 64.2 ml 27.64 ml/m   AORTA Ao Root diam: 2.60 cm Ao Asc diam:  3.40 cm MITRAL VALVE MV Area (PHT): 4.17 cm    SHUNTS MV Decel Time: 182 msec    Systemic Diam: 2.00 cm MV E velocity: 72.30 cm/s MV A  velocity: 84.40 cm/s MV E/A ratio:  0.86 Darryle Decent MD Electronically signed by Darryle Decent MD Signature Date/Time: 09/27/2023/11:33:47 AM    Final       Trygve Thal T. Sargent Mankey [facility]  If 7PM-7AM, please contact night-coverage [email or web address] 09/28/2023, 10:53 AM

## 2023-09-29 ENCOUNTER — Other Ambulatory Visit (HOSPITAL_COMMUNITY): Payer: Self-pay

## 2023-09-29 DIAGNOSIS — I10 Essential (primary) hypertension: Secondary | ICD-10-CM | POA: Diagnosis not present

## 2023-09-29 DIAGNOSIS — K219 Gastro-esophageal reflux disease without esophagitis: Secondary | ICD-10-CM | POA: Diagnosis not present

## 2023-09-29 DIAGNOSIS — G459 Transient cerebral ischemic attack, unspecified: Secondary | ICD-10-CM | POA: Diagnosis not present

## 2023-09-29 DIAGNOSIS — F321 Major depressive disorder, single episode, moderate: Secondary | ICD-10-CM

## 2023-09-29 DIAGNOSIS — E7800 Pure hypercholesterolemia, unspecified: Secondary | ICD-10-CM | POA: Diagnosis not present

## 2023-09-29 MED ORDER — ATORVASTATIN CALCIUM 40 MG PO TABS
40.0000 mg | ORAL_TABLET | Freq: Every day | ORAL | 0 refills | Status: AC
  Filled 2023-09-29: qty 90, 90d supply, fill #0

## 2023-09-29 MED ORDER — CLOPIDOGREL BISULFATE 75 MG PO TABS
75.0000 mg | ORAL_TABLET | Freq: Every day | ORAL | 0 refills | Status: AC
  Filled 2023-09-29: qty 20, 20d supply, fill #0

## 2023-09-29 MED ORDER — SENNOSIDES-DOCUSATE SODIUM 8.6-50 MG PO TABS: 1.0000 | ORAL_TABLET | Freq: Two times a day (BID) | ORAL | Status: AC | PRN

## 2023-09-29 NOTE — Discharge Summary (Signed)
 Physician Discharge Summary  Travis [name] FMW:[id number] DOB: 27-May-1965 DOA: 09/26/2023  PCP: Scarlett Travis Caldron, NP  Admit date: 09/26/2023 Discharge date: 09/29/23  Admitted From: Home Disposition: Home Recommendations for Outpatient Follow-up:  Outpatient follow-up with PCP in 1 to 2 weeks Consider referral to psychiatry Check CMP and CBC at follow-up Please follow up on the following pending results: None  Home Health: HH PT, OT, SLP Equipment/Devices: Rollator.  Patient has bedside commode  Discharge Condition: Stable CODE STATUS: Full code Diet Orders (From admission, onward)     Start     Ordered   09/29/23 0000  Diet - low sodium heart healthy        09/29/23 0731   09/26/23 2254  Diet Heart Room service appropriate? Yes; Fluid consistency: Thin  Diet effective now       Question Answer Comment  Room service appropriate? Yes   Fluid consistency: Thin      09/26/23 2253             Follow-up Information     Travis [name], Travis Caldron, NP. Schedule an appointment as soon as possible for a visit in 1 week(s).   Contact information: [address] [place]  [name] [address] [phone]         [facility]. Schedule an appointment as soon as possible for a visit.   Specialty: Rehabilitation Contact information: [address] [place], [address] [place]  [address] [phone]        [facility] Transportation services Follow up.   Why: call 2-3 days in advance to schedule your first appointment Contact information: [phone]                Hospital course 58 year old M with PMH of PE no longer on [facility], recent TIA s/p TNK at [facility], intracerebral aneurysm, obesity, remote seizure, substance abuse, DDD, anxiety, PTSD, HTN, HLD and GERD brought to ED as code stroke due to right-sided weakness and severe headache, and admitted for CVA workup.   Patient was hospitalized at [facility] from 7/18-7/23 with strokelike  symptoms including dysarthria, ataxia, sensory loss and LUE weakness for which she had tenecteplase.  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  Patient was discharged home with DME and outpatient therapy referral.    In ED, stable vitals.  CMP and CBC without acute finding.  CT head without acute finding.  CT angio head and neck redemonstrated basilar aneurysm and also showed moderate left M1 and A1 atherosclerotic disease.  Neurology consulted.  MRI brain without acute finding.  TTE without significant finding.  Question about functional neurologic deficit.  Neurology recommended Plavix  and aspirin  for 3 weeks followed by aspirin  alone.  Therapy recommended inpatient rehab but did not qualify due to negative workup for acute neurologic events.  Patient is discharged home with home health and DME as above.  See individual problem list below for more.   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  He was hospitalized at [facility] from 7/8-7/23 with strokelike symptoms for which she received T[facility].  His CVA workup at [facility] unrevealing.  Presenting with right-sided weakness, numbness, headache.  CT head, CT angio head and neck and MRI brain without acute finding to explain patient's symptoms.  Patient denies history of migraine headache.  UDS positive for benzo and THC although patient denies benzo.  Denies alcohol.  Smokes about a pack a day.  TTE without significant finding.  LDL 74.  A1c 5.4%.  -Appreciate input by neurology-Plavix  and aspirin  for 3 weeks followed by aspirin  alone -Continue Lipitor 40 mg daily. - Therapy recommended CIR but did not qualify due to negative workup for acute neurologic events.  Patient is  discharged home with [facility] PT/OT/SLP and rollator. -Encouraged smoking cessation. -Consider ambulatory referral to psychiatry   Essential hypertension: Normotensive.  MRI without acute CVA.  No significant extraoral intracranial stenosis on CT angio head and neck. -Continue home metoprolol  and benazepril .   Tobacco use disorder: Reports smoking a pack a day. -Encouraged smoking cessation -Nicotine  patch   Major depressive disorder/PTSD/schizoaffective disorder:  -Continue home Lexapro  and quetiapine  -Consider ambulatory referral to psychiatry   Headache: Unclear etiology.  No history of migraine.  Resolved.   Elevated AST: Mild.  CK normal.  Patient denies alcohol.  Resolved.   Chronic osteoarthritis - Continue home meds.  Remote history of seizure disorder   GERD: Continues PPIs  Medication management -Patient no longer takes diltiazem , risperidone  and tramadol .  Hence discontinued.   Class I obesity Body mass index is 33.23 kg/m. -Encourage lifestyle change to lose weight           Consultations: Neurology  Time spent 35  minutes  Vital signs Vitals:   09/28/23 1603 09/28/23 2010 09/29/23 0008 09/29/23 0514  BP: (!) 148/93 (!) 131/94 (!) 154/92 (!) 146/92  Pulse: 76 96 79 78  Temp: 98.2 F (36.8 C) 98.4 F (36.9 C) 97.8 F (36.6 C) 97.7 F (36.5 C)  Resp: 17 16 18 18   Height:      Weight:      SpO2: 94% 98% 94% 95%  TempSrc: Oral Oral Oral Axillary  BMI (Calculated):         Discharge exam  GENERAL: No apparent distress.  Nontoxic. HEENT: MMM.  Vision and hearing grossly intact.  NECK: Supple.  No apparent JVD.  RESP:  No IWOB.  Fair aeration bilaterally. CVS:  RRR. Heart sounds normal.  ABD/GI/GU: BS+. Abd soft, NTND.  MSK/EXT:  Moves extremities. No apparent deformity. No edema.  SKIN: no apparent skin lesion or wound NEURO: Awake but not alert.  Oriented appropriately.  Speech clear. Cranial nerves II-XII grossly intact.  Motor 3+/5 in RUE and  RLE and 5/5 elsewhere.  Subjective diminished light sensation on the right.  Patellar reflex symmetric.  Not able to assess pronator drift. PSYCH: Calm. Normal affect.   Discharge Instructions Discharge Instructions     Ambulatory referral to Occupational Therapy   Complete by: As directed    Ambulatory referral to Physical Therapy   Complete by: As directed    Ambulatory referral to Speech Therapy   Complete by: As directed    Diet - low sodium heart healthy   Complete by: As directed    Discharge instructions   Complete by: As directed    It has been a pleasure taking care of you!  You were hospitalized due to strokelike symptoms.  However, your MRI did not show stroke or significant finding to explain your symptoms.  We have started you on Plavix , aspirin  and Lipitor to reduce your risk of stroke.  Take your medications as prescribed.  Reviewed your new medication list and the directions on your medications before you take them.  Please follow-up with  your primary care doctor in 1 to 2 weeks or sooner if needed   Take care,   Increase activity slowly   Complete by: As directed       Allergies as of 09/29/2023       Reactions   Blueberry Flavoring Agent (non-screening) Anaphylaxis   Vaccinium Angustifolium Anaphylaxis, Hives   Penicillins Hives, Itching, Other (See Comments)   Hallucinations. Has patient had a PCN reaction causing immediate rash, facial/tongue/throat swelling, SOB or lightheadedness with hypotension:YES Has patient had a PCN reaction causing severe rash involving mucus membranes or skin necrosis: NO Has patient had a PCN reaction that required hospitalization: yes Has patient had a PCN reaction occurring within the last 10 years: NO If all of the above answers are NO, then may proceed with Cephalosporin use. * Zosyn  x 1 03/24/21. No rxn noted.        Medication List     STOP taking these medications    diltiazem  240 MG 24 hr capsule Commonly known  as: CARDIZEM  CD   risperiDONE  0.5 MG tablet Commonly known as: RISPERDAL    traMADol  50 MG tablet Commonly known as: ULTRAM        TAKE these medications    aspirin  81 MG chewable tablet Chew 81 mg by mouth daily.   atorvastatin  40 MG tablet Commonly known as: LIPITOR Take 1 tablet (40 mg total) by mouth daily.   benazepril  20 MG tablet Commonly known as: LOTENSIN  Take 20 mg by mouth daily.   clopidogrel  75 MG tablet Commonly known as: PLAVIX  Take 1 tablet (75 mg total) by mouth daily.   cyclobenzaprine  10 MG tablet Commonly known as: FLEXERIL  Take 1 tablet (10 mg total) by mouth 3 (three) times daily as needed for muscle spasms.   escitalopram  20 MG tablet Commonly known as: LEXAPRO  Take 1 tablet (20 mg total) by mouth daily after breakfast.   famotidine  20 MG tablet Commonly known as: PEPCID  Take 1 tablet (20 mg total) by mouth daily as needed for heartburn or indigestion.   Fish Oil 1000 MG Caps Take 2,000 mg by mouth in the morning and at bedtime.   gabapentin  600 MG tablet Commonly known as: NEURONTIN  Take 1,200 mg by mouth 3 (three) times daily.   metoprolol  tartrate 100 MG tablet Commonly known as: LOPRESSOR  Take 50 mg by mouth 2 (two) times daily.   nicotine  14 mg/24hr patch Commonly known as: NICODERM CQ  - dosed in mg/24 hours Place 1 patch (14 mg total) onto the skin daily.   QUEtiapine  400 MG tablet Commonly known as: SEROQUEL  Take 400 mg by mouth at bedtime.   senna-docusate 8.6-50 MG tablet Commonly known as: Senokot-S Take 1-2 tablets by mouth 2 (two) times daily between meals as needed for mild constipation or moderate constipation.   Vitamin D (Ergocalciferol) 1.25 MG (50000 UNIT) Caps capsule Commonly known as: DRISDOL Take 50,000 Units by mouth every 7 (seven) days. Start taking on: September 30, 2023               Durable Medical Equipment  (From admission, onward)           Start     Ordered   09/29/23 [phone]  For home use  only DME 4 wheeled rolling walker with seat  Once       Comments: Bariatric size  Question:  Patient needs a walker to treat with the following condition  Answer:  Weakness   09/29/23 0853  Procedures/Studies:   ECHOCARDIOGRAM COMPLETE BUBBLE STUDY Result Date: 09/27/2023    ECHOCARDIOGRAM REPORT   Patient Name:   ELVIN MCCARTIN Date of Exam: 09/27/2023 Medical Rec #:  [id number]    Height:       72.0 in Accession #:    [id number]   Weight:       245.0 lb Date of Birth:  03-16-1965    BSA:          2.322 m Patient Age:    58 years     BP:           123/81 mmHg Patient Gender: M            HR:           75 bpm. Exam Location:  Inpatient Procedure: 2D Echo, Color Doppler, Cardiac Doppler, Saline Contrast Bubble Study            and Intracardiac Opacification Agent (Both Spectral and Color Flow            Doppler were utilized during procedure). Indications:    Stroke  History:        Patient has prior history of Echocardiogram examinations, most                 recent 03/25/2021. Pulmonary Embolus, Arrythmias:Tachycardia,                 Signs/Symptoms:Chest Pain and Shortness of Breath; Risk                 Factors:Current Smoker, Hypertension and GERD+.  Sonographer:    Logan Shove RDCS Referring Phys: [id number] CORTNEY E DE LA TORRE IMPRESSIONS  1. Left ventricular ejection fraction, by estimation, is 65 to 70%. The left ventricle has normal function. The left ventricle has no regional wall motion abnormalities. Left ventricular diastolic parameters are consistent with Grade I diastolic dysfunction (impaired relaxation).  2. Right ventricular systolic function is normal. The right ventricular size is normal. Tricuspid regurgitation signal is inadequate for assessing PA pressure.  3. The mitral valve is grossly normal. Trivial mitral valve regurgitation. No evidence of mitral stenosis.  4. The aortic valve is grossly normal. There is mild calcification of the aortic valve. Aortic valve  regurgitation is not visualized. Aortic valve sclerosis is present, with no evidence of aortic valve stenosis.  5. Agitated saline contrast bubble study was negative, with no evidence of any interatrial shunt. Comparison(s): No significant change from prior study. Conclusion(s)/Recommendation(s): No intracardiac source of embolism detected on this transthoracic study. Consider a transesophageal echocardiogram to exclude cardiac source of embolism if clinically indicated. FINDINGS  Left Ventricle: Left ventricular ejection fraction, by estimation, is 65 to 70%. The left ventricle has normal function. The left ventricle has no regional wall motion abnormalities. Definity  contrast agent was given IV to delineate the left ventricular  endocardial borders. The left ventricular internal cavity size was normal in size. There is no left ventricular hypertrophy. Left ventricular diastolic parameters are consistent with Grade I diastolic dysfunction (impaired relaxation). Right Ventricle: The right ventricular size is normal. No increase in right ventricular wall thickness. Right ventricular systolic function is normal. Tricuspid regurgitation signal is inadequate for assessing PA pressure. Left Atrium: Left atrial size was normal in size. Right Atrium: Right atrial size was normal in size. Pericardium: There is no evidence of pericardial effusion. Presence of epicardial fat layer. Mitral Valve: The mitral valve is grossly normal. Trivial mitral valve regurgitation. No evidence of mitral valve stenosis.  Tricuspid Valve: The tricuspid valve is grossly normal. Tricuspid valve regurgitation is not demonstrated. No evidence of tricuspid stenosis. Aortic Valve: The aortic valve is grossly normal. There is mild calcification of the aortic valve. Aortic valve regurgitation is not visualized. Aortic valve sclerosis is present, with no evidence of aortic valve stenosis. Pulmonic Valve: The pulmonic valve was grossly normal. Pulmonic  valve regurgitation is not visualized. No evidence of pulmonic stenosis. Aorta: The aortic root and ascending aorta are structurally normal, with no evidence of dilitation. Venous: The inferior vena cava was not well visualized. IAS/Shunts: The interatrial septum was not well visualized. Agitated saline contrast was given intravenously to evaluate for intracardiac shunting. Agitated saline contrast bubble study was negative, with no evidence of any interatrial shunt.  LEFT VENTRICLE PLAX 2D LVIDd:         6.40 cm      Diastology LVIDs:         3.10 cm      LV e' medial:    6.53 cm/s LV PW:         1.20 cm      LV E/e' medial:  11.1 LV IVS:        1.00 cm      LV e' lateral:   8.92 cm/s LVOT diam:     2.00 cm      LV E/e' lateral: 8.1 LVOT Area:     3.14 cm  LV Volumes (MOD) LV vol d, MOD A2C: 110.0 ml LV vol d, MOD A4C: 147.0 ml LV vol s, MOD A2C: 44.8 ml LV vol s, MOD A4C: 63.6 ml LV SV MOD A2C:     65.2 ml LV SV MOD A4C:     147.0 ml LV SV MOD BP:      72.0 ml RIGHT VENTRICLE RV Basal diam:  4.10 cm RV Mid diam:    4.10 cm RV S prime:     12.30 cm/s TAPSE (M-mode): 1.7 cm LEFT ATRIUM             Index LA diam:        3.90 cm 1.68 cm/m LA Vol (A2C):   50.6 ml 21.79 ml/m LA Vol (A4C):   76.2 ml 32.81 ml/m LA Biplane Vol: 64.2 ml 27.64 ml/m   AORTA Ao Root diam: 2.60 cm Ao Asc diam:  3.40 cm MITRAL VALVE MV Area (PHT): 4.17 cm    SHUNTS MV Decel Time: 182 msec    Systemic Diam: 2.00 cm MV E velocity: 72.30 cm/s MV A velocity: 84.40 cm/s MV E/A ratio:  0.86 Darryle Decent MD Electronically signed by Darryle Decent MD Signature Date/Time: 09/27/2023/11:33:47 AM    Final    MR BRAIN WO CONTRAST Result Date: 09/27/2023 EXAM: MRI BRAIN WITHOUT CONTRAST 09/27/2023 04:15:23 AM TECHNIQUE: Multiplanar multisequence MRI of the head/brain was performed without the administration of intravenous contrast. COMPARISON: None available. CLINICAL HISTORY: Transient ischemic attack (TIA). FINDINGS: BRAIN AND VENTRICLES: No acute  infarct. No intracranial hemorrhage. No mass. No midline shift. No hydrocephalus. The sella is unremarkable. Normal flow voids. Periventricular T2 hyperintensities are mildly advanced for age. ORBITS: No acute abnormality. SINUSES AND MASTOIDS: No acute abnormality. BONES AND SOFT TISSUES: Normal marrow signal. No acute soft tissue abnormality. IMPRESSION: 1. No acute intracranial abnormality. 2. Mildly advanced periventricular T2 hyperintensities for age. Electronically signed by: Lonni Necessary MD 09/27/2023 06:52 AM EDT RP Workstation: [id number]   CT ANGIO HEAD NECK W WO CM (CODE STROKE) Result Date: 09/26/2023 CLINICAL DATA:  Acute neurological deficit. EXAM: CT ANGIOGRAPHY HEAD AND NECK WITH CONTRAST TECHNIQUE: Multidetector CT imaging of the head and neck was performed using the standard protocol during bolus administration of intravenous contrast. Multiplanar CT image reconstructions and MIPs were obtained to evaluate the vascular anatomy. Carotid stenosis measurements (when applicable) are obtained utilizing NASCET criteria, using the distal internal carotid diameter as the denominator. RADIATION DOSE REDUCTION: This exam was performed according to the departmental dose-optimization program which includes automated exposure control, adjustment of the mA and/or kV according to patient size and/or use of iterative reconstruction technique. CONTRAST:  75mL OMNIPAQUE  IOHEXOL  350 MG/ML SOLN COMPARISON:  CT of the head dated September 26, 2023. FINDINGS: CTA NECK FINDINGS Aortic arch: Normal in caliber. Three vessel origin of the great arteries. The brachiocephalic artery is normal in caliber. Right carotid system: The common carotid and internal carotid arteries are normal in caliber and unremarkable. Left carotid system: There is mild calcific atheromatous disease within the common carotid artery with no flow-limiting stenosis. The internal carotid artery is normal in caliber and unremarkable. Vertebral  arteries: The vertebral arteries are codominant and normal in caliber throughout the respective courses. Skeleton: Mild degenerative changes throughout the cervical spine. Other neck: Negative. Upper chest: The visualized lung fields are clear. Review of the MIP images confirms the above findings CTA HEAD FINDINGS Anterior circulation: The cavernous and supraclinoid segments of the internal carotid arteries demonstrate mild stenosis, but no flow-limiting stenosis. There is venous contamination within the right cavernous sinus. There is irregular beading of the M1 and A1 branches of the left middle and anterior cerebral arteries, but there is no flow-limiting stenosis. There is also mild beading of the right M1 segment, also without flow-limiting stenosis. The M2 branches of both middle cerebral arteries demonstrate mild to moderate stenosis, but no large vessel occlusion. The anterior cerebral arteries also demonstrate mild to moderate atheromatous disease without large vessel occlusion. 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 The mid to distal basal artery is normal in caliber. The posterior cerebral arteries and cerebellar arteries are normal in caliber. There is no flow-limiting stenosis. Venous sinuses: Patent and unremarkable. Anatomic variants: None. Review of the MIP images confirms the above findings IMPRESSION: 1. There is moderate atheromatous disease within the anterior and middle cerebral arteries, primarily involving the left M1 and A1 segments. There is also involvement of the M2 branches bilaterally. There is no large vessel occlusion evident. 2. 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 3. Mild calcific atheromatous disease within the left carotid artery without flow-limiting stenosis. Estimated stenosis is less than 10%. These results were called by telephone at the time of interpretation on 09/26/2023 at 6:10  pm to provider ERIC [facility] , who verbally acknowledged these results. Electronically Signed   By: Evalene Coho M.D.   On: 09/26/2023 18:23   CT HEAD CODE STROKE WO CONTRAST Result Date: 09/26/2023 CLINICAL DATA:  Code stroke. Acute neurological deficit with left facial droop and right weakness. EXAM: CT HEAD WITHOUT CONTRAST TECHNIQUE: Contiguous axial images were obtained from the base of the skull through the vertex without intravenous contrast. RADIATION DOSE REDUCTION: This exam was performed according to the departmental dose-optimization program which includes automated exposure control, adjustment of the mA and/or kV according to patient size and/or use of iterative reconstruction technique. COMPARISON:  CT of the head dated September 01, 2015. FINDINGS: Brain: Mild generalized cerebral volume  loss. Mild periventricular white matter disease. No evidence of hemorrhage, mass, acute cortical infarct or hydrocephalus. Vascular: Mild calcific atheromatous disease. Skull: Intact and unremarkable. Sinuses/Orbits: Clear paranasal sinuses and mastoid air cells. Normal orbits. IMPRESSION: 1. Age-related atrophy and mild cerebral white matter disease. 2. ASPECTS is 10. 3. These results were communicated to Dr. Lindzen at 5:56 pm on 09/26/2023 by text page via the [facility] messaging system. Electronically Signed   By: Evalene Coho M.D.   On: 09/26/2023 17:59       The results of significant diagnostics from this hospitalization (including imaging, microbiology, ancillary and laboratory) are listed below for reference.     Microbiology: No results found for this or any previous visit (from the past 240 hours).   Labs:  CBC: Recent Labs  Lab 09/26/23 1831 09/26/23 2255 09/27/23 1003  WBC 7.1 6.2 6.0  NEUTROABS 3.3  --   --   HGB 13.9 12.6* 14.4  HCT 43.8 38.1* 44.6  MCV 94.4 91.6 92.9  PLT 182 161 154   BMP &GFR Recent Labs  Lab 09/26/23 1831 09/26/23 2255 09/27/23 1003  NA 136  --   138  K 3.7  --  3.5  CL 101  --  106  CO2 26  --  24  GLUCOSE 105*  --  90  BUN 10  --  9  CREATININE 1.12 1.00 0.95  CALCIUM  8.5*  --  8.2*  MG  --   --  2.1   Estimated Creatinine Clearance: 111.8 mL/min (by C-G formula based on SCr of 0.95 mg/dL). Liver & Pancreas: Recent Labs  Lab 09/26/23 1831 09/27/23 1003  AST 56* 40  ALT 37 32  ALKPHOS 74 61  BILITOT 0.8 0.8  PROT 7.1 6.7  ALBUMIN 3.3* 3.0*   No results for input(s): LIPASE, AMYLASE in the last 168 hours. No results for input(s): AMMONIA in the last 168 hours. Diabetic: Recent Labs    09/27/23 0326  HGBA1C 5.4   Recent Labs  Lab 09/26/23 1742 09/28/23 0033  GLUCAP 111* 125*   Cardiac Enzymes: Recent Labs  Lab 09/27/23 1003  CKTOTAL 368   No results for input(s): PROBNP in the last 8760 hours. Coagulation Profile: No results for input(s): INR, PROTIME in the last 168 hours. Thyroid Function Tests: No results for input(s): TSH, T4TOTAL, FREET4, T3FREE, THYROIDAB in the last 72 hours. Lipid Profile: Recent Labs    09/27/23 0326  CHOL 112  HDL 25*  LDLCALC 74  TRIG 67  CHOLHDL 4.5   Anemia Panel: No results for input(s): VITAMINB12, FOLATE, FERRITIN, TIBC, IRON, RETICCTPCT in the last 72 hours. Urine analysis:    Component Value Date/Time   COLORURINE AMBER (A) 03/23/2021 2053   APPEARANCEUR CLEAR 03/23/2021 2053   LABSPEC 1.030 03/23/2021 2053   PHURINE 5.0 03/23/2021 2053   GLUCOSEU NEGATIVE 03/23/2021 2053   GLUCOSEU NEG mg/dL 90/76/7990 9952   HGBUR LARGE (A) 03/23/2021 2053   BILIRUBINUR NEGATIVE 03/23/2021 2053   BILIRUBINUR SMALL 08/16/2013 1351   KETONESUR NEGATIVE 03/23/2021 2053   PROTEINUR 100 (A) 03/23/2021 2053   UROBILINOGEN 0.2 04/05/2014 0127   NITRITE NEGATIVE 03/23/2021 2053   LEUKOCYTESUR NEGATIVE 03/23/2021 2053   Sepsis Labs: Invalid input(s): PROCALCITONIN, LACTICIDVEN   SIGNED:  Charna Neeb T Oprah Camarena, MD  [facility] 09/29/2023, 10:49 AM

## 2023-09-29 NOTE — Progress Notes (Signed)
 MD at the bedside

## 2023-09-29 NOTE — Progress Notes (Signed)
 Occupational Therapy Treatment Patient Details Name: Travis [name] MRN: [id number] DOB: 1965-10-11 Today's Date: 09/29/2023   History of present illness Pt is a 58 y.o. male who presented 09/26/23 with acute onset of R-sided weakness and headache. CT without contrast showed no acute findings. 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 MRI negative for acute intracranial abnormality. Of note, pt was recently admitted to [facility] in [place] with a code stroke, had presented there with slurred speech, ataxia and confusion, and received TNK at the time. Subsequent imaging showed intracerebral aneurysm and was discharged home only last week. PMH: anxiety disorder, degenerative disc disease, PTSD, PE, seizure disorder, HTN, HLD, GERD, morbid obesity, polysubstance abuse, spinal stenosis, suicide attempt   OT comments  Pt reports he was feeling better until 4 am. Pt with inconsistent movement patterns RU/LE throughout session. PT overall S with mobility and min A with LB ADL @ RW level. Began R hand strengthening exercises. Pt does not want HH therefore recommend follow up with outpt OT. Acute OT to follow.       If plan is discharge home, recommend the following:  Assistance with cooking/housework;Assist for transportation   Equipment Recommendations  Tub/shower bench    Recommendations for Other Services      Precautions / Restrictions Precautions Precautions: Fall Restrictions Weight Bearing Restrictions Per Provider Order: No       Mobility Bed Mobility Overal bed mobility: Modified Independent                  Transfers Overall transfer level: Modified independent Equipment used: Rollator (4 wheels)                     Balance Overall balance assessment: Needs assistance Sitting-balance support: No upper extremity supported, Feet supported Sitting balance-Leahy Scale: Good     Standing balance  support: During functional activity, No upper extremity supported Standing balance-Leahy Scale: Fair                             ADL either performed or assessed with clinical judgement   ADL Overall ADL's : Needs assistance/impaired Eating/Feeding: Modified independent   Grooming: Set up;Sitting   Upper Body Bathing: Set up;Sitting   Lower Body Bathing: Minimal assistance;Sit to/from stand   Upper Body Dressing : Set up;Supervision/safety;Sitting   Lower Body Dressing: Minimal assistance;Sit to/from stand   Toilet Transfer: Supervision/safety;Ambulation;Rolling walker (2 wheels)   Toileting- Clothing Manipulation and Hygiene: Supervision/safety       Functional mobility during ADLs: Supervision/safety;Rolling walker (2 wheels)      Extremity/Trunk Assessment Upper Extremity Assessment Upper Extremity Assessment: Generalized weakness;Right hand dominant RUE Deficits / Details: inconsistent movemetn RUE; able to lift  to touch top of head, then unable to lift; able to squeeze with grip 3+/5 then unable to oppose thumb to digits. AROM overall WFL at times; States he has trouble using his RUE and that his hand has some numbness RUE Coordination: decreased fine motor;decreased gross motor LUE Deficits / Details: WFL   Lower Extremity Assessment Lower Extremity Assessment: Defer to PT evaluation        Vision   Vision Assessment?: Wears glasses for reading Additional Comments: States vision has improved; no diplopia. Able to read signs aroudn the room without diffiuclty; no over/undershooting noted   Perception Perception Perception: Within Functional Limits   Praxis Praxis Praxis: [facility]  Communication Communication Communication: No apparent difficulties   Cognition Arousal: Alert Behavior During Therapy: Flat affect Cognition: No family/caregiver present to determine baseline (most likely baseline)                               Following  commands: Intact        Cueing   Cueing Techniques: Verbal cues  Exercises Exercises: Other exercises Other Exercises Other Exercises: squeeze ball issued for grip and pinch strengthening    Shoulder Instructions       General Comments      Pertinent Vitals/ Pain       Pain Assessment Pain Assessment: Faces Faces Pain Scale: Hurts even more Pain Location: left lower back Pain Descriptors / Indicators: Discomfort, Grimacing, Guarding, Sore, Constant Pain Intervention(s): Limited activity within patient's tolerance  Home Living                                          Prior Functioning/Environment              Frequency  Min 2X/week        Progress Toward Goals  OT Goals(current goals can now be found in the care plan section)  Progress towards OT goals: Progressing toward goals  Acute Rehab OT Goals Patient Stated Goal: to not be discharged OT Goal Formulation: With patient Time For Goal Achievement: 10/11/23 Potential to Achieve Goals: Good ADL Goals Pt Will Perform Grooming: with supervision;standing Pt Will Perform Lower Body Dressing: with supervision;sit to/from stand Pt Will Transfer to Toilet: with supervision;ambulating Pt Will Perform Toileting - Clothing Manipulation and hygiene: with supervision;sit to/from stand Additional ADL Goal #1: Pt will be ind with gaze stabilization exercises to improve vestibular function. (Goal  DC 7/28)  Plan      Co-evaluation                 AM-PAC OT 6 Clicks Daily Activity     Outcome Measure   Help from another person eating meals?: None Help from another person taking care of personal grooming?: A Little Help from another person toileting, which includes using toliet, bedpan, or urinal?: A Little Help from another person bathing (including washing, rinsing, drying)?: A Little Help from another person to put on and taking off regular upper body clothing?: A Little Help from  another person to put on and taking off regular lower body clothing?: A Little 6 Click Score: 19    End of Session Equipment Utilized During Treatment: Gait belt;Rolling walker (2 wheels)  OT Visit Diagnosis: Unsteadiness on feet (R26.81);Other abnormalities of gait and mobility (R26.89);Muscle weakness (generalized) (M62.81);Pain;Low vision, both eyes (H54.2);Other symptoms and signs involving the nervous system (R29.898) Pain - part of body:  (back)   Activity Tolerance Patient tolerated treatment well   Patient Left in bed;with call bell/phone within reach   Nurse Communication Mobility status        Time: 9091-9076 OT Time Calculation (min): 15 min  Charges: OT General Charges $OT Visit: 1 Visit OT Treatments $Self Care/Home Management : 8-22 mins  Kreg Sink, OT/L   Acute OT Clinical Specialist Acute Rehabilitation Services Pager [phone] Office [phone]   [facility] 09/29/2023, 9:32 AM

## 2023-09-29 NOTE — Plan of Care (Signed)
  Problem: Ischemic Stroke/TIA Tissue Perfusion: Goal: Complications of ischemic stroke/TIA will be minimized Outcome: Progressing   Problem: Coping: Goal: Will verbalize positive feelings about self Outcome: Progressing   Problem: Self-Care: Goal: Ability to communicate needs accurately will improve Outcome: Progressing   Problem: Health Behavior/Discharge Planning: Goal: Ability to manage health-related needs will improve Outcome: Progressing   Problem: Coping: Goal: Level of anxiety will decrease Outcome: Progressing   Problem: Pain Managment: Goal: General experience of comfort will improve and/or be controlled Outcome: Progressing   Problem: Safety: Goal: Ability to remain free from injury will improve Outcome: Progressing

## 2023-09-29 NOTE — Discharge Instructions (Signed)
 Dear Travis [name],   Congratulations for your interest in quitting smoking!  Find a program that suits you best: when you want to quit, how you need support, where you live, and how you like to learn.    If you're ready to get started TODAY, consider scheduling a visit through [email or web address].  Appointments are available from 8am to 8pm, Monday to Friday.   Most health insurance plans will cover some level of tobacco cessation visits and medications.    Additional Resources: [employer] are also available to help you quit & provide the support you'll need. Many programs are available in both [place] and Spanish and have a long history of successfully helping people get off and stay off tobacco.    Quit Smoking Apps:  quitSTART at [employer].de QuitGuide?at ForgetParking.dk Online education and resources: Smokefree  at [employer].gov Free Telephone Coaching: QuitNow,  Call 1-800-QUIT-NOW (9307246901) or Text- Ready to (272)237-4046 *Quitline [place] has teamed up with Medicaid to offer a free 14 week program    Vaping- Want to Quit? Free 24/7 support. Call [name]  [name], [name], [name], [name], [name]  [name]

## 2023-09-29 NOTE — Progress Notes (Signed)
 Physical Therapy Treatment Patient Details Name: Travis [name] MRN: [id number] DOB: 01-Jun-1965 Today's Date: 09/29/2023   History of Present Illness Pt is a 58 y.o. male who presented 09/26/23 with acute onset of R-sided weakness and headache. CT without contrast showed no acute findings. 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 MRI negative for acute intracranial abnormality. Of note, pt was recently admitted to [facility] in [place] with a code stroke, had presented there with slurred speech, ataxia and confusion, and received TNK at the time. Subsequent imaging showed intracerebral aneurysm and was discharged home only last week. PMH: anxiety disorder, degenerative disc disease, PTSD, PE, seizure disorder, HTN, HLD, GERD, morbid obesity, polysubstance abuse, spinal stenosis, suicide attempt    PT Comments  Pt reports no improvement in subjective R sided weakness and sensation deficits; although, overall he mobilizing much better than initial evaluation. Pt able to transition out of bed and walk to bathroom with Rollator without physical difficulty. Ambulating an additional ~60 ft in hallway, but limited in distance due to chronic back pain. Pt with preference for OPPT vs HHPT. Updated recommendations.    If plan is discharge home, recommend the following: Direct supervision/assist for medications management;Direct supervision/assist for financial management;Assist for transportation;Assistance with cooking/housework;Help with stairs or ramp for entrance   Can travel by private vehicle        Equipment Recommendations  None recommended by PT    Recommendations for Other Services       Precautions / Restrictions Precautions Precautions: Fall Restrictions Weight Bearing Restrictions Per Provider Order: No     Mobility  Bed Mobility Overal bed mobility: Modified Independent             General bed mobility comments:  HOB slightly elevated    Transfers Overall transfer level: Modified independent Equipment used: Rollator (4 wheels)                    Ambulation/Gait Ambulation/Gait assistance: Supervision Gait Distance (Feet): 60 Feet Assistive device: Rollator (4 wheels) Gait Pattern/deviations: Step-through pattern, Decreased stride length, Trunk flexed, Decreased step length - left Gait velocity: reduced     General Gait Details: Slow and steady pace, left foot slightly externally rotated with smaller step length, no gross imbalance   Stairs             Wheelchair Mobility     Tilt Bed    Modified Rankin (Stroke Patients Only) Modified Rankin (Stroke Patients Only) Pre-Morbid Rankin Score: No symptoms Modified Rankin: Moderately severe disability     Balance Overall balance assessment: Needs assistance Sitting-balance support: No upper extremity supported, Feet supported Sitting balance-Leahy Scale: Good     Standing balance support: During functional activity, No upper extremity supported Standing balance-Leahy Scale: Fair                              Hotel manager: No apparent difficulties  Cognition Arousal: Alert Behavior During Therapy: Flat affect   PT - Cognitive impairments: No family/caregiver present to determine baseline                       PT - Cognition Comments: Follows all commands Following commands: Intact      Cueing Cueing Techniques: Verbal cues  Exercises      General Comments        Pertinent Vitals/Pain Pain  Assessment Pain Assessment: 0-10 Pain Score: 9  Pain Location: left lower back Pain Descriptors / Indicators: Discomfort, Grimacing, Guarding, Sore, Constant Pain Intervention(s): Limited activity within patient's tolerance, Monitored during session, Patient requesting pain meds-RN notified    Home Living                          Prior Function             PT Goals (current goals can now be found in the care plan section) Acute Rehab PT Goals Patient Stated Goal: to improve PT Goal Formulation: With patient Time For Goal Achievement: 10/11/23 Potential to Achieve Goals: Good Progress towards PT goals: Progressing toward goals    Frequency    Min 2X/week      PT Plan      Co-evaluation              AM-PAC PT 6 Clicks Mobility   Outcome Measure  Help needed turning from your back to your side while in a flat bed without using bedrails?: None Help needed moving from lying on your back to sitting on the side of a flat bed without using bedrails?: None Help needed moving to and from a bed to a chair (including a wheelchair)?: None Help needed standing up from a chair using your arms (e.g., wheelchair or bedside chair)?: None Help needed to walk in hospital room?: A Little Help needed climbing 3-5 steps with a railing? : A Little 6 Click Score: 22    End of Session Equipment Utilized During Treatment: Gait belt Activity Tolerance: Patient limited by pain Patient left: with call bell/phone within reach;in bed Nurse Communication: Mobility status PT Visit Diagnosis: Unsteadiness on feet (R26.81);Other abnormalities of gait and mobility (R26.89);Muscle weakness (generalized) (M62.81);Difficulty in walking, not elsewhere classified (R26.2);Other symptoms and signs involving the nervous system (R29.898);Dizziness and giddiness (R42);Pain Pain - Right/Left:  (back) Pain - part of body:  (back)     Time: 9184-9164 PT Time Calculation (min) (ACUTE ONLY): 20 min  Charges:    $Therapeutic Activity: 8-22 mins PT General Charges $$ ACUTE PT VISIT: 1 Visit                     Aleck Daring, PT, DPT Acute Rehabilitation Services Office [phone]    Alayne ONEIDA Daring 09/29/2023, 8:44 AM

## 2023-09-29 NOTE — TOC Transition Note (Signed)
 Transition of Care [facility]) - Discharge Note   Patient Details  Name: Travis [name] MRN: [id number] Date of Birth: 1965/10/07  [facility]) CM/SW Contact:  Andrez JULIANNA George, RN Phone Number: 09/29/2023, 9:52 AM   Clinical Narrative:     Pt is discharging home with outpatient therapy through [facility]. Information on the AVS for pt to call and schedule the first appointment.  Rollator recommended. Pt told therapies he didn't want it. He has  a regular walker at home. Pt also states he has a BSC at home. Information for his medicaid transportation added to the AVS.  Pt has transportation home today.  Final next level of care: OP Rehab Barriers to Discharge: No Barriers Identified   Patient Goals and CMS Choice            Discharge Placement                       Discharge Plan and Services Additional resources added to the After Visit Summary for                                       Social Drivers of Health (SDOH) Interventions SDOH Screenings   Food Insecurity: Food Insecurity Present (09/27/2023)  Housing: Low Risk  (09/27/2023)  Transportation Needs: Unmet Transportation Needs (09/27/2023)  Utilities: Not At Risk (09/27/2023)  Alcohol Screen: Low Risk  (01/22/2023)  Depression (PHQ2-9): High Risk (01/22/2023)  Financial Resource Strain: Patient Declined (05/19/2023)   Received from [facility]  Physical Activity: Inactive (01/09/2023)   Received from [facility]  Social Connections: Moderately Integrated (01/09/2023)   Received from [facility]  Stress: Stress Concern Present (09/20/2023)   Received from [facility]  Tobacco Use: High Risk (09/26/2023)     Readmission Risk Interventions     No data to display

## 2024-01-28 ENCOUNTER — Other Ambulatory Visit: Payer: Self-pay

## 2024-01-28 ENCOUNTER — Other Ambulatory Visit: Payer: Self-pay | Admitting: Nurse Practitioner

## 2024-01-28 DIAGNOSIS — M5416 Radiculopathy, lumbar region: Secondary | ICD-10-CM

## 2024-01-28 DIAGNOSIS — I10 Essential (primary) hypertension: Secondary | ICD-10-CM

## 2024-01-28 MED ORDER — GABAPENTIN 600 MG PO TABS
1200.0000 mg | ORAL_TABLET | Freq: Three times a day (TID) | ORAL | 0 refills | Status: AC
  Filled 2024-01-28 (×2): qty 180, 30d supply, fill #0

## 2024-01-28 MED ORDER — CYCLOBENZAPRINE HCL 10 MG PO TABS
10.0000 mg | ORAL_TABLET | Freq: Three times a day (TID) | ORAL | 0 refills | Status: AC | PRN
  Filled 2024-01-28 (×2): qty 90, 30d supply, fill #0

## 2024-01-28 MED ORDER — METOPROLOL TARTRATE 100 MG PO TABS
50.0000 mg | ORAL_TABLET | Freq: Two times a day (BID) | ORAL | 0 refills | Status: AC
  Filled 2024-01-28: qty 60, 60d supply, fill #0

## 2024-01-28 MED ORDER — BENAZEPRIL HCL 20 MG PO TABS
20.0000 mg | ORAL_TABLET | Freq: Every day | ORAL | 0 refills | Status: AC
  Filled 2024-01-28 (×2): qty 30, 30d supply, fill #0

## 2024-01-28 NOTE — Progress Notes (Signed)
 Acute Video Visit    Virtual Visit Consent:   Travis Mamone Carano Jr., you are scheduled for a virtual visit with a [facility] provider today.     Just as with appointments in the office, your consent must be obtained to participate.  Your consent will be active for this visit and any virtual visit you may have with one of our providers in the next 365 days.     If you have a MyChart account, a copy of this consent can be sent to you electronically.  All virtual visits are billed to your insurance company just like a traditional visit in the office.    If the connection with a video visit is poor, the visit may have to be switched to a telephone visit.  With either a video or telephone visit, we are not always able to ensure that we have a secure connection.     I need to obtain your verbal consent now.   Are you willing to proceed with your visit today?    Travis Moose Stella Raddle. has provided verbal consent on 01/28/2024 for a virtual visit (video or telephone).   Lauraine Kitty, FNP  Date: 01/28/2024 12:08 PM  Subjective:     Patient ID: Travis Moose Stella Raddle., male    DOB: 1965/12/12, 58 y.o.   MRN: [id number]  I, Lauraine Kitty, connected with  Travis [name].  ([id number], [birth year]) on 01/28/24 at  by a video-enabled telemedicine application and verified that I am speaking with the correct person using two identifiers.   Location: [facility]  Provider: Virtual Visit Location Provider: Home Office   I discussed the limitations of evaluation and management by telemedicine and the availability of in person appointments. The patient expressed understanding and agreed to proceed.     HPI Travis Nelson Gutzmer Jr. is a 58 y.o. who identifies as a male who was assigned male at birth, and is being seen today for assistance with medication refills. He is a patient of Ronal Jenkins Houseman at the [facility], awaiting transition to [facility] next month.   Medical history  is significant for HTN, TIA, GERD, depression and substance abuse.       Assessment & Plan:   Follow up with PCP in 2 weeks at [facility]   1. Lumbar radicular pain (Primary)  - cyclobenzaprine  (FLEXERIL ) 10 MG tablet; Take 1 tablet (10 mg total) by mouth 3 (three) times daily as needed for muscle spasms.  Dispense: 90 tablet; Refill: 0 - gabapentin  (NEURONTIN ) 600 MG tablet; Take 2 tablets (1,200 mg total) by mouth 3 (three) times daily.  Dispense: 180 tablet; Refill: 0  2. Hypertension, unspecified type  - benazepril  (LOTENSIN ) 20 MG tablet; Take 1 tablet (20 mg total) by mouth daily.  Dispense: 30 tablet; Refill: 0 - metoprolol  tartrate (LOPRESSOR ) 100 MG tablet; Take 0.5 tablets (50 mg total) by mouth 2 (two) times daily.  Dispense: 60 tablet; Refill: 0     Follow Up Instructions: I discussed the assessment and treatment plan with the patient. The patient was provided an opportunity to ask questions and all were answered. The patient agreed with the plan and demonstrated an understanding of the instructions.  A copy of instructions were sent to the patient via MyChart unless otherwise noted below.    The patient was advised to call back or seek an in-person evaluation if the symptoms worsen or if the condition fails to improve as anticipated.  Lauraine Kitty, FNP  **Disclaimer: This note may have been dictated with voice recognition software. Similar sounding words can inadvertently be transcribed and this note may contain transcription errors which may not have been corrected upon publication of note.**
